# Patient Record
Sex: Female | Born: 1935
Health system: Southern US, Community
[De-identification: ages and names within clinical notes are randomized; demographics above are authoritative.]

## PROBLEM LIST (undated history)

## (undated) DIAGNOSIS — N12 Tubulo-interstitial nephritis, not specified as acute or chronic: Secondary | ICD-10-CM

## (undated) DIAGNOSIS — D472 Monoclonal gammopathy: Secondary | ICD-10-CM

## (undated) DIAGNOSIS — N189 Chronic kidney disease, unspecified: Secondary | ICD-10-CM

## (undated) DIAGNOSIS — R0600 Dyspnea, unspecified: Secondary | ICD-10-CM

## (undated) DIAGNOSIS — B1920 Unspecified viral hepatitis C without hepatic coma: Secondary | ICD-10-CM

## (undated) DIAGNOSIS — J449 Chronic obstructive pulmonary disease, unspecified: Secondary | ICD-10-CM

## (undated) DIAGNOSIS — D649 Anemia, unspecified: Secondary | ICD-10-CM

## (undated) DIAGNOSIS — E040 Nontoxic diffuse goiter: Secondary | ICD-10-CM

## (undated) DIAGNOSIS — Z923 Personal history of irradiation: Secondary | ICD-10-CM

## (undated) DIAGNOSIS — I6529 Occlusion and stenosis of unspecified carotid artery: Secondary | ICD-10-CM

## (undated) DIAGNOSIS — I10 Essential (primary) hypertension: Secondary | ICD-10-CM

## (undated) HISTORY — PX: ABDOMINAL HYSTERECTOMY: SHX81

## (undated) HISTORY — DX: Chronic kidney disease, unspecified: N18.9

---

## 2000-10-09 ENCOUNTER — Ambulatory Visit (HOSPITAL_COMMUNITY): Admission: RE | Admit: 2000-10-09 | Discharge: 2000-10-09 | Payer: Self-pay | Admitting: Internal Medicine

## 2000-10-10 ENCOUNTER — Encounter: Payer: Self-pay | Admitting: Internal Medicine

## 2000-11-01 ENCOUNTER — Ambulatory Visit (HOSPITAL_COMMUNITY): Admission: RE | Admit: 2000-11-01 | Discharge: 2000-11-01 | Payer: Self-pay | Admitting: Internal Medicine

## 2001-02-04 ENCOUNTER — Ambulatory Visit (HOSPITAL_COMMUNITY): Admission: RE | Admit: 2001-02-04 | Discharge: 2001-02-04 | Payer: Self-pay | Admitting: Internal Medicine

## 2001-02-04 ENCOUNTER — Encounter: Payer: Self-pay | Admitting: Internal Medicine

## 2001-05-03 ENCOUNTER — Ambulatory Visit (HOSPITAL_COMMUNITY): Admission: RE | Admit: 2001-05-03 | Discharge: 2001-05-03 | Payer: Self-pay | Admitting: Internal Medicine

## 2001-05-03 ENCOUNTER — Encounter: Payer: Self-pay | Admitting: Internal Medicine

## 2001-06-24 ENCOUNTER — Encounter: Payer: Self-pay | Admitting: Internal Medicine

## 2001-06-24 ENCOUNTER — Ambulatory Visit (HOSPITAL_COMMUNITY): Admission: RE | Admit: 2001-06-24 | Discharge: 2001-06-24 | Payer: Self-pay | Admitting: Internal Medicine

## 2002-05-07 ENCOUNTER — Ambulatory Visit (HOSPITAL_COMMUNITY): Admission: RE | Admit: 2002-05-07 | Discharge: 2002-05-07 | Payer: Self-pay | Admitting: Internal Medicine

## 2002-05-07 ENCOUNTER — Encounter: Payer: Self-pay | Admitting: Internal Medicine

## 2003-05-29 ENCOUNTER — Ambulatory Visit (HOSPITAL_COMMUNITY): Admission: RE | Admit: 2003-05-29 | Discharge: 2003-05-29 | Payer: Self-pay | Admitting: Internal Medicine

## 2003-08-31 ENCOUNTER — Ambulatory Visit (HOSPITAL_COMMUNITY): Admission: RE | Admit: 2003-08-31 | Discharge: 2003-08-31 | Payer: Self-pay | Admitting: Internal Medicine

## 2003-09-01 ENCOUNTER — Ambulatory Visit (HOSPITAL_COMMUNITY): Admission: RE | Admit: 2003-09-01 | Discharge: 2003-09-01 | Payer: Self-pay | Admitting: Internal Medicine

## 2004-05-23 ENCOUNTER — Ambulatory Visit (HOSPITAL_COMMUNITY): Admission: RE | Admit: 2004-05-23 | Discharge: 2004-05-23 | Payer: Self-pay | Admitting: Internal Medicine

## 2005-05-25 ENCOUNTER — Ambulatory Visit (HOSPITAL_COMMUNITY): Admission: RE | Admit: 2005-05-25 | Discharge: 2005-05-25 | Payer: Self-pay | Admitting: Internal Medicine

## 2006-05-28 ENCOUNTER — Ambulatory Visit (HOSPITAL_COMMUNITY): Admission: RE | Admit: 2006-05-28 | Discharge: 2006-05-28 | Payer: Self-pay | Admitting: Internal Medicine

## 2007-05-30 ENCOUNTER — Ambulatory Visit (HOSPITAL_COMMUNITY): Admission: RE | Admit: 2007-05-30 | Discharge: 2007-05-30 | Payer: Self-pay | Admitting: Internal Medicine

## 2007-11-18 ENCOUNTER — Ambulatory Visit (HOSPITAL_COMMUNITY): Admission: RE | Admit: 2007-11-18 | Discharge: 2007-11-18 | Payer: Self-pay | Admitting: Internal Medicine

## 2008-06-01 ENCOUNTER — Ambulatory Visit (HOSPITAL_COMMUNITY): Admission: RE | Admit: 2008-06-01 | Discharge: 2008-06-01 | Payer: Self-pay | Admitting: Internal Medicine

## 2008-12-22 ENCOUNTER — Ambulatory Visit (HOSPITAL_COMMUNITY): Admission: RE | Admit: 2008-12-22 | Discharge: 2008-12-22 | Payer: Self-pay | Admitting: Internal Medicine

## 2008-12-31 ENCOUNTER — Ambulatory Visit (HOSPITAL_COMMUNITY): Admission: RE | Admit: 2008-12-31 | Discharge: 2008-12-31 | Payer: Self-pay | Admitting: Cardiology

## 2009-06-02 ENCOUNTER — Ambulatory Visit (HOSPITAL_COMMUNITY): Admission: RE | Admit: 2009-06-02 | Discharge: 2009-06-02 | Payer: Self-pay | Admitting: Internal Medicine

## 2010-06-03 ENCOUNTER — Ambulatory Visit (HOSPITAL_COMMUNITY)
Admission: RE | Admit: 2010-06-03 | Discharge: 2010-06-03 | Payer: Self-pay | Source: Home / Self Care | Attending: Internal Medicine | Admitting: Internal Medicine

## 2010-07-03 ENCOUNTER — Encounter: Payer: Self-pay | Admitting: Internal Medicine

## 2010-10-28 NOTE — Procedures (Signed)
NAME:  Lisa [name], Lisa [name]                      ACCOUNT NO.:  [id number]   MEDICAL RECORD NO.:  [id number]                   PATIENT TYPE:  OUT   LOCATION:  RAD                                  FACILITY:  [facility]   PHYSICIAN:  Edward L. Luan Pulling, M.D.             DATE OF BIRTH:  15-Feb-1935   DATE OF PROCEDURE:  DATE OF DISCHARGE:  08/31/2003                              PULMONARY FUNCTION TEST   FINDINGS:  1. Spirometry shows a severe ventilatory defect with evidence of air-flow     obstruction.  2. Lung volumes show a moderate degree of restrictive change.  3. DLCO is severely reduced.  4. Arterial blood gasses are normal.  5. There is no significant bronchodilator effect.      ___________________________________________                                            Jasper Loser. Luan Pulling, M.D.   [facility]/MEDQ  D:  09/03/2003  T:  09/03/2003  Job:  [id number]   cc:   Tesfaye D. Legrand Rams, M.D.  [address]  [place]  [place] [address]  Fax: [phone]

## 2011-02-12 ENCOUNTER — Emergency Department (HOSPITAL_COMMUNITY)
Admission: EM | Admit: 2011-02-12 | Discharge: 2011-02-12 | Disposition: A | Discharge status: Home / Self Care | Payer: Medicare Other | Attending: Emergency Medicine | Admitting: Emergency Medicine

## 2011-02-12 ENCOUNTER — Emergency Department (HOSPITAL_COMMUNITY): Payer: Medicare Other

## 2011-02-12 DIAGNOSIS — J441 Chronic obstructive pulmonary disease with (acute) exacerbation: Secondary | ICD-10-CM | POA: Insufficient documentation

## 2011-02-12 DIAGNOSIS — I10 Essential (primary) hypertension: Secondary | ICD-10-CM | POA: Insufficient documentation

## 2011-02-12 DIAGNOSIS — F17200 Nicotine dependence, unspecified, uncomplicated: Secondary | ICD-10-CM | POA: Insufficient documentation

## 2011-02-12 HISTORY — DX: Chronic obstructive pulmonary disease, unspecified: J44.9

## 2011-02-12 HISTORY — DX: Essential (primary) hypertension: I10

## 2011-02-12 MED ORDER — PREDNISONE 10 MG PO TABS: 20.0000 mg | ORAL_TABLET | Freq: Every day | ORAL | Status: AC

## 2011-02-12 MED ORDER — IPRATROPIUM BROMIDE 0.02 % IN SOLN
0.5000 mg | Freq: Once | RESPIRATORY_TRACT | Status: AC
  Administered 2011-02-12: 0.5 mg via RESPIRATORY_TRACT
  Filled 2011-02-12: qty 2.5

## 2011-02-12 MED ORDER — PREDNISONE 20 MG PO TABS
60.0000 mg | ORAL_TABLET | Freq: Once | ORAL | Status: AC
  Administered 2011-02-12: 60 mg via ORAL
  Filled 2011-02-12: qty 3
  Filled 2011-02-12: qty 1

## 2011-02-12 MED ORDER — ALBUTEROL SULFATE (5 MG/ML) 0.5% IN NEBU
2.5000 mg | INHALATION_SOLUTION | Freq: Once | RESPIRATORY_TRACT | Status: AC
  Administered 2011-02-12: 2.5 mg via RESPIRATORY_TRACT
  Filled 2011-02-12: qty 0.5

## 2011-02-12 MED ORDER — ALBUTEROL SULFATE HFA 108 (90 BASE) MCG/ACT IN AERS
2.0000 | INHALATION_SPRAY | Freq: Once | RESPIRATORY_TRACT | Status: AC
  Administered 2011-02-12: 2 via RESPIRATORY_TRACT
  Filled 2011-02-12: qty 6.7

## 2011-02-12 NOTE — ED Notes (Signed)
Notified nurse of bp

## 2011-02-12 NOTE — ED Notes (Signed)
Pt states she has copd. Has been sob x 3 days. Worse today

## 2011-02-12 NOTE — ED Notes (Signed)
C/o SOA with even the slightest of effort such as ambulating slowly.  Began several days ago with "cold like" symptoms.  Respirations are nonlabored--expiratory wheezes posteriorly both lung fields---No swelling of lower extremities

## 2011-02-12 NOTE — ED Provider Notes (Signed)
Scribed for Mervin Kung, MD, the patient was seen in room [facility]07/[facility]07. This chart was scribed by [employer]. The patient's care started at 10:47  CSN: [id number] Arrival date & time: 02/12/2011 10:40 AM  Chief Complaint  Patient presents with  . Shortness of Breath   HPI Lisa [name] is a 76 y.o. female with a history of HTN and COPD, who presents to the Emergency Department complaining of worsening shortness of breath, onset 02/09/2011. Reports non productive cough, questionable fevers and resolved headache. Denies nausea, vomiting, chest pain, vomiting, rash or swelling in legs. There are no other associated symptoms and no other alleviating or aggravating factors.  PCP is Dr Legrand Rams  HPI ELEMENTS:  Onset: 02/09/2011 Duration: 3 days  Timing: constant Quality: worsening  Modifying factors: aggravated by activity Context:  as above  Associated symptoms:    Past Medical History  Diagnosis Date  . COPD (chronic obstructive pulmonary disease)   . Hypertension    MEDICATIONS:  Previous Medications   No medications on file     ALLERGIES:  Allergies as of 02/12/2011  . (No Known Allergies)      History reviewed. No pertinent past surgical history.  No family history on file.  History  Substance Use Topics  . Smoking status: Current Everyday Smoker  . Smokeless tobacco: Not on file  . Alcohol Use: No      Review of Systems  Physical Exam  BP 231/89  Temp(Src) 98.6 F (37 C) (Oral)  Resp 32  Ht 5\' 8"  (1.727 m)  Wt 135 lb (61.236 kg)  BMI 20.53 kg/m2  SpO2 99%  Physical Exam  Constitutional: She is oriented to person, place, and time. She appears well-developed and well-nourished. No distress.  HENT:  Head: Normocephalic and atraumatic.  Mouth/Throat: Oropharynx is clear and moist. No oropharyngeal exudate.  Eyes: Conjunctivae and EOM are normal. Pupils are equal, round, and reactive to light.  Neck: Normal range of motion.  Cardiovascular:  Normal rate, regular rhythm and normal heart sounds.   No murmur heard. Pulmonary/Chest: Effort normal. No respiratory distress. She has wheezes (moderate bilateral expitory). She has no rales.  Abdominal: Soft. Bowel sounds are normal. She exhibits no distension. There is no tenderness. There is no rebound and no guarding.  Musculoskeletal: Normal range of motion. She exhibits no edema and no tenderness.  Neurological: She is alert and oriented to person, place, and time. No cranial nerve deficit. Coordination normal.  Skin: Skin is warm and dry. No rash noted. No erythema.  Psychiatric: She has a normal mood and affect. Her behavior is normal.    ED Course  Procedures  OTHER DATA REVIEWED: Nursing notes, vital signs, and past medical records reviewed.    DIAGNOSTIC STUDIES: Oxygen Saturation is 99% on room air, normal by my interpretation.    LABS / RADIOLOGY:  Dg Chest 2 View  02/12/2011  *RADIOLOGY REPORT*  Clinical Data: Shortness of breath.  CHEST - 2 VIEW  Comparison: Chest x-ray 11/18/2007.  Findings: The cardiac silhouette, mediastinal and hilar contours are within normal limits and stable.  The lungs demonstrate mild emphysematous changes but no acute pulmonary findings.  No pleural effusion.  The bony thorax is intact.  IMPRESSION: Stable mild emphysematous changes but no acute pulmonary findings.  Original Report Authenticated By: P. Kalman Jewels, M.D.    ED COURSE / COORDINATION OF CARE: 12:40 - EDMD examined patient and ordered the following Orders Placed This Encounter  Procedures  . DG  Chest 2 View  14:59 - EDMD rechecked patient. Patient is significantly improved after breathing treatment. Wheezing is significantly reduced. Lab results discussed and discharge instructions given.  MDM:   WHEEZING IMPROVED SIG WITH NEBULIZER TX IN ED ALSO PREDNISONE STARTED. PATIENT FEELS READY TO GO HOME. CXR NEG FOR PNEUMONIA.   IMPRESSION: Diagnoses that have been ruled out:    Diagnoses that are still under consideration:  Final diagnoses:    PLAN:  Home The patient is to return the emergency department if there is any worsening of symptoms. I have reviewed the discharge instructions with the PATIENT   CONDITION ON DISCHARGE: IMPROVED.   MEDICATIONS GIVEN IN THE E.D. Medications - No data to display  DISCHARGE MEDICATIONS: New Prescriptions   No medications on file    SCRIBE ATTESTATION:   I personally performed the services described in this documentation, which was scribed in my presence. The recorded information has been reviewed and considered. Mervin Kung, MD       Mervin Kung, MD 02/12/11 [phone]

## 2011-02-12 NOTE — ED Notes (Addendum)
Dr. Rogene Houston notified of B/P 210/103--Pt. Asymptomatic--states she hasn't taken her b/p RX today.

## 2011-02-12 NOTE — ED Notes (Signed)
Assisted up to bathroom---became very SOA--back to carrier via wheelchair--Pulse ox 90%--placed on 02 at 2 liters---02 level came back up to high 90's.

## 2011-05-10 ENCOUNTER — Other Ambulatory Visit (HOSPITAL_COMMUNITY): Payer: Self-pay | Admitting: Internal Medicine

## 2011-05-10 DIAGNOSIS — Z139 Encounter for screening, unspecified: Secondary | ICD-10-CM

## 2011-06-08 ENCOUNTER — Ambulatory Visit (HOSPITAL_COMMUNITY)
Admission: RE | Admit: 2011-06-08 | Discharge: 2011-06-08 | Disposition: A | Discharge status: Home / Self Care | Payer: Medicare Other | Source: Ambulatory Visit | Attending: Internal Medicine | Admitting: Internal Medicine

## 2011-06-08 DIAGNOSIS — Z139 Encounter for screening, unspecified: Secondary | ICD-10-CM

## 2011-06-08 DIAGNOSIS — Z1231 Encounter for screening mammogram for malignant neoplasm of breast: Secondary | ICD-10-CM | POA: Insufficient documentation

## 2011-08-11 ENCOUNTER — Emergency Department (HOSPITAL_COMMUNITY)
Admission: EM | Admit: 2011-08-11 | Discharge: 2011-08-11 | Disposition: A | Discharge status: Home / Self Care | Payer: Medicare Other | Attending: Emergency Medicine | Admitting: Emergency Medicine

## 2011-08-11 ENCOUNTER — Other Ambulatory Visit: Payer: Self-pay

## 2011-08-11 ENCOUNTER — Emergency Department (HOSPITAL_COMMUNITY): Payer: Medicare Other

## 2011-08-11 ENCOUNTER — Encounter (HOSPITAL_COMMUNITY): Payer: Self-pay | Admitting: *Deleted

## 2011-08-11 DIAGNOSIS — F17200 Nicotine dependence, unspecified, uncomplicated: Secondary | ICD-10-CM | POA: Insufficient documentation

## 2011-08-11 DIAGNOSIS — R05 Cough: Secondary | ICD-10-CM | POA: Insufficient documentation

## 2011-08-11 DIAGNOSIS — I10 Essential (primary) hypertension: Secondary | ICD-10-CM | POA: Insufficient documentation

## 2011-08-11 DIAGNOSIS — R0602 Shortness of breath: Secondary | ICD-10-CM | POA: Insufficient documentation

## 2011-08-11 DIAGNOSIS — R059 Cough, unspecified: Secondary | ICD-10-CM | POA: Insufficient documentation

## 2011-08-11 DIAGNOSIS — J441 Chronic obstructive pulmonary disease with (acute) exacerbation: Secondary | ICD-10-CM | POA: Insufficient documentation

## 2011-08-11 DIAGNOSIS — Z79899 Other long term (current) drug therapy: Secondary | ICD-10-CM | POA: Insufficient documentation

## 2011-08-11 LAB — DIFFERENTIAL
Eosinophils Relative: 0 % (ref 0–5)
Lymphocytes Relative: 22 % (ref 12–46)
Monocytes Absolute: 0.4 10*3/uL (ref 0.1–1.0)
Monocytes Relative: 6 % (ref 3–12)
Neutro Abs: 4.5 10*3/uL (ref 1.7–7.7)

## 2011-08-11 LAB — CBC
HCT: 39.5 % (ref 36.0–46.0)
Hemoglobin: 13.3 g/dL (ref 12.0–15.0)
MCHC: 33.7 g/dL (ref 30.0–36.0)
MCV: 88 fL (ref 78.0–100.0)
RDW: 13.4 % (ref 11.5–15.5)
WBC: 6.3 10*3/uL (ref 4.0–10.5)

## 2011-08-11 LAB — TROPONIN I: Troponin I: <0.3 ng/mL (ref <0.30)

## 2011-08-11 LAB — BASIC METABOLIC PANEL
BUN: 23 mg/dL (ref 6–23)
CO2: 28 mEq/L (ref 19–32)
Chloride: 104 mEq/L (ref 96–112)
Creatinine, Ser: 0.83 mg/dL (ref 0.50–1.10)
GFR calc Af Amer: 79 mL/min — ABNORMAL LOW (ref >90)
Potassium: 3.3 mEq/L — ABNORMAL LOW (ref 3.5–5.1)

## 2011-08-11 MED ORDER — LOSARTAN POTASSIUM-HCTZ 100-25 MG PO TABS: 1.0000 | ORAL_TABLET | Freq: Every day | ORAL | Status: DC

## 2011-08-11 MED ORDER — IPRATROPIUM BROMIDE 0.02 % IN SOLN
1.0000 mg | Freq: Once | RESPIRATORY_TRACT | Status: AC
  Administered 2011-08-11: 1 mg via RESPIRATORY_TRACT
  Filled 2011-08-11 (×2): qty 2.5

## 2011-08-11 MED ORDER — ALBUTEROL SULFATE (5 MG/ML) 0.5% IN NEBU
10.0000 mg | INHALATION_SOLUTION | Freq: Once | RESPIRATORY_TRACT | Status: AC
  Administered 2011-08-11: 10 mg via RESPIRATORY_TRACT
  Filled 2011-08-11: qty 2

## 2011-08-11 MED ORDER — LOSARTAN POTASSIUM 50 MG PO TABS
100.0000 mg | ORAL_TABLET | Freq: Once | ORAL | Status: AC
  Administered 2011-08-11: 100 mg via ORAL
  Filled 2011-08-11: qty 2

## 2011-08-11 MED ORDER — HYDROCHLOROTHIAZIDE 25 MG PO TABS
25.0000 mg | ORAL_TABLET | Freq: Once | ORAL | Status: AC
  Administered 2011-08-11: 25 mg via ORAL
  Filled 2011-08-11: qty 1

## 2011-08-11 MED ORDER — ALBUTEROL SULFATE HFA 108 (90 BASE) MCG/ACT IN AERS: 1.0000 | INHALATION_SPRAY | Freq: Four times a day (QID) | RESPIRATORY_TRACT | Status: DC | PRN

## 2011-08-11 MED ORDER — ALBUTEROL SULFATE (5 MG/ML) 0.5% IN NEBU
5.0000 mg | INHALATION_SOLUTION | Freq: Once | RESPIRATORY_TRACT | Status: AC
  Administered 2011-08-11: 5 mg via RESPIRATORY_TRACT
  Filled 2011-08-11: qty 1

## 2011-08-11 MED ORDER — ALBUTEROL SULFATE (5 MG/ML) 0.5% IN NEBU: 2.5000 mg | INHALATION_SOLUTION | Freq: Once | RESPIRATORY_TRACT | Status: DC

## 2011-08-11 MED ORDER — PREDNISONE 50 MG PO TABS: 50.0000 mg | ORAL_TABLET | Freq: Every day | ORAL | Status: AC

## 2011-08-11 MED ORDER — SODIUM CHLORIDE 0.9 % IV SOLN
INTRAVENOUS | Status: DC
  Administered 2011-08-11: 500 mL via INTRAVENOUS

## 2011-08-11 MED ORDER — METHYLPREDNISOLONE SODIUM SUCC 125 MG IJ SOLR: 125.0000 mg | Freq: Once | INTRAMUSCULAR | Status: DC

## 2011-08-11 NOTE — ED Notes (Signed)
Patient with no complaints at this time. Respirations even and unlabored. Skin warm/dry. Discharge instructions reviewed with patient at this time. Patient given opportunity to voice concerns/ask questions. IV removed per policy and band-aid applied to site. Patient discharged at this time and left Emergency Department via wheelchair.  

## 2011-08-11 NOTE — ED Notes (Signed)
Patient seen by pcp on Tuesday for shortness of breath. Was given zpack. States that shortness of breath has gotten worse.

## 2011-08-11 NOTE — ED Provider Notes (Signed)
Patient turned over to me from Dr. Thurnell Garbe. Patient was treated with albuterol.  Patient is feeling better now after treatments.  Physical Exam  BP 209/85  Pulse 70  Resp 20  Ht 5\' 8"  (1.727 m)  Wt 135 lb (61.236 kg)  BMI 20.53 kg/m2  SpO2 100%  Physical Exam On my exam her lungs are essentially clear to auscultation with a very faint wheeze at end expiration. She is able to speak in full sentences without any difficulty ED Course  Procedures  MDM patient is feeling better now after treatments. She'll be given a dose of his blood pressure medications. Patient also be discharged home on a course of steroids.     Kathalene Frames, MD 08/11/11 [phone]

## 2011-08-11 NOTE — ED Provider Notes (Signed)
History     CSN: [id number]  Arrival date & time 08/11/11  0456   First MD Initiated Contact with Patient 08/11/11 [phone]      Chief Complaint  Patient presents with  . Shortness of Breath    The history is provided by the patient, the EMS personnel and a relative. History Limited By: urgent need for intervention.  Pt was seen at 0525.  Per pt, family and EMS, pt c/o gradual onset and worsening of persistent SOB, wheezing and cough x3 days.  Describes her symptoms as "my COPD."  Pt was eval by her PMD 2 days ago for same, given z-pak for her cough.  Pt states she has continued to use her home MDI and nebs without relief.  EMS gave nebs and IV solumedrol en route with partial relief of symptoms.  Pt endorses she has not taken any of her meds today.  Denies CP/palpitations, no fevers, no back pain, no N/V/D, no abd pain.    PMD:  Dr. Legrand Rams Past Medical History  Diagnosis Date  . COPD (chronic obstructive pulmonary disease)   . Hypertension     Past Surgical History  Procedure Date  . Abdominal hysterectomy     History  Substance Use Topics  . Smoking status: Current Everyday Smoker    Types: Cigarettes  . Smokeless tobacco: Not on file  . Alcohol Use: No    Review of Systems  Unable to perform ROS: Other    Allergies  Review of patient's allergies indicates no known allergies.  Home Medications   Current Outpatient Rx  Name Route Sig Dispense Refill  . IPRATROPIUM-ALBUTEROL 18-103 MCG/ACT IN AERO Inhalation Inhale 2 puffs into the lungs 4 (four) times daily.    Marland Kitchen DILTIAZEM HCL ER COATED BEADS 120 MG PO CP24 Oral Take 120 mg by mouth daily.      . IPRATROPIUM-ALBUTEROL 0.5-2.5 (3) MG/3ML IN SOLN Nebulization Take 3 mLs by nebulization 4 (four) times daily.    Marland Kitchen LABETALOL HCL 300 MG PO TABS Oral Take 300 mg by mouth 2 (two) times daily.      Marland Kitchen LOSARTAN POTASSIUM-HCTZ 100-25 MG PO TABS Oral Take 1 tablet by mouth daily.      Marland Kitchen SIMVASTATIN 20 MG PO TABS Oral Take 20 mg by  mouth daily.    . IPRATROPIUM BROMIDE 0.02 % IN SOLN Nebulization Take 500 mcg by nebulization every 4 (four) hours as needed. FOR SHORTNESS OF BREATH        BP 244/117  Pulse 77  Resp 32  Ht 5\' 8"  (1.727 m)  Wt 135 lb (61.236 kg)  BMI 20.53 kg/m2  SpO2 100%  Physical Exam 0530: Physical examination:  Nursing notes reviewed; Vital signs and O2 SAT reviewed;  Constitutional: Well developed, Well nourished, Uncomfortable appearing; Head:  Normocephalic, atraumatic; Eyes: EOMI, PERRL, No scleral icterus; ENMT: Mouth and pharynx normal, Mucous membranes dry; Neck: Supple, Full range of motion, No lymphadenopathy; Cardiovascular: Regular rate and rhythm, No murmur, rub, or gallop; Respiratory: Breath sounds diminished & equal bilaterally with scattered insp/exp wheezes bilat, +audible wheezing, speaking in long phrases, sitting upright, Normal respiratory effort/excursion; Chest: Nontender, Movement normal; Abdomen: Soft, Nontender, Nondistended, Normal bowel sounds; Extremities: Pulses normal, No tenderness, No edema, No calf edema or asymmetry.; Neuro: AA&Ox3, Major CN grossly intact. No facial droop, speech clear.  No gross focal motor deficits in extremities.; Skin: Color normal, Warm, Dry, no rash.    ED Course  Procedures    MDM  MDM Reviewed: nursing note, vitals and previous chart Interpretation: ECG, x-ray and labs    Date: 08/11/2011  Rate: 76  Rhythm: normal sinus rhythm  QRS Axis: normal  Intervals: normal  ST/T Wave abnormalities: nonspecific ST/T changes inf-lat leads, artifact  Conduction Disutrbances:none  Narrative Interpretation:  [facility]  Old EKG Reviewed: none available    Dg Chest Portable 1 View 08/11/2011  *RADIOLOGY REPORT*  Clinical Data: Shortness of breath.  PORTABLE CHEST - 1 VIEW  Comparison: Chest radiograph performed 02/12/2011  Findings: The lungs are well-aerated and clear.  There is no evidence of focal opacification, pleural effusion or pneumothorax.  Pulmonary vascularity is at the upper limits of normal.  The cardiomediastinal silhouette is within normal limits.  No acute osseous abnormalities are seen.  IMPRESSION: No acute cardiopulmonary process seen.  Original Report Authenticated By: Santa Lighter, M.D.   Results for orders placed during the hospital encounter of 08/11/11  TROPONIN I      Component Value Range   Troponin I <0.30  <0.30 (ng/mL)  PRO B NATRIURETIC PEPTIDE      Component Value Range   Pro B Natriuretic peptide (BNP) 750.0 (*) 0 - 125 (pg/mL)  BASIC METABOLIC PANEL      Component Value Range   Sodium 141  135 - 145 (mEq/L)   Potassium 3.3 (*) 3.5 - 5.1 (mEq/L)   Chloride 104  96 - 112 (mEq/L)   CO2 28  19 - 32 (mEq/L)   Glucose, Bld 109 (*) 70 - 99 (mg/dL)   BUN 23  6 - 23 (mg/dL)   Creatinine, Ser 0.83  0.50 - 1.10 (mg/dL)   Calcium 9.4  8.4 - 10.5 (mg/dL)   GFR calc non Af Amer 68 (*) >90 (mL/min)   GFR calc Af Amer 79 (*) >90 (mL/min)  CBC      Component Value Range   WBC 6.3  4.0 - 10.5 (K/uL)   RBC 4.49  3.87 - 5.11 (MIL/uL)   Hemoglobin 13.3  12.0 - 15.0 (g/dL)   HCT 39.5  36.0 - 46.0 (%)   MCV 88.0  78.0 - 100.0 (fL)   MCH 29.6  26.0 - 34.0 (pg)   MCHC 33.7  30.0 - 36.0 (g/dL)   RDW 13.4  11.5 - 15.5 (%)   Platelets 140 (*) 150 - 400 (K/uL)  DIFFERENTIAL      Component Value Range   Neutrophils Relative 71  43 - 77 (%)   Neutro Abs 4.5  1.7 - 7.7 (K/uL)   Lymphocytes Relative 22  12 - 46 (%)   Lymphs Abs 1.4  0.7 - 4.0 (K/uL)   Monocytes Relative 6  3 - 12 (%)   Monocytes Absolute 0.4  0.1 - 1.0 (K/uL)   Eosinophils Relative 0  0 - 5 (%)   Eosinophils Absolute 0.0  0.0 - 0.7 (K/uL)   Basophils Relative 0  0 - 1 (%)   Basophils Absolute 0.0  0.0 - 0.1 (K/uL)  LACTIC ACID, PLASMA      Component Value Range   Lactic Acid, Venous 0.8  0.5 - 2.2 (mmol/L)  PROCALCITONIN      Component Value Range   Procalcitonin <0.10      7:30 AM:  Pt's continuous neb in place.  States she is starting to  "feel better."  Lungs continue with insp and exp wheezes bilat, no further audible wheezing.  Continues to sit upright, speaking in sentences.  Requesting her morning BP meds.  Will  need re-eval after neb regarding d/c vs admit.  Dx testing d/w pt and family.  Questions answered.  Verb understanding.  Sign out to Dr. Tomi Bamberger.        Alfonzo Feller, DO 08/11/11 1529

## 2011-08-11 NOTE — ED Notes (Signed)
Pt provided a bedside commode. Assisted pt to commode.

## 2011-08-11 NOTE — Discharge Instructions (Signed)
Arterial Hypertension Arterial hypertension (high blood pressure) is a condition of elevated pressure in your blood vessels. Hypertension over a long period of time is a risk factor for strokes, heart attacks, and heart failure. It is also the leading cause of kidney (renal) failure.  CAUSES   In Adults -- Over 90% of all hypertension has no known cause. This is called essential or primary hypertension. In the other 10% of people with hypertension, the increase in blood pressure is caused by another disorder. This is called secondary hypertension. Important causes of secondary hypertension are:   Heavy alcohol use.   Obstructive sleep apnea.   Hyperaldosterosim (Conn's syndrome).   Steroid use.   Chronic kidney failure.   Hyperparathyroidism.   Medications.   Renal artery stenosis.   Pheochromocytoma.   Cushing's disease.   Coarctation of the aorta.   Scleroderma renal crisis.   Licorice (in excessive amounts).   Drugs (cocaine, methamphetamine).  Your caregiver can explain any items above that apply to you.  In Children -- Secondary hypertension is more common and should always be considered.   Pregnancy -- Few women of childbearing age have high blood pressure. However, up to 10% of them develop hypertension of pregnancy. Generally, this will not harm the woman. It may be a sign of 3 complications of pregnancy: preeclampsia, HELLP syndrome, and eclampsia. Follow up and control with medication is necessary.  SYMPTOMS   This condition normally does not produce any noticeable symptoms. It is usually found during a routine exam.   Malignant hypertension is a late problem of high blood pressure. It may have the following symptoms:   Headaches.   Blurred vision.   End-organ damage (this means your kidneys, heart, lungs, and other organs are being damaged).   Stressful situations can increase the blood pressure. If a person with normal blood pressure has their blood  pressure go up while being seen by their caregiver, this is often termed "white coat hypertension." Its importance is not known. It may be related with eventually developing hypertension or complications of hypertension.   Hypertension is often confused with mental tension, stress, and anxiety.  DIAGNOSIS  The diagnosis is made by 3 separate blood pressure measurements. They are taken at least 1 week apart from each other. If there is organ damage from hypertension, the diagnosis may be made without repeat measurements. Hypertension is usually identified by having blood pressure readings:  Above 140/90 mmHg measured in both arms, at 3 separate times, over a couple weeks.   Over 130/80 mmHg should be considered a risk factor and may require treatment in patients with diabetes.  Blood pressure readings over 120/80 mmHg are called "pre-hypertension" even in non-diabetic patients. To get a true blood pressure measurement, use the following guidelines. Be aware of the factors that can alter blood pressure readings.  Take measurements at least 1 hour after caffeine.   Take measurements 30 minutes after smoking and without any stress. This is another reason to quit smoking - it raises your blood pressure.   Use a proper cuff size. Ask your caregiver if you are not sure about your cuff size.   Most home blood pressure cuffs are automatic. They will measure systolic and diastolic pressures. The systolic pressure is the pressure reading at the start of sounds. Diastolic pressure is the pressure at which the sounds disappear. If you are elderly, measure pressures in multiple postures. Try sitting, lying or standing.   Sit at rest for a minimum of   5 minutes before taking measurements.   You should not be on any medications like decongestants. These are found in many cold medications.   Record your blood pressure readings and review them with your caregiver.  If you have hypertension:  Your caregiver  may do tests to be sure you do not have secondary hypertension (see "causes" above).   Your caregiver may also look for signs of metabolic syndrome. This is also called Syndrome X or Insulin Resistance Syndrome. You may have this syndrome if you have type 2 diabetes, abdominal obesity, and abnormal blood lipids in addition to hypertension.   Your caregiver will take your medical and family history and perform a physical exam.   Diagnostic tests may include blood tests (for glucose, cholesterol, potassium, and kidney function), a urinalysis, or an EKG. Other tests may also be necessary depending on your condition.  PREVENTION  There are important lifestyle issues that you can adopt to reduce your chance of developing hypertension:  Maintain a normal weight.   Limit the amount of salt (sodium) in your diet.   Exercise often.   Limit alcohol intake.   Get enough potassium in your diet. Discuss specific advice with your caregiver.   Follow a DASH diet (dietary approaches to stop hypertension). This diet is rich in fruits, vegetables, and low-fat dairy products, and avoids certain fats.  PROGNOSIS  Essential hypertension cannot be cured. Lifestyle changes and medical treatment can lower blood pressure and reduce complications. The prognosis of secondary hypertension depends on the underlying cause. Many people whose hypertension is controlled with medicine or lifestyle changes can live a normal, healthy life.  RISKS AND COMPLICATIONS  While high blood pressure alone is not an illness, it often requires treatment due to its short- and long-term effects on many organs. Hypertension increases your risk for:  CVAs or strokes (cerebrovascular accident).   Heart failure due to chronically high blood pressure (hypertensive cardiomyopathy).   Heart attack (myocardial infarction).   Damage to the retina (hypertensive retinopathy).   Kidney failure (hypertensive nephropathy).  Your caregiver can  explain list items above that apply to you. Treatment of hypertension can significantly reduce the risk of complications. TREATMENT   For overweight patients, weight loss and regular exercise are recommended. Physical fitness lowers blood pressure.   Mild hypertension is usually treated with diet and exercise. A diet rich in fruits and vegetables, fat-free dairy products, and foods low in fat and salt (sodium) can help lower blood pressure. Decreasing salt intake decreases blood pressure in a 1/3 of people.   Stop smoking if you are a smoker.  The steps above are highly effective in reducing blood pressure. While these actions are easy to suggest, they are difficult to achieve. Most patients with moderate or severe hypertension end up requiring medications to bring their blood pressure down to a normal level. There are several classes of medications for treatment. Blood pressure pills (antihypertensives) will lower blood pressure by their different actions. Lowering the blood pressure by 10 mmHg may decrease the risk of complications by as much as 25%. The goal of treatment is effective blood pressure control. This will reduce your risk for complications. Your caregiver will help you determine the best treatment for you according to your lifestyle. What is excellent treatment for one person, may not be for you. HOME CARE INSTRUCTIONS   Do not smoke.   Follow the lifestyle changes outlined in the "Prevention" section.   If you are on medications, follow the directions   carefully. Blood pressure medications must be taken as prescribed. Skipping doses reduces their benefit. It also puts you at risk for problems.   Follow up with your caregiver, as directed.   If you are asked to monitor your blood pressure at home, follow the guidelines in the "Diagnosis" section above.  SEEK MEDICAL CARE IF:   You think you are having medication side effects.   You have recurrent headaches or lightheadedness.     You have swelling in your ankles.   You have trouble with your vision.  SEEK IMMEDIATE MEDICAL CARE IF:   You have sudden onset of chest pain or pressure, difficulty breathing, or other symptoms of a heart attack.   You have a severe headache.   You have symptoms of a stroke (such as sudden weakness, difficulty speaking, difficulty walking).  MAKE SURE YOU:   Understand these instructions.   Will watch your condition.   Will get help right away if you are not doing well or get worse.  Document Released: 05/29/2005 Document Revised: 02/08/2011 Document Reviewed: 12/27/2006 [facility] Patient Information 2012 [place].Chronic Obstructive Pulmonary Disease Chronic obstructive pulmonary disease (COPD) is a condition in which airflow from the lungs is restricted. The lungs can never return to normal, but there are measures you can take which will improve them and make you feel better. CAUSES   Smoking.   Exposure to secondhand smoke.   Breathing in irritants (pollution, cigarette smoke, strong smells, aerosol sprays, paint fumes).   History of lung infections.  TREATMENT  Treatment focuses on making you comfortable (supportive care). Your caregiver may prescribe medications (inhaled or pills) to help improve your breathing. HOME CARE INSTRUCTIONS   If you smoke, stop smoking.   Avoid exposure to smoke, chemicals, and fumes that aggravate your breathing.   Take antibiotic medicines as directed by your caregiver.   Avoid medicines that dry up your system and slow down the elimination of secretions (antihistamines and cough syrups). This decreases respiratory capacity and may lead to infections.   Drink enough water and fluids to keep your urine clear or pale yellow. This loosens secretions.   Use humidifiers at home and at your bedside if they do not make breathing difficult.   Receive all protective vaccines your caregiver suggests, especially pneumococcal and  influenza.   Use home oxygen as suggested.   Stay active. Exercise and physical activity will help maintain your ability to do things you want to do.   Eat a healthy diet.  SEEK MEDICAL CARE IF:   You develop pus-like mucus (sputum).   Breathing is more labored or exercise becomes difficult to do.   You are running out of the medicine you take for your breathing.  SEEK IMMEDIATE MEDICAL CARE IF:   You have a rapid heart rate.   You have agitation, confusion, tremors, or are in a stupor (family members may need to observe this).   It becomes difficult to breathe.   You develop chest pain.   You have a fever.  MAKE SURE YOU:   Understand these instructions.   Will watch your condition.   Will get help right away if you are not doing well or get worse.  Document Released: 03/08/2005 Document Revised: 02/08/2011 Document Reviewed: 07/29/2010 [facility] Patient Information 2012 [place].

## 2011-08-11 NOTE — ED Notes (Signed)
Ambulation Trial:  Patient ambulated around hallway with ED tech with continuous pulse oximetry. O2 saturations remained 97 % on RA during ambulation. Patient tolerated ambulation well. Returned to room and placed back on monitor.

## 2011-08-11 NOTE — ED Notes (Signed)
Pt reports she feels a little better now, breathing improved

## 2011-08-11 NOTE — ED Notes (Signed)
Hour long neb completed. Patient placed on 2 liters nasal canula. O2 sats 100%. Denies any other needs at this time.

## 2011-08-11 NOTE — ED Notes (Signed)
Pharmacy contacted to send BP medication to ED. Awaiting med at this time.

## 2012-03-20 ENCOUNTER — Telehealth: Payer: Self-pay

## 2012-03-20 NOTE — Telephone Encounter (Signed)
LMOM to call.

## 2012-04-04 NOTE — Telephone Encounter (Signed)
Called. Many rings and no answer.  

## 2012-04-22 NOTE — Telephone Encounter (Signed)
Mailing another letter to call and letter to PCP.

## 2012-08-09 ENCOUNTER — Other Ambulatory Visit (HOSPITAL_COMMUNITY): Payer: Self-pay | Admitting: Internal Medicine

## 2012-08-09 ENCOUNTER — Ambulatory Visit (HOSPITAL_COMMUNITY)
Admission: RE | Admit: 2012-08-09 | Discharge: 2012-08-09 | Disposition: A | Discharge status: Home / Self Care | Payer: Medicare Other | Source: Ambulatory Visit | Attending: Internal Medicine | Admitting: Internal Medicine

## 2012-08-09 DIAGNOSIS — J4489 Other specified chronic obstructive pulmonary disease: Secondary | ICD-10-CM | POA: Insufficient documentation

## 2012-08-09 DIAGNOSIS — R42 Dizziness and giddiness: Secondary | ICD-10-CM | POA: Insufficient documentation

## 2012-08-12 ENCOUNTER — Other Ambulatory Visit: Payer: Self-pay

## 2012-08-12 ENCOUNTER — Ambulatory Visit (HOSPITAL_COMMUNITY)
Admission: RE | Admit: 2012-08-12 | Discharge: 2012-08-12 | Disposition: A | Discharge status: Home / Self Care | Payer: Medicare Other | Source: Ambulatory Visit | Attending: Internal Medicine | Admitting: Internal Medicine

## 2012-08-12 DIAGNOSIS — R0989 Other specified symptoms and signs involving the circulatory and respiratory systems: Secondary | ICD-10-CM | POA: Insufficient documentation

## 2012-08-12 DIAGNOSIS — R0609 Other forms of dyspnea: Secondary | ICD-10-CM | POA: Insufficient documentation

## 2012-08-12 DIAGNOSIS — R42 Dizziness and giddiness: Secondary | ICD-10-CM | POA: Insufficient documentation

## 2012-09-16 ENCOUNTER — Other Ambulatory Visit: Payer: Self-pay

## 2012-10-08 ENCOUNTER — Encounter (HOSPITAL_COMMUNITY): Payer: Self-pay

## 2012-10-08 ENCOUNTER — Emergency Department (HOSPITAL_COMMUNITY)
Admission: EM | Admit: 2012-10-08 | Discharge: 2012-10-08 | Disposition: A | Discharge status: Home / Self Care | Payer: Medicare Other | Attending: Emergency Medicine | Admitting: Emergency Medicine

## 2012-10-08 ENCOUNTER — Emergency Department (HOSPITAL_COMMUNITY): Payer: Medicare Other

## 2012-10-08 DIAGNOSIS — J441 Chronic obstructive pulmonary disease with (acute) exacerbation: Secondary | ICD-10-CM | POA: Insufficient documentation

## 2012-10-08 DIAGNOSIS — J449 Chronic obstructive pulmonary disease, unspecified: Secondary | ICD-10-CM

## 2012-10-08 DIAGNOSIS — J3489 Other specified disorders of nose and nasal sinuses: Secondary | ICD-10-CM | POA: Insufficient documentation

## 2012-10-08 DIAGNOSIS — R0789 Other chest pain: Secondary | ICD-10-CM | POA: Insufficient documentation

## 2012-10-08 DIAGNOSIS — Z79899 Other long term (current) drug therapy: Secondary | ICD-10-CM | POA: Insufficient documentation

## 2012-10-08 DIAGNOSIS — J40 Bronchitis, not specified as acute or chronic: Secondary | ICD-10-CM

## 2012-10-08 DIAGNOSIS — F17200 Nicotine dependence, unspecified, uncomplicated: Secondary | ICD-10-CM | POA: Insufficient documentation

## 2012-10-08 DIAGNOSIS — I10 Essential (primary) hypertension: Secondary | ICD-10-CM | POA: Insufficient documentation

## 2012-10-08 LAB — CBC WITH DIFFERENTIAL/PLATELET
Eosinophils Absolute: 0 10*3/uL (ref 0.0–0.7)
Eosinophils Relative: 0 % (ref 0–5)
HCT: 39.6 % (ref 36.0–46.0)
Hemoglobin: 13.8 g/dL (ref 12.0–15.0)
Lymphocytes Relative: 29 % (ref 12–46)
Lymphs Abs: 1.8 10*3/uL (ref 0.7–4.0)
MCH: 30.3 pg (ref 26.0–34.0)
MCV: 86.8 fL (ref 78.0–100.0)
Monocytes Absolute: 0.4 10*3/uL (ref 0.1–1.0)
Monocytes Relative: 6 % (ref 3–12)
RBC: 4.56 MIL/uL (ref 3.87–5.11)
WBC: 6.3 10*3/uL (ref 4.0–10.5)

## 2012-10-08 LAB — BASIC METABOLIC PANEL
BUN: 25 mg/dL — ABNORMAL HIGH (ref 6–23)
CO2: 25 mEq/L (ref 19–32)
Calcium: 9.3 mg/dL (ref 8.4–10.5)
Creatinine, Ser: 1.1 mg/dL (ref 0.50–1.10)
Glucose, Bld: 98 mg/dL (ref 70–99)
Sodium: 138 mEq/L (ref 135–145)

## 2012-10-08 MED ORDER — METHYLPREDNISOLONE SODIUM SUCC 125 MG IJ SOLR
125.0000 mg | Freq: Once | INTRAMUSCULAR | Status: AC
  Administered 2012-10-08: 125 mg via INTRAVENOUS
  Filled 2012-10-08: qty 2

## 2012-10-08 MED ORDER — PREDNISONE 10 MG PO TABS: 20.0000 mg | ORAL_TABLET | Freq: Every day | ORAL | Status: DC

## 2012-10-08 MED ORDER — IPRATROPIUM BROMIDE 0.02 % IN SOLN
0.5000 mg | Freq: Once | RESPIRATORY_TRACT | Status: AC
  Administered 2012-10-08: 0.5 mg via RESPIRATORY_TRACT
  Filled 2012-10-08: qty 2.5

## 2012-10-08 MED ORDER — LEVOFLOXACIN 750 MG PO TABS: 750.0000 mg | ORAL_TABLET | Freq: Every day | ORAL | Status: DC

## 2012-10-08 MED ORDER — ALBUTEROL SULFATE (5 MG/ML) 0.5% IN NEBU
5.0000 mg | INHALATION_SOLUTION | Freq: Once | RESPIRATORY_TRACT | Status: AC
  Administered 2012-10-08: 5 mg via RESPIRATORY_TRACT
  Filled 2012-10-08: qty 1

## 2012-10-08 NOTE — ED Provider Notes (Signed)
History  This chart was scribed for Maudry Diego, MD by Roe Coombs, ED Scribe. The patient was seen in room APA04/APA04. Patient's care was started at 1124.   CSN: [id number]  Arrival date & time 10/08/12  1105   First MD Initiated Contact with Patient 10/08/12 1124      Chief Complaint  Patient presents with  . Shortness of Breath    Patient is a 78 y.o. female presenting with shortness of breath.  Shortness of Breath Severity:  Moderate Onset quality:  Gradual Duration:  3 days Timing:  Constant Progression:  Unchanged Chronicity:  Chronic Associated symptoms: no abdominal pain, no chest pain, no cough, no headaches and no rash      HPI Comments: Lisa [name] is a 79 y.o. female with history of COPD and hypertension who presents to the Emergency Department complaining of moderate constant shortness of breath and chest tightness for the past 3 days. There is associated congestion and rhinorrhea. Patient has an inhaler at home but it hasn't provided relief. Patient denies fever or cough. She has never been admitted to the hospital for COPD exacerbation. She is a current every day smoker. She does not use alcohol.  Past Medical History  Diagnosis Date  . COPD (chronic obstructive pulmonary disease)   . Hypertension     Past Surgical History  Procedure Laterality Date  . Abdominal hysterectomy      No family history on file.  History  Substance Use Topics  . Smoking status: Current Every Day Smoker    Types: Cigarettes  . Smokeless tobacco: Not on file  . Alcohol Use: No    OB History   Grav Para Term Preterm Abortions TAB SAB Ect Mult Living                  Review of Systems  Constitutional: Negative for appetite change and fatigue.  HENT: Positive for congestion and rhinorrhea. Negative for sinus pressure and ear discharge.   Eyes: Negative for discharge.  Respiratory: Positive for chest tightness and shortness of breath. Negative for cough.    Cardiovascular: Negative for chest pain.  Gastrointestinal: Negative for abdominal pain and diarrhea.  Genitourinary: Negative for frequency and hematuria.  Musculoskeletal: Negative for back pain.  Skin: Negative for rash.  Neurological: Negative for seizures and headaches.  Psychiatric/Behavioral: Negative for hallucinations.    Allergies  Review of patient's allergies indicates no known allergies.  Home Medications   Current Outpatient Rx  Name  Route  Sig  Dispense  Refill  . EXPIRED: albuterol (PROVENTIL HFA;VENTOLIN HFA) 108 (90 BASE) MCG/ACT inhaler   Inhalation   Inhale 1-2 puffs into the lungs every 6 (six) hours as needed for wheezing.   1 Inhaler   0   . albuterol-ipratropium (COMBIVENT) 18-103 MCG/ACT inhaler   Inhalation   Inhale 2 puffs into the lungs 4 (four) times daily.         Marland Kitchen diltiazem (CARDIZEM CD) 120 MG 24 hr capsule   Oral   Take 120 mg by mouth daily.           Marland Kitchen ipratropium (ATROVENT) 0.02 % nebulizer solution   Nebulization   Take 500 mcg by nebulization every 4 (four) hours as needed. FOR SHORTNESS OF BREATH           . ipratropium-albuterol (DUONEB) 0.5-2.5 (3) MG/3ML SOLN   Nebulization   Take 3 mLs by nebulization 4 (four) times daily.         Marland Kitchen  labetalol (NORMODYNE) 300 MG tablet   Oral   Take 300 mg by mouth 2 (two) times daily.           Marland Kitchen losartan-hydrochlorothiazide (HYZAAR) 100-25 MG per tablet   Oral   Take 1 tablet by mouth daily.           . simvastatin (ZOCOR) 20 MG tablet   Oral   Take 20 mg by mouth daily.           Triage Vitals: BP 207/100  Pulse 81  Temp(Src) 98.4 F (36.9 C) (Oral)  Resp 22  SpO2 93%  Physical Exam  Constitutional: She is oriented to person, place, and time. She appears well-developed.  HENT:  Head: Normocephalic.  Eyes: Conjunctivae and EOM are normal. No scleral icterus.  Neck: Neck supple. No thyromegaly present.  Cardiovascular: Normal rate and regular rhythm.  Exam  reveals no gallop and no friction rub.   No murmur heard. Pulmonary/Chest: No stridor. She has wheezes (moderate, bilateral). She has no rales. She exhibits no tenderness.  Abdominal: She exhibits no distension. There is no tenderness. There is no rebound.  Musculoskeletal: Normal range of motion. She exhibits no edema.  Lymphadenopathy:    She has no cervical adenopathy.  Neurological: She is oriented to person, place, and time. Coordination normal.  Skin: No rash noted. No erythema.  Psychiatric: She has a normal mood and affect. Her behavior is normal.    ED Course  Procedures (including critical care time) DIAGNOSTIC STUDIES: Oxygen Saturation is 93% on room air, adequate by my interpretation.    COORDINATION OF CARE: 11:30 AM- Patient informed of current plan for treatment and evaluation and agrees with plan at this time.     Labs Reviewed  CBC WITH DIFFERENTIAL  BASIC METABOLIC PANEL    No results found.   No diagnosis found.   Date: 10/08/2012  Rate:80  Rhythm: normal sinus rhythm  QRS Axis: normal  Intervals: normal  ST/T Wave abnormalities: nonspecific ST changes  Conduction Disutrbances:none  Narrative Interpretation:RAE   Old EKG Reviewed: none available    MDM        The chart was scribed for me under my direct supervision.  I personally performed the history, physical, and medical decision making and all procedures in the evaluation of this patient.Maudry Diego, MD 10/08/12 [phone]

## 2012-10-08 NOTE — ED Notes (Signed)
Pt c/o nonproductive cough and SOB x 3 days.   Unknown if has had fever.  Denies any pain.

## 2012-11-21 ENCOUNTER — Ambulatory Visit (HOSPITAL_COMMUNITY)
Admission: RE | Admit: 2012-11-21 | Discharge: 2012-11-21 | Disposition: A | Discharge status: Home / Self Care | Payer: Medicare Other | Source: Ambulatory Visit | Attending: Internal Medicine | Admitting: Internal Medicine

## 2012-11-21 ENCOUNTER — Other Ambulatory Visit (HOSPITAL_COMMUNITY): Payer: Self-pay | Admitting: Internal Medicine

## 2012-11-21 DIAGNOSIS — M25569 Pain in unspecified knee: Secondary | ICD-10-CM | POA: Insufficient documentation

## 2012-11-21 DIAGNOSIS — M25562 Pain in left knee: Secondary | ICD-10-CM

## 2012-12-19 ENCOUNTER — Other Ambulatory Visit (HOSPITAL_COMMUNITY): Payer: Self-pay | Admitting: Orthopaedic Surgery

## 2012-12-19 DIAGNOSIS — M2530 Other instability, unspecified joint: Secondary | ICD-10-CM

## 2012-12-24 ENCOUNTER — Ambulatory Visit (HOSPITAL_COMMUNITY)
Admission: RE | Admit: 2012-12-24 | Discharge: 2012-12-24 | Disposition: A | Discharge status: Home / Self Care | Payer: Medicare Other | Source: Ambulatory Visit | Attending: Orthopaedic Surgery | Admitting: Orthopaedic Surgery

## 2012-12-24 ENCOUNTER — Encounter (HOSPITAL_COMMUNITY): Payer: Self-pay

## 2012-12-24 DIAGNOSIS — M2240 Chondromalacia patellae, unspecified knee: Secondary | ICD-10-CM | POA: Insufficient documentation

## 2012-12-24 DIAGNOSIS — M2530 Other instability, unspecified joint: Secondary | ICD-10-CM

## 2012-12-24 DIAGNOSIS — M25569 Pain in unspecified knee: Secondary | ICD-10-CM | POA: Insufficient documentation

## 2013-07-09 ENCOUNTER — Other Ambulatory Visit: Payer: Self-pay | Admitting: Neurology

## 2013-07-09 DIAGNOSIS — R270 Ataxia, unspecified: Secondary | ICD-10-CM

## 2013-07-21 ENCOUNTER — Ambulatory Visit (HOSPITAL_COMMUNITY)
Admission: RE | Admit: 2013-07-21 | Discharge: 2013-07-21 | Disposition: A | Discharge status: Home / Self Care | Payer: Medicare Other | Source: Ambulatory Visit | Attending: Neurology | Admitting: Neurology

## 2013-07-21 DIAGNOSIS — R42 Dizziness and giddiness: Secondary | ICD-10-CM | POA: Insufficient documentation

## 2013-07-21 DIAGNOSIS — R270 Ataxia, unspecified: Secondary | ICD-10-CM

## 2013-07-21 DIAGNOSIS — R279 Unspecified lack of coordination: Secondary | ICD-10-CM | POA: Insufficient documentation

## 2014-04-02 ENCOUNTER — Encounter (HOSPITAL_COMMUNITY): Payer: Self-pay | Admitting: Emergency Medicine

## 2014-04-02 ENCOUNTER — Emergency Department (HOSPITAL_COMMUNITY): Payer: Medicare Other

## 2014-04-02 ENCOUNTER — Emergency Department (HOSPITAL_COMMUNITY)
Admission: EM | Admit: 2014-04-02 | Discharge: 2014-04-02 | Disposition: A | Discharge status: Home / Self Care | Payer: Medicare Other | Attending: Emergency Medicine | Admitting: Emergency Medicine

## 2014-04-02 DIAGNOSIS — X58XXXA Exposure to other specified factors, initial encounter: Secondary | ICD-10-CM | POA: Insufficient documentation

## 2014-04-02 DIAGNOSIS — J449 Chronic obstructive pulmonary disease, unspecified: Secondary | ICD-10-CM | POA: Diagnosis not present

## 2014-04-02 DIAGNOSIS — S3992XA Unspecified injury of lower back, initial encounter: Secondary | ICD-10-CM | POA: Diagnosis present

## 2014-04-02 DIAGNOSIS — Z720 Tobacco use: Secondary | ICD-10-CM | POA: Insufficient documentation

## 2014-04-02 DIAGNOSIS — Y9389 Activity, other specified: Secondary | ICD-10-CM | POA: Insufficient documentation

## 2014-04-02 DIAGNOSIS — I10 Essential (primary) hypertension: Secondary | ICD-10-CM | POA: Insufficient documentation

## 2014-04-02 DIAGNOSIS — Y9289 Other specified places as the place of occurrence of the external cause: Secondary | ICD-10-CM | POA: Insufficient documentation

## 2014-04-02 DIAGNOSIS — M549 Dorsalgia, unspecified: Secondary | ICD-10-CM

## 2014-04-02 DIAGNOSIS — Z79899 Other long term (current) drug therapy: Secondary | ICD-10-CM | POA: Insufficient documentation

## 2014-04-02 MED ORDER — LOSARTAN POTASSIUM 50 MG PO TABS
100.0000 mg | ORAL_TABLET | Freq: Once | ORAL | Status: AC
  Administered 2014-04-02: 100 mg via ORAL
  Filled 2014-04-02: qty 2

## 2014-04-02 MED ORDER — DIAZEPAM 5 MG PO TABS
5.0000 mg | ORAL_TABLET | Freq: Once | ORAL | Status: AC
  Administered 2014-04-02: 5 mg via ORAL
  Filled 2014-04-02: qty 1

## 2014-04-02 MED ORDER — HYDROMORPHONE HCL 1 MG/ML IJ SOLN
1.0000 mg | Freq: Once | INTRAMUSCULAR | Status: AC
  Administered 2014-04-02: 1 mg via INTRAMUSCULAR
  Filled 2014-04-02: qty 1

## 2014-04-02 MED ORDER — LABETALOL HCL 100 MG PO TABS
300.0000 mg | ORAL_TABLET | Freq: Once | ORAL | Status: AC
  Administered 2014-04-02: 300 mg via ORAL
  Filled 2014-04-02: qty 3

## 2014-04-02 MED ORDER — TRAMADOL HCL 50 MG PO TABS: 50.0000 mg | ORAL_TABLET | Freq: Four times a day (QID) | ORAL | Status: DC | PRN

## 2014-04-02 MED ORDER — IBUPROFEN 400 MG PO TABS: 400.0000 mg | ORAL_TABLET | Freq: Three times a day (TID) | ORAL | Status: DC

## 2014-04-02 MED ORDER — HYDROCHLOROTHIAZIDE 25 MG PO TABS
25.0000 mg | ORAL_TABLET | Freq: Once | ORAL | Status: AC
  Administered 2014-04-02: 25 mg via ORAL
  Filled 2014-04-02: qty 1

## 2014-04-02 MED ORDER — DIAZEPAM 2 MG PO TABS: 5.0000 mg | ORAL_TABLET | Freq: Three times a day (TID) | ORAL | Status: AC

## 2014-04-02 MED ORDER — LOSARTAN POTASSIUM-HCTZ 100-25 MG PO TABS
1.0000 | ORAL_TABLET | Freq: Once | ORAL | Status: DC
Start: 2014-04-02 — End: 2014-04-02

## 2014-04-02 MED ORDER — DILTIAZEM HCL ER COATED BEADS 120 MG PO CP24
120.0000 mg | ORAL_CAPSULE | Freq: Once | ORAL | Status: AC
  Administered 2014-04-02: 120 mg via ORAL
  Filled 2014-04-02: qty 1

## 2014-04-02 MED ORDER — KETOROLAC TROMETHAMINE 30 MG/ML IJ SOLN
30.0000 mg | Freq: Once | INTRAMUSCULAR | Status: AC
  Administered 2014-04-02: 30 mg via INTRAMUSCULAR
  Filled 2014-04-02: qty 1

## 2014-04-02 NOTE — ED Notes (Signed)
Was trying to help husband up last night after he fell.  C/o lower back pain since.

## 2014-04-02 NOTE — Discharge Instructions (Signed)
As discussed, with your acute back pain it is very important that you monitor your condition carefully, and follow up with your primary care physician to discuss appropriate management, as well as consideration of physical therapy.  Please return here for concerning changes in your condition.   Back Pain, Adult Low back pain is very common. About 1 in 5 people have back pain.The cause of low back pain is rarely dangerous. The pain often gets better over time.About half of people with a sudden onset of back pain feel better in just 2 weeks. About 8 in 10 people feel better by 6 weeks.  CAUSES Some common causes of back pain include:  Strain of the muscles or ligaments supporting the spine.  Wear and tear (degeneration) of the spinal discs.  Arthritis.  Direct injury to the back. DIAGNOSIS Most of the time, the direct cause of low back pain is not known.However, back pain can be treated effectively even when the exact cause of the pain is unknown.Answering your caregiver's questions about your overall health and symptoms is one of the most accurate ways to make sure the cause of your pain is not dangerous. If your caregiver needs more information, he or she may order lab work or imaging tests (X-rays or MRIs).However, even if imaging tests show changes in your back, this usually does not require surgery. HOME CARE INSTRUCTIONS For many people, back pain returns.Since low back pain is rarely dangerous, it is often a condition that people can learn to [facility] their own.   Remain active. It is stressful on the back to sit or stand in one place. Do not sit, drive, or stand in one place for more than 30 minutes at a time. Take short walks on level surfaces as soon as pain allows.Try to increase the length of time you walk each day.  Do not stay in bed.Resting more than 1 or 2 days can delay your recovery.  Do not avoid exercise or work.Your body is made to move.It is not dangerous to  be active, even though your back may hurt.Your back will likely heal faster if you return to being active before your pain is gone.  Pay attention to your body when you bend and lift. Many people have less discomfortwhen lifting if they bend their knees, keep the load close to their bodies,and avoid twisting. Often, the most comfortable positions are those that put less stress on your recovering back.  Find a comfortable position to sleep. Use a firm mattress and lie on your side with your knees slightly bent. If you lie on your back, put a pillow under your knees.  Only take over-the-counter or prescription medicines as directed by your caregiver. Over-the-counter medicines to reduce pain and inflammation are often the most helpful.Your caregiver may prescribe muscle relaxant drugs.These medicines help dull your pain so you can more quickly return to your normal activities and healthy exercise.  Put ice on the injured area.  Put ice in a plastic bag.  Place a towel between your skin and the bag.  Leave the ice on for 15-20 minutes, 03-04 times a day for the first 2 to 3 days. After that, ice and heat may be alternated to reduce pain and spasms.  Ask your caregiver about trying back exercises and gentle massage. This may be of some benefit.  Avoid feeling anxious or stressed.Stress increases muscle tension and can worsen back pain.It is important to recognize when you are anxious or stressed and learn  ways to manage it.Exercise is a great option. SEEK MEDICAL CARE IF:  You have pain that is not relieved with rest or medicine.  You have pain that does not improve in 1 week.  You have new symptoms.  You are generally not feeling well. SEEK IMMEDIATE MEDICAL CARE IF:   You have pain that radiates from your back into your legs.  You develop new bowel or bladder control problems.  You have unusual weakness or numbness in your arms or legs.  You develop nausea or  vomiting.  You develop abdominal pain.  You feel faint. Document Released: 05/29/2005 Document Revised: 11/28/2011 Document Reviewed: 09/30/2013 [facility] Patient Information 2015 [place], [place]. This information is not intended to replace advice given to you by your health care provider. Make sure you discuss any questions you have with your health care provider.

## 2014-04-02 NOTE — ED Provider Notes (Signed)
CSN: [id number]     Arrival date & time 04/02/14  1151 History  This chart was scribed for Lisa Muskrat, MD by [facility], ED Scribe. The patient was seen in [facility] and the patient's care was started at 1:22 PM.  Chief Complaint  Patient presents with  . Back Pain   Patient is a 79 y.o. female presenting with back pain. The history is provided by the patient. No language interpreter was used.  Back Pain  HPI Comments: Lisa [name] is a 79 y.o. female who presents to the Emergency Department complaining of lower back pain. Last night she was trying to lift up her husband who had fallen and hurt her back. Pain raidates to the right left. Pt denies falling, abdominal pain, fever. syncope, blurry vision. Pt took a tylenol this morning which provided some mild relief. Pt has no history of back pain   Past Medical History  Diagnosis Date  . COPD (chronic obstructive pulmonary disease)   . Hypertension    Past Surgical History  Procedure Laterality Date  . Abdominal hysterectomy     History reviewed. No pertinent family history. History  Substance Use Topics  . Smoking status: Current Every Day Smoker    Types: Cigarettes  . Smokeless tobacco: Not on file  . Alcohol Use: No   OB History   Grav Para Term Preterm Abortions TAB SAB Ect Mult Living                 Review of Systems  Constitutional:       Per HPI, otherwise negative  HENT:       Per HPI, otherwise negative  Respiratory:       Per HPI, otherwise negative  Cardiovascular:       Per HPI, otherwise negative  Gastrointestinal: Negative for vomiting.  Endocrine:       Negative aside from HPI  Genitourinary:       Neg aside from HPI   Musculoskeletal: Positive for back pain.       Per HPI, otherwise negative  Skin: Negative.   Neurological: Negative for syncope.      Allergies  Review of patient's allergies indicates no known allergies.  Home Medications   Prior to Admission  medications   Medication Sig Start Date End Date Taking? Authorizing Provider  acetaminophen (TYLENOL) 500 MG tablet Take 500 mg by mouth every 6 (six) hours as needed for moderate pain.   Yes Historical Provider, MD  albuterol-ipratropium (COMBIVENT) 18-103 MCG/ACT inhaler Inhale 2 puffs into the lungs 4 (four) times daily.   Yes Historical Provider, MD  B Complex Vitamins (VITAMIN-B COMPLEX PO) Take 1 tablet by mouth daily.   Yes Historical Provider, MD  CALCIUM PO Take 1 tablet by mouth daily.   Yes Historical Provider, MD  diltiazem (CARDIZEM CD) 120 MG 24 hr capsule Take 120 mg by mouth daily.     Yes Historical Provider, MD  furosemide (LASIX) 40 MG tablet Take 40 mg by mouth daily as needed for fluid.    Yes Historical Provider, MD  ipratropium-albuterol (DUONEB) 0.5-2.5 (3) MG/3ML SOLN Take 3 mLs by nebulization 4 (four) times daily.   Yes Historical Provider, MD  labetalol (NORMODYNE) 300 MG tablet Take 300 mg by mouth 2 (two) times daily.     Yes Historical Provider, MD  losartan-hydrochlorothiazide (HYZAAR) 100-25 MG per tablet Take 1 tablet by mouth daily.     Yes Historical Provider, MD  Multiple Vitamins-Minerals (CENTRUM  ADULTS PO) Take 1 tablet by mouth daily.   Yes Historical Provider, MD   BP 203/74  Pulse 80  Temp(Src) 99.5 F (37.5 C) (Oral)  SpO2 99% Physical Exam  Nursing note and vitals reviewed. Constitutional: She is oriented to person, place, and time. She appears well-developed and well-nourished. No distress.  HENT:  Head: Normocephalic and atraumatic.  Eyes: Conjunctivae and EOM are normal.  Cardiovascular: Normal rate and regular rhythm.   Pulmonary/Chest: Effort normal and breath sounds normal. No stridor. No respiratory distress.  Abdominal: She exhibits no distension. There is no tenderness.  Musculoskeletal: She exhibits no edema.  Pain with active flexion of both hips. Pain illicted with ROM exercises. Minimal pain with bilaterally hip flexion.   Neurological: She is alert and oriented to person, place, and time. No cranial nerve deficit.  Neurological exam unremarkable   Skin: Skin is warm and dry.  Psychiatric: She has a normal mood and affect.    ED Course  Procedures DIAGNOSTIC STUDIES: Oxygen Saturation is99% on room air, normal by my interpretation.    COORDINATION OF CARE: 1:25 PM Discussed treatment plan with pt at bedside and pt agreed to plan.  1530: Patient notes that her pain has subsided.  Patient has not taken blood pressure medication yet today, and has not yet received it from pharmacy.  4:51 PM BP now 175/ 70 Pain has come back  Patient describes increasing pain.  X-ray will be performed.  7:08 PM Blood pressure still well controlled, pain persists.  However, the patient is now sitting with knees flexed and hips flexed, in no distress.  When she attempts to move, she describes spasm sensation in the back.  8:27 PM Patient remains in pain, though she can stand, ambulate minimally, continues to have no loss of sensation or strength in any of the extremities. I discussed our recommended for admission with patient. Patient states that her husband recently had a stroke, and she is the only caregiver for him. I discussed return precautions and the need to monitor her condition paragraph take all medication as directed, and to ensure that she gets plenty of rest, treats her back pain appropriately.  MDM    This elderly female presents with low back pain that began after straining yesterday.  Patient has no neurologic red flags, is awake and alert, with appropriate sensation in the distal extremities, and her pain resolves in the emergency department.  More concerning, the patient is notably hypertensive.  This improves after provision of the patient's home medications and absent any ongoing complaints, she was discharged in stable condition.  I personally performed the services described in this documentation,  which was scribed in my presence. The recorded information has been reviewed and is accurate.     Lisa Muskrat, MD 04/02/14 2028

## 2014-04-06 ENCOUNTER — Inpatient Hospital Stay (HOSPITAL_COMMUNITY)
Admission: EM | Admit: 2014-04-06 | Discharge: 2014-04-15 | DRG: 871 | Disposition: A | Discharge status: Skilled Nursing Facility | Payer: Medicare Other | Attending: Internal Medicine | Admitting: Internal Medicine

## 2014-04-06 ENCOUNTER — Encounter (HOSPITAL_COMMUNITY): Payer: Self-pay | Admitting: Emergency Medicine

## 2014-04-06 ENCOUNTER — Emergency Department (HOSPITAL_COMMUNITY): Payer: Medicare Other

## 2014-04-06 DIAGNOSIS — Z681 Body mass index (BMI) 19 or less, adult: Secondary | ICD-10-CM

## 2014-04-06 DIAGNOSIS — R509 Fever, unspecified: Secondary | ICD-10-CM

## 2014-04-06 DIAGNOSIS — N10 Acute tubulo-interstitial nephritis: Secondary | ICD-10-CM

## 2014-04-06 DIAGNOSIS — G9340 Encephalopathy, unspecified: Secondary | ICD-10-CM

## 2014-04-06 DIAGNOSIS — A491 Streptococcal infection, unspecified site: Secondary | ICD-10-CM

## 2014-04-06 DIAGNOSIS — N179 Acute kidney failure, unspecified: Secondary | ICD-10-CM | POA: Insufficient documentation

## 2014-04-06 DIAGNOSIS — D72829 Elevated white blood cell count, unspecified: Secondary | ICD-10-CM | POA: Diagnosis not present

## 2014-04-06 DIAGNOSIS — E860 Dehydration: Secondary | ICD-10-CM | POA: Diagnosis present

## 2014-04-06 DIAGNOSIS — N390 Urinary tract infection, site not specified: Secondary | ICD-10-CM

## 2014-04-06 DIAGNOSIS — G92 Toxic encephalopathy: Secondary | ICD-10-CM | POA: Diagnosis present

## 2014-04-06 DIAGNOSIS — R6520 Severe sepsis without septic shock: Secondary | ICD-10-CM | POA: Diagnosis present

## 2014-04-06 DIAGNOSIS — R29898 Other symptoms and signs involving the musculoskeletal system: Secondary | ICD-10-CM

## 2014-04-06 DIAGNOSIS — G8929 Other chronic pain: Secondary | ICD-10-CM | POA: Diagnosis present

## 2014-04-06 DIAGNOSIS — I340 Nonrheumatic mitral (valve) insufficiency: Secondary | ICD-10-CM | POA: Diagnosis present

## 2014-04-06 DIAGNOSIS — F17210 Nicotine dependence, cigarettes, uncomplicated: Secondary | ICD-10-CM | POA: Diagnosis present

## 2014-04-06 DIAGNOSIS — N12 Tubulo-interstitial nephritis, not specified as acute or chronic: Secondary | ICD-10-CM

## 2014-04-06 DIAGNOSIS — I5043 Acute on chronic combined systolic (congestive) and diastolic (congestive) heart failure: Secondary | ICD-10-CM | POA: Diagnosis present

## 2014-04-06 DIAGNOSIS — A408 Other streptococcal sepsis: Principal | ICD-10-CM | POA: Diagnosis present

## 2014-04-06 DIAGNOSIS — Z720 Tobacco use: Secondary | ICD-10-CM

## 2014-04-06 DIAGNOSIS — R109 Unspecified abdominal pain: Secondary | ICD-10-CM

## 2014-04-06 DIAGNOSIS — M545 Low back pain: Secondary | ICD-10-CM | POA: Diagnosis present

## 2014-04-06 DIAGNOSIS — E440 Moderate protein-calorie malnutrition: Secondary | ICD-10-CM

## 2014-04-06 DIAGNOSIS — Z8639 Personal history of other endocrine, nutritional and metabolic disease: Secondary | ICD-10-CM

## 2014-04-06 DIAGNOSIS — R7881 Bacteremia: Secondary | ICD-10-CM

## 2014-04-06 DIAGNOSIS — J449 Chronic obstructive pulmonary disease, unspecified: Secondary | ICD-10-CM

## 2014-04-06 DIAGNOSIS — I10 Essential (primary) hypertension: Secondary | ICD-10-CM

## 2014-04-06 DIAGNOSIS — F0390 Unspecified dementia without behavioral disturbance: Secondary | ICD-10-CM | POA: Diagnosis present

## 2014-04-06 DIAGNOSIS — K089 Disorder of teeth and supporting structures, unspecified: Secondary | ICD-10-CM | POA: Diagnosis present

## 2014-04-06 HISTORY — DX: Tubulo-interstitial nephritis, not specified as acute or chronic: N12

## 2014-04-06 LAB — URINALYSIS, ROUTINE W REFLEX MICROSCOPIC
Bilirubin Urine: NEGATIVE
Glucose, UA: NEGATIVE mg/dL
Ketones, ur: NEGATIVE mg/dL
LEUKOCYTES UA: NEGATIVE
NITRITE: NEGATIVE
PROTEIN: 100 mg/dL — AB
Specific Gravity, Urine: 1.017 (ref 1.005–1.030)
UROBILINOGEN UA: 1 mg/dL (ref 0.0–1.0)
pH: 5 (ref 5.0–8.0)

## 2014-04-06 LAB — DIFFERENTIAL
BASOS ABS: 0 10*3/uL (ref 0.0–0.1)
Basophils Relative: 0 % (ref 0–1)
Eosinophils Absolute: 0 10*3/uL (ref 0.0–0.7)
Eosinophils Relative: 0 % (ref 0–5)
LYMPHS ABS: 0.9 10*3/uL (ref 0.7–4.0)
LYMPHS PCT: 6 % — AB (ref 12–46)
MONO ABS: 1.7 10*3/uL — AB (ref 0.1–1.0)
Monocytes Relative: 12 % (ref 3–12)
Neutro Abs: 11.6 10*3/uL — ABNORMAL HIGH (ref 1.7–7.7)
Neutrophils Relative %: 82 % — ABNORMAL HIGH (ref 43–77)

## 2014-04-06 LAB — CBC
HCT: 39.5 % (ref 36.0–46.0)
HEMOGLOBIN: 13.3 g/dL (ref 12.0–15.0)
MCH: 30.1 pg (ref 26.0–34.0)
MCHC: 33.7 g/dL (ref 30.0–36.0)
MCV: 89.4 fL (ref 78.0–100.0)
Platelets: 183 10*3/uL (ref 150–400)
RBC: 4.42 MIL/uL (ref 3.87–5.11)
RDW: 13.3 % (ref 11.5–15.5)
WBC: 14.2 10*3/uL — ABNORMAL HIGH (ref 4.0–10.5)

## 2014-04-06 LAB — COMPREHENSIVE METABOLIC PANEL
ALK PHOS: 78 U/L (ref 39–117)
ALT: 20 U/L (ref 0–35)
ANION GAP: 17 — AB (ref 5–15)
AST: 29 U/L (ref 0–37)
Albumin: 2.9 g/dL — ABNORMAL LOW (ref 3.5–5.2)
BUN: 54 mg/dL — ABNORMAL HIGH (ref 6–23)
CO2: 23 mEq/L (ref 19–32)
Calcium: 10.1 mg/dL (ref 8.4–10.5)
Chloride: 97 mEq/L (ref 96–112)
Creatinine, Ser: 1.54 mg/dL — ABNORMAL HIGH (ref 0.50–1.10)
GFR calc non Af Amer: 31 mL/min — ABNORMAL LOW (ref >90)
GFR, EST AFRICAN AMERICAN: 36 mL/min — AB (ref >90)
GLUCOSE: 107 mg/dL — AB (ref 70–99)
POTASSIUM: 4.3 meq/L (ref 3.7–5.3)
Sodium: 137 mEq/L (ref 137–147)
TOTAL PROTEIN: 8.5 g/dL — AB (ref 6.0–8.3)
Total Bilirubin: 0.6 mg/dL (ref 0.3–1.2)

## 2014-04-06 LAB — URINE MICROSCOPIC-ADD ON

## 2014-04-06 LAB — I-STAT CG4 LACTIC ACID, ED: LACTIC ACID, VENOUS: 1.57 mmol/L (ref 0.5–2.2)

## 2014-04-06 LAB — PROTIME-INR
INR: 1.05 (ref 0.00–1.49)
PROTHROMBIN TIME: 13.8 s (ref 11.6–15.2)

## 2014-04-06 MED ORDER — SODIUM CHLORIDE 0.9 % IV BOLUS (SEPSIS): 1000.0000 mL | INTRAVENOUS | Status: DC

## 2014-04-06 MED ORDER — ACETAMINOPHEN 325 MG PO TABS
650.0000 mg | ORAL_TABLET | Freq: Once | ORAL | Status: AC
  Administered 2014-04-06: 650 mg via ORAL
  Filled 2014-04-06: qty 2

## 2014-04-06 MED ORDER — PIPERACILLIN-TAZOBACTAM 3.375 G IVPB 30 MIN
3.3750 g | Freq: Once | INTRAVENOUS | Status: AC
  Administered 2014-04-06: 3.375 g via INTRAVENOUS
  Filled 2014-04-06: qty 50

## 2014-04-06 MED ORDER — SODIUM CHLORIDE 0.9 % IV BOLUS (SEPSIS)
1000.0000 mL | Freq: Once | INTRAVENOUS | Status: AC
  Administered 2014-04-06: 1000 mL via INTRAVENOUS

## 2014-04-06 MED ORDER — IPRATROPIUM-ALBUTEROL 0.5-2.5 (3) MG/3ML IN SOLN
3.0000 mL | Freq: Once | RESPIRATORY_TRACT | Status: AC
  Administered 2014-04-06: 3 mL via RESPIRATORY_TRACT
  Filled 2014-04-06: qty 3

## 2014-04-06 MED ORDER — LABETALOL HCL 300 MG PO TABS: 300.0000 mg | ORAL_TABLET | Freq: Two times a day (BID) | ORAL | Status: DC

## 2014-04-06 MED ORDER — DILTIAZEM HCL ER COATED BEADS 120 MG PO CP24
120.0000 mg | ORAL_CAPSULE | Freq: Every day | ORAL | Status: DC
  Administered 2014-04-07 – 2014-04-15 (×8): 120 mg via ORAL
  Filled 2014-04-06 (×10): qty 1

## 2014-04-06 MED ORDER — SODIUM CHLORIDE 0.9 % IV BOLUS (SEPSIS): 1000.0000 mL | Freq: Once | INTRAVENOUS | Status: DC

## 2014-04-06 NOTE — ED Notes (Signed)
I Stat Lactic Acid results shown to Dr. Vanessa Ralphs

## 2014-04-06 NOTE — ED Provider Notes (Signed)
79 year old female is brought in with fever, chills, dysuria, right flank pain. On exam, she was running low-grade fever with mild tachycardia. Lungs are clear. There is right CVA tenderness. Abdomen is soft with mild tenderness in the suprapubic area. Patient did meet criteria for level to sepsis although I feel that her low level tachycardia was probably just related to fever. Urinalysis confirms UTI. Lactic acid level is normal. She is started on antibiotics and will need to be admitted. Heart rate has come down with fever control and IV fluids.  CRITICAL CARE Performed by: Delora Fuel Total critical care time: 35 minutes Critical care time was exclusive of separately billable procedures and treating other patients. Critical care was necessary to treat or prevent imminent or life-threatening deterioration. Critical care was time spent personally by me on the following activities: development of treatment plan with patient and/or surrogate as well as nursing, discussions with consultants, evaluation of patient's response to treatment, examination of patient, obtaining history from patient or surrogate, ordering and performing treatments and interventions, ordering and review of laboratory studies, ordering and review of radiographic studies, pulse oximetry and re-evaluation of patient's condition.  I saw and evaluated the patient, reviewed the resident's note and I agree with the findings and plan.   Delora Fuel, MD 23/34/35 [id number]

## 2014-04-06 NOTE — ED Notes (Addendum)
Pt reports for 1 week having lower back pain after fall; has been seen at [facility] for same and had xrays but pain is not getting any better; pt does report pain when voiding

## 2014-04-06 NOTE — ED Provider Notes (Signed)
CSN: [id number]     Arrival date & time 04/06/14  1425 History   First MD Initiated Contact with Patient 04/06/14 1725     Chief Complaint  Patient presents with  . Back Pain  . Dysuria    Patient is a 80 y.o. female presenting with fever. The history is provided by the patient and a relative.  Fever Temp source:  Subjective Severity:  Severe Onset quality:  Gradual Duration: unable to specify. Timing:  Constant Progression:  Worsening Chronicity:  New Relieved by:  None tried Worsened by:  Nothing tried Ineffective treatments:  None tried Associated symptoms: dysuria   Associated symptoms: no chest pain, no cough, no nausea, no rash and no vomiting   Dysuria:    Severity:  Moderate   Onset quality:  Gradual   Duration: unable to specify.   Timing:  Constant   Progression:  Unchanged   Chronicity:  New Risk factors: no recent surgery     Past Medical History  Diagnosis Date  . COPD (chronic obstructive pulmonary disease)   . Hypertension    Past Surgical History  Procedure Laterality Date  . Abdominal hysterectomy     History reviewed. No pertinent family history. History  Substance Use Topics  . Smoking status: Current Every Day Smoker -- 0.30 packs/day    Types: Cigarettes  . Smokeless tobacco: Not on file  . Alcohol Use: No   OB History   Grav Para Term Preterm Abortions TAB SAB Ect Mult Living                 Review of Systems  Constitutional: Positive for fever.  Respiratory: Negative for cough and shortness of breath.   Cardiovascular: Negative for chest pain.  Gastrointestinal: Positive for abdominal pain. Negative for nausea and vomiting.  Genitourinary: Positive for dysuria and frequency.  Musculoskeletal: Positive for back pain. Negative for neck stiffness.  Skin: Negative for rash.  Neurological: Positive for weakness (generalized).  All other systems reviewed and are negative.   Allergies  Review of patient's allergies indicates no known  allergies.  Home Medications   Prior to Admission medications   Medication Sig Start Date End Date Taking? Authorizing Provider  albuterol-ipratropium (COMBIVENT) 18-103 MCG/ACT inhaler Inhale 2 puffs into the lungs 4 (four) times daily.   Yes Historical Provider, MD  B Complex Vitamins (VITAMIN-B COMPLEX PO) Take 1 tablet by mouth daily.   Yes Historical Provider, MD  CALCIUM PO Take 1 tablet by mouth daily.   Yes Historical Provider, MD  diazepam (VALIUM) 2 MG tablet Take 2.5 tablets (5 mg total) by mouth 3 (three) times daily. 04/02/14 04/06/14 Yes Carmin Muskrat, MD  diltiazem (CARDIZEM CD) 120 MG 24 hr capsule Take 120 mg by mouth daily.     Yes Historical Provider, MD  furosemide (LASIX) 40 MG tablet Take 40 mg by mouth daily as needed for fluid.    Yes Historical Provider, MD  ibuprofen (ADVIL,MOTRIN) 400 MG tablet Take 400 mg by mouth every 6 (six) hours as needed for moderate pain. 04/02/14 04/06/14 Yes Carmin Muskrat, MD  ipratropium-albuterol (DUONEB) 0.5-2.5 (3) MG/3ML SOLN Take 3 mLs by nebulization every 6 (six) hours as needed. For shortness of breath   Yes Historical Provider, MD  labetalol (NORMODYNE) 300 MG tablet Take 300 mg by mouth 2 (two) times daily.     Yes Historical Provider, MD  losartan-hydrochlorothiazide (HYZAAR) 100-25 MG per tablet Take 1 tablet by mouth daily.     Yes Historical  Provider, MD  Multiple Vitamins-Minerals (CENTRUM ADULTS PO) Take 1 tablet by mouth daily.   Yes Historical Provider, MD  traMADol (ULTRAM) 50 MG tablet Take 50 mg by mouth every 6 (six) hours as needed for moderate pain. 04/02/14  Yes Carmin Muskrat, MD   BP 199/76  Pulse 102  Temp(Src) 100.9 F (38.3 C) (Oral)  Resp 17  Ht 5\' 8"  (1.727 m)  Wt 126 lb (57.153 kg)  BMI 19.16 kg/m2  SpO2 95%  Physical Exam  Constitutional: She is oriented to person, place, and time. She appears well-developed and well-nourished. She is cooperative. No distress.  HENT:  Head: Normocephalic and  atraumatic.  Right Ear: External ear normal.  Left Ear: External ear normal.  Neck: Normal range of motion and phonation normal.  Cardiovascular: Regular rhythm.  Tachycardia present.   Pulmonary/Chest: Effort normal and breath sounds normal. No respiratory distress. She has no wheezes. She has no rales.  Abdominal: Soft. She exhibits no distension. There is tenderness (suprapubic, bilateral lower quadrants). There is no rebound, no guarding and no CVA tenderness.  Musculoskeletal:       Cervical back: She exhibits no bony tenderness.       Thoracic back: She exhibits no bony tenderness.       Lumbar back: She exhibits no bony tenderness.  Neurological: She is alert and oriented to person, place, and time.  Skin: Skin is warm and dry. No rash noted. She is not diaphoretic.    ED Course  Procedures  Labs Review  Results for orders placed during the hospital encounter of 04/06/14  CBC      Result Value Ref Range   WBC 14.2 (*) 4.0 - 10.5 K/uL   RBC 4.42  3.87 - 5.11 MIL/uL   Hemoglobin 13.3  12.0 - 15.0 g/dL   HCT 39.5  36.0 - 46.0 %   MCV 89.4  78.0 - 100.0 fL   MCH 30.1  26.0 - 34.0 pg   MCHC 33.7  30.0 - 36.0 g/dL   RDW 13.3  11.5 - 15.5 %   Platelets 183  150 - 400 K/uL  COMPREHENSIVE METABOLIC PANEL      Result Value Ref Range   Sodium 137  137 - 147 mEq/L   Potassium 4.3  3.7 - 5.3 mEq/L   Chloride 97  96 - 112 mEq/L   CO2 23  19 - 32 mEq/L   Glucose, Bld 107 (*) 70 - 99 mg/dL   BUN 54 (*) 6 - 23 mg/dL   Creatinine, Ser 1.54 (*) 0.50 - 1.10 mg/dL   Calcium 10.1  8.4 - 10.5 mg/dL   Total Protein 8.5 (*) 6.0 - 8.3 g/dL   Albumin 2.9 (*) 3.5 - 5.2 g/dL   AST 29  0 - 37 U/L   ALT 20  0 - 35 U/L   Alkaline Phosphatase 78  39 - 117 U/L   Total Bilirubin 0.6  0.3 - 1.2 mg/dL   GFR calc non Af Amer 31 (*) >90 mL/min   GFR calc Af Amer 36 (*) >90 mL/min   Anion gap 17 (*) 5 - 15  PROTIME-INR      Result Value Ref Range   Prothrombin Time 13.8  11.6 - 15.2 seconds    INR 1.05  0.00 - 1.49  URINALYSIS, ROUTINE W REFLEX MICROSCOPIC      Result Value Ref Range   Color, Urine YELLOW  YELLOW   APPearance CLOUDY (*) CLEAR   Specific  Gravity, Urine 1.017  1.005 - 1.030   pH 5.0  5.0 - 8.0   Glucose, UA NEGATIVE  NEGATIVE mg/dL   Hgb urine dipstick MODERATE (*) NEGATIVE   Bilirubin Urine NEGATIVE  NEGATIVE   Ketones, ur NEGATIVE  NEGATIVE mg/dL   Protein, ur 100 (*) NEGATIVE mg/dL   Urobilinogen, UA 1.0  0.0 - 1.0 mg/dL   Nitrite NEGATIVE  NEGATIVE   Leukocytes, UA NEGATIVE  NEGATIVE  DIFFERENTIAL      Result Value Ref Range   Neutrophils Relative % 82 (*) 43 - 77 %   Neutro Abs 11.6 (*) 1.7 - 7.7 K/uL   Lymphocytes Relative 6 (*) 12 - 46 %   Lymphs Abs 0.9  0.7 - 4.0 K/uL   Monocytes Relative 12  3 - 12 %   Monocytes Absolute 1.7 (*) 0.1 - 1.0 K/uL   Eosinophils Relative 0  0 - 5 %   Eosinophils Absolute 0.0  0.0 - 0.7 K/uL   Basophils Relative 0  0 - 1 %   Basophils Absolute 0.0  0.0 - 0.1 K/uL  URINE MICROSCOPIC-ADD ON      Result Value Ref Range   Squamous Epithelial / LPF FEW (*) RARE   WBC, UA 0-2  <3 WBC/hpf   RBC / HPF 0-2  <3 RBC/hpf   Bacteria, UA MANY (*) RARE   Casts HYALINE CASTS (*) NEGATIVE  I-STAT CG4 LACTIC ACID, ED      Result Value Ref Range   Lactic Acid, Venous 1.57  0.5 - 2.2 mmol/L     Imaging Review Dg Chest Portable 1 View  04/06/2014   CLINICAL DATA:  Fever, cough.  EXAM: PORTABLE CHEST - 1 VIEW  COMPARISON:  October 08, 2012.  FINDINGS: The heart size and mediastinal contours are within normal limits. Both lungs are clear. No pneumothorax or pleural effusion is noted. The visualized skeletal structures are unremarkable.  IMPRESSION: No acute cardiopulmonary abnormality seen.   Electronically Signed   By: Sabino Dick M.D.   On: 04/06/2014 19:16     MDM   Final diagnoses:  Fever    79 y.o. female with a past medical history of COPD and hypertension presents to the ED due to lower back pain, urinary frequency,  dysuria. At triage was found to be febrile with a temperature of 100.9 orally. Also noted to be tachycardic. Maintain normal oxygen saturation on room air. Remainder of exam as above. A level 2 code sepsis was called due to her fever and tachycardia.   Labs were obtained. CBC notable for a leukocytosis at 14.2 with a left shift. CMP notable for an elevated BUN/creatinine. Her creatinine is up from her baseline at 1.54. Estimated GFR is 36. Lactic acid within normal limits. Urinalysis showed many bacteria. Urine culture pending. Blood cultures pending.  Chest x-ray showed no acute cardiopulmonary abnormality.  She was given Tylenol with resolution of her fever and tachycardia. She was given 1 L normal saline. On reevaluation the patient is wheezing and states that she normally uses a DuoNeb daily. She states that she has not used DuoNeb yet today. DuoNeb ordered for her.  Medicine consult for admission given her fever, UTI. She was admitted to medicine.  This case was managed in conjunction with my attending, Dr. Roxanne Mins.    Berenice Primas, MD 04/07/14 [phone]

## 2014-04-06 NOTE — ED Notes (Signed)
Amy, NT assisted me with in and out cath on pt; urine sample collected and being sent to lab for testing; pt resting at this time

## 2014-04-06 NOTE — ED Notes (Signed)
Bolus slowed down due to pt reporting shortness of breath.  Expiratory wheezing auscultated.

## 2014-04-06 NOTE — H&P (Signed)
PCP:   FANTA,TESFAYE, MD   Chief Complaint:  Rt flank pain  HPI: 79 yo female with several days of rt flank pain worsening now with fever.  Also with suprapubic pain, does not feel well.  No n/v/d.  Some dyruria.  Not on abx frequently.  Has uti/pyelo.  Was initially tachycardic in the ED and febrile, this has improved with ivf and pain control.  Review of Systems:  Positive and negative as per HPI otherwise all other systems are negative  Past Medical History: Past Medical History  Diagnosis Date  . COPD (chronic obstructive pulmonary disease)   . Hypertension    Past Surgical History  Procedure Laterality Date  . Abdominal hysterectomy      Medications: Prior to Admission medications   Medication Sig Start Date End Date Taking? Authorizing Provider  albuterol-ipratropium (COMBIVENT) 18-103 MCG/ACT inhaler Inhale 2 puffs into the lungs 4 (four) times daily.   Yes Historical Provider, MD  B Complex Vitamins (VITAMIN-B COMPLEX PO) Take 1 tablet by mouth daily.   Yes Historical Provider, MD  CALCIUM PO Take 1 tablet by mouth daily.   Yes Historical Provider, MD  diazepam (VALIUM) 2 MG tablet Take 2.5 tablets (5 mg total) by mouth 3 (three) times daily. 04/02/14 04/06/14 Yes Carmin Muskrat, MD  diltiazem (CARDIZEM CD) 120 MG 24 hr capsule Take 120 mg by mouth daily.     Yes Historical Provider, MD  furosemide (LASIX) 40 MG tablet Take 40 mg by mouth daily as needed for fluid.    Yes Historical Provider, MD  ibuprofen (ADVIL,MOTRIN) 400 MG tablet Take 400 mg by mouth every 6 (six) hours as needed for moderate pain. 04/02/14 04/06/14 Yes Carmin Muskrat, MD  ipratropium-albuterol (DUONEB) 0.5-2.5 (3) MG/3ML SOLN Take 3 mLs by nebulization every 6 (six) hours as needed. For shortness of breath   Yes Historical Provider, MD  labetalol (NORMODYNE) 300 MG tablet Take 300 mg by mouth 2 (two) times daily.     Yes Historical Provider, MD  losartan-hydrochlorothiazide (HYZAAR) 100-25 MG per  tablet Take 1 tablet by mouth daily.     Yes Historical Provider, MD  Multiple Vitamins-Minerals (CENTRUM ADULTS PO) Take 1 tablet by mouth daily.   Yes Historical Provider, MD  traMADol (ULTRAM) 50 MG tablet Take 50 mg by mouth every 6 (six) hours as needed for moderate pain. 04/02/14  Yes Carmin Muskrat, MD    Allergies:  No Known Allergies  Social History:  reports that she has been smoking Cigarettes.  She has been smoking about 0.30 packs per day. She does not have any smokeless tobacco history on file. She reports that she does not drink alcohol or use illicit drugs.  Family History: History reviewed. No pertinent family history.  Physical Exam: Filed Vitals:   04/06/14 2157 04/06/14 2200 04/06/14 2230 04/06/14 2300  BP: 173/64 180/62 186/65 199/63  Pulse: 68 73 68 71  Temp: 99.4 F (37.4 C)     TempSrc: Oral     Resp: 22 24 23 24   Height:      Weight:      SpO2: 99% 100% 99% 99%   General appearance: alert, cooperative and no distress Head: Normocephalic, without obvious abnormality, atraumatic Eyes: negative Nose: Nares normal. Septum midline. Mucosa normal. No drainage or sinus tenderness. Neck: no JVD and supple, symmetrical, trachea midline Lungs: clear to auscultation bilaterally Heart: regular rate and rhythm, S1, S2 normal, no murmur, click, rub or gallop Abdomen: soft, non-tender; bowel sounds normal; no  masses,  no organomegaly rt cva ttp Extremities: extremities normal, atraumatic, no cyanosis or edema Pulses: 2+ and symmetric Skin: Skin color, texture, turgor normal. No rashes or lesions Neurologic: Grossly normal    Labs on Admission:   Recent Labs  04/06/14 1530  NA 137  K 4.3  CL 97  CO2 23  GLUCOSE 107*  BUN 54*  CREATININE 1.54*  CALCIUM 10.1    Recent Labs  04/06/14 1530  AST 29  ALT 20  ALKPHOS 78  BILITOT 0.6  PROT 8.5*  ALBUMIN 2.9*    Recent Labs  04/06/14 1530  WBC 14.2*  NEUTROABS 11.6*  HGB 13.3  HCT 39.5  MCV  89.4  PLT 183   Radiological Exams on Admission:  Dg Chest Portable 1 View  04/06/2014   CLINICAL DATA:  Fever, cough.  EXAM: PORTABLE CHEST - 1 VIEW  COMPARISON:  October 08, 2012.  FINDINGS: The heart size and mediastinal contours are within normal limits. Both lungs are clear. No pneumothorax or pleural effusion is noted. The visualized skeletal structures are unremarkable.  IMPRESSION: No acute cardiopulmonary abnormality seen.   Electronically Signed   By: Sabino Dick M.D.   On: 04/06/2014 19:16    Assessment/Plan  79 yo female with right pyelonephritis  Principal Problem:   Pyelonephritis, acute-  Iv rocephin.  Urine and blood cx have been sent.  Gentle ivf overnight.  Renal u/s in am.  Active Problems:  Stable unless o/w noted   Hypertension   COPD (chronic obstructive pulmonary disease)   Fever   UTI (lower urinary tract infection)   AKI (acute kidney injury)-  Bump up to 1.5 from 0.8 over a year ago, follow closely.  Likely due to infection/mild dehydration  obs on medical.  Full code.  Ian Castagna A 04/06/2014, 11:10 PM

## 2014-04-06 NOTE — ED Notes (Signed)
Pt undressed, in gown, on monitor, continuous pulse oximetry and blood pressure cuff; family at bedside 

## 2014-04-07 ENCOUNTER — Encounter (HOSPITAL_COMMUNITY): Payer: Self-pay | Admitting: General Practice

## 2014-04-07 ENCOUNTER — Observation Stay (HOSPITAL_COMMUNITY): Payer: Medicare Other

## 2014-04-07 DIAGNOSIS — I10 Essential (primary) hypertension: Secondary | ICD-10-CM | POA: Diagnosis not present

## 2014-04-07 DIAGNOSIS — D72829 Elevated white blood cell count, unspecified: Secondary | ICD-10-CM

## 2014-04-07 DIAGNOSIS — G8929 Other chronic pain: Secondary | ICD-10-CM | POA: Diagnosis not present

## 2014-04-07 DIAGNOSIS — A408 Other streptococcal sepsis: Secondary | ICD-10-CM | POA: Diagnosis present

## 2014-04-07 DIAGNOSIS — M545 Low back pain: Secondary | ICD-10-CM | POA: Diagnosis not present

## 2014-04-07 DIAGNOSIS — N390 Urinary tract infection, site not specified: Secondary | ICD-10-CM

## 2014-04-07 DIAGNOSIS — F17210 Nicotine dependence, cigarettes, uncomplicated: Secondary | ICD-10-CM | POA: Diagnosis not present

## 2014-04-07 DIAGNOSIS — Z681 Body mass index (BMI) 19 or less, adult: Secondary | ICD-10-CM | POA: Diagnosis not present

## 2014-04-07 DIAGNOSIS — E860 Dehydration: Secondary | ICD-10-CM | POA: Diagnosis present

## 2014-04-07 DIAGNOSIS — F0390 Unspecified dementia without behavioral disturbance: Secondary | ICD-10-CM | POA: Diagnosis present

## 2014-04-07 DIAGNOSIS — R6520 Severe sepsis without septic shock: Secondary | ICD-10-CM | POA: Diagnosis not present

## 2014-04-07 DIAGNOSIS — N179 Acute kidney failure, unspecified: Secondary | ICD-10-CM

## 2014-04-07 DIAGNOSIS — G92 Toxic encephalopathy: Secondary | ICD-10-CM | POA: Diagnosis not present

## 2014-04-07 DIAGNOSIS — N10 Acute tubulo-interstitial nephritis: Secondary | ICD-10-CM

## 2014-04-07 DIAGNOSIS — Z720 Tobacco use: Secondary | ICD-10-CM | POA: Diagnosis not present

## 2014-04-07 DIAGNOSIS — K089 Disorder of teeth and supporting structures, unspecified: Secondary | ICD-10-CM | POA: Diagnosis not present

## 2014-04-07 DIAGNOSIS — I340 Nonrheumatic mitral (valve) insufficiency: Secondary | ICD-10-CM | POA: Diagnosis not present

## 2014-04-07 DIAGNOSIS — J449 Chronic obstructive pulmonary disease, unspecified: Secondary | ICD-10-CM | POA: Diagnosis not present

## 2014-04-07 DIAGNOSIS — E440 Moderate protein-calorie malnutrition: Secondary | ICD-10-CM | POA: Diagnosis not present

## 2014-04-07 DIAGNOSIS — I5043 Acute on chronic combined systolic (congestive) and diastolic (congestive) heart failure: Secondary | ICD-10-CM | POA: Diagnosis not present

## 2014-04-07 LAB — BASIC METABOLIC PANEL
Anion gap: 15 (ref 5–15)
BUN: 53 mg/dL — AB (ref 6–23)
CO2: 24 mEq/L (ref 19–32)
CREATININE: 1.48 mg/dL — AB (ref 0.50–1.10)
Calcium: 8.9 mg/dL (ref 8.4–10.5)
Chloride: 102 mEq/L (ref 96–112)
GFR calc non Af Amer: 33 mL/min — ABNORMAL LOW (ref >90)
GFR, EST AFRICAN AMERICAN: 38 mL/min — AB (ref >90)
Glucose, Bld: 96 mg/dL (ref 70–99)
POTASSIUM: 3.4 meq/L — AB (ref 3.7–5.3)
Sodium: 141 mEq/L (ref 137–147)

## 2014-04-07 LAB — URINE CULTURE
CULTURE: NO GROWTH
Colony Count: NO GROWTH

## 2014-04-07 LAB — SODIUM, URINE, RANDOM: Sodium, Ur: 46 mEq/L

## 2014-04-07 LAB — MAGNESIUM: Magnesium: 2.2 mg/dL (ref 1.5–2.5)

## 2014-04-07 LAB — CREATININE, URINE, RANDOM: Creatinine, Urine: 96.23 mg/dL

## 2014-04-07 MED ORDER — IPRATROPIUM-ALBUTEROL 18-103 MCG/ACT IN AERO
2.0000 | INHALATION_SPRAY | Freq: Four times a day (QID) | RESPIRATORY_TRACT | Status: DC
Start: 2014-04-07 — End: 2014-04-07

## 2014-04-07 MED ORDER — SODIUM CHLORIDE 0.9 % IV SOLN
INTRAVENOUS | Status: AC
  Administered 2014-04-07: 75 mL/h via INTRAVENOUS
  Administered 2014-04-07: 1000 mL via INTRAVENOUS

## 2014-04-07 MED ORDER — IPRATROPIUM-ALBUTEROL 0.5-2.5 (3) MG/3ML IN SOLN
3.0000 mL | Freq: Four times a day (QID) | RESPIRATORY_TRACT | Status: DC
  Administered 2014-04-07 (×2): 3 mL via RESPIRATORY_TRACT
  Filled 2014-04-07 (×2): qty 3

## 2014-04-07 MED ORDER — INFLUENZA VAC SPLIT QUAD 0.5 ML IM SUSY
0.5000 mL | PREFILLED_SYRINGE | INTRAMUSCULAR | Status: AC
  Administered 2014-04-08: 0.5 mL via INTRAMUSCULAR
  Filled 2014-04-07: qty 0.5

## 2014-04-07 MED ORDER — DIAZEPAM 5 MG PO TABS
5.0000 mg | ORAL_TABLET | Freq: Three times a day (TID) | ORAL | Status: DC
  Administered 2014-04-07 – 2014-04-15 (×25): 5 mg via ORAL
  Filled 2014-04-07 (×25): qty 1

## 2014-04-07 MED ORDER — HYDRALAZINE HCL 20 MG/ML IJ SOLN
10.0000 mg | INTRAMUSCULAR | Status: DC | PRN
Start: 2014-04-07 — End: 2014-04-15
  Administered 2014-04-07 – 2014-04-14 (×6): 10 mg via INTRAVENOUS
  Filled 2014-04-07 (×6): qty 1

## 2014-04-07 MED ORDER — PNEUMOCOCCAL VAC POLYVALENT 25 MCG/0.5ML IJ INJ
0.5000 mL | INJECTION | INTRAMUSCULAR | Status: AC
  Administered 2014-04-08: 0.5 mL via INTRAMUSCULAR
  Filled 2014-04-07: qty 0.5

## 2014-04-07 MED ORDER — HYDRALAZINE HCL 20 MG/ML IJ SOLN
10.0000 mg | Freq: Once | INTRAMUSCULAR | Status: AC
  Administered 2014-04-07: 10 mg via INTRAVENOUS
  Filled 2014-04-07: qty 1

## 2014-04-07 MED ORDER — LABETALOL HCL 300 MG PO TABS
300.0000 mg | ORAL_TABLET | Freq: Two times a day (BID) | ORAL | Status: DC
  Administered 2014-04-07 – 2014-04-12 (×11): 300 mg via ORAL
  Filled 2014-04-07 (×14): qty 1

## 2014-04-07 MED ORDER — ACETAMINOPHEN 325 MG PO TABS
650.0000 mg | ORAL_TABLET | Freq: Once | ORAL | Status: AC
  Administered 2014-04-07: 650 mg via ORAL
  Filled 2014-04-07: qty 2

## 2014-04-07 MED ORDER — IBUPROFEN 400 MG PO TABS
400.0000 mg | ORAL_TABLET | Freq: Three times a day (TID) | ORAL | Status: DC
  Filled 2014-04-07: qty 1

## 2014-04-07 MED ORDER — DEXTROSE 5 % IV SOLN
1.0000 g | INTRAVENOUS | Status: DC
  Administered 2014-04-07: 1 g via INTRAVENOUS
  Filled 2014-04-07: qty 10

## 2014-04-07 MED ORDER — SODIUM CHLORIDE 0.9 % IV BOLUS (SEPSIS)
1000.0000 mL | Freq: Once | INTRAVENOUS | Status: AC
  Administered 2014-04-07: 1000 mL via INTRAVENOUS

## 2014-04-07 MED ORDER — DIAZEPAM 5 MG PO TABS
5.0000 mg | ORAL_TABLET | Freq: Three times a day (TID) | ORAL | Status: DC
  Filled 2014-04-07: qty 1

## 2014-04-07 MED ORDER — ONDANSETRON HCL 4 MG PO TABS: 4.0000 mg | ORAL_TABLET | Freq: Four times a day (QID) | ORAL | Status: DC | PRN

## 2014-04-07 MED ORDER — OXYCODONE-ACETAMINOPHEN 5-325 MG PO TABS
1.0000 | ORAL_TABLET | ORAL | Status: DC | PRN
  Administered 2014-04-07 – 2014-04-08 (×3): 2 via ORAL
  Administered 2014-04-09: 1 via ORAL
  Administered 2014-04-10: 2 via ORAL
  Administered 2014-04-11: 1 via ORAL
  Administered 2014-04-11 – 2014-04-13 (×5): 2 via ORAL
  Administered 2014-04-13: 1 via ORAL
  Administered 2014-04-13: 2 via ORAL
  Administered 2014-04-13 – 2014-04-14 (×4): 1 via ORAL
  Filled 2014-04-07: qty 1
  Filled 2014-04-07 (×3): qty 2
  Filled 2014-04-07: qty 1
  Filled 2014-04-07: qty 2
  Filled 2014-04-07: qty 1
  Filled 2014-04-07 (×5): qty 2
  Filled 2014-04-07 (×3): qty 1
  Filled 2014-04-07: qty 2
  Filled 2014-04-07: qty 1

## 2014-04-07 MED ORDER — IBUPROFEN 400 MG PO TABS
400.0000 mg | ORAL_TABLET | Freq: Three times a day (TID) | ORAL | Status: DC
  Administered 2014-04-07: 400 mg via ORAL

## 2014-04-07 MED ORDER — ENOXAPARIN SODIUM 30 MG/0.3ML ~~LOC~~ SOLN
30.0000 mg | SUBCUTANEOUS | Status: DC
  Administered 2014-04-07: 30 mg via SUBCUTANEOUS
  Filled 2014-04-07 (×3): qty 0.3

## 2014-04-07 MED ORDER — IPRATROPIUM-ALBUTEROL 18-103 MCG/ACT IN AERO: 2.0000 | INHALATION_SPRAY | Freq: Four times a day (QID) | RESPIRATORY_TRACT | Status: DC

## 2014-04-07 MED ORDER — ONDANSETRON HCL 4 MG/2ML IJ SOLN: 4.0000 mg | Freq: Four times a day (QID) | INTRAMUSCULAR | Status: DC | PRN

## 2014-04-07 MED ORDER — DEXTROSE 5 % IV SOLN
1.0000 g | INTRAVENOUS | Status: DC
  Administered 2014-04-07 – 2014-04-08 (×2): 1 g via INTRAVENOUS
  Filled 2014-04-07 (×3): qty 10

## 2014-04-07 MED ORDER — POTASSIUM CHLORIDE CRYS ER 20 MEQ PO TBCR
40.0000 meq | EXTENDED_RELEASE_TABLET | Freq: Once | ORAL | Status: AC
  Administered 2014-04-07: 40 meq via ORAL
  Filled 2014-04-07: qty 2

## 2014-04-07 MED ORDER — VANCOMYCIN HCL 500 MG IV SOLR
500.0000 mg | INTRAVENOUS | Status: DC
  Administered 2014-04-07: 500 mg via INTRAVENOUS
  Filled 2014-04-07 (×2): qty 500

## 2014-04-07 MED ORDER — SODIUM CHLORIDE 0.9 % IV SOLN
INTRAVENOUS | Status: DC
  Administered 2014-04-07 – 2014-04-09 (×5): via INTRAVENOUS
  Filled 2014-04-07 (×8): qty 1000

## 2014-04-07 NOTE — ED Notes (Signed)
Back from [place], attempted to re-position for comfort, pt's R leg uncomfortable, c/o back pain, worse with movement, 5/10 lying still. Denies other sx.

## 2014-04-07 NOTE — ED Notes (Signed)
Dr. Shanon Brow notified of BP of 202/76 manual.  Verbal order given for 10mg  of Hydralazine.  Pt currently sleeping; nad.

## 2014-04-07 NOTE — Progress Notes (Signed)
NURSING PROGRESS NOTE  Lisa [name] [id number] Admission Data: 04/07/2014 3:20 PM Attending Provider: Eugenie Filler, MD WUJ:WJXBJ,[id number], MD Code Status: full  Allergies:  Review of patient's allergies indicates no known allergies. Past Medical History:   has a past medical history of COPD (chronic obstructive pulmonary disease); Hypertension; and Pyelonephritis (04/06/2014). Past Surgical History:   has past surgical history that includes Abdominal hysterectomy. Social History:   reports that she has been smoking Cigarettes.  She has been smoking about 0.30 packs per day. She has never used smokeless tobacco. She reports that she does not drink alcohol or use illicit drugs.  Lisa [name] is a 79 y.o. female patient admitted from ED:   Last Documented Vital Signs: Blood pressure 126/44, pulse 70, temperature 98.6 F (37 C), temperature source Oral, resp. rate 22, height 5\' 7"  (1.702 m), weight 54.795 kg (120 lb 12.8 oz), SpO2 96.00%.   IV Fluids:  IV in place, occlusive dsg intact without redness, IV cath forearm right, condition patent and no redness. normal saline.   Skin: intact   Patient/Family orientated to room. Information packet given to patient/family. Admission inpatient armband information verified with patient/family to include name and date of birth and placed on patient arm. Side rails up x 2, fall assessment and education completed with patient/family. Patient/family able to verbalize understanding of risk associated with falls and verbalized understanding to call for assistance before getting out of bed. Call light within reach. Patient/family able to voice and demonstrate understanding of unit orientation instructions.    Will continue to evaluate and treat per MD orders.   Lisa Hy, MSN, RN, [employer]

## 2014-04-07 NOTE — ED Notes (Signed)
RR 30, states, " feel like I need my blower (neb tx)", 98% RA, expiritory wheeze noted.

## 2014-04-07 NOTE — Progress Notes (Addendum)
[facility] PROGRESS NOTE  Lisa [name] ALP:[id number] DOB: 01-Aug-1934 DOA: 04/06/2014 PCP: [name], MD  Assessment/Plan: #1 ??acute pyelonephritis//UTI Patient present with bilateral CVA tenderness, suprapubic pain, dysuria. Renal ultrasound with trace perinephric fluid and negative for hydronephrosis or focal renal lesion. Urinalysis consistent with a UTI. Urine cultures pending. Continue IV fluids, pain management, IV Rocephin. Follow.  #2 dehydration IV fluids.  #3 acute renal failure Likely secondary to prerenal azotemia in the setting of ARB, diuretics, NSAIDs. Check a urine sodium. Check a urine creatinine. Renal ultrasound with trace perinephric fluid and negative for hydronephrosis. Will hold ARB, diuretics and NSAIDs. Continue IV fluids. Follow.  #4 COPD Stable. Resume home regimen of Combivent. Place on nebs as needed.  #5 hypertension Continue home regimen labetalol. Also cardizem.  #6 leukocytosis Likely secondary to problem #1. Urine cultures pending. Blood cultures pending. Place on IV Rocephin.  #7 prophylaxis PPI for GI prophylaxis. Lovenox for DVT prophylaxis.  Code Status: Full Family Communication: Updated patient no family present. Disposition Plan: Remain in patient.   Consultants:  None  Procedures:  Renal US 04/07/14  Chest x-ray 04/06/2014  Antibiotics:  IV Zosyn 04/06/14>>>04/07/14  IV Rocephin 04/07/14  HPI/Subjective: Patient with no complaints. Patient states back pain improving.  Objective: Filed Vitals:   04/07/14 0815  BP: 134/53  Pulse: 66  Temp:   Resp: 22    Intake/Output Summary (Last 24 hours) at 04/07/14 0924 Last data filed at 04/07/14 0323  Gross per 24 hour  Intake   1050 ml  Output      0 ml  Net   1050 ml   Filed Weights   04/06/14 1458  Weight: 57.153 kg (126 lb)    Exam:   General:  NAD  Cardiovascular: RRR  Respiratory: CTAB  Abdomen: Soft, nontender, nondistended, positive  bowel sounds. Slight bilateral CVA tenderness to palpation.  Musculoskeletal: No clubbing cyanosis or edema   Data Reviewed: Basic Metabolic Panel:  Recent Labs Lab 04/06/14 1530  NA 137  K 4.3  CL 97  CO2 23  GLUCOSE 107*  BUN 54*  CREATININE 1.54*  CALCIUM 10.1   Liver Function Tests:  Recent Labs Lab 04/06/14 1530  AST 29  ALT 20  ALKPHOS 78  BILITOT 0.6  PROT 8.5*  ALBUMIN 2.9*   No results found for this basename: LIPASE, AMYLASE,  in the last 168 hours No results found for this basename: AMMONIA,  in the last 168 hours CBC:  Recent Labs Lab 04/06/14 1530  WBC 14.2*  NEUTROABS 11.6*  HGB 13.3  HCT 39.5  MCV 89.4  PLT 183   Cardiac Enzymes: No results found for this basename: CKTOTAL, CKMB, CKMBINDEX, TROPONINI,  in the last 168 hours BNP (last 3 results) No results found for this basename: PROBNP,  in the last 8760 hours CBG: No results found for this basename: GLUCAP,  in the last 168 hours  No results found for this or any previous visit (from the past 240 hour(s)).   Studies: US Renal  04/07/2014   CLINICAL DATA:  Flank pain, right side.  EXAM: RENAL/URINARY TRACT ULTRASOUND COMPLETE  COMPARISON:  12/31/2008 CT  FINDINGS: Right Kidney:  Length: 11.2 cm. Echogenicity within normal limits. No mass or hydronephrosis visualized. Trace perinephric fluid.  Left Kidney:  Length: 11.2 cm. Echogenicity within normal limits. No mass or hydronephrosis visualized. Trace perinephric fluid.  Bladder:  Appears normal for degree of bladder distention.  Other:  Gallstones incidentally noted.  Trace perisplenic  fluid.  IMPRESSION: No hydronephrosis or focal renal lesion.  Trace perinephric fluid can be incidental, seen with medical renal disease, or seen with ascending infection. Correlate with creatinine and urinalysis.  Cholelithiasis. Correlate with right upper quadrant ultrasound if concerned for acute biliary pathology.  Trace ascites.   Electronically Signed   By:  Carlos Levering M.D.   On: 04/07/2014 03:11   Dg Chest Portable 1 View  04/06/2014   CLINICAL DATA:  Fever, cough.  EXAM: PORTABLE CHEST - 1 VIEW  COMPARISON:  October 08, 2012.  FINDINGS: The heart size and mediastinal contours are within normal limits. Both lungs are clear. No pneumothorax or pleural effusion is noted. The visualized skeletal structures are unremarkable.  IMPRESSION: No acute cardiopulmonary abnormality seen.   Electronically Signed   By: Sabino Dick M.D.   On: 04/06/2014 19:16    Scheduled Meds: . diazepam  5 mg Oral TID  . diltiazem  120 mg Oral Daily  . enoxaparin (LOVENOX) injection  30 mg Subcutaneous Q24H  . ibuprofen  400 mg Oral TID  . ipratropium-albuterol  3 mL Inhalation QID  . labetalol  300 mg Oral BID   Continuous Infusions: . sodium chloride 75 mL/hr (04/07/14 0320)  . cefTRIAXone (ROCEPHIN)  IV Stopped (04/07/14 0323)    Principal Problem:   Pyelonephritis, acute Active Problems:   Hypertension   COPD (chronic obstructive pulmonary disease)   Fever   UTI (lower urinary tract infection)   ARF (acute renal failure)   Leukocytosis   Dehydration    Time spent: 35 mins    [facility] MD [facility] Pager [phone]. If 7PM-7AM, please contact night-coverage at [email or web address], password [facility] 04/07/2014, 9:24 AM  LOS: 1 day

## 2014-04-07 NOTE — ED Notes (Signed)
Family has arrived at bedside; pt given a washcloth to wash her face with

## 2014-04-07 NOTE — Progress Notes (Signed)
UR completed 

## 2014-04-07 NOTE — Progress Notes (Signed)
ANTIBIOTIC CONSULT NOTE - INITIAL  Pharmacy Consult for Vancomycin Indication: sepsis -- GPC in BC x 2  No Known Allergies  Patient Measurements: Height: 5\' 7"  (170.2 cm) Weight: 120 lb 12.8 oz (54.795 kg) IBW/kg (Calculated) : 61.6  Vital Signs: Temp: 98.6 F (37 C) (10/27 1232) Temp Source: Oral (10/27 1232) BP: 126/44 mmHg (10/27 1232) Pulse Rate: 70 (10/27 1232) Intake/Output from previous day: 10/26 0701 - 10/27 0700 In: 1050 [I.V.:1000; IV Piggyback:50] Out: -    Labs:  Recent Labs  04/06/14 1530 04/06/14 1820 04/07/14 0850  WBC 14.2*  --   --   HGB 13.3  --   --   PLT 183  --   --   LABCREA  --  96.23  --   CREATININE 1.54*  --  1.48*   Estimated Creatinine Clearance: 27.5 ml/min (by C-G formula based on Cr of 1.48). No results found for this basename: VANCOTROUGH, Corlis Leak, VANCORANDOM, [place], GENTPEAK, [place], [place], [place], TOBRARND, AMIKACINPEAK, AMIKACINTROU, AMIKACIN,  in the last 72 hours   Microbiology: Recent Results (from the past 720 hour(s))  CULTURE, BLOOD (ROUTINE X 2)     Status: None   Collection Time    04/06/14  6:02 PM      Result Value Ref Range Status   Specimen Description BLOOD RIGHT ANTECUBITAL   Final   Special Requests BOTTLES DRAWN AEROBIC AND ANAEROBIC 5CC   Final   Culture  Setup Time     Final   Value: 04/07/2014 01:06     Performed at [facility]   Culture     Final   Value: [place] IN CHAINS     Note: Gram Stain Report Called to,Read Back By and Verified With: Joslyn Hy RN [phone]     Performed at [facility]   Report Status PENDING   Incomplete  URINE CULTURE     Status: None   Collection Time    04/06/14  6:20 PM      Result Value Ref Range Status   Specimen Description URINE, CATHETERIZED   Final   Special Requests NONE   Final   Culture  Setup Time     Final   Value: 04/06/2014 18:55     Performed at [place]     Final   Value: NO  GROWTH     Performed at [facility]   Culture     Final   Value: NO GROWTH     Performed at [facility]   Report Status 04/07/2014 FINAL   Final    Medical History: Past Medical History  Diagnosis Date  . COPD (chronic obstructive pulmonary disease)   . Hypertension   . Pyelonephritis 04/06/2014    UTI    Assessment: 79 year old female admitted with ? Acute pyelonephritis/UTI and acute kidney renal failure found to have GPC in her blood cultures.  Pharmacy asked to begin vancomycin pending identification.  Also receiving Rocephin.  Goal of Therapy:  Vancomycin trough level 15-20 mcg/ml  Plan:  Vancomycin 500 mg iv Q 24 hours Rocephin 1 gram iv Q 24 hours Follow up fever trend, cultures, Scr  Thank you. Anette Guarneri, PharmD [phone]  04/07/2014,7:45 PM

## 2014-04-07 NOTE — ED Notes (Signed)
Pt sleeping/resting, NAD, calm, VSS/improved.

## 2014-04-07 NOTE — ED Notes (Signed)
Pt resting at this time.

## 2014-04-08 DIAGNOSIS — A408 Other streptococcal sepsis: Secondary | ICD-10-CM | POA: Diagnosis not present

## 2014-04-08 DIAGNOSIS — A491 Streptococcal infection, unspecified site: Secondary | ICD-10-CM | POA: Diagnosis present

## 2014-04-08 LAB — CBC WITH DIFFERENTIAL/PLATELET
Basophils Absolute: 0 10*3/uL (ref 0.0–0.1)
Basophils Relative: 0 % (ref 0–1)
EOS ABS: 0 10*3/uL (ref 0.0–0.7)
Eosinophils Relative: 0 % (ref 0–5)
HCT: 33.5 % — ABNORMAL LOW (ref 36.0–46.0)
HEMOGLOBIN: 11.1 g/dL — AB (ref 12.0–15.0)
Lymphocytes Relative: 13 % (ref 12–46)
Lymphs Abs: 1.4 10*3/uL (ref 0.7–4.0)
MCH: 29.8 pg (ref 26.0–34.0)
MCHC: 33.1 g/dL (ref 30.0–36.0)
MCV: 90.1 fL (ref 78.0–100.0)
Monocytes Absolute: 1.1 10*3/uL — ABNORMAL HIGH (ref 0.1–1.0)
Monocytes Relative: 10 % (ref 3–12)
NEUTROS ABS: 8.5 10*3/uL — AB (ref 1.7–7.7)
Neutrophils Relative %: 77 % (ref 43–77)
Platelets: 183 10*3/uL (ref 150–400)
RBC: 3.72 MIL/uL — ABNORMAL LOW (ref 3.87–5.11)
RDW: 13.6 % (ref 11.5–15.5)
WBC: 11 10*3/uL — ABNORMAL HIGH (ref 4.0–10.5)

## 2014-04-08 LAB — BASIC METABOLIC PANEL
Anion gap: 12 (ref 5–15)
BUN: 38 mg/dL — AB (ref 6–23)
CHLORIDE: 109 meq/L (ref 96–112)
CO2: 20 meq/L (ref 19–32)
Calcium: 8.6 mg/dL (ref 8.4–10.5)
Creatinine, Ser: 1.1 mg/dL (ref 0.50–1.10)
GFR calc Af Amer: 55 mL/min — ABNORMAL LOW (ref >90)
GFR calc non Af Amer: 47 mL/min — ABNORMAL LOW (ref >90)
Glucose, Bld: 104 mg/dL — ABNORMAL HIGH (ref 70–99)
Potassium: 4.5 mEq/L (ref 3.7–5.3)
Sodium: 141 mEq/L (ref 137–147)

## 2014-04-08 MED ORDER — IPRATROPIUM-ALBUTEROL 0.5-2.5 (3) MG/3ML IN SOLN
3.0000 mL | Freq: Four times a day (QID) | RESPIRATORY_TRACT | Status: DC
  Administered 2014-04-08 – 2014-04-11 (×12): 3 mL via RESPIRATORY_TRACT
  Filled 2014-04-08 (×14): qty 3

## 2014-04-08 MED ORDER — ALBUTEROL SULFATE (2.5 MG/3ML) 0.083% IN NEBU
2.5000 mg | INHALATION_SOLUTION | RESPIRATORY_TRACT | Status: DC | PRN
  Administered 2014-04-08: 2.5 mg via RESPIRATORY_TRACT

## 2014-04-08 MED ORDER — VANCOMYCIN HCL IN DEXTROSE 750-5 MG/150ML-% IV SOLN
750.0000 mg | INTRAVENOUS | Status: DC
  Administered 2014-04-08: 750 mg via INTRAVENOUS
  Filled 2014-04-08 (×3): qty 150

## 2014-04-08 MED ORDER — ENOXAPARIN SODIUM 40 MG/0.4ML ~~LOC~~ SOLN
40.0000 mg | SUBCUTANEOUS | Status: DC
  Administered 2014-04-08 – 2014-04-14 (×7): 40 mg via SUBCUTANEOUS
  Filled 2014-04-08 (×8): qty 0.4

## 2014-04-08 NOTE — Progress Notes (Signed)
PROGRESS NOTE  Lisa [name] XFG:[id number] DOB: Jan 26, 1935 DOA: 04/06/2014 PCP: [name], MD  HPI/Recap of past 53 hours: 79 year old female admitted 10/26 with fever and back pain and perinephric stranding suspicious for pyelonephritis seen on CT. Patient started on IV antibiotics. Urine cultures with no growth, however blood culture 2 noting gram-positive cocci in chains. Patient white count improving although still having low-grade temperature.  Patient doing okay. Still feels weak. Back pain persisting some.  Assessment/Plan: Principal Problem:   Pyelonephritis, acute: Still suspect it is primary cause. Continued about accident awaiting further blood culture delineation. Renal ultrasound notes no evidence of hydronephrosis Active Problems:   Hypertension: Stable. With IV fluids, this is since improved   COPD (chronic obstructive pulmonary disease)   Fever: Still some persisting temps. Continue to monitor sources of infection.   UTI (lower urinary tract infection): No growth from urine cultures. Awaiting blood culture.   ARF (acute renal failure) colon secondary to UTI and dehydration, improved with IV fluids   Leukocytosis: Improved with antibiotics.    Dehydration: Improved with IV fluids.    Bacteremia: As above   Code Status: Full code  Family Communication: Left message with husband  Disposition Plan: Awaiting PT evaluation, further blood culture delineation. Possibly home next 1-2 days   Consultants:  None  Procedures:  None  Antibiotics:  IV Rocephin 10/27-present  IV Zosyn 10/26-10/27   Objective: BP 147/48  Pulse 66  Temp(Src) 98.7 F (37.1 C) (Oral)  Resp 16  Ht 5\' 7"  (1.702 m)  Wt 56.4 kg (124 lb 5.4 oz)  BMI 19.47 kg/m2  SpO2 100%  Intake/Output Summary (Last 24 hours) at 04/08/14 1627 Last data filed at 04/08/14 0925  Gross per 24 hour  Intake 2513.75 ml  Output      0 ml  Net 2513.75 ml   Filed Weights   04/06/14 1458  04/07/14 1232 04/08/14 0534  Weight: 57.153 kg (126 lb) 54.795 kg (120 lb 12.8 oz) 56.4 kg (124 lb 5.4 oz)    Exam:   General:  Alert and oriented 2, no acute distress  Cardiovascular: Regular rate and rhythm, S1-S2  Respiratory: Clear to auscultation bilaterally  Abdomen: Soft, nontender, nondistended, positive bowel sounds, mild left flank pain  Musculoskeletal: No clubbing or cyanosis or edema   Data Reviewed: Basic Metabolic Panel:  Recent Labs Lab 04/06/14 1530 04/07/14 0850 04/08/14 0612  NA 137 141 141  K 4.3 3.4* 4.5  CL 97 102 109  CO2 23 24 20   GLUCOSE 107* 96 104*  BUN 54* 53* 38*  CREATININE 1.54* 1.48* 1.10  CALCIUM 10.1 8.9 8.6  MG  --  2.2  --    Liver Function Tests:  Recent Labs Lab 04/06/14 1530  AST 29  ALT 20  ALKPHOS 78  BILITOT 0.6  PROT 8.5*  ALBUMIN 2.9*   No results found for this basename: LIPASE, AMYLASE,  in the last 168 hours No results found for this basename: AMMONIA,  in the last 168 hours CBC:  Recent Labs Lab 04/06/14 1530 04/08/14 0612  WBC 14.2* 11.0*  NEUTROABS 11.6* 8.5*  HGB 13.3 11.1*  HCT 39.5 33.5*  MCV 89.4 90.1  PLT 183 183   Cardiac Enzymes:   No results found for this basename: CKTOTAL, CKMB, CKMBINDEX, TROPONINI,  in the last 168 hours BNP (last 3 results) No results found for this basename: PROBNP,  in the last 8760 hours CBG: No results found for this basename: GLUCAP,  in  the last 168 hours  Recent Results (from the past 240 hour(s))  CULTURE, BLOOD (ROUTINE X 2)     Status: None   Collection Time    04/06/14  5:55 PM      Result Value Ref Range Status   Specimen Description BLOOD RIGHT FOREARM   Final   Special Requests BOTTLES DRAWN AEROBIC ONLY 2CC   Final   Culture  Setup Time     Final   Value: 04/07/2014 01:06     Performed at [facility]   Culture     Final   Value: GRAM POSITIVE COCCI IN CHAINS     Note: Gram Stain Report Called to,Read Back By and Verified With:  Amaryllis Dyke RN 900P     Performed at [facility]   Report Status PENDING   Incomplete  CULTURE, BLOOD (ROUTINE X 2)     Status: None   Collection Time    04/06/14  6:02 PM      Result Value Ref Range Status   Specimen Description BLOOD RIGHT ANTECUBITAL   Final   Special Requests BOTTLES DRAWN AEROBIC AND ANAEROBIC 5CC   Final   Culture  Setup Time     Final   Value: 04/07/2014 01:06     Performed at [facility]   Culture     Final   Value: GRAM POSITIVE COCCI IN CHAINS     Note: Gram Stain Report Called to,Read Back By and Verified With: Joslyn Hy RN [phone]     Performed at [facility]   Report Status PENDING   Incomplete  URINE CULTURE     Status: None   Collection Time    04/06/14  6:20 PM      Result Value Ref Range Status   Specimen Description URINE, CATHETERIZED   Final   Special Requests NONE   Final   Culture  Setup Time     Final   Value: 04/06/2014 18:55     Performed at [place]     Final   Value: NO GROWTH     Performed at [facility]   Culture     Final   Value: NO GROWTH     Performed at [facility]   Report Status 04/07/2014 FINAL   Final     Studies: US Renal  04/07/2014   CLINICAL DATA:  Flank pain, right side.  EXAM: RENAL/URINARY TRACT ULTRASOUND COMPLETE  COMPARISON:  12/31/2008 CT  FINDINGS: Right Kidney:  Length: 11.2 cm. Echogenicity within normal limits. No mass or hydronephrosis visualized. Trace perinephric fluid.  Left Kidney:  Length: 11.2 cm. Echogenicity within normal limits. No mass or hydronephrosis visualized. Trace perinephric fluid.  Bladder:  Appears normal for degree of bladder distention.  Other:  Gallstones incidentally noted.  Trace perisplenic fluid.  IMPRESSION: No hydronephrosis or focal renal lesion.  Trace perinephric fluid can be incidental, seen with medical renal disease, or seen with ascending infection. Correlate with creatinine and urinalysis.   Cholelithiasis. Correlate with right upper quadrant ultrasound if concerned for acute biliary pathology.  Trace ascites.   Electronically Signed   By: Carlos Levering M.D.   On: 04/07/2014 03:11    Scheduled Meds: . cefTRIAXone (ROCEPHIN)  IV  1 g Intravenous Q24H  . diazepam  5 mg Oral TID  . diltiazem  120 mg Oral Daily  . enoxaparin (LOVENOX) injection  40 mg Subcutaneous Q24H  . ipratropium-albuterol  3 mL Inhalation QID  . labetalol  300 mg Oral BID  . vancomycin  750 mg Intravenous Q24H    Continuous Infusions: . sodium chloride 0.9 % 1,000 mL with potassium chloride 40 mEq infusion 125 mL/hr at 04/08/14 1135     Time spent: 25 minutes  [facility] Pager [phone]. If 7PM-7AM, please contact night-coverage at [email or web address], password [facility] 04/08/2014, 4:27 PM  LOS: 2 days

## 2014-04-08 NOTE — Progress Notes (Signed)
ANTIBIOTIC CONSULT NOTE - FOLLOW UP  Pharmacy Consult for vancomycin Indication: sepsis  No Known Allergies  Patient Measurements: Height: 5\' 7"  (170.2 cm) Weight: 124 lb 5.4 oz (56.4 kg) IBW/kg (Calculated) : 61.6  Vital Signs: Temp: 100.7 F (38.2 C) (10/28 0534) Temp Source: Oral (10/28 0534) BP: 164/58 mmHg (10/28 0946) Pulse Rate: 64 (10/28 0946) Intake/Output from previous day: 10/27 0701 - 10/28 0700 In: 2633.8 [P.O.:390; I.V.:2093.8; IV Piggyback:150] Out: -  Intake/Output from this shift: Total I/O In: 120 [P.O.:120] Out: -   Labs:  Recent Labs  04/06/14 1530 04/06/14 1820 04/07/14 0850 04/08/14 0612  WBC 14.2*  --   --  11.0*  HGB 13.3  --   --  11.1*  PLT 183  --   --  183  LABCREA  --  96.23  --   --   CREATININE 1.54*  --  1.48* 1.10   Estimated Creatinine Clearance: 38.1 ml/min (by C-G formula based on Cr of 1.1). No results found for this basename: VANCOTROUGH, Corlis Leak, VANCORANDOM, [place], GENTPEAK, [place], [place], [place], TOBRARND, AMIKACINPEAK, AMIKACINTROU, AMIKACIN,  in the last 72 hours   Microbiology: Recent Results (from the past 720 hour(s))  CULTURE, BLOOD (ROUTINE X 2)     Status: None   Collection Time    04/06/14  5:55 PM      Result Value Ref Range Status   Specimen Description BLOOD RIGHT FOREARM   Final   Special Requests BOTTLES DRAWN AEROBIC ONLY [place]   Final   Culture  Setup Time     Final   Value: 04/07/2014 01:06     Performed at [facility]   Culture     Final   Value: GRAM POSITIVE COCCI IN CHAINS     Note: Gram Stain Report Called to,Read Back By and Verified With: Amaryllis Dyke RN 900P     Performed at [facility]   Report Status PENDING   Incomplete  CULTURE, BLOOD (ROUTINE X 2)     Status: None   Collection Time    04/06/14  6:02 PM      Result Value Ref Range Status   Specimen Description BLOOD RIGHT ANTECUBITAL   Final   Special Requests BOTTLES DRAWN AEROBIC AND ANAEROBIC  5CC   Final   Culture  Setup Time     Final   Value: 04/07/2014 01:06     Performed at [facility]   Culture     Final   Value: GRAM POSITIVE COCCI IN CHAINS     Note: Gram Stain Report Called to,Read Back By and Verified With: Joslyn Hy RN [phone]     Performed at [facility]   Report Status PENDING   Incomplete  URINE CULTURE     Status: None   Collection Time    04/06/14  6:20 PM      Result Value Ref Range Status   Specimen Description URINE, CATHETERIZED   Final   Special Requests NONE   Final   Culture  Setup Time     Final   Value: 04/06/2014 18:55     Performed at [place]     Final   Value: NO GROWTH     Performed at [facility]   Culture     Final   Value: NO GROWTH     Performed at [facility]   Report Status 04/07/2014 FINAL   Final    Anti-infectives  Start     Dose/Rate Route Frequency Ordered Stop   04/08/14 2100  vancomycin (VANCOCIN) IVPB 750 mg/150 ml premix     750 mg 150 mL/hr over 60 Minutes Intravenous Every 24 hours 04/08/14 1330     04/07/14 2200  cefTRIAXone (ROCEPHIN) 1 g in dextrose 5 % 50 mL IVPB     1 g 100 mL/hr over 30 Minutes Intravenous Every 24 hours 04/07/14 1232     04/07/14 2100  vancomycin (VANCOCIN) 500 mg in sodium chloride 0.9 % 100 mL IVPB  Status:  Discontinued     500 mg 100 mL/hr over 60 Minutes Intravenous Every 24 hours 04/07/14 1950 04/08/14 1330   04/07/14 0147  cefTRIAXone (ROCEPHIN) 1 g in dextrose 5 % 50 mL IVPB  Status:  Discontinued     1 g 100 mL/hr over 30 Minutes Intravenous Every 24 hours 04/07/14 0147 04/07/14 1232   04/06/14 1900  piperacillin-tazobactam (ZOSYN) IVPB 3.375 g     3.375 g 100 mL/hr over 30 Minutes Intravenous  Once 04/06/14 1851 04/06/14 1938      Assessment: 79 y/o female on day 2 vancomycin for sepsis with 2/2 blood cultures with GPC chains. Renal function has improved with SCr trending down to 1.1. WBC are trending down, Tmax  is 100.7.  Goal of Therapy:  Vancomycin trough level 15-20 mcg/ml  Plan:  - Increase vancomycin to 750 mg IV q24h - Monitor renal function and culture data  [facility], Pharm.D., BCPS Clinical Pharmacist Pager: [phone] 04/08/2014 1:32 PM

## 2014-04-09 ENCOUNTER — Inpatient Hospital Stay (HOSPITAL_COMMUNITY): Payer: Medicare Other

## 2014-04-09 ENCOUNTER — Encounter (HOSPITAL_COMMUNITY): Payer: Self-pay | Admitting: Neurology

## 2014-04-09 DIAGNOSIS — G9340 Encephalopathy, unspecified: Secondary | ICD-10-CM | POA: Diagnosis not present

## 2014-04-09 DIAGNOSIS — R29898 Other symptoms and signs involving the musculoskeletal system: Secondary | ICD-10-CM

## 2014-04-09 LAB — BASIC METABOLIC PANEL
ANION GAP: 14 (ref 5–15)
BUN: 27 mg/dL — ABNORMAL HIGH (ref 6–23)
CO2: 18 mEq/L — ABNORMAL LOW (ref 19–32)
Calcium: 8.9 mg/dL (ref 8.4–10.5)
Chloride: 112 mEq/L (ref 96–112)
Creatinine, Ser: 0.98 mg/dL (ref 0.50–1.10)
GFR calc non Af Amer: 54 mL/min — ABNORMAL LOW (ref >90)
GFR, EST AFRICAN AMERICAN: 63 mL/min — AB (ref >90)
Glucose, Bld: 92 mg/dL (ref 70–99)
Potassium: 5.5 mEq/L — ABNORMAL HIGH (ref 3.7–5.3)
Sodium: 144 mEq/L (ref 137–147)

## 2014-04-09 LAB — AMMONIA: AMMONIA: 30 umol/L (ref 11–60)

## 2014-04-09 LAB — CULTURE, BLOOD (ROUTINE X 2)

## 2014-04-09 LAB — PRO B NATRIURETIC PEPTIDE: Pro B Natriuretic peptide (BNP): 5197 pg/mL — ABNORMAL HIGH (ref 0–450)

## 2014-04-09 LAB — CBC
HCT: 33.1 % — ABNORMAL LOW (ref 36.0–46.0)
Hemoglobin: 10.8 g/dL — ABNORMAL LOW (ref 12.0–15.0)
MCH: 29.3 pg (ref 26.0–34.0)
MCHC: 32.6 g/dL (ref 30.0–36.0)
MCV: 89.9 fL (ref 78.0–100.0)
Platelets: 196 10*3/uL (ref 150–400)
RBC: 3.68 MIL/uL — ABNORMAL LOW (ref 3.87–5.11)
RDW: 13.8 % (ref 11.5–15.5)
WBC: 11.2 10*3/uL — ABNORMAL HIGH (ref 4.0–10.5)

## 2014-04-09 LAB — TSH: TSH: 1.14 u[IU]/mL (ref 0.350–4.500)

## 2014-04-09 MED ORDER — WHITE PETROLATUM GEL
Status: AC
  Administered 2014-04-09: 0.2
  Filled 2014-04-09: qty 5

## 2014-04-09 MED ORDER — DEXTROSE 5 % IV SOLN
2.0000 g | INTRAVENOUS | Status: DC
  Administered 2014-04-09 – 2014-04-14 (×6): 2 g via INTRAVENOUS
  Filled 2014-04-09 (×7): qty 2

## 2014-04-09 MED ORDER — SODIUM CHLORIDE 0.9 % IV SOLN
INTRAVENOUS | Status: DC
Start: 2014-04-09 — End: 2014-04-10
  Administered 2014-04-09 (×2): via INTRAVENOUS

## 2014-04-09 NOTE — Progress Notes (Signed)
PROGRESS NOTE  Lisa [name] KZS:[id number] DOB: 04-Mar-1935 DOA: 04/06/2014 PCP: [name], MD  HPI/Recap of past 48 hours: 79 year old female admitted 10/26 with fever and back pain and perinephric stranding suspicious for pyelonephritis seen on CT. Patient started on IV Rocephin initially. Patient white count improving although still having low-grade temperature. Urine cultures with no growth, but blood cultures times multiple sets grew out gram-positive cocci in chains which by 10/29 positive for Streptococcus viridans. Discussed with infectious disease who increased dose of Rocephin to 2 g daily. Cardiology consult for TEE.  By mid day, physical therapy concern that patient seemed to have decreased strength and sensation on her left upper extremity. Nursing also reported some confusion. CT scan of the head done was unrevealing. Neurology consult at felt the patient's picture perhaps resembled more likely encephalopathy.  Patient herself complains of generalized pain  Assessment/plan Principal problem:   Pyelonephritis, acute: Still suspect it is primary cause. . Renal ultrasound notes no evidence of hydronephrosis, on Rocephin Active Problems:   Hypertension: Initially lower yesterday, so IV fluids increased. Today blood pressures have been as high as the 190s. Have decreased IV fluids and checking BNP   COPD (chronic obstructive pulmonary disease)   Fever: Likely from strep viridans. Rocephin dose increased   UTI (lower urinary tract infection): No growth from urine cultures. Awaiting blood culture.   ARF (acute renal failure): Resolved secondary to UTI and dehydration, improved with IV fluids   Leukocytosis: Improved with antibiotics.    Dehydration: Improved with IV fluids.    Bacteremia: As above   Code Status: Full code  Family Communication: Spoke with patient's son at the bedside  Disposition Plan: Awaiting further workup including TEE and antibiotic  choices   Consultants:  Infectious disease  Neurology  Cardiology  Procedures:  None  Antibiotics:  IV Rocephin 10/27-with dose increased 10/29-present  IV Zosyn 10/26-10/27  IV vancomycin 10/28-10/29   Objective: BP 164/46  Pulse 72  Temp(Src) 98 F (36.7 C) (Oral)  Resp 25  Ht 5\' 7"  (1.702 m)  Wt 59.5 kg (131 lb 2.8 oz)  BMI 20.54 kg/m2  SpO2 98%  Intake/Output Summary (Last 24 hours) at 04/09/14 1747 Last data filed at 04/09/14 1015  Gross per 24 hour  Intake   2395 ml  Output      0 ml  Net   2395 ml   Filed Weights   04/07/14 1232 04/08/14 0534 04/09/14 0500  Weight: 54.795 kg (120 lb 12.8 oz) 56.4 kg (124 lb 5.4 oz) 59.5 kg (131 lb 2.8 oz)    Exam:   General:  Alert and oriented 2, fatigued  Cardiovascular: Regular rate and rhythm, S1-S2  Respiratory: Clear to auscultation bilaterally  Abdomen: Soft, nontender, nondistended, positive bowel sounds, mild left flank pain  Musculoskeletal: No clubbing or cyanosis or edema   Neuro: Nonfocal  Data Reviewed: Basic Metabolic Panel:  Recent Labs Lab 04/06/14 1530 04/07/14 0850 04/08/14 0612 04/09/14 0530  NA 137 141 141 144  K 4.3 3.4* 4.5 5.5*  CL 97 102 109 112  CO2 23 24 20  18*  GLUCOSE 107* 96 104* 92  BUN 54* 53* 38* 27*  CREATININE 1.54* 1.48* 1.10 0.98  CALCIUM 10.1 8.9 8.6 8.9  MG  --  2.2  --   --    Liver Function Tests:  Recent Labs Lab 04/06/14 1530  AST 29  ALT 20  ALKPHOS 78  BILITOT 0.6  PROT 8.5*  ALBUMIN 2.9*  No results found for this basename: LIPASE, AMYLASE,  in the last 168 hours  Recent Labs Lab 04/09/14 1505  AMMONIA 30   CBC:  Recent Labs Lab 04/06/14 1530 04/08/14 0612 04/09/14 0530  WBC 14.2* 11.0* 11.2*  NEUTROABS 11.6* 8.5*  --   HGB 13.3 11.1* 10.8*  HCT 39.5 33.5* 33.1*  MCV 89.4 90.1 89.9  PLT 183 183 196   Cardiac Enzymes:   No results found for this basename: CKTOTAL, CKMB, CKMBINDEX, TROPONINI,  in the last 168  hours BNP (last 3 results) No results found for this basename: PROBNP,  in the last 8760 hours CBG: No results found for this basename: GLUCAP,  in the last 168 hours  Recent Results (from the past 240 hour(s))  CULTURE, BLOOD (ROUTINE X 2)     Status: None   Collection Time    04/06/14  5:55 PM      Result Value Ref Range Status   Specimen Description BLOOD RIGHT FOREARM   Final   Special Requests BOTTLES DRAWN AEROBIC ONLY 2CC   Final   Culture  Setup Time     Final   Value: 04/07/2014 01:06     Performed at [facility]   Culture     Final   Value: VIRIDANS STREPTOCOCCUS     Note: Gram Stain Report Called to,Read Back By and Verified With: Amaryllis Dyke RN 900P     Performed at [facility]   Report Status 04/09/2014 FINAL   Final   Organism ID, Bacteria VIRIDANS STREPTOCOCCUS   Final  CULTURE, BLOOD (ROUTINE X 2)     Status: None   Collection Time    04/06/14  6:02 PM      Result Value Ref Range Status   Specimen Description BLOOD RIGHT ANTECUBITAL   Final   Special Requests BOTTLES DRAWN AEROBIC AND ANAEROBIC 5CC   Final   Culture  Setup Time     Final   Value: 04/07/2014 01:06     Performed at [facility]   Culture     Final   Value: VIRIDANS STREPTOCOCCUS     Note: SUSCEPTIBILITIES PERFORMED ON PREVIOUS CULTURE WITHIN THE LAST 5 DAYS.     Note: Gram Stain Report Called to,Read Back By and Verified With: Joslyn Hy RN [phone]     Performed at [facility]   Report Status 04/09/2014 FINAL   Final  URINE CULTURE     Status: None   Collection Time    04/06/14  6:20 PM      Result Value Ref Range Status   Specimen Description URINE, CATHETERIZED   Final   Special Requests NONE   Final   Culture  Setup Time     Final   Value: 04/06/2014 18:55     Performed at [facility]     Final   Value: NO GROWTH     Performed at [facility]   Culture     Final   Value: NO GROWTH     Performed at [facility]   Report Status 04/07/2014 FINAL   Final     Studies: No results found.  Scheduled Meds: . cefTRIAXone (ROCEPHIN)  IV  2 g Intravenous Q24H  . diazepam  5 mg Oral TID  . diltiazem  120 mg Oral Daily  . enoxaparin (LOVENOX) injection  40 mg Subcutaneous Q24H  . ipratropium-albuterol  3 mL Inhalation QID  .  labetalol  300 mg Oral BID    Continuous Infusions: . sodium chloride 125 mL/hr at 04/09/14 0908     Time spent: 35 minutes  [facility] Pager [phone]. If 7PM-7AM, please contact night-coverage at [email or web address], password [facility] 04/09/2014, 5:47 PM  LOS: 3 days

## 2014-04-09 NOTE — Evaluation (Signed)
Physical Therapy Evaluation Patient Details Name: Lisa [name] MRN: [id number] DOB: September 18, 1934 Today's Date: 04/09/2014   History of Present Illness  79 yo female with several days of rt flank pain worsening now with fever.  Also with suprapubic pain, does not feel well.Has UTI, pyelonephritis  Clinical Impression  Pt with varied ability to maintain attention. Pt with stating she is in the hospital, unaware of which one. Not oriented to month, day or year. Brother present confirming pt normally independent and assisting spouse. Pt currently with intense back pain with all movement unable to tolerate or assist with scooting, standing or pivot back to bed. Pt with change in status from AM per nursing staff and RN and MD made aware. Brother stating pt has been back and forth with mental and physical status. Brother also reporting pt tried to pick up spouse after he fell and has had back pain since. Pt with weakness bil LE, report of numbness and AMS currenlty. Pt will benefit from acute therapy to maximize mobility, function, gait and strength to return pt to PLOF and decrease burden of care.     Follow Up Recommendations Supervision/Assistance - 24 hour    Equipment Recommendations       Recommendations for Other Services Rehab consult;OT consult;Speech consult     Precautions / Restrictions Precautions Precautions: Fall Precaution Comments: questionable need for back precautions given pt current pain. Family reports she also hurt her back trying to pick spouse up after he fell last week      Mobility  Bed Mobility Overal bed mobility: Needs Assistance;+2 for physical assistance Bed Mobility: Sit to Sidelying;Rolling Rolling: Max assist       Sit to sidelying: Total assist;+2 for physical assistance General bed mobility comments: pivoted pt to bed from chair with total assist to return to bed. With rolling onto back pt with left hand under hip with no awareness and total assist  to reposition LUE  Transfers Overall transfer level: Needs assistance Equipment used: None Transfers: Sit to/from [employer] Sit to Stand: Total assist;+2 physical assistance   Squat pivot transfers: Total assist;+2 physical assistance     General transfer comment: Pt in chair on arrival with transfer by nurse techs at 0830am grossly. Pt at time of eval unable to lean forward, shift weight or stand. With max assist able to reciprocally scoot with pad and cues. Attempted to stand from chair with use of pad and unable with one person assist. With 2 person assist use of pad to control and elevate sacrum with bil knees blocked assisted pt into partial stand with pivot to chair. Pt not bearing weight on either leg and very limited assist with transfer. Nursing staff reports that with getting up she was 2 person assist to stand and pivot to [facility] and chair. Significant change from am. RN notified.   Ambulation/Gait                Stairs            Wheelchair Mobility    Modified Rankin (Stroke Patients Only)       Balance Overall balance assessment: Needs assistance   Sitting balance-Lisa [name] Scale: Poor       Standing balance-Lisa [name] Scale: Zero                               Pertinent Vitals/Pain Pain Assessment: 0-10 Pain Score: 9  Pain Location: pt  with intense back pain with all movement Pain Intervention(s): Repositioned;Patient requesting pain meds-RN notified    Home Living Family/patient expects to be discharged to:: Private residence Living Arrangements: Spouse/significant other Available Help at Discharge: Family;Available PRN/intermittently Type of Home: House       Home Layout: One level Home Equipment: [place] - 2 wheels;Cane - single point      Prior Function Level of Independence: Independent         Comments: brother providing hx stating pt varies between use of DME and nothing     Hand Dominance         Extremity/Trunk Assessment   Upper Extremity Assessment: Generalized weakness           Lower Extremity Assessment: RLE deficits/detail;LLE deficits/detail RLE Deficits / Details: pt with 2+/5 hip flexion, knee flexion and extension within pt available participation. With standing immediate knee buckling with internal rotation LLE Deficits / Details: grossly 2+/5 pt not following all commands. able to extend knee and hold partially against gravity, unable to resist any movement. Pt reports bil numbness      Communication   Communication: Other (comment) (difficult to assess due to cognition)  Cognition Arousal/Alertness: Awake/alert Behavior During Therapy: Flat affect Overall Cognitive Status: Impaired/Different from baseline Area of Impairment: Orientation;Attention;Memory;Following commands;Safety/judgement;Awareness;Problem solving Orientation Level: Disoriented to;Time;Place Current Attention Level: Focused Memory: Decreased short-term memory Following Commands: Follows one step commands inconsistently;Follows one step commands with increased time Safety/Judgement: Decreased awareness of safety;Decreased awareness of deficits   Problem Solving: Slow processing;Decreased initiation;Difficulty sequencing;Requires verbal cues;Requires tactile cues      General Comments      Exercises        Assessment/Plan    PT Assessment Patient needs continued PT services  PT Diagnosis Difficulty walking;Generalized weakness;Altered mental status   PT Problem List Decreased strength;Decreased cognition;Decreased range of motion;Decreased activity tolerance;Decreased balance;Decreased mobility;Pain;Decreased safety awareness  PT Treatment Interventions Gait training;Functional mobility training;Therapeutic activities;Therapeutic exercise;Patient/family education;Cognitive remediation;Neuromuscular re-education;Balance training;DME instruction   PT Goals (Current goals can be found in  the Care Plan section) Acute Rehab PT Goals Patient Stated Goal: pt unable but brother agreeable to return to walking and PLOF PT Goal Formulation: With family Time For Goal Achievement: 04/23/14 Potential to Achieve Goals: Fair    Frequency Min 3X/week   Barriers to discharge Decreased caregiver support      Co-evaluation               End of Session   Activity Tolerance: Patient limited by pain Patient left: in bed;with call bell/phone within reach;with family/visitor present Nurse Communication: Mobility status;Precautions         Time: 7124-5809 PT Time Calculation (min): 27 min   Charges:   PT Evaluation $Initial PT Evaluation Tier I: 1 Procedure PT Treatments $Therapeutic Activity: 8-22 mins   PT G Codes:          Melford Aase 04/09/2014, 12:58 PM Elwyn Reach, [place]

## 2014-04-09 NOTE — Progress Notes (Signed)
MD Maryland Pink notified that patient LLE and LUE has become weaker. Also reported patient's vital signs and pupil assessment. Orders received for a stat head CT.

## 2014-04-09 NOTE — Care Management Note (Signed)
    Page 1 of 1   04/15/2014     11:02:29 AM CARE MANAGEMENT NOTE 04/15/2014  Patient:  Lisa [name], Lisa [name]   Account Number:  [id number]  Date Initiated:  04/09/2014  Documentation initiated by:  Tomi Bamberger  Subjective/Objective Assessment:   dx acute pyelo  admit- lives with spouse.     Action/Plan:   pt eval-rec snf   Anticipated DC Date:  04/15/2014   Anticipated DC Plan:  SKILLED NURSING FACILITY  In-house referral  Clinical Social Worker      DC Planning Services  CM consult      Choice offered to / List presented to:             Status of service:  Completed, signed off Medicare Important Message given?  YES (If response is "NO", the following Medicare IM given date fields will be blank) Date Medicare IM given:  04/09/2014 Medicare IM given by:  Tomi Bamberger Date Additional Medicare IM given:  04/13/2014 Additional Medicare IM given by:  Tomi Bamberger  Discharge Disposition:  [facility] [place]  Per UR Regulation:  Reviewed for med. necessity/level of care/duration of stay  If discussed at [place] of Stay Meetings, dates discussed:    Comments:  04/15/14 [place], BSN [phone] patient for dc to [facility] snf today, CSW following.  04/09/14 [place], BSN [phone] patient lives with spouse, has pyelo, await pt eval.

## 2014-04-09 NOTE — Consult Note (Signed)
[facility] for Infectious Disease  Total days of antibiotics 3        Day 3 ceftriaxone               Reason for Consult: Viridans Strep bacteremia    Referring Physician: Annita Brod, MD.  Principal Problem:   Pyelonephritis, acute Active Problems:   Hypertension   COPD (chronic obstructive pulmonary disease)   Fever   UTI (lower urinary tract infection)   ARF (acute renal failure)   Leukocytosis   Dehydration   Bacteremia   HPI: Lisa [name] is a 79 y.o. female with PMH of HTN, COPD, admitted 04/06/14, presented with dysuria, Rt flank and suprapubic pain, and worsening fever, temp on 100.9 in the Ed.   While on admission, UA was suggestive of UTI, WBC- 14.2, pt was found to have on AKI with CR- 1.54 from  Baseline ~1. Renal US showed- trace perinephric fluid. Pt was started on IV rocephin. Pt has been spiked fevers 24 hrs after starting antibiotics to 100.7. Blood cultures drawn X2- Strep Viridans. Urine cultures without growth. Brother who lives with pt was initally at bedside. Pt says he has not seen a dentist recently, but has right sided tooth pain without swelling, say she needs to see a dentist.  Past Medical History  Diagnosis Date  . COPD (chronic obstructive pulmonary disease)   . Hypertension   . Pyelonephritis 04/06/2014    UTI    Allergies: No Known Allergies  Current antibiotics: IV ceftriazone- 2g daily.  MEDICATIONS: . cefTRIAXone (ROCEPHIN)  IV  2 g Intravenous Q24H  . diazepam  5 mg Oral TID  . diltiazem  120 mg Oral Daily  . enoxaparin (LOVENOX) injection  40 mg Subcutaneous Q24H  . ipratropium-albuterol  3 mL Inhalation QID  . labetalol  300 mg Oral BID    History  Substance Use Topics  . Smoking status: Current Every Day Smoker -- 0.30 packs/day    Types: Cigarettes  . Smokeless tobacco: Never Used  . Alcohol Use: No    History reviewed. No pertinent family history.  Review of Systems  RESPIRATORY- No Cough or  SOB. Back- complaints of back pain CARDIAC- No Palpitations, DOE, PND or chest pain. GI- No nausea, vomiting, diarrhoea, constipation, abd pain. NEUROLOGIC- brother present in room, pt has hx of occasional weakness of lowere extremities, cause not identified  OBJECTIVE: Temp:  [97.6 F (36.4 C)-98.7 F (37.1 C)] 98.2 F (36.8 C) (10/29 0543) Pulse Rate:  [64-71] 71 (10/29 0543) Resp:  [16] 16 (10/29 0543) BP: (147-196)/(48-68) 196/68 mmHg (10/29 0904) SpO2:  [96 %-100 %] 97 % (10/29 0856) Weight:  [131 lb 2.8 oz (59.5 kg)] 131 lb 2.8 oz (59.5 kg) (10/29 0500)  GENERAL- Appears confused, but answers some questions approparitely, awake. HEENT- Atraumatic, normocephalic, PERRL- bilat equally constricted, EOMI, oral mucosa appears mildly dry. CARDIAC- No murmurs appreciated. RESP- Moving equal volumes of air, and clear to auscultation bilaterally, no wheezes or crackles. ABDOMEN- Soft, full, nontender, no guarding or rebound, no palpable masses or organomegaly, bowel sounds present. BACK- Normal curvature of the spine, No tenderness along the vertebrae, has bilat CVA tenderness. NEURO-oriented to person, and place, could not tell month or year, No obvious Cr N abnormality, moves all extremities voluntarily, generalized weakness. EXTREMITIES- pulse 2+, symmetric, no pedal edema, no splinter hemorrhages, on hands, toe nails dystrophic, diffuse hyperpig lesions on palms and soles, unsure of chronicity. SKIN- Warm, dry, desquamation- lower extremities,  No rash or lesion. PSYCH- Normal mood and affect, appropriate thought content and speech.  LABS: Results for orders placed during the hospital encounter of 04/06/14 (from the past 48 hour(s))  BASIC METABOLIC PANEL     Status: Abnormal   Collection Time    04/08/14  6:12 AM      Result Value Ref Range   Sodium 141  137 - 147 mEq/L   Potassium 4.5  3.7 - 5.3 mEq/L   Chloride 109  96 - 112 mEq/L   CO2 20  19 - 32 mEq/L   Glucose, Bld 104  (*) 70 - 99 mg/dL   BUN 38 (*) 6 - 23 mg/dL   Creatinine, Ser 1.10  0.50 - 1.10 mg/dL   Calcium 8.6  8.4 - 10.5 mg/dL   GFR calc non Af Amer 47 (*) >90 mL/min   GFR calc Af Amer 55 (*) >90 mL/min   Comment: (NOTE)     The eGFR has been calculated using the CKD EPI equation.     This calculation has not been validated in all clinical situations.     eGFR's persistently <90 mL/min signify possible Chronic Kidney     Disease.   Anion gap 12  5 - 15  CBC WITH DIFFERENTIAL     Status: Abnormal   Collection Time    04/08/14  6:12 AM      Result Value Ref Range   WBC 11.0 (*) 4.0 - 10.5 K/uL   RBC 3.72 (*) 3.87 - 5.11 MIL/uL   Hemoglobin 11.1 (*) 12.0 - 15.0 g/dL   HCT 33.5 (*) 36.0 - 46.0 %   MCV 90.1  78.0 - 100.0 fL   MCH 29.8  26.0 - 34.0 pg   MCHC 33.1  30.0 - 36.0 g/dL   RDW 13.6  11.5 - 15.5 %   Platelets 183  150 - 400 K/uL   Neutrophils Relative % 77  43 - 77 %   Lymphocytes Relative 13  12 - 46 %   Monocytes Relative 10  3 - 12 %   Eosinophils Relative 0  0 - 5 %   Basophils Relative 0  0 - 1 %   Neutro Abs 8.5 (*) 1.7 - 7.7 K/uL   Lymphs Abs 1.4  0.7 - 4.0 K/uL   Monocytes Absolute 1.1 (*) 0.1 - 1.0 K/uL   Eosinophils Absolute 0.0  0.0 - 0.7 K/uL   Basophils Absolute 0.0  0.0 - 0.1 K/uL   Smear Review MORPHOLOGY UNREMARKABLE    BASIC METABOLIC PANEL     Status: Abnormal   Collection Time    04/09/14  5:30 AM      Result Value Ref Range   Sodium 144  137 - 147 mEq/L   Potassium 5.5 (*) 3.7 - 5.3 mEq/L   Comment: NO VISIBLE HEMOLYSIS   Chloride 112  96 - 112 mEq/L   CO2 18 (*) 19 - 32 mEq/L   Glucose, Bld 92  70 - 99 mg/dL   BUN 27 (*) 6 - 23 mg/dL   Creatinine, Ser 0.98  0.50 - 1.10 mg/dL   Calcium 8.9  8.4 - 10.5 mg/dL   GFR calc non Af Amer 54 (*) >90 mL/min   GFR calc Af Amer 63 (*) >90 mL/min   Comment: (NOTE)     The eGFR has been calculated using the CKD EPI equation.     This calculation has not been validated in all clinical situations.  eGFR's  persistently <90 mL/min signify possible Chronic Kidney     Disease.   Anion gap 14  5 - 15  CBC     Status: Abnormal   Collection Time    04/09/14  5:30 AM      Result Value Ref Range   WBC 11.2 (*) 4.0 - 10.5 K/uL   RBC 3.68 (*) 3.87 - 5.11 MIL/uL   Hemoglobin 10.8 (*) 12.0 - 15.0 g/dL   HCT 33.1 (*) 36.0 - 46.0 %   MCV 89.9  78.0 - 100.0 fL   MCH 29.3  26.0 - 34.0 pg   MCHC 32.6  30.0 - 36.0 g/dL   RDW 13.8  11.5 - 15.5 %   Platelets 196  150 - 400 K/uL    MICRO:  10/26- Urine Cultures- No growth 10/26- Blood cultures X2- Strep Viridans-   IMAGING: No results found.  HISTORICAL MICRO/IMAGING- Renal US- 04/07/2014  No hydronephrosis or focal renal lesion.  Trace perinephric fluid can be incidental, seen with medical renal  disease, or seen with ascending infection. Correlate with creatinine  and urinalysis.  Cholelithiasis. Correlate with right upper quadrant ultrasound if  concerned for acute biliary pathology.  Trace ascites.  Assessment/Plan:  79yo F with flank pain, dysuria, and fever found to have pyelonephritis and viridans group streptococcal bacteremia. Somewhat atypical pathogen for pyelonephritis. She denies any dental procedures or gastro intestinal infections.   Strep Viridans Bacteremia- Pt has been on Ceftriazone for 3 days, fever spiked 24hrs after starting ceftriaxone. Concern for seeding into tissues, endocarditis. At this time pt has not met criteria for endocarditis.  - Repeat blood cultures 10/29 pending. If positive, would consider TEE to look for endocarditis. - Continue 2gm IV ceftriazone for now, Strep Viridans sensitive. - Transthoracic Echo- ordered. - Pt has chronic back pain, would have low threshold for imaging of her spine to look for abscess, especially if still spiking fevers and persistent bacteremia.  Pyelonephritis- On IV ceftriazone. - If persistent fevers, consider repeat renal Ultrasound/Ct to rule out Abcess.  ? Delirium- per  primary.  AKI- Per primary.  Bing Neighbors, MD. PGY-2, IMTS 1:04 PM 04/09/2014.  ------------------------------------- I have personally examined and reviewed lab results. I have discussed assessment and plan as described by Dr. Denton Brick.  Elzie Rings [facility] [phone]

## 2014-04-09 NOTE — Consult Note (Signed)
NEURO HOSPITALIST CONSULT NOTE    Reason for Consult: AMS  HPI:                                                                                                                                          Lisa [name] is an 79 y.o. female initially presenting to hospital due to right flank pain and worsening fever. UA was positive for UTI and patient was placed on rocephin. Urine cultures were negative however blood culture 2 noting gram-positive for Viridans strepococcus. This AM patient was noted to have worsening of mental status and questionable non reactive pupil per nursing staff.  On consultation she is slow to respond but able to answer questions and knows she is in the hospital, year is 2015 and follow commands.  Her main complaint is that she is in "pain all over".   Magnesium normal AST/ALT normal WBC improved but still 11.2   Past Medical History  Diagnosis Date  . COPD (chronic obstructive pulmonary disease)   . Hypertension   . Pyelonephritis 04/06/2014    UTI    Past Surgical History  Procedure Laterality Date  . Abdominal hysterectomy      History reviewed. No pertinent family history.   Social History:  reports that she has been smoking Cigarettes.  She has been smoking about 0.30 packs per day. She has never used smokeless tobacco. She reports that she does not drink alcohol or use illicit drugs.  No Known Allergies  MEDICATIONS:                                                                                                                     Prior to Admission:  Prescriptions prior to admission  Medication Sig Dispense Refill  . albuterol-ipratropium (COMBIVENT) 18-103 MCG/ACT inhaler Inhale 2 puffs into the lungs 4 (four) times daily.      . B Complex Vitamins (VITAMIN-B COMPLEX PO) Take 1 tablet by mouth daily.      Marland Kitchen CALCIUM PO Take 1 tablet by mouth daily.      Marland Kitchen diltiazem (CARDIZEM CD) 120 MG 24 hr capsule Take 120 mg by mouth  daily.        . furosemide (LASIX) 40 MG tablet Take 40 mg by mouth daily  as needed for fluid.       Marland Kitchen ipratropium-albuterol (DUONEB) 0.5-2.5 (3) MG/3ML SOLN Take 3 mLs by nebulization every 6 (six) hours as needed. For shortness of breath      . labetalol (NORMODYNE) 300 MG tablet Take 300 mg by mouth 2 (two) times daily.        Marland Kitchen losartan-hydrochlorothiazide (HYZAAR) 100-25 MG per tablet Take 1 tablet by mouth daily.        . Multiple Vitamins-Minerals (CENTRUM ADULTS PO) Take 1 tablet by mouth daily.      . traMADol (ULTRAM) 50 MG tablet Take 50 mg by mouth every 6 (six) hours as needed for moderate pain.       Scheduled: . cefTRIAXone (ROCEPHIN)  IV  2 g Intravenous Q24H  . diazepam  5 mg Oral TID  . diltiazem  120 mg Oral Daily  . enoxaparin (LOVENOX) injection  40 mg Subcutaneous Q24H  . ipratropium-albuterol  3 mL Inhalation QID  . labetalol  300 mg Oral BID     ROS:                                                                                                                                       History obtained from the patient  General ROS: negative for - chills, fatigue, fever, night sweats, weight gain or weight loss Psychological ROS: negative for - behavioral disorder, hallucinations, memory difficulties, mood swings or suicidal ideation Ophthalmic ROS: negative for - blurry vision, double vision, eye pain or loss of vision ENT ROS: negative for - epistaxis, nasal discharge, oral lesions, sore throat, tinnitus or vertigo Allergy and Immunology ROS: negative for - hives or itchy/watery eyes Hematological and Lymphatic ROS: negative for - bleeding problems, bruising or swollen lymph nodes Endocrine ROS: negative for - galactorrhea, hair pattern changes, polydipsia/polyuria or temperature intolerance Respiratory ROS: negative for - cough, hemoptysis, shortness of breath or wheezing Cardiovascular ROS: negative for - chest pain, dyspnea on exertion, edema or irregular  heartbeat Gastrointestinal ROS: negative for - abdominal pain, diarrhea, hematemesis, nausea/vomiting or stool incontinence Genito-Urinary ROS: negative for - dysuria, hematuria, incontinence or urinary frequency/urgency Musculoskeletal ROS: negative for - joint swelling or muscular weakness Neurological ROS: as noted in HPI Dermatological ROS: negative for rash and skin lesion changes   Blood pressure 153/54, pulse 72, temperature 98 F (36.7 C), temperature source Oral, resp. rate 30, height 5\' 7"  (1.702 m), weight 59.5 kg (131 lb 2.8 oz), SpO2 98.00%.   Neurologic Examination:  General: NAD Mental Status: Alert, oriented, thought content appropriate.  Speech fluent without evidence of aphasia.  Able to follow simple commands without difficulty. Cranial Nerves: II: Discs flat bilaterally; Visual fields grossly normal, pupils equal, round, reactive to light and accommodation III,IV, VI: ptosis not present, extra-ocular motions intact bilaterally V,VII: smile symmetric, facial light touch sensation normal bilaterally VIII: hearing normal bilaterally IX,X: gag reflex present XI: bilateral shoulder shrug XII: midline tongue extension without atrophy or fasciculations  Motor: 4/5 throughout bilaterally in UE and LE.  Moving extremities to both command and spontaneously. She does seem slightly slower when moving her left arm bu has equal strength.  Sensory: Pinprick and light touch intact throughout, bilaterally Deep Tendon Reflexes:  Right: Upper Extremity   Left: Upper extremity   biceps (C-5 to C-6) 2/4   biceps (C-5 to C-6) 2/4 tricep (C7) 2/4    triceps (C7) 2/4 Brachioradialis (C6) 2/4  Brachioradialis (C6) 2/4  Lower Extremity Lower Extremity  quadriceps (L-2 to L-4) 1/4   quadriceps (L-2 to L-4) 1/4 Achilles (S1) 0/4   Achilles (S1) 0/4  Plantars: Mute  bilaterally Cerebellar: normal finger-to-nose Gait: not tested due to fall risk CV: pulses palpable throughout    Lab Results: Basic Metabolic Panel:  Recent Labs Lab 04/06/14 1530 04/07/14 0850 04/08/14 0612 04/09/14 0530  NA 137 141 141 144  K 4.3 3.4* 4.5 5.5*  CL 97 102 109 112  CO2 23 24 20  18*  GLUCOSE 107* 96 104* 92  BUN 54* 53* 38* 27*  CREATININE 1.54* 1.48* 1.10 0.98  CALCIUM 10.1 8.9 8.6 8.9  MG  --  2.2  --   --     Liver Function Tests:  Recent Labs Lab 04/06/14 1530  AST 29  ALT 20  ALKPHOS 78  BILITOT 0.6  PROT 8.5*  ALBUMIN 2.9*   No results found for this basename: LIPASE, AMYLASE,  in the last 168 hours No results found for this basename: AMMONIA,  in the last 168 hours  CBC:  Recent Labs Lab 04/06/14 1530 04/08/14 0612 04/09/14 0530  WBC 14.2* 11.0* 11.2*  NEUTROABS 11.6* 8.5*  --   HGB 13.3 11.1* 10.8*  HCT 39.5 33.5* 33.1*  MCV 89.4 90.1 89.9  PLT 183 183 196    Cardiac Enzymes: No results found for this basename: CKTOTAL, CKMB, CKMBINDEX, TROPONINI,  in the last 168 hours  Lipid Panel: No results found for this basename: CHOL, TRIG, HDL, CHOLHDL, VLDL, LDLCALC,  in the last 168 hours  CBG: No results found for this basename: GLUCAP,  in the last 168 hours  Microbiology: Results for orders placed during the hospital encounter of 04/06/14  CULTURE, BLOOD (ROUTINE X 2)     Status: None   Collection Time    04/06/14  5:55 PM      Result Value Ref Range Status   Specimen Description BLOOD RIGHT FOREARM   Final   Special Requests BOTTLES DRAWN AEROBIC ONLY 2CC   Final   Culture  Setup Time     Final   Value: 04/07/2014 01:06     Performed at [facility]   Culture     Final   Value: VIRIDANS STREPTOCOCCUS     Note: Gram Stain Report Called to,Read Back By and Verified With: Amaryllis Dyke RN 900P     Performed at [facility]   Report Status 04/09/2014 FINAL   Final   Organism ID, Bacteria VIRIDANS  STREPTOCOCCUS   Final  CULTURE, BLOOD (ROUTINE X 2)     Status: None   Collection Time    04/06/14  6:02 PM      Result Value Ref Range Status   Specimen Description BLOOD RIGHT ANTECUBITAL   Final   Special Requests BOTTLES DRAWN AEROBIC AND ANAEROBIC 5CC   Final   Culture  Setup Time     Final   Value: 04/07/2014 01:06     Performed at [facility]   Culture     Final   Value: VIRIDANS STREPTOCOCCUS     Note: SUSCEPTIBILITIES PERFORMED ON PREVIOUS CULTURE WITHIN THE LAST 5 DAYS.     Note: Gram Stain Report Called to,Read Back By and Verified With: Joslyn Hy RN [phone]     Performed at [facility]   Report Status 04/09/2014 FINAL   Final  URINE CULTURE     Status: None   Collection Time    04/06/14  6:20 PM      Result Value Ref Range Status   Specimen Description URINE, CATHETERIZED   Final   Special Requests NONE   Final   Culture  Setup Time     Final   Value: 04/06/2014 18:55     Performed at [facility]     Final   Value: NO GROWTH     Performed at [facility]   Culture     Final   Value: NO GROWTH     Performed at [facility]   Report Status 04/07/2014 FINAL   Final    Coagulation Studies:  Recent Labs  04/06/14 1530  LABPROT 13.8  INR 1.05    Imaging: No results found.     Assessment and plan per attending neurologist  Etta Quill PA-C [facility] [phone]  04/09/2014, 1:18 PM   Assessment/Plan: 79 YO female with known bacteremia now presenting with decreased mental status. Exam shows no focal weakness and patient is alert and able to follow commands. CT head is pending.  At this time exam is more consistent with toxic encephalopathy.  That said, infectious disease is concerned for possible seeding of infection and possible endocarditis.   Recommend: 1) CT head. 2) Ammonia, TSH 3) Continue treating underlying infections and agree with TEE to evaluate for endocarditis if  repeat BC are positive.  4) If no improvement with continued ABX treatment would consider MRI brain to evaluate for infectious shower of emboli.     Jim Like, DO [facility] [phone]  If 7pm- 7am, please page neurology on call as listed in [place].

## 2014-04-10 ENCOUNTER — Encounter (HOSPITAL_COMMUNITY)
Admission: EM | Disposition: A | Discharge status: Skilled Nursing Facility | Payer: Self-pay | Source: Home / Self Care | Attending: Internal Medicine

## 2014-04-10 ENCOUNTER — Encounter (HOSPITAL_COMMUNITY): Payer: Self-pay

## 2014-04-10 ENCOUNTER — Encounter (HOSPITAL_COMMUNITY): Payer: Self-pay | Admitting: Gastroenterology

## 2014-04-10 DIAGNOSIS — E440 Moderate protein-calorie malnutrition: Secondary | ICD-10-CM | POA: Diagnosis present

## 2014-04-10 DIAGNOSIS — Z8639 Personal history of other endocrine, nutritional and metabolic disease: Secondary | ICD-10-CM

## 2014-04-10 DIAGNOSIS — G9340 Encephalopathy, unspecified: Secondary | ICD-10-CM

## 2014-04-10 DIAGNOSIS — E860 Dehydration: Secondary | ICD-10-CM

## 2014-04-10 DIAGNOSIS — B955 Unspecified streptococcus as the cause of diseases classified elsewhere: Secondary | ICD-10-CM

## 2014-04-10 DIAGNOSIS — I341 Nonrheumatic mitral (valve) prolapse: Secondary | ICD-10-CM

## 2014-04-10 DIAGNOSIS — N12 Tubulo-interstitial nephritis, not specified as acute or chronic: Secondary | ICD-10-CM

## 2014-04-10 HISTORY — PX: TEE WITHOUT CARDIOVERSION: SHX5443

## 2014-04-10 SURGERY — ECHOCARDIOGRAM, TRANSESOPHAGEAL
Anesthesia: Moderate Sedation

## 2014-04-10 MED ORDER — BUTAMBEN-TETRACAINE-BENZOCAINE 2-2-14 % EX AERO
INHALATION_SPRAY | CUTANEOUS | Status: DC | PRN
  Administered 2014-04-10: 1 via TOPICAL

## 2014-04-10 MED ORDER — FENTANYL CITRATE 0.05 MG/ML IJ SOLN
INTRAMUSCULAR | Status: AC
  Filled 2014-04-10: qty 2

## 2014-04-10 MED ORDER — SODIUM CHLORIDE 0.9 % IV SOLN
INTRAVENOUS | Status: DC
  Administered 2014-04-10 (×2): via INTRAVENOUS

## 2014-04-10 MED ORDER — MIDAZOLAM HCL 5 MG/ML IJ SOLN
INTRAMUSCULAR | Status: AC
  Filled 2014-04-10: qty 2

## 2014-04-10 MED ORDER — HYDRALAZINE HCL 20 MG/ML IJ SOLN
10.0000 mg | Freq: Once | INTRAMUSCULAR | Status: AC
  Administered 2014-04-10: 10 mg via INTRAVENOUS
  Filled 2014-04-10: qty 1

## 2014-04-10 MED ORDER — MIDAZOLAM HCL 10 MG/2ML IJ SOLN
INTRAMUSCULAR | Status: DC | PRN
  Administered 2014-04-10 (×2): 1 mg via INTRAVENOUS
  Administered 2014-04-10: 2 mg via INTRAVENOUS

## 2014-04-10 MED ORDER — FENTANYL CITRATE 0.05 MG/ML IJ SOLN
INTRAMUSCULAR | Status: DC | PRN
  Administered 2014-04-10 (×2): 25 ug via INTRAVENOUS

## 2014-04-10 MED ORDER — FUROSEMIDE 10 MG/ML IJ SOLN
20.0000 mg | Freq: Two times a day (BID) | INTRAMUSCULAR | Status: DC
  Administered 2014-04-10 – 2014-04-13 (×7): 20 mg via INTRAVENOUS
  Filled 2014-04-10 (×9): qty 2

## 2014-04-10 NOTE — H&P (View-Only) (Signed)
PROGRESS NOTE  Ceceilia Cephus ZHG:[id number] DOB: May 30, 1935 DOA: 04/06/2014 PCP: [name], MD  HPI/Recap of past 62 hours: 79 year old female admitted 10/26 with fever and back pain and perinephric stranding suspicious for pyelonephritis seen on CT. Patient started on IV Rocephin initially. Patient white count improving although still having low-grade temperature. Urine cultures with no growth, but blood cultures times multiple sets grew out gram-positive cocci in chains which by 10/29 positive for Streptococcus viridans. Discussed with infectious disease who increased dose of Rocephin to 2 g daily. Cardiology consult for TEE.  By mid day, physical therapy concern that patient seemed to have decreased strength and sensation on her left upper extremity. Nursing also reported some confusion. CT scan of the head done was unrevealing. Neurology consult at felt the patient's picture perhaps resembled more likely encephalopathy.  Patient herself complains of generalized pain  Assessment/plan Principal problem:   Pyelonephritis, acute: Still suspect it is primary cause. . Renal ultrasound notes no evidence of hydronephrosis, on Rocephin Active Problems:   Hypertension: Initially lower yesterday, so IV fluids increased. Today blood pressures have been as high as the 190s. Have decreased IV fluids and checking BNP   COPD (chronic obstructive pulmonary disease)   Fever: Likely from strep viridans. Rocephin dose increased   UTI (lower urinary tract infection): No growth from urine cultures. Awaiting blood culture.   ARF (acute renal failure): Resolved secondary to UTI and dehydration, improved with IV fluids   Leukocytosis: Improved with antibiotics.    Dehydration: Improved with IV fluids.    Bacteremia: As above   Code Status: Full code  Family Communication: Spoke with patient's son at the bedside  Disposition Plan: Awaiting further workup including TEE and antibiotic  choices   Consultants:  Infectious disease  Neurology  Cardiology  Procedures:  None  Antibiotics:  IV Rocephin 10/27-with dose increased 10/29-present  IV Zosyn 10/26-10/27  IV vancomycin 10/28-10/29   Objective: BP 164/46  Pulse 72  Temp(Src) 98 F (36.7 C) (Oral)  Resp 25  Ht 5\' 7"  (1.702 m)  Wt 59.5 kg (131 lb 2.8 oz)  BMI 20.54 kg/m2  SpO2 98%  Intake/Output Summary (Last 24 hours) at 04/09/14 1747 Last data filed at 04/09/14 1015  Gross per 24 hour  Intake   2395 ml  Output      0 ml  Net   2395 ml   Filed Weights   04/07/14 1232 04/08/14 0534 04/09/14 0500  Weight: 54.795 kg (120 lb 12.8 oz) 56.4 kg (124 lb 5.4 oz) 59.5 kg (131 lb 2.8 oz)    Exam:   General:  Alert and oriented 2, fatigued  Cardiovascular: Regular rate and rhythm, S1-S2  Respiratory: Clear to auscultation bilaterally  Abdomen: Soft, nontender, nondistended, positive bowel sounds, mild left flank pain  Musculoskeletal: No clubbing or cyanosis or edema   Neuro: Nonfocal  Data Reviewed: Basic Metabolic Panel:  Recent Labs Lab 04/06/14 1530 04/07/14 0850 04/08/14 0612 04/09/14 0530  NA 137 141 141 144  K 4.3 3.4* 4.5 5.5*  CL 97 102 109 112  CO2 23 24 20  18*  GLUCOSE 107* 96 104* 92  BUN 54* 53* 38* 27*  CREATININE 1.54* 1.48* 1.10 0.98  CALCIUM 10.1 8.9 8.6 8.9  MG  --  2.2  --   --    Liver Function Tests:  Recent Labs Lab 04/06/14 1530  AST 29  ALT 20  ALKPHOS 78  BILITOT 0.6  PROT 8.5*  ALBUMIN 2.9*  No results found for this basename: LIPASE, AMYLASE,  in the last 168 hours  Recent Labs Lab 04/09/14 1505  AMMONIA 30   CBC:  Recent Labs Lab 04/06/14 1530 04/08/14 0612 04/09/14 0530  WBC 14.2* 11.0* 11.2*  NEUTROABS 11.6* 8.5*  --   HGB 13.3 11.1* 10.8*  HCT 39.5 33.5* 33.1*  MCV 89.4 90.1 89.9  PLT 183 183 196   Cardiac Enzymes:   No results found for this basename: CKTOTAL, CKMB, CKMBINDEX, TROPONINI,  in the last 168  hours BNP (last 3 results) No results found for this basename: PROBNP,  in the last 8760 hours CBG: No results found for this basename: GLUCAP,  in the last 168 hours  Recent Results (from the past 240 hour(s))  CULTURE, BLOOD (ROUTINE X 2)     Status: None   Collection Time    04/06/14  5:55 PM      Result Value Ref Range Status   Specimen Description BLOOD RIGHT FOREARM   Final   Special Requests BOTTLES DRAWN AEROBIC ONLY 2CC   Final   Culture  Setup Time     Final   Value: 04/07/2014 01:06     Performed at [facility]   Culture     Final   Value: VIRIDANS STREPTOCOCCUS     Note: Gram Stain Report Called to,Read Back By and Verified With: Amaryllis Dyke RN 900P     Performed at [facility]   Report Status 04/09/2014 FINAL   Final   Organism ID, Bacteria VIRIDANS STREPTOCOCCUS   Final  CULTURE, BLOOD (ROUTINE X 2)     Status: None   Collection Time    04/06/14  6:02 PM      Result Value Ref Range Status   Specimen Description BLOOD RIGHT ANTECUBITAL   Final   Special Requests BOTTLES DRAWN AEROBIC AND ANAEROBIC 5CC   Final   Culture  Setup Time     Final   Value: 04/07/2014 01:06     Performed at [facility]   Culture     Final   Value: VIRIDANS STREPTOCOCCUS     Note: SUSCEPTIBILITIES PERFORMED ON PREVIOUS CULTURE WITHIN THE LAST 5 DAYS.     Note: Gram Stain Report Called to,Read Back By and Verified With: Joslyn Hy RN [phone]     Performed at [facility]   Report Status 04/09/2014 FINAL   Final  URINE CULTURE     Status: None   Collection Time    04/06/14  6:20 PM      Result Value Ref Range Status   Specimen Description URINE, CATHETERIZED   Final   Special Requests NONE   Final   Culture  Setup Time     Final   Value: 04/06/2014 18:55     Performed at [place]     Final   Value: NO GROWTH     Performed at [facility]   Culture     Final   Value: NO GROWTH     Performed at [facility]   Report Status 04/07/2014 FINAL   Final     Studies: No results found.  Scheduled Meds: . cefTRIAXone (ROCEPHIN)  IV  2 g Intravenous Q24H  . diazepam  5 mg Oral TID  . diltiazem  120 mg Oral Daily  . enoxaparin (LOVENOX) injection  40 mg Subcutaneous Q24H  . ipratropium-albuterol  3 mL Inhalation QID  .  labetalol  300 mg Oral BID    Continuous Infusions: . sodium chloride 125 mL/hr at 04/09/14 0908     Time spent: 35 minutes  [facility] Pager [phone]. If 7PM-7AM, please contact night-coverage at [email or web address], password [facility] 04/09/2014, 5:47 PM  LOS: 3 days

## 2014-04-10 NOTE — Progress Notes (Signed)
[facility] for Infectious Disease    Date of Admission:  04/06/2014   Total days of antibiotics 5        Day 4 ceftriazone                   ID: Lisa [name] is a 79 y.o. female with  PMH of HTN, COPD, chronic back pain, being managed for strep viridans bacteremia and pyelonephritis.   Principal Problem:   Streptococcus viridans infection Active Problems:   Hypertension   COPD (chronic obstructive pulmonary disease)   Fever   UTI (lower urinary tract infection)   ARF (acute renal failure)   Pyelonephritis, acute   Leukocytosis   Dehydration   Acute encephalopathy  Subjective: Doing well this morning. Feels better, says she is having back pain, not changed form her chronic back pain. No fevers overnight. Had CT scan done yesterday, which was negative for hemorragic infarct.  Had TEE done- this morning. No complainst of weakness of her extremities.   Medications:  . cefTRIAXone (ROCEPHIN)  IV  2 g Intravenous Q24H  . diazepam  5 mg Oral TID  . diltiazem  120 mg Oral Daily  . enoxaparin (LOVENOX) injection  40 mg Subcutaneous Q24H  . furosemide  20 mg Intravenous BID  . ipratropium-albuterol  3 mL Inhalation QID  . labetalol  300 mg Oral BID   Objective: Vital signs in last 24 hours: Temp:  [97.9 F (36.6 C)-99.5 F (37.5 C)] 98 F (36.7 C) (10/30 0738) Pulse Rate:  [63-91] 65 (10/30 0945) Resp:  [20-30] 27 (10/30 0852) BP: (148-223)/(46-79) 194/54 mmHg (10/30 0945) SpO2:  [98 %-100 %] 98 % (10/30 0852)   General appearance: alert, cooperative, appears stated age and no distress Head: Normocephalic, without obvious abnormality, atraumatic Neck: thyroid: enlarged and non tender, pt reports that she has had a large thyroid, without dysphagia or dyspnea. Resp: clear to auscultation bilaterally Cardio: regular rate and rhythm, S1, S2 normal, no murmur, click, rub or gallop and . GI: soft, non-tender; bowel sounds normal; no masses,  no  organomegaly Extremities: extremities normal, atraumatic, no cyanosis or edema Neuro- normal strenght, no focal weakness or obvious cranial nerve abnormality.  Lab Results  Recent Labs  04/08/14 0612 04/09/14 0530  WBC 11.0* 11.2*  HGB 11.1* 10.8*  HCT 33.5* 33.1*  NA 141 144  K 4.5 5.5*  CL 109 112  CO2 20 18*  BUN 38* 27*  CREATININE 1.10 0.98   Microbiology: 10/26- Urine Cultures- No growth Final 10/26- Blood cultures X2- Strep Viridans Final 10/29- Blood cultures X2- NGTD >>  Studies/Results: Ct Head Wo Contrast  04/09/2014   CLINICAL DATA:  79 year old hypertensive female with dizziness and arm weakness. Initial encounter.  EXAM: CT HEAD WITHOUT CONTRAST  TECHNIQUE: Contiguous axial images were obtained from the base of the skull through the vertex without intravenous contrast.  COMPARISON:  07/21/2013 MR.  No comparison CT  FINDINGS: Exam is motion degraded.  No intracranial hemorrhage.  Small vessel disease type changes without CT evidence of large acute infarct. If infarct is of high clinical concern (particularly posterior fossa infarct) MR may be considered.  No hydrocephalus  No intracranial mass detected on this unenhanced exam.  Vascular calcifications.  Mild mucosal thickening right maxillary sinus.  Mild exophthalmos.  IMPRESSION: Exam is motion degraded.  No intracranial hemorrhage.  Small vessel disease type changes without CT evidence of large acute infarct. If infarct is of high clinical concern (  particularly posterior fossa infarct) MR may be considered.   Electronically Signed   By: Chauncey Cruel M.D.   On: 04/09/2014 14:27   Assessment/Plan:  79yo F with flank pain, dysuria, and fever found to have pyelonephritis and viridans group streptococcal bacteremia. Somewhat atypical pathogen for pyelonephritis. She denies any dental procedures or gastro intestinal infections.   Strep Viridans Bacteremia- On ceftriazone, no fevers in >48hrs. TEE negative for  endocarditis.  - Repeat blood cultures 10/29 pending- NGTD.  - treat for a total of 2 wks with  2gm IV ceftriazone daily, last dose- 11th of Nov.  Pyelonephritis- On IV ceftriazone 2gm which would treat her infectiosu process  - If persistent fevers, consider repeat renal Ultrasound/Ct to rule out Abcess.   HTN- per primary.  Acute encephalopathy Vs delirium. Likely close to her baseline dementia but maybe exacerbated with poor sleep  [place], [place] Pager- [phone] IMTS, PGY-2 04/10/2014, 10:44 AM  ----------------------------------------- I have personally examined and reviewed results. I agree with the assessment and plan as discussed with Dr. Denton Brick.  Elzie Rings [facility] [phone]

## 2014-04-10 NOTE — Progress Notes (Signed)
INITIAL NUTRITION ASSESSMENT  DOCUMENTATION CODES Per approved criteria  -Non-severe (moderate) malnutrition in the context of chronic illness   Pt meets criteria for moderate MALNUTRITION in the context of chronic illness as evidenced by <75% of estimated energy intake x 1 month, mild fat and muscle depletion.  INTERVENTION: -Once diet is advanced: Ensure Complete po BID, each supplement provides 350 kcal and 13 grams of protein  NUTRITION DIAGNOSIS: Inadequate oral intake related to decreased appetite as evidenced by 25% meal completion, mild fat and muscle depletion.   Goal: Pt will meet >90% of estimated nutritional needs  Monitor:  Diet advancement, PO/supplement intake, labs, weight changes, I/O's  Reason for Assessment: Consult to assess needs  79 y.o. female  Admitting Dx: Streptococcus viridans infection  79 yo female with several days of rt flank pain worsening now with fever. Also with suprapubic pain, does not feel well. No n/v/d. Some dyruria. Not on abx frequently. Has uti/pyelo. Was initially tachycardic in the ED and febrile, this has improved with ivf and pain control.  ASSESSMENT: Hx obtained by pt at bedside. She endorses both poor appetite and weight loss over the past 3 months. However, she is unable to give me an accurate weight hx. She reveals that she has lost 20#, but is unable to give time frame for weight loss. She suspects that she has lost some weight due to poor intake over the past 3 months.  Noted pt has no teeth, however, she denies any issues chewing or swallowing. She reports she eats a regular diet at home. She declines offer of diet downgrade.  Pt is currently NPO until fully awake from TEE. Noted previously on a regular diet, with 25% meal completion.  S/p TEE today. No endocarditis or vegetation found.  Labs reviewed. K: 5.5, Cl: 18, BUN: 27. Mg WDL.   Nutrition Focused Physical Exam:  Subcutaneous Fat:  Orbital Region: mild  depletion Upper Arm Region: WDL Thoracic and Lumbar Region: mild depletion  Muscle:  Temple Region: WDL Clavicle Bone Region: mild depletion Clavicle and Acromion Bone Region: mild depletion Scapular Bone Region: mild depleiton Dorsal Hand: mild depletion Patellar Region: mild depletion Anterior Thigh Region: mild depletion Posterior Calf Region: mild depletion  Edema: none present   Height: Ht Readings from Last 1 Encounters:  04/07/14 5\' 7"  (1.702 m)    Weight: Wt Readings from Last 1 Encounters:  04/09/14 131 lb 2.8 oz (59.5 kg)    Ideal Body Weight: 135#  % Ideal Body Weight: 97%  Wt Readings from Last 10 Encounters:  04/09/14 131 lb 2.8 oz (59.5 kg)  04/09/14 131 lb 2.8 oz (59.5 kg)  08/11/11 135 lb (61.236 kg)  02/12/11 135 lb (61.236 kg)    Usual Body Weight: 135#  % Usual Body Weight: 97%  BMI:  Body mass index is 20.54 kg/(m^2). Normal weight range  Estimated Nutritional Needs: Kcal: 1700-1900 Protein: 71-81 grams daily Fluid: 1.7-1.9 L  Skin: Intact  Diet Order:  NPO  EDUCATION NEEDS: -No education needs identified at this time   Intake/Output Summary (Last 24 hours) at 04/10/14 0918 Last data filed at 04/10/14 0800  Gross per 24 hour  Intake    420 ml  Output      0 ml  Net    420 ml    Last BM: 04/08/14  Labs:   Recent Labs Lab 04/06/14 1530 04/07/14 0850 04/08/14 0612 04/09/14 0530  NA 137 141 141 144  K 4.3 3.4* 4.5 5.5*  CL  97 102 109 112  CO2 23 24 20  18*  BUN 54* 53* 38* 27*  CREATININE 1.54* 1.48* 1.10 0.98  CALCIUM 10.1 8.9 8.6 8.9  MG  --  2.2  --   --   GLUCOSE 107* 96 104* 92    CBG (last 3)  No results found for this basename: GLUCAP,  in the last 72 hours  Scheduled Meds: . cefTRIAXone (ROCEPHIN)  IV  2 g Intravenous Q24H  . diazepam  5 mg Oral TID  . diltiazem  120 mg Oral Daily  . enoxaparin (LOVENOX) injection  40 mg Subcutaneous Q24H  . ipratropium-albuterol  3 mL Inhalation QID  . labetalol   300 mg Oral BID    Continuous Infusions: . sodium chloride 50 mL/hr at 04/09/14 1842  . sodium chloride      Past Medical History  Diagnosis Date  . COPD (chronic obstructive pulmonary disease)   . Hypertension   . Pyelonephritis 04/06/2014    UTI    Past Surgical History  Procedure Laterality Date  . Abdominal hysterectomy      Ola Raap A. Jimmye Norman, RD, LDN Pager: [phone] After hours Pager: [phone]

## 2014-04-10 NOTE — Progress Notes (Signed)
Echocardiogram Echocardiogram Transesophageal has been performed.  Lisa [name] 04/10/2014, 10:10 AM

## 2014-04-10 NOTE — Progress Notes (Addendum)
PROGRESS NOTE  Lisa [name] [id number] DOB: 1935-01-02 DOA: 04/06/2014 PCP: [name], MD  HPI/Recap of past 65 hours: 79 year old female admitted 10/26 with fever and back pain and perinephric stranding suspicious for pyelonephritis seen on CT. Patient started on IV Rocephin initially. Patient white count improving although still having low-grade temperature. Urine cultures with no growth, but blood cultures times multiple sets grew out gram-positive cocci in chains which by 10/29 positive for Streptococcus viridans. Discussed with infectious disease who increased dose of Rocephin to 2 g daily. Cardiology consulted for TEE.  Physical therapy on 10/29 concerned about right extremity weakness,. Nursing also reported some confusion. CT scan of the head done was unrevealing. Neurology consult at felt the patient's picture perhaps resembled more likely encephalopathy.  Patient status post TEE this morning. Patient doing better today. More alert and appropriate. Still has chronic back pain. No fevers overnight  Assessment/plan Principal problem:   Strep viridans bacteremia/Pyelonephritis, acute: Still suspect it is primary cause. . Renal ultrasound notes no evidence of hydronephrosis, on Rocephin Active Problems:   Hypertension: Initially lower yesterday, so IV fluids increased. Today blood pressures have been as high as the 190s. Likely secondary to volume overload so have stopped fluids and started Lasix after BNP elevated.    COPD (chronic obstructive pulmonary disease): Stable during this hospitalization   Fever: Likely from strep viridans. No further fever since Rocephin dose increased.  Acute renal failure): Resolved secondary to UTI and dehydration, improved with IV fluids    Dehydration: Resolved with IV fluids.  Acute on chronic diastolic heart failure: Systolic function preserved by TEE. Diastolic function not commented on. BNP up at close to 6000. Have stopped IV fluids  and started IV Lasix  Moderate protein calorie malnutrition:Pt meets criteria for moderate MALNUTRITION in the context of chronic illness as evidenced by <75% of estimated energy intake x 1 month, mild fat and muscle depletion. Seen by nutrition. Started on Ensure twice a day.  Code Status: Full code  Family Communication: Spoke with patient's son at the bedside  Disposition Plan: Awaiting further workup including TEE and antibiotic choices   Consultants:  Infectious disease  Neurology  Cardiology  Procedures:  TEE done 10/30: No evidence of vegetation, preserved ejection fraction.  Antibiotics:  IV Rocephin 10/27-with dose increased 10/29-present  IV Zosyn 10/26-10/27  IV vancomycin 10/28-10/29   Objective: BP 195/57  Pulse 69  Temp(Src) 99 F (37.2 C) (Axillary)  Resp 22  Ht 5\' 7"  (1.702 m)  Wt 59.5 kg (131 lb 2.8 oz)  BMI 20.54 kg/m2  SpO2 96%  Intake/Output Summary (Last 24 hours) at 04/10/14 1535 Last data filed at 04/10/14 1330  Gross per 24 hour  Intake    740 ml  Output      0 ml  Net    740 ml   Filed Weights   04/07/14 1232 04/08/14 0534 04/09/14 0500  Weight: 54.795 kg (120 lb 12.8 oz) 56.4 kg (124 lb 5.4 oz) 59.5 kg (131 lb 2.8 oz)    Exam:   General:  More alert and oriented  HEENT: Noted goiter. Patient has previous history of, nontender  Cardiovascular: Regular rate and rhythm, S1-S2  Respiratory: Clear to auscultation bilaterally  Abdomen: Soft, nontender, nondistended, positive bowel sounds, mild left flank pain  Musculoskeletal: No clubbing or cyanosis or edema   Neuro: Nonfocal  Data Reviewed: Basic Metabolic Panel:  Recent Labs Lab 04/06/14 1530 04/07/14 0850 04/08/14 0612 04/09/14 0530  NA 137 141 141  144  K 4.3 3.4* 4.5 5.5*  CL 97 102 109 112  CO2 23 24 20  18*  GLUCOSE 107* 96 104* 92  BUN 54* 53* 38* 27*  CREATININE 1.54* 1.48* 1.10 0.98  CALCIUM 10.1 8.9 8.6 8.9  MG  --  2.2  --   --    Liver Function  Tests:  Recent Labs Lab 04/06/14 1530  AST 29  ALT 20  ALKPHOS 78  BILITOT 0.6  PROT 8.5*  ALBUMIN 2.9*   No results found for this basename: LIPASE, AMYLASE,  in the last 168 hours  Recent Labs Lab 04/09/14 1505  AMMONIA 30   CBC:  Recent Labs Lab 04/06/14 1530 04/08/14 0612 04/09/14 0530  WBC 14.2* 11.0* 11.2*  NEUTROABS 11.6* 8.5*  --   HGB 13.3 11.1* 10.8*  HCT 39.5 33.5* 33.1*  MCV 89.4 90.1 89.9  PLT 183 183 196   Cardiac Enzymes:   No results found for this basename: CKTOTAL, CKMB, CKMBINDEX, TROPONINI,  in the last 168 hours BNP (last 3 results)  Recent Labs  04/09/14 1947  PROBNP 5197.0*   CBG: No results found for this basename: GLUCAP,  in the last 168 hours  Recent Results (from the past 240 hour(s))  CULTURE, BLOOD (ROUTINE X 2)     Status: None   Collection Time    04/06/14  5:55 PM      Result Value Ref Range Status   Specimen Description BLOOD RIGHT FOREARM   Final   Special Requests BOTTLES DRAWN AEROBIC ONLY 2CC   Final   Culture  Setup Time     Final   Value: 04/07/2014 01:06     Performed at [facility]   Culture     Final   Value: VIRIDANS STREPTOCOCCUS     Note: Gram Stain Report Called to,Read Back By and Verified With: Amaryllis Dyke RN 900P     Performed at [facility]   Report Status 04/09/2014 FINAL   Final   Organism ID, Bacteria VIRIDANS STREPTOCOCCUS   Final  CULTURE, BLOOD (ROUTINE X 2)     Status: None   Collection Time    04/06/14  6:02 PM      Result Value Ref Range Status   Specimen Description BLOOD RIGHT ANTECUBITAL   Final   Special Requests BOTTLES DRAWN AEROBIC AND ANAEROBIC 5CC   Final   Culture  Setup Time     Final   Value: 04/07/2014 01:06     Performed at [facility]   Culture     Final   Value: VIRIDANS STREPTOCOCCUS     Note: SUSCEPTIBILITIES PERFORMED ON PREVIOUS CULTURE WITHIN THE LAST 5 DAYS.     Note: Gram Stain Report Called to,Read Back By and Verified With: Joslyn Hy RN [phone]     Performed at [facility]   Report Status 04/09/2014 FINAL   Final  URINE CULTURE     Status: None   Collection Time    04/06/14  6:20 PM      Result Value Ref Range Status   Specimen Description URINE, CATHETERIZED   Final   Special Requests NONE   Final   Culture  Setup Time     Final   Value: 04/06/2014 18:55     Performed at [facility]     Final   Value: NO GROWTH     Performed at [facility]  Final   Value: NO GROWTH     Performed at [facility]   Report Status 04/07/2014 FINAL   Final  CULTURE, BLOOD (ROUTINE X 2)     Status: None   Collection Time    04/09/14 12:30 PM      Result Value Ref Range Status   Specimen Description BLOOD LEFT ARM   Final   Special Requests BOTTLES DRAWN AEROBIC ONLY [facility]   Final   Culture  Setup Time     Final   Value: 04/09/2014 17:55     Performed at [facility]   Culture     Final   Value:        BLOOD CULTURE RECEIVED NO GROWTH TO DATE CULTURE WILL BE HELD FOR 5 DAYS BEFORE ISSUING A FINAL NEGATIVE REPORT     Performed at [facility]   Report Status PENDING   Incomplete  CULTURE, BLOOD (ROUTINE X 2)     Status: None   Collection Time    04/09/14 12:50 PM      Result Value Ref Range Status   Specimen Description BLOOD LEFT HAND   Final   Special Requests BOTTLES DRAWN AEROBIC ONLY [place]   Final   Culture  Setup Time     Final   Value: 04/09/2014 17:55     Performed at [facility]   Culture     Final   Value:        BLOOD CULTURE RECEIVED NO GROWTH TO DATE CULTURE WILL BE HELD FOR 5 DAYS BEFORE ISSUING A FINAL NEGATIVE REPORT     Performed at [facility]   Report Status PENDING   Incomplete     Studies: Ct Head Wo Contrast  04/09/2014   CLINICAL DATA:  79 year old hypertensive female with dizziness and arm weakness. Initial encounter.  EXAM: CT HEAD WITHOUT CONTRAST  TECHNIQUE: Contiguous axial images were obtained  from the base of the skull through the vertex without intravenous contrast.  COMPARISON:  07/21/2013 MR.  No comparison CT  FINDINGS: Exam is motion degraded.  No intracranial hemorrhage.  Small vessel disease type changes without CT evidence of large acute infarct. If infarct is of high clinical concern (particularly posterior fossa infarct) MR may be considered.  No hydrocephalus  No intracranial mass detected on this unenhanced exam.  Vascular calcifications.  Mild mucosal thickening right maxillary sinus.  Mild exophthalmos.  IMPRESSION: Exam is motion degraded.  No intracranial hemorrhage.  Small vessel disease type changes without CT evidence of large acute infarct. If infarct is of high clinical concern (particularly posterior fossa infarct) MR may be considered.   Electronically Signed   By: Chauncey Cruel M.D.   On: 04/09/2014 14:27    Scheduled Meds: . cefTRIAXone (ROCEPHIN)  IV  2 g Intravenous Q24H  . diazepam  5 mg Oral TID  . diltiazem  120 mg Oral Daily  . enoxaparin (LOVENOX) injection  40 mg Subcutaneous Q24H  . furosemide  20 mg Intravenous BID  . ipratropium-albuterol  3 mL Inhalation QID  . labetalol  300 mg Oral BID    Continuous Infusions: . sodium chloride 20 mL/hr at 04/10/14 1104     Time spent: 25 minutes  [facility] Pager [phone]. If 7PM-7AM, please contact night-coverage at [email or web address], password [facility] 04/10/2014, 3:35 PM  LOS: 4 days

## 2014-04-10 NOTE — Interval H&P Note (Signed)
History and Physical Interval Note:  04/10/2014 8:18 AM  Lisa [name]  has presented today for surgery, with the diagnosis of R/O VEGETATION  The various methods of treatment have been discussed with the patient and family. After consideration of risks, benefits and other options for treatment, the patient has consented to  Procedure(s): TRANSESOPHAGEAL ECHOCARDIOGRAM (TEE) (N/A) as a surgical intervention .  The patient's history has been reviewed, patient examined, no change in status, stable for surgery.  I have reviewed the patient's chart and labs.  Questions were answered to the patient's satisfaction.     SKAINS, MARK

## 2014-04-10 NOTE — CV Procedure (Signed)
    TEE  Ind: S. Viridans  Findings: no endocarditis, no vegetation  Normal EF Mild LA enlargement Normal EF 65-70% Trace Aortic regurgitation Mild mitral regurgitation  [name], MD

## 2014-04-11 ENCOUNTER — Inpatient Hospital Stay (HOSPITAL_COMMUNITY): Payer: Medicare Other

## 2014-04-11 LAB — BASIC METABOLIC PANEL
ANION GAP: 16 — AB (ref 5–15)
BUN: 22 mg/dL (ref 6–23)
CALCIUM: 8.9 mg/dL (ref 8.4–10.5)
CO2: 21 mEq/L (ref 19–32)
Chloride: 104 mEq/L (ref 96–112)
Creatinine, Ser: 0.92 mg/dL (ref 0.50–1.10)
GFR calc Af Amer: 68 mL/min — ABNORMAL LOW (ref >90)
GFR, EST NON AFRICAN AMERICAN: 59 mL/min — AB (ref >90)
GLUCOSE: 91 mg/dL (ref 70–99)
Potassium: 4 mEq/L (ref 3.7–5.3)
SODIUM: 141 meq/L (ref 137–147)

## 2014-04-11 LAB — CBC
HCT: 34 % — ABNORMAL LOW (ref 36.0–46.0)
Hemoglobin: 11.3 g/dL — ABNORMAL LOW (ref 12.0–15.0)
MCH: 29 pg (ref 26.0–34.0)
MCHC: 33.2 g/dL (ref 30.0–36.0)
MCV: 87.2 fL (ref 78.0–100.0)
PLATELETS: 271 10*3/uL (ref 150–400)
RBC: 3.9 MIL/uL (ref 3.87–5.11)
RDW: 13 % (ref 11.5–15.5)
WBC: 13.4 10*3/uL — ABNORMAL HIGH (ref 4.0–10.5)

## 2014-04-11 MED ORDER — CETYLPYRIDINIUM CHLORIDE 0.05 % MT LIQD
7.0000 mL | Freq: Two times a day (BID) | OROMUCOSAL | Status: DC
  Administered 2014-04-12 (×2): 7 mL via OROMUCOSAL

## 2014-04-11 MED ORDER — CHLORHEXIDINE GLUCONATE 0.12 % MT SOLN
15.0000 mL | Freq: Two times a day (BID) | OROMUCOSAL | Status: DC
  Administered 2014-04-11 – 2014-04-12 (×3): 15 mL via OROMUCOSAL
  Filled 2014-04-11 (×6): qty 15

## 2014-04-11 NOTE — Progress Notes (Signed)
PROGRESS NOTE  Alzora Ha ZWC:[id number] DOB: 09/05/1934 DOA: 04/06/2014 PCP: [name], MD  HPI/Recap of past 14 hours: 79 year old female admitted 10/26 with fever and back pain and perinephric stranding suspicious for pyelonephritis seen on CT. Patient started on IV Rocephin initially. Patient white count improving although still having low-grade temperature. Urine cultures with no growth, but blood cultures times multiple sets grew out gram-positive cocci in chains which by 10/29 positive for Streptococcus viridans. Discussed with infectious disease who increased dose of Rocephin to 2 g daily. Cardiology consulted for TEE.  Physical therapy on 10/29 concerned about right extremity weakness,. Nursing also reported some confusion. CT scan of the head done was unrevealing. Neurology consult at felt the patient's picture perhaps resembled more likely encephalopathy. TEE unrevealing.   Patient today much more awake and alert. Feeling better. Still having chronic back pain. No fevers 48 hours.  Assessment/plan Principal problem:   Strep viridans bacteremia/Pyelonephritis, acute: Still suspect it is primary cause. . Renal ultrasound notes no evidence of hydronephrosis, on Rocephin. In discussion with her patient, back pain is ongoing, however this has only been chronic 3 weeks ever since she tried to lift her husband. No workup done at that time, so we'll check a thoracic MRI to rule out both osteo-as well as occult fracture Active Problems:   Hypertension: Initially lower yesterday, so IV fluids increased. Blood pressure significantly improved in the last 24 hours as Lasix has been started. Suspect patient has underlying diastolic heart failure, see below, and she has diuresed 2 L since yesterday    COPD (chronic obstructive pulmonary disease): Stable during this hospitalization   Fever: Likely from strep viridans. No further fever since Rocephin dose increased.  Acute renal  failure): Resolved secondary to UTI and dehydration, improved with IV fluids    Dehydration: Resolved with IV fluids.  Acute on chronic diastolic heart failure: Systolic function preserved by TEE. Diastolic function not commented on. BNP up at close to 6000. Have stopped IV fluids and started IV Lasix  Moderate protein calorie malnutrition:Pt meets criteria for moderate MALNUTRITION in the context of chronic illness as evidenced by <75% of estimated energy intake x 1 month, mild fat and muscle depletion. Seen by nutrition. Started on Ensure twice a day.  Code Status: Full code  Family Communication: Spoke with patient's son at the bedside  Disposition Plan: Awaiting further workup including TEE and antibiotic choices   Consultants:  Infectious disease  Neurology  Cardiology  Procedures:  TEE done 10/30: No evidence of vegetation, preserved ejection fraction.  Antibiotics:  IV Rocephin 10/27-with dose increased 10/29-present  IV Zosyn 10/26-10/27  IV vancomycin 10/28-10/29   Objective: BP 155/49  Pulse 68  Temp(Src) 98.3 F (36.8 C) (Oral)  Resp 18  Ht 5\' 7"  (1.702 m)  Wt 56 kg (123 lb 7.3 oz)  BMI 19.33 kg/m2  SpO2 99%  Intake/Output Summary (Last 24 hours) at 04/11/14 1350 Last data filed at 04/11/14 0600  Gross per 24 hour  Intake   1170 ml  Output   2000 ml  Net   -830 ml   Filed Weights   04/08/14 0534 04/09/14 0500 04/11/14 0403  Weight: 56.4 kg (124 lb 5.4 oz) 59.5 kg (131 lb 2.8 oz) 56 kg (123 lb 7.3 oz)    Exam:   General:  Alert and oriented, fatigued  HEENT: Noted goiter. Patient has previous history of, nontender  Cardiovascular: Regular rate and rhythm, S1-S2  Respiratory: Clear to auscultation bilaterally  Abdomen: Soft, nontender, nondistended, positive bowel sounds, mild left flank pain-unchanged  Musculoskeletal: No clubbing or cyanosis or edema   Neuro: Nonfocal  Data Reviewed: Basic Metabolic Panel:  Recent Labs Lab  04/06/14 1530 04/07/14 0850 04/08/14 0612 04/09/14 0530 04/11/14 0625  NA 137 141 141 144 141  K 4.3 3.4* 4.5 5.5* 4.0  CL 97 102 109 112 104  CO2 23 24 20  18* 21  GLUCOSE 107* 96 104* 92 91  BUN 54* 53* 38* 27* 22  CREATININE 1.54* 1.48* 1.10 0.98 0.92  CALCIUM 10.1 8.9 8.6 8.9 8.9  MG  --  2.2  --   --   --    Liver Function Tests:  Recent Labs Lab 04/06/14 1530  AST 29  ALT 20  ALKPHOS 78  BILITOT 0.6  PROT 8.5*  ALBUMIN 2.9*   No results found for this basename: LIPASE, AMYLASE,  in the last 168 hours  Recent Labs Lab 04/09/14 1505  AMMONIA 30   CBC:  Recent Labs Lab 04/06/14 1530 04/08/14 0612 04/09/14 0530 04/11/14 0625  WBC 14.2* 11.0* 11.2* 13.4*  NEUTROABS 11.6* 8.5*  --   --   HGB 13.3 11.1* 10.8* 11.3*  HCT 39.5 33.5* 33.1* 34.0*  MCV 89.4 90.1 89.9 87.2  PLT 183 183 196 271   Cardiac Enzymes:   No results found for this basename: CKTOTAL, CKMB, CKMBINDEX, TROPONINI,  in the last 168 hours BNP (last 3 results)  Recent Labs  04/09/14 1947  PROBNP 5197.0*   CBG: No results found for this basename: GLUCAP,  in the last 168 hours  Recent Results (from the past 240 hour(s))  CULTURE, BLOOD (ROUTINE X 2)     Status: None   Collection Time    04/06/14  5:55 PM      Result Value Ref Range Status   Specimen Description BLOOD RIGHT FOREARM   Final   Special Requests BOTTLES DRAWN AEROBIC ONLY 2CC   Final   Culture  Setup Time     Final   Value: 04/07/2014 01:06     Performed at [facility]   Culture     Final   Value: VIRIDANS STREPTOCOCCUS     Note: Gram Stain Report Called to,Read Back By and Verified With: Amaryllis Dyke RN 900P     Performed at [facility]   Report Status 04/09/2014 FINAL   Final   Organism ID, Bacteria VIRIDANS STREPTOCOCCUS   Final  CULTURE, BLOOD (ROUTINE X 2)     Status: None   Collection Time    04/06/14  6:02 PM      Result Value Ref Range Status   Specimen Description BLOOD RIGHT  ANTECUBITAL   Final   Special Requests BOTTLES DRAWN AEROBIC AND ANAEROBIC 5CC   Final   Culture  Setup Time     Final   Value: 04/07/2014 01:06     Performed at [facility]   Culture     Final   Value: VIRIDANS STREPTOCOCCUS     Note: SUSCEPTIBILITIES PERFORMED ON PREVIOUS CULTURE WITHIN THE LAST 5 DAYS.     Note: Gram Stain Report Called to,Read Back By and Verified With: Joslyn Hy RN [phone]     Performed at [facility]   Report Status 04/09/2014 FINAL   Final  URINE CULTURE     Status: None   Collection Time    04/06/14  6:20 PM      Result Value Ref Range Status  Specimen Description URINE, CATHETERIZED   Final   Special Requests NONE   Final   Culture  Setup Time     Final   Value: 04/06/2014 18:55     Performed at [facility]     Final   Value: NO GROWTH     Performed at [facility]   Culture     Final   Value: NO GROWTH     Performed at [facility]   Report Status 04/07/2014 FINAL   Final  CULTURE, BLOOD (ROUTINE X 2)     Status: None   Collection Time    04/09/14 12:30 PM      Result Value Ref Range Status   Specimen Description BLOOD LEFT ARM   Final   Special Requests BOTTLES DRAWN AEROBIC ONLY [facility]   Final   Culture  Setup Time     Final   Value: 04/09/2014 17:55     Performed at [facility]   Culture     Final   Value:        BLOOD CULTURE RECEIVED NO GROWTH TO DATE CULTURE WILL BE HELD FOR 5 DAYS BEFORE ISSUING A FINAL NEGATIVE REPORT     Performed at [facility]   Report Status PENDING   Incomplete  CULTURE, BLOOD (ROUTINE X 2)     Status: None   Collection Time    04/09/14 12:50 PM      Result Value Ref Range Status   Specimen Description BLOOD LEFT HAND   Final   Special Requests BOTTLES DRAWN AEROBIC ONLY [place]   Final   Culture  Setup Time     Final   Value: 04/09/2014 17:55     Performed at [facility]   Culture     Final   Value:        BLOOD CULTURE RECEIVED  NO GROWTH TO DATE CULTURE WILL BE HELD FOR 5 DAYS BEFORE ISSUING A FINAL NEGATIVE REPORT     Performed at [facility]   Report Status PENDING   Incomplete     Studies: No results found.  Scheduled Meds: . cefTRIAXone (ROCEPHIN)  IV  2 g Intravenous Q24H  . diazepam  5 mg Oral TID  . diltiazem  120 mg Oral Daily  . enoxaparin (LOVENOX) injection  40 mg Subcutaneous Q24H  . furosemide  20 mg Intravenous BID  . labetalol  300 mg Oral BID    Continuous Infusions: . sodium chloride 20 mL/hr at 04/11/14 0600     Time spent: 25 minutes  [facility] Pager [phone]. If 7PM-7AM, please contact night-coverage at [email or web address], password [facility] 04/11/2014, 1:50 PM  LOS: 5 days

## 2014-04-11 NOTE — Progress Notes (Signed)
MRI T spine changed to w/o cm as pt couldn't tolerate any further imaging due to pain from being supine-

## 2014-04-12 LAB — CBC
HCT: 30.1 % — ABNORMAL LOW (ref 36.0–46.0)
HEMOGLOBIN: 10 g/dL — AB (ref 12.0–15.0)
MCH: 29.1 pg (ref 26.0–34.0)
MCHC: 33.2 g/dL (ref 30.0–36.0)
MCV: 87.5 fL (ref 78.0–100.0)
Platelets: 259 10*3/uL (ref 150–400)
RBC: 3.44 MIL/uL — ABNORMAL LOW (ref 3.87–5.11)
RDW: 13.2 % (ref 11.5–15.5)
WBC: 11 10*3/uL — AB (ref 4.0–10.5)

## 2014-04-12 LAB — BASIC METABOLIC PANEL
Anion gap: 13 (ref 5–15)
BUN: 30 mg/dL — AB (ref 6–23)
CO2: 23 mEq/L (ref 19–32)
Calcium: 8.5 mg/dL (ref 8.4–10.5)
Chloride: 102 mEq/L (ref 96–112)
Creatinine, Ser: 1.22 mg/dL — ABNORMAL HIGH (ref 0.50–1.10)
GFR calc Af Amer: 48 mL/min — ABNORMAL LOW (ref >90)
GFR calc non Af Amer: 42 mL/min — ABNORMAL LOW (ref >90)
GLUCOSE: 90 mg/dL (ref 70–99)
Potassium: 3.8 mEq/L (ref 3.7–5.3)
Sodium: 138 mEq/L (ref 137–147)

## 2014-04-12 LAB — PRO B NATRIURETIC PEPTIDE: Pro B Natriuretic peptide (BNP): 1678 pg/mL — ABNORMAL HIGH (ref 0–450)

## 2014-04-12 MED ORDER — LABETALOL HCL 200 MG PO TABS
200.0000 mg | ORAL_TABLET | Freq: Two times a day (BID) | ORAL | Status: DC
  Administered 2014-04-12 – 2014-04-15 (×5): 200 mg via ORAL
  Filled 2014-04-12 (×7): qty 1

## 2014-04-12 NOTE — Progress Notes (Signed)
PROGRESS NOTE  Lisa [name] LNL:[id number] DOB: August 01, 1934 DOA: 04/06/2014 PCP: [name], MD  HPI/Recap of past 66 hours: 79 year old female admitted 10/26 with fever and back pain and perinephric stranding suspicious for pyelonephritis seen on CT. Patient started on IV Rocephin initially. Patient white count improving although still having low-grade temperature. Urine cultures with no growth, but blood cultures times multiple sets grew out gram-positive cocci in chains which by 10/29 positive for Streptococcus viridans. Discussed with infectious disease who increased dose of Rocephin to 2 g daily. Cardiology consulted for TEE.  Physical therapy on 10/29 concerned about right extremity weakness,. Nursing also reported some confusion. CT scan of the head done was unrevealing. Neurology consult at felt the patient's picture perhaps resembled more likely encephalopathy. TEE unrevealing.   Patient continues to improve over the last few days.  Diuresing well. Remaining alert  Assessment/plan Principal problem:   Strep viridans bacteremia/Pyelonephritis, acute: Still suspect it is primary cause. . Renal ultrasound notes no evidence of hydronephrosis, on Rocephin. In discussion with her patient, back pain is ongoing, however this has only been chronic 3 weeks ever since she tried to lift her husband. Thoracic MRI limited due to patient's pain, however no evidence of infection or fracture. Active Problems:   Hypertension: Initially lower yesterday, so IV fluids increased. Blood pressure significantly improved in the last 24 hours as Lasix has been started. Suspect patient has underlying diastolic heart failure. Output not being measured. Still, patient's pressure has come down accordingly and Will start to titrate down hydralazine.BNP still elevated, but improved. Continue Lasix.    COPD (chronic obstructive pulmonary disease): Stable during this hospitalization   Fever: Likely from strep  viridans. No further fever since Rocephin dose increased.Will discuss with ID for length of time needed  Acute renal failure): Resolved secondary to UTI and dehydration, improved with IV fluids    Dehydration: Resolved with IV fluids.  Acute on chronic diastolic heart failure: Systolic function preserved by TEE. Diastolic function not commented on. BNP up at close to 6000. Have stopped IV fluids and started IV Lasix  Moderate protein calorie malnutrition:Pt meets criteria for moderate MALNUTRITION in the context of chronic illness as evidenced by <75% of estimated energy intake x 1 month, mild fat and muscle depletion. Seen by nutrition. Started on Ensure twice a day.  Code Status: Full code  Family Communication: spoke with patient's son by phone yesterday  Disposition Plan: likely discharge in the next 1-2 days once antibiotic regimen set   Consultants:  Infectious disease  Neurology  Cardiology  Procedures:  TEE done 10/30: No evidence of vegetation, preserved ejection fraction.  Antibiotics:  IV Rocephin 10/27-with dose increased 10/29-present  IV Zosyn 10/26-10/27  IV vancomycin 10/28-10/29   Objective: BP 132/54 mmHg  Pulse 64  Temp(Src) 98.1 F (36.7 C) (Oral)  Resp 19  Ht 5\' 7"  (1.702 m)  Wt 56.2 kg (123 lb 14.4 oz)  BMI 19.40 kg/m2  SpO2 95%  Intake/Output Summary (Last 24 hours) at 04/12/14 1335 Last data filed at 04/12/14 1100  Gross per 24 hour  Intake    830 ml  Output      0 ml  Net    830 ml   Filed Weights   04/09/14 0500 04/11/14 0403 04/12/14 0442  Weight: 59.5 kg (131 lb 2.8 oz) 56 kg (123 lb 7.3 oz) 56.2 kg (123 lb 14.4 oz)    Exam:   General:  Resting comfortably  HEENT: goiter noted  Cardiovascular:  Regular rate and rhythm, D3-O6, 2/6 systolic ejection murmur  Respiratory: Clear to auscultation bilaterally  Abdomen: Soft, nontender, nondistended, positive bowel sounds,  Musculoskeletal: No clubbing or cyanosis or edema     Neuro: Nonfocal  Data Reviewed: Basic Metabolic Panel:  Recent Labs Lab 04/07/14 0850 04/08/14 0612 04/09/14 0530 04/11/14 0625 04/12/14 0544  NA 141 141 144 141 138  K 3.4* 4.5 5.5* 4.0 3.8  CL 102 109 112 104 102  CO2 24 20 18* 21 23  GLUCOSE 96 104* 92 91 90  BUN 53* 38* 27* 22 30*  CREATININE 1.48* 1.10 0.98 0.92 1.22*  CALCIUM 8.9 8.6 8.9 8.9 8.5  MG 2.2  --   --   --   --    Liver Function Tests:  Recent Labs Lab 04/06/14 1530  AST 29  ALT 20  ALKPHOS 78  BILITOT 0.6  PROT 8.5*  ALBUMIN 2.9*   No results for input(s): LIPASE, AMYLASE in the last 168 hours.  Recent Labs Lab 04/09/14 1505  AMMONIA 30   CBC:  Recent Labs Lab 04/06/14 1530 04/08/14 0612 04/09/14 0530 04/11/14 0625 04/12/14 0544  WBC 14.2* 11.0* 11.2* 13.4* 11.0*  NEUTROABS 11.6* 8.5*  --   --   --   HGB 13.3 11.1* 10.8* 11.3* 10.0*  HCT 39.5 33.5* 33.1* 34.0* 30.1*  MCV 89.4 90.1 89.9 87.2 87.5  PLT 183 183 196 271 259   Cardiac Enzymes:   No results for input(s): CKTOTAL, CKMB, CKMBINDEX, TROPONINI in the last 168 hours. BNP (last 3 results)  Recent Labs  04/09/14 1947 04/12/14 0830  PROBNP 5197.0* 1678.0*   CBG: No results for input(s): GLUCAP in the last 168 hours.  Recent Results (from the past 240 hour(s))  Blood culture (routine x 2)     Status: None   Collection Time: 04/06/14  5:55 PM  Result Value Ref Range Status   Specimen Description BLOOD RIGHT FOREARM  Final   Special Requests BOTTLES DRAWN AEROBIC ONLY 2CC  Final   Culture  Setup Time   Final    04/07/2014 01:06 Performed at [facility]   Culture   Final    VIRIDANS STREPTOCOCCUS Note: Gram Stain Report Called to,Read Back By and Verified With: Amaryllis Dyke RN 900P Performed at [facility]   Report Status 04/09/2014 FINAL  Final   Organism ID, Bacteria VIRIDANS STREPTOCOCCUS  Final      Susceptibility   Viridans streptococcus - MIC (ETEST)*    CEFTRIAXONE 1.0 SENSITIVE  Sensitive     LEVOFLOXACIN 1.0 SENSITIVE Sensitive     PENICILLIN 1.0 INTERMEDIATE Intermediate     * VIRIDANS STREPTOCOCCUS  Blood culture (routine x 2)     Status: None   Collection Time: 04/06/14  6:02 PM  Result Value Ref Range Status   Specimen Description BLOOD RIGHT ANTECUBITAL  Final   Special Requests BOTTLES DRAWN AEROBIC AND ANAEROBIC 5CC  Final   Culture  Setup Time   Final    04/07/2014 01:06 Performed at [facility]   Culture   Final    VIRIDANS STREPTOCOCCUS Note: SUSCEPTIBILITIES PERFORMED ON PREVIOUS CULTURE WITHIN THE LAST 5 DAYS. Note: Gram Stain Report Called to,Read Back By and Verified With: Joslyn Hy RN [phone] Performed at [facility]   Report Status 04/09/2014 FINAL  Final  Urine culture     Status: None   Collection Time: 04/06/14  6:20 PM  Result Value Ref Range Status   Specimen Description  URINE, CATHETERIZED  Final   Special Requests NONE  Final   Culture  Setup Time   Final    04/06/2014 18:55 Performed at [place] Performed at [facility]  Final   Culture NO GROWTH Performed at [facility]  Final   Report Status 04/07/2014 FINAL  Final  Culture, blood (routine x 2)     Status: None (Preliminary result)   Collection Time: 04/09/14 12:30 PM  Result Value Ref Range Status   Specimen Description BLOOD LEFT ARM  Final   Special Requests BOTTLES DRAWN AEROBIC ONLY [facility]  Final   Culture  Setup Time   Final    04/09/2014 17:55 Performed at [facility]   Culture   Final           BLOOD CULTURE RECEIVED NO GROWTH TO DATE CULTURE WILL BE HELD FOR 5 DAYS BEFORE ISSUING A FINAL NEGATIVE REPORT Performed at [facility]   Report Status PENDING  Incomplete  Culture, blood (routine x 2)     Status: None (Preliminary result)   Collection Time: 04/09/14 12:50 PM  Result Value Ref Range Status   Specimen Description BLOOD LEFT HAND  Final   Special Requests BOTTLES  DRAWN AEROBIC ONLY [place]  Final   Culture  Setup Time   Final    04/09/2014 17:55 Performed at [facility]   Culture   Final           BLOOD CULTURE RECEIVED NO GROWTH TO DATE CULTURE WILL BE HELD FOR 5 DAYS BEFORE ISSUING A FINAL NEGATIVE REPORT Performed at [facility]   Report Status PENDING  Incomplete     Studies: Mr Thoracic Spine Wo Contrast  04/11/2014   CLINICAL DATA:  Streptococcal infection.  Back pain 3 weeks.  EXAM: MRI THORACIC SPINE WITHOUT CONTRAST  TECHNIQUE: Multiplanar, multisequence MR imaging of the thoracic spine was performed. No intravenous contrast was administered.  COMPARISON:  None.  FINDINGS: The patient was in pain and could not hold still. Intravenous contrast not administered.  Negative for fracture or mass. No bone marrow edema. No evidence of discitis. No cord compression. Spinal cord signal is normal. Spinal canal adequate in size.  Mild disc degeneration at T11-T12. Mild disc degeneration at T5-6. Otherwise no significant degenerative change.  IMPRESSION: Exam quality is limited by motion.  No acute abnormality.  No evidence of fracture, mass, or infection.   Electronically Signed   By: Franchot Gallo M.D.   On: 04/11/2014 17:25    Scheduled Meds: . antiseptic oral rinse  7 mL Mouth Rinse q12n4p  . cefTRIAXone (ROCEPHIN)  IV  2 g Intravenous Q24H  . chlorhexidine  15 mL Mouth Rinse BID  . diazepam  5 mg Oral TID  . diltiazem  120 mg Oral Daily  . enoxaparin (LOVENOX) injection  40 mg Subcutaneous Q24H  . furosemide  20 mg Intravenous BID  . labetalol  200 mg Oral BID    Continuous Infusions:     Time spent: 15 minutes  [place] [facility] Pager [phone]. If 7PM-7AM, please contact night-coverage at [email or web address], password [facility] 04/12/2014, 1:35 PM  LOS: 6 days

## 2014-04-12 NOTE — Plan of Care (Signed)
Problem: Phase III Progression Outcomes Goal: Afebrile, Vital Signs remain stable Outcome: Progressing

## 2014-04-13 ENCOUNTER — Encounter (HOSPITAL_COMMUNITY): Payer: Self-pay | Admitting: Cardiology

## 2014-04-13 DIAGNOSIS — B954 Other streptococcus as the cause of diseases classified elsewhere: Secondary | ICD-10-CM

## 2014-04-13 LAB — BASIC METABOLIC PANEL
Anion gap: 12 (ref 5–15)
BUN: 29 mg/dL — ABNORMAL HIGH (ref 6–23)
CHLORIDE: 100 meq/L (ref 96–112)
CO2: 27 mEq/L (ref 19–32)
CREATININE: 1.17 mg/dL — AB (ref 0.50–1.10)
Calcium: 9.3 mg/dL (ref 8.4–10.5)
GFR calc non Af Amer: 44 mL/min — ABNORMAL LOW (ref >90)
GFR, EST AFRICAN AMERICAN: 51 mL/min — AB (ref >90)
GLUCOSE: 97 mg/dL (ref 70–99)
Potassium: 4.1 mEq/L (ref 3.7–5.3)
Sodium: 139 mEq/L (ref 137–147)

## 2014-04-13 MED ORDER — FUROSEMIDE 40 MG PO TABS
40.0000 mg | ORAL_TABLET | Freq: Every day | ORAL | Status: DC
  Administered 2014-04-15: 40 mg via ORAL
  Filled 2014-04-13 (×2): qty 1

## 2014-04-13 MED ORDER — ENSURE COMPLETE PO LIQD: 237.0000 mL | Freq: Two times a day (BID) | ORAL | Status: DC

## 2014-04-13 NOTE — Clinical Social Work Psychosocial (Signed)
Clinical Social Work Department BRIEF PSYCHOSOCIAL ASSESSMENT 04/13/2014  Patient:  Lisa [name], Lisa [name]     Account Number:  [id number]     Admit date:  04/06/2014  Clinical Social Worker:  Lovey Newcomer  Date/Time:  04/13/2014 04:45 PM  Referred by:  Physician  Date Referred:  04/13/2014 Referred for  SNF Placement   Other Referral:   NA   Interview type:  Other - See comment Other interview type:   Patient unable to contribute to assessment. Patient's brother interviewed by phone.    PSYCHOSOCIAL DATA Living Status:  HUSBAND Admitted from facility:   Level of care:   Primary support name:  Leane Para and Juanda Crumble Primary support relationship to patient:  SPOUSE Degree of support available:   Support is good.    CURRENT CONCERNS Current Concerns  Post-Acute Placement   Other Concerns:   NA    SOCIAL WORK ASSESSMENT / PLAN CSW attempted to assess patient at bedside. Patient unable to contribute to assessment. Per RN patient's brother wants to be contacted regarding DC plan. Per RNCM, patient is now requiring SNF placement, original plan was for return home with HHPT. CSW spoke with Juanda Crumble (brother) by phone to complete assessment. Juanda Crumble states that the patient lives with her husband. Per Juanda Crumble the husband was recently hospitalized for a stroke and his mentation has been very poor since. Juanda Crumble states that Leane Para does not have the capacity to make a decision regarding the patient's DC plan. CSW explained that RNCM had approached CSW and stated that the family would like SNF placement for the patient. Juanda Crumble states that this is not the case and that the family will not consider SNF placement for the patient. Juanda Crumble states that the family plans to bring the patient home with HHPT. CSW explained that the treatment team's recommendation is for SNF placement and that if the patient returns home the family needs to ensure the patient has 24hr supervision. Juanda Crumble states  that he would like to speak with the [facility] regarding the services the patient will be eligible for in the home (CSW has left voicemail for [facility]). CSW explained that the patient will not have services available in the home 24/7 and that PT will only come to the home a maximum of 3 times per week for less than an hour. Juanda Crumble still adamantly refuses placing his sister into a SNF. CSW senses Juanda Crumble feels responsible for taking care of his sister. Charles plans to assist with caring for the patient at home along with the patient's other sibling Lelan Pons.   Assessment/plan status:  No Further Intervention Required Other assessment/ plan:   NONE, family is planning to take patient home and does NOT request SNF placement for patient.   Information/referral to community resources:   NONE    PATIENT'S/FAMILY'S RESPONSE TO PLAN OF CARE: Patient's family has no intention of placing patient in a SNF. Family states that they want maximum home health services for patient and will bring her home at discharge. No other CSW needs noted. CSW signing off at this time.       Liz Beach MSW, [place], [place], [phone]

## 2014-04-13 NOTE — Progress Notes (Signed)
No new order received for current BP, will cont to monitor.

## 2014-04-13 NOTE — Evaluation (Signed)
Clinical/Bedside Swallow Evaluation Patient Details  Name: Lisa [name] MRN: [id number] Date of Birth: 1935-04-13  Today's Date: 04/13/2014 Time: 6948-5462 SLP Time Calculation (min): 24 min  Past Medical History:  Past Medical History  Diagnosis Date  . COPD (chronic obstructive pulmonary disease)   . Hypertension   . Pyelonephritis 04/06/2014    UTI   Past Surgical History:  Past Surgical History  Procedure Laterality Date  . Abdominal hysterectomy    . Tee without cardioversion N/A 04/10/2014    Procedure: TRANSESOPHAGEAL ECHOCARDIOGRAM (TEE);  Surgeon: [name], MD;  Location: [facility] ENDOSCOPY;  Service: Cardiovascular;  Laterality: N/A;   HPI:  79 year old female admitted 10/26 with fever and back pain and perinephric stranding suspicious for pyelonephritis seen on CT. CXR without acute disease. PMH significant for COPD.   Assessment / Plan / Recommendation Clinical Impression  Pt has decreased sustained attention, awareness, and ability to follow one-step commands which impacts her ability to safely consume PO at this time. Pt requires Max multimodal cueing from SLP for oral acceptance, labial closure, and posterior transit of all solid and liquid POs presented, with limited awareness with anterior loss and oral residuals. Suspect premature spillage and/or delayed swallow initiation that results in likely aspiration of thin liquids given immediate coughing. Pt did appear to better tolerate Dys 1 textures and nectar thick liquids with Max A from SLP, although trials were limited given patient participation at this time. Recommend to downgrade diet to Dys 1 with nectar thick liquids, with full supervision to faciltiate intake. SLP to continue to follow.    Aspiration Risk  Moderate    Diet Recommendation Dysphagia 1 (Puree);Nectar-thick liquid   Liquid Administration via: Cup;No straw Medication Administration: Crushed with puree Supervision: Staff to assist with self  feeding;Full supervision/cueing for compensatory strategies Compensations: Slow rate;Small sips/bites;Check for pocketing;Check for anterior loss (check for oral holding) Postural Changes and/or Swallow Maneuvers: Seated upright 90 degrees    Other  Recommendations Oral Care Recommendations: Oral care BID Other Recommendations: Order thickener from pharmacy;Prohibited food (jello, ice cream, thin soups);Remove water pitcher   Follow Up Recommendations  Skilled Nursing facility    Frequency and Duration min 2x/week  2 weeks   Pertinent Vitals/Pain n/a    SLP Swallow Goals     Swallow Study Prior Functional Status       General Date of Onset: 05/07/14 HPI: 79 year old female admitted 10/26 with fever and back pain and perinephric stranding suspicious for pyelonephritis seen on CT. CXR without acute disease. PMH significant for COPD. Type of Study: Bedside swallow evaluation Previous Swallow Assessment: none in chart Diet Prior to this Study: Regular;Thin liquids Temperature Spikes Noted: No Respiratory Status: Room air History of Recent Intubation: No Behavior/Cognition: Alert;Requires cueing Self-Feeding Abilities: Total assist Patient Positioning: Upright in bed Baseline Vocal Quality: Low vocal intensity Volitional Cough: Weak Volitional Swallow: Unable to elicit    Oral/Motor/Sensory Function Overall Oral Motor/Sensory Function: Other (comment) (difficulty following commands to complete)   Ice Chips Ice chips: Impaired Presentation: Spoon Oral Phase Impairments: Poor awareness of bolus Oral Phase Functional Implications: Oral holding   Thin Liquid Thin Liquid: Impaired Presentation: Cup;Straw Oral Phase Impairments: Poor awareness of bolus;Reduced labial seal Oral Phase Functional Implications: Right anterior spillage;Oral holding Pharyngeal  Phase Impairments: Suspected delayed Swallow;Cough - Immediate;Cough - Delayed    Nectar Thick Nectar Thick Liquid:  Impaired Presentation: Cup Oral Phase Impairments: Reduced labial seal;Poor awareness of bolus Oral phase functional implications: Oral holding Pharyngeal Phase Impairments: Suspected  delayed Swallow   Honey Thick Honey Thick Liquid: Not tested   Puree Puree: Impaired Presentation: Spoon Oral Phase Impairments: Poor awareness of bolus;Reduced lingual movement/coordination;Impaired anterior to posterior transit Oral Phase Functional Implications: Oral residue;Oral holding;Prolonged oral transit Pharyngeal Phase Impairments: Suspected delayed Swallow   Solid   GO    Solid: Not tested        Germain Osgood, M.A. CCC-SLP [phone]  Germain Osgood 04/13/2014,4:22 PM

## 2014-04-13 NOTE — Progress Notes (Addendum)
[facility] PROGRESS NOTE  Lisa [name] OHY:[id number] DOB: 1935-04-25 DOA: 04/06/2014 PCP: [name], MD  Assessment/Plan:  Principal Problem:   Streptococcus viridans infection - infectious disease on board and assisting with management - patient to receive ceftriaxone for 2 weeks total with 2 g IV administered daily. Last dose to be administered on November 11. - husband recently had stroke and patient will have limited help at home. As such husband and I think it is probably a better idea to transition patient to skilled nursing facility on discharge. -addendum: plan will be to place picc line for prolonged IV antibiotic administration  Active Problems:  Acute on chronic diastolic heart failure - patient on IV Lasix and according to weight has lost 4 pounds of fluid. Currently she is breathing comfortably on room air. No increased work of breathing. - We'll transition to oral Lasix regimen next a.m. - Condition currently improving.    Hypertension -Cardizem - Hydralazine    COPD (chronic obstructive pulmonary disease) - stable, will continue albuterol    UTI (lower urinary tract infection) - Continue rocephin    ARF (acute renal failure) - Steady near 1.0  - Will continue home lasix regimen next am.    Leukocytosis - Most likely 2ary to strep viridans bacteremia     Moderate protein-calorie malnutrition Pt meets criteria for moderate MALNUTRITION in the context of chronic illness as evidenced by <75% of estimated energy intake x 1 month, mild fat and muscle depletion. - RD on board - Will obtain speech therapy evaluation - continue ensure supplementation.  Code Status: full Family Communication: None Disposition Plan: SNF once ready for d/c   Consultants:  ID  Neurology  Procedures:  Pt has had thoracic MRI with no evidence of infection or fx reported.  Antibiotics:  As listed above  HPI/Subjective: Pt has no new complaints. No acute  issues reported overnight.  Objective: Filed Vitals:   04/13/14 1020  BP: 152/46  Pulse: 59  Temp: 99 F (37.2 C)  Resp: 20    Intake/Output Summary (Last 24 hours) at 04/13/14 1234 Last data filed at 04/13/14 1048  Gross per 24 hour  Intake    435 ml  Output   1350 ml  Net   -915 ml   Filed Weights   04/11/14 0403 04/12/14 0442 04/13/14 0500  Weight: 56 kg (123 lb 7.3 oz) 56.2 kg (123 lb 14.4 oz) 54.3 kg (119 lb 11.4 oz)    Exam:   General:  Pt in nad, alert and awake  Cardiovascular: rrr, no mrg  Respiratory: cta bl, no wheezes  Abdomen: soft, NT, ND  Musculoskeletal: no cyanosis or clubbing   Data Reviewed: Basic Metabolic Panel:  Recent Labs Lab 04/07/14 0850 04/08/14 0612 04/09/14 0530 04/11/14 0625 04/12/14 0544 04/13/14 0456  NA 141 141 144 141 138 139  K 3.4* 4.5 5.5* 4.0 3.8 4.1  CL 102 109 112 104 102 100  CO2 24 20 18* 21 23 27   GLUCOSE 96 104* 92 91 90 97  BUN 53* 38* 27* 22 30* 29*  CREATININE 1.48* 1.10 0.98 0.92 1.22* 1.17*  CALCIUM 8.9 8.6 8.9 8.9 8.5 9.3  MG 2.2  --   --   --   --   --    Liver Function Tests:  Recent Labs Lab 04/06/14 1530  AST 29  ALT 20  ALKPHOS 78  BILITOT 0.6  PROT 8.5*  ALBUMIN 2.9*   No results for input(s): LIPASE, AMYLASE in  the last 168 hours.  Recent Labs Lab 04/09/14 1505  AMMONIA 30   CBC:  Recent Labs Lab 04/06/14 1530 04/08/14 0612 04/09/14 0530 04/11/14 0625 04/12/14 0544  WBC 14.2* 11.0* 11.2* 13.4* 11.0*  NEUTROABS 11.6* 8.5*  --   --   --   HGB 13.3 11.1* 10.8* 11.3* 10.0*  HCT 39.5 33.5* 33.1* 34.0* 30.1*  MCV 89.4 90.1 89.9 87.2 87.5  PLT 183 183 196 271 259   Cardiac Enzymes: No results for input(s): CKTOTAL, CKMB, CKMBINDEX, TROPONINI in the last 168 hours. BNP (last 3 results)  Recent Labs  04/09/14 1947 04/12/14 0830  PROBNP 5197.0* 1678.0*   CBG: No results for input(s): GLUCAP in the last 168 hours.  Recent Results (from the past 240 hour(s))  Blood  culture (routine x 2)     Status: None   Collection Time: 04/06/14  5:55 PM  Result Value Ref Range Status   Specimen Description BLOOD RIGHT FOREARM  Final   Special Requests BOTTLES DRAWN AEROBIC ONLY 2CC  Final   Culture  Setup Time   Final    04/07/2014 01:06 Performed at [facility]   Culture   Final    VIRIDANS STREPTOCOCCUS Note: Gram Stain Report Called to,Read Back By and Verified With: Amaryllis Dyke RN 900P Performed at [facility]   Report Status 04/09/2014 FINAL  Final   Organism ID, Bacteria VIRIDANS STREPTOCOCCUS  Final      Susceptibility   Viridans streptococcus - MIC (ETEST)*    CEFTRIAXONE 1.0 SENSITIVE Sensitive     LEVOFLOXACIN 1.0 SENSITIVE Sensitive     PENICILLIN 1.0 INTERMEDIATE Intermediate     * VIRIDANS STREPTOCOCCUS  Blood culture (routine x 2)     Status: None   Collection Time: 04/06/14  6:02 PM  Result Value Ref Range Status   Specimen Description BLOOD RIGHT ANTECUBITAL  Final   Special Requests BOTTLES DRAWN AEROBIC AND ANAEROBIC 5CC  Final   Culture  Setup Time   Final    04/07/2014 01:06 Performed at [facility]   Culture   Final    VIRIDANS STREPTOCOCCUS Note: SUSCEPTIBILITIES PERFORMED ON PREVIOUS CULTURE WITHIN THE LAST 5 DAYS. Note: Gram Stain Report Called to,Read Back By and Verified With: Joslyn Hy RN [phone] Performed at [facility]   Report Status 04/09/2014 FINAL  Final  Urine culture     Status: None   Collection Time: 04/06/14  6:20 PM  Result Value Ref Range Status   Specimen Description URINE, CATHETERIZED  Final   Special Requests NONE  Final   Culture  Setup Time   Final    04/06/2014 18:55 Performed at [place] Performed at [facility]  Final   Culture NO GROWTH Performed at [facility]  Final   Report Status 04/07/2014 FINAL  Final  Culture, blood (routine x 2)     Status: None (Preliminary result)   Collection Time:  04/09/14 12:30 PM  Result Value Ref Range Status   Specimen Description BLOOD LEFT ARM  Final   Special Requests BOTTLES DRAWN AEROBIC ONLY [facility]  Final   Culture  Setup Time   Final    04/09/2014 17:55 Performed at [facility]   Culture   Final           BLOOD CULTURE RECEIVED NO GROWTH TO DATE CULTURE WILL BE HELD FOR 5 DAYS BEFORE ISSUING A FINAL NEGATIVE REPORT Performed at  [facility]   Report Status PENDING  Incomplete  Culture, blood (routine x 2)     Status: None (Preliminary result)   Collection Time: 04/09/14 12:50 PM  Result Value Ref Range Status   Specimen Description BLOOD LEFT HAND  Final   Special Requests BOTTLES DRAWN AEROBIC ONLY [place]  Final   Culture  Setup Time   Final    04/09/2014 17:55 Performed at [facility]   Culture   Final           BLOOD CULTURE RECEIVED NO GROWTH TO DATE CULTURE WILL BE HELD FOR 5 DAYS BEFORE ISSUING A FINAL NEGATIVE REPORT Performed at [facility]   Report Status PENDING  Incomplete     Studies: Mr Thoracic Spine Wo Contrast  04/11/2014   CLINICAL DATA:  Streptococcal infection.  Back pain 3 weeks.  EXAM: MRI THORACIC SPINE WITHOUT CONTRAST  TECHNIQUE: Multiplanar, multisequence MR imaging of the thoracic spine was performed. No intravenous contrast was administered.  COMPARISON:  None.  FINDINGS: The patient was in pain and could not hold still. Intravenous contrast not administered.  Negative for fracture or mass. No bone marrow edema. No evidence of discitis. No cord compression. Spinal cord signal is normal. Spinal canal adequate in size.  Mild disc degeneration at T11-T12. Mild disc degeneration at T5-6. Otherwise no significant degenerative change.  IMPRESSION: Exam quality is limited by motion.  No acute abnormality.  No evidence of fracture, mass, or infection.   Electronically Signed   By: Franchot Gallo M.D.   On: 04/11/2014 17:25    Scheduled Meds: . cefTRIAXone (ROCEPHIN)  IV  2 g Intravenous  Q24H  . diazepam  5 mg Oral TID  . diltiazem  120 mg Oral Daily  . enoxaparin (LOVENOX) injection  40 mg Subcutaneous Q24H  . feeding supplement (ENSURE COMPLETE)  237 mL Oral BID BM  . [START ON 04/14/2014] furosemide  40 mg Oral Daily  . labetalol  200 mg Oral BID   Continuous Infusions:    Time spent: > 35 minutes   Velvet Bathe  [facility] Pager [phone]. If 7PM-7AM, please contact night-coverage at [email or web address], password [facility] 04/13/2014, 12:34 PM  LOS: 7 days

## 2014-04-13 NOTE — Progress Notes (Signed)
Physical Therapy Treatment Patient Details Name: Lisa [name] MRN: [id number] DOB: September 15, 1934 Today's Date: 04/13/2014    History of Present Illness   79 year old female admitted 10/26 with fever and back pain and perinephric stranding suspicious for pyelonephritis seen on CT. Patient started on IV Rocephin initially. Patient white count improving although still having low-grade temperature. Urine cultures with no growth, but blood cultures times multiple sets grew out gram-positive cocci in chains which by 10/29 positive for Streptococcus viridans. Discussed with infectious disease who increased dose of Rocephin to 2 g daily. Cardiology consulted for TEE.    PT Comments    Pt currently with functional limitations due to the weakness, pain and altered mental status. Pt will benefit from skilled PT to increase her independence and safety with mobility to allow discharge to SNF with 24 hour assistance. Pt had pain with all mobility and poorly tolerated sitting EOB. PT communicated to nurse concern about Pt's ability to safely and effectively feed herself.     Follow Up Recommendations  SNF;Supervision/Assistance - 24 hour     Equipment Recommendations       Recommendations for Other Services Speech consult;OT consult     Precautions / Restrictions Precautions Precautions: Fall Restrictions Weight Bearing Restrictions: No    Mobility  Bed Mobility Overal bed mobility: Needs Assistance;+2 for physical assistance Bed Mobility: Rolling;Supine to Sit;Sit to Supine Rolling: Max assist   Supine to sit: Max assist;+2 for physical assistance Sit to supine: Max assist;+2 for physical assistance   General bed mobility comments: Pt required max assist performing supine to sit and sit to supine. PT had to utilize pad under patient to go from supine to sit.   Transfers Overall transfer level: Needs assistance Equipment used: None Transfers: Sit to/from Stand Sit to Stand: Total  assist;+2 physical assistance         General transfer comment: Pt seated EOB. Able to maintain sitting on EOB with moderate PT assistance. Pt required blocking at knees and two person total assist to rise from bed. Pt's knees buckled upon attempting to stand and PTs were unable to lift patient. Pt complained of severe pain with movement thus Pt placed back supine in bed and repositioned.   Ambulation/Gait                 Stairs            Wheelchair Mobility    Modified Rankin (Stroke Patients Only)       Balance Overall balance assessment: Needs assistance Sitting-balance support: Feet supported;No upper extremity supported Sitting balance-Leahy Scale: Poor                              Cognition Arousal/Alertness: Lethargic Behavior During Therapy: Flat affect Overall Cognitive Status: No family/caregiver present to determine baseline cognitive functioning                      Exercises      General Comments General comments (skin integrity, edema, etc.): Pt appeared in great discomfort with all movement. Pt had limited understandable communication throughout session.       Pertinent Vitals/Pain Pain Assessment:  (Pt continued to say "ouch"; unable to ID location/severity) Pain Intervention(s): Repositioned    Home Living                      Prior Function  PT Goals (current goals can now be found in the care plan section) Progress towards PT goals: Not progressing toward goals - comment (Pt function less than previously documented session.)    Frequency  Min 3X/week    PT Plan Current plan remains appropriate    Co-evaluation             End of Session Equipment Utilized During Treatment: Gait belt Activity Tolerance: Patient limited by lethargy;Patient limited by pain Patient left: in bed;with call bell/phone within reach;with bed alarm set     Time: 8022-3361 PT Time Calculation (min): 23  min  Charges:  $Therapeutic Activity: 8-22 mins                    G CodesJearld Shines, SPT  04/13/2014, 2:51 PM

## 2014-04-13 NOTE — Progress Notes (Signed)
Pt had am BP of 179/59.pt c/o back pain and  Medicated. Rechecked bp post medication is still 175/49. Lamar Blinks, NP paged . Will cont to monitor.

## 2014-04-13 NOTE — Plan of Care (Signed)
Problem: Phase II Progression Outcomes Goal: Vital signs remain stable, temperature < 100 Outcome: Completed/Met Date Met:  04/13/14

## 2014-04-13 NOTE — Progress Notes (Signed)
SLP Cancellation Note  Patient Details Name: Lisa [name] MRN: [id number] DOB: [birth year]   Cancelled treatment:       Reason Eval/Treat Not Completed: Other (comment): staff currently assisting patient with bathing, and requested that SLP return at a later time. SLP will return as schedule allows to complete clinical swallow evaluation.    Lisa [name], M.A. CCC-SLP [phone]  Lisa [name] 04/13/2014, 3:46 PM

## 2014-04-14 MED ORDER — STARCH (THICKENING) PO POWD
ORAL | Status: DC | PRN
  Filled 2014-04-14: qty 227

## 2014-04-14 MED ORDER — SODIUM CHLORIDE 0.9 % IJ SOLN: 10.0000 mL | INTRAMUSCULAR | Status: DC | PRN

## 2014-04-14 MED ORDER — KCL IN DEXTROSE-NACL 20-5-0.45 MEQ/L-%-% IV SOLN
INTRAVENOUS | Status: DC
  Administered 2014-04-14: 18:00:00 via INTRAVENOUS
  Filled 2014-04-14 (×3): qty 1000

## 2014-04-14 NOTE — Plan of Care (Signed)
Problem: Phase III Progression Outcomes Goal: Pain controlled on oral analgesia Outcome: Progressing     

## 2014-04-14 NOTE — Progress Notes (Signed)
Speech Language Pathology Treatment: Dysphagia  Patient Details Name: Lisa [name] MRN: [id number] DOB: [birth year] Today's Date: 04/14/2014 Time: 1130-1200 SLP Time Calculation (min): 30 min  Assessment / Plan / Recommendation Clinical Impression  Pt restless, moaning, crying when HOB elevated.  Mouth is coated white; ? Thrush?  RN notified.  Pt took sips of nectar liquid, requiring verbal and tactile cues to "close your lips" and "swallow".  Oral holding noted with puree, requiring max cues to swallow.  Nursing reports pt held each bolus and required oral suctioning at breakfast, with very little po being swallowed.  Pt is unable to focus/concentrate at this time on po intake and appears to be in pain.  Pt is at risk for aspiration as well as malnutrition with current cognitive status.  Consider change to NPO.  Pt is unable to participate in MBS at this time.   HPI HPI: 79 year old female admitted 10/26 with fever and back pain and perinephric stranding suspicious for pyelonephritis seen on CT. CXR without acute disease. PMH significant for COPD.   Pertinent Vitals Pain Assessment: 0-10 Pain Score: 9  Faces Pain Scale: Hurts whole lot Pain Location:  Back Pain Intervention(s):  (RN notified)  SLP Plan  Continue with current plan of care;MBS (MBS when able to cooperate)    Recommendations Diet recommendations: Dysphagia 1 (puree);Nectar-thick liquid Liquids provided via: Teaspoon;Cup Medication Administration: Crushed with puree Supervision: Staff to assist with self feeding;Full supervision/cueing for compensatory strategies Compensations: Slow rate;Small sips/bites;Check for pocketing;Check for anterior loss Postural Changes and/or Swallow Maneuvers: Seated upright 90 degrees              Oral Care Recommendations: Oral care Q4 per protocol Follow up Recommendations: Skilled Nursing facility Plan: Continue with current plan of care;MBS (MBS when able to cooperate)    GO      Quinn Axe T 04/14/2014, 1:04 PM

## 2014-04-14 NOTE — Clinical Social Work Note (Signed)
Per MD patient's blood pressure not well controlled, patient will not DC today. Husband, brother, and facility updated.   Liz Beach MSW, [place], [place], [phone]

## 2014-04-14 NOTE — Clinical Social Work Note (Signed)
Patient has a bed at [facility] if she can DC today. FL2 on chart. Husband and brother agreeable to [facility].   Liz Beach MSW, [place], [place], [phone]

## 2014-04-14 NOTE — Progress Notes (Signed)
PT HAS A DIFFICULT TIME SWALLOWING PUREE/FLDS. CRUSHED MEDS TODAY AND PUT IN SMALL BITES OF  APPLESAUCE. PT POCKET  FOODS PUT IN MOUTH NEEDS MUCH ENCOURGEMENT TO SWALLOW.

## 2014-04-14 NOTE — Progress Notes (Signed)
[facility] PROGRESS NOTE  Lisa [name] DEY:[id number] DOB: 29-Dec-1934 DOA: 04/06/2014 PCP: [name], MD  Brief Narrative: 79 year old female admitted 10/26 with fever and back pain and perinephric stranding suspicious for pyelonephritis seen on CT. Patient started on IV Rocephin initially. Patient white count improving although still having low-grade temperature. Urine cultures with no growth, but blood cultures times multiple sets grew out gram-positive cocci in chains which by 10/29 positive for Streptococcus viridans. Discussed with infectious disease who increased dose of Rocephin to 2 g daily. Cardiology consulted for TEE.  Physical therapy on 10/29 concerned about right extremity weakness,. Nursing also reported some confusion. CT scan of the head done was unrevealing. Neurology consult at felt the patient's picture perhaps resembled more likely encephalopathy. TEE unrevealing.   Pt has had poor oral intake and is currently awaiting placement into SNF  Assessment/Plan:  Principal Problem:   Streptococcus viridans infection - infectious disease on board and assisting with management - patient to receive ceftriaxone for 2 weeks total with 2 g IV administered daily. Last dose to be administered on November 11. - husband recently had stroke and patient will have limited help at home. As such husband and I think it is probably a better idea to transition patient to skilled nursing facility on discharge. - Picc line placed given recommendations from ID specialist  Active Problems:  Acute on chronic diastolic heart failure - patient on IV Lasix and according to weight has lost 4 pounds of fluid. Currently she is breathing comfortably on room air. No increased work of breathing. - Continue oral Lasix regimen  - Condition currently improving.    Hypertension -Cardizem - Hydralazine - Not well controlled. We'll continue to monitor blood pressures persistently elevated we'll  consider placing on amlodipine.    COPD (chronic obstructive pulmonary disease) - stable, will continue albuterol    UTI (lower urinary tract infection) - currently on Rocephin - UC reporting no growth    ARF (acute renal failure) - Steady near 1.0  - Will continue home lasix regimen next am.    Leukocytosis - Most likely 2ary to strep viridans bacteremia     Moderate protein-calorie malnutrition Pt meets criteria for moderate MALNUTRITION in the context of chronic illness as evidenced by <75% of estimated energy intake x 1 month, mild fat and muscle depletion. - RD on board - Will obtain speech therapy evaluation - continue ensure supplementation.  Code Status: full Family Communication: None Disposition Plan: SNF once medically ready for d/c. Probably within the next 1-2 days   Consultants:  ID  Neurology  Procedures:  Pt has had thoracic MRI with no evidence of infection or fx reported.  Antibiotics:  As listed above  HPI/Subjective: No acute issues reported overnight.  Objective: Filed Vitals:   04/14/14 1509  BP: 158/59  Pulse:   Temp: 97.6 F (36.4 C)  Resp:     Intake/Output Summary (Last 24 hours) at 04/14/14 1841 Last data filed at 04/14/14 1526  Gross per 24 hour  Intake    100 ml  Output    650 ml  Net   -550 ml   Filed Weights   04/12/14 0442 04/13/14 0500 04/14/14 0707  Weight: 56.2 kg (123 lb 14.4 oz) 54.3 kg (119 lb 11.4 oz) 52.6 kg (115 lb 15.4 oz)    Exam:   General:  Pt in nad, alert and awake  Cardiovascular: rrr, no mrg  Respiratory: cta bl, no wheezes  Abdomen: soft, NT, ND  Musculoskeletal: no cyanosis or clubbing   Data Reviewed: Basic Metabolic Panel:  Recent Labs Lab 04/08/14 0612 04/09/14 0530 04/11/14 0625 04/12/14 0544 04/13/14 0456  NA 141 144 141 138 139  K 4.5 5.5* 4.0 3.8 4.1  CL 109 112 104 102 100  CO2 20 18* 21 23 27   GLUCOSE 104* 92 91 90 97  BUN 38* 27* 22 30* 29*  CREATININE 1.10 0.98  0.92 1.22* 1.17*  CALCIUM 8.6 8.9 8.9 8.5 9.3   Liver Function Tests: No results for input(s): AST, ALT, ALKPHOS, BILITOT, PROT, ALBUMIN in the last 168 hours. No results for input(s): LIPASE, AMYLASE in the last 168 hours.  Recent Labs Lab 04/09/14 1505  AMMONIA 30   CBC:  Recent Labs Lab 04/08/14 0612 04/09/14 0530 04/11/14 0625 04/12/14 0544  WBC 11.0* 11.2* 13.4* 11.0*  NEUTROABS 8.5*  --   --   --   HGB 11.1* 10.8* 11.3* 10.0*  HCT 33.5* 33.1* 34.0* 30.1*  MCV 90.1 89.9 87.2 87.5  PLT 183 196 271 259   Cardiac Enzymes: No results for input(s): CKTOTAL, CKMB, CKMBINDEX, TROPONINI in the last 168 hours. BNP (last 3 results)  Recent Labs  04/09/14 1947 04/12/14 0830  PROBNP 5197.0* 1678.0*   CBG: No results for input(s): GLUCAP in the last 168 hours.  Recent Results (from the past 240 hour(s))  Blood culture (routine x 2)     Status: None   Collection Time: 04/06/14  5:55 PM  Result Value Ref Range Status   Specimen Description BLOOD RIGHT FOREARM  Final   Special Requests BOTTLES DRAWN AEROBIC ONLY 2CC  Final   Culture  Setup Time   Final    04/07/2014 01:06 Performed at [facility]   Culture   Final    VIRIDANS STREPTOCOCCUS Note: Gram Stain Report Called to,Read Back By and Verified With: Amaryllis Dyke RN 900P Performed at [facility]   Report Status 04/09/2014 FINAL  Final   Organism ID, Bacteria VIRIDANS STREPTOCOCCUS  Final      Susceptibility   Viridans streptococcus - MIC (ETEST)*    CEFTRIAXONE 1.0 SENSITIVE Sensitive     LEVOFLOXACIN 1.0 SENSITIVE Sensitive     PENICILLIN 1.0 INTERMEDIATE Intermediate     * VIRIDANS STREPTOCOCCUS  Blood culture (routine x 2)     Status: None   Collection Time: 04/06/14  6:02 PM  Result Value Ref Range Status   Specimen Description BLOOD RIGHT ANTECUBITAL  Final   Special Requests BOTTLES DRAWN AEROBIC AND ANAEROBIC 5CC  Final   Culture  Setup Time   Final    04/07/2014 01:06 Performed  at [facility]   Culture   Final    VIRIDANS STREPTOCOCCUS Note: SUSCEPTIBILITIES PERFORMED ON PREVIOUS CULTURE WITHIN THE LAST 5 DAYS. Note: Gram Stain Report Called to,Read Back By and Verified With: Joslyn Hy RN [phone] Performed at [facility]   Report Status 04/09/2014 FINAL  Final  Urine culture     Status: None   Collection Time: 04/06/14  6:20 PM  Result Value Ref Range Status   Specimen Description URINE, CATHETERIZED  Final   Special Requests NONE  Final   Culture  Setup Time   Final    04/06/2014 18:55 Performed at [facility] Performed at [facility]  Final   Culture NO GROWTH Performed at [facility]  Final   Report Status 04/07/2014 FINAL  Final  Culture, blood (  routine x 2)     Status: None (Preliminary result)   Collection Time: 04/09/14 12:30 PM  Result Value Ref Range Status   Specimen Description BLOOD LEFT ARM  Final   Special Requests BOTTLES DRAWN AEROBIC ONLY [facility]  Final   Culture  Setup Time   Final    04/09/2014 17:55 Performed at [facility]   Culture   Final           BLOOD CULTURE RECEIVED NO GROWTH TO DATE CULTURE WILL BE HELD FOR 5 DAYS BEFORE ISSUING A FINAL NEGATIVE REPORT Performed at [facility]   Report Status PENDING  Incomplete  Culture, blood (routine x 2)     Status: None (Preliminary result)   Collection Time: 04/09/14 12:50 PM  Result Value Ref Range Status   Specimen Description BLOOD LEFT HAND  Final   Special Requests BOTTLES DRAWN AEROBIC ONLY [place]  Final   Culture  Setup Time   Final    04/09/2014 17:55 Performed at [facility]   Culture   Final           BLOOD CULTURE RECEIVED NO GROWTH TO DATE CULTURE WILL BE HELD FOR 5 DAYS BEFORE ISSUING A FINAL NEGATIVE REPORT Performed at [facility]   Report Status PENDING  Incomplete     Studies: No results found.  Scheduled Meds: . cefTRIAXone (ROCEPHIN)  IV  2 g Intravenous  Q24H  . diazepam  5 mg Oral TID  . diltiazem  120 mg Oral Daily  . enoxaparin (LOVENOX) injection  40 mg Subcutaneous Q24H  . feeding supplement (ENSURE COMPLETE)  237 mL Oral BID BM  . furosemide  40 mg Oral Daily  . labetalol  200 mg Oral BID   Continuous Infusions: . dextrose 5 % and 0.45 % NaCl with KCl 20 mEq/L 75 mL/hr at 04/14/14 1819     Time spent: > 35 minutes   Velvet Bathe  [facility] Pager [phone]. If 7PM-7AM, please contact night-coverage at [email or web address], password [facility] 04/14/2014, 6:41 PM  LOS: 8 days

## 2014-04-14 NOTE — Progress Notes (Signed)
Peripherally Inserted Central Catheter/Midline Placement  The IV Nurse has discussed with the patient and/or persons authorized to consent for the patient, the purpose of this procedure and the potential benefits and risks involved with this procedure.  The benefits include less needle sticks, lab draws from the catheter and patient may be discharged home with the catheter.  Risks include, but not limited to, infection, bleeding, blood clot (thrombus formation), and puncture of an artery; nerve damage and irregular heat beat.  Alternatives to this procedure were also discussed.  PICC/Midline Placement Documentation     Phone consent from husband   Murvin Natal 04/14/2014, 10:43 AM

## 2014-04-15 DIAGNOSIS — Z8639 Personal history of other endocrine, nutritional and metabolic disease: Secondary | ICD-10-CM

## 2014-04-15 DIAGNOSIS — N179 Acute kidney failure, unspecified: Secondary | ICD-10-CM | POA: Insufficient documentation

## 2014-04-15 DIAGNOSIS — J449 Chronic obstructive pulmonary disease, unspecified: Secondary | ICD-10-CM | POA: Insufficient documentation

## 2014-04-15 DIAGNOSIS — A491 Streptococcal infection, unspecified site: Secondary | ICD-10-CM | POA: Insufficient documentation

## 2014-04-15 DIAGNOSIS — R7881 Bacteremia: Secondary | ICD-10-CM

## 2014-04-15 DIAGNOSIS — E440 Moderate protein-calorie malnutrition: Secondary | ICD-10-CM

## 2014-04-15 LAB — CULTURE, BLOOD (ROUTINE X 2)
CULTURE: NO GROWTH
Culture: NO GROWTH

## 2014-04-15 LAB — BASIC METABOLIC PANEL
Anion gap: 11 (ref 5–15)
BUN: 21 mg/dL (ref 6–23)
CALCIUM: 9.2 mg/dL (ref 8.4–10.5)
CO2: 29 mEq/L (ref 19–32)
Chloride: 99 mEq/L (ref 96–112)
Creatinine, Ser: 0.84 mg/dL (ref 0.50–1.10)
GFR calc Af Amer: 76 mL/min — ABNORMAL LOW (ref >90)
GFR, EST NON AFRICAN AMERICAN: 65 mL/min — AB (ref >90)
Glucose, Bld: 125 mg/dL — ABNORMAL HIGH (ref 70–99)
Potassium: 3.9 mEq/L (ref 3.7–5.3)
Sodium: 139 mEq/L (ref 137–147)

## 2014-04-15 MED ORDER — HEPARIN SOD (PORK) LOCK FLUSH 100 UNIT/ML IV SOLN
250.0000 [IU] | INTRAVENOUS | Status: AC | PRN
  Administered 2014-04-15: 250 [IU]

## 2014-04-15 MED ORDER — STARCH (THICKENING) PO POWD: ORAL | Status: DC

## 2014-04-15 MED ORDER — ENSURE COMPLETE PO LIQD: 237.0000 mL | Freq: Two times a day (BID) | ORAL | Status: DC

## 2014-04-15 MED ORDER — DIAZEPAM 5 MG PO TABS: 5.0000 mg | ORAL_TABLET | Freq: Three times a day (TID) | ORAL | Status: DC

## 2014-04-15 MED ORDER — TRAMADOL HCL 50 MG PO TABS: 50.0000 mg | ORAL_TABLET | Freq: Four times a day (QID) | ORAL | Status: DC | PRN

## 2014-04-15 MED ORDER — DEXTROSE 5 % IV SOLN: 2.0000 g | INTRAVENOUS | Status: AC

## 2014-04-15 NOTE — Discharge Summary (Addendum)
Physician Discharge Summary  Lisa [name] WUJ:[id number] DOB: Sep 18, 1934 DOA: 04/06/2014  PCP: [name], MD  Admit date: 04/06/2014 Discharge date: 04/15/2014  Time spent: 45 minutes  Recommendations for Outpatient Follow-up:  Patient will be discharged to [facility] facility. She should continue physical therapy as well as occupational therapy as recommended by the facility. Patient will also need to continue her medications as prescribed. Patient will need to be ceftriaxone per 04/22/2014. She will need a follow-up primary care physician within 1-2 weeks of discharge. Patient to continue a dysphagia 1 diet with nectar thick liquids.  Discharge Diagnoses:  Principal Problem:   Streptococcus viridans infection Active Problems:   Hypertension   COPD (chronic obstructive pulmonary disease)   Fever   UTI (lower urinary tract infection)   ARF (acute renal failure)   Pyelonephritis, acute   Leukocytosis   Dehydration   Acute encephalopathy   H/O goiter   Moderate protein-calorie malnutrition   Acute renal failure syndrome   Bacteremia   COLD (chronic obstructive lung disease)   Viridans streptococci infection   Discharge Condition: stable  Diet recommendation: dysphagia 1, nectar thick  Filed Weights   04/13/14 0500 04/14/14 0707 04/15/14 0517  Weight: 54.3 kg (119 lb 11.4 oz) 52.6 kg (115 lb 15.4 oz) 53 kg (116 lb 13.5 oz)    History of present illness:  On 04/06/2014 79 yo female with several days of rt flank pain worsening now with fever. Also with suprapubic pain, does not feel well. No n/v/d. Some dyruria. Not on abx frequently. Has uti/pyelo. Was initially tachycardic in the ED and febrile, this has improved with ivf and pain control.  Hospital Course:  Sepsis secondary to streptococcus viridans infection -Infectious disease consultation appreciated, recommended ceftriaxone 2 g IV daily through November 11 -PICC line was placed -patient's social  situation includes husband who recently had a stroke therefore patient will go to nursing home  Acute on chronic diastolic heart failure -Patient initially placed on IV Lasix and according to weight has lost 4 pounds of fluid.  -breathing appears to be at baseline on room air -Continue oral Lasix -continue to monitor daily weights, intake and output  Hypertension -continue Cardizem and hydralazine -will need to follow-up with her primary care physician for titration of blood pressure medications  COPD (chronic obstructive pulmonary disease) -stable, continue albuterol  UTI (lower urinary tract infection) -continue Rocephin -urine culture shows no growth  ARF (acute renal failure) -resolved, creatinine currently 0.84   Leukocytosis -likely secondary to strep viridans bacteremia  Moderate protein-calorie malnutrition -meets criteria for moderate malnutrition of the context of chronic illness as evidenced by less than 0.75% of estimated energy intake 1 month, mild fat and muscle depletion -Dietary onboard -Speech therapy consult and recommended dysphagia 1 nectar thick liquid -continue nutrition supplements  Procedures: Thoracic MRI-no evidence of infection or fracture reported PICC line placement  Consultations: Infectious disease Neurology  Discharge Exam: Filed Vitals:   04/15/14 0517  BP: 163/59  Pulse: 75  Temp: 98.2 F (36.8 C)  Resp: 27     General: Well developed, well nourished, NAD, appears stated age  HEENT: NCAT, mucous membranes moist.  Cardiovascular: S1 S2 auscultated,  Regular rate and rhythm.  Respiratory: Clear to auscultation bilaterally with equal chest rise  Abdomen: Soft, nontender, nondistended, + bowel sounds  Extremities: warm dry without cyanosis clubbing or edema  Neuro: Awake and alert, no focal deficits  Discharge Instructions      Discharge Instructions  Discharge instructions    Complete by:  As directed   Patient  will be discharged to [facility] facility. She should continue physical therapy as well as occupational therapy as recommended by the facility. Patient will also need to continue her medications as prescribed. Patient will need to be ceftriaxone per 04/22/2014. She will need a follow-up primary care physician within 1-2 weeks of discharge. Patient to continue a dysphagia 1 diet with nectar thick liquids.            Medication List    STOP taking these medications        ibuprofen 400 MG tablet  Commonly known as:  ADVIL,MOTRIN     losartan-hydrochlorothiazide 100-25 MG per tablet  Commonly known as:  HYZAAR      TAKE these medications        CALCIUM PO  Take 1 tablet by mouth daily.     cefTRIAXone 2 g in dextrose 5 % 50 mL  Inject 2 g into the vein daily.     CENTRUM ADULTS PO  Take 1 tablet by mouth daily.     diazepam 5 MG tablet  Commonly known as:  VALIUM  Take 1 tablet (5 mg total) by mouth 3 (three) times daily.     diltiazem 120 MG 24 hr capsule  Commonly known as:  CARDIZEM CD  Take 120 mg by mouth daily.     feeding supplement (ENSURE COMPLETE) Liqd  Take 237 mLs by mouth 2 (two) times daily between meals.     food thickener Powd  Commonly known as:  THICK IT  Use as needed with meals     furosemide 40 MG tablet  Commonly known as:  LASIX  Take 40 mg by mouth daily as needed for fluid.     ipratropium-albuterol 0.5-2.5 (3) MG/3ML Soln  Commonly known as:  DUONEB  Take 3 mLs by nebulization every 6 (six) hours as needed. For shortness of breath     COMBIVENT 18-103 MCG/ACT inhaler  Generic drug:  albuterol-ipratropium  Inhale 2 puffs into the lungs 4 (four) times daily.     labetalol 300 MG tablet  Commonly known as:  NORMODYNE  Take 300 mg by mouth 2 (two) times daily.     traMADol 50 MG tablet  Commonly known as:  ULTRAM  Take 1 tablet (50 mg total) by mouth every 6 (six) hours as needed for moderate pain.     VITAMIN-B COMPLEX PO  Take 1  tablet by mouth daily.       No Known Allergies Follow-up Information    Follow up with [facility], MD. Call in 1 week.   Specialty:  Internal Medicine   Why:  Hospital followup   Contact information:   [place] [address] [phone]        The results of significant diagnostics from this hospitalization (including imaging, microbiology, ancillary and laboratory) are listed below for reference.    Significant Diagnostic Studies: Dg Lumbar Spine Complete  04/02/2014   CLINICAL DATA:  Injury to the lower back yesterday with low back pain radiating to the right lower extremity.  EXAM: LUMBAR SPINE - COMPLETE 4+ VIEW  COMPARISON:  None.  FINDINGS: There is no evidence of lumbar spine fracture. Alignment is normal. There are degenerative joint changes with narrowed joint space and osteophyte formation more prominently in the lower lumbar spine.  IMPRESSION: No acute fracture or dislocation. Degenerative joint changes of the lumbar spine.  Electronically Signed   By: Abelardo Diesel M.D.   On: 04/02/2014 18:19   Ct Head Wo Contrast  04/09/2014   CLINICAL DATA:  79 year old hypertensive female with dizziness and arm weakness. Initial encounter.  EXAM: CT HEAD WITHOUT CONTRAST  TECHNIQUE: Contiguous axial images were obtained from the base of the skull through the vertex without intravenous contrast.  COMPARISON:  07/21/2013 MR.  No comparison CT  FINDINGS: Exam is motion degraded.  No intracranial hemorrhage.  Small vessel disease type changes without CT evidence of large acute infarct. If infarct is of high clinical concern (particularly posterior fossa infarct) MR may be considered.  No hydrocephalus  No intracranial mass detected on this unenhanced exam.  Vascular calcifications.  Mild mucosal thickening right maxillary sinus.  Mild exophthalmos.  IMPRESSION: Exam is motion degraded.  No intracranial hemorrhage.  Small vessel disease type changes without CT  evidence of large acute infarct. If infarct is of high clinical concern (particularly posterior fossa infarct) MR may be considered.   Electronically Signed   By: Chauncey Cruel M.D.   On: 04/09/2014 14:27   Mr Thoracic Spine Wo Contrast  04/11/2014   CLINICAL DATA:  Streptococcal infection.  Back pain 3 weeks.  EXAM: MRI THORACIC SPINE WITHOUT CONTRAST  TECHNIQUE: Multiplanar, multisequence MR imaging of the thoracic spine was performed. No intravenous contrast was administered.  COMPARISON:  None.  FINDINGS: The patient was in pain and could not hold still. Intravenous contrast not administered.  Negative for fracture or mass. No bone marrow edema. No evidence of discitis. No cord compression. Spinal cord signal is normal. Spinal canal adequate in size.  Mild disc degeneration at T11-T12. Mild disc degeneration at T5-6. Otherwise no significant degenerative change.  IMPRESSION: Exam quality is limited by motion.  No acute abnormality.  No evidence of fracture, mass, or infection.   Electronically Signed   By: Franchot Gallo M.D.   On: 04/11/2014 17:25   US Renal  04/07/2014   CLINICAL DATA:  Flank pain, right side.  EXAM: RENAL/URINARY TRACT ULTRASOUND COMPLETE  COMPARISON:  12/31/2008 CT  FINDINGS: Right Kidney:  Length: 11.2 cm. Echogenicity within normal limits. No mass or hydronephrosis visualized. Trace perinephric fluid.  Left Kidney:  Length: 11.2 cm. Echogenicity within normal limits. No mass or hydronephrosis visualized. Trace perinephric fluid.  Bladder:  Appears normal for degree of bladder distention.  Other:  Gallstones incidentally noted.  Trace perisplenic fluid.  IMPRESSION: No hydronephrosis or focal renal lesion.  Trace perinephric fluid can be incidental, seen with medical renal disease, or seen with ascending infection. Correlate with creatinine and urinalysis.  Cholelithiasis. Correlate with right upper quadrant ultrasound if concerned for acute biliary pathology.  Trace ascites.    Electronically Signed   By: Carlos Levering M.D.   On: 04/07/2014 03:11   Dg Chest Portable 1 View  04/06/2014   CLINICAL DATA:  Fever, cough.  EXAM: PORTABLE CHEST - 1 VIEW  COMPARISON:  October 08, 2012.  FINDINGS: The heart size and mediastinal contours are within normal limits. Both lungs are clear. No pneumothorax or pleural effusion is noted. The visualized skeletal structures are unremarkable.  IMPRESSION: No acute cardiopulmonary abnormality seen.   Electronically Signed   By: Sabino Dick M.D.   On: 04/06/2014 19:16    Microbiology: Recent Results (from the past 240 hour(s))  Blood culture (routine x 2)     Status: None   Collection Time: 04/06/14  5:55 PM  Result Value Ref Range Status  Specimen Description BLOOD RIGHT FOREARM  Final   Special Requests BOTTLES DRAWN AEROBIC ONLY 2CC  Final   Culture  Setup Time   Final    04/07/2014 01:06 Performed at [facility]   Culture   Final    VIRIDANS STREPTOCOCCUS Note: Gram Stain Report Called to,Read Back By and Verified With: Amaryllis Dyke RN 900P Performed at [facility]   Report Status 04/09/2014 FINAL  Final   Organism ID, Bacteria VIRIDANS STREPTOCOCCUS  Final      Susceptibility   Viridans streptococcus - MIC (ETEST)*    CEFTRIAXONE 1.0 SENSITIVE Sensitive     LEVOFLOXACIN 1.0 SENSITIVE Sensitive     PENICILLIN 1.0 INTERMEDIATE Intermediate     * VIRIDANS STREPTOCOCCUS  Blood culture (routine x 2)     Status: None   Collection Time: 04/06/14  6:02 PM  Result Value Ref Range Status   Specimen Description BLOOD RIGHT ANTECUBITAL  Final   Special Requests BOTTLES DRAWN AEROBIC AND ANAEROBIC 5CC  Final   Culture  Setup Time   Final    04/07/2014 01:06 Performed at [facility]   Culture   Final    VIRIDANS STREPTOCOCCUS Note: SUSCEPTIBILITIES PERFORMED ON PREVIOUS CULTURE WITHIN THE LAST 5 DAYS. Note: Gram Stain Report Called to,Read Back By and Verified With: Joslyn Hy RN  [phone] Performed at [facility]   Report Status 04/09/2014 FINAL  Final  Urine culture     Status: None   Collection Time: 04/06/14  6:20 PM  Result Value Ref Range Status   Specimen Description URINE, CATHETERIZED  Final   Special Requests NONE  Final   Culture  Setup Time   Final    04/06/2014 18:55 Performed at [place] Performed at [facility]  Final   Culture NO GROWTH Performed at [facility]  Final   Report Status 04/07/2014 FINAL  Final  Culture, blood (routine x 2)     Status: None   Collection Time: 04/09/14 12:30 PM  Result Value Ref Range Status   Specimen Description BLOOD LEFT ARM  Final   Special Requests BOTTLES DRAWN AEROBIC ONLY [facility]  Final   Culture  Setup Time   Final    04/09/2014 17:55 Performed at [facility]    Culture   Final    NO GROWTH 5 DAYS Performed at [facility]    Report Status 04/15/2014 FINAL  Final  Culture, blood (routine x 2)     Status: None   Collection Time: 04/09/14 12:50 PM  Result Value Ref Range Status   Specimen Description BLOOD LEFT HAND  Final   Special Requests BOTTLES DRAWN AEROBIC ONLY 8CC  Final   Culture  Setup Time   Final    04/09/2014 17:55 Performed at Center   Final    NO GROWTH 5 DAYS Performed at [facility]    Report Status 04/15/2014 FINAL  Final     Labs: Basic Metabolic Panel:  Recent Labs Lab 04/09/14 0530 04/11/14 0625 04/12/14 0544 04/13/14 0456 04/15/14 0030  NA 144 141 138 139 139  K 5.5* 4.0 3.8 4.1 3.9  CL 112 104 102 100 99  CO2 18* 21 23 27 29   GLUCOSE 92 91 90 97 125*  BUN 27* 22 30* 29* 21  CREATININE 0.98 0.92 1.22* 1.17* 0.84  CALCIUM 8.9 8.9 8.5 9.3 9.2   Liver  Function Tests: No results for input(s): AST, ALT, ALKPHOS, BILITOT, PROT, ALBUMIN in the last 168 hours. No results for input(s): LIPASE, AMYLASE in the last 168 hours.  Recent Labs Lab  04/09/14 1505  AMMONIA 30   CBC:  Recent Labs Lab 04/09/14 0530 04/11/14 0625 04/12/14 0544  WBC 11.2* 13.4* 11.0*  HGB 10.8* 11.3* 10.0*  HCT 33.1* 34.0* 30.1*  MCV 89.9 87.2 87.5  PLT 196 271 259   Cardiac Enzymes: No results for input(s): CKTOTAL, CKMB, CKMBINDEX, TROPONINI in the last 168 hours. BNP: BNP (last 3 results)  Recent Labs  04/09/14 1947 04/12/14 0830  PROBNP 5197.0* 1678.0*   CBG: No results for input(s): GLUCAP in the last 168 hours.     SignedCristal Ford  [facility] 04/15/2014, 9:56 AM

## 2014-04-15 NOTE — Progress Notes (Signed)
Called [facility] x3 and spoke with Cloyde Reams trying to get a nurse to give report to with no avail. I left my name and number  and ask to be called back when staff wanting report

## 2014-04-15 NOTE — Progress Notes (Signed)
PT DC BY AMBULANCE AT THIS TIME TO [facility] IN [place]. NO ONE FROM [facility] HAS CALLED FOR REPORT AT THIS TIME.

## 2014-04-15 NOTE — Clinical Social Work Placement (Signed)
Clinical Social Work Department CLINICAL SOCIAL WORK PLACEMENT NOTE 04/15/2014  Patient:  Lisa [name], Lisa [name]  Account Number:  [id number] Admit date:  04/06/2014  Clinical Social Worker:  Kemper Durie, [place]  Date/time:  04/15/2014 02:55 PM  Clinical Social Work is seeking post-discharge placement for this patient at the following level of care:   SKILLED NURSING   (*CSW will update this form in Epic as items are completed)   04/14/2014  Patient/family provided with [facility] Department of Clinical Social Work's list of facilities offering this level of care within the geographic area requested by the patient (or if unable, by the patient's family).  04/14/2014  Patient/family informed of their freedom to choose among providers that offer the needed level of care, that participate in Medicare, Medicaid or managed care program needed by the patient, have an available bed and are willing to accept the patient.  04/14/2014  Patient/family informed of [facility]' ownership interest in [facility], as well as of the fact that they are under no obligation to receive care at this facility.  PASARR submitted to EDS on 04/14/2014 PASARR number received on 04/14/2014  FL2 transmitted to all facilities in geographic area requested by pt/family on  04/14/2014 FL2 transmitted to all facilities within larger geographic area on   Patient informed that his/her managed care company has contracts with or will negotiate with  certain facilities, including the following:     Patient/family informed of bed offers received:  04/14/2014 Patient chooses bed at [facility] Physician recommends and patient chooses bed at    Patient to be transferred to [facility] on  04/15/2014 Patient to be transferred to facility by Ambulance Patient and family notified of transfer on 04/15/2014 Name of family member notified:  Juanda Crumble and Leane Para  The following physician request  were entered in Epic:   Additional Comments:   Per MD patient ready for DC to [facility]. RN, patient, patient's family, and facility notified of DC. RN given number for report. DC packet on chart. [facility] transport requested for patient. CSW signing off.    Liz Beach MSW, [place], [place], [phone]

## 2014-04-15 NOTE — Progress Notes (Signed)
Speech Language Pathology Treatment: Dysphagia  Patient Details Name: Lisa [name] MRN: [id number] DOB: Sep 02, 1934 Today's Date: 04/15/2014 Time: 8882-8003 SLP Time Calculation (min): 10 min  Assessment / Plan / Recommendation Clinical Impression  Patient was seen with Dys 1 textures and nectar thick liquids by spoon. Patient continues to have difficulties with initiation, requiring Max cues for oral holding and to trigger swallow. Today patient was responding to cues with a delayed response, and no coughing was observed. Her intake remains little during PO trials, making adequate PO nutrition a concern. SLP provided Max A to alternate solids and liquids to facilitate clearance of oral residuals. Recommend to continue cautiously with Dys 1 textures and nectar thick liquids by spoon/cup.    HPI HPI: 79 year old female admitted 10/26 with fever and back pain and perinephric stranding suspicious for pyelonephritis seen on CT. CXR without acute disease. PMH significant for COPD.   Pertinent Vitals    SLP Plan  Continue with current plan of care;MBS (MBS when able to participate)    Recommendations Diet recommendations: Dysphagia 1 (puree);Nectar-thick liquid Liquids provided via: Teaspoon;Cup Medication Administration: Crushed with puree Supervision: Staff to assist with self feeding;Full supervision/cueing for compensatory strategies Compensations: Slow rate;Small sips/bites;Check for pocketing;Check for anterior loss;Follow solids with liquid Postural Changes and/or Swallow Maneuvers: Seated upright 90 degrees              Oral Care Recommendations: Oral care Q4 per protocol;Oral care before and after PO Follow up Recommendations: Skilled Nursing facility Plan: Continue with current plan of care;MBS (MBS when able to participate)    GO      Germain Osgood, M.A. CCC-SLP [phone]  Germain Osgood 04/15/2014, 11:07 AM

## 2014-04-15 NOTE — Progress Notes (Signed)
Physical Therapy Treatment Patient Details Name: Lisa [name] MRN: [id number] DOB: November 28, 1934 Today's Date: 04/15/2014    History of Present Illness 79 yo female with several days of rt flank pain worsening now with fever.  Also with suprapubic pain, does not feel well.Has UTI, pyelonephritis    PT Comments    Pt making slow progress, limited by back pain and kidney injury.  Follow Up Recommendations  SNF;Supervision/Assistance - 24 hour     Equipment Recommendations  Other (comment) (TBA)    Recommendations for Other Services       Precautions / Restrictions Precautions Precautions: Fall    Mobility  Bed Mobility Overal bed mobility: Needs Assistance Bed Mobility: Rolling;Sidelying to Sit;Sit to Sidelying Rolling: Max assist Sidelying to sit: Max assist     Sit to sidelying: Max assist General bed mobility comments: cued step by step through bed mobility and significant assist  Transfers                    Ambulation/Gait                 Stairs            Wheelchair Mobility    Modified Rankin (Stroke Patients Only)       Balance Overall balance assessment: Needs assistance Sitting-balance support: Bilateral upper extremity supported;Single extremity supported Sitting balance-Leahy Scale: Poor Sitting balance - Comments: sat EOB for ~8 minutes working on truncal activation and sitting balance while getting pt awake enough to take her pills                            Cognition Arousal/Alertness: Lethargic Behavior During Therapy: Flat affect Overall Cognitive Status: No family/caregiver present to determine baseline cognitive functioning     Current Attention Level: Sustained Memory: Decreased short-term memory Following Commands: Follows one step commands inconsistently;Follows one step commands with increased time Safety/Judgement: Decreased awareness of deficits   Problem Solving: Slow processing;Decreased  initiation;Difficulty sequencing;Requires verbal cues;Requires tactile cues      Exercises      General Comments        Pertinent Vitals/Pain Pain Assessment: Faces Faces Pain Scale: Hurts whole lot Pain Location: back Pain Intervention(s): Limited activity within patient's tolerance    Home Living                      Prior Function            PT Goals (current goals can now be found in the care plan section) Acute Rehab PT Goals PT Goal Formulation: Patient unable to participate in goal setting Time For Goal Achievement: 04/23/14 Potential to Achieve Goals: Fair Progress towards PT goals: Not progressing toward goals - comment    Frequency  Min 3X/week    PT Plan Current plan remains appropriate    Co-evaluation             End of Session   Activity Tolerance: Patient limited by lethargy;Patient tolerated treatment well Patient left: in bed;with call bell/phone within reach     Time: 9024-0973 PT Time Calculation (min): 16 min  Charges:  $Therapeutic Activity: 8-22 mins                    G Codes:      Kalyani Maeda, Tessie Fass 04/15/2014, 5:39 PM 04/15/2014  Donnella Sham, PT [phone]  (pager)

## 2014-04-15 NOTE — Clinical Social Work Note (Signed)
Ambulance transport requested for 3:30pm (service request ID: [id number]). Patient's husband and brother informed.   Liz Beach MSW, [place], [place], [phone]

## 2014-04-15 NOTE — Discharge Instructions (Signed)
Bacteremia Bacteremia occurs when bacteria get in your blood. Normal blood does not usually have bacteria. Bacteremia is one way infections can spread from one part of the body to another. CAUSES   Causes may include anything that allows bacteria to get into the body. Examples are:  Catheters.  Intravenous (IV) access tubes.  Cuts or scrapes of the skin.  Temporary bacteremia may occur during dental procedures, while brushing your teeth, or during a bowel movement. This rarely causes any symptoms or medical problems.  Bacteria may also get in the bloodstream as a complication of a bacterial infection elsewhere. This includes infected wounds and bacterial infections of the:  Lungs (pneumonia).  Kidneys (pyelonephritis).  Intestines (enteritis, colitis).  Organs in the abdomen (appendicitis, cholecystitis, diverticulitis). SYMPTOMS  The body is usually able to clear small numbers of bacteria out of the blood quickly. Brief bacteremia usually does not cause problems.   Problems can occur if the bacteria start to grow in number or spread to other parts of the body. If the bacteria start growing, you may develop:  Chills.  Fever.  Nausea.  Vomiting.  Sweating.  Lightheadedness and low blood pressure.  Pain.  If bacteria start to grow in the linings around the brain, it is called meningitis. This can cause severe headaches, many other problems, and even death.  If bacteria start to grow in a joint, it causes arthritis with painful joints. If bacteria start to grow in a bone, it is called osteomyelitis.  Bacteria from the blood can also cause sores (abscesses) in many organs, such as the muscle, liver, spleen, lungs, brain, and kidneys. DIAGNOSIS   This condition is diagnosed by cultures of the blood.  Cultures may also be taken from other parts of the body that are thought to be causing the bacteremia. A small piece of tissue, fluid, or other product of the body is  sampled. The sample is then put on a growth plate to see if any bacteria grows.  Other lab tests may be done and the results may be abnormal. TREATMENT  Treatment requires a stay in the hospital. You will be given antibiotic medicine through an IV access tube. PREVENTION  People with an increased risk of developing bacteremia or complications may be given antibiotics before certain procedures. Examples are:  A person with a heart murmur or artificial heart valve, before having his or her teeth cleaned.  Before having a surgical or other invasive procedure.  Before having a bowel procedure. Document Released: 03/12/2006 Document Revised: 08/21/2011 Document Reviewed: 12/22/2010 [facility] Patient Information 2015 [place], [place]. This information is not intended to replace advice given to you by your health care provider. Make sure you discuss any questions you have with your health care provider.

## 2014-04-17 ENCOUNTER — Encounter (HOSPITAL_COMMUNITY): Payer: Self-pay

## 2014-04-17 ENCOUNTER — Emergency Department (HOSPITAL_COMMUNITY)
Admission: EM | Admit: 2014-04-17 | Discharge: 2014-04-18 | Disposition: A | Discharge status: Home / Self Care | Payer: Medicare Other | Attending: Emergency Medicine | Admitting: Emergency Medicine

## 2014-04-17 DIAGNOSIS — I10 Essential (primary) hypertension: Secondary | ICD-10-CM | POA: Insufficient documentation

## 2014-04-17 DIAGNOSIS — Z720 Tobacco use: Secondary | ICD-10-CM | POA: Insufficient documentation

## 2014-04-17 DIAGNOSIS — R0989 Other specified symptoms and signs involving the circulatory and respiratory systems: Secondary | ICD-10-CM | POA: Insufficient documentation

## 2014-04-17 DIAGNOSIS — R0600 Dyspnea, unspecified: Secondary | ICD-10-CM | POA: Insufficient documentation

## 2014-04-17 DIAGNOSIS — Z87448 Personal history of other diseases of urinary system: Secondary | ICD-10-CM | POA: Diagnosis not present

## 2014-04-17 DIAGNOSIS — Z79899 Other long term (current) drug therapy: Secondary | ICD-10-CM | POA: Insufficient documentation

## 2014-04-17 DIAGNOSIS — J441 Chronic obstructive pulmonary disease with (acute) exacerbation: Secondary | ICD-10-CM | POA: Insufficient documentation

## 2014-04-17 DIAGNOSIS — R0602 Shortness of breath: Secondary | ICD-10-CM | POA: Diagnosis present

## 2014-04-17 LAB — BLOOD GAS, ARTERIAL
ACID-BASE EXCESS: 4.7 mmol/L — AB (ref 0.0–2.0)
Bicarbonate: 29.1 mEq/L — ABNORMAL HIGH (ref 20.0–24.0)
Drawn by: 105551
Expiratory PAP: 5
FIO2: 0.4 %
Inspiratory PAP: 15
Mode: POSITIVE
O2 Saturation: 95 %
Patient temperature: 37
RATE: 15 resp/min
TCO2: 26.2 mmol/L (ref 0–100)
pCO2 arterial: 46.5 mmHg — ABNORMAL HIGH (ref 35.0–45.0)
pH, Arterial: 7.413 (ref 7.350–7.450)
pO2, Arterial: 81.6 mmHg (ref 80.0–100.0)

## 2014-04-17 LAB — CBC WITH DIFFERENTIAL/PLATELET
Basophils Absolute: 0 10*3/uL (ref 0.0–0.1)
Basophils Relative: 0 % (ref 0–1)
EOS PCT: 1 % (ref 0–5)
Eosinophils Absolute: 0.1 10*3/uL (ref 0.0–0.7)
HCT: 32.8 % — ABNORMAL LOW (ref 36.0–46.0)
Hemoglobin: 10.9 g/dL — ABNORMAL LOW (ref 12.0–15.0)
LYMPHS ABS: 1.6 10*3/uL (ref 0.7–4.0)
LYMPHS PCT: 22 % (ref 12–46)
MCH: 29.9 pg (ref 26.0–34.0)
MCHC: 33.2 g/dL (ref 30.0–36.0)
MCV: 89.9 fL (ref 78.0–100.0)
Monocytes Absolute: 0.4 10*3/uL (ref 0.1–1.0)
Monocytes Relative: 6 % (ref 3–12)
Neutro Abs: 5.4 10*3/uL (ref 1.7–7.7)
Neutrophils Relative %: 71 % (ref 43–77)
PLATELETS: 402 10*3/uL — AB (ref 150–400)
RBC: 3.65 MIL/uL — AB (ref 3.87–5.11)
RDW: 12.6 % (ref 11.5–15.5)
WBC: 7.5 10*3/uL (ref 4.0–10.5)

## 2014-04-17 NOTE — ED Notes (Signed)
Patient via emas from [facility] c/o SOB that started 30 PTA. Per staff patient may have aspirated. Patient has difficulty swallowing and is on thick liquids. Staff states O2 sats were in the 70's, they suctioned her and they increased to 80's. EMS placed patient on BiPap and O2 saturations increased into the 90's. Patient has PICC placement to right upper arm. Is responsive to verbal and painful stimuli.

## 2014-04-17 NOTE — ED Provider Notes (Addendum)
CSN: [id number]     Arrival date & time 04/17/14  2309 History  This chart was scribed for Wynetta Fines, MD by Tula Nakayama, ED Scribe. This patient was seen in room APA02/APA02 and the patient's care was started at 11:29 PM.    Chief Complaint  Patient presents with  . Shortness of Breath   HPI Comments: Level 5 Caveat: Altered Mental Status; on BiPAP  The history is provided by the EMS personnel. No language interpreter was used.     Level 5 Caveat: Altered Mental Status; on BiPAP  HPI Comments: Lisa [name] is a 79 y.o. female who was admitted on October 26 and discharged 2 days ago to [facility] skilled nursing facility presents to the Emergency Department complaining of shortness of breath that started 30 min PTA. She is on a thick liquid diet. She was given her medications in apple sauce 1 hour PTA. She acutely developed dyspnea which the staff suspected was due to aspiration. Her oxygen saturation fell into the 70's. She was suctioned and placed on oxygen with saturation improved into the 80's. EMS placed her on BiPAP with saturation improved to the 90's. She has an altered mental status which is consistent with her baseline.   Past Medical History  Diagnosis Date  . COPD (chronic obstructive pulmonary disease)   . Hypertension   . Pyelonephritis 04/06/2014    UTI   Past Surgical History  Procedure Laterality Date  . Abdominal hysterectomy    . Tee without cardioversion N/A 04/10/2014    Procedure: TRANSESOPHAGEAL ECHOCARDIOGRAM (TEE);  Surgeon: [name], MD;  Location: [facility] ENDOSCOPY;  Service: Cardiovascular;  Laterality: N/A;   Family History  Problem Relation Age of Onset  . Hypertension Mother   . Hypertension Father    History  Substance Use Topics  . Smoking status: Current Every Day Smoker -- 0.30 packs/day    Types: Cigarettes  . Smokeless tobacco: Never Used  . Alcohol Use: No   OB History    No data available     Review of Systems  10 Systems  reviewed and all are negative for acute change except as noted in the HPI.   Allergies  Review of patient's allergies indicates no known allergies.  Home Medications   Prior to Admission medications   Medication Sig Start Date End Date Taking? Authorizing Provider  albuterol-ipratropium (COMBIVENT) 18-103 MCG/ACT inhaler Inhale 2 puffs into the lungs 4 (four) times daily.    Historical Provider, MD  B Complex Vitamins (VITAMIN-B COMPLEX PO) Take 1 tablet by mouth daily.    Historical Provider, MD  CALCIUM PO Take 1 tablet by mouth daily.    Historical Provider, MD  cefTRIAXone 2 g in dextrose 5 % 50 mL Inject 2 g into the vein daily. 04/15/14 04/22/14  [name], DO  diazepam (VALIUM) 5 MG tablet Take 1 tablet (5 mg total) by mouth 3 (three) times daily. 04/15/14   [name], DO  diltiazem (CARDIZEM CD) 120 MG 24 hr capsule Take 120 mg by mouth daily.      Historical Provider, MD  feeding supplement, ENSURE COMPLETE, (ENSURE COMPLETE) LIQD Take 237 mLs by mouth 2 (two) times daily between meals. 04/15/14   [name], DO  food thickener (THICK IT) POWD Use as needed with meals 04/15/14   [name], DO  furosemide (LASIX) 40 MG tablet Take 40 mg by mouth daily as needed for fluid.     Historical Provider, MD  ipratropium-albuterol (DUONEB)  0.5-2.5 (3) MG/3ML SOLN Take 3 mLs by nebulization every 6 (six) hours as needed. For shortness of breath    Historical Provider, MD  labetalol (NORMODYNE) 300 MG tablet Take 300 mg by mouth 2 (two) times daily.      Historical Provider, MD  Multiple Vitamins-Minerals (CENTRUM ADULTS PO) Take 1 tablet by mouth daily.    Historical Provider, MD  traMADol (ULTRAM) 50 MG tablet Take 1 tablet (50 mg total) by mouth every 6 (six) hours as needed for moderate pain. 04/15/14   [name], DO   BP 133/72 mmHg  Pulse 54  Temp(Src) 98.3 F (36.8 C) (Axillary)  Resp 23  Ht 5\' 7"  (1.702 m)  Wt 120 lb (54.432 kg)  BMI 18.79 kg/m2  SpO2 100%    Physical Exam  Nursing note and vitals reviewed.   General: Well-developed, cachectic female in no acute distress; appearance consistent with age of record HENT: normocephalic; atraumatic Eyes: pupils equal, round and reactive to light; arcus senilus bilaterally; could not assess extraocular muscles  Neck: supple; goiter Heart: regular rate and rhythm Lungs: on BiPAP; distant sounds; tachypnea Abdomen: soft; nondistended; nontender; no masses or hepatosplenomegaly; bowel sounds present GU: Indwelling foley draining dark urine  Extremities: arthritic changes; no edema Neurologic: somnolent; moans to painful stimuli; limited ability to follow commands Skin: Warm and dry    ED Course  Procedures (including critical care time) DIAGNOSTIC STUDIES: Oxygen Saturation is 99% on RA, normal by my interpretation.     MDM   Nursing notes and vitals signs, including pulse oximetry, reviewed.  Summary of this visit's results, reviewed by myself:  Labs:  Results for orders placed or performed during the hospital encounter of 04/17/14 (from the past 24 hour(s))  CBC with Differential     Status: Abnormal   Collection Time: 04/17/14 11:20 PM  Result Value Ref Range   WBC 7.5 4.0 - 10.5 K/uL   RBC 3.65 (L) 3.87 - 5.11 MIL/uL   Hemoglobin 10.9 (L) 12.0 - 15.0 g/dL   HCT 32.8 (L) 36.0 - 46.0 %   MCV 89.9 78.0 - 100.0 fL   MCH 29.9 26.0 - 34.0 pg   MCHC 33.2 30.0 - 36.0 g/dL   RDW 12.6 11.5 - 15.5 %   Platelets 402 (H) 150 - 400 K/uL   Neutrophils Relative % 71 43 - 77 %   Neutro Abs 5.4 1.7 - 7.7 K/uL   Lymphocytes Relative 22 12 - 46 %   Lymphs Abs 1.6 0.7 - 4.0 K/uL   Monocytes Relative 6 3 - 12 %   Monocytes Absolute 0.4 0.1 - 1.0 K/uL   Eosinophils Relative 1 0 - 5 %   Eosinophils Absolute 0.1 0.0 - 0.7 K/uL   Basophils Relative 0 0 - 1 %   Basophils Absolute 0.0 0.0 - 0.1 K/uL  Basic metabolic panel     Status: Abnormal   Collection Time: 04/17/14 11:20 PM  Result Value  Ref Range   Sodium 139 137 - 147 mEq/L   Potassium 3.9 3.7 - 5.3 mEq/L   Chloride 98 96 - 112 mEq/L   CO2 29 19 - 32 mEq/L   Glucose, Bld 127 (H) 70 - 99 mg/dL   BUN 30 (H) 6 - 23 mg/dL   Creatinine, Ser 0.92 0.50 - 1.10 mg/dL   Calcium 9.6 8.4 - 10.5 mg/dL   GFR calc non Af Amer 59 (L) >90 mL/min   GFR calc Af Amer 68 (L) >90 mL/min  Anion gap 12 5 - 15  Blood gas, arterial     Status: Abnormal   Collection Time: 04/17/14 11:26 PM  Result Value Ref Range   FIO2 0.40 %   Delivery systems OXYGEN MASK    Mode BILEVEL POSITIVE AIRWAY PRESSURE    Rate 15 resp/min   Inspiratory PAP 15    Expiratory PAP 5.0    pH, Arterial 7.413 7.350 - 7.450   pCO2 arterial 46.5 (H) 35.0 - 45.0 mmHg   pO2, Arterial 81.6 80.0 - 100.0 mmHg   Bicarbonate 29.1 (H) 20.0 - 24.0 mEq/L   TCO2 26.2 0 - 100 mmol/L   Acid-Base Excess 4.7 (H) 0.0 - 2.0 mmol/L   O2 Saturation 95.0 %   Patient temperature 37.0    Collection site RADIAL    Drawn by [id number]    Sample type ARTERIAL    Allens test (pass/fail) PASS PASS    Imaging Studies: Dg Chest Port 1 View  04/18/2014   CLINICAL DATA:  Respiratory distress.  EXAM: PORTABLE CHEST - 1 VIEW  COMPARISON:  04/06/2014  FINDINGS: Interval placement of a right PICC catheter. Tip localizes about 2.5 vertebral bodies below the carina suggesting location in the cavoatrial junction. No pneumothorax. Normal heart size and pulmonary vascularity. No focal airspace disease or consolidation in the lungs. No blunting of costophrenic angles. No pneumothorax. Mediastinal contours appear intact.  IMPRESSION: PICC line tip appears to be localized at the cavoatrial junction. No evidence of active pulmonary disease.   Electronically Signed   By: Lucienne Capers M.D.   On: 04/18/2014 00:38   3:07 AM Patient stable off BiPAP. Oxygen saturation 100% on nasal cannula. She is in no respiratory distress. Her mental status is baseline per her family.        Wynetta Fines, MD 04/18/14  0300  Wynetta Fines, MD 04/18/14 [id number]  Wynetta Fines, MD 04/18/14 [id number]

## 2014-04-18 ENCOUNTER — Emergency Department (HOSPITAL_COMMUNITY): Payer: Medicare Other

## 2014-04-18 LAB — BASIC METABOLIC PANEL
Anion gap: 12 (ref 5–15)
BUN: 30 mg/dL — ABNORMAL HIGH (ref 6–23)
CHLORIDE: 98 meq/L (ref 96–112)
CO2: 29 meq/L (ref 19–32)
Calcium: 9.6 mg/dL (ref 8.4–10.5)
Creatinine, Ser: 0.92 mg/dL (ref 0.50–1.10)
GFR calc Af Amer: 68 mL/min — ABNORMAL LOW (ref >90)
GFR calc non Af Amer: 59 mL/min — ABNORMAL LOW (ref >90)
GLUCOSE: 127 mg/dL — AB (ref 70–99)
Potassium: 3.9 mEq/L (ref 3.7–5.3)
SODIUM: 139 meq/L (ref 137–147)

## 2014-04-18 NOTE — Progress Notes (Signed)
Patient taken off BIPAP and placed on 4 lpm nasal cannula per MD.

## 2014-04-18 NOTE — ED Notes (Signed)
Pt rotated off back onto right side. Pillows placed behind pt back for support.

## 2014-04-18 NOTE — ED Notes (Addendum)
Discharged instruction given to [facility], follow up reviewed. No concerns voiced.

## 2014-05-06 ENCOUNTER — Emergency Department (HOSPITAL_COMMUNITY): Payer: Medicare Other

## 2014-05-06 ENCOUNTER — Inpatient Hospital Stay (HOSPITAL_COMMUNITY)
Admission: EM | Admit: 2014-05-06 | Discharge: 2014-05-12 | DRG: 871 | Disposition: A | Discharge status: Skilled Nursing Facility | Payer: Medicare Other | Attending: Internal Medicine | Admitting: Internal Medicine

## 2014-05-06 ENCOUNTER — Encounter (HOSPITAL_COMMUNITY): Payer: Self-pay | Admitting: Emergency Medicine

## 2014-05-06 DIAGNOSIS — Z681 Body mass index (BMI) 19 or less, adult: Secondary | ICD-10-CM

## 2014-05-06 DIAGNOSIS — J449 Chronic obstructive pulmonary disease, unspecified: Secondary | ICD-10-CM | POA: Diagnosis present

## 2014-05-06 DIAGNOSIS — R410 Disorientation, unspecified: Secondary | ICD-10-CM

## 2014-05-06 DIAGNOSIS — M4646 Discitis, unspecified, lumbar region: Secondary | ICD-10-CM

## 2014-05-06 DIAGNOSIS — G9340 Encephalopathy, unspecified: Secondary | ICD-10-CM

## 2014-05-06 DIAGNOSIS — Z8249 Family history of ischemic heart disease and other diseases of the circulatory system: Secondary | ICD-10-CM

## 2014-05-06 DIAGNOSIS — N390 Urinary tract infection, site not specified: Secondary | ICD-10-CM | POA: Diagnosis present

## 2014-05-06 DIAGNOSIS — A419 Sepsis, unspecified organism: Secondary | ICD-10-CM | POA: Diagnosis not present

## 2014-05-06 DIAGNOSIS — E43 Unspecified severe protein-calorie malnutrition: Secondary | ICD-10-CM | POA: Diagnosis present

## 2014-05-06 DIAGNOSIS — F17210 Nicotine dependence, cigarettes, uncomplicated: Secondary | ICD-10-CM | POA: Diagnosis present

## 2014-05-06 DIAGNOSIS — I10 Essential (primary) hypertension: Secondary | ICD-10-CM | POA: Diagnosis present

## 2014-05-06 DIAGNOSIS — M549 Dorsalgia, unspecified: Secondary | ICD-10-CM

## 2014-05-06 DIAGNOSIS — A047 Enterocolitis due to Clostridium difficile: Secondary | ICD-10-CM | POA: Diagnosis present

## 2014-05-06 DIAGNOSIS — R531 Weakness: Secondary | ICD-10-CM | POA: Diagnosis not present

## 2014-05-06 NOTE — ED Notes (Signed)
Pt just released from [facility] yesterday and last night started complaining of lower back pain.

## 2014-05-06 NOTE — ED Provider Notes (Signed)
CSN: [id number]     Arrival date & time 05/06/14  2225 History   This chart was scribed for Lisa Clonts, MD by Rayfield Citizen, ED Scribe. This patient was seen in room APA07/APA07 and the patient's care was started at 11:31 PM.    Level 5; AMS  Chief Complaint  Patient presents with  . Back Pain   The history is provided by the patient and a relative. The history is limited by the condition of the patient. No language interpreter was used.     HPI Comments: Lisa [name] is a 79 y.o. female who presents to the Emergency Department complaining of 1 month of back pain and generalized weakness; according to family, she is "unable to walk". Family reports that patient was having back pain after a recent strain 1 month PTA; patient was seen at [facility] and was told that she had a "blood infection" (admitted for 2 weeks). She was then placed at [facility] (21 days) for rehab; patient was released yesterday. Family denies cough or new, concerning falls (patient regularly "falls out of bed").  Patient lives alone; she is checked on regularly by family.   Past Medical History  Diagnosis Date  . COPD (chronic obstructive pulmonary disease)   . Hypertension   . Pyelonephritis 04/06/2014    UTI   Past Surgical History  Procedure Laterality Date  . Abdominal hysterectomy    . Tee without cardioversion N/A 04/10/2014    Procedure: TRANSESOPHAGEAL ECHOCARDIOGRAM (TEE);  Surgeon: [name], MD;  Location: [facility] ENDOSCOPY;  Service: Cardiovascular;  Laterality: N/A;   Family History  Problem Relation Age of Onset  . Hypertension Mother   . Hypertension Father    History  Substance Use Topics  . Smoking status: Current Every Day Smoker -- 0.30 packs/day    Types: Cigarettes  . Smokeless tobacco: Never Used  . Alcohol Use: No   OB History    No data available     Review of Systems  Musculoskeletal: Positive for back pain.  Neurological: Positive for weakness.  Psychiatric/Behavioral:  Positive for confusion.  All other systems reviewed and are negative.   Allergies  Review of patient's allergies indicates no known allergies.  Home Medications   Prior to Admission medications   Medication Sig Start Date End Date Taking? Authorizing Provider  albuterol-ipratropium (COMBIVENT) 18-103 MCG/ACT inhaler Inhale 2 puffs into the lungs 4 (four) times daily.    Historical Provider, MD  B Complex Vitamins (VITAMIN-B COMPLEX PO) Take 1 tablet by mouth daily.    Historical Provider, MD  CALCIUM PO Take 1 tablet by mouth daily.    Historical Provider, MD  diazepam (VALIUM) 5 MG tablet Take 1 tablet (5 mg total) by mouth 3 (three) times daily. 04/15/14   [name], DO  diltiazem (CARDIZEM CD) 120 MG 24 hr capsule Take 120 mg by mouth daily.      Historical Provider, MD  feeding supplement, ENSURE COMPLETE, (ENSURE COMPLETE) LIQD Take 237 mLs by mouth 2 (two) times daily between meals. 04/15/14   [name], DO  food thickener (THICK IT) POWD Use as needed with meals 04/15/14   [name], DO  furosemide (LASIX) 40 MG tablet Take 40 mg by mouth daily as needed for fluid.     Historical Provider, MD  ipratropium-albuterol (DUONEB) 0.5-2.5 (3) MG/3ML SOLN Take 3 mLs by nebulization every 6 (six) hours as needed. For shortness of breath    Historical Provider, MD  labetalol (NORMODYNE) 300  MG tablet Take 300 mg by mouth 2 (two) times daily.      Historical Provider, MD  Multiple Vitamins-Minerals (CENTRUM ADULTS PO) Take 1 tablet by mouth daily.    Historical Provider, MD  traMADol (ULTRAM) 50 MG tablet Take 1 tablet (50 mg total) by mouth every 6 (six) hours as needed for moderate pain. 04/15/14   [name], DO   BP 182/71 mmHg  Pulse 91  Temp(Src) 101.8 F (38.8 C) (Rectal)  Resp 20  Ht 5\' 7"  (1.702 m)  Wt 106 lb 4.2 oz (48.2 kg)  BMI 16.64 kg/m2  SpO2 98% Physical Exam  Constitutional: She is oriented to person, place, and time. She appears well-developed and  well-nourished.  Follows most commands; mild general weakness  HENT:  Head: Normocephalic and atraumatic.  Mild dry mucous membranes  Eyes:  Difficult to exam  Neck: No tracheal deviation present.  Cardiovascular: Normal rate.   Pulmonary/Chest: Effort normal. No respiratory distress. She has rales (Mild rales in both bases).  Abdominal: Soft. There is no tenderness.  Musculoskeletal: She exhibits no edema.  Arms and legs equal strength bilateral, equal, sensation to pain intact.  Pt has mild pain paraspinal upper lumbar region.  Neurological: She is alert and oriented to person, place, and time.  No obvious pronator drift; good strength in the arms  Equal leg strength, difficult exam due to encephalopathy   Skin: Skin is warm and dry. No rash noted.  Psychiatric: She has a normal mood and affect. Her behavior is normal.  Nursing note and vitals reviewed.   ED Course  Procedures   DIAGNOSTIC STUDIES: Oxygen Saturation is 97% on RA, adequate by my interpretation.    COORDINATION OF CARE: 11:41 PM Discussed treatment plan with pt at bedside and pt agreed to plan.   Labs Review Labs Reviewed  COMPREHENSIVE METABOLIC PANEL - Abnormal; Notable for the following:    Glucose, Bld 100 (*)    Albumin 2.9 (*)    GFR calc non Af Amer 52 (*)    GFR calc Af Amer 61 (*)    All other components within normal limits  CBC WITH DIFFERENTIAL - Abnormal; Notable for the following:    WBC 11.4 (*)    RBC 3.55 (*)    Hemoglobin 10.5 (*)    HCT 31.8 (*)    Neutrophils Relative % 81 (*)    Neutro Abs 9.2 (*)    All other components within normal limits  URINALYSIS, ROUTINE W REFLEX MICROSCOPIC - Abnormal; Notable for the following:    Specific Gravity, Urine >1.030 (*)    Protein, ur TRACE (*)    Leukocytes, UA TRACE (*)    All other components within normal limits  URINE MICROSCOPIC-ADD ON - Abnormal; Notable for the following:    Squamous Epithelial / LPF FEW (*)    Bacteria, UA  MANY (*)    All other components within normal limits  CBC WITH DIFFERENTIAL - Abnormal; Notable for the following:    WBC 11.8 (*)    RBC 3.60 (*)    Hemoglobin 10.6 (*)    HCT 32.4 (*)    Neutrophils Relative % 81 (*)    Neutro Abs 9.6 (*)    All other components within normal limits  SEDIMENTATION RATE - Abnormal; Notable for the following:    Sed Rate 70 (*)    All other components within normal limits  CULTURE, BLOOD (ROUTINE X 2)  CULTURE, BLOOD (ROUTINE X 2)  URINE  CULTURE  MRSA PCR SCREENING  LACTIC ACID, PLASMA  CBC  CREATININE, SERUM  COMPREHENSIVE METABOLIC PANEL  C-REACTIVE PROTEIN  AMMONIA    Imaging Review Dg Chest 2 View  05/07/2014   CLINICAL DATA:  Back pain  EXAM: CHEST  2 VIEW  COMPARISON:  04/18/2014  FINDINGS: No cardiomegaly. Negative aortic and hilar contours. There is no edema, consolidation, effusion, or pneumothorax. Typical nipple shadow at the left base. No parenchymal nodule seen on recent chest imaging.  IMPRESSION: No active cardiopulmonary disease.   Electronically Signed   By: Jorje Guild M.D.   On: 05/07/2014 00:44   Ct Head Wo Contrast  05/07/2014   CLINICAL DATA:  Initial evaluation for acute confusion.  EXAM: CT HEAD WITHOUT CONTRAST  TECHNIQUE: Contiguous axial images were obtained from the base of the skull through the vertex without intravenous contrast.  COMPARISON:  Prior study from 04/09/2014.  FINDINGS: Scattered hypodensity within the periventricular and deep white matter both cerebral hemispheres is most consistent with mild chronic small vessel ischemic changes. Prominent vascular calcifications present within the carotid siphons bilaterally.  There is no acute intracranial hemorrhage or infarct. No mass lesion or midline shift. Gray-white matter differentiation is well maintained. Ventricles are normal in size without evidence of hydrocephalus. CSF containing spaces are within normal limits. No extra-axial fluid collection.  The  calvarium is intact.  Orbital soft tissues are within normal limits.  Scattered mucoperiosteal thickening present within the sphenoid sinuses and ethmoidal air cells bilaterally. No mastoid effusion.  Scalp soft tissues are unremarkable.  IMPRESSION: 1. No acute intracranial process. 2. Mild chronic small vessel ischemic disease. 3. Mild bilateral sphenoid and ethmoidal sinus disease.   Electronically Signed   By: Jeannine Boga M.D.   On: 05/07/2014 03:16   Ct Lumbar Spine Wo Contrast  05/07/2014   CLINICAL DATA:  One month of back pain and generalized weakness. History of recent falls. Initial encounter  EXAM: CT LUMBAR SPINE WITHOUT CONTRAST  TECHNIQUE: Multidetector CT imaging of the lumbar spine was performed without intravenous contrast administration. Multiplanar CT image reconstructions were also generated.  COMPARISON:  Abdominal CT 12/31/2008  FINDINGS: There is focal disc narrowing at L5-S1 with endplate erosions and sclerosis. These changes have developed since 2010. The surrounding soft tissues are infiltrated and there is ventral epidural thickening. Findings are highly concerning for discitis/osteomyelitis with epidural infection.  No acute fracture.  Osteopenia.  Diffuse degenerative disc bulging which mildly narrows the bilateral foramina. Disc bulge at L3-4 deforms the ventral thecal sac.  IMPRESSION: Findings consistent with L5-S1 discitis/osteomyelitis, likely with ventral epidural infection. Recommend lumbar spine MRI with contrast.   Electronically Signed   By: Jorje Guild M.D.   On: 05/07/2014 03:08     EKG Interpretation None      MDM   Final diagnoses:  Back pain  Confusion  Sepsis, due to unspecified organism  Discitis of lumbar region  Encephalopathy   I personally performed the services described in this documentation, which was scribed in my presence. The recorded information has been reviewed and is accurate.  Patient with worsening confusion  hallucinations and general weakness with intermittent back pain and recent discharge from nursing home. Discussed broad differential with family prior to them leaving. Concern for urine infection/bacteremia versus less likely discitis or osteomyelitis. Patient developed fever in the ER. Brought antibiotics and cultures ordered. Discussed case with [facility] hospitalist who agreed with admission. CT scan results reviewed concerning for discitis/osteomyelitis.  The patients results and  plan were reviewed and discussed.   Any x-rays performed were personally reviewed by myself.   Differential diagnosis were considered with the presenting HPI.  Medications  vancomycin (VANCOCIN) IVPB 1000 mg/200 mL premix (1,000 mg Intravenous Given 05/07/14 0531)  heparin injection 5,000 Units (5,000 Units Subcutaneous Given 05/07/14 0512)  0.9 %  sodium chloride infusion ( Intravenous New Bag/Given 05/07/14 0510)  diltiazem (DILACOR XR) 24 hr capsule 120 mg (not administered)  feeding supplement (ENSURE COMPLETE) (ENSURE COMPLETE) liquid 237 mL (not administered)  food thickener (THICK IT) powder (not administered)  ipratropium-albuterol (DUONEB) 0.5-2.5 (3) MG/3ML nebulizer solution 3 mL (not administered)  labetalol (NORMODYNE) tablet 300 mg (300 mg Oral Given 05/07/14 0512)  multivitamin with minerals tablet 1 tablet (not administered)  piperacillin-tazobactam (ZOSYN) IVPB 3.375 g (not administered)  vancomycin (VANCOCIN) IVPB 750 mg/150 ml premix (not administered)  piperacillin-tazobactam (ZOSYN) IVPB 3.375 g (3.375 g Intravenous New Bag/Given 05/07/14 0223)  acetaminophen (TYLENOL) suppository 650 mg (650 mg Rectal Given 05/07/14 0222)  sodium chloride 0.9 % bolus 500 mL (500 mLs Intravenous New Bag/Given 05/07/14 0223)    Filed Vitals:   05/06/14 2236 05/07/14 0144 05/07/14 0310 05/07/14 0437  BP: 185/73  182/71   Pulse: 94  91   Temp: 99.7 F (37.6 C) 102.1 F (38.9 C) 101.8 F (38.8 C)   TempSrc:  Oral Rectal Rectal   Resp: 20     Height: 5\' 7"  (1.702 m)   5\' 7"  (1.702 m)  Weight: 107 lb (48.535 kg)   106 lb 4.2 oz (48.2 kg)  SpO2: 97%  98%     Final diagnoses:  Back pain  Confusion  Sepsis, due to unspecified organism  Discitis of lumbar region  Encephalopathy    Admission/ observation were discussed with the admitting physician, patient and/or family and they are comfortable with the plan.    Lisa Clonts, MD 05/07/14 [id number]

## 2014-05-07 ENCOUNTER — Emergency Department (HOSPITAL_COMMUNITY): Payer: Medicare Other

## 2014-05-07 ENCOUNTER — Encounter (HOSPITAL_COMMUNITY): Payer: Self-pay

## 2014-05-07 DIAGNOSIS — I10 Essential (primary) hypertension: Secondary | ICD-10-CM | POA: Diagnosis present

## 2014-05-07 DIAGNOSIS — J449 Chronic obstructive pulmonary disease, unspecified: Secondary | ICD-10-CM | POA: Diagnosis present

## 2014-05-07 DIAGNOSIS — G9340 Encephalopathy, unspecified: Secondary | ICD-10-CM | POA: Diagnosis present

## 2014-05-07 DIAGNOSIS — A047 Enterocolitis due to Clostridium difficile: Secondary | ICD-10-CM | POA: Diagnosis present

## 2014-05-07 DIAGNOSIS — A419 Sepsis, unspecified organism: Secondary | ICD-10-CM | POA: Diagnosis present

## 2014-05-07 DIAGNOSIS — Z681 Body mass index (BMI) 19 or less, adult: Secondary | ICD-10-CM | POA: Diagnosis not present

## 2014-05-07 DIAGNOSIS — F17210 Nicotine dependence, cigarettes, uncomplicated: Secondary | ICD-10-CM | POA: Diagnosis present

## 2014-05-07 DIAGNOSIS — E43 Unspecified severe protein-calorie malnutrition: Secondary | ICD-10-CM | POA: Diagnosis present

## 2014-05-07 DIAGNOSIS — M549 Dorsalgia, unspecified: Secondary | ICD-10-CM

## 2014-05-07 DIAGNOSIS — Z8249 Family history of ischemic heart disease and other diseases of the circulatory system: Secondary | ICD-10-CM | POA: Diagnosis not present

## 2014-05-07 DIAGNOSIS — N390 Urinary tract infection, site not specified: Secondary | ICD-10-CM | POA: Diagnosis present

## 2014-05-07 DIAGNOSIS — R531 Weakness: Secondary | ICD-10-CM | POA: Diagnosis present

## 2014-05-07 LAB — CBC WITH DIFFERENTIAL/PLATELET
BASOS ABS: 0 10*3/uL (ref 0.0–0.1)
Basophils Absolute: 0 10*3/uL (ref 0.0–0.1)
Basophils Relative: 0 % (ref 0–1)
Basophils Relative: 0 % (ref 0–1)
EOS ABS: 0 10*3/uL (ref 0.0–0.7)
EOS ABS: 0 10*3/uL (ref 0.0–0.7)
Eosinophils Relative: 0 % (ref 0–5)
Eosinophils Relative: 0 % (ref 0–5)
HCT: 31.8 % — ABNORMAL LOW (ref 36.0–46.0)
HCT: 32.4 % — ABNORMAL LOW (ref 36.0–46.0)
HEMOGLOBIN: 10.5 g/dL — AB (ref 12.0–15.0)
Hemoglobin: 10.6 g/dL — ABNORMAL LOW (ref 12.0–15.0)
LYMPHS ABS: 1.5 10*3/uL (ref 0.7–4.0)
LYMPHS PCT: 14 % (ref 12–46)
Lymphocytes Relative: 13 % (ref 12–46)
Lymphs Abs: 1.6 10*3/uL (ref 0.7–4.0)
MCH: 29.4 pg (ref 26.0–34.0)
MCH: 29.6 pg (ref 26.0–34.0)
MCHC: 32.7 g/dL (ref 30.0–36.0)
MCHC: 33 g/dL (ref 30.0–36.0)
MCV: 89.6 fL (ref 78.0–100.0)
MCV: 90 fL (ref 78.0–100.0)
MONOS PCT: 6 % (ref 3–12)
Monocytes Absolute: 0.6 10*3/uL (ref 0.1–1.0)
Monocytes Absolute: 0.7 10*3/uL (ref 0.1–1.0)
Monocytes Relative: 5 % (ref 3–12)
Neutro Abs: 9.2 10*3/uL — ABNORMAL HIGH (ref 1.7–7.7)
Neutro Abs: 9.6 10*3/uL — ABNORMAL HIGH (ref 1.7–7.7)
Neutrophils Relative %: 81 % — ABNORMAL HIGH (ref 43–77)
Neutrophils Relative %: 81 % — ABNORMAL HIGH (ref 43–77)
PLATELETS: 230 10*3/uL (ref 150–400)
Platelets: 239 10*3/uL (ref 150–400)
RBC: 3.55 MIL/uL — AB (ref 3.87–5.11)
RBC: 3.6 MIL/uL — AB (ref 3.87–5.11)
RDW: 13.3 % (ref 11.5–15.5)
RDW: 13.3 % (ref 11.5–15.5)
WBC: 11.4 10*3/uL — ABNORMAL HIGH (ref 4.0–10.5)
WBC: 11.8 10*3/uL — ABNORMAL HIGH (ref 4.0–10.5)

## 2014-05-07 LAB — COMPREHENSIVE METABOLIC PANEL
ALT: 17 U/L (ref 0–35)
ALT: 20 U/L (ref 0–35)
AST: 27 U/L (ref 0–37)
AST: 33 U/L (ref 0–37)
Albumin: 2.6 g/dL — ABNORMAL LOW (ref 3.5–5.2)
Albumin: 2.9 g/dL — ABNORMAL LOW (ref 3.5–5.2)
Alkaline Phosphatase: 102 U/L (ref 39–117)
Alkaline Phosphatase: 88 U/L (ref 39–117)
Anion gap: 12 (ref 5–15)
Anion gap: 15 (ref 5–15)
BILIRUBIN TOTAL: 0.4 mg/dL (ref 0.3–1.2)
BUN: 20 mg/dL (ref 6–23)
BUN: 21 mg/dL (ref 6–23)
CALCIUM: 9 mg/dL (ref 8.4–10.5)
CO2: 25 mEq/L (ref 19–32)
CO2: 27 mEq/L (ref 19–32)
CREATININE: 0.99 mg/dL (ref 0.50–1.10)
Calcium: 9.4 mg/dL (ref 8.4–10.5)
Chloride: 102 mEq/L (ref 96–112)
Chloride: 103 mEq/L (ref 96–112)
Creatinine, Ser: 1.01 mg/dL (ref 0.50–1.10)
GFR calc Af Amer: 61 mL/min — ABNORMAL LOW (ref >90)
GFR calc Af Amer: 62 mL/min — ABNORMAL LOW (ref >90)
GFR calc non Af Amer: 52 mL/min — ABNORMAL LOW (ref >90)
GFR, EST NON AFRICAN AMERICAN: 54 mL/min — AB (ref >90)
GLUCOSE: 100 mg/dL — AB (ref 70–99)
Glucose, Bld: 108 mg/dL — ABNORMAL HIGH (ref 70–99)
POTASSIUM: 4.1 meq/L (ref 3.7–5.3)
Potassium: 3.7 mEq/L (ref 3.7–5.3)
Sodium: 141 mEq/L (ref 137–147)
Sodium: 143 mEq/L (ref 137–147)
TOTAL PROTEIN: 8.3 g/dL (ref 6.0–8.3)
Total Bilirubin: 0.4 mg/dL (ref 0.3–1.2)
Total Protein: 7.5 g/dL (ref 6.0–8.3)

## 2014-05-07 LAB — URINE MICROSCOPIC-ADD ON

## 2014-05-07 LAB — CREATININE, SERUM

## 2014-05-07 LAB — URINALYSIS, ROUTINE W REFLEX MICROSCOPIC
BILIRUBIN URINE: NEGATIVE
GLUCOSE, UA: NEGATIVE mg/dL
HGB URINE DIPSTICK: NEGATIVE
Ketones, ur: NEGATIVE mg/dL
Nitrite: NEGATIVE
UROBILINOGEN UA: 0.2 mg/dL (ref 0.0–1.0)
pH: 6 (ref 5.0–8.0)

## 2014-05-07 LAB — AMMONIA: Ammonia: 20 umol/L (ref 11–60)

## 2014-05-07 LAB — SEDIMENTATION RATE: SED RATE: 70 mm/h — AB (ref 0–22)

## 2014-05-07 LAB — C-REACTIVE PROTEIN: CRP: 3.2 mg/dL — AB (ref <0.60)

## 2014-05-07 LAB — MRSA PCR SCREENING: MRSA by PCR: POSITIVE — AB

## 2014-05-07 LAB — LACTIC ACID, PLASMA: Lactic Acid, Venous: 1 mmol/L (ref 0.5–2.2)

## 2014-05-07 MED ORDER — STARCH (THICKENING) PO POWD
ORAL | Status: DC | PRN
  Filled 2014-05-07 (×2): qty 227

## 2014-05-07 MED ORDER — ADULT MULTIVITAMIN W/MINERALS CH
1.0000 | ORAL_TABLET | Freq: Every day | ORAL | Status: DC
  Administered 2014-05-07 – 2014-05-12 (×6): 1 via ORAL
  Filled 2014-05-07 (×6): qty 1

## 2014-05-07 MED ORDER — SODIUM CHLORIDE 0.9 % IV BOLUS (SEPSIS)
500.0000 mL | Freq: Once | INTRAVENOUS | Status: AC
  Administered 2014-05-07: 500 mL via INTRAVENOUS

## 2014-05-07 MED ORDER — VANCOMYCIN HCL IN DEXTROSE 1-5 GM/200ML-% IV SOLN
INTRAVENOUS | Status: AC
  Filled 2014-05-07: qty 200

## 2014-05-07 MED ORDER — SODIUM CHLORIDE 0.9 % IV SOLN
INTRAVENOUS | Status: DC
  Administered 2014-05-07 (×2): via INTRAVENOUS

## 2014-05-07 MED ORDER — LABETALOL HCL 200 MG PO TABS
ORAL_TABLET | ORAL | Status: AC
  Filled 2014-05-07: qty 2

## 2014-05-07 MED ORDER — RESOURCE THICKENUP CLEAR PO POWD
ORAL | Status: DC | PRN
  Filled 2014-05-07: qty 125

## 2014-05-07 MED ORDER — HEPARIN SODIUM (PORCINE) 5000 UNIT/ML IJ SOLN
5000.0000 [IU] | Freq: Three times a day (TID) | INTRAMUSCULAR | Status: DC
  Administered 2014-05-07 – 2014-05-12 (×14): 5000 [IU] via SUBCUTANEOUS
  Filled 2014-05-07 (×15): qty 1

## 2014-05-07 MED ORDER — VANCOMYCIN HCL IN DEXTROSE 750-5 MG/150ML-% IV SOLN
INTRAVENOUS | Status: AC
  Filled 2014-05-07: qty 150

## 2014-05-07 MED ORDER — ACETAMINOPHEN 650 MG RE SUPP
650.0000 mg | Freq: Once | RECTAL | Status: AC
  Administered 2014-05-07: 650 mg via RECTAL
  Filled 2014-05-07: qty 1

## 2014-05-07 MED ORDER — ENSURE COMPLETE PO LIQD
237.0000 mL | Freq: Two times a day (BID) | ORAL | Status: DC
  Administered 2014-05-08 – 2014-05-12 (×8): 237 mL via ORAL
  Filled 2014-05-07: qty 237

## 2014-05-07 MED ORDER — DILTIAZEM HCL ER COATED BEADS 120 MG PO CP24
120.0000 mg | ORAL_CAPSULE | Freq: Every day | ORAL | Status: DC
  Administered 2014-05-07 – 2014-05-12 (×6): 120 mg via ORAL
  Filled 2014-05-07 (×9): qty 1

## 2014-05-07 MED ORDER — PIPERACILLIN-TAZOBACTAM 3.375 G IVPB 30 MIN
3.3750 g | Freq: Once | INTRAVENOUS | Status: AC
  Administered 2014-05-07: 3.375 g via INTRAVENOUS
  Filled 2014-05-07: qty 50

## 2014-05-07 MED ORDER — IPRATROPIUM-ALBUTEROL 0.5-2.5 (3) MG/3ML IN SOLN
3.0000 mL | Freq: Three times a day (TID) | RESPIRATORY_TRACT | Status: DC
  Administered 2014-05-07 – 2014-05-11 (×10): 3 mL via RESPIRATORY_TRACT
  Filled 2014-05-07 (×13): qty 3

## 2014-05-07 MED ORDER — VANCOMYCIN HCL IN DEXTROSE 1-5 GM/200ML-% IV SOLN
1000.0000 mg | Freq: Once | INTRAVENOUS | Status: AC
  Administered 2014-05-07: 1000 mg via INTRAVENOUS

## 2014-05-07 MED ORDER — PIPERACILLIN-TAZOBACTAM 3.375 G IVPB
3.3750 g | Freq: Three times a day (TID) | INTRAVENOUS | Status: DC
  Administered 2014-05-07 – 2014-05-11 (×13): 3.375 g via INTRAVENOUS
  Filled 2014-05-07 (×17): qty 50

## 2014-05-07 MED ORDER — LABETALOL HCL 200 MG PO TABS
300.0000 mg | ORAL_TABLET | Freq: Two times a day (BID) | ORAL | Status: DC
  Administered 2014-05-07 – 2014-05-12 (×12): 300 mg via ORAL
  Filled 2014-05-07 (×3): qty 2
  Filled 2014-05-07 (×2): qty 1
  Filled 2014-05-07 (×4): qty 2
  Filled 2014-05-07: qty 1
  Filled 2014-05-07 (×4): qty 2
  Filled 2014-05-07: qty 1

## 2014-05-07 MED ORDER — VANCOMYCIN HCL IN DEXTROSE 750-5 MG/150ML-% IV SOLN
750.0000 mg | INTRAVENOUS | Status: DC
  Administered 2014-05-08 – 2014-05-10 (×3): 750 mg via INTRAVENOUS
  Filled 2014-05-07 (×5): qty 150

## 2014-05-07 MED ORDER — IPRATROPIUM-ALBUTEROL 0.5-2.5 (3) MG/3ML IN SOLN: 3.0000 mL | Freq: Four times a day (QID) | RESPIRATORY_TRACT | Status: DC | PRN

## 2014-05-07 MED ORDER — PIPERACILLIN-TAZOBACTAM 3.375 G IVPB
INTRAVENOUS | Status: AC
  Filled 2014-05-07: qty 50

## 2014-05-07 NOTE — Progress Notes (Signed)
Subjective: Patient was admitted yesterday due to fever and change in mental status. She is being treated as case of sepsis. Patient was recently treated in [place] due to [place].  Objective: Vital signs in last 24 hours: Temp:  [98.8 F (37.1 C)-102.1 F (38.9 C)] 98.8 F (37.1 C) (11/26 0650) Pulse Rate:  [63-94] 67 (11/26 0827) Resp:  [20] 20 (11/26 0650) BP: (118-185)/(55-73) 159/62 mmHg (11/26 0827) SpO2:  [97 %-100 %] 100 % (11/26 0650) Weight:  [48.2 kg (106 lb 4.2 oz)-48.535 kg (107 lb)] 48.2 kg (106 lb 4.2 oz) (11/26 0437) Weight change:  Last BM Date: 05/07/14  Intake/Output from previous day: 11/25 0701 - 11/26 0700 In: 250 [I.V.:50; IV Piggyback:200] Out: -   PHYSICAL EXAM General appearance: alert, fatigued and no distress Resp: diminished breath sounds bilaterally and rhonchi bilaterally Cardio: S1, S2 normal GI: soft, non-tender; bowel sounds normal; no masses,  no organomegaly Extremities: extremities normal, atraumatic, no cyanosis or edema  Lab Results:  Results for orders placed or performed during the hospital encounter of 05/06/14 (from the past 48 hour(s))  Lactic acid, plasma     Status: None   Collection Time: 05/06/14 11:43 PM  Result Value Ref Range   Lactic Acid, Venous 1.0 0.5 - 2.2 mmol/L  Comprehensive metabolic panel     Status: Abnormal   Collection Time: 05/06/14 11:43 PM  Result Value Ref Range   Sodium 141 137 - 147 mEq/L   Potassium 4.1 3.7 - 5.3 mEq/L   Chloride 102 96 - 112 mEq/L   CO2 27 19 - 32 mEq/L   Glucose, Bld 100 (H) 70 - 99 mg/dL   BUN 21 6 - 23 mg/dL   Creatinine, Ser 1.01 0.50 - 1.10 mg/dL   Calcium 9.4 8.4 - 10.5 mg/dL   Total Protein 8.3 6.0 - 8.3 g/dL   Albumin 2.9 (L) 3.5 - 5.2 g/dL   AST 33 0 - 37 U/L   ALT 20 0 - 35 U/L   Alkaline Phosphatase 102 39 - 117 U/L   Total Bilirubin 0.4 0.3 - 1.2 mg/dL   GFR calc non Af Amer 52 (L) >90 mL/min   GFR calc Af Amer 61 (L) >90 mL/min    Comment: (NOTE) The  eGFR has been calculated using the CKD EPI equation. This calculation has not been validated in all clinical situations. eGFR's persistently <90 mL/min signify possible Chronic Kidney Disease.    Anion gap 12 5 - 15  CBC with Differential     Status: Abnormal   Collection Time: 05/06/14 11:43 PM  Result Value Ref Range   WBC 11.4 (H) 4.0 - 10.5 K/uL   RBC 3.55 (L) 3.87 - 5.11 MIL/uL   Hemoglobin 10.5 (L) 12.0 - 15.0 g/dL   HCT 31.8 (L) 36.0 - 46.0 %   MCV 89.6 78.0 - 100.0 fL   MCH 29.6 26.0 - 34.0 pg   MCHC 33.0 30.0 - 36.0 g/dL   RDW 13.3 11.5 - 15.5 %   Platelets 239 150 - 400 K/uL   Neutrophils Relative % 81 (H) 43 - 77 %   Neutro Abs 9.2 (H) 1.7 - 7.7 K/uL   Lymphocytes Relative 13 12 - 46 %   Lymphs Abs 1.5 0.7 - 4.0 K/uL   Monocytes Relative 6 3 - 12 %   Monocytes Absolute 0.7 0.1 - 1.0 K/uL   Eosinophils Relative 0 0 - 5 %   Eosinophils Absolute 0.0 0.0 - 0.7 K/uL  Basophils Relative 0 0 - 1 %   Basophils Absolute 0.0 0.0 - 0.1 K/uL  Blood culture (routine x 2)     Status: None (Preliminary result)   Collection Time: 05/06/14 11:51 PM  Result Value Ref Range   Specimen Description BLOOD RIGHT ANTECUBITAL    Special Requests BOTTLES DRAWN AEROBIC AND ANAEROBIC 6CC EACH    Culture NO GROWTH <24 HRS    Report Status PENDING   Blood culture (routine x 2)     Status: None (Preliminary result)   Collection Time: 05/06/14 11:56 PM  Result Value Ref Range   Specimen Description BLOOD RIGHT ARM    Special Requests BOTTLES DRAWN AEROBIC AND ANAEROBIC 10CC EACH    Culture NO GROWTH <24 HRS    Report Status PENDING   Urinalysis, Routine w reflex microscopic     Status: Abnormal   Collection Time: 05/07/14 12:38 AM  Result Value Ref Range   Color, Urine YELLOW YELLOW   APPearance CLEAR CLEAR   Specific Gravity, Urine >1.030 (H) 1.005 - 1.030   pH 6.0 5.0 - 8.0   Glucose, UA NEGATIVE NEGATIVE mg/dL   Hgb urine dipstick NEGATIVE NEGATIVE   Bilirubin Urine NEGATIVE  NEGATIVE   Ketones, ur NEGATIVE NEGATIVE mg/dL   Protein, ur TRACE (A) NEGATIVE mg/dL   Urobilinogen, UA 0.2 0.0 - 1.0 mg/dL   Nitrite NEGATIVE NEGATIVE   Leukocytes, UA TRACE (A) NEGATIVE  Urine microscopic-add on     Status: Abnormal   Collection Time: 05/07/14 12:38 AM  Result Value Ref Range   Squamous Epithelial / LPF FEW (A) RARE   WBC, UA 3-6 <3 WBC/hpf   RBC / HPF 0-2 <3 RBC/hpf   Bacteria, UA MANY (A) RARE   Urine-Other MUCOUS PRESENT   Comprehensive metabolic panel     Status: Abnormal   Collection Time: 05/07/14  3:49 AM  Result Value Ref Range   Sodium 143 137 - 147 mEq/L   Potassium 3.7 3.7 - 5.3 mEq/L   Chloride 103 96 - 112 mEq/L   CO2 25 19 - 32 mEq/L   Glucose, Bld 108 (H) 70 - 99 mg/dL   BUN 20 6 - 23 mg/dL   Creatinine, Ser 0.99 0.50 - 1.10 mg/dL   Calcium 9.0 8.4 - 10.5 mg/dL   Total Protein 7.5 6.0 - 8.3 g/dL   Albumin 2.6 (L) 3.5 - 5.2 g/dL   AST 27 0 - 37 U/L   ALT 17 0 - 35 U/L   Alkaline Phosphatase 88 39 - 117 U/L   Total Bilirubin 0.4 0.3 - 1.2 mg/dL   GFR calc non Af Amer 54 (L) >90 mL/min   GFR calc Af Amer 62 (L) >90 mL/min    Comment: (NOTE) The eGFR has been calculated using the CKD EPI equation. This calculation has not been validated in all clinical situations. eGFR's persistently <90 mL/min signify possible Chronic Kidney Disease.    Anion gap 15 5 - 15  CBC WITH DIFFERENTIAL     Status: Abnormal   Collection Time: 05/07/14  3:49 AM  Result Value Ref Range   WBC 11.8 (H) 4.0 - 10.5 K/uL   RBC 3.60 (L) 3.87 - 5.11 MIL/uL   Hemoglobin 10.6 (L) 12.0 - 15.0 g/dL   HCT 32.4 (L) 36.0 - 46.0 %   MCV 90.0 78.0 - 100.0 fL   MCH 29.4 26.0 - 34.0 pg   MCHC 32.7 30.0 - 36.0 g/dL   RDW 13.3 11.5 - 15.5 %  Platelets 230 150 - 400 K/uL   Neutrophils Relative % 81 (H) 43 - 77 %   Neutro Abs 9.6 (H) 1.7 - 7.7 K/uL   Lymphocytes Relative 14 12 - 46 %   Lymphs Abs 1.6 0.7 - 4.0 K/uL   Monocytes Relative 5 3 - 12 %   Monocytes Absolute 0.6 0.1  - 1.0 K/uL   Eosinophils Relative 0 0 - 5 %   Eosinophils Absolute 0.0 0.0 - 0.7 K/uL   Basophils Relative 0 0 - 1 %   Basophils Absolute 0.0 0.0 - 0.1 K/uL  Sedimentation rate     Status: Abnormal   Collection Time: 05/07/14  3:54 AM  Result Value Ref Range   Sed Rate 70 (H) 0 - 22 mm/hr  Ammonia     Status: None   Collection Time: 05/07/14  3:54 AM  Result Value Ref Range   Ammonia 20 11 - 60 umol/L  MRSA PCR Screening     Status: Abnormal   Collection Time: 05/07/14  4:50 AM  Result Value Ref Range   MRSA by PCR POSITIVE (A) NEGATIVE    Comment: CRITICAL RESULT CALLED TO, READ BACK BY AND VERIFIED WITH: FERGUSON,M AT 10:20AM ON 05/07/14 BY FESTERMAN,C        The GeneXpert MRSA Assay (FDA approved for NASAL specimens only), is one component of a comprehensive MRSA colonization surveillance program. It is not intended to diagnose MRSA infection nor to guide or monitor treatment for MRSA infections.   Creatinine, serum     Status: None   Collection Time: 05/07/14  5:00 AM  Result Value Ref Range   GFR calc non Af Amer NOT CALCULATED >90 mL/min   GFR calc Af Amer NOT CALCULATED >90 mL/min    Comment: (NOTE) The eGFR has been calculated using the CKD EPI equation. This calculation has not been validated in all clinical situations. eGFR's persistently <90 mL/min signify possible Chronic Kidney Disease.     ABGS No results for input(s): PHART, PO2ART, TCO2, HCO3 in the last 72 hours.  Invalid input(s): PCO2 CULTURES Recent Results (from the past 240 hour(s))  Blood culture (routine x 2)     Status: None (Preliminary result)   Collection Time: 05/06/14 11:51 PM  Result Value Ref Range Status   Specimen Description BLOOD RIGHT ANTECUBITAL  Final   Special Requests BOTTLES DRAWN AEROBIC AND ANAEROBIC [place]  Final   Culture NO GROWTH <24 HRS  Final   Report Status PENDING  Incomplete  Blood culture (routine x 2)     Status: None (Preliminary result)   Collection  Time: 05/06/14 11:56 PM  Result Value Ref Range Status   Specimen Description BLOOD RIGHT ARM  Final   Special Requests BOTTLES DRAWN AEROBIC AND ANAEROBIC 10CC EACH  Final   Culture NO GROWTH <24 HRS  Final   Report Status PENDING  Incomplete  MRSA PCR Screening     Status: Abnormal   Collection Time: 05/07/14  4:50 AM  Result Value Ref Range Status   MRSA by PCR POSITIVE (A) NEGATIVE Final    Comment: CRITICAL RESULT CALLED TO, READ BACK BY AND VERIFIED WITH: FERGUSON,M AT 10:20AM ON 05/07/14 BY FESTERMAN,C        The GeneXpert MRSA Assay (FDA approved for NASAL specimens only), is one component of a comprehensive MRSA colonization surveillance program. It is not intended to diagnose MRSA infection nor to guide or monitor treatment for MRSA infections.    Studies/Results: Dg Chest  2 View  05/07/2014   CLINICAL DATA:  Back pain  EXAM: CHEST  2 VIEW  COMPARISON:  04/18/2014  FINDINGS: No cardiomegaly. Negative aortic and hilar contours. There is no edema, consolidation, effusion, or pneumothorax. Typical nipple shadow at the left base. No parenchymal nodule seen on recent chest imaging.  IMPRESSION: No active cardiopulmonary disease.   Electronically Signed   By: Jorje Guild M.D.   On: 05/07/2014 00:44   Ct Head Wo Contrast  05/07/2014   CLINICAL DATA:  Initial evaluation for acute confusion.  EXAM: CT HEAD WITHOUT CONTRAST  TECHNIQUE: Contiguous axial images were obtained from the base of the skull through the vertex without intravenous contrast.  COMPARISON:  Prior study from 04/09/2014.  FINDINGS: Scattered hypodensity within the periventricular and deep white matter both cerebral hemispheres is most consistent with mild chronic small vessel ischemic changes. Prominent vascular calcifications present within the carotid siphons bilaterally.  There is no acute intracranial hemorrhage or infarct. No mass lesion or midline shift. Gray-white matter differentiation is well maintained.  Ventricles are normal in size without evidence of hydrocephalus. CSF containing spaces are within normal limits. No extra-axial fluid collection.  The calvarium is intact.  Orbital soft tissues are within normal limits.  Scattered mucoperiosteal thickening present within the sphenoid sinuses and ethmoidal air cells bilaterally. No mastoid effusion.  Scalp soft tissues are unremarkable.  IMPRESSION: 1. No acute intracranial process. 2. Mild chronic small vessel ischemic disease. 3. Mild bilateral sphenoid and ethmoidal sinus disease.   Electronically Signed   By: Jeannine Boga M.D.   On: 05/07/2014 03:16   Ct Lumbar Spine Wo Contrast  05/07/2014   CLINICAL DATA:  One month of back pain and generalized weakness. History of recent falls. Initial encounter  EXAM: CT LUMBAR SPINE WITHOUT CONTRAST  TECHNIQUE: Multidetector CT imaging of the lumbar spine was performed without intravenous contrast administration. Multiplanar CT image reconstructions were also generated.  COMPARISON:  Abdominal CT 12/31/2008  FINDINGS: There is focal disc narrowing at L5-S1 with endplate erosions and sclerosis. These changes have developed since 2010. The surrounding soft tissues are infiltrated and there is ventral epidural thickening. Findings are highly concerning for discitis/osteomyelitis with epidural infection.  No acute fracture.  Osteopenia.  Diffuse degenerative disc bulging which mildly narrows the bilateral foramina. Disc bulge at L3-4 deforms the ventral thecal sac.  IMPRESSION: Findings consistent with L5-S1 discitis/osteomyelitis, likely with ventral epidural infection. Recommend lumbar spine MRI with contrast.   Electronically Signed   By: Jorje Guild M.D.   On: 05/07/2014 03:08    Medications: I have reviewed the patient's current medications.  Assesment: Active Problems:   Sepsis   Encephalopathy   Back pain COPD   Plan: Medications reviewed Will continue combinations of antibiotics Will  follow culture result Continue supportive care.    LOS: 1 day   Lanis Storlie 05/07/2014, 11:14 AM

## 2014-05-07 NOTE — H&P (Signed)
Hospitalist Admission History and Physical  Patient name: Lisa [name] Medical record number: [id number] Date of birth: [birth year] Age: 79 y.o. Gender: female  Primary Care Provider: [name], MD  Chief Complaint: sepsis, ? Back pain, UTI encephalopathy   History of Present Illness:This is a 79 y.o. year old female with significant past medical history of strep viridans bacteremia, HTN, COPD presenting with sepsis, ? Back pain, UTI encephalopathy . Level V caveat in setting of AMS. Unclear of baseline. No family at bedside. Pt noted to have been admitted 10/26-11/4 for strep viridans bacteremia. Pt was discharged to [facility]. Was formally discharged yesterday. Per report, pt lives at home. Has family that checks on her. Per report, there was a complaint of back pain by pt.  Presents to ER T 102.1, HR 90s, resp 20, BP 185/73, satting 97 % on RA. WBC 11.4, hgb 10.5, Cr 1.01, Glu 100. CXR WNL. Lactate 1. UA indicative of infection. Head CT WNL. CT L spine pending per EDP.   Assessment and Plan: Lisa [name] is a 78 y.o. year old female presenting with sepsis, ? Back pain, UTI encephalopathy  Active Problems:   Sepsis   Encephalopathy   Back pain   1- Sepsis  -meets criteria by temp, HR -noted recent strep viridans bacteremia -+ UTI  -vanc and zosyn -blood and urine cultures -minimal reproducible back pain on exam  -CT L spine pending  -check ESR -PT/OT-may benefit from long term SNF given living situation-touch base with family   2-HTN -BP stable -titrate meds -dry-hold diuretic  -gently hydrate   3- Encephalopathy -unclear if this is baseline, though i suspect so  -head CT WNL  -follow in setting of above -ammonia level   4-COPD  -no resp distress -cont home regimen   FEN/GI: NPO  Prophylaxis: sub q heparin  Disposition: pending further evaluation  Code Status:Full Code    Patient Active Problem List   Diagnosis Date Noted  . Sepsis 05/07/2014   . Acute renal failure syndrome   . Bacteremia   . COLD (chronic obstructive lung disease)   . Viridans streptococci infection   . H/O goiter 04/10/2014  . Moderate protein-calorie malnutrition 04/10/2014  . Acute encephalopathy 04/09/2014  . Streptococcus viridans infection 04/08/2014  . Leukocytosis 04/07/2014  . Dehydration 04/07/2014  . Fever 04/06/2014  . UTI (lower urinary tract infection) 04/06/2014  . ARF (acute renal failure) 04/06/2014  . Pyelonephritis, acute 04/06/2014  . Hypertension   . COPD (chronic obstructive pulmonary disease)    Past Medical History: Past Medical History  Diagnosis Date  . COPD (chronic obstructive pulmonary disease)   . Hypertension   . Pyelonephritis 04/06/2014    UTI    Past Surgical History: Past Surgical History  Procedure Laterality Date  . Abdominal hysterectomy    . Tee without cardioversion N/A 04/10/2014    Procedure: TRANSESOPHAGEAL ECHOCARDIOGRAM (TEE);  Surgeon: [name], MD;  Location: [facility];  Service: Cardiovascular;  Laterality: N/A;    Social History: History   Social History  . Marital Status: Married    Spouse Name: N/A    Number of Children: N/A  . Years of Education: N/A   Social History Main Topics  . Smoking status: Current Every Day Smoker -- 0.30 packs/day    Types: Cigarettes  . Smokeless tobacco: Never Used  . Alcohol Use: No  . Drug Use: No  . Sexual Activity: None   Other Topics Concern  . None  Social History Narrative    Family History: Family History  Problem Relation Age of Onset  . Hypertension Mother   . Hypertension Father     Allergies: No Known Allergies  Current Facility-Administered Medications  Medication Dose Route Frequency Provider Last Rate Last Dose  . 0.9 %  sodium chloride infusion   Intravenous Continuous Shanda Howells, MD      . CENTRUM ADULTS TABS   Oral Daily Shanda Howells, MD      . diltiazem (CARDIZEM CD) 24 hr capsule 120 mg  120 mg Oral Daily  Shanda Howells, MD      . feeding supplement (ENSURE COMPLETE) (ENSURE COMPLETE) liquid 237 mL  237 mL Oral BID BM Shanda Howells, MD      . food thickener (THICK IT) powder   Oral PRN Shanda Howells, MD      . heparin injection 5,000 Units  5,000 Units Subcutaneous 3 times per day Shanda Howells, MD      . ipratropium-albuterol (DUONEB) 0.5-2.5 (3) MG/3ML nebulizer solution 3 mL  3 mL Nebulization Q6H PRN Shanda Howells, MD      . labetalol (NORMODYNE) tablet 300 mg  300 mg Oral BID Shanda Howells, MD      . piperacillin-tazobactam (ZOSYN) IVPB 3.375 g  3.375 g Intravenous Once [name], MD 100 mL/hr at 05/07/14 0223 3.375 g at 05/07/14 0223  . vancomycin (VANCOCIN) IVPB 1000 mg/200 mL premix  1,000 mg Intravenous Once [name], MD       Current Outpatient Prescriptions  Medication Sig Dispense Refill  . albuterol-ipratropium (COMBIVENT) 18-103 MCG/ACT inhaler Inhale 2 puffs into the lungs 4 (four) times daily.    . B Complex Vitamins (VITAMIN-B COMPLEX PO) Take 1 tablet by mouth daily.    Marland Kitchen CALCIUM PO Take 1 tablet by mouth daily.    . diazepam (VALIUM) 5 MG tablet Take 1 tablet (5 mg total) by mouth 3 (three) times daily. 30 tablet 0  . diltiazem (CARDIZEM CD) 120 MG 24 hr capsule Take 120 mg by mouth daily.      . feeding supplement, ENSURE COMPLETE, (ENSURE COMPLETE) LIQD Take 237 mLs by mouth 2 (two) times daily between meals.    . food thickener (THICK IT) POWD Use as needed with meals  0  . furosemide (LASIX) 40 MG tablet Take 40 mg by mouth daily as needed for fluid.     Marland Kitchen ipratropium-albuterol (DUONEB) 0.5-2.5 (3) MG/3ML SOLN Take 3 mLs by nebulization every 6 (six) hours as needed. For shortness of breath    . labetalol (NORMODYNE) 300 MG tablet Take 300 mg by mouth 2 (two) times daily.      . Multiple Vitamins-Minerals (CENTRUM ADULTS PO) Take 1 tablet by mouth daily.    . traMADol (ULTRAM) 50 MG tablet Take 1 tablet (50 mg total) by mouth every 6 (six) hours as needed for  moderate pain. 30 tablet 0   Review Of Systems: 12 point ROS negative except as noted above in HPI.  Physical Exam: Filed Vitals:   05/07/14 0310  BP: 182/71  Pulse: 91  Temp: 101.8 F (38.8 C)  Resp:     General: confused, dry on exam  HEENT: PERRLA, extra ocular movement intact and dry oral mucosa Heart: S1, S2 normal, no murmur, rub or gallop, regular rate and rhythm Lungs: clear to auscultation, no wheezes or rales and unlabored breathing Abdomen: abdomen is soft without significant tenderness, masses, organomegaly or guarding Extremities: extremities normal, atraumatic, no  cyanosis or edema Skin:no rashes Neurology: encephalopathic   Labs and Imaging: Lab Results  Component Value Date/Time   NA 141 05/06/2014 11:43 PM   K 4.1 05/06/2014 11:43 PM   CL 102 05/06/2014 11:43 PM   CO2 27 05/06/2014 11:43 PM   BUN 21 05/06/2014 11:43 PM   CREATININE 1.01 05/06/2014 11:43 PM   GLUCOSE 100* 05/06/2014 11:43 PM   Lab Results  Component Value Date   WBC 11.4* 05/06/2014   HGB 10.5* 05/06/2014   HCT 31.8* 05/06/2014   MCV 89.6 05/06/2014   PLT 239 05/06/2014   Urinalysis    Component Value Date/Time   COLORURINE YELLOW 05/07/2014 0038   APPEARANCEUR CLEAR 05/07/2014 0038   LABSPEC >1.030* 05/07/2014 0038   PHURINE 6.0 05/07/2014 0038   GLUCOSEU NEGATIVE 05/07/2014 0038   HGBUR NEGATIVE 05/07/2014 0038   BILIRUBINUR NEGATIVE 05/07/2014 0038   KETONESUR NEGATIVE 05/07/2014 0038   PROTEINUR TRACE* 05/07/2014 0038   UROBILINOGEN 0.2 05/07/2014 0038   NITRITE NEGATIVE 05/07/2014 0038   LEUKOCYTESUR TRACE* 05/07/2014 0038       Dg Chest 2 View  05/07/2014   CLINICAL DATA:  Back pain  EXAM: CHEST  2 VIEW  COMPARISON:  04/18/2014  FINDINGS: No cardiomegaly. Negative aortic and hilar contours. There is no edema, consolidation, effusion, or pneumothorax. Typical nipple shadow at the left base. No parenchymal nodule seen on recent chest imaging.  IMPRESSION: No active  cardiopulmonary disease.   Electronically Signed   By: Jorje Guild M.D.   On: 05/07/2014 00:44           Shanda Howells MD  Pager: [phone]

## 2014-05-07 NOTE — Progress Notes (Signed)
ANTIBIOTIC CONSULT NOTE - INITIAL  Pharmacy Consult for vancomycin & Zosyn Indication: R/O sepsis  No Known Allergies  Patient Measurements: Height: 5\' 7"  (170.2 cm) Weight: 107 lb (48.535 kg) IBW/kg (Calculated) : 61.6    Actual weigh less than IBW  Vital Signs: Temp: 102.1 F (38.9 C) (11/26 0144) Temp Source: Rectal (11/26 0144) BP: 185/73 mmHg (11/25 2236) Pulse Rate: 94 (11/25 2236) Intake/Output from previous day:   Intake/Output from this shift:    Labs:  Recent Labs  05/06/14 2343  WBC 11.4*  HGB 10.5*  PLT 239  CREATININE 1.01   Est CrCl 20-83ml/min - patient < IBW , with low serum albumin ( therefore probable larger volume of distribution per body mass for vancomycin)    Microbiology: Recent Results (from the past 720 hour(s))  Culture, blood (routine x 2)     Status: None   Collection Time: 04/09/14 12:30 PM  Result Value Ref Range Status   Specimen Description BLOOD LEFT ARM  Final   Special Requests BOTTLES DRAWN AEROBIC ONLY [facility]  Final   Culture  Setup Time   Final    04/09/2014 17:55 Performed at [facility]    Culture   Final    NO GROWTH 5 DAYS Performed at [facility]    Report Status 04/15/2014 FINAL  Final  Culture, blood (routine x 2)     Status: None   Collection Time: 04/09/14 12:50 PM  Result Value Ref Range Status   Specimen Description BLOOD LEFT HAND  Final   Special Requests BOTTLES DRAWN AEROBIC ONLY [place]  Final   Culture  Setup Time   Final    04/09/2014 17:55 Performed at [facility]    Culture   Final    NO GROWTH 5 DAYS Performed at [facility]    Report Status 04/15/2014 FINAL  Final  Blood culture (routine x 2)     Status: None (Preliminary result)   Collection Time: 05/06/14 11:51 PM  Result Value Ref Range Status   Specimen Description BLOOD RIGHT ANTECUBITAL  Final   Special Requests BOTTLES DRAWN AEROBIC AND ANAEROBIC [facility] EACH  Final   Culture PENDING  Incomplete   Report  Status PENDING  Incomplete  Blood culture (routine x 2)     Status: None (Preliminary result)   Collection Time: 05/06/14 11:56 PM  Result Value Ref Range Status   Specimen Description BLOOD RIGHT ARM  Final   Special Requests BOTTLES DRAWN AEROBIC AND ANAEROBIC 10CC EACH  Final   Culture PENDING  Incomplete   Report Status PENDING  Incomplete    Medical History: Past Medical History  Diagnosis Date  . COPD (chronic obstructive pulmonary disease)   . Hypertension   . Pyelonephritis 04/06/2014    UTI    Medications:  Scheduled:  . CENTRUM ADULTS   Oral Daily  . diltiazem  120 mg Oral Daily  . feeding supplement (ENSURE COMPLETE)  237 mL Oral BID BM  . heparin  5,000 Units Subcutaneous 3 times per day  . labetalol  300 mg Oral BID  . vancomycin  750 mg Intravenous Q24H   Infusions:  . sodium chloride    . piperacillin-tazobactam 3.375 g (05/07/14 0223)  . piperacillin-tazobactam (ZOSYN)  IV    . vancomycin     PRN: food thickener, ipratropium-albuterol   Assessment:   32yr female, body mass very small, to be covered with vancomycin & Zosyn for R/O sepsis.  Patient has received 1  dose Zosyn 3.375gm and will receive 1 dose vancomycin 1gm IV loading dose  Goal of Therapy:  Desire vancomycin serum trough level to be 15-48mcg/ml.  With CrCl est >60ml/min, will use standard Zosyn regimen  Plan:  1.  Zosyn 3.375gm IV q8h (4hr infusions) 2.  Vancomycin 750mg  IV q24h starting late tonight 3.  Monitor for indices of infection and renal function 4.  Measure actual steady state serum vancomycin trough level as clinically indicated  Juniper Snyders E 05/07/2014,2:49 AM

## 2014-05-08 LAB — CBC WITH DIFFERENTIAL/PLATELET
BASOS ABS: 0 10*3/uL (ref 0.0–0.1)
BASOS PCT: 0 % (ref 0–1)
EOS PCT: 1 % (ref 0–5)
Eosinophils Absolute: 0.1 10*3/uL (ref 0.0–0.7)
HCT: 28.8 % — ABNORMAL LOW (ref 36.0–46.0)
Hemoglobin: 9.4 g/dL — ABNORMAL LOW (ref 12.0–15.0)
Lymphocytes Relative: 25 % (ref 12–46)
Lymphs Abs: 2.1 10*3/uL (ref 0.7–4.0)
MCH: 29.1 pg (ref 26.0–34.0)
MCHC: 32.6 g/dL (ref 30.0–36.0)
MCV: 89.2 fL (ref 78.0–100.0)
MONO ABS: 0.5 10*3/uL (ref 0.1–1.0)
Monocytes Relative: 6 % (ref 3–12)
Neutro Abs: 5.7 10*3/uL (ref 1.7–7.7)
Neutrophils Relative %: 68 % (ref 43–77)
Platelets: 210 10*3/uL (ref 150–400)
RBC: 3.23 MIL/uL — ABNORMAL LOW (ref 3.87–5.11)
RDW: 13.3 % (ref 11.5–15.5)
WBC: 8.5 10*3/uL (ref 4.0–10.5)

## 2014-05-08 LAB — COMPREHENSIVE METABOLIC PANEL
ALK PHOS: 84 U/L (ref 39–117)
ALT: 15 U/L (ref 0–35)
AST: 28 U/L (ref 0–37)
Albumin: 2.5 g/dL — ABNORMAL LOW (ref 3.5–5.2)
Anion gap: 13 (ref 5–15)
BILIRUBIN TOTAL: 0.5 mg/dL (ref 0.3–1.2)
BUN: 16 mg/dL (ref 6–23)
CALCIUM: 9 mg/dL (ref 8.4–10.5)
CO2: 25 meq/L (ref 19–32)
CREATININE: 1.01 mg/dL (ref 0.50–1.10)
Chloride: 106 mEq/L (ref 96–112)
GFR calc Af Amer: 61 mL/min — ABNORMAL LOW (ref >90)
GFR calc non Af Amer: 52 mL/min — ABNORMAL LOW (ref >90)
GLUCOSE: 73 mg/dL (ref 70–99)
Potassium: 3.3 mEq/L — ABNORMAL LOW (ref 3.7–5.3)
SODIUM: 144 meq/L (ref 137–147)
TOTAL PROTEIN: 7 g/dL (ref 6.0–8.3)

## 2014-05-08 LAB — URINE CULTURE
COLONY COUNT: NO GROWTH
Culture: NO GROWTH

## 2014-05-08 LAB — CLOSTRIDIUM DIFFICILE BY PCR: Toxigenic C. Difficile by PCR: POSITIVE — AB

## 2014-05-08 MED ORDER — LORAZEPAM 0.5 MG PO TABS
0.5000 mg | ORAL_TABLET | Freq: Four times a day (QID) | ORAL | Status: DC | PRN
  Administered 2014-05-08 – 2014-05-09 (×2): 0.5 mg via ORAL
  Filled 2014-05-08 (×4): qty 1

## 2014-05-08 MED ORDER — MUPIROCIN 2 % EX OINT
1.0000 "application " | TOPICAL_OINTMENT | Freq: Two times a day (BID) | CUTANEOUS | Status: AC
  Administered 2014-05-08 – 2014-05-12 (×10): 1 via NASAL
  Filled 2014-05-08 (×3): qty 22

## 2014-05-08 MED ORDER — METRONIDAZOLE 500 MG PO TABS
500.0000 mg | ORAL_TABLET | Freq: Three times a day (TID) | ORAL | Status: DC
  Administered 2014-05-08 – 2014-05-12 (×12): 500 mg via ORAL
  Filled 2014-05-08 (×12): qty 1

## 2014-05-08 MED ORDER — CHLORHEXIDINE GLUCONATE CLOTH 2 % EX PADS
6.0000 | MEDICATED_PAD | Freq: Every day | CUTANEOUS | Status: AC
  Administered 2014-05-08 – 2014-05-12 (×5): 6 via TOPICAL

## 2014-05-08 NOTE — Clinical Social Work Psychosocial (Signed)
Clinical Social Work Department BRIEF PSYCHOSOCIAL ASSESSMENT 05/08/2014  Patient:  Lisa [name], Lisa [name]     Account Number:  [id number]     Admit date:  05/06/2014  Clinical Social Worker:  Daiva Huge  Date/Time:  05/08/2014 03:52 PM  Referred by:  Physician  Date Referred:  05/08/2014 Referred for  SNF Placement   Other Referral:   Interview type:  Patient Other interview type:   ALSO SPOKE WITH HUSBAND BY PHONE    PSYCHOSOCIAL DATA Living Status:  FAMILY Admitted from facility:   Level of care:   Primary support name:  HUSBAND Primary support relationship to patient:  FAMILY Degree of support available:   MINIMAL    CURRENT CONCERNS Current Concerns  Post-Acute Placement   Other Concerns:    SOCIAL WORK ASSESSMENT / PLAN Spoke with pateint who tells me she lives with her husband of 46 years locally. She agrees to considering SNF placement and apparently was just released from [facility] earlier this week- she is agreeable to considering SNF again- CSW will proceed with this plan.   Assessment/plan status:  Other - See comment Other assessment/ plan:   FL2 and PASARR for SNF   Information/referral to community resources:    PATIENT'S/FAMILY'S RESPONSE TO PLAN OF CARE: Patient agreeable to SNF search, stating, "I think it would be a good idea'. Her husband, was hesitant to this plan and wants to talk to other family about plans- With patient's permission, CSW will pursue SNF options in case SNF is needed and desired.       Eduard Clos, MSW, [place]

## 2014-05-08 NOTE — Progress Notes (Signed)
Patient had several loose stools, sent stool to lab for r/o C-dif. Lab notified me that patient is in fact positive for C-dif. Placed patient on enteric precautions will notify the MD and follow any orders given.

## 2014-05-08 NOTE — Progress Notes (Signed)
Subjective: Patient is more alert and awake. Her stool is positive for c.diffici toxins. No fever or chills.  Objective: Vital signs in last 24 hours: Temp:  [97.8 F (36.6 C)-99.1 F (37.3 C)] 97.8 F (36.6 C) (11/27 1975) Pulse Rate:  [70-72] 72 (11/27 0642) Resp:  [20] 20 (11/27 0642) BP: (124-177)/(56-76) 177/76 mmHg (11/27 0642) SpO2:  [94 %-100 %] 94 % (11/27 0726) Weight:  [46.9 kg (103 lb 6.3 oz)] 46.9 kg (103 lb 6.3 oz) (11/27 0500) Weight change: -1.635 kg (-3 lb 9.7 oz) Last BM Date: 05/07/14  Intake/Output from previous day: 11/26 0701 - 11/27 0700 In: 0  Out: 7 [Urine:3; Stool:4]  PHYSICAL EXAM General appearance: alert, fatigued and no distress Resp: diminished breath sounds bilaterally and rhonchi bilaterally Cardio: S1, S2 normal GI: soft, non-tender; bowel sounds normal; no masses,  no organomegaly Extremities: extremities normal, atraumatic, no cyanosis or edema  Lab Results:  Results for orders placed or performed during the hospital encounter of 05/06/14 (from the past 48 hour(s))  Lactic acid, plasma     Status: None   Collection Time: 05/06/14 11:43 PM  Result Value Ref Range   Lactic Acid, Venous 1.0 0.5 - 2.2 mmol/L  Comprehensive metabolic panel     Status: Abnormal   Collection Time: 05/06/14 11:43 PM  Result Value Ref Range   Sodium 141 137 - 147 mEq/L   Potassium 4.1 3.7 - 5.3 mEq/L   Chloride 102 96 - 112 mEq/L   CO2 27 19 - 32 mEq/L   Glucose, Bld 100 (H) 70 - 99 mg/dL   BUN 21 6 - 23 mg/dL   Creatinine, Ser 1.01 0.50 - 1.10 mg/dL   Calcium 9.4 8.4 - 10.5 mg/dL   Total Protein 8.3 6.0 - 8.3 g/dL   Albumin 2.9 (L) 3.5 - 5.2 g/dL   AST 33 0 - 37 U/L   ALT 20 0 - 35 U/L   Alkaline Phosphatase 102 39 - 117 U/L   Total Bilirubin 0.4 0.3 - 1.2 mg/dL   GFR calc non Af Amer 52 (L) >90 mL/min   GFR calc Af Amer 61 (L) >90 mL/min    Comment: (NOTE) The eGFR has been calculated using the CKD EPI equation. This calculation has not been  validated in all clinical situations. eGFR's persistently <90 mL/min signify possible Chronic Kidney Disease.    Anion gap 12 5 - 15  CBC with Differential     Status: Abnormal   Collection Time: 05/06/14 11:43 PM  Result Value Ref Range   WBC 11.4 (H) 4.0 - 10.5 K/uL   RBC 3.55 (L) 3.87 - 5.11 MIL/uL   Hemoglobin 10.5 (L) 12.0 - 15.0 g/dL   HCT 31.8 (L) 36.0 - 46.0 %   MCV 89.6 78.0 - 100.0 fL   MCH 29.6 26.0 - 34.0 pg   MCHC 33.0 30.0 - 36.0 g/dL   RDW 13.3 11.5 - 15.5 %   Platelets 239 150 - 400 K/uL   Neutrophils Relative % 81 (H) 43 - 77 %   Neutro Abs 9.2 (H) 1.7 - 7.7 K/uL   Lymphocytes Relative 13 12 - 46 %   Lymphs Abs 1.5 0.7 - 4.0 K/uL   Monocytes Relative 6 3 - 12 %   Monocytes Absolute 0.7 0.1 - 1.0 K/uL   Eosinophils Relative 0 0 - 5 %   Eosinophils Absolute 0.0 0.0 - 0.7 K/uL   Basophils Relative 0 0 - 1 %   Basophils Absolute  0.0 0.0 - 0.1 K/uL  Blood culture (routine x 2)     Status: None (Preliminary result)   Collection Time: 05/06/14 11:51 PM  Result Value Ref Range   Specimen Description BLOOD RIGHT ANTECUBITAL    Special Requests BOTTLES DRAWN AEROBIC AND ANAEROBIC 6CC EACH    Culture NO GROWTH <24 HRS    Report Status PENDING   Blood culture (routine x 2)     Status: None (Preliminary result)   Collection Time: 05/06/14 11:56 PM  Result Value Ref Range   Specimen Description BLOOD RIGHT ARM    Special Requests BOTTLES DRAWN AEROBIC AND ANAEROBIC 10CC EACH    Culture NO GROWTH <24 HRS    Report Status PENDING   Urinalysis, Routine w reflex microscopic     Status: Abnormal   Collection Time: 05/07/14 12:38 AM  Result Value Ref Range   Color, Urine YELLOW YELLOW   APPearance CLEAR CLEAR   Specific Gravity, Urine >1.030 (H) 1.005 - 1.030   pH 6.0 5.0 - 8.0   Glucose, UA NEGATIVE NEGATIVE mg/dL   Hgb urine dipstick NEGATIVE NEGATIVE   Bilirubin Urine NEGATIVE NEGATIVE   Ketones, ur NEGATIVE NEGATIVE mg/dL   Protein, ur TRACE (A) NEGATIVE mg/dL    Urobilinogen, UA 0.2 0.0 - 1.0 mg/dL   Nitrite NEGATIVE NEGATIVE   Leukocytes, UA TRACE (A) NEGATIVE  Urine microscopic-add on     Status: Abnormal   Collection Time: 05/07/14 12:38 AM  Result Value Ref Range   Squamous Epithelial / LPF FEW (A) RARE   WBC, UA 3-6 <3 WBC/hpf   RBC / HPF 0-2 <3 RBC/hpf   Bacteria, UA MANY (A) RARE   Urine-Other MUCOUS PRESENT   Comprehensive metabolic panel     Status: Abnormal   Collection Time: 05/07/14  3:49 AM  Result Value Ref Range   Sodium 143 137 - 147 mEq/L   Potassium 3.7 3.7 - 5.3 mEq/L   Chloride 103 96 - 112 mEq/L   CO2 25 19 - 32 mEq/L   Glucose, Bld 108 (H) 70 - 99 mg/dL   BUN 20 6 - 23 mg/dL   Creatinine, Ser 0.99 0.50 - 1.10 mg/dL   Calcium 9.0 8.4 - 10.5 mg/dL   Total Protein 7.5 6.0 - 8.3 g/dL   Albumin 2.6 (L) 3.5 - 5.2 g/dL   AST 27 0 - 37 U/L   ALT 17 0 - 35 U/L   Alkaline Phosphatase 88 39 - 117 U/L   Total Bilirubin 0.4 0.3 - 1.2 mg/dL   GFR calc non Af Amer 54 (L) >90 mL/min   GFR calc Af Amer 62 (L) >90 mL/min    Comment: (NOTE) The eGFR has been calculated using the CKD EPI equation. This calculation has not been validated in all clinical situations. eGFR's persistently <90 mL/min signify possible Chronic Kidney Disease.    Anion gap 15 5 - 15  CBC WITH DIFFERENTIAL     Status: Abnormal   Collection Time: 05/07/14  3:49 AM  Result Value Ref Range   WBC 11.8 (H) 4.0 - 10.5 K/uL   RBC 3.60 (L) 3.87 - 5.11 MIL/uL   Hemoglobin 10.6 (L) 12.0 - 15.0 g/dL   HCT 32.4 (L) 36.0 - 46.0 %   MCV 90.0 78.0 - 100.0 fL   MCH 29.4 26.0 - 34.0 pg   MCHC 32.7 30.0 - 36.0 g/dL   RDW 13.3 11.5 - 15.5 %   Platelets 230 150 - 400 K/uL  Neutrophils Relative % 81 (H) 43 - 77 %   Neutro Abs 9.6 (H) 1.7 - 7.7 K/uL   Lymphocytes Relative 14 12 - 46 %   Lymphs Abs 1.6 0.7 - 4.0 K/uL   Monocytes Relative 5 3 - 12 %   Monocytes Absolute 0.6 0.1 - 1.0 K/uL   Eosinophils Relative 0 0 - 5 %   Eosinophils Absolute 0.0 0.0 - 0.7 K/uL    Basophils Relative 0 0 - 1 %   Basophils Absolute 0.0 0.0 - 0.1 K/uL  Sedimentation rate     Status: Abnormal   Collection Time: 05/07/14  3:54 AM  Result Value Ref Range   Sed Rate 70 (H) 0 - 22 mm/hr  C-reactive protein     Status: Abnormal   Collection Time: 05/07/14  3:54 AM  Result Value Ref Range   CRP 3.2 (H) <0.60 mg/dL    Comment: Performed at [facility]  Ammonia     Status: None   Collection Time: 05/07/14  3:54 AM  Result Value Ref Range   Ammonia 20 11 - 60 umol/L  MRSA PCR Screening     Status: Abnormal   Collection Time: 05/07/14  4:50 AM  Result Value Ref Range   MRSA by PCR POSITIVE (A) NEGATIVE    Comment: CRITICAL RESULT CALLED TO, READ BACK BY AND VERIFIED WITH: FERGUSON,M AT 10:20AM ON 05/07/14 BY FESTERMAN,C        The GeneXpert MRSA Assay (FDA approved for NASAL specimens only), is one component of a comprehensive MRSA colonization surveillance program. It is not intended to diagnose MRSA infection nor to guide or monitor treatment for MRSA infections.   Creatinine, serum     Status: None   Collection Time: 05/07/14  5:00 AM  Result Value Ref Range   GFR calc non Af Amer NOT CALCULATED >90 mL/min   GFR calc Af Amer NOT CALCULATED >90 mL/min    Comment: (NOTE) The eGFR has been calculated using the CKD EPI equation. This calculation has not been validated in all clinical situations. eGFR's persistently <90 mL/min signify possible Chronic Kidney Disease.   Clostridium Difficile by PCR     Status: Abnormal   Collection Time: 05/07/14 11:39 PM  Result Value Ref Range   C difficile by pcr POSITIVE (A) NEGATIVE    Comment: CRITICAL RESULT CALLED TO, READ BACK BY AND VERIFIED WITH: Rockne Coons AT 0123 ON [id number] BY FORSYTH K   Comprehensive metabolic panel     Status: Abnormal   Collection Time: 05/08/14  6:10 AM  Result Value Ref Range   Sodium 144 137 - 147 mEq/L   Potassium 3.3 (L) 3.7 - 5.3 mEq/L   Chloride 106 96 - 112 mEq/L   CO2 25  19 - 32 mEq/L   Glucose, Bld 73 70 - 99 mg/dL   BUN 16 6 - 23 mg/dL   Creatinine, Ser 1.01 0.50 - 1.10 mg/dL   Calcium 9.0 8.4 - 10.5 mg/dL   Total Protein 7.0 6.0 - 8.3 g/dL   Albumin 2.5 (L) 3.5 - 5.2 g/dL   AST 28 0 - 37 U/L   ALT 15 0 - 35 U/L   Alkaline Phosphatase 84 39 - 117 U/L   Total Bilirubin 0.5 0.3 - 1.2 mg/dL   GFR calc non Af Amer 52 (L) >90 mL/min   GFR calc Af Amer 61 (L) >90 mL/min    Comment: (NOTE) The eGFR has been calculated using the CKD EPI  equation. This calculation has not been validated in all clinical situations. eGFR's persistently <90 mL/min signify possible Chronic Kidney Disease.    Anion gap 13 5 - 15  CBC WITH DIFFERENTIAL     Status: Abnormal   Collection Time: 05/08/14  6:10 AM  Result Value Ref Range   WBC 8.5 4.0 - 10.5 K/uL   RBC 3.23 (L) 3.87 - 5.11 MIL/uL   Hemoglobin 9.4 (L) 12.0 - 15.0 g/dL   HCT 28.8 (L) 36.0 - 46.0 %   MCV 89.2 78.0 - 100.0 fL   MCH 29.1 26.0 - 34.0 pg   MCHC 32.6 30.0 - 36.0 g/dL   RDW 13.3 11.5 - 15.5 %   Platelets 210 150 - 400 K/uL   Neutrophils Relative % 68 43 - 77 %   Neutro Abs 5.7 1.7 - 7.7 K/uL   Lymphocytes Relative 25 12 - 46 %   Lymphs Abs 2.1 0.7 - 4.0 K/uL   Monocytes Relative 6 3 - 12 %   Monocytes Absolute 0.5 0.1 - 1.0 K/uL   Eosinophils Relative 1 0 - 5 %   Eosinophils Absolute 0.1 0.0 - 0.7 K/uL   Basophils Relative 0 0 - 1 %   Basophils Absolute 0.0 0.0 - 0.1 K/uL    ABGS No results for input(s): PHART, PO2ART, TCO2, HCO3 in the last 72 hours.  Invalid input(s): PCO2 CULTURES Recent Results (from the past 240 hour(s))  Blood culture (routine x 2)     Status: None (Preliminary result)   Collection Time: 05/06/14 11:51 PM  Result Value Ref Range Status   Specimen Description BLOOD RIGHT ANTECUBITAL  Final   Special Requests BOTTLES DRAWN AEROBIC AND ANAEROBIC [place]  Final   Culture NO GROWTH <24 HRS  Final   Report Status PENDING  Incomplete  Blood culture (routine x 2)      Status: None (Preliminary result)   Collection Time: 05/06/14 11:56 PM  Result Value Ref Range Status   Specimen Description BLOOD RIGHT ARM  Final   Special Requests BOTTLES DRAWN AEROBIC AND ANAEROBIC 10CC EACH  Final   Culture NO GROWTH <24 HRS  Final   Report Status PENDING  Incomplete  MRSA PCR Screening     Status: Abnormal   Collection Time: 05/07/14  4:50 AM  Result Value Ref Range Status   MRSA by PCR POSITIVE (A) NEGATIVE Final    Comment: CRITICAL RESULT CALLED TO, READ BACK BY AND VERIFIED WITH: FERGUSON,M AT 10:20AM ON 05/07/14 BY FESTERMAN,C        The GeneXpert MRSA Assay (FDA approved for NASAL specimens only), is one component of a comprehensive MRSA colonization surveillance program. It is not intended to diagnose MRSA infection nor to guide or monitor treatment for MRSA infections.   Clostridium Difficile by PCR     Status: Abnormal   Collection Time: 05/07/14 11:39 PM  Result Value Ref Range Status   C difficile by pcr POSITIVE (A) NEGATIVE Final    Comment: CRITICAL RESULT CALLED TO, READ BACK BY AND VERIFIED WITH: Rockne Coons AT 0123 ON [id number] BY FORSYTH K    Studies/Results: Dg Chest 2 View  05/07/2014   CLINICAL DATA:  Back pain  EXAM: CHEST  2 VIEW  COMPARISON:  04/18/2014  FINDINGS: No cardiomegaly. Negative aortic and hilar contours. There is no edema, consolidation, effusion, or pneumothorax. Typical nipple shadow at the left base. No parenchymal nodule seen on recent chest imaging.  IMPRESSION: No active cardiopulmonary disease.  Electronically Signed   By: Jorje Guild M.D.   On: 05/07/2014 00:44   Ct Head Wo Contrast  05/07/2014   CLINICAL DATA:  Initial evaluation for acute confusion.  EXAM: CT HEAD WITHOUT CONTRAST  TECHNIQUE: Contiguous axial images were obtained from the base of the skull through the vertex without intravenous contrast.  COMPARISON:  Prior study from 04/09/2014.  FINDINGS: Scattered hypodensity within the periventricular and  deep white matter both cerebral hemispheres is most consistent with mild chronic small vessel ischemic changes. Prominent vascular calcifications present within the carotid siphons bilaterally.  There is no acute intracranial hemorrhage or infarct. No mass lesion or midline shift. Gray-white matter differentiation is well maintained. Ventricles are normal in size without evidence of hydrocephalus. CSF containing spaces are within normal limits. No extra-axial fluid collection.  The calvarium is intact.  Orbital soft tissues are within normal limits.  Scattered mucoperiosteal thickening present within the sphenoid sinuses and ethmoidal air cells bilaterally. No mastoid effusion.  Scalp soft tissues are unremarkable.  IMPRESSION: 1. No acute intracranial process. 2. Mild chronic small vessel ischemic disease. 3. Mild bilateral sphenoid and ethmoidal sinus disease.   Electronically Signed   By: Jeannine Boga M.D.   On: 05/07/2014 03:16   Ct Lumbar Spine Wo Contrast  05/07/2014   CLINICAL DATA:  One month of back pain and generalized weakness. History of recent falls. Initial encounter  EXAM: CT LUMBAR SPINE WITHOUT CONTRAST  TECHNIQUE: Multidetector CT imaging of the lumbar spine was performed without intravenous contrast administration. Multiplanar CT image reconstructions were also generated.  COMPARISON:  Abdominal CT 12/31/2008  FINDINGS: There is focal disc narrowing at L5-S1 with endplate erosions and sclerosis. These changes have developed since 2010. The surrounding soft tissues are infiltrated and there is ventral epidural thickening. Findings are highly concerning for discitis/osteomyelitis with epidural infection.  No acute fracture.  Osteopenia.  Diffuse degenerative disc bulging which mildly narrows the bilateral foramina. Disc bulge at L3-4 deforms the ventral thecal sac.  IMPRESSION: Findings consistent with L5-S1 discitis/osteomyelitis, likely with ventral epidural infection. Recommend lumbar  spine MRI with contrast.   Electronically Signed   By: Jorje Guild M.D.   On: 05/07/2014 03:08    Medications: I have reviewed the patient's current medications.  Assesment: Active Problems:   Sepsis   Encephalopathy   Back pain COPD C.diffici colitis  Plan: Medications reviewed Will continue combinations of antibiotics Will add flagyl 500 mg po TID Will advance diet as tolerated. Continue supportive care.    LOS: 2 days   Lisa [name] 05/08/2014, 12:10 PM

## 2014-05-08 NOTE — Evaluation (Signed)
Physical Therapy Evaluation Patient Details Name: Lisa [name] MRN: [id number] DOB: 1934-10-18 Today's Date: 05/08/2014   History of Present Illness  Lisa [name] is a 79 y.o. female who presents to the Emergency Department complaining of 1 month of back pain and generalized weakness; according to family, she is "unable to walk". Family reports that patient was having back pain after a recent strain 1 month PTA; patient was seen at [facility] and was told that she had a "blood infection" (admitted for 2 weeks). She was then placed at [facility] (21 days) for rehab; patient was released yesterday. Family denies cough or new, concerning falls (patient regularly "falls out of bed").  Clinical Impression   Pt was seen for evaluation.  She was alert, pleasant and cooperative but disoriented to place, time and situation.  She reported NO back pain and did not show any evidence of pain with mobility.  Her LE strength is now WNL but she has poor endurance and tires easily.  This combined with confusion makes her a very high fall risk.  Her home setting in unclear to me.  She states that her husband had a stroke 3 weeks ago and cannot care for her.  She has no one in the home to assist by her report.  I am recommending return to SNF at d/c.    Follow Up Recommendations SNF    Equipment Recommendations  None recommended by PT    Recommendations for Other Services   none    Precautions / Restrictions Precautions Precautions: Fall Restrictions Weight Bearing Restrictions: No      Mobility  Bed Mobility Overal bed mobility: Needs Assistance Bed Mobility: Supine to Sit Rolling: Modified independent (Device/Increase time)   Supine to sit: Supervision        Transfers Overall transfer level: Needs assistance Equipment used: Rolling walker (2 wheeled) Transfers: Sit to/from Stand Sit to Stand: Min assist            Ambulation/Gait Ambulation/Gait assistance: Min assist Ambulation  Distance (Feet): 75 Feet Assistive device: Rolling walker (2 wheeled) Gait Pattern/deviations: Narrow base of support Gait velocity: toward end of gait, knees tend to buckle due to poor endurance, needs guidance to avoid obstacles Gait velocity interpretation: Below normal speed for age/gender    Stairs            Wheelchair Mobility    Modified Rankin (Stroke Patients Only)       Balance Overall balance assessment: Needs assistance Sitting-balance support: No upper extremity supported;Feet supported Sitting balance-Leahy Scale: Good     Standing balance support: Bilateral upper extremity supported Standing balance-Leahy Scale: Fair                               Pertinent Vitals/Pain Pain Assessment: No/denies pain    Home Living Family/patient expects to be discharged to:: Skilled nursing facility                      Prior Function           Comments: recently discharged from [facility] and functional status is unknown     Hand Dominance        Extremity/Trunk Assessment   Upper Extremity Assessment: Overall WFL for tasks assessed           Lower Extremity Assessment: Overall WFL for tasks assessed RLE Deficits / Details: endurance is only fair LLE Deficits /  Details: endurance is only fair  Cervical / Trunk Assessment: Normal  Communication   Communication: No difficulties  Cognition Arousal/Alertness: Awake/alert Behavior During Therapy: WFL for tasks assessed/performed Overall Cognitive Status: No family/caregiver present to determine baseline cognitive functioning   Orientation Level: Disoriented to;Place;Time;Situation Current Attention Level: Sustained Memory: Decreased short-term memory Following Commands: Follows one step commands inconsistently Safety/Judgement: Decreased awareness of safety   Problem Solving: Slow processing;Requires verbal cues      General Comments      Exercises         Assessment/Plan    PT Assessment Patient needs continued PT services  PT Diagnosis Difficulty walking;Abnormality of gait;Generalized weakness   PT Problem List Decreased strength;Decreased activity tolerance;Decreased balance;Decreased mobility  PT Treatment Interventions Gait training;Functional mobility training;Therapeutic exercise;Balance training   PT Goals (Current goals can be found in the Care Plan section) Acute Rehab PT Goals Patient Stated Goal: none stated PT Goal Formulation: Patient unable to participate in goal setting Time For Goal Achievement: 05/22/14 Potential to Achieve Goals: Good    Frequency Min 3X/week   Barriers to discharge Decreased caregiver support      Co-evaluation               End of Session Equipment Utilized During Treatment: Gait belt Activity Tolerance: Patient limited by fatigue Patient left: in chair;with call bell/phone within reach;with chair alarm set Nurse Communication: Mobility status         Time: 6283-6629 PT Time Calculation (min) (ACUTE ONLY): 44 min   Charges:   PT Evaluation $Initial PT Evaluation Tier I: 1 Procedure     PT G CodesDemetrios [name] L 05/08/2014, 9:24 AM

## 2014-05-08 NOTE — Clinical Social Work Placement (Signed)
Clinical Social Work Department CLINICAL SOCIAL WORK PLACEMENT NOTE 05/08/2014  Patient:  Lisa [name], Lisa [name]  Account Number:  [id number] Admit date:  05/06/2014  Clinical Social Worker:  Daiva Huge  Date/time:  05/08/2014 04:08 PM  Clinical Social Work is seeking post-discharge placement for this patient at the following level of care:   SKILLED NURSING   (*CSW will update this form in Epic as items are completed)   05/08/2014  Patient/family provided with [place] Department of Clinical Social Work's list of facilities offering this level of care within the geographic area requested by the patient (or if unable, by the patient's family).  05/08/2014  Patient/family informed of their freedom to choose among providers that offer the needed level of care, that participate in Medicare, Medicaid or managed care program needed by the patient, have an available bed and are willing to accept the patient.  05/08/2014  Patient/family informed of [facility]' ownership interest in [facility], as well as of the fact that they are under no obligation to receive care at this facility.  PASARR submitted to EDS on 05/08/2014 PASARR number received on 05/08/2014  FL2 transmitted to all facilities in geographic area requested by pt/family on  05/08/2014 FL2 transmitted to all facilities within larger geographic area on   Patient informed that his/her managed care company has contracts with or will negotiate with  certain facilities, including the following:     Patient/family informed of bed offers received:   Patient chooses bed at  Physician recommends and patient chooses bed at    Patient to be transferred to  on   Patient to be transferred to facility by  Patient and family notified of transfer on  Name of family member notified:    The following physician request were entered in Epic:   Additional Comments: Eduard Clos, MSW, [place]

## 2014-05-08 NOTE — Progress Notes (Addendum)
INITIAL NUTRITION ASSESSMENT  DOCUMENTATION CODES Per approved criteria  -Severe malnutrition in the context of acute illness- -Underweight  Pt meets criteria for severe MALNUTRITION in the context of acute illness as evidenced by 15% wt loss in 1 months and moderate muscle wasting to clavicles, patella and anterior thigh regions .  INTERVENTIONS: Ensure Complete po BID, each supplement provides 350 kcal and 13 grams of protein   MVI  NUTRITION DIAGNOSIS: Inadequate oral intake related to UTI, C. Diff. as evidenced by 14% wt loss in 1 month and moderate muscle wasting.  Goal: Pt to meet >/= 90% of their estimated nutrition needs    Monitor: Diet advancement, po intake, labs and wt trends    Reason for Assessment: Malnutrition Screen   79 y.o. female  Admitting Dx: sepsis, UTI, encephalopathy    ASSESSMENT:  Pt has recent hx of bacteremia, moderate malnutrition, dehydration and falls. She was recently released from [facility].  PT is recommending return to SNF at discharge.   Pt has safety mitts on during visit. She is pleasant but unable to provide details of usual food and beverage intake. Severe weight loss noted this month related to recent infection?? Worsening malnutrition with additional significant, unplanned weight loss, muscle wasting and decreased fat mass.   IVF-NS @ 100 ml/hr Labs: potassium 3.3 low  Nutrition Focused Physical Exam:  Subcutaneous Fat:  Orbital Region: mild depletion Upper Arm Region: moderate depletion Thoracic and Lumbar Region: moderate depletion  Muscle:  Temple Region: mild wasting Clavicle Bone Region: severe wasting Clavicle and Acromion Bone Region: severe wasting Scapular Bone Region: moderate wasting Dorsal Hand: unable to assess (pt has on mitts) Patellar Region: severe wasting Anterior Thigh Region: moderate-severe wasting Posterior Calf Region: moderate wasting  Edema: none   Height: Ht Readings from Last 1 Encounters:   05/07/14 5\' 7"  (1.702 m)    Weight: Wt Readings from Last 1 Encounters:  05/08/14 103 lb 6.3 oz (46.9 kg)  11/25-admit wt 107#   Ideal Body Weight: 135#  % Ideal Body Weight: 76%  Wt Readings from Last 10 Encounters:  05/08/14 103 lb 6.3 oz (46.9 kg)  04/17/14 120 lb (54.432 kg)  04/15/14 116 lb 13.5 oz (53 kg)  08/11/11 135 lb (61.236 kg)  02/12/11 135 lb (61.236 kg)    Usual Body Weight: 115-120#  % Usual Body Weight: 86%  BMI:  Body mass index is 16.19 kg/(m^2). underweight   Estimated Nutritional Needs: Kcal: 1410-1600 Protein: 60-70 gr Fluid: 1.4-1.6 liters daily  Skin: intact  Diet Order: Regular  EDUCATION NEEDS: -No education needs identified at this time   Intake/Output Summary (Last 24 hours) at 05/08/14 1103 Last data filed at 05/08/14 1030  Gross per 24 hour  Intake      0 ml  Output      7 ml  Net     -7 ml    Last BM: 11/26   Labs:   Recent Labs Lab 05/06/14 2343 05/07/14 0349 05/08/14 0610  NA 141 143 144  K 4.1 3.7 3.3*  CL 102 103 106  CO2 27 25 25   BUN 21 20 16   CREATININE 1.01 0.99 1.01  CALCIUM 9.4 9.0 9.0  GLUCOSE 100* 108* 73    CBG (last 3)  No results for input(s): GLUCAP in the last 72 hours.  Scheduled Meds: . Chlorhexidine Gluconate Cloth  6 each Topical Q0600  . diltiazem  120 mg Oral Daily  . feeding supplement (ENSURE COMPLETE)  237 mL Oral  BID BM  . heparin  5,000 Units Subcutaneous 3 times per day  . ipratropium-albuterol  3 mL Nebulization TID  . labetalol  300 mg Oral BID  . multivitamin with minerals  1 tablet Oral Daily  . mupirocin ointment  1 application Nasal BID  . piperacillin-tazobactam (ZOSYN)  IV  3.375 g Intravenous Q8H  . vancomycin  750 mg Intravenous Q24H    Continuous Infusions: . sodium chloride 100 mL/hr at 05/07/14 1753    Past Medical History  Diagnosis Date  . COPD (chronic obstructive pulmonary disease)   . Hypertension   . Pyelonephritis 04/06/2014    UTI    Past  Surgical History  Procedure Laterality Date  . Abdominal hysterectomy    . Tee without cardioversion N/A 04/10/2014    Procedure: TRANSESOPHAGEAL ECHOCARDIOGRAM (TEE);  Surgeon: [name], MD;  Location: [facility];  Service: Cardiovascular;  Laterality: N/A;    Colman Cater MS,RD,CSG,LDN Office: [phone] Pager: [phone]

## 2014-05-08 NOTE — Progress Notes (Signed)
Notified Dr. Legrand Rams and asked the patients diet could be advanced since although she is still confused she appears alert enough to eat.  She swallows her medications without difficulty AEB no cough/choking and lungs are clear after administration of medications.  Also I notified him that the patients MRSA nasal swab was back with positive results.  Voiced to him that the patient was on vanc/zosyn.  He stated that the vanc was enough for now.  New orders given for diet and followed.

## 2014-05-08 NOTE — Clinical Social Work Psychosocial (Deleted)
Clinical Social Work Department BRIEF PSYCHOSOCIAL ASSESSMENT 05/08/2014  Patient:  Lisa [name], Lisa [name]     Account Number:  [id number]     Admit date:  05/06/2014  Clinical Social Worker:  Daiva Huge  Date/Time:  05/08/2014 03:52 PM  Referred by:  Physician  Date Referred:  05/08/2014 Referred for  SNF Placement   Other Referral:   Interview type:  Patient Other interview type:   ALSO SPOKE WITH HUSBAND BY PHONE    PSYCHOSOCIAL DATA Living Status:  FAMILY Admitted from facility:   Level of care:   Primary support name:  HUSBAND Primary support relationship to patient:  FAMILY Degree of support available:   MINIMAL    CURRENT CONCERNS Current Concerns  Post-Acute Placement   Other Concerns:    SOCIAL WORK ASSESSMENT / PLAN Spoke with pateint who tells me she lives with her husband of 19 years locally. She agrees to considering SNF placement and apparently was just released from [facility] earlier this week- she is agreeable to considering SNF again- CSW will proceed with this plan.   Assessment/plan status:  Other - See comment Other assessment/ plan:   FL2 and PASARR for SNF   Information/referral to community resources:    PATIENT'S/FAMILY'S RESPONSE TO PLAN OF CARE: Patient agreeable to SNF search, stating, "I think it would be a good idea'. Her husband, was hesitant to this plan and wants to talk to other family about plans- With patient's permission, CSW will pursue SNF options in case SNF is needed and desired.       Eduard Clos, MSW, [place]

## 2014-05-09 DIAGNOSIS — E43 Unspecified severe protein-calorie malnutrition: Secondary | ICD-10-CM | POA: Insufficient documentation

## 2014-05-09 LAB — CBC WITH DIFFERENTIAL/PLATELET
BASOS ABS: 0 10*3/uL (ref 0.0–0.1)
BASOS PCT: 0 % (ref 0–1)
Eosinophils Absolute: 0.2 10*3/uL (ref 0.0–0.7)
Eosinophils Relative: 2 % (ref 0–5)
HCT: 29 % — ABNORMAL LOW (ref 36.0–46.0)
Hemoglobin: 9.4 g/dL — ABNORMAL LOW (ref 12.0–15.0)
Lymphocytes Relative: 28 % (ref 12–46)
Lymphs Abs: 2.2 10*3/uL (ref 0.7–4.0)
MCH: 28.8 pg (ref 26.0–34.0)
MCHC: 32.4 g/dL (ref 30.0–36.0)
MCV: 89 fL (ref 78.0–100.0)
MONOS PCT: 6 % (ref 3–12)
Monocytes Absolute: 0.5 10*3/uL (ref 0.1–1.0)
NEUTROS ABS: 4.9 10*3/uL (ref 1.7–7.7)
NEUTROS PCT: 64 % (ref 43–77)
PLATELETS: 229 10*3/uL (ref 150–400)
RBC: 3.26 MIL/uL — ABNORMAL LOW (ref 3.87–5.11)
RDW: 13.4 % (ref 11.5–15.5)
WBC: 7.6 10*3/uL (ref 4.0–10.5)

## 2014-05-09 LAB — COMPREHENSIVE METABOLIC PANEL
ALK PHOS: 87 U/L (ref 39–117)
ALT: 15 U/L (ref 0–35)
ANION GAP: 15 (ref 5–15)
AST: 25 U/L (ref 0–37)
Albumin: 2.7 g/dL — ABNORMAL LOW (ref 3.5–5.2)
BUN: 20 mg/dL (ref 6–23)
CO2: 25 mEq/L (ref 19–32)
Calcium: 9 mg/dL (ref 8.4–10.5)
Chloride: 103 mEq/L (ref 96–112)
Creatinine, Ser: 1.14 mg/dL — ABNORMAL HIGH (ref 0.50–1.10)
GFR, EST AFRICAN AMERICAN: 52 mL/min — AB (ref >90)
GFR, EST NON AFRICAN AMERICAN: 45 mL/min — AB (ref >90)
GLUCOSE: 105 mg/dL — AB (ref 70–99)
POTASSIUM: 3.2 meq/L — AB (ref 3.7–5.3)
Sodium: 143 mEq/L (ref 137–147)
TOTAL PROTEIN: 7.5 g/dL (ref 6.0–8.3)
Total Bilirubin: 0.3 mg/dL (ref 0.3–1.2)

## 2014-05-09 NOTE — Progress Notes (Signed)
Subjective: Patient is alertand awake but remained confused and disoriented. No fever or chills.  Objective: Vital signs in last 24 hours: Temp:  [97.9 F (36.6 C)-98.6 F (37 C)] 98.4 F (36.9 C) (11/28 0702) Pulse Rate:  [61-74] 72 (11/28 1102) Resp:  [19-20] 20 (11/28 0702) BP: (104-172)/(36-65) 167/63 mmHg (11/28 0702) SpO2:  [96 %-100 %] 100 % (11/28 0702) Weight change:  Last BM Date: 05/08/14  Intake/Output from previous day: 11/27 0701 - 11/28 0700 In: 440 [P.O.:440] Out: 138 [Urine:120; Stool:18]  PHYSICAL EXAM General appearance: alert, fatigued and no distress Resp: diminished breath sounds bilaterally and rhonchi bilaterally Cardio: S1, S2 normal GI: soft, non-tender; bowel sounds normal; no masses,  no organomegaly Extremities: extremities normal, atraumatic, no cyanosis or edema  Lab Results:  Results for orders placed or performed during the hospital encounter of 05/06/14 (from the past 48 hour(s))  Clostridium Difficile by PCR     Status: Abnormal   Collection Time: 05/07/14 11:39 PM  Result Value Ref Range   C difficile by pcr POSITIVE (A) NEGATIVE    Comment: CRITICAL RESULT CALLED TO, READ BACK BY AND VERIFIED WITH: Rockne Coons AT 0123 ON [id number] BY FORSYTH K   Comprehensive metabolic panel     Status: Abnormal   Collection Time: 05/08/14  6:10 AM  Result Value Ref Range   Sodium 144 137 - 147 mEq/L   Potassium 3.3 (L) 3.7 - 5.3 mEq/L   Chloride 106 96 - 112 mEq/L   CO2 25 19 - 32 mEq/L   Glucose, Bld 73 70 - 99 mg/dL   BUN 16 6 - 23 mg/dL   Creatinine, Ser 1.01 0.50 - 1.10 mg/dL   Calcium 9.0 8.4 - 10.5 mg/dL   Total Protein 7.0 6.0 - 8.3 g/dL   Albumin 2.5 (L) 3.5 - 5.2 g/dL   AST 28 0 - 37 U/L   ALT 15 0 - 35 U/L   Alkaline Phosphatase 84 39 - 117 U/L   Total Bilirubin 0.5 0.3 - 1.2 mg/dL   GFR calc non Af Amer 52 (L) >90 mL/min   GFR calc Af Amer 61 (L) >90 mL/min    Comment: (NOTE) The eGFR has been calculated using the CKD EPI  equation. This calculation has not been validated in all clinical situations. eGFR's persistently <90 mL/min signify possible Chronic Kidney Disease.    Anion gap 13 5 - 15  CBC WITH DIFFERENTIAL     Status: Abnormal   Collection Time: 05/08/14  6:10 AM  Result Value Ref Range   WBC 8.5 4.0 - 10.5 K/uL   RBC 3.23 (L) 3.87 - 5.11 MIL/uL   Hemoglobin 9.4 (L) 12.0 - 15.0 g/dL   HCT 28.8 (L) 36.0 - 46.0 %   MCV 89.2 78.0 - 100.0 fL   MCH 29.1 26.0 - 34.0 pg   MCHC 32.6 30.0 - 36.0 g/dL   RDW 13.3 11.5 - 15.5 %   Platelets 210 150 - 400 K/uL   Neutrophils Relative % 68 43 - 77 %   Neutro Abs 5.7 1.7 - 7.7 K/uL   Lymphocytes Relative 25 12 - 46 %   Lymphs Abs 2.1 0.7 - 4.0 K/uL   Monocytes Relative 6 3 - 12 %   Monocytes Absolute 0.5 0.1 - 1.0 K/uL   Eosinophils Relative 1 0 - 5 %   Eosinophils Absolute 0.1 0.0 - 0.7 K/uL   Basophils Relative 0 0 - 1 %   Basophils Absolute 0.0 0.0 -  0.1 K/uL  Comprehensive metabolic panel     Status: Abnormal   Collection Time: 05/09/14  6:26 AM  Result Value Ref Range   Sodium 143 137 - 147 mEq/L   Potassium 3.2 (L) 3.7 - 5.3 mEq/L   Chloride 103 96 - 112 mEq/L   CO2 25 19 - 32 mEq/L   Glucose, Bld 105 (H) 70 - 99 mg/dL   BUN 20 6 - 23 mg/dL   Creatinine, Ser 1.14 (H) 0.50 - 1.10 mg/dL   Calcium 9.0 8.4 - 10.5 mg/dL   Total Protein 7.5 6.0 - 8.3 g/dL   Albumin 2.7 (L) 3.5 - 5.2 g/dL   AST 25 0 - 37 U/L   ALT 15 0 - 35 U/L   Alkaline Phosphatase 87 39 - 117 U/L   Total Bilirubin 0.3 0.3 - 1.2 mg/dL   GFR calc non Af Amer 45 (L) >90 mL/min   GFR calc Af Amer 52 (L) >90 mL/min    Comment: (NOTE) The eGFR has been calculated using the CKD EPI equation. This calculation has not been validated in all clinical situations. eGFR's persistently <90 mL/min signify possible Chronic Kidney Disease.    Anion gap 15 5 - 15  CBC WITH DIFFERENTIAL     Status: Abnormal   Collection Time: 05/09/14  6:26 AM  Result Value Ref Range   WBC 7.6 4.0 - 10.5  K/uL    Comment: WHITE COUNT CONFIRMED ON SMEAR   RBC 3.26 (L) 3.87 - 5.11 MIL/uL   Hemoglobin 9.4 (L) 12.0 - 15.0 g/dL   HCT 29.0 (L) 36.0 - 46.0 %   MCV 89.0 78.0 - 100.0 fL   MCH 28.8 26.0 - 34.0 pg   MCHC 32.4 30.0 - 36.0 g/dL   RDW 13.4 11.5 - 15.5 %   Platelets 229 150 - 400 K/uL    Comment: SPECIMEN CHECKED FOR CLOTS PLATELET COUNT CONFIRMED BY SMEAR    Neutrophils Relative % 64 43 - 77 %   Neutro Abs 4.9 1.7 - 7.7 K/uL   Lymphocytes Relative 28 12 - 46 %   Lymphs Abs 2.2 0.7 - 4.0 K/uL   Monocytes Relative 6 3 - 12 %   Monocytes Absolute 0.5 0.1 - 1.0 K/uL   Eosinophils Relative 2 0 - 5 %   Eosinophils Absolute 0.2 0.0 - 0.7 K/uL   Basophils Relative 0 0 - 1 %   Basophils Absolute 0 0.0 - 0.1 K/uL   WBC Morphology ATYPICAL LYMPHOCYTES     ABGS No results for input(s): PHART, PO2ART, TCO2, HCO3 in the last 72 hours.  Invalid input(s): PCO2 CULTURES Recent Results (from the past 240 hour(s))  Blood culture (routine x 2)     Status: None (Preliminary result)   Collection Time: 05/06/14 11:51 PM  Result Value Ref Range Status   Specimen Description BLOOD RIGHT ANTECUBITAL  Final   Special Requests BOTTLES DRAWN AEROBIC AND ANAEROBIC [place]  Final   Culture NO GROWTH 2 DAYS  Final   Report Status PENDING  Incomplete  Blood culture (routine x 2)     Status: None (Preliminary result)   Collection Time: 05/06/14 11:56 PM  Result Value Ref Range Status   Specimen Description BLOOD RIGHT ARM  Final   Special Requests BOTTLES DRAWN AEROBIC AND ANAEROBIC 10CC EACH  Final   Culture NO GROWTH 2 DAYS  Final   Report Status PENDING  Incomplete  Urine culture     Status: None  Collection Time: 05/07/14 12:38 AM  Result Value Ref Range Status   Specimen Description URINE, CATHETERIZED  Final   Special Requests NONE  Final   Culture  Setup Time   Final    05/07/2014 21:41 Performed at [facility] Performed at [facility]    Final   Culture NO GROWTH Performed at [facility]   Final   Report Status 05/08/2014 FINAL  Final  MRSA PCR Screening     Status: Abnormal   Collection Time: 05/07/14  4:50 AM  Result Value Ref Range Status   MRSA by PCR POSITIVE (A) NEGATIVE Final    Comment: CRITICAL RESULT CALLED TO, READ BACK BY AND VERIFIED WITH: FERGUSON,M AT 10:20AM ON 05/07/14 BY FESTERMAN,C        The GeneXpert MRSA Assay (FDA approved for NASAL specimens only), is one component of a comprehensive MRSA colonization surveillance program. It is not intended to diagnose MRSA infection nor to guide or monitor treatment for MRSA infections.   Clostridium Difficile by PCR     Status: Abnormal   Collection Time: 05/07/14 11:39 PM  Result Value Ref Range Status   C difficile by pcr POSITIVE (A) NEGATIVE Final    Comment: CRITICAL RESULT CALLED TO, READ BACK BY AND VERIFIED WITH: Rockne Coons AT 0123 ON [id number] BY FORSYTH K    Studies/Results: No results found.  Medications: I have reviewed the patient's current medications.  Assesment: Active Problems:   Sepsis   Encephalopathy   Back pain   Protein-calorie malnutrition, severe COPD C.diffici colitis  Plan: Medications reviewed Will continue combinations of antibiotics Continue current treatment. Continue supportive care.    LOS: 3 days   Lisa [name] 05/09/2014, 12:00 PM

## 2014-05-09 NOTE — Progress Notes (Signed)
0758 RT was asked to allow patient to sleep, as she had not slept the night before, and to administer nebulizer treatment when she awakens.

## 2014-05-10 LAB — CBC WITH DIFFERENTIAL/PLATELET
Basophils Absolute: 0 10*3/uL (ref 0.0–0.1)
Basophils Relative: 0 % (ref 0–1)
EOS PCT: 3 % (ref 0–5)
Eosinophils Absolute: 0.2 10*3/uL (ref 0.0–0.7)
HCT: 27.5 % — ABNORMAL LOW (ref 36.0–46.0)
Hemoglobin: 9.3 g/dL — ABNORMAL LOW (ref 12.0–15.0)
Lymphocytes Relative: 33 % (ref 12–46)
Lymphs Abs: 2.2 10*3/uL (ref 0.7–4.0)
MCH: 30 pg (ref 26.0–34.0)
MCHC: 33.8 g/dL (ref 30.0–36.0)
MCV: 88.7 fL (ref 78.0–100.0)
Monocytes Absolute: 0.5 10*3/uL (ref 0.1–1.0)
Monocytes Relative: 7 % (ref 3–12)
Neutro Abs: 3.8 10*3/uL (ref 1.7–7.7)
Neutrophils Relative %: 57 % (ref 43–77)
Platelets: 219 10*3/uL (ref 150–400)
RBC: 3.1 MIL/uL — ABNORMAL LOW (ref 3.87–5.11)
RDW: 13.6 % (ref 11.5–15.5)
WBC: 6.6 10*3/uL (ref 4.0–10.5)

## 2014-05-10 LAB — COMPREHENSIVE METABOLIC PANEL
ALT: 15 U/L (ref 0–35)
ANION GAP: 11 (ref 5–15)
AST: 30 U/L (ref 0–37)
Albumin: 2.2 g/dL — ABNORMAL LOW (ref 3.5–5.2)
Alkaline Phosphatase: 74 U/L (ref 39–117)
BILIRUBIN TOTAL: 0.2 mg/dL — AB (ref 0.3–1.2)
BUN: 14 mg/dL (ref 6–23)
CO2: 26 mEq/L (ref 19–32)
Calcium: 8.7 mg/dL (ref 8.4–10.5)
Chloride: 109 mEq/L (ref 96–112)
Creatinine, Ser: 0.96 mg/dL (ref 0.50–1.10)
GFR calc non Af Amer: 56 mL/min — ABNORMAL LOW (ref >90)
GFR, EST AFRICAN AMERICAN: 64 mL/min — AB (ref >90)
Glucose, Bld: 96 mg/dL (ref 70–99)
POTASSIUM: 3.2 meq/L — AB (ref 3.7–5.3)
SODIUM: 146 meq/L (ref 137–147)
Total Protein: 6.6 g/dL (ref 6.0–8.3)

## 2014-05-10 MED ORDER — POTASSIUM CHLORIDE CRYS ER 20 MEQ PO TBCR
40.0000 meq | EXTENDED_RELEASE_TABLET | Freq: Once | ORAL | Status: AC
  Administered 2014-05-10: 40 meq via ORAL
  Filled 2014-05-10: qty 2

## 2014-05-10 MED ORDER — LORAZEPAM 2 MG/ML IJ SOLN
0.5000 mg | Freq: Four times a day (QID) | INTRAMUSCULAR | Status: DC | PRN
  Filled 2014-05-10 (×2): qty 1

## 2014-05-10 MED ORDER — PIPERACILLIN-TAZOBACTAM 3.375 G IVPB
INTRAVENOUS | Status: AC
Start: 2014-05-10 — End: 2014-05-10
  Filled 2014-05-10: qty 100

## 2014-05-10 MED ORDER — LORAZEPAM 0.5 MG PO TABS
0.5000 mg | ORAL_TABLET | Freq: Four times a day (QID) | ORAL | Status: DC | PRN
  Administered 2014-05-10 – 2014-05-12 (×5): 0.5 mg via ORAL
  Filled 2014-05-10 (×5): qty 1

## 2014-05-10 NOTE — Progress Notes (Signed)
Patient hitting and spitting at nurses and continues to attempt to get out of bed.  Patient refuses to take PO Ativan.  MD notified and received telephone order for IM Ativan 0.5 mg if patient can not tolerate PO Ativan.  Will continue to monitor patient.

## 2014-05-10 NOTE — Progress Notes (Signed)
Subjective: Patient is more confused and disoriented. Her po intake is also very poor. No fever or chills.  Objective: Vital signs in last 24 hours: Temp:  [97.9 F (36.6 C)-98.5 F (36.9 C)] 97.9 F (36.6 C) (11/29 0643) Pulse Rate:  [57-74] 57 (11/29 0643) Resp:  [17-18] 17 (11/29 0643) BP: (160-197)/(66-97) 197/66 mmHg (11/29 0643) SpO2:  [93 %-98 %] 98 % (11/29 0643) Weight:  [49.8 kg (109 lb 12.6 oz)] 49.8 kg (109 lb 12.6 oz) (11/29 0500) Weight change:  Last BM Date: 05/09/14  Intake/Output from previous day: 11/28 0701 - 11/29 0700 In: 6803.3 [P.O.:120; I.V.:5983.3; IV Piggyback:700] Out: 411 [Urine:405; Stool:6]  PHYSICAL EXAM General appearance: alert, fatigued and no distress Resp: diminished breath sounds bilaterally and rhonchi bilaterally Cardio: S1, S2 normal GI: soft, non-tender; bowel sounds normal; no masses,  no organomegaly Extremities: extremities normal, atraumatic, no cyanosis or edema  Lab Results:  Results for orders placed or performed during the hospital encounter of 05/06/14 (from the past 48 hour(s))  Comprehensive metabolic panel     Status: Abnormal   Collection Time: 05/09/14  6:26 AM  Result Value Ref Range   Sodium 143 137 - 147 mEq/L   Potassium 3.2 (L) 3.7 - 5.3 mEq/L   Chloride 103 96 - 112 mEq/L   CO2 25 19 - 32 mEq/L   Glucose, Bld 105 (H) 70 - 99 mg/dL   BUN 20 6 - 23 mg/dL   Creatinine, Ser 1.14 (H) 0.50 - 1.10 mg/dL   Calcium 9.0 8.4 - 10.5 mg/dL   Total Protein 7.5 6.0 - 8.3 g/dL   Albumin 2.7 (L) 3.5 - 5.2 g/dL   AST 25 0 - 37 U/L   ALT 15 0 - 35 U/L   Alkaline Phosphatase 87 39 - 117 U/L   Total Bilirubin 0.3 0.3 - 1.2 mg/dL   GFR calc non Af Amer 45 (L) >90 mL/min   GFR calc Af Amer 52 (L) >90 mL/min    Comment: (NOTE) The eGFR has been calculated using the CKD EPI equation. This calculation has not been validated in all clinical situations. eGFR's persistently <90 mL/min signify possible Chronic Kidney Disease.    Anion gap 15 5 - 15  CBC WITH DIFFERENTIAL     Status: Abnormal   Collection Time: 05/09/14  6:26 AM  Result Value Ref Range   WBC 7.6 4.0 - 10.5 K/uL    Comment: WHITE COUNT CONFIRMED ON SMEAR   RBC 3.26 (L) 3.87 - 5.11 MIL/uL   Hemoglobin 9.4 (L) 12.0 - 15.0 g/dL   HCT 29.0 (L) 36.0 - 46.0 %   MCV 89.0 78.0 - 100.0 fL   MCH 28.8 26.0 - 34.0 pg   MCHC 32.4 30.0 - 36.0 g/dL   RDW 13.4 11.5 - 15.5 %   Platelets 229 150 - 400 K/uL    Comment: SPECIMEN CHECKED FOR CLOTS PLATELET COUNT CONFIRMED BY SMEAR    Neutrophils Relative % 64 43 - 77 %   Neutro Abs 4.9 1.7 - 7.7 K/uL   Lymphocytes Relative 28 12 - 46 %   Lymphs Abs 2.2 0.7 - 4.0 K/uL   Monocytes Relative 6 3 - 12 %   Monocytes Absolute 0.5 0.1 - 1.0 K/uL   Eosinophils Relative 2 0 - 5 %   Eosinophils Absolute 0.2 0.0 - 0.7 K/uL   Basophils Relative 0 0 - 1 %   Basophils Absolute 0 0.0 - 0.1 K/uL   WBC Morphology ATYPICAL LYMPHOCYTES  Comprehensive metabolic panel     Status: Abnormal   Collection Time: 05/10/14  6:25 AM  Result Value Ref Range   Sodium 146 137 - 147 mEq/L   Potassium 3.2 (L) 3.7 - 5.3 mEq/L   Chloride 109 96 - 112 mEq/L   CO2 26 19 - 32 mEq/L   Glucose, Bld 96 70 - 99 mg/dL   BUN 14 6 - 23 mg/dL   Creatinine, Ser 0.96 0.50 - 1.10 mg/dL   Calcium 8.7 8.4 - 10.5 mg/dL   Total Protein 6.6 6.0 - 8.3 g/dL   Albumin 2.2 (L) 3.5 - 5.2 g/dL   AST 30 0 - 37 U/L   ALT 15 0 - 35 U/L   Alkaline Phosphatase 74 39 - 117 U/L   Total Bilirubin 0.2 (L) 0.3 - 1.2 mg/dL   GFR calc non Af Amer 56 (L) >90 mL/min   GFR calc Af Amer 64 (L) >90 mL/min    Comment: (NOTE) The eGFR has been calculated using the CKD EPI equation. This calculation has not been validated in all clinical situations. eGFR's persistently <90 mL/min signify possible Chronic Kidney Disease.    Anion gap 11 5 - 15  CBC WITH DIFFERENTIAL     Status: Abnormal   Collection Time: 05/10/14  6:25 AM  Result Value Ref Range   WBC 6.6 4.0 - 10.5  K/uL    Comment: WHITE COUNT CONFIRMED ON SMEAR   RBC 3.10 (L) 3.87 - 5.11 MIL/uL   Hemoglobin 9.3 (L) 12.0 - 15.0 g/dL   HCT 27.5 (L) 36.0 - 46.0 %   MCV 88.7 78.0 - 100.0 fL   MCH 30.0 26.0 - 34.0 pg   MCHC 33.8 30.0 - 36.0 g/dL   RDW 13.6 11.5 - 15.5 %   Platelets 219 150 - 400 K/uL    Comment: SPECIMEN CHECKED FOR CLOTS LARGE PLATELETS PRESENT PLATELET COUNT CONFIRMED BY SMEAR    Neutrophils Relative % 57 43 - 77 %   Neutro Abs 3.8 1.7 - 7.7 K/uL   Lymphocytes Relative 33 12 - 46 %   Lymphs Abs 2.2 0.7 - 4.0 K/uL   Monocytes Relative 7 3 - 12 %   Monocytes Absolute 0.5 0.1 - 1.0 K/uL   Eosinophils Relative 3 0 - 5 %   Eosinophils Absolute 0.2 0.0 - 0.7 K/uL   Basophils Relative 0 0 - 1 %   Basophils Absolute 0 0.0 - 0.1 K/uL    ABGS No results for input(s): PHART, PO2ART, TCO2, HCO3 in the last 72 hours.  Invalid input(s): PCO2 CULTURES Recent Results (from the past 240 hour(s))  Blood culture (routine x 2)     Status: None (Preliminary result)   Collection Time: 05/06/14 11:51 PM  Result Value Ref Range Status   Specimen Description BLOOD RIGHT ANTECUBITAL  Final   Special Requests BOTTLES DRAWN AEROBIC AND ANAEROBIC [place]  Final   Culture NO GROWTH 3 DAYS  Final   Report Status PENDING  Incomplete  Blood culture (routine x 2)     Status: None (Preliminary result)   Collection Time: 05/06/14 11:56 PM  Result Value Ref Range Status   Specimen Description BLOOD RIGHT ARM  Final   Special Requests BOTTLES DRAWN AEROBIC AND ANAEROBIC 10CC EACH  Final   Culture NO GROWTH 3 DAYS  Final   Report Status PENDING  Incomplete  Urine culture     Status: None   Collection Time: 05/07/14 12:38 AM  Result Value  Ref Range Status   Specimen Description URINE, CATHETERIZED  Final   Special Requests NONE  Final   Culture  Setup Time   Final    05/07/2014 21:41 Performed at [facility] Performed at [facility]   Final   Culture  NO GROWTH Performed at [facility]   Final   Report Status 05/08/2014 FINAL  Final  MRSA PCR Screening     Status: Abnormal   Collection Time: 05/07/14  4:50 AM  Result Value Ref Range Status   MRSA by PCR POSITIVE (A) NEGATIVE Final    Comment: CRITICAL RESULT CALLED TO, READ BACK BY AND VERIFIED WITH: FERGUSON,M AT 10:20AM ON 05/07/14 BY FESTERMAN,C        The GeneXpert MRSA Assay (FDA approved for NASAL specimens only), is one component of a comprehensive MRSA colonization surveillance program. It is not intended to diagnose MRSA infection nor to guide or monitor treatment for MRSA infections.   Clostridium Difficile by PCR     Status: Abnormal   Collection Time: 05/07/14 11:39 PM  Result Value Ref Range Status   C difficile by pcr POSITIVE (A) NEGATIVE Final    Comment: CRITICAL RESULT CALLED TO, READ BACK BY AND VERIFIED WITH: Rockne Coons AT 0123 ON [id number] BY FORSYTH K    Studies/Results: No results found.  Medications: I have reviewed the patient's current medications.  Assesment: Active Problems:   Sepsis   Encephalopathy   Back pain   Protein-calorie malnutrition, severe COPD C.diffici colitis  Plan: Medications reviewed Will continue combinations of antibiotics Will supplement K+ BMP in AM. Continue supportive care.    LOS: 4 days   Lisa [name] 05/10/2014, 12:56 PM

## 2014-05-10 NOTE — Plan of Care (Signed)
Problem: Phase I Progression Outcomes Goal: Voiding-avoid urinary catheter unless indicated Outcome: Completed/Met Date Met:  05/10/14

## 2014-05-10 NOTE — Plan of Care (Signed)
Problem: Phase II Progression Outcomes Goal: Obtain order to discontinue catheter if appropriate Outcome: Not Applicable Date Met:  05/10/14     

## 2014-05-11 LAB — COMPREHENSIVE METABOLIC PANEL WITH GFR
ALT: 19 U/L (ref 0–35)
AST: 44 U/L — ABNORMAL HIGH (ref 0–37)
Albumin: 2.4 g/dL — ABNORMAL LOW (ref 3.5–5.2)
Alkaline Phosphatase: 66 U/L (ref 39–117)
Anion gap: 12 (ref 5–15)
BUN: 11 mg/dL (ref 6–23)
CO2: 25 meq/L (ref 19–32)
Calcium: 8.8 mg/dL (ref 8.4–10.5)
Chloride: 105 meq/L (ref 96–112)
Creatinine, Ser: 0.92 mg/dL (ref 0.50–1.10)
GFR calc Af Amer: 68 mL/min — ABNORMAL LOW
GFR calc non Af Amer: 59 mL/min — ABNORMAL LOW
Glucose, Bld: 87 mg/dL (ref 70–99)
Potassium: 3.2 meq/L — ABNORMAL LOW (ref 3.7–5.3)
Sodium: 142 meq/L (ref 137–147)
Total Bilirubin: 0.3 mg/dL (ref 0.3–1.2)
Total Protein: 6.9 g/dL (ref 6.0–8.3)

## 2014-05-11 LAB — CBC WITH DIFFERENTIAL/PLATELET
Basophils Absolute: 0 K/uL (ref 0.0–0.1)
Basophils Relative: 0 % (ref 0–1)
Eosinophils Absolute: 0.2 K/uL (ref 0.0–0.7)
Eosinophils Relative: 3 % (ref 0–5)
HCT: 28.7 % — ABNORMAL LOW (ref 36.0–46.0)
Hemoglobin: 9.5 g/dL — ABNORMAL LOW (ref 12.0–15.0)
Lymphocytes Relative: 33 % (ref 12–46)
Lymphs Abs: 2.3 K/uL (ref 0.7–4.0)
MCH: 29.2 pg (ref 26.0–34.0)
MCHC: 33.1 g/dL (ref 30.0–36.0)
MCV: 88.3 fL (ref 78.0–100.0)
Monocytes Absolute: 0.7 K/uL (ref 0.1–1.0)
Monocytes Relative: 10 % (ref 3–12)
Neutro Abs: 3.9 K/uL (ref 1.7–7.7)
Neutrophils Relative %: 54 % (ref 43–77)
Platelets: 234 K/uL (ref 150–400)
RBC: 3.25 MIL/uL — ABNORMAL LOW (ref 3.87–5.11)
RDW: 13.4 % (ref 11.5–15.5)
WBC: 7.2 K/uL (ref 4.0–10.5)

## 2014-05-11 LAB — VANCOMYCIN, TROUGH: Vancomycin Tr: 9 ug/mL — ABNORMAL LOW (ref 10.0–20.0)

## 2014-05-11 MED ORDER — VANCOMYCIN HCL IN DEXTROSE 1-5 GM/200ML-% IV SOLN
1000.0000 mg | INTRAVENOUS | Status: DC
  Administered 2014-05-12: 1000 mg via INTRAVENOUS
  Filled 2014-05-11 (×2): qty 200

## 2014-05-11 MED ORDER — PIPERACILLIN-TAZOBACTAM 3.375 G IVPB
3.3750 g | Freq: Three times a day (TID) | INTRAVENOUS | Status: DC
  Administered 2014-05-11: 3.375 g via INTRAVENOUS
  Filled 2014-05-11 (×6): qty 50

## 2014-05-11 NOTE — Progress Notes (Signed)
Subjective: Patient remained confused and disoriented. She is requiring sitters to prevent her from falling and pulling her IV line. Her fever has subsided. Social worker has initiated bed search.  Objective: Vital signs in last 24 hours: Temp:  [97.8 F (36.6 C)-99.9 F (37.7 C)] 99.9 F (37.7 C) (11/29 2133) Pulse Rate:  [65-72] 72 (11/29 2133) Resp:  [18] 18 (11/29 2133) BP: (166-197)/(52-95) 197/95 mmHg (11/29 2133) SpO2:  [97 %-100 %] 97 % (11/29 2133) Weight:  [50.1 kg (110 lb 7.2 oz)] 50.1 kg (110 lb 7.2 oz) (11/30 0500) Weight change: 0.3 kg (10.6 oz) Last BM Date: 05/09/14  Intake/Output from previous day: 11/29 0701 - 11/30 0700 In: 1188.3 [I.V.:1088.3; IV Piggyback:100] Out: 0093 [Urine:1800; Stool:5]  PHYSICAL EXAM General appearance: alert, fatigued and no distress Resp: diminished breath sounds bilaterally and rhonchi bilaterally Cardio: S1, S2 normal GI: soft, non-tender; bowel sounds normal; no masses,  no organomegaly Extremities: extremities normal, atraumatic, no cyanosis or edema  Lab Results:  Results for orders placed or performed during the hospital encounter of 05/06/14 (from the past 48 hour(s))  Comprehensive metabolic panel     Status: Abnormal   Collection Time: 05/10/14  6:25 AM  Result Value Ref Range   Sodium 146 137 - 147 mEq/L   Potassium 3.2 (L) 3.7 - 5.3 mEq/L   Chloride 109 96 - 112 mEq/L   CO2 26 19 - 32 mEq/L   Glucose, Bld 96 70 - 99 mg/dL   BUN 14 6 - 23 mg/dL   Creatinine, Ser 0.96 0.50 - 1.10 mg/dL   Calcium 8.7 8.4 - 10.5 mg/dL   Total Protein 6.6 6.0 - 8.3 g/dL   Albumin 2.2 (L) 3.5 - 5.2 g/dL   AST 30 0 - 37 U/L   ALT 15 0 - 35 U/L   Alkaline Phosphatase 74 39 - 117 U/L   Total Bilirubin 0.2 (L) 0.3 - 1.2 mg/dL   GFR calc non Af Amer 56 (L) >90 mL/min   GFR calc Af Amer 64 (L) >90 mL/min    Comment: (NOTE) The eGFR has been calculated using the CKD EPI equation. This calculation has not been validated in all clinical  situations. eGFR's persistently <90 mL/min signify possible Chronic Kidney Disease.    Anion gap 11 5 - 15  CBC WITH DIFFERENTIAL     Status: Abnormal   Collection Time: 05/10/14  6:25 AM  Result Value Ref Range   WBC 6.6 4.0 - 10.5 K/uL    Comment: WHITE COUNT CONFIRMED ON SMEAR   RBC 3.10 (L) 3.87 - 5.11 MIL/uL   Hemoglobin 9.3 (L) 12.0 - 15.0 g/dL   HCT 27.5 (L) 36.0 - 46.0 %   MCV 88.7 78.0 - 100.0 fL   MCH 30.0 26.0 - 34.0 pg   MCHC 33.8 30.0 - 36.0 g/dL   RDW 13.6 11.5 - 15.5 %   Platelets 219 150 - 400 K/uL    Comment: SPECIMEN CHECKED FOR CLOTS LARGE PLATELETS PRESENT PLATELET COUNT CONFIRMED BY SMEAR    Neutrophils Relative % 57 43 - 77 %   Neutro Abs 3.8 1.7 - 7.7 K/uL   Lymphocytes Relative 33 12 - 46 %   Lymphs Abs 2.2 0.7 - 4.0 K/uL   Monocytes Relative 7 3 - 12 %   Monocytes Absolute 0.5 0.1 - 1.0 K/uL   Eosinophils Relative 3 0 - 5 %   Eosinophils Absolute 0.2 0.0 - 0.7 K/uL   Basophils Relative 0 0 -  1 %   Basophils Absolute 0 0.0 - 0.1 K/uL  Comprehensive metabolic panel     Status: Abnormal   Collection Time: 05/11/14  6:46 AM  Result Value Ref Range   Sodium 142 137 - 147 mEq/L   Potassium 3.2 (L) 3.7 - 5.3 mEq/L   Chloride 105 96 - 112 mEq/L   CO2 25 19 - 32 mEq/L   Glucose, Bld 87 70 - 99 mg/dL   BUN 11 6 - 23 mg/dL   Creatinine, Ser 0.92 0.50 - 1.10 mg/dL   Calcium 8.8 8.4 - 10.5 mg/dL   Total Protein 6.9 6.0 - 8.3 g/dL   Albumin 2.4 (L) 3.5 - 5.2 g/dL   AST 44 (H) 0 - 37 U/L   ALT 19 0 - 35 U/L   Alkaline Phosphatase 66 39 - 117 U/L   Total Bilirubin 0.3 0.3 - 1.2 mg/dL   GFR calc non Af Amer 59 (L) >90 mL/min   GFR calc Af Amer 68 (L) >90 mL/min    Comment: (NOTE) The eGFR has been calculated using the CKD EPI equation. This calculation has not been validated in all clinical situations. eGFR's persistently <90 mL/min signify possible Chronic Kidney Disease.    Anion gap 12 5 - 15    ABGS No results for input(s): PHART, PO2ART, TCO2,  HCO3 in the last 72 hours.  Invalid input(s): PCO2 CULTURES Recent Results (from the past 240 hour(s))  Blood culture (routine x 2)     Status: None (Preliminary result)   Collection Time: 05/06/14 11:51 PM  Result Value Ref Range Status   Specimen Description BLOOD RIGHT ANTECUBITAL  Final   Special Requests BOTTLES DRAWN AEROBIC AND ANAEROBIC [place]  Final   Culture NO GROWTH 3 DAYS  Final   Report Status PENDING  Incomplete  Blood culture (routine x 2)     Status: None (Preliminary result)   Collection Time: 05/06/14 11:56 PM  Result Value Ref Range Status   Specimen Description BLOOD RIGHT ARM  Final   Special Requests BOTTLES DRAWN AEROBIC AND ANAEROBIC 10CC EACH  Final   Culture NO GROWTH 3 DAYS  Final   Report Status PENDING  Incomplete  Urine culture     Status: None   Collection Time: 05/07/14 12:38 AM  Result Value Ref Range Status   Specimen Description URINE, CATHETERIZED  Final   Special Requests NONE  Final   Culture  Setup Time   Final    05/07/2014 21:41 Performed at [place] Performed at [facility]   Final   Culture NO GROWTH Performed at [facility]   Final   Report Status 05/08/2014 FINAL  Final  MRSA PCR Screening     Status: Abnormal   Collection Time: 05/07/14  4:50 AM  Result Value Ref Range Status   MRSA by PCR POSITIVE (A) NEGATIVE Final    Comment: CRITICAL RESULT CALLED TO, READ BACK BY AND VERIFIED WITH: FERGUSON,M AT 10:20AM ON 05/07/14 BY FESTERMAN,C        The GeneXpert MRSA Assay (FDA approved for NASAL specimens only), is one component of a comprehensive MRSA colonization surveillance program. It is not intended to diagnose MRSA infection nor to guide or monitor treatment for MRSA infections.   Clostridium Difficile by PCR     Status: Abnormal   Collection Time: 05/07/14 11:39 PM  Result Value Ref Range Status   C difficile by pcr POSITIVE (  A) NEGATIVE Final    Comment:  CRITICAL RESULT CALLED TO, READ BACK BY AND VERIFIED WITH: Rockne Coons AT 0123 ON [id number] BY FORSYTH K    Studies/Results: No results found.  Medications: I have reviewed the patient's current medications.  Assesment: Active Problems:   Sepsis   Encephalopathy   Back pain   Protein-calorie malnutrition, severe COPD C.diffici colitis  Plan: Medications reviewed Will continue combinations of antibiotics Continue current treatment. Will plan to transfer to nursing home tomorrow if bed is available.    LOS: 5 days   Lisa [name] 05/11/2014, 7:50 AM

## 2014-05-11 NOTE — Clinical Social Work Placement (Signed)
Clinical Social Work Department CLINICAL SOCIAL WORK PLACEMENT NOTE 05/11/2014  Patient:  Lisa [name], Lisa [name]  Account Number:  [id number] Admit date:  05/06/2014  Clinical Social Worker:  Eduard Clos, LCSW  Date/time:  05/08/2014 04:08 PM  Clinical Social Work is seeking post-discharge placement for this patient at the following level of care:   SKILLED NURSING   (*CSW will update this form in Epic as items are completed)   05/08/2014  Patient/family provided with [place] Department of Clinical Social Work's list of facilities offering this level of care within the geographic area requested by the patient (or if unable, by the patient's family).  05/08/2014  Patient/family informed of their freedom to choose among providers that offer the needed level of care, that participate in Medicare, Medicaid or managed care program needed by the patient, have an available bed and are willing to accept the patient.  05/08/2014  Patient/family informed of [facility]' ownership interest in [facility], as well as of the fact that they are under no obligation to receive care at this facility.  PASARR submitted to EDS on 05/08/2014 PASARR number received on 05/08/2014  FL2 transmitted to all facilities in geographic area requested by pt/family on  05/08/2014 FL2 transmitted to all facilities within larger geographic area on   Patient informed that his/her managed care company has contracts with or will negotiate with  certain facilities, including the following:     Patient/family informed of bed offers received:  05/11/2014 Patient chooses bed at [place] Physician recommends and patient chooses bed at  [place]  Patient to be transferred to  on   Patient to be transferred to facility by  Patient and family notified of transfer on  Name of family member notified:    The following physician request were entered in Epic:   Additional  Comments:  Lisa [name], [place]

## 2014-05-11 NOTE — Clinical Social Work Note (Signed)
CSW presented bed offers to pt and pt's husband and they choose [facility]. Facility notified. Awaiting stability for d/c.  Benay Pike, [place]

## 2014-05-11 NOTE — Evaluation (Signed)
Occupational Therapy Evaluation Patient Details Name: Lisa [name] MRN: [id number] DOB: 29-Apr-1935 Today's Date: 05/11/2014    History of Present Illness Lisa [name] is a 79 y.o. female who presents to the Emergency Department complaining of 1 month of back pain and generalized weakness; according to family, she is "unable to walk". Family reports that patient was having back pain after a recent strain 1 month PTA; patient was seen at [facility] and was told that she had a "blood infection" (admitted for 2 weeks). She was then placed at [facility] (21 days) for rehab; patient was released yesterday. Family denies cough or new, concerning falls (patient regularly "falls out of bed").   Clinical Impression   Pt is presenting to acute OT with above situation.  She had decreased activity tolerance with ADL skills at this time.  Strength is WFL to engage in needed activities, but still generally weak.  Pt will have increased difficulty engagin in ADL skills at home currently, especially as bedroom and bathroom are upstairs in her home and her husband is recovering from recent CVA. Recommend SNF OT at this time for increased strengthening overall and to improve safe  engagement in ADL skills.      Follow Up Recommendations  SNF    Equipment Recommendations  Other (comment) (Defer to SNF)    Recommendations for Other Services       Precautions / Restrictions Precautions Precautions: Fall Restrictions Weight Bearing Restrictions: No      Mobility Bed Mobility                  Transfers                      Balance                                            ADL                                         General ADL Comments: Decreased endurance in ADL skills. Supervision with seated activities     Vision                     Perception     Praxis      Pertinent Vitals/Pain Pain Assessment: No/denies pain      Hand Dominance Right   Extremity/Trunk Assessment Upper Extremity Assessment Upper Extremity Assessment: Overall WFL for tasks assessed;Generalized weakness   Lower Extremity Assessment Lower Extremity Assessment: Defer to PT evaluation       Communication Communication Communication: No difficulties   Cognition Arousal/Alertness: Awake/alert Behavior During Therapy: WFL for tasks assessed/performed Overall Cognitive Status: No family/caregiver present to determine baseline cognitive functioning                     General Comments       Exercises       Shoulder Instructions      Home Living Family/patient expects to be discharged to:: Skilled nursing facility Living Arrangements: Spouse/significant other Available Help at Discharge: Family;Available PRN/intermittently Type of Home: House Home Access: Stairs to enter [employer] of Steps: 2 Entrance Stairs-Rails: Can reach both Home Layout: Two level Alternate Level Stairs-Number of Steps: 13,  with bed/bath upstairs   Bathroom Shower/Tub: Teacher, early years/pre: Standard     Home Equipment: Environmental consultant - 4 wheels;Cane - single point          Prior Functioning/Environment          Comments: recently discharged from [facility] and functional status is unknown    OT Diagnosis: Generalized weakness   OT Problem List: Decreased strength;Decreased safety awareness   OT Treatment/Interventions: Self-care/ADL training;Therapeutic exercise;Therapeutic activities;Cognitive remediation/compensation;Patient/family education    OT Goals(Current goals can be found in the care plan section) Acute Rehab OT Goals Patient Stated Goal: none stated OT Goal Formulation: With patient Time For Goal Achievement: 05/25/14 Potential to Achieve Goals: Good ADL Goals Pt Will Perform Grooming: standing;with supervision Pt Will Transfer to Toilet: with min guard assist;regular height toilet Pt Will  Perform Toileting - Clothing Manipulation and hygiene: with min guard assist Pt/caregiver will Perform Home Exercise Program: Increased strength;Both right and left upper extremity  OT Frequency: Min 2X/week   Barriers to D/C: Decreased caregiver support (husband recovering from CVA)          Co-evaluation              End of Session    Activity Tolerance: Patient tolerated treatment well Patient left: in bed;with nursing/sitter in room;with call bell/phone within reach;with bed alarm set   Time: 7939-0300 OT Time Calculation (min): 27 min Charges:  OT General Charges $OT Visit: 1 Procedure OT Evaluation $Initial OT Evaluation Tier I: 1 Procedure G-Codes:     Bea Graff Lasha Echeverria, MS, OTR/L [place] 05/11/2014, 9:08 AM

## 2014-05-11 NOTE — Progress Notes (Signed)
ANTIBIOTIC CONSULT NOTE - FOLLOW UP  Pharmacy Consult for Vancomycin & Zosyn Indication: rule out sepsis  No Known Allergies  Patient Measurements: Height: 5\' 7"  (170.2 cm) Weight: 110 lb 7.2 oz (50.1 kg) IBW/kg (Calculated) : 61.6  Vital Signs:   Intake/Output from previous day: 11/29 0701 - 11/30 0700 In: 1188.3 [I.V.:1088.3; IV Piggyback:100] Out: 1194 [Urine:1800; Stool:5] Intake/Output from this shift: Total I/O In: 800 [P.O.:800] Out: 301 [Urine:300; Stool:1]  Labs:  Recent Labs  05/09/14 0626 05/10/14 0625 05/11/14 0646  WBC 7.6 6.6 7.2  HGB 9.4* 9.3* 9.5*  PLT 229 219 234  CREATININE 1.14* 0.96 0.92   Estimated Creatinine Clearance: 40.5 mL/min (by C-G formula based on Cr of 0.92).  Recent Labs  05/11/14 0646  [place] 9.0*     Microbiology: Recent Results (from the past 720 hour(s))  Blood culture (routine x 2)     Status: None (Preliminary result)   Collection Time: 05/06/14 11:51 PM  Result Value Ref Range Status   Specimen Description BLOOD RIGHT ANTECUBITAL  Final   Special Requests BOTTLES DRAWN AEROBIC AND ANAEROBIC [place]  Final   Culture NO GROWTH 4 DAYS  Final   Report Status PENDING  Incomplete  Blood culture (routine x 2)     Status: None (Preliminary result)   Collection Time: 05/06/14 11:56 PM  Result Value Ref Range Status   Specimen Description BLOOD RIGHT ARM  Final   Special Requests BOTTLES DRAWN AEROBIC AND ANAEROBIC 10CC EACH  Final   Culture NO GROWTH 4 DAYS  Final   Report Status PENDING  Incomplete  Urine culture     Status: None   Collection Time: 05/07/14 12:38 AM  Result Value Ref Range Status   Specimen Description URINE, CATHETERIZED  Final   Special Requests NONE  Final   Culture  Setup Time   Final    05/07/2014 21:41 Performed at [place] Performed at [facility]   Final   Culture NO GROWTH Performed at [facility]   Final   Report Status  05/08/2014 FINAL  Final  MRSA PCR Screening     Status: Abnormal   Collection Time: 05/07/14  4:50 AM  Result Value Ref Range Status   MRSA by PCR POSITIVE (A) NEGATIVE Final    Comment: CRITICAL RESULT CALLED TO, READ BACK BY AND VERIFIED WITH: FERGUSON,M AT 10:20AM ON 05/07/14 BY FESTERMAN,C        The GeneXpert MRSA Assay (FDA approved for NASAL specimens only), is one component of a comprehensive MRSA colonization surveillance program. It is not intended to diagnose MRSA infection nor to guide or monitor treatment for MRSA infections.   Clostridium Difficile by PCR     Status: Abnormal   Collection Time: 05/07/14 11:39 PM  Result Value Ref Range Status   C difficile by pcr POSITIVE (A) NEGATIVE Final    Comment: CRITICAL RESULT CALLED TO, READ BACK BY AND VERIFIED WITH: Rockne Coons AT 0123 ON [id number] BY FORSYTH K     Anti-infectives    Start     Dose/Rate Route Frequency Ordered Stop   05/11/14 2200  piperacillin-tazobactam (ZOSYN) IVPB 3.375 g     3.375 g12.5 mL/hr over 240 Minutes Intravenous Every 8 hours 05/11/14 1409     05/08/14 1400  metroNIDAZOLE (FLAGYL) tablet 500 mg     500 mg Oral 3 times per day 05/08/14 1210     05/08/14 0600  vancomycin (  VANCOCIN) IVPB 750 mg/150 ml premix     750 mg150 mL/hr over 60 Minutes Intravenous Every 24 hours 05/07/14 0248     05/07/14 0800  piperacillin-tazobactam (ZOSYN) IVPB 3.375 g  Status:  Discontinued     3.375 g12.5 mL/hr over 240 Minutes Intravenous Every 8 hours 05/07/14 0248 05/11/14 1409   05/07/14 0230  vancomycin (VANCOCIN) IVPB 1000 mg/200 mL premix     1,000 mg200 mL/hr over 60 Minutes Intravenous  Once 05/07/14 0203 05/07/14 0631   05/07/14 0200  piperacillin-tazobactam (ZOSYN) IVPB 3.375 g     3.375 g100 mL/hr over 30 Minutes Intravenous  Once 05/07/14 0151 05/07/14 0253      Assessment: 53 yoF admitted with back pain, fever, tachycardia & started on broad-spectrum antibiotics for possible urosepsis.   WBC was  mildly elevated which has normalized, pt afebrile, lactic acid normal.  Cx data NGTD.   CDiff + on Flagyl. Renal function stable.  Vancomycin trough slightly below goal range.   Vancomycin 11/26>> Zosyn 11/26>> Flagyl 11/27>>  Goal of Therapy:  Vancomycin trough level 15-20 mcg/ml  Plan:  Increase Vancomycin to 1gm IV q24h Continue Zosyn 3.375gm IV Q8h to be infused over 4hrs Weekly Vancomycin trough while on Vancomycin Monitor renal function and cx data  Duration of therapy per MD-consider d/c broad-spectrum antibiotics at this time & continue Flagyl for 14 more days.  Biagio Borg 05/11/2014,2:11 PM

## 2014-05-11 NOTE — Progress Notes (Signed)
Physical Therapy Treatment Patient Details Name: Lisa [name] MRN: [id number] DOB: Mar 31, 1935 Today's Date: 05/11/2014    History of Present Illness Lisa [name] is a 79 y.o. female who presents to the Emergency Department complaining of 1 month of back pain and generalized weakness; according to family, she is "unable to walk". Family reports that patient was having back pain after a recent strain 1 month PTA; patient was seen at [facility] and was told that she had a "blood infection" (admitted for 2 weeks). She was then placed at [facility] (21 days) for rehab; patient was released yesterday. Family denies cough or new, concerning falls (patient regularly "falls out of bed").    PT Comments    Pt was very cooperative with PT and was more oriented.  She did have inconsistency in the ability to follow directions and frequently needed visual or tactile support.  She was able to participate in LE strengthening in supine and standing.  Balance is still compromised which especially is evidenced during gait with occasional loss of balance.  She continues to need SNF at d/c in my opinion.  Follow Up Recommendations  SNF     Equipment Recommendations  None recommended by PT    Recommendations for Other Services  none     Precautions / Restrictions Precautions Precautions: Fall Restrictions Weight Bearing Restrictions: No    Mobility  Bed Mobility Overal bed mobility: Modified Independent   Rolling: Independent Sidelying to sit: Independent Supine to sit: Independent        Transfers Overall transfer level: Needs assistance Equipment used: Rolling walker (2 wheeled) Transfers: Sit to/from Stand Sit to Stand: Min assist         General transfer comment: pt needs cues to transfer weight anteriorly as she goes from sit to stand...she is able to do this without hand held assist but it is very labored  Ambulation/Gait Ambulation/Gait assistance: Min assist Ambulation  Distance (Feet): 60 Feet Assistive device: Rolling walker (2 wheeled) Gait Pattern/deviations: Trunk flexed Gait velocity: toward end of gait, knees tend to buckle due to poor endurance, needs guidance to avoid obstacles..frequent cues to widen base of support Gait velocity interpretation: Below normal speed for age/gender General Gait Details: gait is slower and more labored today but no c/o pain during gait   Stairs            Wheelchair Mobility    Modified Rankin (Stroke Patients Only)       Balance Overall balance assessment: Needs assistance Sitting-balance support: No upper extremity supported Sitting balance-Leahy Scale: Good     Standing balance support: Bilateral upper extremity supported Standing balance-Leahy Scale: Fair                      Cognition Arousal/Alertness: Awake/alert Behavior During Therapy: WFL for tasks assessed/performed Overall Cognitive Status: Within Functional Limits for tasks assessed         Following Commands: Follows one step commands inconsistently     Problem Solving: Slow processing;Difficulty sequencing;Requires tactile cues      Exercises General Exercises - Lower Extremity Straight Leg Raises: AROM;Both;10 reps;Supine Toe Raises: AROM;Both;10 reps;Standing Mini-Sqauts: AROM;Both;5 reps;Standing Other Exercises Other Exercises: resisted trunk rolling x 10 repetitions    General Comments        Pertinent Vitals/Pain Pain Assessment: No/denies pain (no pain at rest but 9/10 pain in upright position) Pain Location: central lumbar spine Pain Intervention(s): Repositioned    Home Living  Prior Function            PT Goals (current goals can now be found in the care plan section) Progress towards PT goals: Progressing toward goals    Frequency       PT Plan Current plan remains appropriate    Co-evaluation             End of Session Equipment Utilized During  Treatment: Gait belt Activity Tolerance: Patient limited by fatigue Patient left: in chair;with call bell/phone within reach;with chair alarm set;with nursing/sitter in room     Time: 7530-0511 PT Time Calculation (min) (ACUTE ONLY): 33 min  Charges:  $Gait Training: 8-22 mins $Therapeutic Exercise: 8-22 mins                    G Codes:      Sable Feil 2014/05/27, 1:31 PM

## 2014-05-12 LAB — CULTURE, BLOOD (ROUTINE X 2)
CULTURE: NO GROWTH
Culture: NO GROWTH

## 2014-05-12 LAB — CBC WITH DIFFERENTIAL/PLATELET
BASOS ABS: 0 10*3/uL (ref 0.0–0.1)
BASOS PCT: 0 % (ref 0–1)
EOS PCT: 2 % (ref 0–5)
Eosinophils Absolute: 0.1 10*3/uL (ref 0.0–0.7)
HEMATOCRIT: 27.6 % — AB (ref 36.0–46.0)
Hemoglobin: 9.3 g/dL — ABNORMAL LOW (ref 12.0–15.0)
Lymphocytes Relative: 24 % (ref 12–46)
Lymphs Abs: 2.1 10*3/uL (ref 0.7–4.0)
MCH: 29.7 pg (ref 26.0–34.0)
MCHC: 33.7 g/dL (ref 30.0–36.0)
MCV: 88.2 fL (ref 78.0–100.0)
MONO ABS: 0.6 10*3/uL (ref 0.1–1.0)
Monocytes Relative: 7 % (ref 3–12)
Neutro Abs: 6 10*3/uL (ref 1.7–7.7)
Neutrophils Relative %: 67 % (ref 43–77)
Platelets: 232 10*3/uL (ref 150–400)
RBC: 3.13 MIL/uL — ABNORMAL LOW (ref 3.87–5.11)
RDW: 13.6 % (ref 11.5–15.5)
WBC: 8.9 10*3/uL (ref 4.0–10.5)

## 2014-05-12 MED ORDER — TRAMADOL HCL 50 MG PO TABS: 50.0000 mg | ORAL_TABLET | Freq: Four times a day (QID) | ORAL | Status: DC | PRN

## 2014-05-12 MED ORDER — LORAZEPAM 2 MG/ML IJ SOLN
0.5000 mg | Freq: Once | INTRAMUSCULAR | Status: AC
  Administered 2014-05-12: 0.5 mg via INTRAMUSCULAR

## 2014-05-12 MED ORDER — LORAZEPAM 0.5 MG PO TABS: 0.5000 mg | ORAL_TABLET | Freq: Three times a day (TID) | ORAL | Status: DC | PRN

## 2014-05-12 MED ORDER — POTASSIUM CHLORIDE CRYS ER 20 MEQ PO TBCR: 40.0000 meq | EXTENDED_RELEASE_TABLET | Freq: Once | ORAL | Status: DC

## 2014-05-12 MED ORDER — METRONIDAZOLE 500 MG PO TABS: 500.0000 mg | ORAL_TABLET | Freq: Three times a day (TID) | ORAL | Status: DC

## 2014-05-12 MED ORDER — POTASSIUM CHLORIDE CRYS ER 20 MEQ PO TBCR
40.0000 meq | EXTENDED_RELEASE_TABLET | Freq: Once | ORAL | Status: AC
  Administered 2014-05-12: 40 meq via ORAL
  Filled 2014-05-12: qty 2

## 2014-05-12 NOTE — Plan of Care (Signed)
Problem: Phase I Progression Outcomes Goal: Pain controlled with appropriate interventions Outcome: Progressing Goal: Hemodynamically stable Outcome: Progressing

## 2014-05-12 NOTE — Progress Notes (Signed)
Pt combative & striking staff while assisting pt with pt care & bathing. Pt uncooperative & continues to walk without assistance, unsteady gait noted. Pt is a HIGH fall risk. MD notified & new order given to give pt Ativan 0.5mg  IM x 1 time only for agitation.

## 2014-05-12 NOTE — Clinical Social Work Placement (Signed)
Clinical Social Work Department CLINICAL SOCIAL WORK PLACEMENT NOTE 05/12/2014  Patient:  KRISSA, UTKE  Account Number:  [id number] Admit date:  05/06/2014  Clinical Social Worker:  Daiva Huge  Date/time:  05/08/2014 04:08 PM  Clinical Social Work is seeking post-discharge placement for this patient at the following level of care:   SKILLED NURSING   (*CSW will update this form in Epic as items are completed)   05/08/2014  Patient/family provided with [facility] Department of Clinical Social Work's list of facilities offering this level of care within the geographic area requested by the patient (or if unable, by the patient's family).  05/08/2014  Patient/family informed of their freedom to choose among providers that offer the needed level of care, that participate in Medicare, Medicaid or managed care program needed by the patient, have an available bed and are willing to accept the patient.  05/08/2014  Patient/family informed of [facility]' ownership interest in [facility], as well as of the fact that they are under no obligation to receive care at this facility.  PASARR submitted to EDS on 05/08/2014 PASARR number received on 05/08/2014  FL2 transmitted to all facilities in geographic area requested by pt/family on  05/08/2014 FL2 transmitted to all facilities within larger geographic area on   Patient informed that his/her managed care company has contracts with or will negotiate with  certain facilities, including the following:     Patient/family informed of bed offers received:  05/11/2014 Patient chooses bed at [facility] Physician recommends and patient chooses bed at  [facility]  Patient to be transferred to [place] on  05/12/2014 Patient to be transferred to facility by [facility] Patient and family notified of transfer on 05/12/2014 Name of family member notified:  Leane Para- spouse  The following  physician request were entered in Epic:   Additional Comments:  Benay Pike, [place]

## 2014-05-12 NOTE — Progress Notes (Signed)
81 Spoke with Dr.Fanta regarding pt continuing to pull out IV sites, MD confirmed she doesn't need IV access at this time.

## 2014-05-12 NOTE — Progress Notes (Signed)
46 Pt d/c from facility to [place] via [facility] transportation. No IV site to remove. Pt left dressed via w/c with outside transportation service.

## 2014-05-12 NOTE — Care Management Note (Signed)
    Page 1 of 1   05/12/2014     12:41:25 PM CARE MANAGEMENT NOTE 05/12/2014  Patient:  Lisa [name], Lisa [name]   Account Number:  [id number]  Date Initiated:  05/08/2014  Documentation initiated by:  Jolene Provost  Subjective/Objective Assessment:   Pt is from home with husband but recently discharged from SNF. Pt has cane, walker, wheelchair at home. Pt has no HH services or medication needs at home.     Action/Plan:   Pt plans to discharge back to SNF per Pt's recommendations. CSW aware of discharge plan and will make arrangements. No CM needs identified at this time.   Anticipated DC Date:  05/10/2014   Anticipated DC Plan:  SKILLED NURSING FACILITY  In-house referral  Clinical Social Worker      DC Planning Services  CM consult      Choice offered to / List presented to:             Status of service:  Completed, signed off Medicare Important Message given?  YES (If response is "NO", the following Medicare IM given date fields will be blank) Date Medicare IM given:  05/08/2014 Medicare IM given by:  Jolene Provost Date Additional Medicare IM given:  05/12/2014 Additional Medicare IM given by:  Theophilus Kinds  Discharge Disposition:  [place]  Per UR Regulation:    If discussed at Long Length of Stay Meetings, dates discussed:   05/12/2014    Comments:  05/12/14 [place], RN BSN CM Pt discharged to [place]. CSW to arrange discharge to facility.  05/08/2014 [place], RN, MSN, [employer]

## 2014-05-12 NOTE — Clinical Social Work Note (Signed)
Pt's brother Juanda Crumble came to see pt and asked to speak to CSW. CM and CSW met with him and he requested that pt go to [facility]. CSW explained that yesterday pt was very adamant that she was not going back to [facility] and was willing to go to [facility]. Her husband was also here and agreed to this as well since [facility] (their first choice) did not have a bed. Pt recognized her brother and told us who he was. When asked in his presence about considering [facility] instead of [facility], pt stated that she was not interested in going back to [facility]. Charles stepped out of room and said that both pt and pt's husband have cognitive impairments. Pt's husband had a stroke a few months ago. CSW and CM explained that this does not always mean that they cannot make decisions. Pt's husband was very clear yesterday during face-to-face discussion and he agreed not to return to [facility]. Juanda Crumble said he does not have HCPOA. He walked away from CM and CSW and ended conversation.  Benay Pike, [place]

## 2014-05-12 NOTE — Progress Notes (Signed)
UR chart review completed.  

## 2014-05-12 NOTE — Clinical Social Work Note (Addendum)
CSW notified [facility] of agitation/aggressive behavior this morning. They continue to be willing to accept pt. D/C today. Pt, pt's husband, and facility aware and agreeable. Another family member was also present in room during discussion. Facility [facility] to provide transport. D/C summary faxed.   Lisa [name], [place]

## 2014-05-12 NOTE — Discharge Summary (Addendum)
Physician Discharge Summary  Patient ID: Jaleeya Mcnelly MRN: [id number] DOB/AGE: March 04, 1935 79 y.o. Primary Care Physician:Letta Cargile, MD Admit date: 05/06/2014 Discharge date: 05/12/2014    Discharge Diagnoses:   Active Problems:   Sepsis   Encephalopathy   Back pain   Protein-calorie malnutrition, severe c.diffi colitis COPD    Medication List    STOP taking these medications        ALPRAZolam 0.5 MG tablet  Commonly known as:  XANAX     diazepam 5 MG tablet  Commonly known as:  VALIUM      TAKE these medications        acetaminophen 500 MG tablet  Commonly known as:  TYLENOL  Take 500 mg by mouth every 6 (six) hours as needed for headache.     CENTRUM ADULTS PO  Take 1 tablet by mouth daily.     diltiazem 120 MG 24 hr tablet  Commonly known as:  CARDIZEM LA  Take 120 mg by mouth daily.     feeding supplement (ENSURE COMPLETE) Liqd  Take 237 mLs by mouth 2 (two) times daily between meals.     food thickener Powd  Commonly known as:  THICK IT  Use as needed with meals     furosemide 40 MG tablet  Commonly known as:  LASIX  Take 40 mg by mouth daily as needed for fluid.     ibuprofen 400 MG tablet  Commonly known as:  ADVIL,MOTRIN  Take 400 mg by mouth 3 (three) times daily.     COMBIVENT RESPIMAT 20-100 MCG/ACT Aers respimat  Generic drug:  Ipratropium-Albuterol  Inhale 1 puff into the lungs every 6 (six) hours.     ipratropium-albuterol 0.5-2.5 (3) MG/3ML Soln  Commonly known as:  DUONEB  Take 3 mLs by nebulization every 6 (six) hours as needed. For shortness of breath     labetalol 300 MG tablet  Commonly known as:  NORMODYNE  Take 300 mg by mouth 2 (two) times daily.     LORazepam 0.5 MG tablet  Commonly known as:  ATIVAN  Take 1 tablet (0.5 mg total) by mouth every 8 (eight) hours as needed for anxiety.     losartan-hydrochlorothiazide 100-25 MG per tablet  Commonly known as:  HYZAAR  Take 1 tablet by mouth daily.      metroNIDAZOLE 500 MG tablet  Commonly known as:  FLAGYL  Take 1 tablet (500 mg total) by mouth every 8 (eight) hours.     mirtazapine 15 MG tablet  Commonly known as:  REMERON  Take 15 mg by mouth at bedtime.     oyster calcium 500 MG Tabs tablet  Take 500 mg of elemental calcium by mouth daily.     potassium chloride SA 20 MEQ tablet  Commonly known as:  K-DUR,KLOR-CON  Take 2 tablets (40 mEq total) by mouth once.     traMADol 50 MG tablet  Commonly known as:  ULTRAM  Take 1 tablet (50 mg total) by mouth every 6 (six) hours as needed for moderate pain.     VITAMIN-B COMPLEX PO  Take 1 tablet by mouth daily.        Discharged Condition: improved    Consults: None  Significant Diagnostic Studies: Dg Chest 2 View  05/07/2014   CLINICAL DATA:  Back pain  EXAM: CHEST  2 VIEW  COMPARISON:  04/18/2014  FINDINGS: No cardiomegaly. Negative aortic and hilar contours. There is no edema, consolidation, effusion, or pneumothorax. Typical nipple  shadow at the left base. No parenchymal nodule seen on recent chest imaging.  IMPRESSION: No active cardiopulmonary disease.   Electronically Signed   By: Jorje Guild M.D.   On: 05/07/2014 00:44   Ct Head Wo Contrast  05/07/2014   CLINICAL DATA:  Initial evaluation for acute confusion.  EXAM: CT HEAD WITHOUT CONTRAST  TECHNIQUE: Contiguous axial images were obtained from the base of the skull through the vertex without intravenous contrast.  COMPARISON:  Prior study from 04/09/2014.  FINDINGS: Scattered hypodensity within the periventricular and deep white matter both cerebral hemispheres is most consistent with mild chronic small vessel ischemic changes. Prominent vascular calcifications present within the carotid siphons bilaterally.  There is no acute intracranial hemorrhage or infarct. No mass lesion or midline shift. Gray-white matter differentiation is well maintained. Ventricles are normal in size without evidence of hydrocephalus. CSF  containing spaces are within normal limits. No extra-axial fluid collection.  The calvarium is intact.  Orbital soft tissues are within normal limits.  Scattered mucoperiosteal thickening present within the sphenoid sinuses and ethmoidal air cells bilaterally. No mastoid effusion.  Scalp soft tissues are unremarkable.  IMPRESSION: 1. No acute intracranial process. 2. Mild chronic small vessel ischemic disease. 3. Mild bilateral sphenoid and ethmoidal sinus disease.   Electronically Signed   By: Jeannine Boga M.D.   On: 05/07/2014 03:16   Ct Lumbar Spine Wo Contrast  05/07/2014   CLINICAL DATA:  One month of back pain and generalized weakness. History of recent falls. Initial encounter  EXAM: CT LUMBAR SPINE WITHOUT CONTRAST  TECHNIQUE: Multidetector CT imaging of the lumbar spine was performed without intravenous contrast administration. Multiplanar CT image reconstructions were also generated.  COMPARISON:  Abdominal CT 12/31/2008  FINDINGS: There is focal disc narrowing at L5-S1 with endplate erosions and sclerosis. These changes have developed since 2010. The surrounding soft tissues are infiltrated and there is ventral epidural thickening. Findings are highly concerning for discitis/osteomyelitis with epidural infection.  No acute fracture.  Osteopenia.  Diffuse degenerative disc bulging which mildly narrows the bilateral foramina. Disc bulge at L3-4 deforms the ventral thecal sac.  IMPRESSION: Findings consistent with L5-S1 discitis/osteomyelitis, likely with ventral epidural infection. Recommend lumbar spine MRI with contrast.   Electronically Signed   By: Jorje Guild M.D.   On: 05/07/2014 03:08   Dg Chest Port 1 View  04/18/2014   CLINICAL DATA:  Respiratory distress.  EXAM: PORTABLE CHEST - 1 VIEW  COMPARISON:  04/06/2014  FINDINGS: Interval placement of a right PICC catheter. Tip localizes about 2.5 vertebral bodies below the carina suggesting location in the cavoatrial junction. No  pneumothorax. Normal heart size and pulmonary vascularity. No focal airspace disease or consolidation in the lungs. No blunting of costophrenic angles. No pneumothorax. Mediastinal contours appear intact.  IMPRESSION: PICC line tip appears to be localized at the cavoatrial junction. No evidence of active pulmonary disease.   Electronically Signed   By: Lucienne Capers M.D.   On: 04/18/2014 00:38    Lab Results: Basic Metabolic Panel:  Recent Labs  05/10/14 0625 05/11/14 0646  NA 146 142  K 3.2* 3.2*  CL 109 105  CO2 26 25  GLUCOSE 96 87  BUN 14 11  CREATININE 0.96 0.92  CALCIUM 8.7 8.8   Liver Function Tests:  Recent Labs  05/10/14 0625 05/11/14 0646  AST 30 44*  ALT 15 19  ALKPHOS 74 66  BILITOT 0.2* 0.3  PROT 6.6 6.9  ALBUMIN 2.2* 2.4*  CBC:  Recent Labs  05/11/14 0646 05/12/14 0610  WBC 7.2 8.9  NEUTROABS 3.9 6.0  HGB 9.5* 9.3*  HCT 28.7* 27.6*  MCV 88.3 88.2  PLT 234 232    Recent Results (from the past 240 hour(s))  Blood culture (routine x 2)     Status: None (Preliminary result)   Collection Time: 05/06/14 11:51 PM  Result Value Ref Range Status   Specimen Description BLOOD RIGHT ANTECUBITAL  Final   Special Requests BOTTLES DRAWN AEROBIC AND ANAEROBIC [place]  Final   Culture NO GROWTH 4 DAYS  Final   Report Status PENDING  Incomplete  Blood culture (routine x 2)     Status: None (Preliminary result)   Collection Time: 05/06/14 11:56 PM  Result Value Ref Range Status   Specimen Description BLOOD RIGHT ARM  Final   Special Requests BOTTLES DRAWN AEROBIC AND ANAEROBIC 10CC EACH  Final   Culture NO GROWTH 4 DAYS  Final   Report Status PENDING  Incomplete  Urine culture     Status: None   Collection Time: 05/07/14 12:38 AM  Result Value Ref Range Status   Specimen Description URINE, CATHETERIZED  Final   Special Requests NONE  Final   Culture  Setup Time   Final    05/07/2014 21:41 Performed at [place] Performed at [facility]   Final   Culture NO GROWTH Performed at [facility]   Final   Report Status 05/08/2014 FINAL  Final  MRSA PCR Screening     Status: Abnormal   Collection Time: 05/07/14  4:50 AM  Result Value Ref Range Status   MRSA by PCR POSITIVE (A) NEGATIVE Final    Comment: CRITICAL RESULT CALLED TO, READ BACK BY AND VERIFIED WITH: FERGUSON,M AT 10:20AM ON 05/07/14 BY FESTERMAN,C        The GeneXpert MRSA Assay (FDA approved for NASAL specimens only), is one component of a comprehensive MRSA colonization surveillance program. It is not intended to diagnose MRSA infection nor to guide or monitor treatment for MRSA infections.   Clostridium Difficile by PCR     Status: Abnormal   Collection Time: 05/07/14 11:39 PM  Result Value Ref Range Status   C difficile by pcr POSITIVE (A) NEGATIVE Final    Comment: CRITICAL RESULT CALLED TO, READ BACK BY AND VERIFIED WITH: Rockne Coons AT [place] [id number] BY [facility] Course:  This is a 79 years old female with history of multiple medical illnesses was admitted due to fever and change in Mental status. Patient was started on combination IV antibiotics. She had C.Diffi toxin in her stool. The rest of her cultures were negative. Her fever subsided, but patient remained confused and disoriented. Arrangement is made to transfer patient to nursing home for skilled nursing care and physical therapy.  Discharge Exam: Blood pressure 158/62, pulse 65, temperature 98.5 F (36.9 C), temperature source Oral, resp. rate 20, height 5\' 7"  (1.702 m), weight 50.7 kg (111 lb 12.4 oz), SpO2 100 %.   Disposition:  Nursing home.      Signed: Marvelle Span   05/12/2014, 8:09 AM

## 2014-05-28 ENCOUNTER — Other Ambulatory Visit (HOSPITAL_COMMUNITY): Payer: Self-pay | Admitting: Internal Medicine

## 2014-05-28 DIAGNOSIS — M5489 Other dorsalgia: Secondary | ICD-10-CM

## 2014-06-02 ENCOUNTER — Encounter (HOSPITAL_COMMUNITY): Payer: Self-pay | Admitting: Emergency Medicine

## 2014-06-02 ENCOUNTER — Emergency Department (HOSPITAL_COMMUNITY)
Admission: EM | Admit: 2014-06-02 | Discharge: 2014-06-03 | Disposition: A | Discharge status: Home / Self Care | Payer: Medicare Other | Attending: Emergency Medicine | Admitting: Emergency Medicine

## 2014-06-02 ENCOUNTER — Ambulatory Visit (HOSPITAL_COMMUNITY)
Admission: RE | Admit: 2014-06-02 | Discharge: 2014-06-02 | Disposition: A | Discharge status: Home / Self Care | Payer: Medicare Other | Source: Ambulatory Visit | Attending: Internal Medicine | Admitting: Internal Medicine

## 2014-06-02 DIAGNOSIS — J449 Chronic obstructive pulmonary disease, unspecified: Secondary | ICD-10-CM | POA: Insufficient documentation

## 2014-06-02 DIAGNOSIS — M5489 Other dorsalgia: Secondary | ICD-10-CM

## 2014-06-02 DIAGNOSIS — Z87891 Personal history of nicotine dependence: Secondary | ICD-10-CM | POA: Insufficient documentation

## 2014-06-02 DIAGNOSIS — R262 Difficulty in walking, not elsewhere classified: Secondary | ICD-10-CM | POA: Insufficient documentation

## 2014-06-02 DIAGNOSIS — Z79899 Other long term (current) drug therapy: Secondary | ICD-10-CM | POA: Insufficient documentation

## 2014-06-02 DIAGNOSIS — I10 Essential (primary) hypertension: Secondary | ICD-10-CM | POA: Insufficient documentation

## 2014-06-02 DIAGNOSIS — M4646 Discitis, unspecified, lumbar region: Secondary | ICD-10-CM | POA: Diagnosis not present

## 2014-06-02 DIAGNOSIS — Z792 Long term (current) use of antibiotics: Secondary | ICD-10-CM | POA: Diagnosis not present

## 2014-06-02 DIAGNOSIS — K8020 Calculus of gallbladder without cholecystitis without obstruction: Secondary | ICD-10-CM | POA: Insufficient documentation

## 2014-06-02 DIAGNOSIS — M4627 Osteomyelitis of vertebra, lumbosacral region: Secondary | ICD-10-CM | POA: Diagnosis not present

## 2014-06-02 DIAGNOSIS — Z87440 Personal history of urinary (tract) infections: Secondary | ICD-10-CM | POA: Insufficient documentation

## 2014-06-02 DIAGNOSIS — M545 Low back pain: Secondary | ICD-10-CM | POA: Diagnosis present

## 2014-06-02 DIAGNOSIS — M869 Osteomyelitis, unspecified: Secondary | ICD-10-CM

## 2014-06-02 DIAGNOSIS — M4647 Discitis, unspecified, lumbosacral region: Secondary | ICD-10-CM | POA: Insufficient documentation

## 2014-06-02 LAB — URINALYSIS, ROUTINE W REFLEX MICROSCOPIC
BILIRUBIN URINE: NEGATIVE
Glucose, UA: NEGATIVE mg/dL
Hgb urine dipstick: NEGATIVE
Ketones, ur: NEGATIVE mg/dL
NITRITE: NEGATIVE
PROTEIN: NEGATIVE mg/dL
Specific Gravity, Urine: 1.02 (ref 1.005–1.030)
Urobilinogen, UA: 0.2 mg/dL (ref 0.0–1.0)
pH: 5 (ref 5.0–8.0)

## 2014-06-02 LAB — CBC WITH DIFFERENTIAL/PLATELET
BASOS ABS: 0 10*3/uL (ref 0.0–0.1)
BASOS PCT: 0 % (ref 0–1)
Eosinophils Absolute: 0.1 10*3/uL (ref 0.0–0.7)
Eosinophils Relative: 2 % (ref 0–5)
HEMATOCRIT: 31.2 % — AB (ref 36.0–46.0)
Hemoglobin: 10 g/dL — ABNORMAL LOW (ref 12.0–15.0)
Lymphocytes Relative: 39 % (ref 12–46)
Lymphs Abs: 1.7 10*3/uL (ref 0.7–4.0)
MCH: 29.2 pg (ref 26.0–34.0)
MCHC: 32.1 g/dL (ref 30.0–36.0)
MCV: 91.2 fL (ref 78.0–100.0)
MONO ABS: 0.3 10*3/uL (ref 0.1–1.0)
Monocytes Relative: 7 % (ref 3–12)
NEUTROS ABS: 2.3 10*3/uL (ref 1.7–7.7)
NEUTROS PCT: 52 % (ref 43–77)
Platelets: 178 10*3/uL (ref 150–400)
RBC: 3.42 MIL/uL — ABNORMAL LOW (ref 3.87–5.11)
RDW: 14.9 % (ref 11.5–15.5)
WBC: 4.3 10*3/uL (ref 4.0–10.5)

## 2014-06-02 LAB — COMPREHENSIVE METABOLIC PANEL
ALT: 30 U/L (ref 0–35)
AST: 41 U/L — AB (ref 0–37)
Albumin: 3.3 g/dL — ABNORMAL LOW (ref 3.5–5.2)
Alkaline Phosphatase: 75 U/L (ref 39–117)
Anion gap: 6 (ref 5–15)
BUN: 38 mg/dL — ABNORMAL HIGH (ref 6–23)
CALCIUM: 9.2 mg/dL (ref 8.4–10.5)
CO2: 25 mmol/L (ref 19–32)
Chloride: 109 mEq/L (ref 96–112)
Creatinine, Ser: 1.53 mg/dL — ABNORMAL HIGH (ref 0.50–1.10)
GFR calc Af Amer: 37 mL/min — ABNORMAL LOW (ref >90)
GFR calc non Af Amer: 32 mL/min — ABNORMAL LOW (ref >90)
Glucose, Bld: 96 mg/dL (ref 70–99)
Potassium: 4.2 mmol/L (ref 3.5–5.1)
SODIUM: 140 mmol/L (ref 135–145)
TOTAL PROTEIN: 7.6 g/dL (ref 6.0–8.3)
Total Bilirubin: 0.3 mg/dL (ref 0.3–1.2)

## 2014-06-02 LAB — URINE MICROSCOPIC-ADD ON

## 2014-06-02 LAB — I-STAT CG4 LACTIC ACID, ED: LACTIC ACID, VENOUS: 0.7 mmol/L (ref 0.5–2.2)

## 2014-06-02 LAB — SEDIMENTATION RATE: SED RATE: 63 mm/h — AB (ref 0–22)

## 2014-06-02 MED ORDER — PIPERACILLIN-TAZOBACTAM 3.375 G IVPB: 3.3750 g | Freq: Three times a day (TID) | INTRAVENOUS | Status: AC

## 2014-06-02 MED ORDER — VANCOMYCIN HCL 1000 MG IV SOLR: 1000.0000 mg | INTRAVENOUS | Status: AC

## 2014-06-02 MED ORDER — VANCOMYCIN HCL IN DEXTROSE 1-5 GM/200ML-% IV SOLN: 1000.0000 mg | INTRAVENOUS | Status: DC

## 2014-06-02 MED ORDER — VANCOMYCIN HCL IN DEXTROSE 1-5 GM/200ML-% IV SOLN
1000.0000 mg | Freq: Once | INTRAVENOUS | Status: AC
  Administered 2014-06-02: 1000 mg via INTRAVENOUS
  Filled 2014-06-02: qty 200

## 2014-06-02 MED ORDER — PIPERACILLIN-TAZOBACTAM 3.375 G IVPB
3.3750 g | Freq: Three times a day (TID) | INTRAVENOUS | Status: DC
  Administered 2014-06-02: 3.375 g via INTRAVENOUS
  Filled 2014-06-02: qty 50

## 2014-06-02 MED ORDER — SODIUM CHLORIDE 0.9 % IV SOLN
INTRAVENOUS | Status: DC
  Administered 2014-06-02: 17:00:00 via INTRAVENOUS

## 2014-06-02 NOTE — Progress Notes (Signed)
ANTIBIOTIC CONSULT NOTE - INITIAL  Pharmacy Consult for Vancomycin and Zosyn Indication: discitis  No Known Allergies  Patient Measurements: Height: 5' 7.5" (171.5 cm) Weight: 126 lb (57.153 kg) IBW/kg (Calculated) : 62.75  Vital Signs: Temp: 98.1 F (36.7 C) (12/22 1641) Temp Source: Oral (12/22 1641) BP: 239/93 mmHg (12/22 1720) Pulse Rate: 72 (12/22 1720) Intake/Output from previous day:   Intake/Output from this shift:    Labs:  Recent Labs  06/02/14 1642 06/02/14 1648  WBC  --  4.3  HGB  --  10.0*  PLT  --  178  CREATININE 1.53*  --    Estimated Creatinine Clearance: 27.8 mL/min (by C-G formula based on Cr of 1.53). No results for input(s): VANCOTROUGH, VANCOPEAK, VANCORANDOM, GENTTROUGH, GENTPEAK, GENTRANDOM, TOBRATROUGH, TOBRAPEAK, TOBRARND, AMIKACINPEAK, AMIKACINTROU, AMIKACIN in the last 72 hours.   Microbiology: Recent Results (from the past 720 hour(s))  Blood culture (routine x 2)     Status: None   Collection Time: 05/06/14 11:51 PM  Result Value Ref Range Status   Specimen Description BLOOD RIGHT ANTECUBITAL  Final   Special Requests BOTTLES DRAWN AEROBIC AND ANAEROBIC [place]  Final   Culture NO GROWTH 5 DAYS  Final   Report Status 05/12/2014 FINAL  Final  Blood culture (routine x 2)     Status: None   Collection Time: 05/06/14 11:56 PM  Result Value Ref Range Status   Specimen Description BLOOD RIGHT ARM  Final   Special Requests BOTTLES DRAWN AEROBIC AND ANAEROBIC 10CC EACH  Final   Culture NO GROWTH 5 DAYS  Final   Report Status 05/12/2014 FINAL  Final  Urine culture     Status: None   Collection Time: 05/07/14 12:38 AM  Result Value Ref Range Status   Specimen Description URINE, CATHETERIZED  Final   Special Requests NONE  Final   Culture  Setup Time   Final    05/07/2014 21:41 Performed at [place] Performed at [facility]   Final   Culture NO GROWTH Performed at [employer]   Final   Report Status 05/08/2014 FINAL  Final  MRSA PCR Screening     Status: Abnormal   Collection Time: 05/07/14  4:50 AM  Result Value Ref Range Status   MRSA by PCR POSITIVE (A) NEGATIVE Final    Comment: CRITICAL RESULT CALLED TO, READ BACK BY AND VERIFIED WITH: FERGUSON,M AT 10:20AM ON 05/07/14 BY FESTERMAN,C        The GeneXpert MRSA Assay (FDA approved for NASAL specimens only), is one component of a comprehensive MRSA colonization surveillance program. It is not intended to diagnose MRSA infection nor to guide or monitor treatment for MRSA infections.   Clostridium Difficile by PCR     Status: Abnormal   Collection Time: 05/07/14 11:39 PM  Result Value Ref Range Status   C difficile by pcr POSITIVE (A) NEGATIVE Final    Comment: CRITICAL RESULT CALLED TO, READ BACK BY AND VERIFIED WITH: Rockne Coons AT 0123 ON [id number] BY FORSYTH K    Medical History: Past Medical History  Diagnosis Date  . COPD (chronic obstructive pulmonary disease)   . Hypertension   . Pyelonephritis 04/06/2014    UTI   Anti-infectives    Start     Dose/Rate Route Frequency Ordered Stop   06/03/14 1800  vancomycin (VANCOCIN) IVPB 1000 mg/200 mL premix     1,000 mg200 mL/hr over 60 Minutes Intravenous Every  24 hours 06/02/14 2105     06/02/14 2200  piperacillin-tazobactam (ZOSYN) IVPB 3.375 g     3.375 g12.5 mL/hr over 240 Minutes Intravenous Every 8 hours 06/02/14 2059     06/02/14 2115  vancomycin (VANCOCIN) IVPB 1000 mg/200 mL premix     1,000 mg200 mL/hr over 60 Minutes Intravenous  Once 06/02/14 2100       Assessment: 79yo female admitted with osteomyelitis of lumbar spine.  SCr elevated on admission.  Estimated Creatinine Clearance: 27.8 mL/min (by C-G formula based on Cr of 1.53). Pt was recently on Vancomycin and required 1gm q24h  Goal of Therapy:  Vancomycin trough level 15-20 mcg/ml  Plan:  Vancomycin 1gm IV q24hrs Check trough at steady state Zosyn 3.375gm IV q8h,  each dose over 4 hrs Monitor labs, renal fxn, progress, and cultures  Hart Robinsons A 06/02/2014,9:07 PM

## 2014-06-02 NOTE — ED Notes (Signed)
PT sent from [facility] (SNF) d/t MRI results today showed osteomyelitis of lumbar spine and sent to ED for further evaluation by Dr. Legrand Rams. PT alert and oriented and ambulatory and has had lower back pain x3 months. No open areas noted to lumbar spine and pt denies any fever.

## 2014-06-02 NOTE — ED Notes (Signed)
Report called to [facility] supervisor at [place]. Informed her that the patient would be receiving IV antibiotics there when we discharge her. Asked to leave IV site in place.

## 2014-06-02 NOTE — ED Provider Notes (Signed)
CSN: [id number]     Arrival date & time 06/02/14  1632 History   This chart was scribed for Lisa Blade, MD by [name], ED Scribe. This patient was seen in room APA16A/APA16A and the patient's care was started at 6:14 PM.    Chief Complaint  Patient presents with  . Osteomyelitis    The history is provided by the patient. No language interpreter was used.     HPI Comments: Lisa [name] is a 79 y.o. female with a Hx of HTN who presents to the Emergency Department complaining of constant lower back pain and difficulty walking that has been present for 3-4 months. Pt states that she had an MRI this morning, with her results indicating Lumbar Osteomyelitis. Pt states that 3-4 months ago her "legs gave out completely", and now only walks 3x per day under the supervision of someone from her nursing home. Pt states she is able to ambulate whenever she pleases, but notes that she has fallen a few times while trying to do so. Pt also states that she uses a walker to ambulate sometimes. Pt denies fever, cough, chest pain, abdominal pain, and constipation.      Past Medical History  Diagnosis Date  . COPD (chronic obstructive pulmonary disease)   . Hypertension   . Pyelonephritis 04/06/2014    UTI   Past Surgical History  Procedure Laterality Date  . Abdominal hysterectomy    . Tee without cardioversion N/A 04/10/2014    Procedure: TRANSESOPHAGEAL ECHOCARDIOGRAM (TEE);  Surgeon: [name], MD;  Location: [facility] ENDOSCOPY;  Service: Cardiovascular;  Laterality: N/A;   Family History  Problem Relation Age of Onset  . Hypertension Mother   . Hypertension Father    History  Substance Use Topics  . Smoking status: Former Smoker    Types: Cigarettes    Quit date: 05/12/2014  . Smokeless tobacco: Never Used  . Alcohol Use: No   OB History    No data available     Review of Systems  Constitutional: Negative for fever.  Respiratory: Negative for cough.   Cardiovascular:  Negative for chest pain.  Gastrointestinal: Negative for abdominal pain and constipation.  Musculoskeletal: Positive for myalgias, back pain and arthralgias.  All other systems reviewed and are negative.     Allergies  Review of patient's allergies indicates no known allergies.  Home Medications   Prior to Admission medications   Medication Sig Start Date End Date Taking? Authorizing Provider  acetaminophen (TYLENOL) 500 MG tablet Take 500 mg by mouth every 6 (six) hours as needed for headache.   Yes Historical Provider, MD  B Complex Vitamins (VITAMIN-B COMPLEX PO) Take 1 tablet by mouth daily.   Yes Historical Provider, MD  cloNIDine (CATAPRES) 0.2 MG tablet Take 0.2 mg by mouth 2 (two) times daily.   Yes Historical Provider, MD  feeding supplement, ENSURE COMPLETE, (ENSURE COMPLETE) LIQD Take 237 mLs by mouth 2 (two) times daily between meals. 04/15/14  Yes [name], DO  Ipratropium-Albuterol (COMBIVENT RESPIMAT) 20-100 MCG/ACT AERS respimat Inhale 1 puff into the lungs every 6 (six) hours.   Yes Historical Provider, MD  ipratropium-albuterol (DUONEB) 0.5-2.5 (3) MG/3ML SOLN Take 3 mLs by nebulization every 6 (six) hours as needed. For shortness of breath   Yes Historical Provider, MD  labetalol (NORMODYNE) 300 MG tablet Take 300 mg by mouth 2 (two) times daily.     Yes Historical Provider, MD  LORazepam (ATIVAN) 0.5 MG tablet Take 1 tablet (  0.5 mg total) by mouth every 8 (eight) hours as needed for anxiety. 05/12/14  Yes [name], MD  losartan-hydrochlorothiazide (HYZAAR) 100-25 MG per tablet Take 1 tablet by mouth daily. 03/16/14  Yes Historical Provider, MD  mirtazapine (REMERON) 15 MG tablet Take 15 mg by mouth at bedtime.   Yes Historical Provider, MD  Multiple Vitamins-Minerals (CENTRUM ADULTS PO) Take 1 tablet by mouth daily.   Yes Historical Provider, MD  [name] (OYSTER CALCIUM) 500 MG TABS tablet Take 500 mg of elemental calcium by mouth daily.   Yes Historical  Provider, MD  potassium chloride SA (K-DUR,KLOR-CON) 20 MEQ tablet Take 2 tablets (40 mEq total) by mouth once. Patient taking differently: Take 40 mEq by mouth daily.  05/12/14  Yes [name], MD  traMADol (ULTRAM) 50 MG tablet Take 1 tablet (50 mg total) by mouth every 6 (six) hours as needed for moderate pain. 05/12/14  Yes [name], MD  piperacillin-tazobactam (ZOSYN) 3.375 GM/50ML IVPB Inject 50 mLs (3.375 g total) into the vein every 8 (eight) hours. 06/02/14 07/03/14  Lisa Blade, MD  vancomycin 1,000 mg in sodium chloride 0.9 % 250 mL Inject 1,000 mg into the vein daily. 06/02/14 07/03/14  Lisa Blade, MD   BP 222/77 mmHg  Pulse 74  Temp(Src) 98.1 F (36.7 C) (Oral)  Resp 18  Ht 5' 7.5" (1.715 m)  Wt 126 lb (57.153 kg)  BMI 19.43 kg/m2  SpO2 100% Physical Exam  Constitutional: She is oriented to person, place, and time. She appears well-developed and well-nourished.  HENT:  Head: Normocephalic and atraumatic.  Eyes: Conjunctivae and EOM are normal. Pupils are equal, round, and reactive to light.  Neck: Normal range of motion and phonation normal. Neck supple.  Cardiovascular: Normal rate and regular rhythm.   Pulmonary/Chest: Effort normal and breath sounds normal. She exhibits no tenderness.  Abdominal: Soft. She exhibits no distension. There is no tenderness. There is no guarding.  Musculoskeletal: Normal range of motion.  Complains of pain in lumbar area when she sits up  Neurological: She is alert and oriented to person, place, and time. She exhibits normal muscle tone.  Skin: Skin is warm and dry.  Psychiatric: She has a normal mood and affect. Her behavior is normal. Judgment and thought content normal.  Nursing note and vitals reviewed.   ED Course  Procedures (including critical care time)  DIAGNOSTIC STUDIES: Oxygen Saturation is 100% on RA, normal by my interpretation.    COORDINATION OF CARE: 6:18 PM Discussed treatment plan with pt at bedside  and pt agreed to plan.  Medications  vancomycin (VANCOCIN) IVPB 1000 mg/200 mL premix (0 mg Intravenous Stopped 06/02/14 2229)  Zosyn also given in ED  No data found.     Labs Review Labs Reviewed  CBC WITH DIFFERENTIAL - Abnormal; Notable for the following:    RBC 3.42 (*)    Hemoglobin 10.0 (*)    HCT 31.2 (*)    All other components within normal limits  URINALYSIS, ROUTINE W REFLEX MICROSCOPIC - Abnormal; Notable for the following:    Leukocytes, UA SMALL (*)    All other components within normal limits  SEDIMENTATION RATE - Abnormal; Notable for the following:    Sed Rate 63 (*)    All other components within normal limits  COMPREHENSIVE METABOLIC PANEL - Abnormal; Notable for the following:    BUN 38 (*)    Creatinine, Ser 1.53 (*)    Albumin 3.3 (*)  AST 41 (*)    GFR calc non Af Amer 32 (*)    GFR calc Af Amer 37 (*)    All other components within normal limits  URINE MICROSCOPIC-ADD ON - Abnormal; Notable for the following:    Squamous Epithelial / LPF FEW (*)    All other components within normal limits  URINE CULTURE  CULTURE, BLOOD (ROUTINE X 2)  CULTURE, BLOOD (ROUTINE X 2)  I-STAT CG4 LACTIC ACID, ED    Imaging Review No results found.   EKG Interpretation None      MDM   Final diagnoses:  Osteomyelitis  Discitis of lumbar region    Disciitis which will require prolonged IV ABX for treatment. No acute myelopathy clinically or on Imaging.   Nursing Notes Reviewed/ Care Coordinated Applicable Imaging Reviewed Interpretation of Laboratory Data incorporated into ED treatment  Nursing Notes Reviewed/ Care Coordinated Applicable Imaging Reviewed Interpretation of Laboratory Data incorporated into ED treatment  The patient appears reasonably screened and/or stabilized for discharge and I doubt any other medical condition or other [facility] requiring further screening, evaluation, or treatment in the ED at this time prior to discharge.  Plan: Home  Medications- Zosyn and Vancomycin for 1 month; Home Treatments- rest; return here if the recommended treatment, does not improve the symptoms; Recommended follow up- Neurosurgery f/u in 2 weeks  I personally performed the services described in this documentation, which was scribed in my presence. The recorded information has been reviewed and is accurate.       Lisa Blade, MD 06/04/14 1140

## 2014-06-02 NOTE — ED Notes (Signed)
PT stated she did not receive any of her medications today d/t was at her MRI appointment.

## 2014-06-02 NOTE — Discharge Instructions (Signed)
There is an infection in your lumbar vertebrae, and lumbar discs.  This needs to be treated with antibiotics for 1 month.  You will need to follow-up with the neurosurgeon, Dr. Ellene Route, in his office in 2 weeks.  Call tomorrow for an appointment.  Return here if needed, for problems.

## 2014-06-03 ENCOUNTER — Ambulatory Visit (HOSPITAL_COMMUNITY)
Admission: RE | Admit: 2014-06-03 | Discharge: 2014-06-03 | Disposition: A | Discharge status: Home / Self Care | Payer: Medicare Other | Source: Ambulatory Visit | Attending: Internal Medicine | Admitting: Internal Medicine

## 2014-06-03 DIAGNOSIS — M4646 Discitis, unspecified, lumbar region: Secondary | ICD-10-CM | POA: Diagnosis present

## 2014-06-03 DIAGNOSIS — M869 Osteomyelitis, unspecified: Secondary | ICD-10-CM | POA: Insufficient documentation

## 2014-06-03 DIAGNOSIS — Z452 Encounter for adjustment and management of vascular access device: Secondary | ICD-10-CM | POA: Insufficient documentation

## 2014-06-03 MED ORDER — SODIUM CHLORIDE 0.9 % IJ SOLN: 10.0000 mL | INTRAMUSCULAR | Status: DC | PRN

## 2014-06-03 MED ORDER — SODIUM CHLORIDE 0.9 % IJ SOLN: 10.0000 mL | Freq: Two times a day (BID) | INTRAMUSCULAR | Status: DC

## 2014-06-03 NOTE — ED Notes (Signed)
Patient awaiting transport back to [facility]

## 2014-06-03 NOTE — Progress Notes (Signed)
Reported to [facility] LPN

## 2014-06-03 NOTE — Discharge Instructions (Signed)
PICC Home Guide A peripherally inserted central catheter (PICC) is a long, thin, flexible tube that is inserted into a vein in the upper arm. It is a form of intravenous (IV) access. It is considered to be a "central" line because the tip of the PICC ends in a large vein in your chest. This large vein is called the superior vena cava (SVC). The PICC tip ends in the SVC because there is a lot of blood flow in the SVC. This allows medicines and IV fluids to be quickly distributed throughout the body. The PICC is inserted using a sterile technique by a specially trained nurse or physician. After the PICC is inserted, a chest X-ray exam is done to be sure it is in the correct place.  A PICC may be placed for different reasons, such as:  To give medicines and liquid nutrition that can only be given through a central line. Examples are:  Certain antibiotic treatments.  Chemotherapy.  Total parenteral nutrition (TPN).  To take frequent blood samples.  To give IV fluids and blood products.  If there is difficulty placing a peripheral intravenous (PIV) catheter. If taken care of properly, a PICC can remain in place for several months. A PICC can also allow a person to go home from the hospital early. Medicine and PICC care can be managed at home by a family member or home health care team. [place] A PICC? Problems with a PICC can occasionally occur. These may include the following:  A blood clot (thrombus) forming in or at the tip of the PICC. This can cause the PICC to become clogged. A clot-dissolving medicine called tissue plasminogen activator (tPA) can be given through the PICC to help break up the clot.  Inflammation of the vein (phlebitis) in which the PICC is placed. Signs of inflammation may include redness, pain at the insertion site, red streaks, or being able to feel a "cord" in the vein where the PICC is located.  Infection in the PICC or at the insertion  site. Signs of infection may include fever, chills, redness, swelling, or pus drainage from the PICC insertion site.  PICC movement (malposition). The PICC tip may move from its original position due to excessive physical activity, forceful coughing, sneezing, or vomiting.  A break or cut in the PICC. It is important to not use scissors near the PICC.  Nerve or tendon irritation or injury during PICC insertion. WHAT SHOULD I KEEP IN MIND ABOUT ACTIVITIES WHEN I HAVE A PICC?  You may bend your arm and move it freely. If your PICC is near or at the bend of your elbow, avoid activity with repeated motion at the elbow.  Rest at home for the remainder of the day following PICC line insertion.  Avoid lifting heavy objects as instructed by your health care provider.  Avoid using a crutch with the arm on the same side as your PICC. You may need to use a walker. WHAT SHOULD I KNOW ABOUT MY PICC DRESSING?  Keep your PICC bandage (dressing) clean and dry to prevent infection.  Ask your health care provider when you may shower. Ask your health care provider to teach you how to wrap the PICC when you do take a shower.  Change the PICC dressing as instructed by your health care provider.  Change your PICC dressing if it becomes loose or wet. WHAT SHOULD I KNOW ABOUT PICC CARE?  Check the PICC insertion site  daily for leakage, redness, swelling, or pain.  Do not take a bath, swim, or use hot tubs when you have a PICC. Cover PICC line with clear plastic wrap and tape to keep it dry while showering.  Flush the PICC as directed by your health care provider. Let your health care provider know right away if the PICC is difficult to flush or does not flush. Do not use force to flush the PICC.  Do not use a syringe that is less than 10 mL to flush the PICC.  Never pull or tug on the PICC.  Avoid blood pressure checks on the arm with the PICC.  Keep your PICC identification card with you at all  times.  Do not take the PICC out yourself. Only a trained clinical professional should remove the PICC. SEEK IMMEDIATE MEDICAL CARE IF:  Your PICC is accidentally pulled all the way out. If this happens, cover the insertion site with a bandage or gauze dressing. Do not throw the PICC away. Your health care provider will need to inspect it.  Your PICC was tugged or pulled and has partially come out. Do not  push the PICC back in.  There is any type of drainage, redness, or swelling where the PICC enters the skin.  You cannot flush the PICC, it is difficult to flush, or the PICC leaks around the insertion site when it is flushed.  You hear a "flushing" sound when the PICC is flushed.  You have pain, discomfort, or numbness in your arm, shoulder, or jaw on the same side as the PICC.  You feel your heart "racing" or skipping beats.  You notice a hole or tear in the PICC.  You develop chills or a fever. MAKE SURE YOU:   Understand these instructions.  Will watch your condition.  Will get help right away if you are not doing well or get worse. Document Released: 12/03/2002 Document Revised: 10/13/2013 Document Reviewed: 02/03/2013 [facility] Patient Information 2015 [place], [place]. This information is not intended to replace advice given to you by your health care provider. Make sure you discuss any questions you have with your health care provider.

## 2014-06-03 NOTE — Discharge Instructions (Signed)
PICC Home Guide A peripherally inserted central catheter (PICC) is a long, thin, flexible tube that is inserted into a vein in the upper arm. It is a form of intravenous (IV) access. It is considered to be a "central" line because the tip of the PICC ends in a large vein in your chest. This large vein is called the superior vena cava (SVC). The PICC tip ends in the SVC because there is a lot of blood flow in the SVC. This allows medicines and IV fluids to be quickly distributed throughout the body. The PICC is inserted using a sterile technique by a specially trained nurse or physician. After the PICC is inserted, a chest X-ray exam is done to be sure it is in the correct place.  A PICC may be placed for different reasons, such as:  To give medicines and liquid nutrition that can only be given through a central line. Examples are:  Certain antibiotic treatments.  Chemotherapy.  Total parenteral nutrition (TPN).  To take frequent blood samples.  To give IV fluids and blood products.  If there is difficulty placing a peripheral intravenous (PIV) catheter. If taken care of properly, a PICC can remain in place for several months. A PICC can also allow a person to go home from the hospital early. Medicine and PICC care can be managed at home by a family member or home health care team. [place] A PICC? Problems with a PICC can occasionally occur. These may include the following:  A blood clot (thrombus) forming in or at the tip of the PICC. This can cause the PICC to become clogged. A clot-dissolving medicine called tissue plasminogen activator (tPA) can be given through the PICC to help break up the clot.  Inflammation of the vein (phlebitis) in which the PICC is placed. Signs of inflammation may include redness, pain at the insertion site, red streaks, or being able to feel a "cord" in the vein where the PICC is located.  Infection in the PICC or at the insertion  site. Signs of infection may include fever, chills, redness, swelling, or pus drainage from the PICC insertion site.  PICC movement (malposition). The PICC tip may move from its original position due to excessive physical activity, forceful coughing, sneezing, or vomiting.  A break or cut in the PICC. It is important to not use scissors near the PICC.  Nerve or tendon irritation or injury during PICC insertion. WHAT SHOULD I KEEP IN MIND ABOUT ACTIVITIES WHEN I HAVE A PICC?  You may bend your arm and move it freely. If your PICC is near or at the bend of your elbow, avoid activity with repeated motion at the elbow.  Rest at home for the remainder of the day following PICC line insertion.  Avoid lifting heavy objects as instructed by your health care provider.  Avoid using a crutch with the arm on the same side as your PICC. You may need to use a walker. WHAT SHOULD I KNOW ABOUT MY PICC DRESSING?  Keep your PICC bandage (dressing) clean and dry to prevent infection.  Ask your health care provider when you may shower. Ask your health care provider to teach you how to wrap the PICC when you do take a shower.  Change the PICC dressing as instructed by your health care provider.  Change your PICC dressing if it becomes loose or wet. WHAT SHOULD I KNOW ABOUT PICC CARE?  Check the PICC insertion site  daily for leakage, redness, swelling, or pain.  Do not take a bath, swim, or use hot tubs when you have a PICC. Cover PICC line with clear plastic wrap and tape to keep it dry while showering.  Flush the PICC as directed by your health care provider. Let your health care provider know right away if the PICC is difficult to flush or does not flush. Do not use force to flush the PICC.  Do not use a syringe that is less than 10 mL to flush the PICC.  Never pull or tug on the PICC.  Avoid blood pressure checks on the arm with the PICC.  Keep your PICC identification card with you at all  times.  Do not take the PICC out yourself. Only a trained clinical professional should remove the PICC. SEEK IMMEDIATE MEDICAL CARE IF:  Your PICC is accidentally pulled all the way out. If this happens, cover the insertion site with a bandage or gauze dressing. Do not throw the PICC away. Your health care provider will need to inspect it.  Your PICC was tugged or pulled and has partially come out. Do not  push the PICC back in.  There is any type of drainage, redness, or swelling where the PICC enters the skin.  You cannot flush the PICC, it is difficult to flush, or the PICC leaks around the insertion site when it is flushed.  You hear a "flushing" sound when the PICC is flushed.  You have pain, discomfort, or numbness in your arm, shoulder, or jaw on the same side as the PICC.  You feel your heart "racing" or skipping beats.  You notice a hole or tear in the PICC.  You develop chills or a fever. MAKE SURE YOU:   Understand these instructions.  Will watch your condition.  Will get help right away if you are not doing well or get worse. Document Released: 12/03/2002 Document Revised: 10/13/2013 Document Reviewed: 02/03/2013 [facility] Patient Information 2015 [place], [place]. This information is not intended to replace advice given to you by your health care provider. Make sure you discuss any questions you have with your health care provider.

## 2014-06-03 NOTE — Progress Notes (Signed)
Peripherally Inserted Central Catheter/Midline Placement  The IV Nurse has discussed with the patient and/or persons authorized to consent for the patient, the purpose of this procedure and the potential benefits and risks involved with this procedure.  The benefits include less needle sticks, lab draws from the catheter and patient may be discharged home with the catheter.  Risks include, but not limited to, infection, bleeding, blood clot (thrombus formation), and puncture of an artery; nerve damage and irregular heat beat.  Alternatives to this procedure were also discussed.  PICC/Midline Placement Documentation  PICC / Midline Single Lumen 81/77/11 PICC Right Basilic 42 cm 1 cm (Active)  Indication for Insertion or Continuance of Line Prolonged intravenous therapies;Limited venous access - need for IV therapy >5 days (PICC only) 06/03/2014 12:45 PM  Exposed Catheter (cm) 1 cm 06/03/2014 12:45 PM  Site Assessment Clean;Dry;Intact 06/03/2014 12:45 PM  Line Status Flushed;Saline locked;Blood return noted 06/03/2014 12:45 PM  Dressing Type Transparent 06/03/2014 12:45 PM  Dressing Status Clean;Dry;Intact 06/03/2014 12:45 PM  Dressing Change Due 06/10/14 06/03/2014 12:45 PM    picc line ready to use ECG technology used.   Roselind Messier 06/03/2014, 1:05 PM

## 2014-06-06 LAB — URINE CULTURE: Colony Count: >=100000

## 2014-06-07 ENCOUNTER — Telehealth (HOSPITAL_COMMUNITY): Payer: Self-pay

## 2014-06-07 LAB — CULTURE, BLOOD (ROUTINE X 2)
CULTURE: NO GROWTH
CULTURE: NO GROWTH

## 2014-06-07 NOTE — ED Notes (Signed)
Post ED Visit - Positive Culture Follow-up  Culture report reviewed by antimicrobial stewardship pharmacist: []  Wes [place], Pharm.D., BCPS []  Heide Guile, Pharm.D., BCPS [x]  Alycia Rossetti, Pharm.D., BCPS []  [place], Pharm.D., BCPS, AAHIVP []  Legrand Como, Pharm.D., BCPS, AAHIVP []  Isac Sarna, Pharm.D., BCPS  Positive urine culture Treated with Piperacillin IVPB, organism sensitive to the same and no further patient follow-up is required at this time.  Ileene Musa 06/07/2014, 5:18 PM

## 2014-06-11 ENCOUNTER — Other Ambulatory Visit (HOSPITAL_COMMUNITY): Payer: Self-pay | Admitting: Internal Medicine

## 2014-06-12 DIAGNOSIS — A047 Enterocolitis due to Clostridium difficile: Secondary | ICD-10-CM | POA: Diagnosis not present

## 2014-06-12 DIAGNOSIS — E049 Nontoxic goiter, unspecified: Secondary | ICD-10-CM | POA: Diagnosis not present

## 2014-06-12 DIAGNOSIS — N10 Acute tubulo-interstitial nephritis: Secondary | ICD-10-CM | POA: Diagnosis not present

## 2014-06-12 DIAGNOSIS — I10 Essential (primary) hypertension: Secondary | ICD-10-CM | POA: Diagnosis not present

## 2014-06-12 DIAGNOSIS — G9340 Encephalopathy, unspecified: Secondary | ICD-10-CM | POA: Diagnosis not present

## 2014-06-12 DIAGNOSIS — B954 Other streptococcus as the cause of diseases classified elsewhere: Secondary | ICD-10-CM | POA: Diagnosis not present

## 2014-06-12 DIAGNOSIS — R1310 Dysphagia, unspecified: Secondary | ICD-10-CM | POA: Diagnosis not present

## 2014-06-12 DIAGNOSIS — R109 Unspecified abdominal pain: Secondary | ICD-10-CM | POA: Diagnosis not present

## 2014-06-12 DIAGNOSIS — R41841 Cognitive communication deficit: Secondary | ICD-10-CM | POA: Diagnosis not present

## 2014-06-12 DIAGNOSIS — Z09 Encounter for follow-up examination after completed treatment for conditions other than malignant neoplasm: Secondary | ICD-10-CM | POA: Diagnosis not present

## 2014-06-12 DIAGNOSIS — A419 Sepsis, unspecified organism: Secondary | ICD-10-CM | POA: Diagnosis not present

## 2014-06-12 DIAGNOSIS — E568 Deficiency of other vitamins: Secondary | ICD-10-CM | POA: Diagnosis not present

## 2014-06-12 DIAGNOSIS — M6281 Muscle weakness (generalized): Secondary | ICD-10-CM | POA: Diagnosis not present

## 2014-06-12 DIAGNOSIS — M4620 Osteomyelitis of vertebra, site unspecified: Secondary | ICD-10-CM | POA: Diagnosis not present

## 2014-06-12 DIAGNOSIS — R1312 Dysphagia, oropharyngeal phase: Secondary | ICD-10-CM | POA: Diagnosis not present

## 2014-06-12 DIAGNOSIS — E860 Dehydration: Secondary | ICD-10-CM | POA: Diagnosis not present

## 2014-06-12 DIAGNOSIS — M4627 Osteomyelitis of vertebra, lumbosacral region: Secondary | ICD-10-CM | POA: Diagnosis not present

## 2014-06-12 DIAGNOSIS — M4647 Discitis, unspecified, lumbosacral region: Secondary | ICD-10-CM | POA: Diagnosis not present

## 2014-06-12 DIAGNOSIS — I5033 Acute on chronic diastolic (congestive) heart failure: Secondary | ICD-10-CM | POA: Diagnosis not present

## 2014-06-12 DIAGNOSIS — R29898 Other symptoms and signs involving the musculoskeletal system: Secondary | ICD-10-CM | POA: Diagnosis not present

## 2014-06-12 DIAGNOSIS — M549 Dorsalgia, unspecified: Secondary | ICD-10-CM | POA: Diagnosis not present

## 2014-06-12 DIAGNOSIS — R888 Abnormal findings in other body fluids and substances: Secondary | ICD-10-CM | POA: Diagnosis not present

## 2014-06-12 DIAGNOSIS — N390 Urinary tract infection, site not specified: Secondary | ICD-10-CM | POA: Diagnosis not present

## 2014-06-12 DIAGNOSIS — D72829 Elevated white blood cell count, unspecified: Secondary | ICD-10-CM | POA: Diagnosis not present

## 2014-06-12 DIAGNOSIS — E43 Unspecified severe protein-calorie malnutrition: Secondary | ICD-10-CM | POA: Diagnosis not present

## 2014-06-12 DIAGNOSIS — R262 Difficulty in walking, not elsewhere classified: Secondary | ICD-10-CM | POA: Diagnosis not present

## 2014-06-12 DIAGNOSIS — R2681 Unsteadiness on feet: Secondary | ICD-10-CM | POA: Diagnosis not present

## 2014-06-12 DIAGNOSIS — R509 Fever, unspecified: Secondary | ICD-10-CM | POA: Diagnosis not present

## 2014-06-12 DIAGNOSIS — N179 Acute kidney failure, unspecified: Secondary | ICD-10-CM | POA: Diagnosis not present

## 2014-06-12 DIAGNOSIS — E441 Mild protein-calorie malnutrition: Secondary | ICD-10-CM | POA: Diagnosis not present

## 2014-06-12 DIAGNOSIS — A412 Sepsis due to unspecified staphylococcus: Secondary | ICD-10-CM | POA: Diagnosis not present

## 2014-06-12 DIAGNOSIS — J449 Chronic obstructive pulmonary disease, unspecified: Secondary | ICD-10-CM | POA: Diagnosis not present

## 2014-07-08 DIAGNOSIS — G9340 Encephalopathy, unspecified: Secondary | ICD-10-CM | POA: Diagnosis not present

## 2014-07-08 DIAGNOSIS — A412 Sepsis due to unspecified staphylococcus: Secondary | ICD-10-CM | POA: Diagnosis not present

## 2014-07-08 DIAGNOSIS — A047 Enterocolitis due to Clostridium difficile: Secondary | ICD-10-CM | POA: Diagnosis not present

## 2014-07-10 ENCOUNTER — Other Ambulatory Visit (HOSPITAL_COMMUNITY): Payer: Self-pay | Admitting: Internal Medicine

## 2014-07-10 DIAGNOSIS — M869 Osteomyelitis, unspecified: Secondary | ICD-10-CM

## 2014-07-17 ENCOUNTER — Ambulatory Visit (HOSPITAL_COMMUNITY)
Admission: RE | Admit: 2014-07-17 | Discharge: 2014-07-17 | Disposition: A | Discharge status: Home / Self Care | Payer: Medicare Other | Source: Ambulatory Visit | Attending: Internal Medicine | Admitting: Internal Medicine

## 2014-07-17 DIAGNOSIS — Z09 Encounter for follow-up examination after completed treatment for conditions other than malignant neoplasm: Secondary | ICD-10-CM | POA: Diagnosis not present

## 2014-07-17 DIAGNOSIS — M869 Osteomyelitis, unspecified: Secondary | ICD-10-CM

## 2014-07-17 DIAGNOSIS — M4627 Osteomyelitis of vertebra, lumbosacral region: Secondary | ICD-10-CM | POA: Insufficient documentation

## 2014-07-17 DIAGNOSIS — M4647 Discitis, unspecified, lumbosacral region: Secondary | ICD-10-CM | POA: Diagnosis not present

## 2014-07-23 DIAGNOSIS — B954 Other streptococcus as the cause of diseases classified elsewhere: Secondary | ICD-10-CM | POA: Diagnosis not present

## 2014-07-23 DIAGNOSIS — N390 Urinary tract infection, site not specified: Secondary | ICD-10-CM | POA: Diagnosis not present

## 2014-07-23 DIAGNOSIS — R269 Unspecified abnormalities of gait and mobility: Secondary | ICD-10-CM | POA: Diagnosis not present

## 2014-07-28 DIAGNOSIS — R269 Unspecified abnormalities of gait and mobility: Secondary | ICD-10-CM | POA: Diagnosis not present

## 2014-07-28 DIAGNOSIS — B954 Other streptococcus as the cause of diseases classified elsewhere: Secondary | ICD-10-CM | POA: Diagnosis not present

## 2014-07-28 DIAGNOSIS — N390 Urinary tract infection, site not specified: Secondary | ICD-10-CM | POA: Diagnosis not present

## 2014-07-30 DIAGNOSIS — B954 Other streptococcus as the cause of diseases classified elsewhere: Secondary | ICD-10-CM | POA: Diagnosis not present

## 2014-07-30 DIAGNOSIS — R269 Unspecified abnormalities of gait and mobility: Secondary | ICD-10-CM | POA: Diagnosis not present

## 2014-07-30 DIAGNOSIS — N390 Urinary tract infection, site not specified: Secondary | ICD-10-CM | POA: Diagnosis not present

## 2014-07-31 DIAGNOSIS — B954 Other streptococcus as the cause of diseases classified elsewhere: Secondary | ICD-10-CM | POA: Diagnosis not present

## 2014-07-31 DIAGNOSIS — N390 Urinary tract infection, site not specified: Secondary | ICD-10-CM | POA: Diagnosis not present

## 2014-07-31 DIAGNOSIS — R269 Unspecified abnormalities of gait and mobility: Secondary | ICD-10-CM | POA: Diagnosis not present

## 2014-08-03 DIAGNOSIS — N390 Urinary tract infection, site not specified: Secondary | ICD-10-CM | POA: Diagnosis not present

## 2014-08-03 DIAGNOSIS — R269 Unspecified abnormalities of gait and mobility: Secondary | ICD-10-CM | POA: Diagnosis not present

## 2014-08-03 DIAGNOSIS — B954 Other streptococcus as the cause of diseases classified elsewhere: Secondary | ICD-10-CM | POA: Diagnosis not present

## 2014-08-06 DIAGNOSIS — F17200 Nicotine dependence, unspecified, uncomplicated: Secondary | ICD-10-CM | POA: Diagnosis not present

## 2014-08-06 DIAGNOSIS — J449 Chronic obstructive pulmonary disease, unspecified: Secondary | ICD-10-CM | POA: Diagnosis not present

## 2014-08-06 DIAGNOSIS — M4626 Osteomyelitis of vertebra, lumbar region: Secondary | ICD-10-CM | POA: Diagnosis not present

## 2014-08-06 DIAGNOSIS — N390 Urinary tract infection, site not specified: Secondary | ICD-10-CM | POA: Diagnosis not present

## 2014-08-06 DIAGNOSIS — E039 Hypothyroidism, unspecified: Secondary | ICD-10-CM | POA: Diagnosis not present

## 2014-08-06 DIAGNOSIS — B954 Other streptococcus as the cause of diseases classified elsewhere: Secondary | ICD-10-CM | POA: Diagnosis not present

## 2014-08-06 DIAGNOSIS — R269 Unspecified abnormalities of gait and mobility: Secondary | ICD-10-CM | POA: Diagnosis not present

## 2014-08-06 DIAGNOSIS — I10 Essential (primary) hypertension: Secondary | ICD-10-CM | POA: Diagnosis not present

## 2014-08-12 DIAGNOSIS — B954 Other streptococcus as the cause of diseases classified elsewhere: Secondary | ICD-10-CM | POA: Diagnosis not present

## 2014-08-12 DIAGNOSIS — R269 Unspecified abnormalities of gait and mobility: Secondary | ICD-10-CM | POA: Diagnosis not present

## 2014-08-12 DIAGNOSIS — N390 Urinary tract infection, site not specified: Secondary | ICD-10-CM | POA: Diagnosis not present

## 2014-08-13 DIAGNOSIS — N390 Urinary tract infection, site not specified: Secondary | ICD-10-CM | POA: Diagnosis not present

## 2014-08-13 DIAGNOSIS — R269 Unspecified abnormalities of gait and mobility: Secondary | ICD-10-CM | POA: Diagnosis not present

## 2014-08-13 DIAGNOSIS — B954 Other streptococcus as the cause of diseases classified elsewhere: Secondary | ICD-10-CM | POA: Diagnosis not present

## 2014-08-14 DIAGNOSIS — N390 Urinary tract infection, site not specified: Secondary | ICD-10-CM | POA: Diagnosis not present

## 2014-08-14 DIAGNOSIS — B954 Other streptococcus as the cause of diseases classified elsewhere: Secondary | ICD-10-CM | POA: Diagnosis not present

## 2014-08-14 DIAGNOSIS — R269 Unspecified abnormalities of gait and mobility: Secondary | ICD-10-CM | POA: Diagnosis not present

## 2014-08-17 DIAGNOSIS — B954 Other streptococcus as the cause of diseases classified elsewhere: Secondary | ICD-10-CM | POA: Diagnosis not present

## 2014-08-17 DIAGNOSIS — R269 Unspecified abnormalities of gait and mobility: Secondary | ICD-10-CM | POA: Diagnosis not present

## 2014-08-17 DIAGNOSIS — N390 Urinary tract infection, site not specified: Secondary | ICD-10-CM | POA: Diagnosis not present

## 2014-08-18 DIAGNOSIS — R269 Unspecified abnormalities of gait and mobility: Secondary | ICD-10-CM | POA: Diagnosis not present

## 2014-08-18 DIAGNOSIS — N390 Urinary tract infection, site not specified: Secondary | ICD-10-CM | POA: Diagnosis not present

## 2014-08-18 DIAGNOSIS — B954 Other streptococcus as the cause of diseases classified elsewhere: Secondary | ICD-10-CM | POA: Diagnosis not present

## 2014-08-20 DIAGNOSIS — I10 Essential (primary) hypertension: Secondary | ICD-10-CM | POA: Diagnosis not present

## 2014-08-20 DIAGNOSIS — J449 Chronic obstructive pulmonary disease, unspecified: Secondary | ICD-10-CM | POA: Diagnosis not present

## 2014-08-20 DIAGNOSIS — M4626 Osteomyelitis of vertebra, lumbar region: Secondary | ICD-10-CM | POA: Diagnosis not present

## 2014-08-20 DIAGNOSIS — F17200 Nicotine dependence, unspecified, uncomplicated: Secondary | ICD-10-CM | POA: Diagnosis not present

## 2014-08-24 DIAGNOSIS — N390 Urinary tract infection, site not specified: Secondary | ICD-10-CM | POA: Diagnosis not present

## 2014-08-24 DIAGNOSIS — R269 Unspecified abnormalities of gait and mobility: Secondary | ICD-10-CM | POA: Diagnosis not present

## 2014-08-24 DIAGNOSIS — B954 Other streptococcus as the cause of diseases classified elsewhere: Secondary | ICD-10-CM | POA: Diagnosis not present

## 2014-08-27 DIAGNOSIS — R269 Unspecified abnormalities of gait and mobility: Secondary | ICD-10-CM | POA: Diagnosis not present

## 2014-08-27 DIAGNOSIS — B954 Other streptococcus as the cause of diseases classified elsewhere: Secondary | ICD-10-CM | POA: Diagnosis not present

## 2014-08-27 DIAGNOSIS — N390 Urinary tract infection, site not specified: Secondary | ICD-10-CM | POA: Diagnosis not present

## 2014-09-11 DIAGNOSIS — J449 Chronic obstructive pulmonary disease, unspecified: Secondary | ICD-10-CM | POA: Diagnosis not present

## 2014-09-18 DIAGNOSIS — M4626 Osteomyelitis of vertebra, lumbar region: Secondary | ICD-10-CM | POA: Diagnosis not present

## 2014-09-18 DIAGNOSIS — F17200 Nicotine dependence, unspecified, uncomplicated: Secondary | ICD-10-CM | POA: Diagnosis not present

## 2014-09-18 DIAGNOSIS — I10 Essential (primary) hypertension: Secondary | ICD-10-CM | POA: Diagnosis not present

## 2014-09-18 DIAGNOSIS — J449 Chronic obstructive pulmonary disease, unspecified: Secondary | ICD-10-CM | POA: Diagnosis not present

## 2014-11-05 DIAGNOSIS — J449 Chronic obstructive pulmonary disease, unspecified: Secondary | ICD-10-CM | POA: Diagnosis not present

## 2014-11-05 DIAGNOSIS — Z0001 Encounter for general adult medical examination with abnormal findings: Secondary | ICD-10-CM | POA: Diagnosis not present

## 2014-11-05 DIAGNOSIS — Z23 Encounter for immunization: Secondary | ICD-10-CM | POA: Diagnosis not present

## 2014-11-05 DIAGNOSIS — I10 Essential (primary) hypertension: Secondary | ICD-10-CM | POA: Diagnosis not present

## 2014-11-05 DIAGNOSIS — F17200 Nicotine dependence, unspecified, uncomplicated: Secondary | ICD-10-CM | POA: Diagnosis not present

## 2015-02-04 DIAGNOSIS — E049 Nontoxic goiter, unspecified: Secondary | ICD-10-CM | POA: Diagnosis not present

## 2015-02-04 DIAGNOSIS — Z23 Encounter for immunization: Secondary | ICD-10-CM | POA: Diagnosis not present

## 2015-02-04 DIAGNOSIS — I10 Essential (primary) hypertension: Secondary | ICD-10-CM | POA: Diagnosis not present

## 2015-02-04 DIAGNOSIS — F17200 Nicotine dependence, unspecified, uncomplicated: Secondary | ICD-10-CM | POA: Diagnosis not present

## 2015-02-04 DIAGNOSIS — J449 Chronic obstructive pulmonary disease, unspecified: Secondary | ICD-10-CM | POA: Diagnosis not present

## 2015-04-30 DIAGNOSIS — J449 Chronic obstructive pulmonary disease, unspecified: Secondary | ICD-10-CM | POA: Diagnosis not present

## 2015-05-11 DIAGNOSIS — F17200 Nicotine dependence, unspecified, uncomplicated: Secondary | ICD-10-CM | POA: Diagnosis not present

## 2015-05-11 DIAGNOSIS — J449 Chronic obstructive pulmonary disease, unspecified: Secondary | ICD-10-CM | POA: Diagnosis not present

## 2015-05-11 DIAGNOSIS — E049 Nontoxic goiter, unspecified: Secondary | ICD-10-CM | POA: Diagnosis not present

## 2015-05-11 DIAGNOSIS — I10 Essential (primary) hypertension: Secondary | ICD-10-CM | POA: Diagnosis not present

## 2016-01-06 DIAGNOSIS — I10 Essential (primary) hypertension: Secondary | ICD-10-CM | POA: Diagnosis not present

## 2016-01-06 DIAGNOSIS — F17200 Nicotine dependence, unspecified, uncomplicated: Secondary | ICD-10-CM | POA: Diagnosis not present

## 2016-01-06 DIAGNOSIS — J449 Chronic obstructive pulmonary disease, unspecified: Secondary | ICD-10-CM | POA: Diagnosis not present

## 2016-01-06 DIAGNOSIS — E042 Nontoxic multinodular goiter: Secondary | ICD-10-CM | POA: Diagnosis not present

## 2016-01-06 DIAGNOSIS — Z0000 Encounter for general adult medical examination without abnormal findings: Secondary | ICD-10-CM | POA: Diagnosis not present

## 2016-01-06 DIAGNOSIS — R7309 Other abnormal glucose: Secondary | ICD-10-CM | POA: Diagnosis not present

## 2016-01-06 DIAGNOSIS — E039 Hypothyroidism, unspecified: Secondary | ICD-10-CM | POA: Diagnosis not present

## 2016-01-07 ENCOUNTER — Other Ambulatory Visit (HOSPITAL_COMMUNITY): Payer: Self-pay | Admitting: Internal Medicine

## 2016-01-07 DIAGNOSIS — E049 Nontoxic goiter, unspecified: Secondary | ICD-10-CM

## 2016-01-13 ENCOUNTER — Ambulatory Visit (HOSPITAL_COMMUNITY)
Admission: RE | Admit: 2016-01-13 | Discharge: 2016-01-13 | Disposition: A | Discharge status: Home / Self Care | Payer: Medicare Other | Source: Ambulatory Visit | Attending: Internal Medicine | Admitting: Internal Medicine

## 2016-01-13 DIAGNOSIS — E042 Nontoxic multinodular goiter: Secondary | ICD-10-CM | POA: Diagnosis not present

## 2016-01-13 DIAGNOSIS — E049 Nontoxic goiter, unspecified: Secondary | ICD-10-CM

## 2016-02-23 ENCOUNTER — Ambulatory Visit: Payer: Medicare Other | Admitting: Endocrinology

## 2016-03-08 ENCOUNTER — Encounter: Payer: Self-pay | Admitting: Endocrinology

## 2016-03-08 ENCOUNTER — Ambulatory Visit (INDEPENDENT_AMBULATORY_CARE_PROVIDER_SITE_OTHER): Payer: Medicare Other | Admitting: Endocrinology

## 2016-03-08 VITALS — BP 168/84 | HR 70 | Temp 97.8°F | Resp 14 | Ht 67.0 in | Wt 109.0 lb

## 2016-03-08 DIAGNOSIS — E042 Nontoxic multinodular goiter: Secondary | ICD-10-CM | POA: Diagnosis not present

## 2016-03-08 DIAGNOSIS — I10 Essential (primary) hypertension: Secondary | ICD-10-CM | POA: Diagnosis not present

## 2016-03-08 NOTE — Progress Notes (Signed)
Patient ID: Lisa [name], female   DOB: 1935/06/09, 81 y.o.   MRN: [id number]            Reason for Appointment: Goiter, new consultation    History of Present Illness:   The patient's thyroid enlargement was first discovered when she was about 81 years old  She thinks her goiter has grown gradually She has been treated with Synthroid initially and this was stopped about in years ago, she does not think it was helping slowing the growth of the plan She also has had biopsies on her thyroid about 3 times and these were all benign.  She has had difficulty with swallowing  Does not feel like she has any choking sensation in her neck or pressure in any position or when lying down.  Her TSH done by PCP in 7/17 was normal at 1.4  Lab Results  Component Value Date   TSH 1.140 04/09/2014    She has had an ultrasound exam in 8/17 Ordered by PCP  This showed 3.7 cm mostly solid nodule in the right lobe, 4.7 cm complex nodule in the left lobe and 4.1 cm complex solid nodule with coarse calcifications in the isthmus.  No comparison was made to previous ultrasound exams     Medication List       Accurate as of 03/08/16  2:27 PM. Always use your most recent med list.          acetaminophen 500 MG tablet Commonly known as:  TYLENOL Take 500 mg by mouth every 6 (six) hours as needed for headache.   CENTRUM ADULTS PO Take 1 tablet by mouth daily.   cloNIDine 0.2 MG tablet Commonly known as:  CATAPRES Take 0.2 mg by mouth 2 (two) times daily.   diltiazem 120 MG 24 hr capsule Commonly known as:  TIAZAC   feeding supplement (ENSURE COMPLETE) Liqd Take 237 mLs by mouth 2 (two) times daily between meals.   furosemide 40 MG tablet Commonly known as:  LASIX Take 40 mg by mouth.   COMBIVENT RESPIMAT 20-100 MCG/ACT Aers respimat Generic drug:  Ipratropium-Albuterol Inhale 1 puff into the lungs every 6 (six) hours.   ipratropium-albuterol 0.5-2.5 (3) MG/3ML Soln Commonly known  as:  DUONEB Take 3 mLs by nebulization every 6 (six) hours as needed. For shortness of breath   labetalol 300 MG tablet Commonly known as:  NORMODYNE Take 300 mg by mouth 2 (two) times daily.   LORazepam 0.5 MG tablet Commonly known as:  ATIVAN Take 1 tablet (0.5 mg total) by mouth every 8 (eight) hours as needed for anxiety.   losartan-hydrochlorothiazide 100-25 MG tablet Commonly known as:  HYZAAR Take 1 tablet by mouth daily.   mirtazapine 15 MG tablet Commonly known as:  REMERON Take 15 mg by mouth at bedtime.   oyster calcium 500 MG Tabs tablet Take 500 mg of elemental calcium by mouth daily.   potassium chloride SA 20 MEQ tablet Commonly known as:  K-DUR,KLOR-CON Take 2 tablets (40 mEq total) by mouth once.   traMADol 50 MG tablet Commonly known as:  ULTRAM Take 1 tablet (50 mg total) by mouth every 6 (six) hours as needed for moderate pain.   VITAMIN-B COMPLEX PO Take 1 tablet by mouth daily.       Allergies: No Known Allergies  Past Medical History:  Diagnosis Date  . COPD (chronic obstructive pulmonary disease) ([place])   . Hypertension   . Pyelonephritis 04/06/2014   UTI  Past Surgical History:  Procedure Laterality Date  . ABDOMINAL HYSTERECTOMY    . TEE WITHOUT CARDIOVERSION N/A 04/10/2014   Procedure: TRANSESOPHAGEAL ECHOCARDIOGRAM (TEE);  Surgeon: [name], MD;  Location: [facility] ENDOSCOPY;  Service: Cardiovascular;  Laterality: N/A;    Family History  Problem Relation Age of Onset  . Hypertension Mother   . Hypertension Father     Social History:  reports that she quit smoking about 21 months ago. Her smoking use included Cigarettes. She has never used smokeless tobacco. She reports that she does not drink alcohol or use drugs.    Review of Systems  Constitutional: Negative for weight loss and reduced appetite.  HENT: Negative for hoarseness and trouble swallowing.   Respiratory: Positive for shortness of breath.   Gastrointestinal:  Negative for diarrhea.  Endocrine: Positive for fatigue. Negative for heat intolerance.       On walking  Musculoskeletal: Negative for joint pain.   HYPERTENSION: She has not taken her medication today  BP Readings from Last 3 Encounters:  03/08/16 (!) 168/84  06/02/14 (!) 222/77  05/12/14 (!) 158/62     Examination:   BP (!) 168/84 Comment: has not taken medication yet.  Pulse 70   Temp 97.8 F (36.6 C)   Resp 14   Ht '5\' 7"'$  (1.702 m)   Wt 109 lb (49.4 kg)   SpO2 97%   BMI 17.07 kg/m    General Appearance: pleasant, Averagely built and nourished         Eyes: No abnormal prominence or eyelid swelling.          Neck: The thyroid is enlarged .4-5 times bilaterally and smooth.  Left lateral part of the thyroid is slightly firmer and also the nodule in the isthmus is firmer. Trachea does not appear to be deviated Neck circumference is 38.8 cm over the thyroid There is no stridor. Pemberton sign is negative There is no lymphadenopathy.     Cardiovascular: Normal  heart sounds, no murmur Respiratory:  Lungs clear Abdomen shows no hepatosplenomegaly or other mass Neurological: REFLEXES: at biceps are normal.  Skin: no rash        Assessment/Plan:  Multinodular goiter, present for about 40 years This is significantly large but appears to be external and she does not subjectively had many signs or symptoms of local pressure with any symptoms of choking or difficulty swallowing Patient thinks that the goiter has grown only slowly She is not concerned about it and is not wanting surgery Thyroid functions are quite normal  Since she already has had needle biopsies 3 times which have been benign and there does not appear to be any unusual heart areas on exam as well as no suspicious lesions on ultrasound do not think she needs another biopsy. She does need to have an ultrasound done annually to monitor the progression of thyroid enlargement She will not benefit from thyroid  suppression either  She will come back for 6 months for a clinical evaluation again   [facility] 03/08/2016

## 2016-05-02 DIAGNOSIS — I10 Essential (primary) hypertension: Secondary | ICD-10-CM | POA: Diagnosis not present

## 2016-05-02 DIAGNOSIS — Z23 Encounter for immunization: Secondary | ICD-10-CM | POA: Diagnosis not present

## 2016-05-02 DIAGNOSIS — E049 Nontoxic goiter, unspecified: Secondary | ICD-10-CM | POA: Diagnosis not present

## 2016-05-02 DIAGNOSIS — J441 Chronic obstructive pulmonary disease with (acute) exacerbation: Secondary | ICD-10-CM | POA: Diagnosis not present

## 2016-08-31 ENCOUNTER — Other Ambulatory Visit (HOSPITAL_COMMUNITY): Payer: Self-pay | Admitting: Internal Medicine

## 2016-08-31 DIAGNOSIS — Z780 Asymptomatic menopausal state: Secondary | ICD-10-CM

## 2016-08-31 DIAGNOSIS — E039 Hypothyroidism, unspecified: Secondary | ICD-10-CM | POA: Diagnosis not present

## 2016-08-31 DIAGNOSIS — R627 Adult failure to thrive: Secondary | ICD-10-CM | POA: Diagnosis not present

## 2016-08-31 DIAGNOSIS — I10 Essential (primary) hypertension: Secondary | ICD-10-CM | POA: Diagnosis not present

## 2016-08-31 DIAGNOSIS — R2681 Unsteadiness on feet: Secondary | ICD-10-CM | POA: Diagnosis not present

## 2016-08-31 DIAGNOSIS — J449 Chronic obstructive pulmonary disease, unspecified: Secondary | ICD-10-CM | POA: Diagnosis not present

## 2016-08-31 DIAGNOSIS — Z0000 Encounter for general adult medical examination without abnormal findings: Secondary | ICD-10-CM | POA: Diagnosis not present

## 2016-09-01 DIAGNOSIS — J449 Chronic obstructive pulmonary disease, unspecified: Secondary | ICD-10-CM | POA: Diagnosis not present

## 2016-09-05 ENCOUNTER — Telehealth: Payer: Self-pay | Admitting: Endocrinology

## 2016-09-05 ENCOUNTER — Ambulatory Visit: Payer: Medicare Other | Admitting: Endocrinology

## 2016-09-05 NOTE — Telephone Encounter (Signed)
Patient no showed today's appt. Please advise on how to follow up. °A. No follow up necessary. °B. Follow up urgent. Contact patient immediately. °C. Follow up necessary. Contact patient and schedule visit in ___ days. °D. Follow up advised. Contact patient and schedule visit in ____weeks. ° °

## 2016-09-05 NOTE — Telephone Encounter (Signed)
.   Follow up advised. Contact patient and schedule visit in _2___weeks

## 2016-09-06 ENCOUNTER — Other Ambulatory Visit (HOSPITAL_COMMUNITY): Payer: Medicare Other

## 2016-09-14 ENCOUNTER — Encounter (HOSPITAL_COMMUNITY): Payer: Self-pay

## 2016-09-14 ENCOUNTER — Other Ambulatory Visit (HOSPITAL_COMMUNITY): Payer: Medicare Other

## 2016-10-29 IMAGING — CR DG CHEST 1V PORT
1 series · 1 of 1 positions shown · non-contrast
Comparison: 04/06/2014

CLINICAL DATA: Respiratory distress.

EXAM:
PORTABLE CHEST - 1 VIEW

[portable]
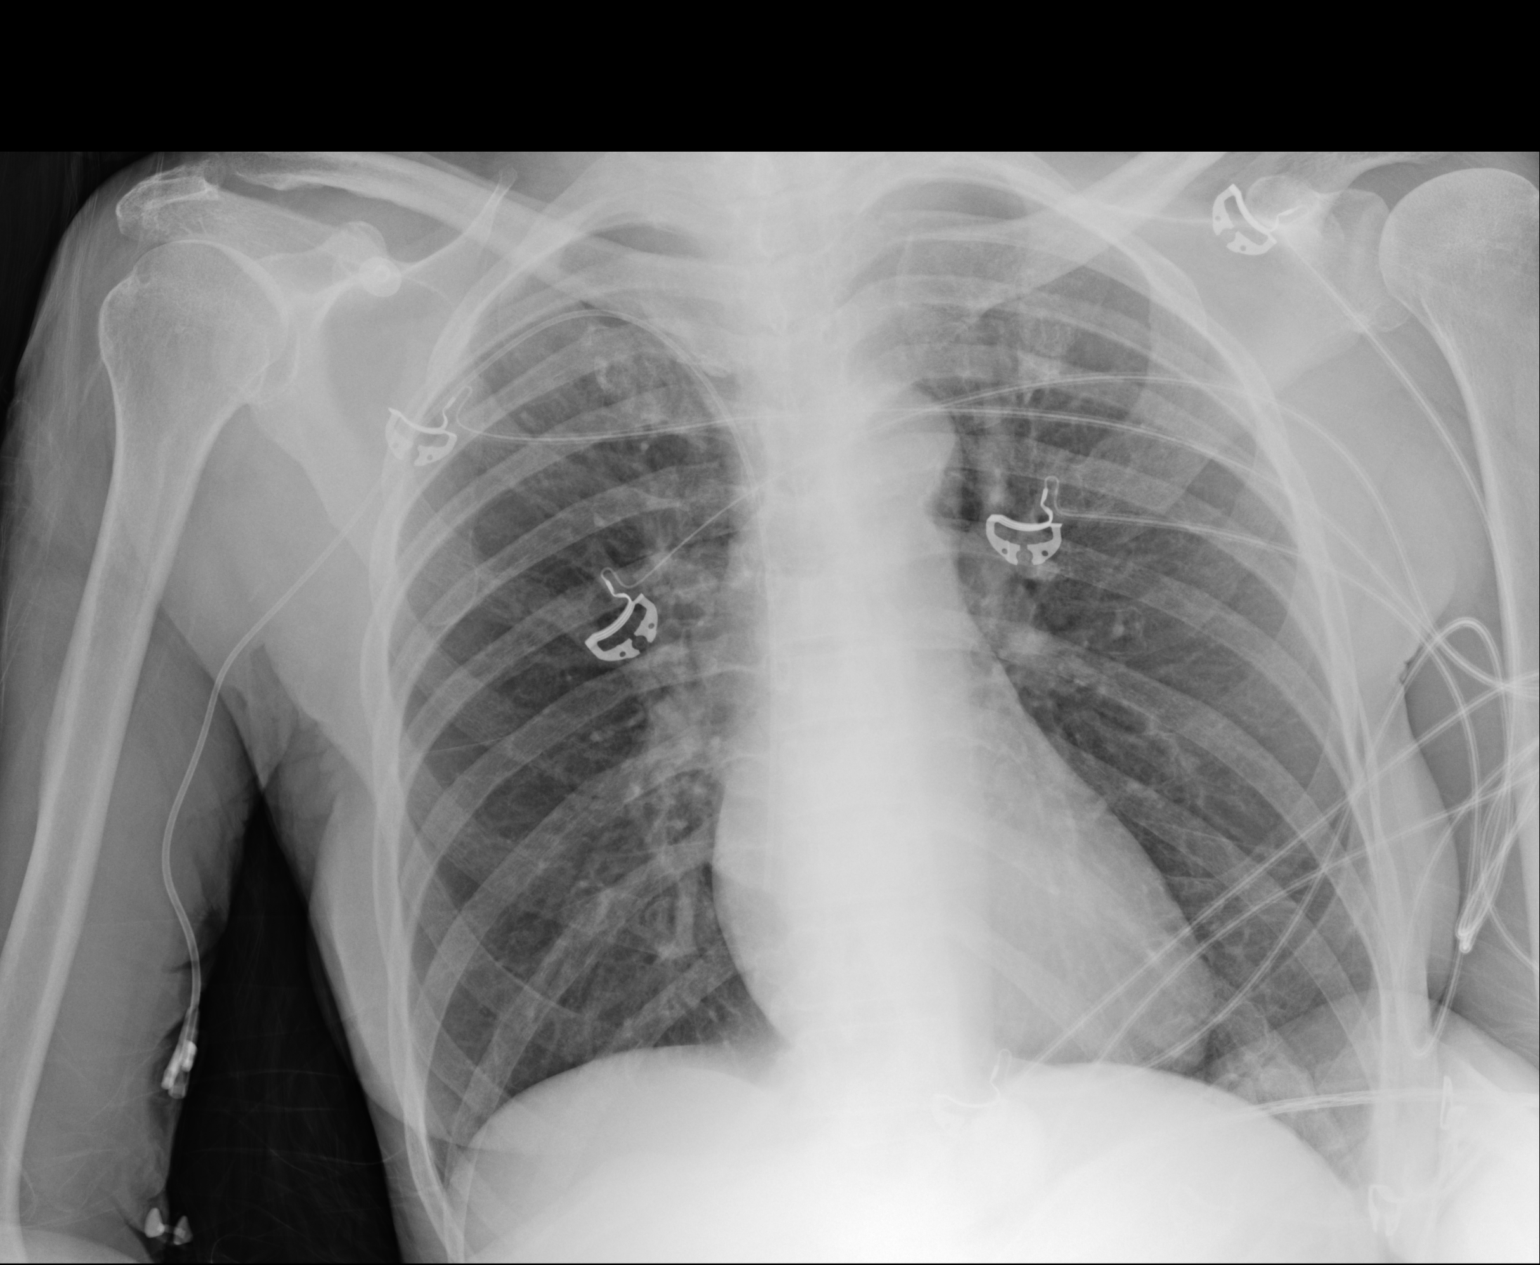

[1 of 1 positions shown; findings below may reference images not displayed]

FINDINGS: Interval placement of a right PICC catheter. Tip localizes about
vertebral bodies below the carina suggesting location in the
cavoatrial junction. No pneumothorax. Normal heart size and
pulmonary vascularity. No focal airspace disease or consolidation in
the lungs. No blunting of costophrenic angles. No pneumothorax.
Mediastinal contours appear intact.
IMPRESSION: PICC line tip appears to be localized at the cavoatrial junction. No
evidence of active pulmonary disease.

## 2016-11-30 DIAGNOSIS — I10 Essential (primary) hypertension: Secondary | ICD-10-CM | POA: Diagnosis not present

## 2016-11-30 DIAGNOSIS — E049 Nontoxic goiter, unspecified: Secondary | ICD-10-CM | POA: Diagnosis not present

## 2016-11-30 DIAGNOSIS — R2681 Unsteadiness on feet: Secondary | ICD-10-CM | POA: Diagnosis not present

## 2016-11-30 DIAGNOSIS — J449 Chronic obstructive pulmonary disease, unspecified: Secondary | ICD-10-CM | POA: Diagnosis not present

## 2016-12-04 ENCOUNTER — Ambulatory Visit (HOSPITAL_COMMUNITY)
Admission: RE | Admit: 2016-12-04 | Discharge: 2016-12-04 | Disposition: A | Discharge status: Home / Self Care | Payer: Medicare Other | Source: Ambulatory Visit | Attending: Internal Medicine | Admitting: Internal Medicine

## 2016-12-04 DIAGNOSIS — Z780 Asymptomatic menopausal state: Secondary | ICD-10-CM | POA: Diagnosis not present

## 2017-03-30 DIAGNOSIS — Z23 Encounter for immunization: Secondary | ICD-10-CM | POA: Diagnosis not present

## 2017-04-11 DIAGNOSIS — E049 Nontoxic goiter, unspecified: Secondary | ICD-10-CM | POA: Diagnosis not present

## 2017-04-11 DIAGNOSIS — J449 Chronic obstructive pulmonary disease, unspecified: Secondary | ICD-10-CM | POA: Diagnosis not present

## 2017-04-11 DIAGNOSIS — R2681 Unsteadiness on feet: Secondary | ICD-10-CM | POA: Diagnosis not present

## 2017-04-11 DIAGNOSIS — I10 Essential (primary) hypertension: Secondary | ICD-10-CM | POA: Diagnosis not present

## 2017-10-18 DIAGNOSIS — E039 Hypothyroidism, unspecified: Secondary | ICD-10-CM | POA: Diagnosis not present

## 2017-10-18 DIAGNOSIS — Z1331 Encounter for screening for depression: Secondary | ICD-10-CM | POA: Diagnosis not present

## 2017-10-18 DIAGNOSIS — Z1389 Encounter for screening for other disorder: Secondary | ICD-10-CM | POA: Diagnosis not present

## 2017-10-18 DIAGNOSIS — J449 Chronic obstructive pulmonary disease, unspecified: Secondary | ICD-10-CM | POA: Diagnosis not present

## 2017-10-18 DIAGNOSIS — I10 Essential (primary) hypertension: Secondary | ICD-10-CM | POA: Diagnosis not present

## 2017-10-18 DIAGNOSIS — R627 Adult failure to thrive: Secondary | ICD-10-CM | POA: Diagnosis not present

## 2017-10-18 DIAGNOSIS — Z0001 Encounter for general adult medical examination with abnormal findings: Secondary | ICD-10-CM | POA: Diagnosis not present

## 2017-10-18 DIAGNOSIS — R739 Hyperglycemia, unspecified: Secondary | ICD-10-CM | POA: Diagnosis not present

## 2017-10-18 DIAGNOSIS — R2681 Unsteadiness on feet: Secondary | ICD-10-CM | POA: Diagnosis not present

## 2017-10-18 DIAGNOSIS — Z0000 Encounter for general adult medical examination without abnormal findings: Secondary | ICD-10-CM | POA: Diagnosis not present

## 2017-10-29 DIAGNOSIS — J441 Chronic obstructive pulmonary disease with (acute) exacerbation: Secondary | ICD-10-CM | POA: Diagnosis not present

## 2018-01-16 DIAGNOSIS — R2681 Unsteadiness on feet: Secondary | ICD-10-CM | POA: Diagnosis not present

## 2018-01-16 DIAGNOSIS — J449 Chronic obstructive pulmonary disease, unspecified: Secondary | ICD-10-CM | POA: Diagnosis not present

## 2018-01-16 DIAGNOSIS — E46 Unspecified protein-calorie malnutrition: Secondary | ICD-10-CM | POA: Diagnosis not present

## 2018-04-19 DIAGNOSIS — J449 Chronic obstructive pulmonary disease, unspecified: Secondary | ICD-10-CM | POA: Diagnosis not present

## 2018-04-19 DIAGNOSIS — Z23 Encounter for immunization: Secondary | ICD-10-CM | POA: Diagnosis not present

## 2018-04-19 DIAGNOSIS — E049 Nontoxic goiter, unspecified: Secondary | ICD-10-CM | POA: Diagnosis not present

## 2018-04-19 DIAGNOSIS — I739 Peripheral vascular disease, unspecified: Secondary | ICD-10-CM | POA: Diagnosis not present

## 2018-04-19 DIAGNOSIS — I10 Essential (primary) hypertension: Secondary | ICD-10-CM | POA: Diagnosis not present

## 2018-07-19 DIAGNOSIS — J449 Chronic obstructive pulmonary disease, unspecified: Secondary | ICD-10-CM | POA: Diagnosis not present

## 2018-07-19 DIAGNOSIS — E46 Unspecified protein-calorie malnutrition: Secondary | ICD-10-CM | POA: Diagnosis not present

## 2018-07-19 DIAGNOSIS — R2681 Unsteadiness on feet: Secondary | ICD-10-CM | POA: Diagnosis not present

## 2018-09-13 ENCOUNTER — Other Ambulatory Visit: Payer: Self-pay

## 2018-09-13 ENCOUNTER — Emergency Department (HOSPITAL_COMMUNITY): Payer: Medicare Other

## 2018-09-13 ENCOUNTER — Inpatient Hospital Stay (HOSPITAL_COMMUNITY)
Admission: EM | Admit: 2018-09-13 | Discharge: 2018-09-17 | DRG: 418 | Disposition: A | Discharge status: Home / Self Care | Payer: Medicare Other | Attending: Internal Medicine | Admitting: Internal Medicine

## 2018-09-13 ENCOUNTER — Encounter (HOSPITAL_COMMUNITY): Payer: Self-pay | Admitting: Emergency Medicine

## 2018-09-13 DIAGNOSIS — Z90710 Acquired absence of both cervix and uterus: Secondary | ICD-10-CM | POA: Diagnosis not present

## 2018-09-13 DIAGNOSIS — I129 Hypertensive chronic kidney disease with stage 1 through stage 4 chronic kidney disease, or unspecified chronic kidney disease: Secondary | ICD-10-CM | POA: Diagnosis not present

## 2018-09-13 DIAGNOSIS — N184 Chronic kidney disease, stage 4 (severe): Secondary | ICD-10-CM | POA: Diagnosis present

## 2018-09-13 DIAGNOSIS — E739 Lactose intolerance, unspecified: Secondary | ICD-10-CM | POA: Diagnosis not present

## 2018-09-13 DIAGNOSIS — I10 Essential (primary) hypertension: Secondary | ICD-10-CM | POA: Diagnosis not present

## 2018-09-13 DIAGNOSIS — I6522 Occlusion and stenosis of left carotid artery: Secondary | ICD-10-CM | POA: Diagnosis present

## 2018-09-13 DIAGNOSIS — E876 Hypokalemia: Secondary | ICD-10-CM | POA: Diagnosis present

## 2018-09-13 DIAGNOSIS — E46 Unspecified protein-calorie malnutrition: Secondary | ICD-10-CM | POA: Diagnosis not present

## 2018-09-13 DIAGNOSIS — Z79899 Other long term (current) drug therapy: Secondary | ICD-10-CM | POA: Diagnosis not present

## 2018-09-13 DIAGNOSIS — K8020 Calculus of gallbladder without cholecystitis without obstruction: Secondary | ICD-10-CM

## 2018-09-13 DIAGNOSIS — R911 Solitary pulmonary nodule: Secondary | ICD-10-CM | POA: Diagnosis not present

## 2018-09-13 DIAGNOSIS — Z87891 Personal history of nicotine dependence: Secondary | ICD-10-CM | POA: Diagnosis not present

## 2018-09-13 DIAGNOSIS — Z7951 Long term (current) use of inhaled steroids: Secondary | ICD-10-CM | POA: Diagnosis not present

## 2018-09-13 DIAGNOSIS — R221 Localized swelling, mass and lump, neck: Secondary | ICD-10-CM | POA: Diagnosis not present

## 2018-09-13 DIAGNOSIS — R945 Abnormal results of liver function studies: Secondary | ICD-10-CM

## 2018-09-13 DIAGNOSIS — K810 Acute cholecystitis: Secondary | ICD-10-CM | POA: Diagnosis not present

## 2018-09-13 DIAGNOSIS — K8010 Calculus of gallbladder with chronic cholecystitis without obstruction: Secondary | ICD-10-CM | POA: Diagnosis not present

## 2018-09-13 DIAGNOSIS — R7989 Other specified abnormal findings of blood chemistry: Secondary | ICD-10-CM

## 2018-09-13 DIAGNOSIS — F419 Anxiety disorder, unspecified: Secondary | ICD-10-CM | POA: Diagnosis present

## 2018-09-13 DIAGNOSIS — K851 Biliary acute pancreatitis without necrosis or infection: Secondary | ICD-10-CM | POA: Diagnosis not present

## 2018-09-13 DIAGNOSIS — Z01818 Encounter for other preprocedural examination: Secondary | ICD-10-CM

## 2018-09-13 DIAGNOSIS — K838 Other specified diseases of biliary tract: Secondary | ICD-10-CM | POA: Diagnosis not present

## 2018-09-13 DIAGNOSIS — E049 Nontoxic goiter, unspecified: Secondary | ICD-10-CM | POA: Diagnosis present

## 2018-09-13 DIAGNOSIS — I6529 Occlusion and stenosis of unspecified carotid artery: Secondary | ICD-10-CM

## 2018-09-13 DIAGNOSIS — J449 Chronic obstructive pulmonary disease, unspecified: Secondary | ICD-10-CM | POA: Diagnosis not present

## 2018-09-13 DIAGNOSIS — K828 Other specified diseases of gallbladder: Secondary | ICD-10-CM | POA: Diagnosis not present

## 2018-09-13 DIAGNOSIS — Z681 Body mass index (BMI) 19 or less, adult: Secondary | ICD-10-CM

## 2018-09-13 DIAGNOSIS — K8000 Calculus of gallbladder with acute cholecystitis without obstruction: Secondary | ICD-10-CM | POA: Diagnosis present

## 2018-09-13 DIAGNOSIS — R1011 Right upper quadrant pain: Secondary | ICD-10-CM | POA: Diagnosis present

## 2018-09-13 DIAGNOSIS — I2510 Atherosclerotic heart disease of native coronary artery without angina pectoris: Secondary | ICD-10-CM | POA: Diagnosis not present

## 2018-09-13 LAB — COMPREHENSIVE METABOLIC PANEL
ALT: 75 U/L — ABNORMAL HIGH (ref 0–44)
AST: 80 U/L — ABNORMAL HIGH (ref 15–41)
Albumin: 3.3 g/dL — ABNORMAL LOW (ref 3.5–5.0)
Alkaline Phosphatase: 158 U/L — ABNORMAL HIGH (ref 38–126)
Anion gap: 10 (ref 5–15)
BUN: 29 mg/dL — ABNORMAL HIGH (ref 8–23)
CO2: 25 mmol/L (ref 22–32)
Calcium: 9.1 mg/dL (ref 8.9–10.3)
Chloride: 104 mmol/L (ref 98–111)
Creatinine, Ser: 1.55 mg/dL — ABNORMAL HIGH (ref 0.44–1.00)
GFR calc Af Amer: 36 mL/min — ABNORMAL LOW (ref >60)
GFR calc non Af Amer: 31 mL/min — ABNORMAL LOW (ref >60)
Glucose, Bld: 129 mg/dL — ABNORMAL HIGH (ref 70–99)
Potassium: 3.4 mmol/L — ABNORMAL LOW (ref 3.5–5.1)
Sodium: 139 mmol/L (ref 135–145)
Total Bilirubin: 2.1 mg/dL — ABNORMAL HIGH (ref 0.3–1.2)
Total Protein: 7.7 g/dL (ref 6.5–8.1)

## 2018-09-13 LAB — URINALYSIS, ROUTINE W REFLEX MICROSCOPIC
Bacteria, UA: NONE SEEN
Glucose, UA: NEGATIVE mg/dL
Hgb urine dipstick: NEGATIVE
Ketones, ur: NEGATIVE mg/dL
Leukocytes,Ua: NEGATIVE
Nitrite: NEGATIVE
Protein, ur: 100 mg/dL — AB
Specific Gravity, Urine: 1.025 (ref 1.005–1.030)
pH: 5 (ref 5.0–8.0)

## 2018-09-13 LAB — CBC
HCT: 36.7 % (ref 36.0–46.0)
Hemoglobin: 11.9 g/dL — ABNORMAL LOW (ref 12.0–15.0)
MCH: 29.8 pg (ref 26.0–34.0)
MCHC: 32.4 g/dL (ref 30.0–36.0)
MCV: 92 fL (ref 80.0–100.0)
Platelets: 144 10*3/uL — ABNORMAL LOW (ref 150–400)
RBC: 3.99 MIL/uL (ref 3.87–5.11)
RDW: 12.8 % (ref 11.5–15.5)
WBC: 3.7 10*3/uL — ABNORMAL LOW (ref 4.0–10.5)
nRBC: 0 % (ref 0.0–0.2)

## 2018-09-13 LAB — LIPASE, BLOOD: Lipase: 715 U/L — ABNORMAL HIGH (ref 11–51)

## 2018-09-13 MED ORDER — SODIUM CHLORIDE 0.9 % IV SOLN
1.0000 g | Freq: Once | INTRAVENOUS | Status: AC
  Administered 2018-09-13: 16:00:00 1 g via INTRAVENOUS
  Filled 2018-09-13: qty 10

## 2018-09-13 MED ORDER — POTASSIUM CHLORIDE CRYS ER 20 MEQ PO TBCR
40.0000 meq | EXTENDED_RELEASE_TABLET | Freq: Once | ORAL | Status: AC
  Administered 2018-09-13: 18:00:00 40 meq via ORAL
  Filled 2018-09-13: qty 2

## 2018-09-13 MED ORDER — HYDROMORPHONE HCL 1 MG/ML IJ SOLN
0.5000 mg | Freq: Once | INTRAMUSCULAR | Status: DC
  Filled 2018-09-13: qty 1

## 2018-09-13 MED ORDER — LOSARTAN POTASSIUM 50 MG PO TABS
100.0000 mg | ORAL_TABLET | Freq: Every day | ORAL | Status: DC
  Administered 2018-09-14 – 2018-09-16 (×3): 100 mg via ORAL
  Filled 2018-09-13 (×4): qty 2

## 2018-09-13 MED ORDER — FAMOTIDINE IN NACL 20-0.9 MG/50ML-% IV SOLN
20.0000 mg | Freq: Once | INTRAVENOUS | Status: AC
  Administered 2018-09-13: 15:00:00 20 mg via INTRAVENOUS
  Filled 2018-09-13: qty 50

## 2018-09-13 MED ORDER — SODIUM CHLORIDE 0.9 % IV SOLN: 2.0000 g | INTRAVENOUS | Status: DC

## 2018-09-13 MED ORDER — OXYCODONE HCL 5 MG PO TABS: 5.0000 mg | ORAL_TABLET | Freq: Four times a day (QID) | ORAL | Status: DC | PRN

## 2018-09-13 MED ORDER — SODIUM CHLORIDE 0.9 % IV SOLN
INTRAVENOUS | Status: DC
  Administered 2018-09-13 – 2018-09-17 (×5): via INTRAVENOUS

## 2018-09-13 MED ORDER — ALUM & MAG HYDROXIDE-SIMETH 200-200-20 MG/5ML PO SUSP
30.0000 mL | Freq: Once | ORAL | Status: AC
  Administered 2018-09-13: 30 mL via ORAL
  Filled 2018-09-13: qty 30

## 2018-09-13 MED ORDER — IPRATROPIUM-ALBUTEROL 0.5-2.5 (3) MG/3ML IN SOLN
3.0000 mL | Freq: Four times a day (QID) | RESPIRATORY_TRACT | Status: DC
  Administered 2018-09-13 – 2018-09-16 (×10): 3 mL via RESPIRATORY_TRACT
  Filled 2018-09-13 (×10): qty 3

## 2018-09-13 MED ORDER — DILTIAZEM HCL ER BEADS 120 MG PO CP24
120.0000 mg | ORAL_CAPSULE | Freq: Every day | ORAL | Status: DC
  Administered 2018-09-14 – 2018-09-16 (×3): 120 mg via ORAL
  Filled 2018-09-13 (×9): qty 1

## 2018-09-13 MED ORDER — SODIUM CHLORIDE 0.9 % IV SOLN: 1.0000 g | INTRAVENOUS | Status: DC

## 2018-09-13 MED ORDER — SODIUM CHLORIDE 0.9% FLUSH
3.0000 mL | Freq: Once | INTRAVENOUS | Status: AC
  Administered 2018-09-13: 3 mL via INTRAVENOUS

## 2018-09-13 MED ORDER — HYDRALAZINE HCL 20 MG/ML IJ SOLN
5.0000 mg | Freq: Four times a day (QID) | INTRAMUSCULAR | Status: DC | PRN
  Administered 2018-09-14: 5 mg via INTRAVENOUS
  Filled 2018-09-13: qty 1

## 2018-09-13 MED ORDER — LORAZEPAM 0.5 MG PO TABS: 0.5000 mg | ORAL_TABLET | Freq: Three times a day (TID) | ORAL | Status: DC | PRN

## 2018-09-13 MED ORDER — ALBUTEROL SULFATE (2.5 MG/3ML) 0.083% IN NEBU: 2.5000 mg | INHALATION_SOLUTION | RESPIRATORY_TRACT | Status: DC | PRN

## 2018-09-13 MED ORDER — IPRATROPIUM-ALBUTEROL 20-100 MCG/ACT IN AERS
1.0000 | INHALATION_SPRAY | Freq: Four times a day (QID) | RESPIRATORY_TRACT | Status: DC
  Filled 2018-09-13: qty 4

## 2018-09-13 MED ORDER — METRONIDAZOLE IN NACL 5-0.79 MG/ML-% IV SOLN
500.0000 mg | Freq: Three times a day (TID) | INTRAVENOUS | Status: DC
  Administered 2018-09-13 – 2018-09-14 (×3): 500 mg via INTRAVENOUS
  Filled 2018-09-13 (×3): qty 100

## 2018-09-13 MED ORDER — LIDOCAINE VISCOUS HCL 2 % MT SOLN
15.0000 mL | Freq: Once | OROMUCOSAL | Status: AC
  Administered 2018-09-13: 15:00:00 15 mL via ORAL
  Filled 2018-09-13: qty 15

## 2018-09-13 MED ORDER — HEPARIN SODIUM (PORCINE) 5000 UNIT/ML IJ SOLN
5000.0000 [IU] | Freq: Three times a day (TID) | INTRAMUSCULAR | Status: DC
  Administered 2018-09-13 – 2018-09-17 (×10): 5000 [IU] via SUBCUTANEOUS
  Filled 2018-09-13 (×10): qty 1

## 2018-09-13 MED ORDER — LABETALOL HCL 200 MG PO TABS
300.0000 mg | ORAL_TABLET | Freq: Two times a day (BID) | ORAL | Status: DC
  Administered 2018-09-13 – 2018-09-16 (×7): 300 mg via ORAL
  Filled 2018-09-13 (×8): qty 2

## 2018-09-13 MED ORDER — CLONIDINE HCL 0.2 MG PO TABS
0.2000 mg | ORAL_TABLET | Freq: Two times a day (BID) | ORAL | Status: DC
  Administered 2018-09-13 – 2018-09-16 (×7): 0.2 mg via ORAL
  Filled 2018-09-13 (×8): qty 1

## 2018-09-13 NOTE — ED Notes (Signed)
ED TO INPATIENT HANDOFF REPORT  ED Nurse Name and Phone #: Shirlean Mylar   [phone]   Name/Age/Gender Lisa [name] 84 y.o. female Room/Bed: APA03/APA03  Code Status   Code Status: Prior  Home/SNF/Other Home Patient oriented to: self Is this baseline? Yes   Triage Complete: Triage complete  Chief Complaint abd pain  Triage Note Patient states she is allergic to milk but had a milkshake on Monday. Has has abdominal pain since. Lasts about 3 hours after eating per patient.   Allergies No Known Allergies  Level of Care/Admitting Diagnosis ED Disposition    ED Disposition Condition [place] [facility] Area: [facility] [[id number]]  Level of Care: Med-Surg [16]  Diagnosis: Acute cholecystitis [575.0.ICD-9-CM]  Admitting Physician: Manfred Shirts  Attending Physician: Waldron Labs, DAWOOD S [[id number]]  Estimated length of stay: 3 - 4 days  Certification:: I certify this patient will need inpatient services for at least 2 midnights  PT Class (Do Not Modify): Inpatient [101]  PT Acc Code (Do Not Modify): Private [1]       B Medical/Surgery History Past Medical History:  Diagnosis Date  . COPD (chronic obstructive pulmonary disease) ([place])   . Hypertension   . Pyelonephritis 04/06/2014   UTI   Past Surgical History:  Procedure Laterality Date  . ABDOMINAL HYSTERECTOMY    . TEE WITHOUT CARDIOVERSION N/A 04/10/2014   Procedure: TRANSESOPHAGEAL ECHOCARDIOGRAM (TEE);  Surgeon: [name], MD;  Location: [facility] ENDOSCOPY;  Service: Cardiovascular;  Laterality: N/A;     A IV Location/Drains/Wounds Patient Lines/Drains/Airways Status   Active Line/Drains/Airways    Name:   Placement date:   Placement time:   Site:   Days:   Peripheral IV 09/13/18 Right Antecubital   09/13/18    1415    Antecubital   less than 1          Intake/Output Last 24 hours No intake or output data in the 24 hours ending 09/13/18 1720  Labs/Imaging Results for orders placed or  performed during the hospital encounter of 09/13/18 (from the past 48 hour(s))  Lipase, blood     Status: Abnormal   Collection Time: 09/13/18  2:13 PM  Result Value Ref Range   Lipase 715 (H) 11 - 51 U/L    Comment: RESULTS CONFIRMED BY MANUAL DILUTION Performed at [facility], [address], [place], [place] [address]   Comprehensive metabolic panel     Status: Abnormal   Collection Time: 09/13/18  2:13 PM  Result Value Ref Range   Sodium 139 135 - 145 mmol/L   Potassium 3.4 (L) 3.5 - 5.1 mmol/L   Chloride 104 98 - 111 mmol/L   CO2 25 22 - 32 mmol/L   Glucose, Bld 129 (H) 70 - 99 mg/dL   BUN 29 (H) 8 - 23 mg/dL   Creatinine, Ser 1.55 (H) 0.44 - 1.00 mg/dL   Calcium 9.1 8.9 - 10.3 mg/dL   Total Protein 7.7 6.5 - 8.1 g/dL   Albumin 3.3 (L) 3.5 - 5.0 g/dL   AST 80 (H) 15 - 41 U/L   ALT 75 (H) 0 - 44 U/L   Alkaline Phosphatase 158 (H) 38 - 126 U/L   Total Bilirubin 2.1 (H) 0.3 - 1.2 mg/dL   GFR calc non Af Amer 31 (L) >60 mL/min   GFR calc Af Amer 36 (L) >60 mL/min   Anion gap 10 5 - 15    Comment: Performed at [facility],  [address], [place], [place] [address]  CBC     Status: Abnormal   Collection Time: 09/13/18  2:13 PM  Result Value Ref Range   WBC 3.7 (L) 4.0 - 10.5 K/uL   RBC 3.99 3.87 - 5.11 MIL/uL   Hemoglobin 11.9 (L) 12.0 - 15.0 g/dL   HCT 36.7 36.0 - 46.0 %   MCV 92.0 80.0 - 100.0 fL   MCH 29.8 26.0 - 34.0 pg   MCHC 32.4 30.0 - 36.0 g/dL   RDW 12.8 11.5 - 15.5 %   Platelets 144 (L) 150 - 400 K/uL   nRBC 0.0 0.0 - 0.2 %    Comment: Performed at [facility], [address], [place], [place] [address]  Urinalysis, Routine w reflex microscopic     Status: Abnormal   Collection Time: 09/13/18  2:13 PM  Result Value Ref Range   Color, Urine AMBER (A) YELLOW    Comment: BIOCHEMICALS MAY BE AFFECTED BY COLOR   APPearance HAZY (A) CLEAR   Specific Gravity, Urine 1.025 1.005 - 1.030   pH 5.0 5.0 - 8.0   Glucose, UA NEGATIVE NEGATIVE mg/dL   Hgb urine dipstick  NEGATIVE NEGATIVE   Bilirubin Urine SMALL (A) NEGATIVE   Ketones, ur NEGATIVE NEGATIVE mg/dL   Protein, ur 100 (A) NEGATIVE mg/dL   Nitrite NEGATIVE NEGATIVE   Leukocytes,Ua NEGATIVE NEGATIVE   RBC / HPF 0-5 0 - 5 RBC/hpf   WBC, UA 0-5 0 - 5 WBC/hpf   Bacteria, UA NONE SEEN NONE SEEN   Squamous Epithelial / LPF 0-5 0 - 5   Mucus PRESENT    Hyaline Casts, UA PRESENT     Comment: Performed at [facility], [address] [place], [place] [address]   US Abdomen Limited  Result Date: 09/13/2018 CLINICAL DATA:  Epigastric pain and right upper quadrant pain 3 days. EXAM: ULTRASOUND ABDOMEN LIMITED RIGHT UPPER QUADRANT COMPARISON:  None. FINDINGS: Gallbladder: Evidence of moderate cholelithiasis as the largest stone measures 2 cm. Mild wall thickening as the gallbladder wall measures 5.4 mm. Negative sonographic Murphy sign. No adjacent free fluid. Common bile duct: Diameter: 10.7 mm.  No definite ductal stones. Liver: No focal lesion identified. Within normal limits in parenchymal echogenicity. Portal vein is patent on color Doppler imaging with normal direction of blood flow towards the liver. IMPRESSION: Moderate cholelithiasis with gallbladder wall thickening. Common bile duct dilatation without choledocholithiasis. Findings may be due to acute cholecystitis. Electronically Signed   By: Marin Olp M.D.   On: 09/13/2018 15:44    Pending Labs Unresulted Labs (From admission, onward)    Start     Ordered   Signed and Held  CBC  (heparin)  Once,   R    Comments:  Baseline for heparin therapy IF NOT ALREADY DRAWN.  Notify MD if PLT < 100 K.    Signed and Held   Signed and Held  Creatinine, serum  (heparin)  Once,   R    Comments:  Baseline for heparin therapy IF NOT ALREADY DRAWN.    Signed and Held   Signed and Held  Comprehensive metabolic panel  Tomorrow morning,   R     Signed and Held   Signed and Held  CBC  Tomorrow morning,   R     Signed and Held   Signed and Held  Protime-INR   Tomorrow morning,   R     Signed and Held  Vitals/Pain Today's Vitals   09/13/18 1430 09/13/18 1609 09/13/18 1630 09/13/18 1700  BP: (!) 175/66  (!) 183/66 (!) 199/64  Pulse: (!) 51  (!) 57 (!) 56  Resp:      Temp:      TempSrc:      SpO2: 100%  100% 99%  Weight:      Height:      PainSc:  0-No pain      Isolation Precautions No active isolations  Medications Medications  HYDROmorphone (DILAUDID) injection 0.5 mg (0 mg Intravenous Hold 09/13/18 1610)  0.9 %  sodium chloride infusion ( Intravenous Paused 09/13/18 1707)  cefTRIAXone (ROCEPHIN) 1 g in sodium chloride 0.9 % 100 mL IVPB (has no administration in time range)  metroNIDAZOLE (FLAGYL) IVPB 500 mg (has no administration in time range)  hydrALAZINE (APRESOLINE) injection 5 mg (has no administration in time range)  sodium chloride flush (NS) 0.9 % injection 3 mL (3 mLs Intravenous Given 09/13/18 1535)  alum & mag hydroxide-simeth (MAALOX/MYLANTA) 200-200-20 MG/5ML suspension 30 mL (30 mLs Oral Given 09/13/18 1456)    And  lidocaine (XYLOCAINE) 2 % viscous mouth solution 15 mL (15 mLs Oral Given 09/13/18 1456)  famotidine (PEPCID) IVPB 20 mg premix (0 mg Intravenous Stopped 09/13/18 1535)  cefTRIAXone (ROCEPHIN) 1 g in sodium chloride 0.9 % 100 mL IVPB (0 g Intravenous Stopped 09/13/18 1643)    Mobility walks with device Low fall risk   Focused Assessments     Recommendations: See Admitting Provider Note  Report given to:   Additional Notes:

## 2018-09-13 NOTE — Plan of Care (Signed)

## 2018-09-13 NOTE — ED Triage Notes (Signed)
Patient states she is allergic to milk but had a milkshake on Monday. Has has abdominal pain since. Lasts about 3 hours after eating per patient.

## 2018-09-13 NOTE — H&P (Signed)
TRH H&P   Patient Demographics:    Lisa [name], is a 84 y.o. female  MRN: [id number]   DOB - Jul 09, 1934  Admit Date - 09/13/2018  Outpatient Primary MD for the patient is [name], MD  Referring MD/NP/PA: Dr Wilson Singer  Patient coming from: Home  Chief Complaint  Patient presents with  . Abdominal Pain      HPI:    Lisa [name]  is a 84 y.o. female, with past medical history of hypertension, COPD, she presents to ED secondary to complaints of abdominal pain, nausea and vomiting, reports symptoms started on Monday, reports she did have a milkshake, symptoms after that stopped secondary to lactose intolerance, ports abdominal pain is sharp, intermittent, provoked by eating, no relieving factors, 5/10 intensity, compounded by nausea, vomiting, mostly after greasy food, has any chest pain, shortness of breath(other than her baseline from COPD), no fever, no chills, no sick contact, recent travel. -D her work-up significant for low potassium at 3.4, creatinine elevated at baseline 1.55, she had elevated lipase at 715, AST of 80, ALT of 75, and total bili of 2.1, ultrasound significant for acute cholecystitis, enlarged bile duct, with no evidence of choledocholithiasis, started on Rocephin, general surgery consulted by ED, and I was called to admit.    Review of systems:    In addition to the HPI above,  No Fever-chills, No Headache, No changes with Vision or hearing, No problems swallowing food or Liquids, No Chest pain, Cough or Shortness of Breath, He reports abdominal pain, postprandial, reports nausea and vomiting,Bowel movements are regular, No Blood in stool or Urine, No dysuria, No new skin rashes or bruises, No new joints pains-aches,  No new weakness, tingling, numbness in any extremity, No recent weight gain or loss, No polyuria, polydypsia or polyphagia, No  significant Mental Stressors.  A full 10 point Review of Systems was done, except as stated above, all other Review of Systems were negative.   With Past History of the following :    Past Medical History:  Diagnosis Date  . COPD (chronic obstructive pulmonary disease) ([place])   . Hypertension   . Pyelonephritis 04/06/2014   UTI      Past Surgical History:  Procedure Laterality Date  . ABDOMINAL HYSTERECTOMY    . TEE WITHOUT CARDIOVERSION N/A 04/10/2014   Procedure: TRANSESOPHAGEAL ECHOCARDIOGRAM (TEE);  Surgeon: [name], MD;  Location: [facility];  Service: Cardiovascular;  Laterality: N/A;      Social History:     Social History   Tobacco Use  . Smoking status: Former Smoker    Types: Cigarettes    Last attempt to quit: 05/12/2014    Years since quitting: 4.3  . Smokeless tobacco: Never Used  Substance Use Topics  . Alcohol use: No     Lives - at home  Mobility - with walker     Family  History :     Family History  Problem Relation Age of Onset  . Hypertension Mother   . Thyroid disease Mother   . Hypertension Father   . Thyroid disease Sister   . Thyroid disease Brother   . Thyroid disease Sister     Home Medications:   Prior to Admission medications   Medication Sig Start Date End Date Taking? Authorizing Provider  diltiazem (TIAZAC) 120 MG 24 hr capsule Take 120 mg by mouth daily.  02/05/16  Yes [provider]  Ipratropium-Albuterol (COMBIVENT RESPIMAT) 20-100 MCG/ACT AERS respimat Inhale 1 puff into the lungs every 6 (six) hours.   Yes [provider]  labetalol (NORMODYNE) 300 MG tablet Take 300 mg by mouth 2 (two) times daily.    Yes [provider]  losartan-hydrochlorothiazide (HYZAAR) 100-25 MG per tablet Take 1 tablet by mouth daily. 03/16/14  Yes [provider]  acetaminophen (TYLENOL) 500 MG tablet Take 500 mg by mouth every 6 (six) hours as needed for headache.    [provider]  B  Complex Vitamins (VITAMIN-B COMPLEX PO) Take 1 tablet by mouth daily.    [provider]  cloNIDine (CATAPRES) 0.2 MG tablet Take 0.2 mg by mouth 2 (two) times daily.    [provider]  feeding supplement, ENSURE COMPLETE, (ENSURE COMPLETE) LIQD Take 237 mLs by mouth 2 (two) times daily between meals. 04/15/14   Mikhail, Velta Addison, DO  furosemide (LASIX) 40 MG tablet Take 40 mg by mouth.    [provider]  hydrochlorothiazide (HYDRODIURIL) 25 MG tablet Take 25 mg by mouth daily. 04/29/18   [provider]  ipratropium-albuterol (DUONEB) 0.5-2.5 (3) MG/3ML SOLN Take 3 mLs by nebulization every 6 (six) hours as needed. For shortness of breath    [provider]  LORazepam (ATIVAN) 0.5 MG tablet Take 1 tablet (0.5 mg total) by mouth every 8 (eight) hours as needed for anxiety. 05/12/14   [name], MD  losartan (COZAAR) 100 MG tablet Take 100 mg by mouth daily. 04/29/18   [provider]  mirtazapine (REMERON) 15 MG tablet Take 15 mg by mouth at bedtime.    [provider]  Multiple Vitamins-Minerals (CENTRUM ADULTS PO) Take 1 tablet by mouth daily.    [provider]  [name] (OYSTER CALCIUM) 500 MG TABS tablet Take 500 mg of elemental calcium by mouth daily.    [provider]  potassium chloride SA (K-DUR,KLOR-CON) 20 MEQ tablet Take 2 tablets (40 mEq total) by mouth once. Patient taking differently: Take 40 mEq by mouth daily.  05/12/14   [name], MD     Allergies:    No Known Allergies   Physical Exam:   Vitals  Blood pressure (!) 199/64, pulse (!) 56, temperature 97.6 F (36.4 C), temperature source Oral, resp. rate 16, height 5\' 5"  (1.651 m), weight 49.9 kg, SpO2 99 %.   1. General Pleasant female, in bed, in no apparent distress  2. Normal affect and insight, Not Suicidal or Homicidal, Awake Alert, Oriented X 3.  3. No F.N deficits, ALL C.Nerves Intact, Strength 5/5 all 4 extremities,  Sensation intact all 4 extremities, Plantars down going.  4. Ears and Eyes appear Normal, Conjunctivae clear, PERRLA. Moist Oral Mucosa.  5. Supple Neck, No JVD, No cervical lymphadenopathy appriciated, No Carotid Bruits.  6. Symmetrical Chest wall movement, Good air movement bilaterally, CTAB.  7. RRR, No Gallops, Rubs or Murmurs, No Parasternal Heave.  8. Positive Bowel Sounds,  Abdomen Soft, has right upper quadrant tenderness to palpation. no organomegaly appriciated,No rebound -guarding or rigidity.  9.  No Cyanosis, Normal Skin Turgor, No Skin Rash or Bruise.  10. Good muscle tone,  joints appear normal , no effusions, Normal ROM.  11. No Palpable Lymph Nodes in Neck or Axillae   Data Review:    CBC Recent Labs  Lab 09/13/18 1413  WBC 3.7*  HGB 11.9*  HCT 36.7  PLT 144*  MCV 92.0  MCH 29.8  MCHC 32.4  RDW 12.8   ------------------------------------------------------------------------------------------------------------------  Chemistries  Recent Labs  Lab 09/13/18 1413  NA 139  K 3.4*  CL 104  CO2 25  GLUCOSE 129*  BUN 29*  CREATININE 1.55*  CALCIUM 9.1  AST 80*  ALT 75*  ALKPHOS 158*  BILITOT 2.1*   ------------------------------------------------------------------------------------------------------------------ estimated creatinine clearance is 22.4 mL/min (A) (by C-G formula based on SCr of 1.55 mg/dL (H)). ------------------------------------------------------------------------------------------------------------------ No results for input(s): TSH, T4TOTAL, T3FREE, THYROIDAB in the last 72 hours.  Invalid input(s): FREET3  Coagulation profile No results for input(s): INR, PROTIME in the last 168 hours. ------------------------------------------------------------------------------------------------------------------- No results for input(s): DDIMER in the last 72 hours.  -------------------------------------------------------------------------------------------------------------------  Cardiac Enzymes No results for input(s): CKMB, TROPONINI, MYOGLOBIN in the last 168 hours.  Invalid input(s): CK ------------------------------------------------------------------------------------------------------------------ No results found for: BNP   ---------------------------------------------------------------------------------------------------------------  Urinalysis    Component Value Date/Time   COLORURINE AMBER (A) 09/13/2018 1413   APPEARANCEUR HAZY (A) 09/13/2018 1413   LABSPEC 1.025 09/13/2018 1413   PHURINE 5.0 09/13/2018 1413   GLUCOSEU NEGATIVE 09/13/2018 1413   HGBUR NEGATIVE 09/13/2018 1413   BILIRUBINUR SMALL (A) 09/13/2018 1413   KETONESUR NEGATIVE 09/13/2018 1413   PROTEINUR 100 (A) 09/13/2018 1413   UROBILINOGEN 0.2 06/02/2014 1715   NITRITE NEGATIVE 09/13/2018 1413   LEUKOCYTESUR NEGATIVE 09/13/2018 1413    ----------------------------------------------------------------------------------------------------------------   Imaging Results:    US Abdomen Limited  Result Date: 09/13/2018 CLINICAL DATA:  Epigastric pain and right upper quadrant pain 3 days. EXAM: ULTRASOUND ABDOMEN LIMITED RIGHT UPPER QUADRANT COMPARISON:  None. FINDINGS: Gallbladder: Evidence of moderate cholelithiasis as the largest stone measures 2 cm. Mild wall thickening as the gallbladder wall measures 5.4 mm. Negative sonographic Murphy sign. No adjacent free fluid. Common bile duct: Diameter: 10.7 mm.  No definite ductal stones. Liver: No focal lesion identified. Within normal limits in parenchymal echogenicity. Portal vein is patent on color Doppler imaging with normal direction of blood flow towards the liver. IMPRESSION: Moderate cholelithiasis with gallbladder wall thickening. Common bile duct dilatation without choledocholithiasis. Findings may be due to acute  cholecystitis. Electronically Signed   By: Marin Olp M.D.   On: 09/13/2018 15:44    My personal review of EKG: EKG was not done on admission, I will obtain 1   Assessment & Plan:    Active Problems:   Hypertension   COLD (chronic obstructive lung disease) ([place])   Acute cholecystitis   Gallstone pancreatitis  Gallstone pancreatitis/acute cholecystitis -Patient present with abdominal pain, nausea and vomiting, postprandial, evaded LFTs, total bili, lipase, ultrasound significant for acute cholecystitis picture, with dilated common bile duct, but no evidence of choledocholithiasis. -On IV Rocephin and Flagyl for acute cholecystitis, renal surgery consulted per GI, recommendation to trend her left is, and likely will need laparoscopic cholecystectomy before discharge, for now there is no indication for ERCP, I will obtain MRCP sure there is no gallstones and common bile duct. -This is elevated, will keep on clear liquid diet, will  keep on IV fluids, this is most likely gallstone pancreatitis, repeat lipase in a.m..  Transaminitis  -due  to above, repeat in a.m.  COPD -Patient denies any wheezing, or shortness of breath denies any worsening dyspnea, continue with scheduled ipratropium/albuterol, will encourage to use incentive spirometry  Hypertension -Pressure significantly uncontrolled, will resume home meds and add PRN hydralazine  Hypokalemia -We will replete, recheck in a.m.  CKD stage IV -Avoid nephrotoxic medications   DVT Prophylaxis Heparin - SCDs   AM Labs Ordered, also please review Full Orders  Family Communication: Admission, patients condition and plan of care including tests being ordered have been discussed with the patient who indicate understanding and agree with the plan and Code Status.  Code Status Full  Likely DC to  Home  Condition GUARDED    Consults called: General surgery by ED  Admission status: Inpatient  Time spent in minutes : 60 minutes    Phillips Climes M.D on 09/13/2018 at 5:21 PM  Between 7am to 7pm - Pager - [phone]. After 7pm go to [email or web address] - password [facility]  [facility] - Office  [phone]

## 2018-09-13 NOTE — Progress Notes (Signed)
Patient has an upper airway wheeze or/of unknown sound. She shows this when she is short of breath , it goes away when she is not sob. Its similar to and exercise induced audible wheeze. No wheeze in lung fields other than what is transmitted at this time. She does not wear oxygen. She does have a history of smoking.

## 2018-09-13 NOTE — ED Notes (Signed)
Pt transported to MRI. Pt's IV paused to go to MRI.

## 2018-09-13 NOTE — ED Provider Notes (Signed)
[facility] EMERGENCY DEPARTMENT Provider Note   CSN: [id number] Arrival date & time: 09/13/18  1403    History   Chief Complaint Chief Complaint  Patient presents with  . Abdominal Pain    HPI Lisa [name] is a 84 y.o. female.     HPI  84 year old female with abdominal pain and nausea/vomiting.  Symptom onset Monday.  On Monday she had a milkshake despite knowing that she is lactose intolerant.  Since that time she has had pain in her upper abdomen/epigastrium which is worse after eating.  It will slowly subside over hours and then worsens again when she eats.  She has felt nauseated at times and vomited once last night.  No fevers or chills.  No change in her bowel movements.  No urinary complaints.  Surgical history significant for hysterectomy.  Denies significant NSAID usage.  Denies any alcohol use.  Past Medical History:  Diagnosis Date  . COPD (chronic obstructive pulmonary disease) ([place])   . Hypertension   . Pyelonephritis 04/06/2014   UTI    Patient Active Problem List   Diagnosis Date Noted  . Protein-calorie malnutrition, severe ([place]) 05/09/2014  . Sepsis ([place]) 05/07/2014  . Encephalopathy 05/07/2014  . Back pain 05/07/2014  . Acute renal failure syndrome ([place])   . Bacteremia   . COLD (chronic obstructive lung disease) ([place])   . Viridans streptococci infection   . H/O goiter 04/10/2014  . Moderate protein-calorie malnutrition ([place]) 04/10/2014  . Acute encephalopathy 04/09/2014  . Streptococcus viridans infection 04/08/2014  . Leukocytosis 04/07/2014  . Dehydration 04/07/2014  . Fever 04/06/2014  . UTI (lower urinary tract infection) 04/06/2014  . ARF (acute renal failure) ([place]) 04/06/2014  . Pyelonephritis, acute 04/06/2014  . Hypertension   . COPD (chronic obstructive pulmonary disease) ([place])     Past Surgical History:  Procedure Laterality Date  . ABDOMINAL HYSTERECTOMY    . TEE WITHOUT CARDIOVERSION N/A 04/10/2014   Procedure:  TRANSESOPHAGEAL ECHOCARDIOGRAM (TEE);  Surgeon: [name], MD;  Location: [facility] ENDOSCOPY;  Service: Cardiovascular;  Laterality: N/A;     OB History    Gravida      Para      Term      Preterm      AB      Living  0     SAB      TAB      Ectopic      Multiple      Live Births               Home Medications    Prior to Admission medications   Medication Sig Start Date End Date Taking? Authorizing Provider  acetaminophen (TYLENOL) 500 MG tablet Take 500 mg by mouth every 6 (six) hours as needed for headache.    [provider]  B Complex Vitamins (VITAMIN-B COMPLEX PO) Take 1 tablet by mouth daily.    [provider]  cloNIDine (CATAPRES) 0.2 MG tablet Take 0.2 mg by mouth 2 (two) times daily.    [provider]  diltiazem (TIAZAC) 120 MG 24 hr capsule  02/05/16   [provider]  feeding supplement, ENSURE COMPLETE, (ENSURE COMPLETE) LIQD Take 237 mLs by mouth 2 (two) times daily between meals. 04/15/14   Mikhail, Velta Addison, DO  furosemide (LASIX) 40 MG tablet Take 40 mg by mouth.    [provider]  Ipratropium-Albuterol (COMBIVENT RESPIMAT) 20-100 MCG/ACT AERS respimat Inhale 1 puff into the lungs every 6 (six)  hours.    [provider]  ipratropium-albuterol (DUONEB) 0.5-2.5 (3) MG/3ML SOLN Take 3 mLs by nebulization every 6 (six) hours as needed. For shortness of breath    [provider]  labetalol (NORMODYNE) 300 MG tablet Take 300 mg by mouth 2 (two) times daily.      [provider]  LORazepam (ATIVAN) 0.5 MG tablet Take 1 tablet (0.5 mg total) by mouth every 8 (eight) hours as needed for anxiety. 05/12/14   [name], MD  losartan-hydrochlorothiazide (HYZAAR) 100-25 MG per tablet Take 1 tablet by mouth daily. 03/16/14   [provider]  mirtazapine (REMERON) 15 MG tablet Take 15 mg by mouth at bedtime.    [provider]  Multiple Vitamins-Minerals (CENTRUM ADULTS PO) Take  1 tablet by mouth daily.    [provider]  [name] (OYSTER CALCIUM) 500 MG TABS tablet Take 500 mg of elemental calcium by mouth daily.    [provider]  potassium chloride SA (K-DUR,KLOR-CON) 20 MEQ tablet Take 2 tablets (40 mEq total) by mouth once. Patient taking differently: Take 40 mEq by mouth daily.  05/12/14   [name], MD  traMADol (ULTRAM) 50 MG tablet Take 1 tablet (50 mg total) by mouth every 6 (six) hours as needed for moderate pain. Patient not taking: Reported on 03/08/2016 05/12/14   [name], MD    Family History Family History  Problem Relation Age of Onset  . Hypertension Mother   . Thyroid disease Mother   . Hypertension Father   . Thyroid disease Sister   . Thyroid disease Brother   . Thyroid disease Sister     Social History Social History   Tobacco Use  . Smoking status: Former Smoker    Types: Cigarettes    Last attempt to quit: 05/12/2014    Years since quitting: 4.3  . Smokeless tobacco: Never Used  Substance Use Topics  . Alcohol use: No  . Drug use: No     Allergies   Patient has no known allergies.   Review of Systems Review of Systems  All systems reviewed and negative, other than as noted in HPI.  Physical Exam Updated Vital Signs BP (!) 175/66   Pulse (!) 51   Temp 97.6 F (36.4 C) (Oral)   Resp 16   Ht 5\' 5"  (1.651 m)   Wt 49.9 kg   SpO2 100%   BMI 18.30 kg/m   Physical Exam Vitals signs and nursing note reviewed.  Constitutional:      General: She is not in acute distress.    Appearance: She is well-developed.  HENT:     Head: Normocephalic and atraumatic.  Eyes:     General:        Right eye: No discharge.        Left eye: No discharge.     Conjunctiva/sclera: Conjunctivae normal.  Neck:     Musculoskeletal: Neck supple.  Cardiovascular:     Rate and Rhythm: Normal rate and regular rhythm.     Heart sounds: Normal heart sounds. No murmur. No friction rub. No gallop.    Pulmonary:     Effort: Pulmonary effort is normal. No respiratory distress.     Breath sounds: Normal breath sounds.  Abdominal:     General: There is no distension.     Palpations: Abdomen is soft.     Tenderness: There is abdominal tenderness in the right upper quadrant and epigastric area. There is no guarding or  rebound.  Musculoskeletal:        General: No tenderness.  Skin:    General: Skin is warm and dry.  Neurological:     Mental Status: She is alert.  Psychiatric:        Behavior: Behavior normal.        Thought Content: Thought content normal.      ED Treatments / Results  Labs (all labs ordered are listed, but only abnormal results are displayed) Labs Reviewed  LIPASE, BLOOD - Abnormal; Notable for the following components:      Result Value   Lipase 715 (*)    All other components within normal limits  COMPREHENSIVE METABOLIC PANEL - Abnormal; Notable for the following components:   Potassium 3.4 (*)    Glucose, Bld 129 (*)    BUN 29 (*)    Creatinine, Ser 1.55 (*)    Albumin 3.3 (*)    AST 80 (*)    ALT 75 (*)    Alkaline Phosphatase 158 (*)    Total Bilirubin 2.1 (*)    GFR calc non Af Amer 31 (*)    GFR calc Af Amer 36 (*)    All other components within normal limits  CBC - Abnormal; Notable for the following components:   WBC 3.7 (*)    Hemoglobin 11.9 (*)    Platelets 144 (*)    All other components within normal limits  URINALYSIS, ROUTINE W REFLEX MICROSCOPIC - Abnormal; Notable for the following components:   Color, Urine AMBER (*)    APPearance HAZY (*)    Bilirubin Urine SMALL (*)    Protein, ur 100 (*)    All other components within normal limits    EKG None  Radiology Mr Abdomen Mrcp Wo Contrast  Addendum Date: 09/14/2018   ADDENDUM REPORT: 09/14/2018 08:40 ADDENDUM: Subsequent review of this exam shows an additional finding of pancreas divisum. Otherwise, no change to report below. Electronically Signed   By: Earle Gell M.D.   On:  09/14/2018 08:40   Result Date: 09/14/2018 CLINICAL DATA:  Abnormal liver function tests. Cholelithiasis. Pancreatitis. EXAM: MRI ABDOMEN WITHOUT CONTRAST  (INCLUDING MRCP) TECHNIQUE: Multiplanar multisequence MR imaging of the abdomen was performed. Heavily T2-weighted images of the biliary and pancreatic ducts were obtained, and three-dimensional MRCP images were rendered by post processing. COMPARISON:  Ultrasound same day. FINDINGS: Lower chest: Negative Hepatobiliary: Liver parenchyma appears normal. The gallbladder contains multiple gallstones, the largest up to 1.8 cm in diameter. These are position near the gallbladder neck. Biliary tree does not appear abnormally dilated. No filling defect is seen in the common hepatic duct or common bile duct to suggest choledocholithiasis. The intrahepatic ducts are not dilated. Pancreatic duct is not dilated. Pancreas: The pancreatic duct is not dilated. No gross inflammatory change of the pancreas is identified. No evidence of pancreatic mass. Spleen:  Normal Adrenals/Urinary Tract: No adrenal lesion. Both kidneys appear normal. Stomach/Bowel: No gastrointestinal abnormality is identified. Vascular/Lymphatic:  Negative Other:  None Musculoskeletal: Curvature and degenerative change of the spine. IMPRESSION: Cholelithiasis. Largest stone measures 1.8 cm in diameter. Ductal system appears normal. No evidence of choledocholithiasis. No MRCP finding to allow diagnosis of cholecystitis. Pancreas appears normal. Electronically Signed: By: Nelson Chimes M.D. On: 09/13/2018 18:48   Mr 3d Recon At Scanner  Addendum Date: 09/14/2018   ADDENDUM REPORT: 09/14/2018 08:40 ADDENDUM: Subsequent review of this exam shows an additional finding of pancreas divisum. Otherwise, no change to report below. Electronically Signed  By: Earle Gell M.D.   On: 09/14/2018 08:40   Result Date: 09/14/2018 CLINICAL DATA:  Abnormal liver function tests. Cholelithiasis. Pancreatitis. EXAM: MRI  ABDOMEN WITHOUT CONTRAST  (INCLUDING MRCP) TECHNIQUE: Multiplanar multisequence MR imaging of the abdomen was performed. Heavily T2-weighted images of the biliary and pancreatic ducts were obtained, and three-dimensional MRCP images were rendered by post processing. COMPARISON:  Ultrasound same day. FINDINGS: Lower chest: Negative Hepatobiliary: Liver parenchyma appears normal. The gallbladder contains multiple gallstones, the largest up to 1.8 cm in diameter. These are position near the gallbladder neck. Biliary tree does not appear abnormally dilated. No filling defect is seen in the common hepatic duct or common bile duct to suggest choledocholithiasis. The intrahepatic ducts are not dilated. Pancreatic duct is not dilated. Pancreas: The pancreatic duct is not dilated. No gross inflammatory change of the pancreas is identified. No evidence of pancreatic mass. Spleen:  Normal Adrenals/Urinary Tract: No adrenal lesion. Both kidneys appear normal. Stomach/Bowel: No gastrointestinal abnormality is identified. Vascular/Lymphatic:  Negative Other:  None Musculoskeletal: Curvature and degenerative change of the spine. IMPRESSION: Cholelithiasis. Largest stone measures 1.8 cm in diameter. Ductal system appears normal. No evidence of choledocholithiasis. No MRCP finding to allow diagnosis of cholecystitis. Pancreas appears normal. Electronically Signed: By: Nelson Chimes M.D. On: 09/13/2018 18:48   US Abdomen Limited  Result Date: 09/13/2018 CLINICAL DATA:  Epigastric pain and right upper quadrant pain 3 days. EXAM: ULTRASOUND ABDOMEN LIMITED RIGHT UPPER QUADRANT COMPARISON:  None. FINDINGS: Gallbladder: Evidence of moderate cholelithiasis as the largest stone measures 2 cm. Mild wall thickening as the gallbladder wall measures 5.4 mm. Negative sonographic Murphy sign. No adjacent free fluid. Common bile duct: Diameter: 10.7 mm.  No definite ductal stones. Liver: No focal lesion identified. Within normal limits in  parenchymal echogenicity. Portal vein is patent on color Doppler imaging with normal direction of blood flow towards the liver. IMPRESSION: Moderate cholelithiasis with gallbladder wall thickening. Common bile duct dilatation without choledocholithiasis. Findings may be due to acute cholecystitis. Electronically Signed   By: Marin Olp M.D.   On: 09/13/2018 15:44   Dg Chest Port 1 View  Result Date: 09/14/2018 CLINICAL DATA:  Patient has an upper airway wheeze or/of unknown sound. She shows this when she is short of breath , it goes away when she is not sob. Its similar to and exercise induced audible wheeze. No wheeze in lung fields other than what is transmitted at this time. She does not wear oxygen. She does have a history of smoking.Hx COPD79 year old female with accelerated hypertension and COPD presented to the ED with complaints of ongoing abdominal pain nausea and vomiting and inability to eat for the past several days. EXAM: PORTABLE CHEST 1 VIEW COMPARISON:  05/07/2014 and older exams. FINDINGS: The cardiac silhouette is normal in size. No mediastinal or hilar masses. No evidence of adenopathy. Clear lungs.  No pleural effusion or pneumothorax. Skeletal structures are intact. IMPRESSION: No active disease. Electronically Signed   By: Lajean Manes M.D.   On: 09/14/2018 09:34    Procedures Procedures (including critical care time)  Medications Ordered in ED Medications  HYDROmorphone (DILAUDID) injection 0.5 mg (has no administration in time range)  cefTRIAXone (ROCEPHIN) 1 g in sodium chloride 0.9 % 100 mL IVPB (has no administration in time range)  sodium chloride flush (NS) 0.9 % injection 3 mL (3 mLs Intravenous Given 09/13/18 1535)  alum & mag hydroxide-simeth (MAALOX/MYLANTA) 200-200-20 MG/5ML suspension 30 mL (30 mLs Oral Given 09/13/18 1456)  And  lidocaine (XYLOCAINE) 2 % viscous mouth solution 15 mL (15 mLs Oral Given 09/13/18 1456)  famotidine (PEPCID) IVPB 20 mg premix (0 mg  Intravenous Stopped 09/13/18 1535)     Initial Impression / Assessment and Plan / ED Course  I have reviewed the triage vital signs and the nursing notes.  Pertinent labs & imaging results that were available during my care of the patient were reviewed by me and considered in my medical decision making (see chart for details).        83yF with abdominal pain and n/v. Lipase elevated. [place] with cholelithiasis and GBW thickening. CBD dilated w/o obvious ductal stone. Mild elevation in LFTs. Afebrile. No leukocytosis. Will discuss with surgery initially, possibly GI.   Final Clinical Impressions(s) / ED Diagnoses   Final diagnoses:  Calculus of gallbladder without cholecystitis without obstruction  Abnormal LFTs    ED Discharge Orders    None       Virgel Manifold, MD 09/14/18 1135

## 2018-09-14 ENCOUNTER — Inpatient Hospital Stay (HOSPITAL_COMMUNITY): Payer: Medicare Other

## 2018-09-14 DIAGNOSIS — K851 Biliary acute pancreatitis without necrosis or infection: Principal | ICD-10-CM

## 2018-09-14 DIAGNOSIS — K8020 Calculus of gallbladder without cholecystitis without obstruction: Secondary | ICD-10-CM

## 2018-09-14 LAB — LIPID PANEL
Cholesterol: 166 mg/dL (ref 0–200)
HDL: 59 mg/dL (ref >40)
LDL Cholesterol: 87 mg/dL (ref 0–99)
Total CHOL/HDL Ratio: 2.8 RATIO
Triglycerides: 102 mg/dL (ref <150)
VLDL: 20 mg/dL (ref 0–40)

## 2018-09-14 LAB — COMPREHENSIVE METABOLIC PANEL
ALT: 60 U/L — ABNORMAL HIGH (ref 0–44)
AST: 59 U/L — ABNORMAL HIGH (ref 15–41)
Albumin: 2.9 g/dL — ABNORMAL LOW (ref 3.5–5.0)
Alkaline Phosphatase: 151 U/L — ABNORMAL HIGH (ref 38–126)
Anion gap: 7 (ref 5–15)
BUN: 23 mg/dL (ref 8–23)
CO2: 23 mmol/L (ref 22–32)
Calcium: 8.7 mg/dL — ABNORMAL LOW (ref 8.9–10.3)
Chloride: 108 mmol/L (ref 98–111)
Creatinine, Ser: 1.26 mg/dL — ABNORMAL HIGH (ref 0.44–1.00)
GFR calc Af Amer: 46 mL/min — ABNORMAL LOW (ref >60)
GFR calc non Af Amer: 40 mL/min — ABNORMAL LOW (ref >60)
Glucose, Bld: 88 mg/dL (ref 70–99)
Potassium: 3.8 mmol/L (ref 3.5–5.1)
Sodium: 138 mmol/L (ref 135–145)
Total Bilirubin: 1.4 mg/dL — ABNORMAL HIGH (ref 0.3–1.2)
Total Protein: 7 g/dL (ref 6.5–8.1)

## 2018-09-14 LAB — LIPASE, BLOOD: Lipase: 85 U/L — ABNORMAL HIGH (ref 11–51)

## 2018-09-14 LAB — CBC
HCT: 33.4 % — ABNORMAL LOW (ref 36.0–46.0)
Hemoglobin: 11.1 g/dL — ABNORMAL LOW (ref 12.0–15.0)
MCH: 30.3 pg (ref 26.0–34.0)
MCHC: 33.2 g/dL (ref 30.0–36.0)
MCV: 91.3 fL (ref 80.0–100.0)
Platelets: 133 10*3/uL — ABNORMAL LOW (ref 150–400)
RBC: 3.66 MIL/uL — ABNORMAL LOW (ref 3.87–5.11)
RDW: 12.9 % (ref 11.5–15.5)
WBC: 3.1 10*3/uL — ABNORMAL LOW (ref 4.0–10.5)
nRBC: 0 % (ref 0.0–0.2)

## 2018-09-14 LAB — PROTIME-INR
INR: 0.9 (ref 0.8–1.2)
Prothrombin Time: 12.5 seconds (ref 11.4–15.2)

## 2018-09-14 MED ORDER — HYDRALAZINE HCL 20 MG/ML IJ SOLN
10.0000 mg | Freq: Four times a day (QID) | INTRAMUSCULAR | Status: DC | PRN
  Administered 2018-09-14: 10 mg via INTRAVENOUS
  Filled 2018-09-14: qty 1

## 2018-09-14 NOTE — Progress Notes (Signed)
PROGRESS NOTE    Lisa [name]  WSF:[id number]  DOB: [birth year]  DOA: 09/13/2018 PCP: [name], MD   Brief Admission Hx: 84 year old female with accelerated hypertension and COPD presented to the ED with complaints of ongoing abdominal pain nausea and vomiting and inability to eat for the past several days.  MDM/Assessment & Plan:   1. Gallstone pancreatitis-patient is feeling much better this morning.  She says she feels almost well enough to return home.  She has no fever or chills.  She does have right upper quadrant abdominal pain which is mostly unchanged to minimally improved.  She has no nausea or vomiting this morning.  Continue IV antibiotics and IV fluids and supportive therapy.  Surgery is planning to see her today. 2. Accelerated hypertension-she is on multiple blood pressure medications which have been resumed in addition to IV hydralazine which is ordered for additional blood pressure management as needed. 3. COPD - stable on current home regimen which has been continued. 4. Stage IV CKD- renal function has remained stable. 5. Hypokalemia-repleted 6. Transaminitis- LFTs trending down.   DVT prophylaxis: SCDs Code Status: Full Family Communication: Patient Disposition Plan: Inpatient  Consultants:  Surgery  Procedures:  N/A  Antimicrobials:  Ceftriaxone 09/13/2018  Metronidazole 09/13/2018  Subjective: The patient says she feels much better this morning.  Objective: Vitals:   09/14/18 0524 09/14/18 0558 09/14/18 0734 09/14/18 0843  BP: (!) 197/66 (!) 196/67  (!) 229/77  Pulse: 65 (!) 56  62  Resp: 20     Temp: 98.4 F (36.9 C)     TempSrc: Oral     SpO2: 98%  98%   Weight:      Height:        Intake/Output Summary (Last 24 hours) at 09/14/2018 0908 Last data filed at 09/14/2018 0174 Gross per 24 hour  Intake 1412.51 ml  Output 1200 ml  Net 212.51 ml   Filed Weights   09/13/18 1411 09/13/18 1903  Weight: 49.9 kg 48.8 kg     REVIEW  OF SYSTEMS  As per history otherwise all reviewed and reported negative  Exam:  General exam: Awake, alert, no apparent distress, appears younger than stated age. Respiratory system: Clear. No increased work of breathing. Cardiovascular system: S1 & S2 heard. No JVD, murmurs, gallops, clicks or pedal edema. Gastrointestinal system: Abdomen is nondistended, soft and positive tenderness right upper quadrant with Murphy sign. Normal bowel sounds heard. Central nervous system: Alert and oriented. No focal neurological deficits. Extremities: no cyanosis or clubbing.  Data Reviewed: Basic Metabolic Panel: Recent Labs  Lab 09/13/18 1413 09/14/18 0609  NA 139 138  K 3.4* 3.8  CL 104 108  CO2 25 23  GLUCOSE 129* 88  BUN 29* 23  CREATININE 1.55* 1.26*  CALCIUM 9.1 8.7*   Liver Function Tests: Recent Labs  Lab 09/13/18 1413 09/14/18 0609  AST 80* 59*  ALT 75* 60*  ALKPHOS 158* 151*  BILITOT 2.1* 1.4*  PROT 7.7 7.0  ALBUMIN 3.3* 2.9*   Recent Labs  Lab 09/13/18 1413 09/14/18 0609  LIPASE 715* 85*   No results for input(s): AMMONIA in the last 168 hours. CBC: Recent Labs  Lab 09/13/18 1413 09/14/18 0609  WBC 3.7* 3.1*  HGB 11.9* 11.1*  HCT 36.7 33.4*  MCV 92.0 91.3  PLT 144* 133*   Cardiac Enzymes: No results for input(s): CKTOTAL, CKMB, CKMBINDEX, TROPONINI in the last 168 hours. CBG (last 3)  No results for input(s): GLUCAP in the  last 72 hours. No results found for this or any previous visit (from the past 240 hour(s)).   Studies: Mr Abdomen Mrcp Wo Contrast  Addendum Date: 09/14/2018   ADDENDUM REPORT: 09/14/2018 08:40 ADDENDUM: Subsequent review of this exam shows an additional finding of pancreas divisum. Otherwise, no change to report below. Electronically Signed   By: Earle Gell M.D.   On: 09/14/2018 08:40   Result Date: 09/14/2018 CLINICAL DATA:  Abnormal liver function tests. Cholelithiasis. Pancreatitis. EXAM: MRI ABDOMEN WITHOUT CONTRAST  (INCLUDING  MRCP) TECHNIQUE: Multiplanar multisequence MR imaging of the abdomen was performed. Heavily T2-weighted images of the biliary and pancreatic ducts were obtained, and three-dimensional MRCP images were rendered by post processing. COMPARISON:  Ultrasound same day. FINDINGS: Lower chest: Negative Hepatobiliary: Liver parenchyma appears normal. The gallbladder contains multiple gallstones, the largest up to 1.8 cm in diameter. These are position near the gallbladder neck. Biliary tree does not appear abnormally dilated. No filling defect is seen in the common hepatic duct or common bile duct to suggest choledocholithiasis. The intrahepatic ducts are not dilated. Pancreatic duct is not dilated. Pancreas: The pancreatic duct is not dilated. No gross inflammatory change of the pancreas is identified. No evidence of pancreatic mass. Spleen:  Normal Adrenals/Urinary Tract: No adrenal lesion. Both kidneys appear normal. Stomach/Bowel: No gastrointestinal abnormality is identified. Vascular/Lymphatic:  Negative Other:  None Musculoskeletal: Curvature and degenerative change of the spine. IMPRESSION: Cholelithiasis. Largest stone measures 1.8 cm in diameter. Ductal system appears normal. No evidence of choledocholithiasis. No MRCP finding to allow diagnosis of cholecystitis. Pancreas appears normal. Electronically Signed: By: Nelson Chimes M.D. On: 09/13/2018 18:48   Mr 3d Recon At Scanner  Addendum Date: 09/14/2018   ADDENDUM REPORT: 09/14/2018 08:40 ADDENDUM: Subsequent review of this exam shows an additional finding of pancreas divisum. Otherwise, no change to report below. Electronically Signed   By: Earle Gell M.D.   On: 09/14/2018 08:40   Result Date: 09/14/2018 CLINICAL DATA:  Abnormal liver function tests. Cholelithiasis. Pancreatitis. EXAM: MRI ABDOMEN WITHOUT CONTRAST  (INCLUDING MRCP) TECHNIQUE: Multiplanar multisequence MR imaging of the abdomen was performed. Heavily T2-weighted images of the biliary and  pancreatic ducts were obtained, and three-dimensional MRCP images were rendered by post processing. COMPARISON:  Ultrasound same day. FINDINGS: Lower chest: Negative Hepatobiliary: Liver parenchyma appears normal. The gallbladder contains multiple gallstones, the largest up to 1.8 cm in diameter. These are position near the gallbladder neck. Biliary tree does not appear abnormally dilated. No filling defect is seen in the common hepatic duct or common bile duct to suggest choledocholithiasis. The intrahepatic ducts are not dilated. Pancreatic duct is not dilated. Pancreas: The pancreatic duct is not dilated. No gross inflammatory change of the pancreas is identified. No evidence of pancreatic mass. Spleen:  Normal Adrenals/Urinary Tract: No adrenal lesion. Both kidneys appear normal. Stomach/Bowel: No gastrointestinal abnormality is identified. Vascular/Lymphatic:  Negative Other:  None Musculoskeletal: Curvature and degenerative change of the spine. IMPRESSION: Cholelithiasis. Largest stone measures 1.8 cm in diameter. Ductal system appears normal. No evidence of choledocholithiasis. No MRCP finding to allow diagnosis of cholecystitis. Pancreas appears normal. Electronically Signed: By: Nelson Chimes M.D. On: 09/13/2018 18:48   US Abdomen Limited  Result Date: 09/13/2018 CLINICAL DATA:  Epigastric pain and right upper quadrant pain 3 days. EXAM: ULTRASOUND ABDOMEN LIMITED RIGHT UPPER QUADRANT COMPARISON:  None. FINDINGS: Gallbladder: Evidence of moderate cholelithiasis as the largest stone measures 2 cm. Mild wall thickening as the gallbladder wall measures 5.4 mm. Negative sonographic  Murphy sign. No adjacent free fluid. Common bile duct: Diameter: 10.7 mm.  No definite ductal stones. Liver: No focal lesion identified. Within normal limits in parenchymal echogenicity. Portal vein is patent on color Doppler imaging with normal direction of blood flow towards the liver. IMPRESSION: Moderate cholelithiasis with  gallbladder wall thickening. Common bile duct dilatation without choledocholithiasis. Findings may be due to acute cholecystitis. Electronically Signed   By: Marin Olp M.D.   On: 09/13/2018 15:44   Scheduled Meds:  cloNIDine  0.2 mg Oral BID   diltiazem  120 mg Oral Daily   heparin  5,000 Units Subcutaneous Q8H    HYDROmorphone (DILAUDID) injection  0.5 mg Intravenous Once   ipratropium-albuterol  3 mL Nebulization Q6H   labetalol  300 mg Oral BID   losartan  100 mg Oral Daily   Continuous Infusions:  sodium chloride 70 mL/hr at 09/14/18 0728   cefTRIAXone (ROCEPHIN)  IV     metronidazole 500 mg (09/14/18 0834)    Active Problems:   Hypertension   COLD (chronic obstructive lung disease) ([place])   Acute cholecystitis   Gallstone pancreatitis   Time spent:   Irwin Brakeman, MD [facility] 09/14/2018, 9:08 AM    LOS: 1 day  How to contact the [facility] Attending or Consulting provider [place] or covering provider during after hours [place], for this patient?  1. Check the care team in [facility] and look for a) attending/consulting [facility] provider listed and b) the [facility] team listed 2. Log into [email or web address] and use [place]'s universal password to access. If you do not have the password, please contact the hospital operator. 3. Locate the [facility] provider you are looking for under [facility] and page to a number that you can be directly reached. 4. If you still have difficulty reaching the provider, please page the [facility] (Director on Call) for the Hospitalists listed on amion for assistance.

## 2018-09-14 NOTE — Progress Notes (Signed)
BP 196/74 aat 1028.  Contacted Dr. Wynetta Emery and he said to give 10 mg apresoline now.  Patient in no pain and said she is hungry and wants to eat.  She ambulated to bathroom just now.

## 2018-09-14 NOTE — H&P (View-Only) (Signed)
[facility] Consult  Reason for Consult: Gallstones pancreatitis  Referring Physician:  Dr. Wilson Singer (ED)   Chief Complaint    Abdominal Pain      Lisa [name] is a 84 y.o. female.  HPI: Lisa [name] is a 84 yo who COPD, HTN who presented to the hospital with abdominal pain, nausea/vomiting, that started after she drank a milkshake on Monday. She is normal lactose intolerant and has not had a milkshake in 5 years but decided she wanted one. She says that the pain finally passed after she vomited but then it came back after eating some bacon.  She says that now her pain is resolved and she is only a little tender in her epigastric region.  MRCP done yesterday demonstrated no choledocholithiasis or acute cholecystitis. She is wanting to eat and see if this causes her pain.       Past Medical History:  Diagnosis Date  . COPD (chronic obstructive pulmonary disease) ([place])   . Hypertension   . Pyelonephritis 04/06/2014   UTI    Past Surgical History:  Procedure Laterality Date  . ABDOMINAL HYSTERECTOMY    . TEE WITHOUT CARDIOVERSION N/A 04/10/2014   Procedure: TRANSESOPHAGEAL ECHOCARDIOGRAM (TEE);  Surgeon: [name], MD;  Location: [facility] ENDOSCOPY;  Service: Cardiovascular;  Laterality: N/A;    Family History  Problem Relation Age of Onset  . Hypertension Mother   . Thyroid disease Mother   . Hypertension Father   . Thyroid disease Sister   . Thyroid disease Brother   . Thyroid disease Sister     Social History   Tobacco Use  . Smoking status: Former Smoker    Types: Cigarettes    Last attempt to quit: 05/12/2014    Years since quitting: 4.3  . Smokeless tobacco: Never Used  Substance Use Topics  . Alcohol use: No  . Drug use: No    Medications:  I have reviewed the patient's current medications. Prior to Admission:  Medications Prior to Admission  Medication Sig Dispense Refill Last Dose  . B Complex Vitamins (VITAMIN-B COMPLEX PO) Take 1  tablet by mouth daily.   09/12/2018 at Unknown time  . Cholecalciferol (VITAMIN D3) 125 MCG (5000 UT) CAPS Take 1 capsule by mouth daily.   09/12/2018 at Unknown time  . diltiazem (TIAZAC) 120 MG 24 hr capsule Take 120 mg by mouth daily.    09/13/2018 at Unknown time  . feeding supplement, ENSURE COMPLETE, (ENSURE COMPLETE) LIQD Take 237 mLs by mouth 2 (two) times daily between meals.   Past Month at Unknown time  . furosemide (LASIX) 40 MG tablet Take 40 mg by mouth daily as needed for fluid or edema.    > 30 days  . hydrochlorothiazide (HYDRODIURIL) 25 MG tablet Take 25 mg by mouth daily.   09/13/2018 at Unknown time  . Ipratropium-Albuterol (COMBIVENT RESPIMAT) 20-100 MCG/ACT AERS respimat Inhale 1 puff into the lungs every 6 (six) hours as needed for wheezing or shortness of breath.    09/13/2018 at Unknown time  . ipratropium-albuterol (DUONEB) 0.5-2.5 (3) MG/3ML SOLN Take 3 mLs by nebulization every 6 (six) hours as needed. For shortness of breath   Past Week at Unknown time  . labetalol (NORMODYNE) 300 MG tablet Take 300 mg by mouth 2 (two) times daily.    09/13/2018 at [place]  . losartan (COZAAR) 100 MG tablet Take 100 mg by mouth daily.   09/13/2018 at Unknown time  . Multiple Vitamins-Minerals (CENTRUM ADULTS  PO) Take 1 tablet by mouth daily.   09/12/2018 at Unknown time  . Oyster Shell (OYSTER CALCIUM) 500 MG TABS tablet Take 500 mg of elemental calcium by mouth daily.   09/12/2018 at Unknown time  . vitamin C (ASCORBIC ACID) 500 MG tablet Take 500 mg by mouth daily.   09/12/2018 at Unknown time   Scheduled: . cloNIDine  0.2 mg Oral BID  . diltiazem  120 mg Oral Daily  . heparin  5,000 Units Subcutaneous Q8H  .  HYDROmorphone (DILAUDID) injection  0.5 mg Intravenous Once  . ipratropium-albuterol  3 mL Nebulization Q6H  . labetalol  300 mg Oral BID  . losartan  100 mg Oral Daily   Continuous: . sodium chloride 70 mL/hr at 09/14/18 0746   BWG:[id number], hydrALAZINE, LORazepam, oxyCODONE  No Known  Allergies   ROS:  A comprehensive review of systems was negative except for: Respiratory: positive for COPD, no new SOB or coughing  Gastrointestinal: positive for abdominal pain, nausea and vomiting  Blood pressure (!) 196/74, pulse 62, temperature 98.4 F (36.9 C), temperature source Oral, resp. rate 20, height 5\' 7"  (1.702 m), weight 48.8 kg, SpO2 98 %. Physical Exam Vitals signs reviewed.  Constitutional:      Appearance: She is well-developed.  HENT:     Head: Normocephalic.  Eyes:     Extraocular Movements: Extraocular movements intact.  Cardiovascular:     Rate and Rhythm: Normal rate and regular rhythm.  Pulmonary:     Effort: Pulmonary effort is normal.  Abdominal:     General: There is no distension.     Palpations: Abdomen is soft.     Tenderness: There is abdominal tenderness in the right upper quadrant.     Hernia: No hernia is present.     Comments: Deep palpation RUQ pain  Musculoskeletal: Normal range of motion.  Skin:    General: Skin is warm and dry.  Neurological:     General: No focal deficit present.     Mental Status: She is alert and oriented to person, place, and time.  Psychiatric:        Mood and Affect: Mood normal.        Behavior: Behavior normal.     Results: Results for orders placed or performed during the hospital encounter of 09/13/18 (from the past 48 hour(s))  Lipase, blood     Status: Abnormal   Collection Time: 09/13/18  2:13 PM  Result Value Ref Range   Lipase 715 (H) 11 - 51 U/L    Comment: RESULTS CONFIRMED BY MANUAL DILUTION Performed at [facility], [address] [place], [place] [address]   Comprehensive metabolic panel     Status: Abnormal   Collection Time: 09/13/18  2:13 PM  Result Value Ref Range   Sodium 139 135 - 145 mmol/L   Potassium 3.4 (L) 3.5 - 5.1 mmol/L   Chloride 104 98 - 111 mmol/L   CO2 25 22 - 32 mmol/L   Glucose, Bld 129 (H) 70 - 99 mg/dL   BUN 29 (H) 8 - 23 mg/dL   Creatinine, Ser 1.55 (H) 0.44 -  1.00 mg/dL   Calcium 9.1 8.9 - 10.3 mg/dL   Total Protein 7.7 6.5 - 8.1 g/dL   Albumin 3.3 (L) 3.5 - 5.0 g/dL   AST 80 (H) 15 - 41 U/L   ALT 75 (H) 0 - 44 U/L   Alkaline Phosphatase 158 (H) 38 - 126 U/L   Total Bilirubin 2.1 (  H) 0.3 - 1.2 mg/dL   GFR calc non Af Amer 31 (L) >60 mL/min   GFR calc Af Amer 36 (L) >60 mL/min   Anion gap 10 5 - 15    Comment: Performed at [facility], [address], [place], [place] [address]  CBC     Status: Abnormal   Collection Time: 09/13/18  2:13 PM  Result Value Ref Range   WBC 3.7 (L) 4.0 - 10.5 K/uL   RBC 3.99 3.87 - 5.11 MIL/uL   Hemoglobin 11.9 (L) 12.0 - 15.0 g/dL   HCT 36.7 36.0 - 46.0 %   MCV 92.0 80.0 - 100.0 fL   MCH 29.8 26.0 - 34.0 pg   MCHC 32.4 30.0 - 36.0 g/dL   RDW 12.8 11.5 - 15.5 %   Platelets 144 (L) 150 - 400 K/uL   nRBC 0.0 0.0 - 0.2 %    Comment: Performed at [facility], [address] [place], [place] [address]  Urinalysis, Routine w reflex microscopic     Status: Abnormal   Collection Time: 09/13/18  2:13 PM  Result Value Ref Range   Color, Urine AMBER (A) YELLOW    Comment: BIOCHEMICALS MAY BE AFFECTED BY COLOR   APPearance HAZY (A) CLEAR   Specific Gravity, Urine 1.025 1.005 - 1.030   pH 5.0 5.0 - 8.0   Glucose, UA NEGATIVE NEGATIVE mg/dL   Hgb urine dipstick NEGATIVE NEGATIVE   Bilirubin Urine SMALL (A) NEGATIVE   Ketones, ur NEGATIVE NEGATIVE mg/dL   Protein, ur 100 (A) NEGATIVE mg/dL   Nitrite NEGATIVE NEGATIVE   Leukocytes,Ua NEGATIVE NEGATIVE   RBC / HPF 0-5 0 - 5 RBC/hpf   WBC, UA 0-5 0 - 5 WBC/hpf   Bacteria, UA NONE SEEN NONE SEEN   Squamous Epithelial / LPF 0-5 0 - 5   Mucus PRESENT    Hyaline Casts, UA PRESENT     Comment: Performed at [facility], [address] [place], [place] [address]  Comprehensive metabolic panel     Status: Abnormal   Collection Time: 09/14/18  6:09 AM  Result Value Ref Range   Sodium 138 135 - 145 mmol/L   Potassium 3.8 3.5 - 5.1 mmol/L   Chloride 108 98 - 111  mmol/L   CO2 23 22 - 32 mmol/L   Glucose, Bld 88 70 - 99 mg/dL   BUN 23 8 - 23 mg/dL   Creatinine, Ser 1.26 (H) 0.44 - 1.00 mg/dL   Calcium 8.7 (L) 8.9 - 10.3 mg/dL   Total Protein 7.0 6.5 - 8.1 g/dL   Albumin 2.9 (L) 3.5 - 5.0 g/dL   AST 59 (H) 15 - 41 U/L   ALT 60 (H) 0 - 44 U/L   Alkaline Phosphatase 151 (H) 38 - 126 U/L   Total Bilirubin 1.4 (H) 0.3 - 1.2 mg/dL   GFR calc non Af Amer 40 (L) >60 mL/min   GFR calc Af Amer 46 (L) >60 mL/min   Anion gap 7 5 - 15    Comment: Performed at [facility], [address], [place], [place] [address]  CBC     Status: Abnormal   Collection Time: 09/14/18  6:09 AM  Result Value Ref Range   WBC 3.1 (L) 4.0 - 10.5 K/uL   RBC 3.66 (L) 3.87 - 5.11 MIL/uL   Hemoglobin 11.1 (L) 12.0 - 15.0 g/dL   HCT 33.4 (L) 36.0 - 46.0 %   MCV 91.3 80.0 - 100.0 fL  MCH 30.3 26.0 - 34.0 pg   MCHC 33.2 30.0 - 36.0 g/dL   RDW 12.9 11.5 - 15.5 %   Platelets 133 (L) 150 - 400 K/uL   nRBC 0.0 0.0 - 0.2 %    Comment: Performed at [facility], [address] [place], [place] [address]  Protime-INR     Status: None   Collection Time: 09/14/18  6:09 AM  Result Value Ref Range   Prothrombin Time 12.5 11.4 - 15.2 seconds   INR 0.9 0.8 - 1.2    Comment: (NOTE) INR goal varies based on device and disease states. Performed at [facility], [address] [place], [place] [address]   Lipase, blood     Status: Abnormal   Collection Time: 09/14/18  6:09 AM  Result Value Ref Range   Lipase 85 (H) 11 - 51 U/L    Comment: Performed at [facility], [address], [place], [place] [address]  Lipid panel     Status: None   Collection Time: 09/14/18  6:09 AM  Result Value Ref Range   Cholesterol 166 0 - 200 mg/dL   Triglycerides 102 <150 mg/dL   HDL 59 >40 mg/dL   Total CHOL/HDL Ratio 2.8 RATIO   VLDL 20 0 - 40 mg/dL   LDL Cholesterol 87 0 - 99 mg/dL    Comment:        Total Cholesterol/HDL:CHD Risk Coronary Heart Disease Risk Table                     Men    Women  1/2 Average Risk   3.4   3.3  Average Risk       5.0   4.4  2 X Average Risk   9.6   7.1  3 X Average Risk  23.4   11.0        Use the calculated Patient Ratio above and the CHD Risk Table to determine the patient's CHD Risk.        ATP III CLASSIFICATION (LDL):  <100     mg/dL   Optimal  100-129  mg/dL   Near or Above                    Optimal  130-159  mg/dL   Borderline  160-189  mg/dL   High  >190     mg/dL   Very High Performed at [facility], [address], [place],  [address]    Personally reviewed MRCP and [place]- stones, some mild thickening of the gallbladder but no fluid, no choledocholithiasis   Mr Abdomen Mrcp Wo Contrast  Addendum Date: 09/14/2018   ADDENDUM REPORT: 09/14/2018 08:40 ADDENDUM: Subsequent review of this exam shows an additional finding of pancreas divisum. Otherwise, no change to report below. Electronically Signed   By: Earle Gell M.D.   On: 09/14/2018 08:40   Result Date: 09/14/2018 CLINICAL DATA:  Abnormal liver function tests. Cholelithiasis. Pancreatitis. EXAM: MRI ABDOMEN WITHOUT CONTRAST  (INCLUDING MRCP) TECHNIQUE: Multiplanar multisequence MR imaging of the abdomen was performed. Heavily T2-weighted images of the biliary and pancreatic ducts were obtained, and three-dimensional MRCP images were rendered by post processing. COMPARISON:  Ultrasound same day. FINDINGS: Lower chest: Negative Hepatobiliary: Liver parenchyma appears normal. The gallbladder contains multiple gallstones, the largest up to 1.8 cm in diameter. These are position near the gallbladder neck. Biliary tree does not appear abnormally dilated. No filling defect is seen in the common hepatic duct  or common bile duct to suggest choledocholithiasis. The intrahepatic ducts are not dilated. Pancreatic duct is not dilated. Pancreas: The pancreatic duct is not dilated. No gross inflammatory change of the pancreas is identified. No evidence of pancreatic mass. Spleen:  Normal  Adrenals/Urinary Tract: No adrenal lesion. Both kidneys appear normal. Stomach/Bowel: No gastrointestinal abnormality is identified. Vascular/Lymphatic:  Negative Other:  None Musculoskeletal: Curvature and degenerative change of the spine. IMPRESSION: Cholelithiasis. Largest stone measures 1.8 cm in diameter. Ductal system appears normal. No evidence of choledocholithiasis. No MRCP finding to allow diagnosis of cholecystitis. Pancreas appears normal. Electronically Signed: By: Nelson Chimes M.D. On: 09/13/2018 18:48   Mr 3d Recon At Scanner  Addendum Date: 09/14/2018   ADDENDUM REPORT: 09/14/2018 08:40 ADDENDUM: Subsequent review of this exam shows an additional finding of pancreas divisum. Otherwise, no change to report below. Electronically Signed   By: Earle Gell M.D.   On: 09/14/2018 08:40   Result Date: 09/14/2018 CLINICAL DATA:  Abnormal liver function tests. Cholelithiasis. Pancreatitis. EXAM: MRI ABDOMEN WITHOUT CONTRAST  (INCLUDING MRCP) TECHNIQUE: Multiplanar multisequence MR imaging of the abdomen was performed. Heavily T2-weighted images of the biliary and pancreatic ducts were obtained, and three-dimensional MRCP images were rendered by post processing. COMPARISON:  Ultrasound same day. FINDINGS: Lower chest: Negative Hepatobiliary: Liver parenchyma appears normal. The gallbladder contains multiple gallstones, the largest up to 1.8 cm in diameter. These are position near the gallbladder neck. Biliary tree does not appear abnormally dilated. No filling defect is seen in the common hepatic duct or common bile duct to suggest choledocholithiasis. The intrahepatic ducts are not dilated. Pancreatic duct is not dilated. Pancreas: The pancreatic duct is not dilated. No gross inflammatory change of the pancreas is identified. No evidence of pancreatic mass. Spleen:  Normal Adrenals/Urinary Tract: No adrenal lesion. Both kidneys appear normal. Stomach/Bowel: No gastrointestinal abnormality is identified.  Vascular/Lymphatic:  Negative Other:  None Musculoskeletal: Curvature and degenerative change of the spine. IMPRESSION: Cholelithiasis. Largest stone measures 1.8 cm in diameter. Ductal system appears normal. No evidence of choledocholithiasis. No MRCP finding to allow diagnosis of cholecystitis. Pancreas appears normal. Electronically Signed: By: Nelson Chimes M.D. On: 09/13/2018 18:48   US Abdomen Limited  Result Date: 09/13/2018 CLINICAL DATA:  Epigastric pain and right upper quadrant pain 3 days. EXAM: ULTRASOUND ABDOMEN LIMITED RIGHT UPPER QUADRANT COMPARISON:  None. FINDINGS: Gallbladder: Evidence of moderate cholelithiasis as the largest stone measures 2 cm. Mild wall thickening as the gallbladder wall measures 5.4 mm. Negative sonographic Murphy sign. No adjacent free fluid. Common bile duct: Diameter: 10.7 mm.  No definite ductal stones. Liver: No focal lesion identified. Within normal limits in parenchymal echogenicity. Portal vein is patent on color Doppler imaging with normal direction of blood flow towards the liver. IMPRESSION: Moderate cholelithiasis with gallbladder wall thickening. Common bile duct dilatation without choledocholithiasis. Findings may be due to acute cholecystitis. Electronically Signed   By: Marin Olp M.D.   On: 09/13/2018 15:44   Dg Chest Port 1 View  Result Date: 09/14/2018 CLINICAL DATA:  Patient has an upper airway wheeze or/of unknown sound. She shows this when she is short of breath , it goes away when she is not sob. Its similar to and exercise induced audible wheeze. No wheeze in lung fields other than what is transmitted at this time. She does not wear oxygen. She does have a history of smoking.Hx COPD17 year old female with accelerated hypertension and COPD presented to the ED with complaints of ongoing abdominal pain nausea  and vomiting and inability to eat for the past several days. EXAM: PORTABLE CHEST 1 VIEW COMPARISON:  05/07/2014 and older exams. FINDINGS:  The cardiac silhouette is normal in size. No mediastinal or hilar masses. No evidence of adenopathy. Clear lungs.  No pleural effusion or pneumothorax. Skeletal structures are intact. IMPRESSION: No active disease. Electronically Signed   By: Lajean Manes M.D.   On: 09/14/2018 09:34     Assessment & Plan:  Liviah Cake is a 84 y.o. female with gallstone pancreatitis that is resolving. She had an MRCP that verified no choledocholithiasis and demonstrates no acute cholecystitis. She is feeling better overall.  We discussed that gallstone pancreatitis is a reason to get the gallbladder out due to a risk of 30-40% risk of recurrence of pancreatitis in the upcoming 4-6 weeks.  We discussed that we could do this this hospitalization versus we could delay for a few weeks.  - Full liquid diet  - Trend labs, LFT trending down  - D/c the antibiotics as no signs of cholecystitis or cholangitis  - Will discuss lap chole tomorrow, would plan for Monday versus outpatient electively after COVID 19 pandemic has ended, we will discuss further    All questions were answered to the satisfaction of the patient.    Lisa [name] 09/14/2018, 12:38 PM

## 2018-09-14 NOTE — Consult Note (Signed)
[facility] Consult  Reason for Consult: Gallstones pancreatitis  Referring Physician:  Dr. Wilson Singer (ED)   Chief Complaint    Abdominal Pain      Lisa [name] is a 84 y.o. female.  HPI: [name] is a 84 yo who COPD, HTN who presented to the hospital with abdominal pain, nausea/vomiting, that started after she drank a milkshake on Monday. She is normal lactose intolerant and has not had a milkshake in 5 years but decided she wanted one. She says that the pain finally passed after she vomited but then it came back after eating some bacon.  She says that now her pain is resolved and she is only a little tender in her epigastric region.  MRCP done yesterday demonstrated no choledocholithiasis or acute cholecystitis. She is wanting to eat and see if this causes her pain.       Past Medical History:  Diagnosis Date  . COPD (chronic obstructive pulmonary disease) ([place])   . Hypertension   . Pyelonephritis 04/06/2014   UTI    Past Surgical History:  Procedure Laterality Date  . ABDOMINAL HYSTERECTOMY    . TEE WITHOUT CARDIOVERSION N/A 04/10/2014   Procedure: TRANSESOPHAGEAL ECHOCARDIOGRAM (TEE);  Surgeon: [name], MD;  Location: [facility] ENDOSCOPY;  Service: Cardiovascular;  Laterality: N/A;    Family History  Problem Relation Age of Onset  . Hypertension Mother   . Thyroid disease Mother   . Hypertension Father   . Thyroid disease Sister   . Thyroid disease Brother   . Thyroid disease Sister     Social History   Tobacco Use  . Smoking status: Former Smoker    Types: Cigarettes    Last attempt to quit: 05/12/2014    Years since quitting: 4.3  . Smokeless tobacco: Never Used  Substance Use Topics  . Alcohol use: No  . Drug use: No    Medications:  I have reviewed the patient's current medications. Prior to Admission:  Medications Prior to Admission  Medication Sig Dispense Refill Last Dose  . B Complex Vitamins (VITAMIN-B COMPLEX PO) Take 1  tablet by mouth daily.   09/12/2018 at Unknown time  . Cholecalciferol (VITAMIN D3) 125 MCG (5000 UT) CAPS Take 1 capsule by mouth daily.   09/12/2018 at Unknown time  . diltiazem (TIAZAC) 120 MG 24 hr capsule Take 120 mg by mouth daily.    09/13/2018 at Unknown time  . feeding supplement, ENSURE COMPLETE, (ENSURE COMPLETE) LIQD Take 237 mLs by mouth 2 (two) times daily between meals.   Past Month at Unknown time  . furosemide (LASIX) 40 MG tablet Take 40 mg by mouth daily as needed for fluid or edema.    > 30 days  . hydrochlorothiazide (HYDRODIURIL) 25 MG tablet Take 25 mg by mouth daily.   09/13/2018 at Unknown time  . Ipratropium-Albuterol (COMBIVENT RESPIMAT) 20-100 MCG/ACT AERS respimat Inhale 1 puff into the lungs every 6 (six) hours as needed for wheezing or shortness of breath.    09/13/2018 at Unknown time  . ipratropium-albuterol (DUONEB) 0.5-2.5 (3) MG/3ML SOLN Take 3 mLs by nebulization every 6 (six) hours as needed. For shortness of breath   Past Week at Unknown time  . labetalol (NORMODYNE) 300 MG tablet Take 300 mg by mouth 2 (two) times daily.    09/13/2018 at [place]  . losartan (COZAAR) 100 MG tablet Take 100 mg by mouth daily.   09/13/2018 at Unknown time  . Multiple Vitamins-Minerals (CENTRUM ADULTS  PO) Take 1 tablet by mouth daily.   09/12/2018 at Unknown time  . Oyster Shell (OYSTER CALCIUM) 500 MG TABS tablet Take 500 mg of elemental calcium by mouth daily.   09/12/2018 at Unknown time  . vitamin C (ASCORBIC ACID) 500 MG tablet Take 500 mg by mouth daily.   09/12/2018 at Unknown time   Scheduled: . cloNIDine  0.2 mg Oral BID  . diltiazem  120 mg Oral Daily  . heparin  5,000 Units Subcutaneous Q8H  .  HYDROmorphone (DILAUDID) injection  0.5 mg Intravenous Once  . ipratropium-albuterol  3 mL Nebulization Q6H  . labetalol  300 mg Oral BID  . losartan  100 mg Oral Daily   Continuous: . sodium chloride 70 mL/hr at 09/14/18 0746   XHB:[id number], hydrALAZINE, LORazepam, oxyCODONE  No Known  Allergies   ROS:  A comprehensive review of systems was negative except for: Respiratory: positive for COPD, no new SOB or coughing  Gastrointestinal: positive for abdominal pain, nausea and vomiting  Blood pressure (!) 196/74, pulse 62, temperature 98.4 F (36.9 C), temperature source Oral, resp. rate 20, height 5\' 7"  (1.702 m), weight 48.8 kg, SpO2 98 %. Physical Exam Vitals signs reviewed.  Constitutional:      Appearance: She is well-developed.  HENT:     Head: Normocephalic.  Eyes:     Extraocular Movements: Extraocular movements intact.  Cardiovascular:     Rate and Rhythm: Normal rate and regular rhythm.  Pulmonary:     Effort: Pulmonary effort is normal.  Abdominal:     General: There is no distension.     Palpations: Abdomen is soft.     Tenderness: There is abdominal tenderness in the right upper quadrant.     Hernia: No hernia is present.     Comments: Deep palpation RUQ pain  Musculoskeletal: Normal range of motion.  Skin:    General: Skin is warm and dry.  Neurological:     General: No focal deficit present.     Mental Status: She is alert and oriented to person, place, and time.  Psychiatric:        Mood and Affect: Mood normal.        Behavior: Behavior normal.     Results: Results for orders placed or performed during the hospital encounter of 09/13/18 (from the past 48 hour(s))  Lipase, blood     Status: Abnormal   Collection Time: 09/13/18  2:13 PM  Result Value Ref Range   Lipase 715 (H) 11 - 51 U/L    Comment: RESULTS CONFIRMED BY MANUAL DILUTION Performed at [facility], [address] [place], [place] [address]   Comprehensive metabolic panel     Status: Abnormal   Collection Time: 09/13/18  2:13 PM  Result Value Ref Range   Sodium 139 135 - 145 mmol/L   Potassium 3.4 (L) 3.5 - 5.1 mmol/L   Chloride 104 98 - 111 mmol/L   CO2 25 22 - 32 mmol/L   Glucose, Bld 129 (H) 70 - 99 mg/dL   BUN 29 (H) 8 - 23 mg/dL   Creatinine, Ser 1.55 (H) 0.44 -  1.00 mg/dL   Calcium 9.1 8.9 - 10.3 mg/dL   Total Protein 7.7 6.5 - 8.1 g/dL   Albumin 3.3 (L) 3.5 - 5.0 g/dL   AST 80 (H) 15 - 41 U/L   ALT 75 (H) 0 - 44 U/L   Alkaline Phosphatase 158 (H) 38 - 126 U/L   Total Bilirubin 2.1 (  H) 0.3 - 1.2 mg/dL   GFR calc non Af Amer 31 (L) >60 mL/min   GFR calc Af Amer 36 (L) >60 mL/min   Anion gap 10 5 - 15    Comment: Performed at [facility], [address], [place], [place] [address]  CBC     Status: Abnormal   Collection Time: 09/13/18  2:13 PM  Result Value Ref Range   WBC 3.7 (L) 4.0 - 10.5 K/uL   RBC 3.99 3.87 - 5.11 MIL/uL   Hemoglobin 11.9 (L) 12.0 - 15.0 g/dL   HCT 36.7 36.0 - 46.0 %   MCV 92.0 80.0 - 100.0 fL   MCH 29.8 26.0 - 34.0 pg   MCHC 32.4 30.0 - 36.0 g/dL   RDW 12.8 11.5 - 15.5 %   Platelets 144 (L) 150 - 400 K/uL   nRBC 0.0 0.0 - 0.2 %    Comment: Performed at [facility], [address] [place], [place] [address]  Urinalysis, Routine w reflex microscopic     Status: Abnormal   Collection Time: 09/13/18  2:13 PM  Result Value Ref Range   Color, Urine AMBER (A) YELLOW    Comment: BIOCHEMICALS MAY BE AFFECTED BY COLOR   APPearance HAZY (A) CLEAR   Specific Gravity, Urine 1.025 1.005 - 1.030   pH 5.0 5.0 - 8.0   Glucose, UA NEGATIVE NEGATIVE mg/dL   Hgb urine dipstick NEGATIVE NEGATIVE   Bilirubin Urine SMALL (A) NEGATIVE   Ketones, ur NEGATIVE NEGATIVE mg/dL   Protein, ur 100 (A) NEGATIVE mg/dL   Nitrite NEGATIVE NEGATIVE   Leukocytes,Ua NEGATIVE NEGATIVE   RBC / HPF 0-5 0 - 5 RBC/hpf   WBC, UA 0-5 0 - 5 WBC/hpf   Bacteria, UA NONE SEEN NONE SEEN   Squamous Epithelial / LPF 0-5 0 - 5   Mucus PRESENT    Hyaline Casts, UA PRESENT     Comment: Performed at [facility], [address] [place], [place] [address]  Comprehensive metabolic panel     Status: Abnormal   Collection Time: 09/14/18  6:09 AM  Result Value Ref Range   Sodium 138 135 - 145 mmol/L   Potassium 3.8 3.5 - 5.1 mmol/L   Chloride 108 98 - 111  mmol/L   CO2 23 22 - 32 mmol/L   Glucose, Bld 88 70 - 99 mg/dL   BUN 23 8 - 23 mg/dL   Creatinine, Ser 1.26 (H) 0.44 - 1.00 mg/dL   Calcium 8.7 (L) 8.9 - 10.3 mg/dL   Total Protein 7.0 6.5 - 8.1 g/dL   Albumin 2.9 (L) 3.5 - 5.0 g/dL   AST 59 (H) 15 - 41 U/L   ALT 60 (H) 0 - 44 U/L   Alkaline Phosphatase 151 (H) 38 - 126 U/L   Total Bilirubin 1.4 (H) 0.3 - 1.2 mg/dL   GFR calc non Af Amer 40 (L) >60 mL/min   GFR calc Af Amer 46 (L) >60 mL/min   Anion gap 7 5 - 15    Comment: Performed at [facility], [address], [place],  [address]  CBC     Status: Abnormal   Collection Time: 09/14/18  6:09 AM  Result Value Ref Range   WBC 3.1 (L) 4.0 - 10.5 K/uL   RBC 3.66 (L) 3.87 - 5.11 MIL/uL   Hemoglobin 11.1 (L) 12.0 - 15.0 g/dL   HCT 33.4 (L) 36.0 - 46.0 %   MCV 91.3 80.0 - 100.0 fL  MCH 30.3 26.0 - 34.0 pg   MCHC 33.2 30.0 - 36.0 g/dL   RDW 12.9 11.5 - 15.5 %   Platelets 133 (L) 150 - 400 K/uL   nRBC 0.0 0.0 - 0.2 %    Comment: Performed at [name], [address], [place], [place] [address]  Protime-INR     Status: None   Collection Time: 09/14/18  6:09 AM  Result Value Ref Range   Prothrombin Time 12.5 11.4 - 15.2 seconds   INR 0.9 0.8 - 1.2    Comment: (NOTE) INR goal varies based on device and disease states. Performed at [facility], [address] [place], [place] [address]   Lipase, blood     Status: Abnormal   Collection Time: 09/14/18  6:09 AM  Result Value Ref Range   Lipase 85 (H) 11 - 51 U/L    Comment: Performed at [facility], [address] [place], [place] [address]  Lipid panel     Status: None   Collection Time: 09/14/18  6:09 AM  Result Value Ref Range   Cholesterol 166 0 - 200 mg/dL   Triglycerides 102 <150 mg/dL   HDL 59 >40 mg/dL   Total CHOL/HDL Ratio 2.8 RATIO   VLDL 20 0 - 40 mg/dL   LDL Cholesterol 87 0 - 99 mg/dL    Comment:        Total Cholesterol/HDL:CHD Risk Coronary Heart Disease Risk Table                     Men    Women  1/2 Average Risk   3.4   3.3  Average Risk       5.0   4.4  2 X Average Risk   9.6   7.1  3 X Average Risk  23.4   11.0        Use the calculated Patient Ratio above and the CHD Risk Table to determine the patient's CHD Risk.        ATP III CLASSIFICATION (LDL):  <100     mg/dL   Optimal  100-129  mg/dL   Near or Above                    Optimal  130-159  mg/dL   Borderline  160-189  mg/dL   High  >190     mg/dL   Very High Performed at [facility], [address], [place], [place] [address]    Personally reviewed MRCP and [place]- stones, some mild thickening of the gallbladder but no fluid, no choledocholithiasis   Mr Abdomen Mrcp Wo Contrast  Addendum Date: 09/14/2018   ADDENDUM REPORT: 09/14/2018 08:40 ADDENDUM: Subsequent review of this exam shows an additional finding of pancreas divisum. Otherwise, no change to report below. Electronically Signed   By: Earle Gell M.D.   On: 09/14/2018 08:40   Result Date: 09/14/2018 CLINICAL DATA:  Abnormal liver function tests. Cholelithiasis. Pancreatitis. EXAM: MRI ABDOMEN WITHOUT CONTRAST  (INCLUDING MRCP) TECHNIQUE: Multiplanar multisequence MR imaging of the abdomen was performed. Heavily T2-weighted images of the biliary and pancreatic ducts were obtained, and three-dimensional MRCP images were rendered by post processing. COMPARISON:  Ultrasound same day. FINDINGS: Lower chest: Negative Hepatobiliary: Liver parenchyma appears normal. The gallbladder contains multiple gallstones, the largest up to 1.8 cm in diameter. These are position near the gallbladder neck. Biliary tree does not appear abnormally dilated. No filling defect is seen in the common hepatic duct  or common bile duct to suggest choledocholithiasis. The intrahepatic ducts are not dilated. Pancreatic duct is not dilated. Pancreas: The pancreatic duct is not dilated. No gross inflammatory change of the pancreas is identified. No evidence of pancreatic mass. Spleen:  Normal  Adrenals/Urinary Tract: No adrenal lesion. Both kidneys appear normal. Stomach/Bowel: No gastrointestinal abnormality is identified. Vascular/Lymphatic:  Negative Other:  None Musculoskeletal: Curvature and degenerative change of the spine. IMPRESSION: Cholelithiasis. Largest stone measures 1.8 cm in diameter. Ductal system appears normal. No evidence of choledocholithiasis. No MRCP finding to allow diagnosis of cholecystitis. Pancreas appears normal. Electronically Signed: By: Nelson Chimes M.D. On: 09/13/2018 18:48   Mr 3d Recon At Scanner  Addendum Date: 09/14/2018   ADDENDUM REPORT: 09/14/2018 08:40 ADDENDUM: Subsequent review of this exam shows an additional finding of pancreas divisum. Otherwise, no change to report below. Electronically Signed   By: Earle Gell M.D.   On: 09/14/2018 08:40   Result Date: 09/14/2018 CLINICAL DATA:  Abnormal liver function tests. Cholelithiasis. Pancreatitis. EXAM: MRI ABDOMEN WITHOUT CONTRAST  (INCLUDING MRCP) TECHNIQUE: Multiplanar multisequence MR imaging of the abdomen was performed. Heavily T2-weighted images of the biliary and pancreatic ducts were obtained, and three-dimensional MRCP images were rendered by post processing. COMPARISON:  Ultrasound same day. FINDINGS: Lower chest: Negative Hepatobiliary: Liver parenchyma appears normal. The gallbladder contains multiple gallstones, the largest up to 1.8 cm in diameter. These are position near the gallbladder neck. Biliary tree does not appear abnormally dilated. No filling defect is seen in the common hepatic duct or common bile duct to suggest choledocholithiasis. The intrahepatic ducts are not dilated. Pancreatic duct is not dilated. Pancreas: The pancreatic duct is not dilated. No gross inflammatory change of the pancreas is identified. No evidence of pancreatic mass. Spleen:  Normal Adrenals/Urinary Tract: No adrenal lesion. Both kidneys appear normal. Stomach/Bowel: No gastrointestinal abnormality is identified.  Vascular/Lymphatic:  Negative Other:  None Musculoskeletal: Curvature and degenerative change of the spine. IMPRESSION: Cholelithiasis. Largest stone measures 1.8 cm in diameter. Ductal system appears normal. No evidence of choledocholithiasis. No MRCP finding to allow diagnosis of cholecystitis. Pancreas appears normal. Electronically Signed: By: Nelson Chimes M.D. On: 09/13/2018 18:48   US Abdomen Limited  Result Date: 09/13/2018 CLINICAL DATA:  Epigastric pain and right upper quadrant pain 3 days. EXAM: ULTRASOUND ABDOMEN LIMITED RIGHT UPPER QUADRANT COMPARISON:  None. FINDINGS: Gallbladder: Evidence of moderate cholelithiasis as the largest stone measures 2 cm. Mild wall thickening as the gallbladder wall measures 5.4 mm. Negative sonographic Murphy sign. No adjacent free fluid. Common bile duct: Diameter: 10.7 mm.  No definite ductal stones. Liver: No focal lesion identified. Within normal limits in parenchymal echogenicity. Portal vein is patent on color Doppler imaging with normal direction of blood flow towards the liver. IMPRESSION: Moderate cholelithiasis with gallbladder wall thickening. Common bile duct dilatation without choledocholithiasis. Findings may be due to acute cholecystitis. Electronically Signed   By: Marin Olp M.D.   On: 09/13/2018 15:44   Dg Chest Port 1 View  Result Date: 09/14/2018 CLINICAL DATA:  Patient has an upper airway wheeze or/of unknown sound. She shows this when she is short of breath , it goes away when she is not sob. Its similar to and exercise induced audible wheeze. No wheeze in lung fields other than what is transmitted at this time. She does not wear oxygen. She does have a history of smoking.Hx COPD69 year old female with accelerated hypertension and COPD presented to the ED with complaints of ongoing abdominal pain nausea  and vomiting and inability to eat for the past several days. EXAM: PORTABLE CHEST 1 VIEW COMPARISON:  05/07/2014 and older exams. FINDINGS:  The cardiac silhouette is normal in size. No mediastinal or hilar masses. No evidence of adenopathy. Clear lungs.  No pleural effusion or pneumothorax. Skeletal structures are intact. IMPRESSION: No active disease. Electronically Signed   By: Lajean Manes M.D.   On: 09/14/2018 09:34     Assessment & Plan:  Lisa [name] is a 84 y.o. female with gallstone pancreatitis that is resolving. She had an MRCP that verified no choledocholithiasis and demonstrates no acute cholecystitis. She is feeling better overall.  We discussed that gallstone pancreatitis is a reason to get the gallbladder out due to a risk of 30-40% risk of recurrence of pancreatitis in the upcoming 4-6 weeks.  We discussed that we could do this this hospitalization versus we could delay for a few weeks.  - Full liquid diet  - Trend labs, LFT trending down  - D/c the antibiotics as no signs of cholecystitis or cholangitis  - Will discuss lap chole tomorrow, would plan for Monday versus outpatient electively after COVID 19 pandemic has ended, we will discuss further    All questions were answered to the satisfaction of the patient.    Virl Cagey 09/14/2018, 12:38 PM

## 2018-09-14 NOTE — Progress Notes (Signed)
BP at 0600  196/67 and had 5mg  apresoline.  BP just now 229/77  Pulse 62.  Gave scheduled labetalol 300 mg, clonidine  0.2 mg, diltiazem 120 mg and losartan  100 mg.  Sent message to Dr. Wynetta Emery with this information.  Patient alert and oriented, denies abd pain or nausea.

## 2018-09-15 DIAGNOSIS — R945 Abnormal results of liver function studies: Secondary | ICD-10-CM

## 2018-09-15 DIAGNOSIS — R7989 Other specified abnormal findings of blood chemistry: Secondary | ICD-10-CM

## 2018-09-15 LAB — CBC WITH DIFFERENTIAL/PLATELET
Abs Immature Granulocytes: 0.01 10*3/uL (ref 0.00–0.07)
Basophils Absolute: 0 10*3/uL (ref 0.0–0.1)
Basophils Relative: 1 %
Eosinophils Absolute: 0 10*3/uL (ref 0.0–0.5)
Eosinophils Relative: 1 %
HCT: 32.9 % — ABNORMAL LOW (ref 36.0–46.0)
Hemoglobin: 10.8 g/dL — ABNORMAL LOW (ref 12.0–15.0)
Immature Granulocytes: 0 %
Lymphocytes Relative: 31 %
Lymphs Abs: 1.2 10*3/uL (ref 0.7–4.0)
MCH: 30 pg (ref 26.0–34.0)
MCHC: 32.8 g/dL (ref 30.0–36.0)
MCV: 91.4 fL (ref 80.0–100.0)
Monocytes Absolute: 0.4 10*3/uL (ref 0.1–1.0)
Monocytes Relative: 11 %
Neutro Abs: 2.3 10*3/uL (ref 1.7–7.7)
Neutrophils Relative %: 56 %
Platelets: 131 10*3/uL — ABNORMAL LOW (ref 150–400)
RBC: 3.6 MIL/uL — ABNORMAL LOW (ref 3.87–5.11)
RDW: 13.1 % (ref 11.5–15.5)
WBC: 4 10*3/uL (ref 4.0–10.5)
nRBC: 0 % (ref 0.0–0.2)

## 2018-09-15 LAB — COMPREHENSIVE METABOLIC PANEL
ALT: 52 U/L — ABNORMAL HIGH (ref 0–44)
AST: 49 U/L — ABNORMAL HIGH (ref 15–41)
Albumin: 3 g/dL — ABNORMAL LOW (ref 3.5–5.0)
Alkaline Phosphatase: 126 U/L (ref 38–126)
Anion gap: 8 (ref 5–15)
BUN: 17 mg/dL (ref 8–23)
CO2: 21 mmol/L — ABNORMAL LOW (ref 22–32)
Calcium: 8.8 mg/dL — ABNORMAL LOW (ref 8.9–10.3)
Chloride: 108 mmol/L (ref 98–111)
Creatinine, Ser: 1.1 mg/dL — ABNORMAL HIGH (ref 0.44–1.00)
GFR calc Af Amer: 55 mL/min — ABNORMAL LOW (ref >60)
GFR calc non Af Amer: 47 mL/min — ABNORMAL LOW (ref >60)
Glucose, Bld: 91 mg/dL (ref 70–99)
Potassium: 3.7 mmol/L (ref 3.5–5.1)
Sodium: 137 mmol/L (ref 135–145)
Total Bilirubin: 1 mg/dL (ref 0.3–1.2)
Total Protein: 7 g/dL (ref 6.5–8.1)

## 2018-09-15 LAB — LIPASE, BLOOD: Lipase: 45 U/L (ref 11–51)

## 2018-09-15 LAB — MAGNESIUM: Magnesium: 1.8 mg/dL (ref 1.7–2.4)

## 2018-09-15 LAB — SURGICAL PCR SCREEN
MRSA, PCR: NEGATIVE
Staphylococcus aureus: NEGATIVE

## 2018-09-15 MED ORDER — CHLORHEXIDINE GLUCONATE CLOTH 2 % EX PADS
6.0000 | MEDICATED_PAD | Freq: Once | CUTANEOUS | Status: AC
  Administered 2018-09-16: 06:00:00 6 via TOPICAL

## 2018-09-15 MED ORDER — SODIUM CHLORIDE 0.9 % IV SOLN
2.0000 g | INTRAVENOUS | Status: DC
  Filled 2018-09-15 (×2): qty 2

## 2018-09-15 MED ORDER — CHLORHEXIDINE GLUCONATE CLOTH 2 % EX PADS
6.0000 | MEDICATED_PAD | Freq: Once | CUTANEOUS | Status: AC
  Administered 2018-09-15: 20:00:00 6 via TOPICAL

## 2018-09-15 MED ORDER — HYDRALAZINE HCL 20 MG/ML IJ SOLN
10.0000 mg | INTRAMUSCULAR | Status: DC | PRN
  Administered 2018-09-16 (×2): 10 mg via INTRAVENOUS
  Filled 2018-09-15 (×2): qty 1

## 2018-09-15 MED ORDER — HYDRALAZINE HCL 20 MG/ML IJ SOLN: 10.0000 mg | INTRAMUSCULAR | Status: DC | PRN

## 2018-09-15 NOTE — Progress Notes (Signed)
PROGRESS NOTE    Lisa [name]  JYN:[id number]  DOB: 08-Oct-1934  DOA: 09/13/2018 PCP: [name], MD   Brief Admission Hx: 84 year old female with accelerated hypertension and COPD presented with acute gallstone pancreatitis and inability to eat associated with nausea and vomiting for the past several days.  MDM/Assessment & Plan:   1. Gallstone pancreatitis-patient improving and tolerating low-fat diet.  Abdominal pain is much better.  Surgery has evaluated her and planning OR 09/16/18. 2. Accelerated hypertension-she has been resumed on all of her home blood pressure medications and given IV hydralazine for additional elevated blood pressure as needed for control. 3. COPD-she has been resumed on her current home regimen.  This appears to be stable and controlled. 4. Age 82 CKD- her renal function is slightly improved overnight. 5. Hypokalemia-repleted. 6. Transaminitis-secondary to gallstone pancreatitis-LFTs trending down.  DVT prophylaxis: SCDs Code Status: Full Family Communication: Patient Disposition Plan: Inpatient   Consultants:  Surgery Dr. Constance Haw  Procedures:  Lap chole planned for 09/16/2018  Antimicrobials:  Ceftriaxone 09/13/2018-09/14/2018  Metronidazole for 320s-09/14/2018  Subjective: The patient is tolerating her diet.  She decided she wants to have a cholecystectomy.  She denies nausea and vomiting.  Her abdominal pain is much improved.  Objective: Vitals:   09/14/18 1929 09/14/18 2218 09/15/18 0549 09/15/18 0900  BP:  (!) 172/71 (!) 159/73   Pulse:  (!) 58 64   Resp:  16 16   Temp:  98.4 F (36.9 C) 98.7 F (37.1 C)   TempSrc:  Oral Oral   SpO2: 95% 96% 96% 96%  Weight:      Height:        Intake/Output Summary (Last 24 hours) at 09/15/2018 0919 Last data filed at 09/14/2018 1830 Gross per 24 hour  Intake -  Output 500 ml  Net -500 ml   Filed Weights   09/13/18 1411 09/13/18 1903  Weight: 49.9 kg 48.8 kg     REVIEW OF SYSTEMS  As  per history otherwise all reviewed and reported negative  Exam:  General exam: Awake, alert, no apparent distress, cooperative and pleasant. Respiratory system: Bilateral breath sounds clear to auscultation. No increased work of breathing. Cardiovascular system: Normal S1 & S2 heard. No JVD, murmurs, gallops, clicks or pedal edema. Gastrointestinal system: Abdomen is nondistended, soft and mild right upper quadrant tenderness with deep palpation. Normal bowel sounds heard. Central nervous system: Alert and oriented. No focal neurological deficits. Extremities: no CCE.  Data Reviewed: Basic Metabolic Panel: Recent Labs  Lab 09/13/18 1413 09/14/18 0609 09/15/18 0627  NA 139 138 137  K 3.4* 3.8 3.7  CL 104 108 108  CO2 25 23 21*  GLUCOSE 129* 88 91  BUN 29* 23 17  CREATININE 1.55* 1.26* 1.10*  CALCIUM 9.1 8.7* 8.8*  MG  --   --  1.8   Liver Function Tests: Recent Labs  Lab 09/13/18 1413 09/14/18 0609 09/15/18 0627  AST 80* 59* 49*  ALT 75* 60* 52*  ALKPHOS 158* 151* 126  BILITOT 2.1* 1.4* 1.0  PROT 7.7 7.0 7.0  ALBUMIN 3.3* 2.9* 3.0*   Recent Labs  Lab 09/13/18 1413 09/14/18 0609 09/15/18 0627  LIPASE 715* 85* 45   No results for input(s): AMMONIA in the last 168 hours. CBC: Recent Labs  Lab 09/13/18 1413 09/14/18 0609 09/15/18 0627  WBC 3.7* 3.1* 4.0  NEUTROABS  --   --  2.3  HGB 11.9* 11.1* 10.8*  HCT 36.7 33.4* 32.9*  MCV 92.0 91.3  91.4  PLT 144* 133* 131*   Cardiac Enzymes: No results for input(s): CKTOTAL, CKMB, CKMBINDEX, TROPONINI in the last 168 hours. CBG (last 3)  No results for input(s): GLUCAP in the last 72 hours. No results found for this or any previous visit (from the past 240 hour(s)).   Studies: Mr Abdomen Mrcp Wo Contrast  Addendum Date: 09/14/2018   ADDENDUM REPORT: 09/14/2018 08:40 ADDENDUM: Subsequent review of this exam shows an additional finding of pancreas divisum. Otherwise, no change to report below. Electronically Signed    By: Earle Gell M.D.   On: 09/14/2018 08:40   Result Date: 09/14/2018 CLINICAL DATA:  Abnormal liver function tests. Cholelithiasis. Pancreatitis. EXAM: MRI ABDOMEN WITHOUT CONTRAST  (INCLUDING MRCP) TECHNIQUE: Multiplanar multisequence MR imaging of the abdomen was performed. Heavily T2-weighted images of the biliary and pancreatic ducts were obtained, and three-dimensional MRCP images were rendered by post processing. COMPARISON:  Ultrasound same day. FINDINGS: Lower chest: Negative Hepatobiliary: Liver parenchyma appears normal. The gallbladder contains multiple gallstones, the largest up to 1.8 cm in diameter. These are position near the gallbladder neck. Biliary tree does not appear abnormally dilated. No filling defect is seen in the common hepatic duct or common bile duct to suggest choledocholithiasis. The intrahepatic ducts are not dilated. Pancreatic duct is not dilated. Pancreas: The pancreatic duct is not dilated. No gross inflammatory change of the pancreas is identified. No evidence of pancreatic mass. Spleen:  Normal Adrenals/Urinary Tract: No adrenal lesion. Both kidneys appear normal. Stomach/Bowel: No gastrointestinal abnormality is identified. Vascular/Lymphatic:  Negative Other:  None Musculoskeletal: Curvature and degenerative change of the spine. IMPRESSION: Cholelithiasis. Largest stone measures 1.8 cm in diameter. Ductal system appears normal. No evidence of choledocholithiasis. No MRCP finding to allow diagnosis of cholecystitis. Pancreas appears normal. Electronically Signed: By: Nelson Chimes M.D. On: 09/13/2018 18:48   Mr 3d Recon At Scanner  Addendum Date: 09/14/2018   ADDENDUM REPORT: 09/14/2018 08:40 ADDENDUM: Subsequent review of this exam shows an additional finding of pancreas divisum. Otherwise, no change to report below. Electronically Signed   By: Earle Gell M.D.   On: 09/14/2018 08:40   Result Date: 09/14/2018 CLINICAL DATA:  Abnormal liver function tests. Cholelithiasis.  Pancreatitis. EXAM: MRI ABDOMEN WITHOUT CONTRAST  (INCLUDING MRCP) TECHNIQUE: Multiplanar multisequence MR imaging of the abdomen was performed. Heavily T2-weighted images of the biliary and pancreatic ducts were obtained, and three-dimensional MRCP images were rendered by post processing. COMPARISON:  Ultrasound same day. FINDINGS: Lower chest: Negative Hepatobiliary: Liver parenchyma appears normal. The gallbladder contains multiple gallstones, the largest up to 1.8 cm in diameter. These are position near the gallbladder neck. Biliary tree does not appear abnormally dilated. No filling defect is seen in the common hepatic duct or common bile duct to suggest choledocholithiasis. The intrahepatic ducts are not dilated. Pancreatic duct is not dilated. Pancreas: The pancreatic duct is not dilated. No gross inflammatory change of the pancreas is identified. No evidence of pancreatic mass. Spleen:  Normal Adrenals/Urinary Tract: No adrenal lesion. Both kidneys appear normal. Stomach/Bowel: No gastrointestinal abnormality is identified. Vascular/Lymphatic:  Negative Other:  None Musculoskeletal: Curvature and degenerative change of the spine. IMPRESSION: Cholelithiasis. Largest stone measures 1.8 cm in diameter. Ductal system appears normal. No evidence of choledocholithiasis. No MRCP finding to allow diagnosis of cholecystitis. Pancreas appears normal. Electronically Signed: By: Nelson Chimes M.D. On: 09/13/2018 18:48   US Abdomen Limited  Result Date: 09/13/2018 CLINICAL DATA:  Epigastric pain and right upper quadrant pain 3 days. EXAM:  ULTRASOUND ABDOMEN LIMITED RIGHT UPPER QUADRANT COMPARISON:  None. FINDINGS: Gallbladder: Evidence of moderate cholelithiasis as the largest stone measures 2 cm. Mild wall thickening as the gallbladder wall measures 5.4 mm. Negative sonographic Murphy sign. No adjacent free fluid. Common bile duct: Diameter: 10.7 mm.  No definite ductal stones. Liver: No focal lesion identified.  Within normal limits in parenchymal echogenicity. Portal vein is patent on color Doppler imaging with normal direction of blood flow towards the liver. IMPRESSION: Moderate cholelithiasis with gallbladder wall thickening. Common bile duct dilatation without choledocholithiasis. Findings may be due to acute cholecystitis. Electronically Signed   By: Marin Olp M.D.   On: 09/13/2018 15:44   Dg Chest Port 1 View  Result Date: 09/14/2018 CLINICAL DATA:  Patient has an upper airway wheeze or/of unknown sound. She shows this when she is short of breath , it goes away when she is not sob. Its similar to and exercise induced audible wheeze. No wheeze in lung fields other than what is transmitted at this time. She does not wear oxygen. She does have a history of smoking.Hx COPD23 year old female with accelerated hypertension and COPD presented to the ED with complaints of ongoing abdominal pain nausea and vomiting and inability to eat for the past several days. EXAM: PORTABLE CHEST 1 VIEW COMPARISON:  05/07/2014 and older exams. FINDINGS: The cardiac silhouette is normal in size. No mediastinal or hilar masses. No evidence of adenopathy. Clear lungs.  No pleural effusion or pneumothorax. Skeletal structures are intact. IMPRESSION: No active disease. Electronically Signed   By: Lajean Manes M.D.   On: 09/14/2018 09:34     Scheduled Meds: . cloNIDine  0.2 mg Oral BID  . diltiazem  120 mg Oral Daily  . heparin  5,000 Units Subcutaneous Q8H  .  HYDROmorphone (DILAUDID) injection  0.5 mg Intravenous Once  . ipratropium-albuterol  3 mL Nebulization Q6H  . labetalol  300 mg Oral BID  . losartan  100 mg Oral Daily   Continuous Infusions: . sodium chloride 70 mL/hr at 09/14/18 2305    Active Problems:   Hypertension   COLD (chronic obstructive lung disease) ([place])   Acute cholecystitis   Gallstone pancreatitis   Calculus of gallbladder without cholecystitis without obstruction  Time spent:   Irwin Brakeman, MD [facility] 09/15/2018, 9:19 AM    LOS: 2 days  How to contact the [facility] Attending or Consulting provider [place] or covering provider during after hours [place], for this patient?  1. Check the care team in [facility] and look for a) attending/consulting [facility] provider listed and b) the [facility] team listed 2. Log into [email or web address] and use [place] universal password to access. If you do not have the password, please contact the hospital operator. 3. Locate the [facility] provider you are looking for under [facility] and page to a number that you can be directly reached. 4. If you still have difficulty reaching the provider, please page the [facility] (Director on Call) for the Hospitalists listed on amion for assistance.

## 2018-09-15 NOTE — Progress Notes (Addendum)
[facility] Progress Note     Subjective: Tolerating fulls fine. Has not had a greasy food. Says pain is better.   Objective: Vital signs in last 24 hours: Temp:  [97.6 F (36.4 C)-98.7 F (37.1 C)] 98.7 F (37.1 C) (04/05 0549) Pulse Rate:  [53-64] 64 (04/05 0549) Resp:  [16] 16 (04/05 0549) BP: (122-229)/(44-77) 159/73 (04/05 0549) SpO2:  [95 %-100 %] 96 % (04/05 0549) Last BM Date: 09/12/18  Intake/Output from previous day: 04/04 0701 - 04/05 0700 In: -  Out: 800 [Urine:800] Intake/Output this shift: No intake/output data recorded.  General appearance: alert, cooperative and no distress Resp: normal work of breathing GI: soft, minimally tender to deep palpation, nondistended   Lab Results:  Recent Labs    09/14/18 0609 09/15/18 0627  WBC 3.1* 4.0  HGB 11.1* 10.8*  HCT 33.4* 32.9*  PLT 133* 131*   BMET Recent Labs    09/14/18 0609 09/15/18 0627  NA 138 137  K 3.8 3.7  CL 108 108  CO2 23 21*  GLUCOSE 88 91  BUN 23 17  CREATININE 1.26* 1.10*  CALCIUM 8.7* 8.8*   PT/INR Recent Labs    09/14/18 0609  LABPROT 12.5  INR 0.9    Studies/Results: Mr Abdomen Mrcp Wo Contrast  Addendum Date: 09/14/2018   ADDENDUM REPORT: 09/14/2018 08:40 ADDENDUM: Subsequent review of this exam shows an additional finding of pancreas divisum. Otherwise, no change to report below. Electronically Signed   By: Earle Gell M.D.   On: 09/14/2018 08:40   Result Date: 09/14/2018 CLINICAL DATA:  Abnormal liver function tests. Cholelithiasis. Pancreatitis. EXAM: MRI ABDOMEN WITHOUT CONTRAST  (INCLUDING MRCP) TECHNIQUE: Multiplanar multisequence MR imaging of the abdomen was performed. Heavily T2-weighted images of the biliary and pancreatic ducts were obtained, and three-dimensional MRCP images were rendered by post processing. COMPARISON:  Ultrasound same day. FINDINGS: Lower chest: Negative Hepatobiliary: Liver parenchyma appears normal. The gallbladder contains  multiple gallstones, the largest up to 1.8 cm in diameter. These are position near the gallbladder neck. Biliary tree does not appear abnormally dilated. No filling defect is seen in the common hepatic duct or common bile duct to suggest choledocholithiasis. The intrahepatic ducts are not dilated. Pancreatic duct is not dilated. Pancreas: The pancreatic duct is not dilated. No gross inflammatory change of the pancreas is identified. No evidence of pancreatic mass. Spleen:  Normal Adrenals/Urinary Tract: No adrenal lesion. Both kidneys appear normal. Stomach/Bowel: No gastrointestinal abnormality is identified. Vascular/Lymphatic:  Negative Other:  None Musculoskeletal: Curvature and degenerative change of the spine. IMPRESSION: Cholelithiasis. Largest stone measures 1.8 cm in diameter. Ductal system appears normal. No evidence of choledocholithiasis. No MRCP finding to allow diagnosis of cholecystitis. Pancreas appears normal. Electronically Signed: By: Nelson Chimes M.D. On: 09/13/2018 18:48   Mr 3d Recon At Scanner  Addendum Date: 09/14/2018   ADDENDUM REPORT: 09/14/2018 08:40 ADDENDUM: Subsequent review of this exam shows an additional finding of pancreas divisum. Otherwise, no change to report below. Electronically Signed   By: Earle Gell M.D.   On: 09/14/2018 08:40   Result Date: 09/14/2018 CLINICAL DATA:  Abnormal liver function tests. Cholelithiasis. Pancreatitis. EXAM: MRI ABDOMEN WITHOUT CONTRAST  (INCLUDING MRCP) TECHNIQUE: Multiplanar multisequence MR imaging of the abdomen was performed. Heavily T2-weighted images of the biliary and pancreatic ducts were obtained, and three-dimensional MRCP images were rendered by post processing. COMPARISON:  Ultrasound same day. FINDINGS: Lower chest: Negative Hepatobiliary: Liver parenchyma appears normal. The gallbladder contains multiple gallstones, the largest  up to 1.8 cm in diameter. These are position near the gallbladder neck. Biliary tree does not appear  abnormally dilated. No filling defect is seen in the common hepatic duct or common bile duct to suggest choledocholithiasis. The intrahepatic ducts are not dilated. Pancreatic duct is not dilated. Pancreas: The pancreatic duct is not dilated. No gross inflammatory change of the pancreas is identified. No evidence of pancreatic mass. Spleen:  Normal Adrenals/Urinary Tract: No adrenal lesion. Both kidneys appear normal. Stomach/Bowel: No gastrointestinal abnormality is identified. Vascular/Lymphatic:  Negative Other:  None Musculoskeletal: Curvature and degenerative change of the spine. IMPRESSION: Cholelithiasis. Largest stone measures 1.8 cm in diameter. Ductal system appears normal. No evidence of choledocholithiasis. No MRCP finding to allow diagnosis of cholecystitis. Pancreas appears normal. Electronically Signed: By: Nelson Chimes M.D. On: 09/13/2018 18:48   US Abdomen Limited  Result Date: 09/13/2018 CLINICAL DATA:  Epigastric pain and right upper quadrant pain 3 days. EXAM: ULTRASOUND ABDOMEN LIMITED RIGHT UPPER QUADRANT COMPARISON:  None. FINDINGS: Gallbladder: Evidence of moderate cholelithiasis as the largest stone measures 2 cm. Mild wall thickening as the gallbladder wall measures 5.4 mm. Negative sonographic Murphy sign. No adjacent free fluid. Common bile duct: Diameter: 10.7 mm.  No definite ductal stones. Liver: No focal lesion identified. Within normal limits in parenchymal echogenicity. Portal vein is patent on color Doppler imaging with normal direction of blood flow towards the liver. IMPRESSION: Moderate cholelithiasis with gallbladder wall thickening. Common bile duct dilatation without choledocholithiasis. Findings may be due to acute cholecystitis. Electronically Signed   By: Marin Olp M.D.   On: 09/13/2018 15:44   Dg Chest Port 1 View  Result Date: 09/14/2018 CLINICAL DATA:  Patient has an upper airway wheeze or/of unknown sound. She shows this when she is short of breath , it goes  away when she is not sob. Its similar to and exercise induced audible wheeze. No wheeze in lung fields other than what is transmitted at this time. She does not wear oxygen. She does have a history of smoking.Hx COPD64 year old female with accelerated hypertension and COPD presented to the ED with complaints of ongoing abdominal pain nausea and vomiting and inability to eat for the past several days. EXAM: PORTABLE CHEST 1 VIEW COMPARISON:  05/07/2014 and older exams. FINDINGS: The cardiac silhouette is normal in size. No mediastinal or hilar masses. No evidence of adenopathy. Clear lungs.  No pleural effusion or pneumothorax. Skeletal structures are intact. IMPRESSION: No active disease. Electronically Signed   By: Lajean Manes M.D.   On: 09/14/2018 09:34    Anti-infectives: Anti-infectives (From admission, onward)   Start     Dose/Rate Route Frequency Ordered Stop   09/14/18 1700  cefTRIAXone (ROCEPHIN) 1 g in sodium chloride 0.9 % 100 mL IVPB  Status:  Discontinued     1 g 200 mL/hr over 30 Minutes Intravenous Every 24 hours 09/13/18 1658 09/13/18 1723   09/14/18 1700  cefTRIAXone (ROCEPHIN) 2 g in sodium chloride 0.9 % 100 mL IVPB  Status:  Discontinued     2 g 200 mL/hr over 30 Minutes Intravenous Every 24 hours 09/13/18 1723 09/14/18 1237   09/13/18 1700  metroNIDAZOLE (FLAGYL) IVPB 500 mg  Status:  Discontinued     500 mg 100 mL/hr over 60 Minutes Intravenous Every 8 hours 09/13/18 1658 09/14/18 1237   09/13/18 1600  cefTRIAXone (ROCEPHIN) 1 g in sodium chloride 0.9 % 100 mL IVPB     1 g 200 mL/hr over 30 Minutes Intravenous  Once 09/13/18 1552 09/13/18 1643      Assessment/Plan: Ms. Rosana Hoes is a 84 yo with gallstone pancreatitis who is doing fair. She has a history of COPD and HTN at baseline. She is feeling better. We discussed the likely recurrence of gallstone pancreatitis as high as 30-40% in the upcoming 4-6 weeks. We discussed COVID 19 and the risk of the infection in a COPD  patient, the risk of having to return to the hospital, and goal of keeping her healthy in the upcoming weeks. She wants to proceed with the surgery this admission given the pain and the risk of having to come back to the hospital in the middle of the peak of COVID.   -PLAN: I counseled the patient about the indication, risks and benefits of laparoscopic cholecystectomy.  She understands there is a very small chance for bleeding, infection, injury to normal structures (including common bile duct), conversion to open surgery, persistent symptoms, evolution of postcholecystectomy diarrhea, need for secondary interventions, anesthesia reaction, cardiopulmonary issues and other risks not specifically detailed here. I described the expected recovery, the plan for follow-up and the restrictions during the recovery phase.  All questions were answered. -OR tomorrow for Lap cholecystectomy  EKG with sinus rhythm BP better controlled, Dr. Wynetta Emery working on it CXR without signs of active disease  Preop orders placed  Spoke with Gwendalyn Ege the patient's brother, she requested that I call him. Discussed the patient's condition and plan for surgery tomorrow. [phone]  We have discussed the recent COVID 19 infections and risk associated with being in the hospital. We have discussed that purely elective surgeries are being canceled due to this infection, but that urgent and emergent cases are being performed on a case to case basis. The patient currently needs a laparoscopic cholecystectomy and this falls under an urgent case due to gallstone pancreatitis and the likelihood that it will recur in the upcoming weeks, bring the patient back to the hospital and possibly develop a worse case of pancreatitis.       LOS: 2 days    Virl Cagey 09/15/2018

## 2018-09-16 ENCOUNTER — Inpatient Hospital Stay (HOSPITAL_COMMUNITY): Payer: Medicare Other

## 2018-09-16 ENCOUNTER — Inpatient Hospital Stay (HOSPITAL_COMMUNITY): Payer: Medicare Other | Admitting: Anesthesiology

## 2018-09-16 ENCOUNTER — Encounter (HOSPITAL_COMMUNITY)
Admission: EM | Disposition: A | Discharge status: Home / Self Care | Payer: Self-pay | Source: Home / Self Care | Attending: Family Medicine

## 2018-09-16 ENCOUNTER — Other Ambulatory Visit: Payer: Self-pay

## 2018-09-16 ENCOUNTER — Encounter (HOSPITAL_COMMUNITY): Payer: Self-pay

## 2018-09-16 LAB — COMPREHENSIVE METABOLIC PANEL
ALT: 47 U/L — ABNORMAL HIGH (ref 0–44)
AST: 49 U/L — ABNORMAL HIGH (ref 15–41)
Albumin: 3 g/dL — ABNORMAL LOW (ref 3.5–5.0)
Alkaline Phosphatase: 119 U/L (ref 38–126)
Anion gap: 8 (ref 5–15)
BUN: 15 mg/dL (ref 8–23)
CO2: 23 mmol/L (ref 22–32)
Calcium: 9.2 mg/dL (ref 8.9–10.3)
Chloride: 109 mmol/L (ref 98–111)
Creatinine, Ser: 1.08 mg/dL — ABNORMAL HIGH (ref 0.44–1.00)
GFR calc Af Amer: 56 mL/min — ABNORMAL LOW (ref >60)
GFR calc non Af Amer: 48 mL/min — ABNORMAL LOW (ref >60)
Glucose, Bld: 93 mg/dL (ref 70–99)
Potassium: 4 mmol/L (ref 3.5–5.1)
Sodium: 140 mmol/L (ref 135–145)
Total Bilirubin: 1 mg/dL (ref 0.3–1.2)
Total Protein: 7.2 g/dL (ref 6.5–8.1)

## 2018-09-16 LAB — CBC
HCT: 33.1 % — ABNORMAL LOW (ref 36.0–46.0)
Hemoglobin: 10.8 g/dL — ABNORMAL LOW (ref 12.0–15.0)
MCH: 30.1 pg (ref 26.0–34.0)
MCHC: 32.6 g/dL (ref 30.0–36.0)
MCV: 92.2 fL (ref 80.0–100.0)
Platelets: 106 10*3/uL — ABNORMAL LOW (ref 150–400)
RBC: 3.59 MIL/uL — ABNORMAL LOW (ref 3.87–5.11)
RDW: 13.2 % (ref 11.5–15.5)
WBC: 4 10*3/uL (ref 4.0–10.5)
nRBC: 0 % (ref 0.0–0.2)

## 2018-09-16 SURGERY — LAPAROSCOPIC CHOLECYSTECTOMY
Anesthesia: General

## 2018-09-16 MED ORDER — BUPIVACAINE HCL (PF) 0.5 % IJ SOLN
INTRAMUSCULAR | Status: AC
  Filled 2018-09-16: qty 30

## 2018-09-16 MED ORDER — IOHEXOL 300 MG/ML  SOLN
75.0000 mL | Freq: Once | INTRAMUSCULAR | Status: AC | PRN
  Administered 2018-09-16: 75 mL via INTRAVENOUS

## 2018-09-16 MED ORDER — ADULT MULTIVITAMIN W/MINERALS CH
1.0000 | ORAL_TABLET | Freq: Every day | ORAL | Status: DC
  Filled 2018-09-16: qty 1

## 2018-09-16 MED ORDER — BOOST / RESOURCE BREEZE PO LIQD CUSTOM
1.0000 | Freq: Three times a day (TID) | ORAL | Status: DC
  Administered 2018-09-17: 1 via ORAL

## 2018-09-16 MED ORDER — IPRATROPIUM-ALBUTEROL 0.5-2.5 (3) MG/3ML IN SOLN
3.0000 mL | Freq: Three times a day (TID) | RESPIRATORY_TRACT | Status: DC
  Administered 2018-09-17 (×2): 3 mL via RESPIRATORY_TRACT
  Filled 2018-09-16 (×2): qty 3

## 2018-09-16 NOTE — Progress Notes (Signed)
Initial Nutrition Assessment  RD working remotely.  DOCUMENTATION CODES:   Underweight  INTERVENTION:   -Boost Breeze po TID, each supplement provides 250 kcal and 9 grams of protein (to start 09/17/18) -MVI with minerals daily (to start 09/17/18) -RD will follow for diet advancement and adjust supplement regimen as appropriate  NUTRITION DIAGNOSIS:   Inadequate oral intake related to altered GI function as evidenced by NPO status.  GOAL:   Patient will meet greater than or equal to 90% of their needs  MONITOR:   PO intake, Supplement acceptance, Diet advancement, Labs, Weight trends, Skin, I & O's  REASON FOR ASSESSMENT:   Other (Comment)    ASSESSMENT:    Lisa [name]  is a 84 y.o. female, with past medical history of hypertension, COPD, she presents to ED secondary to complaints of abdominal pain, nausea and vomiting, reports symptoms started on Monday, reports she did have a milkshake, symptoms after that stopped secondary to lactose intolerance, ports abdominal pain is sharp, intermittent, provoked by eating, no relieving factors, 5/10 intensity, compounded by nausea, vomiting, mostly after greasy food, has any chest pain, shortness of breath(other than her baseline from COPD), no fever, no chills, no sick contact, recent travel.  Pt admitted with gallstone pancreatitis/ acute cholecystitis.   Reviewed I/O's: +1.7 L x 24 hours and +1.4 L since admission  UOP: 2.4 L x 24 hours  Pt NPO for scheduled lap chole today.   RD attempted to speak with pt by phone, however, unable to reach.   Per H&P, pt has been experiencing abdominal pain, nausea, and vomiting since last week. Pain is provoked by eating, especially after consuming greasy food. Given pt's underweight status, suspect chronic decreased oral intake, however, chart with very limited information in regarding to oral intake and recent wt trends.   Suspect pt with malnutrition, however, unable to identify without  further diet and weight history.   RD would greatly benefit from addition of nutritional supplements post-op, which RD will order when diet advances.   Labs reviewed.   NUTRITION - FOCUSED PHYSICAL EXAM:    Most Recent Value  Orbital Region  Unable to assess  Upper Arm Region  Unable to assess  Thoracic and Lumbar Region  Unable to assess  Buccal Region  Unable to assess  Temple Region  Unable to assess  Clavicle Bone Region  Unable to assess  Clavicle and Acromion Bone Region  Unable to assess  Scapular Bone Region  Unable to assess  Dorsal Hand  Unable to assess  Patellar Region  Unable to assess  Anterior Thigh Region  Unable to assess  Posterior Calf Region  Unable to assess  Edema (RD Assessment)  Unable to assess  Hair  Unable to assess  Eyes  Unable to assess  Mouth  Unable to assess  Skin  Unable to assess  Nails  Unable to assess       Diet Order:   Diet Order            Diet NPO time specified  Diet effective midnight              EDUCATION NEEDS:   No education needs have been identified at this time  Skin:  Skin Assessment: Reviewed RN Assessment  Last BM:  09/12/18  Height:   Ht Readings from Last 1 Encounters:  09/13/18 5\' 7"  (1.702 m)    Weight:   Wt Readings from Last 1 Encounters:  09/13/18 48.8 kg  Ideal Body Weight:  61.4 kg  BMI:  Body mass index is 16.84 kg/m.  Estimated Nutritional Needs:   Kcal:  1500-1700  Protein:  75-90 grams  Fluid:  > 1.5 L   [name] A. Jimmye Norman, RD, LDN, [place] Registered Dietitian II Certified Diabetes Care and Education Specialist Pager: [phone] After hours Pager: [phone]

## 2018-09-16 NOTE — OR Nursing (Signed)
Patient took to CT via her bed. Procedure for OR moved to tomorrow  Pending result of CT Scan.

## 2018-09-16 NOTE — Anesthesia Preprocedure Evaluation (Addendum)
Anesthesia Evaluation  Patient identified by MRN, date of birth, ID band Patient awake    Reviewed: Allergy & Precautions, NPO status , Patient's Chart, lab work & pertinent test results  Airway Mallampati: II  TM Distance: >3 FB Neck ROM: Full   Comment: Large neck mass appreciated Denies dysphagia Dental  (+) Teeth Intact   Pulmonary COPD,  COPD inhaler, former smoker,           Cardiovascular Exercise Tolerance: Good hypertension, Pt. on medications and Pt. on home beta blockers negative cardio ROS  I  Uses walker for balance    Neuro/Psych negative neurological ROS  negative psych ROS   GI/Hepatic negative GI ROS, Neg liver ROS,   Endo/Other  negative endocrine ROSPt with very prominent neck  ? Thyroid   Renal/GU Renal InsufficiencyRenal disease  negative genitourinary   Musculoskeletal negative musculoskeletal ROS (+)   Abdominal   Peds negative pediatric ROS (+)  Hematology negative hematology ROS (+)   Anesthesia Other Findings   Reproductive/Obstetrics negative OB ROS                            Anesthesia Physical Anesthesia Plan  ASA: III and emergent  Anesthesia Plan: General   Post-op Pain Management:    Induction: Intravenous  PONV Risk Score and Plan:   Airway Management Planned: Oral ETT  Additional Equipment:   Intra-op Plan:   Post-operative Plan: Extubation in OR  Informed Consent: I have reviewed the patients History and Physical, chart, labs and discussed the procedure including the risks, benefits and alternatives for the proposed anesthesia with the patient or authorized representative who has indicated his/her understanding and acceptance.     Dental advisory given  Plan Discussed with: CRNA  Anesthesia Plan Comments: (Plan full PPE use  Case deemed appropriate by dr. Constance Haw in light of current Covid concerns )        Anesthesia Quick  Evaluation

## 2018-09-16 NOTE — Interval H&P Note (Signed)
History and Physical Interval Note:  09/16/2018 9:22 AM  Lisa [name]  has presented today for surgery, with the diagnosis of gallstone pancreatitis.  The various methods of treatment have been discussed with the patient and family. After consideration of risks, benefits and other options for treatment, the patient has consented to  Procedure(s): LAPAROSCOPIC CHOLECYSTECTOMY (N/A) as a surgical intervention.  The patient's history has been reviewed, patient examined, no change in status, stable for surgery.  I have reviewed the patient's chart and labs.  Questions were answered to the patient's satisfaction.    We have discussed the recent COVID 19 infections and risk associated with being in the hospital. We have discussed that purely elective surgeries are being canceled due to this infection, but that urgent and emergent cases are being performed on a case to case basis. The patient currently needs a laparoscopic cholecystectomy and this falls under an urgent case due to the risk of recurrence of gallstone pancreatitis and the risk of COVID peak in the upcoming weeks coinciding with a recurrence.      Virl Cagey

## 2018-09-16 NOTE — Progress Notes (Signed)
[facility]  Trachea without any deviation or compression.  Incidental findings of lung nodule on Ct and left carotid stenosis.  Patient without any stroke symptoms and denies any weakness, amaurosis fugax, or other stroke symptom.    Will got ahead a order the carotid US given risk of stroke post op to ensure no issues. Will have her PCP work up the lung nodule as an outpatient. Patient aware.   Will need to continue to be followed for her thyroid and she reports seeing someone last year about it (not in our records).   Plan for lap chole tomorrow.  Curlene Labrum, MD [facility] [address] [place], [place] [address] [phone] (office)

## 2018-09-16 NOTE — Progress Notes (Addendum)
PROGRESS NOTE    Lisa [name]  XWR:[id number] DOB: February 05, 1935 DOA: 09/13/2018 PCP: [name], MD    Brief Narrative:  84 year old female who presented with abdominal pain.  She does have significant past medical history for hypertension and COPD.  She reported 4 days of abdominal pain, nausea and vomiting.  On her initial physical examination blood pressure 199/64, heart rate 56, temperature 97.6, respiratory rate 16, oxygen saturation 99%.  Her lungs were clear to auscultation, heart S1-S2 present rhythm, abdomen was soft, tender in the right upper quadrant, no lower extremity edema.  Sodium 139, potassium 3.4, chloride 104, bicarb 25, glucose 129, BUN 29, creatinine 1.55, lipase 715, AST 80, ALT 75, white count 3.7, hemoglobin 9.9, hematocrit 36.7, platelets 144. Abdominal ultrasonography with moderate cholelithiasis with gallbladder wall thickening.  Common bile duct dilatation without choledocholithiasis.  Findings consistent with acute cholecystitis.  MRCP with cholelithiasis, largest stone measuring 1.8 cm diameter, ductal system appeared normal.  No evidence of choledocholithiasis. Chest film with hyperinflation but no infiltrates. EKG sinus rhythm, 57 bpm, normal axis, normal intervals, no ST segment changes, T wave inversion II, III and aVF, plus V4 through V6. Positive LVH. (chronic changes).  Patient was admitted to the hospital with the working diagnosis of gallstone pancreatitis.    Assessment & Plan:   Active Problems:   Hypertension   COLD (chronic obstructive lung disease) (HCC)   Acute cholecystitis   Gallstone pancreatitis   Calculus of gallbladder without cholecystitis without obstruction   Abnormal LFTs (liver function tests)   1. Acute gallstone pancreatitis. Patient has been NPO past midnight, scheduled for cholecystectomy this am. Pain is well controlled, no nausea or vomiting. Will follow post op recommendations from surgery.   2. Uncontrolled HTN.  Continue  blood pressure control with clonidine, diltiazem, losartan and labetalol. Blood pressure continue to be elevated. This am at 196 to 191 mmHg systolic.   3. COPD. No signs of acute exacerbation, will continue oxymetry monitoring and bronchodilator therapy.   4. CKD stage 4. Renal function stable with serum creatinine at 1,08 with K at 4,0 and serum bicarbonate at 23. Will follow on renal panel in am.   5. Anxiety. Will continue as needed lorazepam.   6. Calorie protein malnutrition (underweight). Calculated BMI is 16,8, will continue with nutritional supplements.   DVT prophylaxis: scd   Code Status:  full Family Communication: no family at the bedside  Disposition Plan/ discharge barriers: pending surgical intervention.   Body mass index is 16.84 kg/m. Malnutrition Type:      Malnutrition Characteristics:      Nutrition Interventions:     RN Pressure Injury Documentation:     Consultants:   Surgery   Procedures:     Antimicrobials:       Subjective: Patient is feeling better this am, has been npo since midnight, no nausea or vomiting, no chest pain or dyspnea.   Objective: Vitals:   09/15/18 2344 09/15/18 2347 09/16/18 0558 09/16/18 0825  BP: (!) 125/51 (!) 152/61 (!) 196/64   Pulse:   (!) 53   Resp:   16   Temp:   98.8 F (37.1 C)   TempSrc:   Oral   SpO2:   97% 98%  Weight:      Height:        Intake/Output Summary (Last 24 hours) at 09/16/2018 0830 Last data filed at 09/16/2018 0542 Gross per 24 hour  Intake 4106.19 ml  Output 2400 ml  Net 1706.19  ml   Filed Weights   09/13/18 1411 09/13/18 1903  Weight: 49.9 kg 48.8 kg    Examination:   General: Not in pain or dyspnea.  Neurology: Awake and alert, non focal  E ENT: mild pallor, no icterus, oral mucosa moist Cardiovascular: No JVD. S1-S2 present, rhythmic, no gallops, rubs, or murmurs. No lower extremity edema. Pulmonary: positive breath sounds bilaterally, positive expiratory  wheezing, but no significant rhonchi or rales. Gastrointestinal. Abdomen with no organomegaly, non tender to palpation with no rebound or guarding Skin. No rashes Musculoskeletal: no joint deformities     Data Reviewed: I have personally reviewed following labs and imaging studies  CBC: Recent Labs  Lab 09/13/18 1413 09/14/18 0609 09/15/18 0627 09/16/18 0424  WBC 3.7* 3.1* 4.0 4.0  NEUTROABS  --   --  2.3  --   HGB 11.9* 11.1* 10.8* 10.8*  HCT 36.7 33.4* 32.9* 33.1*  MCV 92.0 91.3 91.4 92.2  PLT 144* 133* 131* 341*   Basic Metabolic Panel: Recent Labs  Lab 09/13/18 1413 09/14/18 0609 09/15/18 0627 09/16/18 0424  NA 139 138 137 140  K 3.4* 3.8 3.7 4.0  CL 104 108 108 109  CO2 25 23 21* 23  GLUCOSE 129* 88 91 93  BUN 29* 23 17 15   CREATININE 1.55* 1.26* 1.10* 1.08*  CALCIUM 9.1 8.7* 8.8* 9.2  MG  --   --  1.8  --    GFR: Estimated Creatinine Clearance: 31.5 mL/min (A) (by C-G formula based on SCr of 1.08 mg/dL (H)). Liver Function Tests: Recent Labs  Lab 09/13/18 1413 09/14/18 0609 09/15/18 0627 09/16/18 0424  AST 80* 59* 49* 49*  ALT 75* 60* 52* 47*  ALKPHOS 158* 151* 126 119  BILITOT 2.1* 1.4* 1.0 1.0  PROT 7.7 7.0 7.0 7.2  ALBUMIN 3.3* 2.9* 3.0* 3.0*   Recent Labs  Lab 09/13/18 1413 09/14/18 0609 09/15/18 0627  LIPASE 715* 85* 45   No results for input(s): AMMONIA in the last 168 hours. Coagulation Profile: Recent Labs  Lab 09/14/18 0609  INR 0.9   Cardiac Enzymes: No results for input(s): CKTOTAL, CKMB, CKMBINDEX, TROPONINI in the last 168 hours. BNP (last 3 results) No results for input(s): PROBNP in the last 8760 hours. HbA1C: No results for input(s): HGBA1C in the last 72 hours. CBG: No results for input(s): GLUCAP in the last 168 hours. Lipid Profile: Recent Labs    09/14/18 0609  CHOL 166  HDL 59  LDLCALC 87  TRIG 102  CHOLHDL 2.8   Thyroid Function Tests: No results for input(s): TSH, T4TOTAL, FREET4, T3FREE, THYROIDAB  in the last 72 hours. Anemia Panel: No results for input(s): VITAMINB12, FOLATE, FERRITIN, TIBC, IRON, RETICCTPCT in the last 72 hours.    Radiology Studies: I have reviewed all of the imaging during this hospital visit personally     Scheduled Meds: . cloNIDine  0.2 mg Oral BID  . diltiazem  120 mg Oral Daily  . heparin  5,000 Units Subcutaneous Q8H  . ipratropium-albuterol  3 mL Nebulization Q6H  . labetalol  300 mg Oral BID  . losartan  100 mg Oral Daily   Continuous Infusions: . sodium chloride 30 mL/hr at 09/15/18 1754  . cefoTEtan (CEFOTAN) IV       LOS: 3 days        Mauricio Gerome Apley, MD

## 2018-09-16 NOTE — Progress Notes (Signed)
[facility]  Patient with large thyromegaly. We noticed this once she was being evaluated for her airway.  Discussed with Dr. Hilaria Ota and we want to proceed with a CT neck to evaluate and assess if the trachea is deviated in anyway.  Plan for OR tomorrow pending the CT results.  NPO midnight. Patient aware and in agreement. We do not want to proceed with surgery without further assessing her airway.   Notified patient's brother, Juanda Crumble regarding the plan. Notified Dr. Cathlean Sauer.   Curlene Labrum, MD [facility] [address] [place], [place] [address] [phone] (office)

## 2018-09-17 ENCOUNTER — Inpatient Hospital Stay (HOSPITAL_COMMUNITY): Payer: Medicare Other | Admitting: Anesthesiology

## 2018-09-17 ENCOUNTER — Encounter (HOSPITAL_COMMUNITY)
Admission: EM | Disposition: A | Discharge status: Home / Self Care | Payer: Self-pay | Source: Home / Self Care | Attending: Family Medicine

## 2018-09-17 HISTORY — PX: CHOLECYSTECTOMY: SHX55

## 2018-09-17 LAB — BASIC METABOLIC PANEL
Anion gap: 7 (ref 5–15)
BUN: 14 mg/dL (ref 8–23)
CO2: 23 mmol/L (ref 22–32)
Calcium: 8.8 mg/dL — ABNORMAL LOW (ref 8.9–10.3)
Chloride: 109 mmol/L (ref 98–111)
Creatinine, Ser: 1.12 mg/dL — ABNORMAL HIGH (ref 0.44–1.00)
GFR calc Af Amer: 53 mL/min — ABNORMAL LOW (ref >60)
GFR calc non Af Amer: 46 mL/min — ABNORMAL LOW (ref >60)
Glucose, Bld: 103 mg/dL — ABNORMAL HIGH (ref 70–99)
Potassium: 3.4 mmol/L — ABNORMAL LOW (ref 3.5–5.1)
Sodium: 139 mmol/L (ref 135–145)

## 2018-09-17 LAB — CBC WITH DIFFERENTIAL/PLATELET
Abs Immature Granulocytes: 0.02 10*3/uL (ref 0.00–0.07)
Basophils Absolute: 0 10*3/uL (ref 0.0–0.1)
Basophils Relative: 0 %
Eosinophils Absolute: 0.1 10*3/uL (ref 0.0–0.5)
Eosinophils Relative: 1 %
HCT: 32.9 % — ABNORMAL LOW (ref 36.0–46.0)
Hemoglobin: 10.6 g/dL — ABNORMAL LOW (ref 12.0–15.0)
Immature Granulocytes: 1 %
Lymphocytes Relative: 34 %
Lymphs Abs: 1.4 10*3/uL (ref 0.7–4.0)
MCH: 29.7 pg (ref 26.0–34.0)
MCHC: 32.2 g/dL (ref 30.0–36.0)
MCV: 92.2 fL (ref 80.0–100.0)
Monocytes Absolute: 0.5 10*3/uL (ref 0.1–1.0)
Monocytes Relative: 12 %
Neutro Abs: 2.2 10*3/uL (ref 1.7–7.7)
Neutrophils Relative %: 52 %
Platelets: 135 10*3/uL — ABNORMAL LOW (ref 150–400)
RBC: 3.57 MIL/uL — ABNORMAL LOW (ref 3.87–5.11)
RDW: 13.1 % (ref 11.5–15.5)
WBC: 4.2 10*3/uL (ref 4.0–10.5)
nRBC: 0 % (ref 0.0–0.2)

## 2018-09-17 SURGERY — LAPAROSCOPIC CHOLECYSTECTOMY
Anesthesia: General

## 2018-09-17 MED ORDER — HYDROMORPHONE HCL 1 MG/ML IJ SOLN: 0.2500 mg | INTRAMUSCULAR | Status: DC | PRN

## 2018-09-17 MED ORDER — PROMETHAZINE HCL 25 MG/ML IJ SOLN: 6.2500 mg | INTRAMUSCULAR | Status: DC | PRN

## 2018-09-17 MED ORDER — ONDANSETRON HCL 4 MG/2ML IJ SOLN
INTRAMUSCULAR | Status: AC
  Filled 2018-09-17: qty 2

## 2018-09-17 MED ORDER — SUGAMMADEX SODIUM 500 MG/5ML IV SOLN
INTRAVENOUS | Status: DC | PRN
  Administered 2018-09-17: 100 mg via INTRAVENOUS

## 2018-09-17 MED ORDER — SODIUM CHLORIDE 0.9 % IR SOLN
Status: DC | PRN
  Administered 2018-09-17: 1000 mL

## 2018-09-17 MED ORDER — HYDRALAZINE HCL 20 MG/ML IJ SOLN
INTRAMUSCULAR | Status: DC | PRN
  Administered 2018-09-17: 4 mg via INTRAVENOUS

## 2018-09-17 MED ORDER — LACTATED RINGERS IV SOLN
INTRAVENOUS | Status: DC
  Administered 2018-09-17: 09:00:00 via INTRAVENOUS

## 2018-09-17 MED ORDER — PROPOFOL 10 MG/ML IV BOLUS
INTRAVENOUS | Status: DC | PRN
  Administered 2018-09-17: 100 mg via INTRAVENOUS

## 2018-09-17 MED ORDER — HYDROCODONE-ACETAMINOPHEN 7.5-325 MG PO TABS: 1.0000 | ORAL_TABLET | Freq: Once | ORAL | Status: DC | PRN

## 2018-09-17 MED ORDER — MORPHINE SULFATE (PF) 2 MG/ML IV SOLN
2.0000 mg | INTRAVENOUS | Status: DC | PRN
  Administered 2018-09-17: 13:00:00 2 mg via INTRAVENOUS
  Filled 2018-09-17: qty 1

## 2018-09-17 MED ORDER — MIDAZOLAM HCL 2 MG/2ML IJ SOLN: 0.5000 mg | Freq: Once | INTRAMUSCULAR | Status: DC | PRN

## 2018-09-17 MED ORDER — SODIUM CHLORIDE 0.9 % IV SOLN
INTRAVENOUS | Status: AC
  Filled 2018-09-17: qty 2

## 2018-09-17 MED ORDER — BUPIVACAINE HCL (PF) 0.5 % IJ SOLN
INTRAMUSCULAR | Status: AC
  Filled 2018-09-17: qty 30

## 2018-09-17 MED ORDER — BUPIVACAINE HCL (PF) 0.5 % IJ SOLN
INTRAMUSCULAR | Status: DC | PRN
  Administered 2018-09-17: 10 mL

## 2018-09-17 MED ORDER — SODIUM CHLORIDE 0.9 % IV SOLN
2.0000 g | Freq: Once | INTRAVENOUS | Status: AC
  Administered 2018-09-17: 2 g via INTRAVENOUS

## 2018-09-17 MED ORDER — SUFENTANIL CITRATE 50 MCG/ML IV SOLN
INTRAVENOUS | Status: DC | PRN
  Administered 2018-09-17 (×2): 5 ug via INTRAVENOUS

## 2018-09-17 MED ORDER — PROPOFOL 10 MG/ML IV BOLUS
INTRAVENOUS | Status: AC
  Filled 2018-09-17: qty 40

## 2018-09-17 MED ORDER — HEMOSTATIC AGENTS (NO CHARGE) OPTIME
TOPICAL | Status: DC | PRN
  Administered 2018-09-17: 1 via TOPICAL

## 2018-09-17 MED ORDER — ROCURONIUM BROMIDE 100 MG/10ML IV SOLN
INTRAVENOUS | Status: DC | PRN
  Administered 2018-09-17: 30 mg via INTRAVENOUS

## 2018-09-17 MED ORDER — OXYCODONE HCL 5 MG PO TABS: 5.0000 mg | ORAL_TABLET | Freq: Four times a day (QID) | ORAL | 0 refills | Status: DC | PRN

## 2018-09-17 MED ORDER — ONDANSETRON HCL 4 MG/2ML IJ SOLN
INTRAMUSCULAR | Status: DC | PRN
  Administered 2018-09-17: 4 mg via INTRAVENOUS

## 2018-09-17 MED ORDER — SUFENTANIL CITRATE 50 MCG/ML IV SOLN
INTRAVENOUS | Status: AC
  Filled 2018-09-17: qty 1

## 2018-09-17 MED ORDER — LACTATED RINGERS IV SOLN: INTRAVENOUS | Status: DC

## 2018-09-17 MED ORDER — SUCCINYLCHOLINE CHLORIDE 20 MG/ML IJ SOLN
INTRAMUSCULAR | Status: DC | PRN
  Administered 2018-09-17: 120 mg via INTRAVENOUS

## 2018-09-17 SURGICAL SUPPLY — 47 items
ADH SKN CLS APL DERMABOND .7 (GAUZE/BANDAGES/DRESSINGS) ×1
APPLIER CLIP ROT 10 11.4 M/L (STAPLE) ×3
APR CLP MED LRG 11.4X10 (STAPLE) ×1
BAG RETRIEVAL 10 (BASKET) ×1
BAG RETRIEVAL 10MM (BASKET) ×1
BLADE SURG 15 STRL LF DISP TIS (BLADE) ×1 IMPLANT
BLADE SURG 15 STRL SS (BLADE) ×3
CHLORAPREP W/TINT 26ML (MISCELLANEOUS) ×3 IMPLANT
CLIP APPLIE ROT 10 11.4 M/L (STAPLE) ×1 IMPLANT
CLOTH BEACON ORANGE TIMEOUT ST (SAFETY) ×3 IMPLANT
COVER LIGHT HANDLE STERIS (MISCELLANEOUS) ×6 IMPLANT
COVER WAND RF STERILE (DRAPES) ×2 IMPLANT
DECANTER SPIKE VIAL GLASS SM (MISCELLANEOUS) ×3 IMPLANT
DERMABOND ADVANCED (GAUZE/BANDAGES/DRESSINGS) ×2
DERMABOND ADVANCED .7 DNX12 (GAUZE/BANDAGES/DRESSINGS) ×1 IMPLANT
ELECT REM PT RETURN 9FT ADLT (ELECTROSURGICAL) ×3
ELECTRODE REM PT RTRN 9FT ADLT (ELECTROSURGICAL) ×1 IMPLANT
FILTER SMOKE EVAC LAPAROSHD (FILTER) ×3 IMPLANT
GLOVE BIO SURGEON STRL SZ 6.5 (GLOVE) ×2 IMPLANT
GLOVE BIO SURGEONS STRL SZ 6.5 (GLOVE) ×1
GLOVE BIOGEL PI IND STRL 6.5 (GLOVE) ×1 IMPLANT
GLOVE BIOGEL PI IND STRL 7.0 (GLOVE) ×3 IMPLANT
GLOVE BIOGEL PI INDICATOR 6.5 (GLOVE) ×2
GLOVE BIOGEL PI INDICATOR 7.0 (GLOVE) ×6
GOWN STRL REUS W/TWL LRG LVL3 (GOWN DISPOSABLE) ×9 IMPLANT
HEMOSTAT SNOW SURGICEL 2X4 (HEMOSTASIS) ×3 IMPLANT
INST SET LAPROSCOPIC AP (KITS) ×3 IMPLANT
KIT TURNOVER KIT A (KITS) ×3 IMPLANT
MANIFOLD NEPTUNE II (INSTRUMENTS) ×3 IMPLANT
NDL INSUFFLATION 14GA 120MM (NEEDLE) ×1 IMPLANT
NEEDLE INSUFFLATION 14GA 120MM (NEEDLE) ×3 IMPLANT
NS IRRIG 1000ML POUR BTL (IV SOLUTION) ×3 IMPLANT
PACK LAP CHOLE LZT030E (CUSTOM PROCEDURE TRAY) ×3 IMPLANT
PAD ARMBOARD 7.5X6 YLW CONV (MISCELLANEOUS) ×3 IMPLANT
SET BASIN LINEN APH (SET/KITS/TRAYS/PACK) ×3 IMPLANT
SLEEVE ENDOPATH XCEL 5M (ENDOMECHANICALS) ×3 IMPLANT
SUT MNCRL AB 4-0 PS2 18 (SUTURE) ×3 IMPLANT
SUT VICRYL 0 UR6 27IN ABS (SUTURE) ×3 IMPLANT
SYS BAG RETRIEVAL 10MM (BASKET) ×1
SYSTEM BAG RETRIEVAL 10MM (BASKET) ×1 IMPLANT
TROCAR ENDO BLADELESS 11MM (ENDOMECHANICALS) ×3 IMPLANT
TROCAR XCEL NON-BLD 5MMX100MML (ENDOMECHANICALS) ×3 IMPLANT
TROCAR XCEL UNIV SLVE 11M 100M (ENDOMECHANICALS) ×3 IMPLANT
TUBE CONNECTING 12'X1/4 (SUCTIONS) ×1
TUBE CONNECTING 12X1/4 (SUCTIONS) ×2 IMPLANT
TUBING INSUFFLATION (TUBING) ×3 IMPLANT
WARMER LAPAROSCOPE (MISCELLANEOUS) ×3 IMPLANT

## 2018-09-17 NOTE — H&P (View-Only) (Signed)
[facility]  CT reviewed yesterday. Dr. Hilaria Ota reviewed read, report of some mild subglottic narrowing and he is a little concerned. He wants it reviewed.   The trachea on the sagittals and coronals looks wide with maybe mild narrowing.   Called Radiologist, Dr. Pascal Lux and he agrees mild narrowing if anything. He says there should be no reason for any issues with intubation. He does not think a smaller ET tube will even be needed. He is going to put measurements on the CT reading.  His help is much appreciated.  Curlene Labrum, MD [facility] [address] [place], [place] [address] [phone] (office)

## 2018-09-17 NOTE — Anesthesia Preprocedure Evaluation (Addendum)
Anesthesia Evaluation  Patient identified by MRN, date of birth, ID band Patient awake    Reviewed: Allergy & Precautions, NPO status , Patient's Chart, lab work & pertinent test results, reviewed documented beta blocker date and time   Airway Mallampati: II  TM Distance: >3 FB Neck ROM: Full   Comment: Large neck secondary to thyroid goiter, no current dyspnea  Dental no notable dental hx. (+) Teeth Intact   Pulmonary COPD,  COPD inhaler, former smoker,    Pulmonary exam normal breath sounds clear to auscultation       Cardiovascular Exercise Tolerance: Good hypertension, Pt. on medications negative cardio ROS Normal cardiovascular examI Rhythm:Regular Rate:Normal     Neuro/Psych negative neurological ROS  negative psych ROS   GI/Hepatic negative GI ROS, Neg liver ROS,   Endo/Other  negative endocrine ROSLarge thyroid -scanned yesterday shows slight compression and deviation of airway  CT also noted subglottic narrowing, but patent    Renal/GU Renal InsufficiencyRenal disease  negative genitourinary   Musculoskeletal negative musculoskeletal ROS (+)   Abdominal   Peds negative pediatric ROS (+)  Hematology negative hematology ROS (+)   Anesthesia Other Findings   Reproductive/Obstetrics negative OB ROS                             Anesthesia Physical Anesthesia Plan  ASA: III  Anesthesia Plan: General   Post-op Pain Management:    Induction: Intravenous  PONV Risk Score and Plan:   Airway Management Planned: Oral ETT  Additional Equipment:   Intra-op Plan:   Post-operative Plan: Extubation in OR  Informed Consent: I have reviewed the patients History and Physical, chart, labs and discussed the procedure including the risks, benefits and alternatives for the proposed anesthesia with the patient or authorized representative who has indicated his/her understanding and  acceptance.     Dental advisory given  Plan Discussed with: CRNA  Anesthesia Plan Comments: (Plan RSI with full PPE use as recommended.)       Anesthesia Quick Evaluation

## 2018-09-17 NOTE — Interval H&P Note (Signed)
History and Physical Interval Note:  09/17/2018 9:58 AM  Lisa [name]  has presented today for surgery, with the diagnosis of gallstone pancreatitis.  The various methods of treatment have been discussed with the patient and family. After consideration of risks, benefits and other options for treatment, the patient has consented to  Procedure(s): LAPAROSCOPIC CHOLECYSTECTOMY (N/A) as a surgical intervention.  The patient's history has been reviewed, patient examined, no change in status, stable for surgery.  I have reviewed the patient's chart and labs.  Questions were answered to the patient's satisfaction.    No additional questions. Radiologist has reviewed CT and trachea is 12 mm at the most narrow.   Virl Cagey
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
## 2018-09-17 NOTE — Care Management Important Message (Signed)
Important Message  Patient Details  Name: Lisa [name] MRN: [id number] Date of Birth: 01/02/1935   Medicare Important Message Given:  Yes    Sherald Barge, RN 09/17/2018, 1:25 PM

## 2018-09-17 NOTE — Progress Notes (Signed)
[place] patient's brother. Lap chole completed and everything went well.    She may go home today if feeling up to it. If she is able to eat and drink and have pain control, she can go home today versus tomorrow. Her post operative appointment will be over the phone unless she is having some problems.   Updated Dr. Cathlean Sauer.   Curlene Labrum, MD [facility] [address] [place], [place] [address] [phone] (office)

## 2018-09-17 NOTE — Progress Notes (Signed)
Patient's IV catheter removed and intact. Pt's IV site clean dry and intact. Discharge instructions including medications and follow up appointments were reviewed and discussed with patient. All questions were answered and no further questions at this time. Pt in stable condition and in no acute distress at time of discharge, Pt escorted by nurse tech.

## 2018-09-17 NOTE — Transfer of Care (Signed)
Immediate Anesthesia Transfer of Care Note  Patient: Lisa [name]  Procedure(s) Performed: LAPAROSCOPIC CHOLECYSTECTOMY (N/A )  Patient Location: PACU  Anesthesia Type:General  Level of Consciousness: awake and patient cooperative  Airway & Oxygen Therapy: Patient Spontanous Breathing  Post-op Assessment: Report given to RN and Post -op Vital signs reviewed and stable  Post vital signs: Reviewed and stable  Last Vitals:  Vitals Value Taken Time  BP 119/73 09/17/2018 11:15 AM  Temp    Pulse 53 09/17/2018 11:16 AM  Resp 23 09/17/2018 11:16 AM  SpO2 100 % 09/17/2018 11:16 AM  Vitals shown include unvalidated device data.  Last Pain:  Vitals:   09/17/18 0838  TempSrc: Oral  PainSc: 0-No pain      Patients Stated Pain Goal: 7 (90/90/30 1499)  Complications: No apparent anesthesia complications

## 2018-09-17 NOTE — Progress Notes (Signed)
[facility]  CT reviewed yesterday. Dr. Hilaria Ota reviewed read, report of some mild subglottic narrowing and he is a little concerned. He wants it reviewed.   The trachea on the sagittals and coronals looks wide with maybe mild narrowing.   Called Radiologist, Dr. Pascal Lux and he agrees mild narrowing if anything. He says there should be no reason for any issues with intubation. He does not think a smaller ET tube will even be needed. He is going to put measurements on the CT reading.  His help is much appreciated.  Curlene Labrum, MD [facility] [address] [place], [place] [address] [phone] (office)

## 2018-09-17 NOTE — Progress Notes (Signed)
Blood pressure 191/70, given hydralazine 10 mg IV as ordered PRN, alert and oriented, no complaints.discomforts voiced, will continue to monitor.

## 2018-09-17 NOTE — Discharge Summary (Signed)
Physician Discharge Summary  Lisa [name] YQM:[id number] DOB: June 11, 1935 DOA: 09/13/2018  PCP: [name], MD  Admit date: 09/13/2018 Discharge date: 09/17/2018  Admitted From: Home  Disposition:  Home   Recommendations for Outpatient Follow-up and new medication changes:  1. Follow up with Dr. Legrand Rams in 7 days.  2. Follow up with Dr. Constance Haw as outpatient (phone follow up). 3. Patient will continue with oxycodone as needed for abdominal pain.  4. Please follow-up on incidental finding of 13 x 9 mm spiculated nodule at the posterior left lung apex. 5. Please follow-up on left ICA stenosis estimated greater than 70% per ultrasound Doppler criteria  Home Health: no   Equipment/Devices: no    Discharge Condition: stable  CODE STATUS: full  Diet recommendation: heart healthy   Brief/Interim Summary: 84 year old female who presented with abdominal pain.  She does have significant past medical history for hypertension and COPD.  She reported 4 days of abdominal pain, nausea and vomiting.  On her initial physical examination blood pressure 199/64, heart rate 56, temperature 97.6, respiratory rate 16, oxygen saturation 99%.  Her lungs were clear to auscultation, heart S1-S2 present rhythm, abdomen was soft but tender in the right upper quadrant, no lower extremity edema.  Sodium 139, potassium 3.4, chloride 104, bicarb 25, glucose 129, BUN 29, creatinine 1.55, lipase 715, AST 80, ALT 75, white count 3.7, hemoglobin 9.9, hematocrit 36.7, platelets 144. Abdominal ultrasonography with moderate cholelithiasis with gallbladder wall thickening.  Common bile duct dilatation without choledocholithiasis.  Findings consistent with acute cholecystitis.  MRCP with cholelithiasis, largest stone measuring 1.8 cm diameter, ductal system appeared normal.  No evidence of choledocholithiasis. Chest film with hyperinflation but no infiltrates. EKG sinus rhythm, 57 bpm, normal axis, normal intervals, no ST segment  changes, T wave inversion II, III and aVF, plus V4 through V6. Positive LVH. (chronic changes).  Patient was admitted to the hospital with the working diagnosis of gallstone pancreatitis.   1.  Acute gallstone pancreatitis.  Patient was admitted to the medical ward, she received supportive medical therapy with IV fluids and IV analgesics.  Her diet was advanced with good toleration.  She was evaluated by Dr. Constance Haw from general surgery and she underwent laparoscopic cholecystectomy with no major complications.  She will continue pain control with as needed oxycodone, follow-up as an outpatient.  2.  Uncontrolled hypertension.  Patient was resumed on her antihypertensive agents, including losartan, labetalol and diltiazem.  She did receive clonidine while hospitalized, this agent is not included in her current home medications.  3.  COPD.  It remained stable with no signs of exacerbation, she was continued on bronchodilator therapy.  4.  Chronic kidney disease stage IV.  Her kidney function remained stable, her discharge creatinine is 1.0, potassium 4.0.  5.  Anxiety.  Patient did receive as needed lorazepam while hospitalized.  6.  Calorie protein malnutrition (underweight)/ clinically undetermined severity.  Calculated BMI 16.8 patient received nutritional supplements.  7.  Goiter.  Patient underwent CT of her neck which showed diffusely enlarged and heterogeneous thyroid.  Incidental finding of 13 x 9 mm spiculated nodule at the posterior left lung apex.  8.  Left ICA stenosis.  Left ICA stenosis estimated greater than 70% per ultrasound Doppler criteria.  No significant stenosis on the right.  Discharge Diagnoses:  Principal Problem:   Gallstone pancreatitis Active Problems:   Hypertension   COLD (chronic obstructive lung disease) (HCC)   Calculus of gallbladder without cholecystitis without obstruction  Abnormal LFTs (liver function tests)    Discharge  Instructions   Allergies as of 09/17/2018   No Known Allergies     Medication List    STOP taking these medications   furosemide 40 MG tablet Commonly known as:  LASIX     TAKE these medications   CENTRUM ADULTS PO Take 1 tablet by mouth daily.   diltiazem 120 MG 24 hr capsule Commonly known as:  TIAZAC Take 120 mg by mouth daily.   feeding supplement (ENSURE COMPLETE) Liqd Take 237 mLs by mouth 2 (two) times daily between meals.   hydrochlorothiazide 25 MG tablet Commonly known as:  HYDRODIURIL Take 25 mg by mouth daily.   Combivent Respimat 20-100 MCG/ACT Aers respimat Generic drug:  Ipratropium-Albuterol Inhale 1 puff into the lungs every 6 (six) hours as needed for wheezing or shortness of breath.   ipratropium-albuterol 0.5-2.5 (3) MG/3ML Soln Commonly known as:  DUONEB Take 3 mLs by nebulization every 6 (six) hours as needed. For shortness of breath   labetalol 300 MG tablet Commonly known as:  NORMODYNE Take 300 mg by mouth 2 (two) times daily.   losartan 100 MG tablet Commonly known as:  COZAAR Take 100 mg by mouth daily.   oxyCODONE 5 MG immediate release tablet Commonly known as:  Oxy IR/ROXICODONE Take 1 tablet (5 mg total) by mouth every 6 (six) hours as needed for severe pain.   oyster calcium 500 MG Tabs tablet Take 500 mg of elemental calcium by mouth daily.   vitamin C 500 MG tablet Commonly known as:  ASCORBIC ACID Take 500 mg by mouth daily.   Vitamin D3 125 MCG (5000 UT) Caps Take 1 capsule by mouth daily.   VITAMIN-B COMPLEX PO Take 1 tablet by mouth daily.      Follow-up Information    Virl Cagey, MD.   Specialty:  General Surgery Why:  Dr. Constance Haw will call you to check on you after 10/01/18. You will not have an appointment in person due to the current COVID 19 pandemic.  If you has issues or problems, call the office as we will see you if there is a problem.  Contact information: [address] [facility]  [address] [phone]          No Known Allergies  Consultations:  Surgery    Procedures/Studies: Ct Soft Tissue Neck W Contrast  Addendum Date: 09/17/2018   ADDENDUM REPORT: 09/17/2018 07:35 ADDENDUM: Called to discuss narrowing of the trachea as it relates to planned intubation. Tracheal deviation and subglottic narrowing is mild. At its narrowest, the trachea measures 12 mm in diameter. Electronically Signed   By: Monte Fantasia M.D.   On: 09/17/2018 07:35   Result Date: 09/17/2018 CLINICAL DATA:  Initial evaluation for large knot on left side of neck. EXAM: CT NECK WITH CONTRAST TECHNIQUE: Multidetector CT imaging of the neck was performed using the standard protocol following the bolus administration of intravenous contrast. CONTRAST:  20mL OMNIPAQUE IOHEXOL 300 MG/ML  SOLN COMPARISON:  None. FINDINGS: Pharynx and larynx: Oral cavity within normal limits without discrete mass or loculated collection. No acute abnormality about the remaining dentition. Base of tongue within normal limits. Oropharynx and nasopharynx within normal limits. No retropharyngeal collection. Epiglottis normal. Mass effect on the left pharynx related to the large left thyroid. Remainder of the hypopharynx and supraglottic larynx within normal limits. Glottis within normal limits. Subglottic airway mildly narrowed but patent. Salivary glands: Salivary glands including the parotid and submandibular  glands are normal. Thyroid: Diffuse heterogeneous enlargement of the thyroid with multiple superimposed dystrophic calcifications, most suggestive of goiter. Overall, enlargement is slightly more pronounced on the left. Substernal extension noted on the right. Mild mass effect on the subjacent airway which is somewhat narrowed and deviated to the left but remains patent. Lymph nodes: No pathologically enlarged lymph nodes identified within the neck. Vascular: Moderate atherosclerotic change seen about the aortic arch and carotid  bifurcations. Associated approximate 70% stenosis on the left, potentially hemodynamically significant. Both carotid arteries are deviated laterally about the enlarged thyroid. Limited intracranial: Unremarkable. Visualized orbits: Unremarkable. Mastoids and visualized paranasal sinuses: Mild chronic mucosal thickening within the ethmoidal air cells and maxillary sinuses. Paranasal sinuses are otherwise clear. Visualize mastoids and middle ear cavities are clear. Skeleton: No acute osseous finding. No discrete lytic or blastic osseous lesions. Visualized osseous structures are diffusely sclerotic, can be seen the setting of underlying renal osteodystrophy. Grade 1 anterolisthesis of C4 on C5, likely chronic and facet mediated. Moderate multilevel cervical spondylolysis. Upper chest: Visualized upper chest demonstrates no acute finding. Visualized upper chest demonstrates no acute finding. 13 x 9 mm spiculated nodule present at the posterior left lung apex (series 5, image 39). Other: None. IMPRESSION: 1. Diffusely enlarged and heterogeneous thyroid, most suggestive of goiter. Finding in counts for the palpable abnormality of concern. 2. 13 x 9 mm spiculated nodule at the posterior left lung apex, indeterminate. Further evaluation with dedicated cross-sectional imaging of the chest recommended. 3. Moderate atherosclerosis with associated potentially hemodynamically significant stenosis about the proximal left ICA. Correlation with carotid Doppler ultrasound and/or CTA could be performed for further characterization as clinically warranted. 4. Moderate multilevel cervical spondylolysis and facet arthrosis. Electronically Signed: By: Jeannine Boga M.D. On: 09/16/2018 14:20   Mr Abdomen Mrcp Wo Contrast  Addendum Date: 09/14/2018   ADDENDUM REPORT: 09/14/2018 08:40 ADDENDUM: Subsequent review of this exam shows an additional finding of pancreas divisum. Otherwise, no change to report below. Electronically  Signed   By: Earle Gell M.D.   On: 09/14/2018 08:40   Result Date: 09/14/2018 CLINICAL DATA:  Abnormal liver function tests. Cholelithiasis. Pancreatitis. EXAM: MRI ABDOMEN WITHOUT CONTRAST  (INCLUDING MRCP) TECHNIQUE: Multiplanar multisequence MR imaging of the abdomen was performed. Heavily T2-weighted images of the biliary and pancreatic ducts were obtained, and three-dimensional MRCP images were rendered by post processing. COMPARISON:  Ultrasound same day. FINDINGS: Lower chest: Negative Hepatobiliary: Liver parenchyma appears normal. The gallbladder contains multiple gallstones, the largest up to 1.8 cm in diameter. These are position near the gallbladder neck. Biliary tree does not appear abnormally dilated. No filling defect is seen in the common hepatic duct or common bile duct to suggest choledocholithiasis. The intrahepatic ducts are not dilated. Pancreatic duct is not dilated. Pancreas: The pancreatic duct is not dilated. No gross inflammatory change of the pancreas is identified. No evidence of pancreatic mass. Spleen:  Normal Adrenals/Urinary Tract: No adrenal lesion. Both kidneys appear normal. Stomach/Bowel: No gastrointestinal abnormality is identified. Vascular/Lymphatic:  Negative Other:  None Musculoskeletal: Curvature and degenerative change of the spine. IMPRESSION: Cholelithiasis. Largest stone measures 1.8 cm in diameter. Ductal system appears normal. No evidence of choledocholithiasis. No MRCP finding to allow diagnosis of cholecystitis. Pancreas appears normal. Electronically Signed: By: Nelson Chimes M.D. On: 09/13/2018 18:48   US Carotid Bilateral  Result Date: 09/16/2018 CLINICAL DATA:  Hypertension, coronary disease EXAM: BILATERAL CAROTID DUPLEX ULTRASOUND TECHNIQUE: Pearline Cables scale imaging, color Doppler and duplex ultrasound were performed of bilateral carotid  and vertebral arteries in the neck. COMPARISON:  None. FINDINGS: Criteria: Quantification of carotid stenosis is based on  velocity parameters that correlate the residual internal carotid diameter with NASCET-based stenosis levels, using the diameter of the distal internal carotid lumen as the denominator for stenosis measurement. The following velocity measurements were obtained: RIGHT ICA: 105/20 cm/sec CCA: 845/36 cm/sec SYSTOLIC ICA/CCA RATIO:  0.7 ECA: 191 cm/sec LEFT ICA: 237/29 cm/sec CCA: 468/03 cm/sec SYSTOLIC ICA/CCA RATIO:  1.6 ECA: 218 cm/sec RIGHT CAROTID ARTERY: Moderate heterogeneous partially calcified right carotid bifurcation atherosclerosis. Despite this, there is no hemodynamically significant right ICA stenosis, velocity elevation, or turbulent flow. Degree of narrowing estimated at less than 50% by ultrasound criteria. RIGHT VERTEBRAL ARTERY:  Antegrade LEFT CAROTID ARTERY: Heterogeneous moderate to severe left carotid intimal thickening and left proximal ICA calcific plaque formation. Proximal 2 mid ICA velocity elevation measures up to 237/29 centimeters/second. Mild turbulent flow and aliasing noted in the left mid ICA. By ultrasound criteria, left ICA stenosis estimated greater than 70%. LEFT VERTEBRAL ARTERY:  Antegrade IMPRESSION: Left ICA stenosis estimated at greater than 70% by ultrasound criteria Right ICA narrowing less than 50% Patent antegrade vertebral flow bilaterally Electronically Signed   By: Jerilynn Mages.  Shick M.D.   On: 09/16/2018 16:03   Mr 3d Recon At Scanner  Addendum Date: 09/14/2018   ADDENDUM REPORT: 09/14/2018 08:40 ADDENDUM: Subsequent review of this exam shows an additional finding of pancreas divisum. Otherwise, no change to report below. Electronically Signed   By: Earle Gell M.D.   On: 09/14/2018 08:40   Result Date: 09/14/2018 CLINICAL DATA:  Abnormal liver function tests. Cholelithiasis. Pancreatitis. EXAM: MRI ABDOMEN WITHOUT CONTRAST  (INCLUDING MRCP) TECHNIQUE: Multiplanar multisequence MR imaging of the abdomen was performed. Heavily T2-weighted images of the biliary and pancreatic  ducts were obtained, and three-dimensional MRCP images were rendered by post processing. COMPARISON:  Ultrasound same day. FINDINGS: Lower chest: Negative Hepatobiliary: Liver parenchyma appears normal. The gallbladder contains multiple gallstones, the largest up to 1.8 cm in diameter. These are position near the gallbladder neck. Biliary tree does not appear abnormally dilated. No filling defect is seen in the common hepatic duct or common bile duct to suggest choledocholithiasis. The intrahepatic ducts are not dilated. Pancreatic duct is not dilated. Pancreas: The pancreatic duct is not dilated. No gross inflammatory change of the pancreas is identified. No evidence of pancreatic mass. Spleen:  Normal Adrenals/Urinary Tract: No adrenal lesion. Both kidneys appear normal. Stomach/Bowel: No gastrointestinal abnormality is identified. Vascular/Lymphatic:  Negative Other:  None Musculoskeletal: Curvature and degenerative change of the spine. IMPRESSION: Cholelithiasis. Largest stone measures 1.8 cm in diameter. Ductal system appears normal. No evidence of choledocholithiasis. No MRCP finding to allow diagnosis of cholecystitis. Pancreas appears normal. Electronically Signed: By: Nelson Chimes M.D. On: 09/13/2018 18:48   US Abdomen Limited  Result Date: 09/13/2018 CLINICAL DATA:  Epigastric pain and right upper quadrant pain 3 days. EXAM: ULTRASOUND ABDOMEN LIMITED RIGHT UPPER QUADRANT COMPARISON:  None. FINDINGS: Gallbladder: Evidence of moderate cholelithiasis as the largest stone measures 2 cm. Mild wall thickening as the gallbladder wall measures 5.4 mm. Negative sonographic Murphy sign. No adjacent free fluid. Common bile duct: Diameter: 10.7 mm.  No definite ductal stones. Liver: No focal lesion identified. Within normal limits in parenchymal echogenicity. Portal vein is patent on color Doppler imaging with normal direction of blood flow towards the liver. IMPRESSION: Moderate cholelithiasis with gallbladder  wall thickening. Common bile duct dilatation without choledocholithiasis. Findings may be due to  acute cholecystitis. Electronically Signed   By: Marin Olp M.D.   On: 09/13/2018 15:44   Dg Chest Port 1 View  Result Date: 09/14/2018 CLINICAL DATA:  Patient has an upper airway wheeze or/of unknown sound. She shows this when she is short of breath , it goes away when she is not sob. Its similar to and exercise induced audible wheeze. No wheeze in lung fields other than what is transmitted at this time. She does not wear oxygen. She does have a history of smoking.Hx COPD35 year old female with accelerated hypertension and COPD presented to the ED with complaints of ongoing abdominal pain nausea and vomiting and inability to eat for the past several days. EXAM: PORTABLE CHEST 1 VIEW COMPARISON:  05/07/2014 and older exams. FINDINGS: The cardiac silhouette is normal in size. No mediastinal or hilar masses. No evidence of adenopathy. Clear lungs.  No pleural effusion or pneumothorax. Skeletal structures are intact. IMPRESSION: No active disease. Electronically Signed   By: Lajean Manes M.D.   On: 09/14/2018 09:34     04/07 laparoscopic cholecystectomy  Subjective: Patient is feeling better, no significant post operative abdominal pain, tolerating well po with no nausea or vomiting. No chest pain or dyspnea.   Discharge Exam: Vitals:   09/17/18 1130 09/17/18 1145  BP: (!) 142/55 (!) 141/57  Pulse: (!) 55 (!) 55  Resp: (!) 21 20  Temp:    SpO2: 92% 94%   Vitals:   09/17/18 0838 09/17/18 1115 09/17/18 1130 09/17/18 1145  BP: (!) 220/82 119/73 (!) 142/55 (!) 141/57  Pulse: 60 (!) 53 (!) 55 (!) 55  Resp: 20 (!) 23 (!) 21 20  Temp: 98 F (36.7 C) 97.7 F (36.5 C)    TempSrc: Oral     SpO2: 99% 100% 92% 94%  Weight:      Height:        General: Not in pain or dyspnea.  Neurology: Awake and alert, non focal  E ENT: no pallor, no icterus, oral mucosa moist. Positive goiter Cardiovascular:  No JVD. S1-S2 present, rhythmic, no gallops, rubs, or murmurs. No lower extremity edema. Pulmonary: positive breath sounds bilaterally, adequate air movement, no wheezing, rhonchi or rales. Gastrointestinal. Abdomen with no organomegaly, mild tender with  no rebound or guarding Skin. No rashes Musculoskeletal: no joint deformities   The results of significant diagnostics from this hospitalization (including imaging, microbiology, ancillary and laboratory) are listed below for reference.     Microbiology: Recent Results (from the past 240 hour(s))  Surgical pcr screen     Status: None   Collection Time: 09/15/18  9:20 PM  Result Value Ref Range Status   MRSA, PCR NEGATIVE NEGATIVE Final   Staphylococcus aureus NEGATIVE NEGATIVE Final    Comment: (NOTE) The Xpert SA Assay (FDA approved for NASAL specimens in patients 34 years of age and older), is one component of a comprehensive surveillance program. It is not intended to diagnose infection nor to guide or monitor treatment. Performed at [facility], [address] [place],  [address]      Labs: BNP (last 3 results) No results for input(s): BNP in the last 8760 hours. Basic Metabolic Panel: Recent Labs  Lab 09/13/18 1413 09/14/18 0609 09/15/18 0627 09/16/18 0424 09/17/18 0446  NA 139 138 137 140 139  K 3.4* 3.8 3.7 4.0 3.4*  CL 104 108 108 109 109  CO2 25 23 21* 23 23  GLUCOSE 129* 88 91 93 103*  BUN 29* 23 17 15  14  CREATININE 1.55* 1.26* 1.10* 1.08* 1.12*  CALCIUM 9.1 8.7* 8.8* 9.2 8.8*  MG  --   --  1.8  --   --    Liver Function Tests: Recent Labs  Lab 09/13/18 1413 09/14/18 0609 09/15/18 0627 09/16/18 0424  AST 80* 59* 49* 49*  ALT 75* 60* 52* 47*  ALKPHOS 158* 151* 126 119  BILITOT 2.1* 1.4* 1.0 1.0  PROT 7.7 7.0 7.0 7.2  ALBUMIN 3.3* 2.9* 3.0* 3.0*   Recent Labs  Lab 09/13/18 1413 09/14/18 0609 09/15/18 0627  LIPASE 715* 85* 45   No results for input(s): AMMONIA in the last 168  hours. CBC: Recent Labs  Lab 09/13/18 1413 09/14/18 0609 09/15/18 0627 09/16/18 0424 09/17/18 0446  WBC 3.7* 3.1* 4.0 4.0 4.2  NEUTROABS  --   --  2.3  --  2.2  HGB 11.9* 11.1* 10.8* 10.8* 10.6*  HCT 36.7 33.4* 32.9* 33.1* 32.9*  MCV 92.0 91.3 91.4 92.2 92.2  PLT 144* 133* 131* 106* 135*   Cardiac Enzymes: No results for input(s): CKTOTAL, CKMB, CKMBINDEX, TROPONINI in the last 168 hours. BNP: Invalid input(s): POCBNP CBG: No results for input(s): GLUCAP in the last 168 hours. D-Dimer No results for input(s): DDIMER in the last 72 hours. Hgb A1c No results for input(s): HGBA1C in the last 72 hours. Lipid Profile No results for input(s): CHOL, HDL, LDLCALC, TRIG, CHOLHDL, LDLDIRECT in the last 72 hours. Thyroid function studies No results for input(s): TSH, T4TOTAL, T3FREE, THYROIDAB in the last 72 hours.  Invalid input(s): FREET3 Anemia work up No results for input(s): VITAMINB12, FOLATE, FERRITIN, TIBC, IRON, RETICCTPCT in the last 72 hours. Urinalysis    Component Value Date/Time   COLORURINE AMBER (A) 09/13/2018 1413   APPEARANCEUR HAZY (A) 09/13/2018 1413   LABSPEC 1.025 09/13/2018 1413   PHURINE 5.0 09/13/2018 1413   GLUCOSEU NEGATIVE 09/13/2018 1413   HGBUR NEGATIVE 09/13/2018 1413   BILIRUBINUR SMALL (A) 09/13/2018 1413   KETONESUR NEGATIVE 09/13/2018 1413   PROTEINUR 100 (A) 09/13/2018 1413   UROBILINOGEN 0.2 06/02/2014 1715   NITRITE NEGATIVE 09/13/2018 1413   LEUKOCYTESUR NEGATIVE 09/13/2018 1413   Sepsis Labs Invalid input(s): PROCALCITONIN,  WBC,  LACTICIDVEN Microbiology Recent Results (from the past 240 hour(s))  Surgical pcr screen     Status: None   Collection Time: 09/15/18  9:20 PM  Result Value Ref Range Status   MRSA, PCR NEGATIVE NEGATIVE Final   Staphylococcus aureus NEGATIVE NEGATIVE Final    Comment: (NOTE) The Xpert SA Assay (FDA approved for NASAL specimens in patients 59 years of age and older), is one component of a  comprehensive surveillance program. It is not intended to diagnose infection nor to guide or monitor treatment. Performed at [facility], [address], [place], [place] [address]      Time coordinating discharge: 45 minutes  SIGNED:   Tawni Millers, MD  [facility] 09/17/2018, 12:51 PM

## 2018-09-17 NOTE — Anesthesia Postprocedure Evaluation (Signed)
Anesthesia Post Note  Patient: Lisa [name]  Procedure(s) Performed: LAPAROSCOPIC CHOLECYSTECTOMY (N/A )  Patient location during evaluation: PACU Anesthesia Type: General Level of consciousness: awake and alert and patient cooperative Pain management: satisfactory to patient Vital Signs Assessment: post-procedure vital signs reviewed and stable Respiratory status: spontaneous breathing Cardiovascular status: stable Postop Assessment: no apparent nausea or vomiting Anesthetic complications: no     Last Vitals:  Vitals:   09/17/18 1145 09/17/18 1251  BP: (!) 141/57 (!) 135/51  Pulse: (!) 55 (!) 50  Resp: 20 20  Temp:  36.4 C  SpO2: 94% 98%    Last Pain:  Vitals:   09/17/18 1259  TempSrc:   PainSc: 8                  Oluwafemi Villella

## 2018-09-17 NOTE — Discharge Instructions (Signed)
Discharge Laparoscopic Surgery Instructions:  Common Complaints: Right shoulder pain is common after laparoscopic surgery. This is secondary to the gas used in the surgery being trapped under the diaphragm.  Walk to help your body absorb the gas. This will improve in a few days. Pain at the port sites are common, especially the larger port sites. This will improve with time.  Some nausea is common and poor appetite. The main goal is to stay hydrated the first few days after surgery.   Diet/ Activity: Diet as tolerated. You may not have an appetite, but it is important to stay hydrated. Drink 64 ounces of water a day. Your appetite will return with time.  Shower per your regular routine daily.  Do not take hot showers. Take warm showers that are less than 10 minutes. Rest and listen to your body, but do not remain in bed all day.  Walk everyday for at least 15-20 minutes. Deep cough and move around every 1-2 hours in the first few days after surgery.  Do not lift > 10 lbs, perform excessive bending, pushing, pulling, squatting for 1-2 weeks after surgery.  Do not pick at the dermabond glue on your incision sites.  This glue film will remain in place for 1-2 weeks and will start to peel off.  Do not place lotions or balms on your incision unless instructed to specifically by Dr. Constance Haw.   Medication: Take tylenol and ibuprofen as needed for pain control, alternating every 4-6 hours.  Example:  Tylenol 1000mg  @ 6am, 12noon, 6pm, 67midnight (Do not exceed 4000mg  of tylenol a day). Ibuprofen 800mg  @ 9am, 3pm, 9pm, 3am (Do not exceed 3600mg  of ibuprofen a day).  Take Roxicodone for breakthrough pain every 4 hours.  Take Colace for constipation related to narcotic pain medication. If you do not have a bowel movement in 2 days, take Miralax over the counter.  Drink plenty of water to also prevent constipation.   Contact Information: If you have questions or concerns, please call our office,  [phone], Monday- Thursday 8AM-5PM and Friday 8AM-12Noon.  If it is after hours or on the weekend, please call [facility], [phone], and ask to speak to the surgeon on call for Dr. Constance Haw at [facility]   Laparoscopic Cholecystectomy, Care After This sheet gives you information about how to care for yourself after your procedure. Your doctor may also give you more specific instructions. If you have problems or questions, contact your doctor. Follow these instructions at home: Care for cuts from surgery (incisions)   Follow instructions from your doctor about how to take care of your cuts from surgery. Make sure you: ? Wash your hands with soap and water before you change your bandage (dressing). If you cannot use soap and water, use hand sanitizer. ? Change your bandage as told by your doctor. ? Leave stitches (sutures), skin glue, or skin tape (adhesive) strips in place. They may need to stay in place for 2 weeks or longer. If tape strips get loose and curl up, you may trim the loose edges. Do not remove tape strips completely unless your doctor says it is okay.  Do not take baths, swim, or use a hot tub until your doctor says it is okay.   You may shower.  Check your surgical cut area every day for signs of infection. Check for: ? More redness, swelling, or pain. ? More fluid or blood. ? Warmth. ? Pus or a bad smell. Activity  Do not  drive or use heavy machinery while taking prescription pain medicine.  Do not lift anything that is heavier than 10 lb (4.5 kg) until your doctor says it is okay.  Do not play contact sports until your doctor says it is okay.  Do not drive for 24 hours if you were given a medicine to help you relax (sedative).  Rest as needed. Do not return to work or school until your doctor says it is okay. General instructions  Take over-the-counter and prescription medicines only as told by your doctor.  To prevent or treat constipation while  you are taking prescription pain medicine, your doctor may recommend that you: ? Drink enough fluid to keep your pee (urine) clear or pale yellow. ? Take over-the-counter or prescription medicines. ? Eat foods that are high in fiber, such as fresh fruits and vegetables, whole grains, and beans. ? Limit foods that are high in fat and processed sugars, such as fried and sweet foods. Contact a doctor if:  You develop a rash.  You have more redness, swelling, or pain around your surgical cuts.  You have more fluid or blood coming from your surgical cuts.  Your surgical cuts feel warm to the touch.  You have pus or a bad smell coming from your surgical cuts.  You have a fever.  One or more of your surgical cuts breaks open. Get help right away if:  You have trouble breathing.  You have chest pain.  You have pain that is getting worse in your shoulders.  You faint or feel dizzy when you stand.  You have very bad pain in your belly (abdomen).  You are sick to your stomach (nauseous) for more than one day.  You have throwing up (vomiting) that lasts for more than one day.  You have leg pain. This information is not intended to replace advice given to you by your health care provider. Make sure you discuss any questions you have with your health care provider. Document Released: 03/07/2008 Document Revised: 12/18/2015 Document Reviewed: 11/15/2015 Elsevier Interactive Patient Education  2019 [employer].

## 2018-09-17 NOTE — Anesthesia Procedure Notes (Signed)
Procedure Name: Intubation Date/Time: 09/17/2018 10:17 AM Performed by: Vista Deck, CRNA Pre-anesthesia Checklist: Patient identified, Patient being monitored, Timeout performed, Emergency Drugs available and Suction available Patient Re-evaluated:Patient Re-evaluated prior to induction Oxygen Delivery Method: Circle System Utilized Preoxygenation: Pre-oxygenation with 100% oxygen Induction Type: IV induction Laryngoscope Size: Mac and 3 Grade View: Grade II Tube type: Oral Tube size: 7.0 mm Number of attempts: 1 Airway Equipment and Method: stylet Placement Confirmation: ETT inserted through vocal cords under direct vision,  positive ETCO2 and breath sounds checked- equal and bilateral Secured at: 21 cm Tube secured with: Tape Dental Injury: Teeth and Oropharynx as per pre-operative assessment

## 2018-09-18 ENCOUNTER — Encounter (HOSPITAL_COMMUNITY): Payer: Self-pay | Admitting: General Surgery

## 2018-09-30 ENCOUNTER — Telehealth: Payer: Self-pay | Admitting: General Surgery

## 2018-09-30 DIAGNOSIS — K8041 Calculus of bile duct with cholecystitis, unspecified, with obstruction: Secondary | ICD-10-CM | POA: Diagnosis not present

## 2018-09-30 DIAGNOSIS — R627 Adult failure to thrive: Secondary | ICD-10-CM | POA: Diagnosis not present

## 2018-09-30 DIAGNOSIS — J449 Chronic obstructive pulmonary disease, unspecified: Secondary | ICD-10-CM | POA: Diagnosis not present

## 2018-09-30 NOTE — Telephone Encounter (Signed)
[facility]  I am calling the patient for post operative evaluation due to the current COVID 19 pandemic.  The patient had a laparoscopic cholecystectomy on 09/17/2018. The patient reports that they are doing well.. The are tolerating a diet, having good pain control, and having regular Bms.  The patient has one concern of pain at her belly button. She says there is hardness in the area. No redness or drainage and her incisions are all healing. It sounds like induration from the port, but I gave her specific things to watch out for including worsening pain, nausea/vomiting, no BM in case this a hernia. If this does not improve in the next 4-6 weeks she will let us know.   Will see the patient PRN.   Pathology Diagnosis Gallbladder - CHRONIC CHOLECYSTITIS AND CHOLELITHIASIS.  Patient also asking about her incidental lung nodule. Told patient to get in touch with Dr. Legrand Rams about getting follow up CT regarding this finding.   Will have my office refer patient to Vascular given the L ICA stenosis of 70%, she is asymptomatic.    Curlene Labrum, MD [facility] [address] [place], [place] [address] [phone] (office)

## 2018-10-01 ENCOUNTER — Ambulatory Visit: Payer: Medicare Other | Admitting: General Surgery

## 2018-10-04 ENCOUNTER — Other Ambulatory Visit: Payer: Self-pay

## 2018-10-04 DIAGNOSIS — I6529 Occlusion and stenosis of unspecified carotid artery: Secondary | ICD-10-CM

## 2018-10-08 ENCOUNTER — Ambulatory Visit (HOSPITAL_COMMUNITY)
Admission: RE | Admit: 2018-10-08 | Discharge: 2018-10-08 | Disposition: A | Discharge status: Home / Self Care | Payer: Medicare Other | Source: Ambulatory Visit | Attending: Vascular Surgery | Admitting: Vascular Surgery

## 2018-10-08 ENCOUNTER — Ambulatory Visit (INDEPENDENT_AMBULATORY_CARE_PROVIDER_SITE_OTHER): Payer: Medicare Other | Admitting: Vascular Surgery

## 2018-10-08 ENCOUNTER — Encounter: Payer: Self-pay | Admitting: Vascular Surgery

## 2018-10-08 ENCOUNTER — Other Ambulatory Visit: Payer: Self-pay

## 2018-10-08 VITALS — BP 190/71 | HR 94 | Temp 97.7°F | Resp 20 | Ht 67.0 in | Wt 109.0 lb

## 2018-10-08 DIAGNOSIS — I6529 Occlusion and stenosis of unspecified carotid artery: Secondary | ICD-10-CM

## 2018-10-08 NOTE — Progress Notes (Signed)
[facility] [place]  Patient name: Lisa [name] MRN: [id number] DOB: [birth year] Sex: female  REASON FOR CONSULT: Evaluation left carotid stenosis  HPI: Lisa [name] is a 84 y.o. female, who is here today for evaluation of left carotid stenosis.  She has a history of hypertension and coronary artery disease.  Underwent outpatient carotid duplex at [facility] and the interpretation was greater than 70% left carotid stenosis and she is seen today for further discussion.  She is right-handed.  She specifically denies any prior history of amaurosis fugax, a aphasia or transient ischemic attack or stroke.  She reports that since 2015 she has had difficulty with balance and has had extensive work-up with no clear cause for this.  She walks with a rolling walker due to this.  She otherwise is active independent.  Past Medical History:  Diagnosis Date  . COPD (chronic obstructive pulmonary disease) ([place])   . Hypertension   . Pyelonephritis 04/06/2014   UTI    Family History  Problem Relation Age of Onset  . Hypertension Mother   . Thyroid disease Mother   . Hypertension Father   . Thyroid disease Sister   . Thyroid disease Brother   . Thyroid disease Sister     SOCIAL HISTORY: Social History   Socioeconomic History  . Marital status: Widowed    Spouse name: Not on file  . Number of children: Not on file  . Years of education: Not on file  . Highest education level: Not on file  Occupational History  . Not on file  Social Needs  . Financial resource strain: Not on file  . Food insecurity:    Worry: Not on file    Inability: Not on file  . Transportation needs:    Medical: Not on file    Non-medical: Not on file  Tobacco Use  . Smoking status: Former Smoker    Types: Cigarettes    Last attempt to quit: 05/12/2014    Years since quitting: 4.4  . Smokeless tobacco: Never Used  Substance and Sexual  Activity  . Alcohol use: No  . Drug use: No  . Sexual activity: Not on file  Lifestyle  . Physical activity:    Days per week: Not on file    Minutes per session: Not on file  . Stress: Not on file  Relationships  . Social connections:    Talks on phone: Not on file    Gets together: Not on file    Attends religious service: Not on file    Active member of club or organization: Not on file    Attends meetings of clubs or organizations: Not on file    Relationship status: Not on file  . Intimate partner violence:    Fear of current or ex partner: Not on file    Emotionally abused: Not on file    Physically abused: Not on file    Forced sexual activity: Not on file  Other Topics Concern  . Not on file  Social History Narrative  . Not on file    No Known Allergies  Current Outpatient Medications  Medication Sig Dispense Refill  . B Complex Vitamins (VITAMIN-B COMPLEX PO) Take 1 tablet by mouth daily.    . Cholecalciferol (VITAMIN D3) 125 MCG (5000 UT) CAPS Take 1 capsule by mouth daily.    Marland Kitchen diltiazem (TIAZAC) 120 MG 24 hr capsule Take 120 mg by mouth daily.     Marland Kitchen  feeding supplement, ENSURE COMPLETE, (ENSURE COMPLETE) LIQD Take 237 mLs by mouth 2 (two) times daily between meals.    . hydrochlorothiazide (HYDRODIURIL) 25 MG tablet Take 25 mg by mouth daily.    . Ipratropium-Albuterol (COMBIVENT RESPIMAT) 20-100 MCG/ACT AERS respimat Inhale 1 puff into the lungs every 6 (six) hours as needed for wheezing or shortness of breath.     Marland Kitchen ipratropium-albuterol (DUONEB) 0.5-2.5 (3) MG/3ML SOLN Take 3 mLs by nebulization every 6 (six) hours as needed. For shortness of breath    . labetalol (NORMODYNE) 300 MG tablet Take 300 mg by mouth 2 (two) times daily.     Marland Kitchen losartan (COZAAR) 100 MG tablet Take 100 mg by mouth daily.    . Multiple Vitamins-Minerals (CENTRUM ADULTS PO) Take 1 tablet by mouth daily.    Marland Kitchen oxyCODONE (OXY IR/ROXICODONE) 5 MG immediate release tablet Take 1 tablet (5 mg  total) by mouth every 6 (six) hours as needed for severe pain. 10 tablet 0  . Oyster [place] (OYSTER CALCIUM) 500 MG TABS tablet Take 500 mg of elemental calcium by mouth daily.    . vitamin C (ASCORBIC ACID) 500 MG tablet Take 500 mg by mouth daily.     No current facility-administered medications for this visit.     REVIEW OF SYSTEMS:  [X]  denotes positive finding, [ ]  denotes negative finding Cardiac  Comments:  Chest pain or chest pressure:    Shortness of breath upon exertion:    Short of breath when lying flat:    Irregular heart rhythm:        Vascular    Pain in calf, thigh, or hip brought on by ambulation:    Pain in feet at night that wakes you up from your sleep:     Blood clot in your veins:    Leg swelling:         Pulmonary    Oxygen at home:    Productive cough:     Wheezing:         Neurologic    Sudden weakness in arms or legs:     Sudden numbness in arms or legs:     Sudden onset of difficulty speaking or slurred speech:    Temporary loss of vision in one eye:     Problems with dizziness:         Gastrointestinal    Blood in stool:     Vomited blood:         Genitourinary    Burning when urinating:     Blood in urine:        Psychiatric    Major depression:         Hematologic    Bleeding problems:    Problems with blood clotting too easily:        Skin    Rashes or ulcers:        Constitutional    Fever or chills:      PHYSICAL EXAM: Vitals:   10/08/18 1233 10/08/18 1236  BP: (!) 199/76 (!) 190/71  Pulse: 94   Resp: 20   Temp: 97.7 F (36.5 C)   SpO2: 96%   Weight: 109 lb (49.4 kg)   Height: 5\' 7"  (1.702 m)     GENERAL: The patient is a well-nourished female, in no acute distress. The vital signs are documented above. CARDIOVASCULAR: Carotid arteries without bruits bilaterally.  2+ radial and 2+ posterior tibial arteries pulses PULMONARY: There is good air exchange  ABDOMEN: Soft and non-tender  MUSCULOSKELETAL: There are no  major deformities or cyanosis. NEUROLOGIC: No focal weakness or paresthesias are detected. SKIN: There are no ulcers or rashes noted. PSYCHIATRIC: The patient has a normal affect.  DATA:  I reviewed her carotid duplex from [facility].  We repeated this today in our office.  Although the interpretation at [facility] was greater than 70% stenosis of her left internal carotid artery, her flow velocities in our lab would be lower than this.  Our interpretation of today's study is 40 to 59% stenosis in her left internal carotid with no significant right carotid stenosis  MEDICAL ISSUES: Discussed this at length with the patient.  She does have a moderate left carotid stenosis with no symptoms.  I did explain stroke symptoms to her and she notify us [place] immediately should this occur.  Otherwise we will see her in 6 months with repeat carotid duplex.  If this confirms moderate stenosis would drop back to yearly surveillance after that.   Rosetta Posner, MD FACS [facility] Tel [phone] Pager [phone]

## 2018-12-26 DIAGNOSIS — Z0001 Encounter for general adult medical examination with abnormal findings: Secondary | ICD-10-CM | POA: Diagnosis not present

## 2018-12-26 DIAGNOSIS — R627 Adult failure to thrive: Secondary | ICD-10-CM | POA: Diagnosis not present

## 2018-12-26 DIAGNOSIS — J449 Chronic obstructive pulmonary disease, unspecified: Secondary | ICD-10-CM | POA: Diagnosis not present

## 2018-12-26 DIAGNOSIS — Z1389 Encounter for screening for other disorder: Secondary | ICD-10-CM | POA: Diagnosis not present

## 2018-12-26 DIAGNOSIS — I10 Essential (primary) hypertension: Secondary | ICD-10-CM | POA: Diagnosis not present

## 2018-12-30 DIAGNOSIS — Z0001 Encounter for general adult medical examination with abnormal findings: Secondary | ICD-10-CM | POA: Diagnosis not present

## 2018-12-30 DIAGNOSIS — J449 Chronic obstructive pulmonary disease, unspecified: Secondary | ICD-10-CM | POA: Diagnosis not present

## 2018-12-30 DIAGNOSIS — I10 Essential (primary) hypertension: Secondary | ICD-10-CM | POA: Diagnosis not present

## 2019-01-26 DIAGNOSIS — J449 Chronic obstructive pulmonary disease, unspecified: Secondary | ICD-10-CM | POA: Diagnosis not present

## 2019-01-26 DIAGNOSIS — I10 Essential (primary) hypertension: Secondary | ICD-10-CM | POA: Diagnosis not present

## 2019-02-17 DIAGNOSIS — I10 Essential (primary) hypertension: Secondary | ICD-10-CM | POA: Diagnosis not present

## 2019-02-17 DIAGNOSIS — J449 Chronic obstructive pulmonary disease, unspecified: Secondary | ICD-10-CM | POA: Diagnosis not present

## 2019-02-20 DIAGNOSIS — J449 Chronic obstructive pulmonary disease, unspecified: Secondary | ICD-10-CM | POA: Diagnosis not present

## 2019-03-18 DIAGNOSIS — J449 Chronic obstructive pulmonary disease, unspecified: Secondary | ICD-10-CM | POA: Diagnosis not present

## 2019-03-18 DIAGNOSIS — I10 Essential (primary) hypertension: Secondary | ICD-10-CM | POA: Diagnosis not present

## 2019-03-19 DIAGNOSIS — Z23 Encounter for immunization: Secondary | ICD-10-CM | POA: Diagnosis not present

## 2019-03-22 DIAGNOSIS — J449 Chronic obstructive pulmonary disease, unspecified: Secondary | ICD-10-CM | POA: Diagnosis not present

## 2019-04-18 DIAGNOSIS — J449 Chronic obstructive pulmonary disease, unspecified: Secondary | ICD-10-CM | POA: Diagnosis not present

## 2019-04-18 DIAGNOSIS — I10 Essential (primary) hypertension: Secondary | ICD-10-CM | POA: Diagnosis not present

## 2019-04-22 DIAGNOSIS — J449 Chronic obstructive pulmonary disease, unspecified: Secondary | ICD-10-CM | POA: Diagnosis not present

## 2019-04-23 DIAGNOSIS — J449 Chronic obstructive pulmonary disease, unspecified: Secondary | ICD-10-CM | POA: Diagnosis not present

## 2019-04-23 DIAGNOSIS — R2681 Unsteadiness on feet: Secondary | ICD-10-CM | POA: Diagnosis not present

## 2019-05-22 DIAGNOSIS — J449 Chronic obstructive pulmonary disease, unspecified: Secondary | ICD-10-CM | POA: Diagnosis not present

## 2019-06-12 DIAGNOSIS — E049 Nontoxic goiter, unspecified: Secondary | ICD-10-CM | POA: Diagnosis not present

## 2019-06-12 DIAGNOSIS — R627 Adult failure to thrive: Secondary | ICD-10-CM | POA: Diagnosis not present

## 2019-06-12 DIAGNOSIS — I10 Essential (primary) hypertension: Secondary | ICD-10-CM | POA: Diagnosis not present

## 2019-06-12 DIAGNOSIS — J449 Chronic obstructive pulmonary disease, unspecified: Secondary | ICD-10-CM | POA: Diagnosis not present

## 2019-06-22 DIAGNOSIS — J449 Chronic obstructive pulmonary disease, unspecified: Secondary | ICD-10-CM | POA: Diagnosis not present

## 2019-07-10 DIAGNOSIS — I10 Essential (primary) hypertension: Secondary | ICD-10-CM | POA: Diagnosis not present

## 2019-07-10 DIAGNOSIS — J449 Chronic obstructive pulmonary disease, unspecified: Secondary | ICD-10-CM | POA: Diagnosis not present

## 2019-07-23 DIAGNOSIS — J449 Chronic obstructive pulmonary disease, unspecified: Secondary | ICD-10-CM | POA: Diagnosis not present

## 2019-08-10 DIAGNOSIS — R627 Adult failure to thrive: Secondary | ICD-10-CM | POA: Diagnosis not present

## 2019-08-10 DIAGNOSIS — M4626 Osteomyelitis of vertebra, lumbar region: Secondary | ICD-10-CM | POA: Diagnosis not present

## 2019-08-10 DIAGNOSIS — J449 Chronic obstructive pulmonary disease, unspecified: Secondary | ICD-10-CM | POA: Diagnosis not present

## 2019-08-20 DIAGNOSIS — J449 Chronic obstructive pulmonary disease, unspecified: Secondary | ICD-10-CM | POA: Diagnosis not present

## 2019-08-27 DIAGNOSIS — J449 Chronic obstructive pulmonary disease, unspecified: Secondary | ICD-10-CM | POA: Diagnosis not present

## 2019-08-27 DIAGNOSIS — I10 Essential (primary) hypertension: Secondary | ICD-10-CM | POA: Diagnosis not present

## 2019-08-27 DIAGNOSIS — Z0001 Encounter for general adult medical examination with abnormal findings: Secondary | ICD-10-CM | POA: Diagnosis not present

## 2019-08-27 DIAGNOSIS — E43 Unspecified severe protein-calorie malnutrition: Secondary | ICD-10-CM | POA: Diagnosis not present

## 2019-08-27 DIAGNOSIS — Z1389 Encounter for screening for other disorder: Secondary | ICD-10-CM | POA: Diagnosis not present

## 2019-09-01 DIAGNOSIS — E049 Nontoxic goiter, unspecified: Secondary | ICD-10-CM | POA: Diagnosis not present

## 2019-09-01 DIAGNOSIS — I10 Essential (primary) hypertension: Secondary | ICD-10-CM | POA: Diagnosis not present

## 2019-09-01 DIAGNOSIS — Z0001 Encounter for general adult medical examination with abnormal findings: Secondary | ICD-10-CM | POA: Diagnosis not present

## 2019-09-01 DIAGNOSIS — J449 Chronic obstructive pulmonary disease, unspecified: Secondary | ICD-10-CM | POA: Diagnosis not present

## 2019-09-20 DIAGNOSIS — J449 Chronic obstructive pulmonary disease, unspecified: Secondary | ICD-10-CM | POA: Diagnosis not present

## 2019-09-27 DIAGNOSIS — I10 Essential (primary) hypertension: Secondary | ICD-10-CM | POA: Diagnosis not present

## 2019-09-27 DIAGNOSIS — E049 Nontoxic goiter, unspecified: Secondary | ICD-10-CM | POA: Diagnosis not present

## 2019-10-08 ENCOUNTER — Other Ambulatory Visit: Payer: Self-pay | Admitting: *Deleted

## 2019-10-08 DIAGNOSIS — I6529 Occlusion and stenosis of unspecified carotid artery: Secondary | ICD-10-CM

## 2019-10-14 ENCOUNTER — Telehealth (HOSPITAL_COMMUNITY): Payer: Self-pay

## 2019-10-14 NOTE — Telephone Encounter (Signed)

## 2019-10-15 ENCOUNTER — Ambulatory Visit (INDEPENDENT_AMBULATORY_CARE_PROVIDER_SITE_OTHER): Payer: Medicare Other | Admitting: Physician Assistant

## 2019-10-15 ENCOUNTER — Ambulatory Visit (HOSPITAL_COMMUNITY)
Admission: RE | Admit: 2019-10-15 | Discharge: 2019-10-15 | Disposition: A | Discharge status: Home / Self Care | Payer: Medicare Other | Source: Ambulatory Visit | Attending: Surgery | Admitting: Surgery

## 2019-10-15 ENCOUNTER — Other Ambulatory Visit: Payer: Self-pay

## 2019-10-15 VITALS — BP 174/62 | HR 60 | Temp 97.3°F | Resp 16 | Ht 67.0 in | Wt 107.1 lb

## 2019-10-15 DIAGNOSIS — I6529 Occlusion and stenosis of unspecified carotid artery: Secondary | ICD-10-CM | POA: Insufficient documentation

## 2019-10-15 NOTE — Progress Notes (Signed)
History of Present Illness:  Patient is a 85 y.o. year old female who presents for evaluation of carotid stenosis.  She has a history of left ICA stenosis when she underwent a carotid duplex in April of 2020.  At that time she was asymptomatic.  She is followed by Dr. Donnetta Hutching.  She denise prior history of amaurosis fugax, a aphasia or transient ischemic attack or stroke.   She has a history of hypertension and coronary artery disease.  Past Medical History:  Diagnosis Date  . COPD (chronic obstructive pulmonary disease) ([place])   . Hypertension   . Pyelonephritis 04/06/2014   UTI    Past Surgical History:  Procedure Laterality Date  . ABDOMINAL HYSTERECTOMY    . CHOLECYSTECTOMY N/A 09/17/2018   Procedure: LAPAROSCOPIC CHOLECYSTECTOMY;  Surgeon: Virl Cagey, MD;  Location: [facility];  Service: General;  Laterality: N/A;  . TEE WITHOUT CARDIOVERSION N/A 04/10/2014   Procedure: TRANSESOPHAGEAL ECHOCARDIOGRAM (TEE);  Surgeon: [name], MD;  Location[facility] ENDOSCOPY;  Service: Cardiovascular;  Laterality: N/A;     Social History Social History   Tobacco Use  . Smoking status: Former Smoker    Types: Cigarettes    Quit date: 05/12/2014    Years since quitting: 5.4  . Smokeless tobacco: Never Used  Substance Use Topics  . Alcohol use: No  . Drug use: No    Family History Family History  Problem Relation Age of Onset  . Hypertension Mother   . Thyroid disease Mother   . Hypertension Father   . Thyroid disease Sister   . Thyroid disease Brother   . Thyroid disease Sister     Allergies  No Known Allergies   Current Outpatient Medications  Medication Sig Dispense Refill  . B Complex Vitamins (VITAMIN-B COMPLEX PO) Take 1 tablet by mouth daily.    . Cholecalciferol (VITAMIN D3) 125 MCG (5000 UT) CAPS Take 1 capsule by mouth daily.    Marland Kitchen diltiazem (TIAZAC) 120 MG 24 hr capsule Take 120 mg by mouth daily.     . feeding supplement, ENSURE COMPLETE, (ENSURE COMPLETE) LIQD  Take 237 mLs by mouth 2 (two) times daily between meals.    . hydrochlorothiazide (HYDRODIURIL) 25 MG tablet Take 25 mg by mouth daily.    . Ipratropium-Albuterol (COMBIVENT RESPIMAT) 20-100 MCG/ACT AERS respimat Inhale 1 puff into the lungs every 6 (six) hours as needed for wheezing or shortness of breath.     Marland Kitchen ipratropium-albuterol (DUONEB) 0.5-2.5 (3) MG/3ML SOLN Take 3 mLs by nebulization every 6 (six) hours as needed. For shortness of breath    . labetalol (NORMODYNE) 300 MG tablet Take 300 mg by mouth 2 (two) times daily.     Marland Kitchen losartan (COZAAR) 100 MG tablet Take 100 mg by mouth daily.    . Multiple Vitamins-Minerals (CENTRUM ADULTS PO) Take 1 tablet by mouth daily.    [name] (OYSTER CALCIUM) 500 MG TABS tablet Take 500 mg of elemental calcium by mouth daily.    . vitamin C (ASCORBIC ACID) 500 MG tablet Take 500 mg by mouth daily.    Marland Kitchen oxyCODONE (OXY IR/ROXICODONE) 5 MG immediate release tablet Take 1 tablet (5 mg total) by mouth every 6 (six) hours as needed for severe pain. 10 tablet 0   No current facility-administered medications for this visit.    ROS:   General:  No weight loss, Fever, chills  HEENT: No recent headaches, no nasal bleeding, no visual changes, no sore throat  Neurologic: No dizziness, blackouts, seizures. No recent symptoms of stroke or mini- stroke. No recent episodes of slurred speech, or temporary blindness. [x]  balance issues of unknown cause.  Cardiac: No recent episodes of chest pain/pressure, no shortness of breath at rest.  No shortness of breath with exertion.  Denies history of atrial fibrillation or irregular heartbeat  Vascular: No history of rest pain in feet.  No history of claudication.  No history of non-healing ulcer, No history of DVT   Pulmonary: No home oxygen, no productive cough, no hemoptysis,  No asthma or wheezing  Musculoskeletal:  [ ]  Arthritis, [ ]  Low back pain,  [ ]  Joint pain  Hematologic:No history of hypercoagulable  state.  No history of easy bleeding.  No history of anemia  Gastrointestinal: No hematochezia or melena,  No gastroesophageal reflux, no trouble swallowing  Urinary: [ ]  chronic Kidney disease, [ ]  on HD - [ ]  MWF or [ ]  TTHS, [ ]  Burning with urination, [ ]  Frequent urination, [ ]  Difficulty urinating;   Skin: No rashes  Psychological: No history of anxiety,  No history of depression   Physical Examination  Vitals:   10/15/19 1132 10/15/19 1134  BP: (!) 195/73 (!) 174/62  Pulse: 60   Resp: 16   Temp: (!) 97.3 F (36.3 C)   TempSrc: Temporal   SpO2: 100%   Weight: 107 lb 1.6 oz (48.6 kg)   Height: 5\' 7"  (1.702 m)     Body mass index is 16.77 kg/m.  General:  Alert and oriented, no acute distress HEENT: Normal Neck: No bruit or JVD Pulmonary: Clear to auscultation bilaterally Cardiac: Regular Rate and Rhythm without murmur Gastrointestinal: Soft, non-tender, non-distended, no mass, no scars Skin: No rash Extremity Pulses:  2+ radial, brachial, femoral, dorsalis pedis, posterior tibial pulses bilaterally Musculoskeletal: No deformity or edema  Neurologic: Upper and lower extremity motor 5/5 and symmetric  DATA:  Right Carotid Findings:  +----------+--------+--------+--------+-------------------------+--------+       PSV cm/sEDV cm/sStenosisPlaque Description    Comments  +----------+--------+--------+--------+-------------------------+--------+  CCA Prox 76   14                          +----------+--------+--------+--------+-------------------------+--------+  CCA Mid  83   15       homogeneous and irregular      +----------+--------+--------+--------+-------------------------+--------+  CCA Distal86   17                          +----------+--------+--------+--------+-------------------------+--------+  ICA Prox 98   23   1-39%  calcific and irregular         +----------+--------+--------+--------+-------------------------+--------+  ICA Mid  117   29                          +----------+--------+--------+--------+-------------------------+--------+  ICA Distal127   27                          +----------+--------+--------+--------+-------------------------+--------+  ECA    131   0                           +----------+--------+--------+--------+-------------------------+--------+   +----------+--------+-------+----------------+-------------------+       PSV cm/sEDV cmsDescribe    Arm Pressure (mmHG)  +----------+--------+-------+----------------+-------------------+  [id number]   0   Multiphasic, WNL            +----------+--------+-------+----------------+-------------------+   +---------+--------+---+--------+--+---------+  VertebralPSV cm/s123EDV cm/s21Antegrade  +---------+--------+---+--------+--+---------+      Left Carotid Findings:  +----------+--------+--------+--------+--------------------------+--------+        PSV cm/sEDV cm/sStenosisPlaque Description      Comments  +----------+--------+--------+--------+--------------------------+--------+   CCA Prox 122   21                            +----------+--------+--------+--------+--------------------------+--------+   CCA Mid  97   14                            +----------+--------+--------+--------+--------------------------+--------+   CCA Distal229   31   >50%  heterogenous and irregular        +----------+--------+--------+--------+--------------------------+--------+   ICA Prox 265   52   40-59% calcific and smooth              +----------+--------+--------+--------+--------------------------+--------+   ICA Mid  175   40                            +----------+--------+--------+--------+--------------------------+--------+   ICA Distal94   25                            +----------+--------+--------+--------+--------------------------+--------+   ECA    161   0                             +----------+--------+--------+--------+--------------------------+--------+    +----------+--------+--------+----------------+-------------------+       PSV cm/sEDV cm/sDescribe    Arm Pressure (mmHG)  +----------+--------+--------+----------------+-------------------+  Subclavian160   0    Multiphasic, WNL            +----------+--------+--------+----------------+-------------------+   +---------+--------+--+--------+--+---------+  VertebralPSV cm/s67EDV cm/s17Antegrade  +---------+--------+--+--------+--+---------+         Summary:  Right Carotid: Velocities in the right ICA are consistent with a 1-39%  stenosis.         Structure identified adjacent to carotid consistent with         previously described goiter.   Left Carotid: Velocities in the left ICA are consistent with a 40-59%  stenosis.        Hemodynamically significant plaque >50% visualized in the  CCA.        Structure identified adjacent to carotid consistent with        previously described goiter.   Vertebrals: Bilateral vertebral arteries demonstrate antegrade flow.  Subclavians: Normal flow hemodynamics were seen in bilateral subclavian        arteries.   ASSESSMENT:  Carotid stenosis essentially unchanged from previous study. Right ICA 1-39% stenosis Left 40-59% stenosis  PLAN: We reviewed signs and symptoms of stroke/TIA.  If these occur  she will call 911.  F/U for repeat carotid duplex.  Of note she was hypertensive in our office today 174/62 and 195/73 her primary care physician is working on BP control per the patient.     Roxy Horseman PA-C [facility] [place] Office: [phone]  MD in [facility]

## 2019-10-17 ENCOUNTER — Other Ambulatory Visit: Payer: Self-pay | Admitting: *Deleted

## 2019-10-17 DIAGNOSIS — I6529 Occlusion and stenosis of unspecified carotid artery: Secondary | ICD-10-CM

## 2019-10-20 DIAGNOSIS — J449 Chronic obstructive pulmonary disease, unspecified: Secondary | ICD-10-CM | POA: Diagnosis not present

## 2019-10-27 DIAGNOSIS — J449 Chronic obstructive pulmonary disease, unspecified: Secondary | ICD-10-CM | POA: Diagnosis not present

## 2019-10-27 DIAGNOSIS — I10 Essential (primary) hypertension: Secondary | ICD-10-CM | POA: Diagnosis not present

## 2019-11-20 DIAGNOSIS — J449 Chronic obstructive pulmonary disease, unspecified: Secondary | ICD-10-CM | POA: Diagnosis not present

## 2019-11-27 DIAGNOSIS — I10 Essential (primary) hypertension: Secondary | ICD-10-CM | POA: Diagnosis not present

## 2019-11-27 DIAGNOSIS — J449 Chronic obstructive pulmonary disease, unspecified: Secondary | ICD-10-CM | POA: Diagnosis not present

## 2019-12-27 DIAGNOSIS — J449 Chronic obstructive pulmonary disease, unspecified: Secondary | ICD-10-CM | POA: Diagnosis not present

## 2019-12-27 DIAGNOSIS — I10 Essential (primary) hypertension: Secondary | ICD-10-CM | POA: Diagnosis not present

## 2020-01-27 DIAGNOSIS — J449 Chronic obstructive pulmonary disease, unspecified: Secondary | ICD-10-CM | POA: Diagnosis not present

## 2020-01-27 DIAGNOSIS — I10 Essential (primary) hypertension: Secondary | ICD-10-CM | POA: Diagnosis not present

## 2020-02-13 DIAGNOSIS — I10 Essential (primary) hypertension: Secondary | ICD-10-CM | POA: Diagnosis not present

## 2020-02-13 DIAGNOSIS — E049 Nontoxic goiter, unspecified: Secondary | ICD-10-CM | POA: Diagnosis not present

## 2020-02-13 DIAGNOSIS — R2681 Unsteadiness on feet: Secondary | ICD-10-CM | POA: Diagnosis not present

## 2020-02-13 DIAGNOSIS — J449 Chronic obstructive pulmonary disease, unspecified: Secondary | ICD-10-CM | POA: Diagnosis not present

## 2020-03-19 DIAGNOSIS — I10 Essential (primary) hypertension: Secondary | ICD-10-CM | POA: Diagnosis not present

## 2020-03-19 DIAGNOSIS — R627 Adult failure to thrive: Secondary | ICD-10-CM | POA: Diagnosis not present

## 2020-03-19 DIAGNOSIS — J449 Chronic obstructive pulmonary disease, unspecified: Secondary | ICD-10-CM | POA: Diagnosis not present

## 2020-03-19 DIAGNOSIS — Z23 Encounter for immunization: Secondary | ICD-10-CM | POA: Diagnosis not present

## 2020-04-19 DIAGNOSIS — I10 Essential (primary) hypertension: Secondary | ICD-10-CM | POA: Diagnosis not present

## 2020-04-19 DIAGNOSIS — J449 Chronic obstructive pulmonary disease, unspecified: Secondary | ICD-10-CM | POA: Diagnosis not present

## 2020-04-22 ENCOUNTER — Ambulatory Visit: Payer: Medicare Other

## 2020-05-25 DIAGNOSIS — Z9114 Patient's other noncompliance with medication regimen: Secondary | ICD-10-CM | POA: Diagnosis not present

## 2020-05-25 DIAGNOSIS — J449 Chronic obstructive pulmonary disease, unspecified: Secondary | ICD-10-CM | POA: Diagnosis not present

## 2020-05-25 DIAGNOSIS — I10 Essential (primary) hypertension: Secondary | ICD-10-CM | POA: Diagnosis not present

## 2020-06-25 DIAGNOSIS — I10 Essential (primary) hypertension: Secondary | ICD-10-CM | POA: Diagnosis not present

## 2020-06-25 DIAGNOSIS — J449 Chronic obstructive pulmonary disease, unspecified: Secondary | ICD-10-CM | POA: Diagnosis not present

## 2020-07-26 DIAGNOSIS — I10 Essential (primary) hypertension: Secondary | ICD-10-CM | POA: Diagnosis not present

## 2020-07-26 DIAGNOSIS — J449 Chronic obstructive pulmonary disease, unspecified: Secondary | ICD-10-CM | POA: Diagnosis not present

## 2020-08-30 DIAGNOSIS — I10 Essential (primary) hypertension: Secondary | ICD-10-CM | POA: Diagnosis not present

## 2020-08-30 DIAGNOSIS — J449 Chronic obstructive pulmonary disease, unspecified: Secondary | ICD-10-CM | POA: Diagnosis not present

## 2020-09-15 DIAGNOSIS — I10 Essential (primary) hypertension: Secondary | ICD-10-CM | POA: Diagnosis not present

## 2020-09-15 DIAGNOSIS — E049 Nontoxic goiter, unspecified: Secondary | ICD-10-CM | POA: Diagnosis not present

## 2020-09-15 DIAGNOSIS — J449 Chronic obstructive pulmonary disease, unspecified: Secondary | ICD-10-CM | POA: Diagnosis not present

## 2020-09-15 DIAGNOSIS — Z0001 Encounter for general adult medical examination with abnormal findings: Secondary | ICD-10-CM | POA: Diagnosis not present

## 2020-09-15 DIAGNOSIS — Z1389 Encounter for screening for other disorder: Secondary | ICD-10-CM | POA: Diagnosis not present

## 2020-10-11 ENCOUNTER — Ambulatory Visit (INDEPENDENT_AMBULATORY_CARE_PROVIDER_SITE_OTHER): Payer: Medicare Other

## 2020-10-11 ENCOUNTER — Other Ambulatory Visit (HOSPITAL_COMMUNITY): Payer: Self-pay | Admitting: Vascular Surgery

## 2020-10-11 ENCOUNTER — Ambulatory Visit: Payer: Medicare Other | Admitting: Vascular Surgery

## 2020-10-11 ENCOUNTER — Encounter: Payer: Self-pay | Admitting: Vascular Surgery

## 2020-10-11 ENCOUNTER — Other Ambulatory Visit: Payer: Self-pay

## 2020-10-11 VITALS — BP 174/62 | HR 60 | Temp 97.9°F | Resp 18 | Ht 67.0 in | Wt 106.0 lb

## 2020-10-11 DIAGNOSIS — I6529 Occlusion and stenosis of unspecified carotid artery: Secondary | ICD-10-CM

## 2020-10-11 NOTE — Progress Notes (Signed)
[facility] [place]  Patient name: Lisa [name] MRN: [id number] DOB: 05/01/1935 Sex: female  REASON FOR VISIT: Follow-up asymptomatic left carotid stenosis  HPI: Lisa [name] is a 86 y.o. female here today for follow-up.  She has a known history of left carotid stenosis which is been asymptomatic.  She has had no new symptoms.  She specifically denies any amaurosis fugax, aphasia, TIA or stroke.  She does walk with a walker due to unsteady gait.  She has no focal deficits.  Past Medical History:  Diagnosis Date  . COPD (chronic obstructive pulmonary disease) ([place])   . Hypertension   . Pyelonephritis 04/06/2014   UTI    Family History  Problem Relation Age of Onset  . Hypertension Mother   . Thyroid disease Mother   . Hypertension Father   . Thyroid disease Sister   . Thyroid disease Brother   . Thyroid disease Sister     SOCIAL HISTORY: Social History   Tobacco Use  . Smoking status: Former Smoker    Types: Cigarettes    Quit date: 05/12/2014    Years since quitting: 6.4  . Smokeless tobacco: Never Used  Substance Use Topics  . Alcohol use: No    No Known Allergies  Current Outpatient Medications  Medication Sig Dispense Refill  . B Complex Vitamins (VITAMIN-B COMPLEX PO) Take 1 tablet by mouth daily.    . Cholecalciferol (VITAMIN D3) 125 MCG (5000 UT) CAPS Take 1 capsule by mouth daily.    Marland Kitchen diltiazem (TIAZAC) 120 MG 24 hr capsule Take 120 mg by mouth daily.     . feeding supplement, ENSURE COMPLETE, (ENSURE COMPLETE) LIQD Take 237 mLs by mouth 2 (two) times daily between meals.    . hydrochlorothiazide (HYDRODIURIL) 25 MG tablet Take 25 mg by mouth daily.    . Ipratropium-Albuterol (COMBIVENT) 20-100 MCG/ACT AERS respimat Inhale 1 puff into the lungs every 6 (six) hours as needed for wheezing or shortness of breath.     Marland Kitchen ipratropium-albuterol (DUONEB) 0.5-2.5 (3) MG/3ML SOLN Take 3 mLs by  nebulization every 6 (six) hours as needed. For shortness of breath    . labetalol (NORMODYNE) 300 MG tablet Take 300 mg by mouth 2 (two) times daily.    Marland Kitchen losartan (COZAAR) 100 MG tablet Take 100 mg by mouth daily.    . Multiple Vitamins-Minerals (CENTRUM ADULTS PO) Take 1 tablet by mouth daily.    [name] (OYSTER CALCIUM) 500 MG TABS tablet Take 500 mg of elemental calcium by mouth daily.    Marland Kitchen oxyCODONE (OXY IR/ROXICODONE) 5 MG immediate release tablet Take 1 tablet (5 mg total) by mouth every 6 (six) hours as needed for severe pain. (Patient not taking: Reported on 10/11/2020) 10 tablet 0  . vitamin C (ASCORBIC ACID) 500 MG tablet Take 500 mg by mouth daily. (Patient not taking: Reported on 10/11/2020)     No current facility-administered medications for this visit.    REVIEW OF SYSTEMS:  [X]  denotes positive finding, [ ]  denotes negative finding Cardiac  Comments:  Chest pain or chest pressure:    Shortness of breath upon exertion:    Short of breath when lying flat:    Irregular heart rhythm:        Vascular    Pain in calf, thigh, or hip brought on by ambulation:    Pain in feet at night that wakes you up from your sleep:  Blood clot in your veins:    Leg swelling:           PHYSICAL EXAM: Vitals:   10/11/20 1013  BP: (!) 174/62  Pulse: 60  Resp: 18  Temp: 97.9 F (36.6 C)  TempSrc: Other (Comment)  SpO2: 99%  Weight: 106 lb (48.1 kg)  Height: 5\' 7"  (1.702 m)    GENERAL: The patient is a well-nourished female, in no acute distress. The vital signs are documented above. CARDIOVASCULAR: Carotid arteries without bruits bilaterally.  2+ radial pulses bilaterally. PULMONARY: There is good air exchange  MUSCULOSKELETAL: There are no major deformities or cyanosis. NEUROLOGIC: No focal weakness or paresthesias are detected. SKIN: There are no ulcers or rashes noted. PSYCHIATRIC: The patient has a normal affect.  DATA:  Carotid duplex today was reviewed with the  patient.  This was compared with her prior study 1 year ago.  She does have a 60 to 79% left internal carotid artery stenosis.  This is at the lower end of the range.  Her prior study revealed 40 to 59% stenosis 1 year ago and this was at the upper end of this range.  She continues to have no significant right carotid stenosis  MEDICAL ISSUES: I discussed this at length with the patient.  I described stroke symptoms to her and she knows to report immediately to the emergency room should this occur.  Explained that she has had slight progression of level of stenosis in her left internal carotid artery.  I have recommended that we see her again in 1 year with repeat duplex.    Rosetta Posner, MD FACS [facility] [phone]  Note: Portions of this report may have been transcribed using voice recognition software.  Every effort has been made to ensure accuracy; however, inadvertent computerized transcription errors may still be present.

## 2020-10-15 DIAGNOSIS — I10 Essential (primary) hypertension: Secondary | ICD-10-CM | POA: Diagnosis not present

## 2020-10-15 DIAGNOSIS — J449 Chronic obstructive pulmonary disease, unspecified: Secondary | ICD-10-CM | POA: Diagnosis not present

## 2020-11-15 DIAGNOSIS — F17200 Nicotine dependence, unspecified, uncomplicated: Secondary | ICD-10-CM | POA: Diagnosis not present

## 2020-11-15 DIAGNOSIS — I10 Essential (primary) hypertension: Secondary | ICD-10-CM | POA: Diagnosis not present

## 2020-12-15 DIAGNOSIS — J449 Chronic obstructive pulmonary disease, unspecified: Secondary | ICD-10-CM | POA: Diagnosis not present

## 2020-12-15 DIAGNOSIS — I10 Essential (primary) hypertension: Secondary | ICD-10-CM | POA: Diagnosis not present

## 2021-01-15 DIAGNOSIS — I10 Essential (primary) hypertension: Secondary | ICD-10-CM | POA: Diagnosis not present

## 2021-01-15 DIAGNOSIS — J449 Chronic obstructive pulmonary disease, unspecified: Secondary | ICD-10-CM | POA: Diagnosis not present

## 2021-02-07 ENCOUNTER — Emergency Department (HOSPITAL_COMMUNITY)
Admission: EM | Admit: 2021-02-07 | Discharge: 2021-02-07 | Disposition: A | Discharge status: Home / Self Care | Payer: Medicare Other | Attending: Emergency Medicine | Admitting: Emergency Medicine

## 2021-02-07 ENCOUNTER — Other Ambulatory Visit: Payer: Self-pay

## 2021-02-07 ENCOUNTER — Encounter (HOSPITAL_COMMUNITY): Payer: Self-pay | Admitting: *Deleted

## 2021-02-07 DIAGNOSIS — Z87891 Personal history of nicotine dependence: Secondary | ICD-10-CM | POA: Insufficient documentation

## 2021-02-07 DIAGNOSIS — Z743 Need for continuous supervision: Secondary | ICD-10-CM | POA: Diagnosis not present

## 2021-02-07 DIAGNOSIS — R6889 Other general symptoms and signs: Secondary | ICD-10-CM | POA: Diagnosis not present

## 2021-02-07 DIAGNOSIS — N179 Acute kidney failure, unspecified: Secondary | ICD-10-CM | POA: Insufficient documentation

## 2021-02-07 DIAGNOSIS — R059 Cough, unspecified: Secondary | ICD-10-CM | POA: Diagnosis not present

## 2021-02-07 DIAGNOSIS — R062 Wheezing: Secondary | ICD-10-CM | POA: Diagnosis not present

## 2021-02-07 DIAGNOSIS — J449 Chronic obstructive pulmonary disease, unspecified: Secondary | ICD-10-CM | POA: Insufficient documentation

## 2021-02-07 DIAGNOSIS — I10 Essential (primary) hypertension: Secondary | ICD-10-CM | POA: Diagnosis not present

## 2021-02-07 DIAGNOSIS — U071 COVID-19: Secondary | ICD-10-CM | POA: Insufficient documentation

## 2021-02-07 DIAGNOSIS — Z79899 Other long term (current) drug therapy: Secondary | ICD-10-CM | POA: Insufficient documentation

## 2021-02-07 DIAGNOSIS — R070 Pain in throat: Secondary | ICD-10-CM | POA: Diagnosis not present

## 2021-02-07 LAB — BASIC METABOLIC PANEL
Anion gap: 5 (ref 5–15)
BUN: 55 mg/dL — ABNORMAL HIGH (ref 8–23)
CO2: 19 mmol/L — ABNORMAL LOW (ref 22–32)
Calcium: 8.8 mg/dL — ABNORMAL LOW (ref 8.9–10.3)
Chloride: 109 mmol/L (ref 98–111)
Creatinine, Ser: 2.07 mg/dL — ABNORMAL HIGH (ref 0.44–1.00)
GFR, Estimated: 23 mL/min — ABNORMAL LOW (ref >60)
Glucose, Bld: 109 mg/dL — ABNORMAL HIGH (ref 70–99)
Potassium: 5.5 mmol/L — ABNORMAL HIGH (ref 3.5–5.1)
Sodium: 133 mmol/L — ABNORMAL LOW (ref 135–145)

## 2021-02-07 LAB — URINALYSIS, ROUTINE W REFLEX MICROSCOPIC
Bilirubin Urine: NEGATIVE
Glucose, UA: NEGATIVE mg/dL
Hgb urine dipstick: NEGATIVE
Ketones, ur: 5 mg/dL — AB
Leukocytes,Ua: NEGATIVE
Nitrite: NEGATIVE
Protein, ur: 30 mg/dL — AB
Specific Gravity, Urine: 1.018 (ref 1.005–1.030)
pH: 5 (ref 5.0–8.0)

## 2021-02-07 LAB — CBC
HCT: 30.5 % — ABNORMAL LOW (ref 36.0–46.0)
Hemoglobin: 9.8 g/dL — ABNORMAL LOW (ref 12.0–15.0)
MCH: 30.6 pg (ref 26.0–34.0)
MCHC: 32.1 g/dL (ref 30.0–36.0)
MCV: 95.3 fL (ref 80.0–100.0)
Platelets: 87 10*3/uL — ABNORMAL LOW (ref 150–400)
RBC: 3.2 MIL/uL — ABNORMAL LOW (ref 3.87–5.11)
RDW: 12.5 % (ref 11.5–15.5)
WBC: 5.2 10*3/uL (ref 4.0–10.5)
nRBC: 0 % (ref 0.0–0.2)

## 2021-02-07 MED ORDER — DIPHENHYDRAMINE HCL 50 MG/ML IJ SOLN: 50.0000 mg | Freq: Once | INTRAMUSCULAR | Status: DC | PRN

## 2021-02-07 MED ORDER — SODIUM CHLORIDE 0.9 % IV BOLUS
1000.0000 mL | Freq: Once | INTRAVENOUS | Status: AC
  Administered 2021-02-07: 1000 mL via INTRAVENOUS

## 2021-02-07 MED ORDER — EPINEPHRINE 0.3 MG/0.3ML IJ SOAJ: 0.3000 mg | Freq: Once | INTRAMUSCULAR | Status: DC | PRN

## 2021-02-07 MED ORDER — METHYLPREDNISOLONE SODIUM SUCC 125 MG IJ SOLR: 125.0000 mg | Freq: Once | INTRAMUSCULAR | Status: DC | PRN

## 2021-02-07 MED ORDER — FAMOTIDINE IN NACL 20-0.9 MG/50ML-% IV SOLN: 20.0000 mg | Freq: Once | INTRAVENOUS | Status: DC | PRN

## 2021-02-07 MED ORDER — BEBTELOVIMAB 175 MG/2 ML IV (EUA)
175.0000 mg | Freq: Once | INTRAMUSCULAR | Status: AC
  Administered 2021-02-07: 175 mg via INTRAVENOUS
  Filled 2021-02-07: qty 2

## 2021-02-07 MED ORDER — SODIUM CHLORIDE 0.9 % IV SOLN: INTRAVENOUS | Status: DC | PRN

## 2021-02-07 MED ORDER — ALBUTEROL SULFATE HFA 108 (90 BASE) MCG/ACT IN AERS
2.0000 | INHALATION_SPRAY | Freq: Once | RESPIRATORY_TRACT | Status: DC | PRN
  Administered 2021-02-07: 2 via RESPIRATORY_TRACT
  Filled 2021-02-07: qty 6.7

## 2021-02-07 NOTE — ED Provider Notes (Signed)
Signed out by Dr Hazle Nordmann, that pt has covid, chem and cbc resulted and that patient is getting ivf, and that patient does not want to be admitted, that UA is pending, and to d/c to home once monoclonal ab infusion done.   On recheck, pt is breathing comfortably, o2 sats 98% room air. Pt tolerating po well, and has received ivf.   I offered admission for pt, re abn labs, etc. - pt says she feels fine, is breathing fine, and requests d/c.   Return precautions provided.      Lajean Saver, MD 02/07/21 [phone]

## 2021-02-07 NOTE — ED Provider Notes (Signed)
[facility] EMERGENCY DEPARTMENT Provider Note   CSN: [id number] Arrival date & time: 02/07/21  8546     History Chief Complaint  Patient presents with   Covid    Lisa [name] is a 86 y.o. female.  HPI  This patient is an 86 year old female with a history of COPD hypertension and recently developed a history of COVID which started yesterday with mild cough, feeling some fatigue but no nausea vomiting or diarrhea.  She does have COPD and that she uses albuterol inhalers fairly frequently.  She states that she has those medications at home, a family member wanted her to be evaluated because of her diagnosis of COVID since they did not know how to handle this at home.  The patient arrives by ambulance transport, she has normal oxygen at 95% on room air, normal heart rate in the 70s, blood pressure is normal and she is afebrile.  She has no chest pain or shortness of breath at this time, she has no swelling of her legs.  Past Medical History:  Diagnosis Date   COPD (chronic obstructive pulmonary disease) ([place])    Hypertension    Pyelonephritis 04/06/2014   UTI    Patient Active Problem List   Diagnosis Date Noted   Abnormal LFTs (liver function tests)    Calculus of gallbladder without cholecystitis without obstruction    Gallstone pancreatitis 09/13/2018   Protein-calorie malnutrition, severe ([place]) 05/09/2014   Sepsis ([place]) 05/07/2014   Encephalopathy 05/07/2014   Back pain 05/07/2014   Acute renal failure syndrome ([place])    Bacteremia    COLD (chronic obstructive lung disease) ([place])    Viridans streptococci infection    H/O goiter 04/10/2014   Moderate protein-calorie malnutrition ([place]) 04/10/2014   Acute encephalopathy 04/09/2014   Streptococcus viridans infection 04/08/2014   Leukocytosis 04/07/2014   Dehydration 04/07/2014   Fever 04/06/2014   UTI (lower urinary tract infection) 04/06/2014   ARF (acute renal failure) ([place]) 04/06/2014   Pyelonephritis, acute  04/06/2014   Hypertension    COPD (chronic obstructive pulmonary disease) ([place])     Past Surgical History:  Procedure Laterality Date   ABDOMINAL HYSTERECTOMY     CHOLECYSTECTOMY N/A 09/17/2018   Procedure: LAPAROSCOPIC CHOLECYSTECTOMY;  Surgeon: Virl Cagey, MD;  Location: [facility]S;  Service: General;  Laterality: N/A;   TEE WITHOUT CARDIOVERSION N/A 04/10/2014   Procedure: TRANSESOPHAGEAL ECHOCARDIOGRAM (TEE);  Surgeon: [name], MD;  Location: [facility] ENDOSCOPY;  Service: Cardiovascular;  Laterality: N/A;     OB History     Gravida      Para      Term      Preterm      AB      Living  0      SAB      IAB      Ectopic      Multiple      Live Births              Family History  Problem Relation Age of Onset   Hypertension Mother    Thyroid disease Mother    Hypertension Father    Thyroid disease Sister    Thyroid disease Brother    Thyroid disease Sister     Social History   Tobacco Use   Smoking status: Former    Types: Cigarettes    Quit date: 05/12/2014    Years since quitting: 6.7   Smokeless tobacco: Never  Vaping Use   Vaping  Use: Never used  Substance Use Topics   Alcohol use: No   Drug use: No    Home Medications Prior to Admission medications   Medication Sig Start Date End Date Taking? Authorizing Provider  B Complex Vitamins (VITAMIN-B COMPLEX PO) Take 1 tablet by mouth daily.    [provider]  Cholecalciferol (VITAMIN D3) 125 MCG (5000 UT) CAPS Take 1 capsule by mouth daily.    [provider]  diltiazem (TIAZAC) 120 MG 24 hr capsule Take 120 mg by mouth daily.  02/05/16   [provider]  feeding supplement, ENSURE COMPLETE, (ENSURE COMPLETE) LIQD Take 237 mLs by mouth 2 (two) times daily between meals. 04/15/14   Mikhail, Velta Addison, DO  hydrochlorothiazide (HYDRODIURIL) 25 MG tablet Take 25 mg by mouth daily. 04/29/18   [provider]  Ipratropium-Albuterol (COMBIVENT) 20-100 MCG/ACT AERS  respimat Inhale 1 puff into the lungs every 6 (six) hours as needed for wheezing or shortness of breath.     [provider]  ipratropium-albuterol (DUONEB) 0.5-2.5 (3) MG/3ML SOLN Take 3 mLs by nebulization every 6 (six) hours as needed. For shortness of breath    [provider]  labetalol (NORMODYNE) 300 MG tablet Take 300 mg by mouth 2 (two) times daily.    [provider]  losartan (COZAAR) 100 MG tablet Take 100 mg by mouth daily. 04/29/18   [provider]  Multiple Vitamins-Minerals (CENTRUM ADULTS PO) Take 1 tablet by mouth daily.    [provider]  oxyCODONE (OXY IR/ROXICODONE) 5 MG immediate release tablet Take 1 tablet (5 mg total) by mouth every 6 (six) hours as needed for severe pain. Patient not taking: Reported on 10/11/2020 09/17/18   Arrien, Jimmy Picket, MD  [name] (OYSTER CALCIUM) 500 MG TABS tablet Take 500 mg of elemental calcium by mouth daily.    [provider]  vitamin C (ASCORBIC ACID) 500 MG tablet Take 500 mg by mouth daily. Patient not taking: Reported on 10/11/2020    [provider]    Allergies    Patient has no known allergies.  Review of Systems   Review of Systems  All other systems reviewed and are negative.  Physical Exam Updated Vital Signs BP (!) 111/53 (BP Location: Right Arm)   Pulse (!) 53   Temp 98.2 F (36.8 C) (Oral)   Resp 16   Ht 1.676 m (5\' 6" )   Wt 49.9 kg   SpO2 96%   BMI 17.75 kg/m   Physical Exam Vitals and nursing note reviewed.  Constitutional:      General: She is not in acute distress.    Appearance: She is well-developed.  HENT:     Head: Normocephalic and atraumatic.     Mouth/Throat:     Pharynx: No oropharyngeal exudate.  Eyes:     General: No scleral icterus.       Right eye: No discharge.        Left eye: No discharge.     Conjunctiva/sclera: Conjunctivae normal.     Pupils: Pupils are equal, round, and reactive to light.  Neck:     Thyroid:  No thyromegaly.     Vascular: No JVD.  Cardiovascular:     Rate and Rhythm: Normal rate and regular rhythm.     Heart sounds: Normal heart sounds. No murmur heard.   No friction rub. No gallop.  Pulmonary:     Effort: Pulmonary effort is normal. No respiratory distress.  Breath sounds: Wheezing present. No rales.  Abdominal:     General: Bowel sounds are normal. There is no distension.     Palpations: Abdomen is soft. There is no mass.     Tenderness: There is no abdominal tenderness.  Musculoskeletal:        General: No tenderness. Normal range of motion.     Cervical back: Normal range of motion and neck supple.  Lymphadenopathy:     Cervical: No cervical adenopathy.  Skin:    General: Skin is warm and dry.     Findings: No erythema or rash.  Neurological:     Mental Status: She is alert.     Coordination: Coordination normal.  Psychiatric:        Behavior: Behavior normal.    ED Results / Procedures / Treatments   Labs (all labs ordered are listed, but only abnormal results are displayed) Labs Reviewed  BASIC METABOLIC PANEL - Abnormal; Notable for the following components:      Result Value   Sodium 133 (*)    Potassium 5.5 (*)    CO2 19 (*)    Glucose, Bld 109 (*)    BUN 55 (*)    Creatinine, Ser 2.07 (*)    Calcium 8.8 (*)    GFR, Estimated 23 (*)    All other components within normal limits  CBC - Abnormal; Notable for the following components:   RBC 3.20 (*)    Hemoglobin 9.8 (*)    HCT 30.5 (*)    Platelets 87 (*)    All other components within normal limits  URINE CULTURE  URINALYSIS, ROUTINE W REFLEX MICROSCOPIC    EKG None  Radiology No results found.  Procedures Procedures   Medications Ordered in ED Medications  0.9 %  sodium chloride infusion (has no administration in time range)  bebtelovimab EUA injection SOLN 175 mg (has no administration in time range)  diphenhydrAMINE (BENADRYL) injection 50 mg (has no administration in time  range)  famotidine (PEPCID) IVPB 20 mg premix (has no administration in time range)  methylPREDNISolone sodium succinate (SOLU-MEDROL) 125 mg/2 mL injection 125 mg (has no administration in time range)  albuterol (VENTOLIN HFA) 108 (90 Base) MCG/ACT inhaler 2 puff (has no administration in time range)  EPINEPHrine (EPI-PEN) injection 0.3 mg (has no administration in time range)  sodium chloride 0.9 % bolus 1,000 mL (has no administration in time range)    ED Course  I have reviewed the triage vital signs and the nursing notes.  Pertinent labs & imaging results that were available during my care of the patient were reviewed by me and considered in my medical decision making (see chart for details).    MDM Rules/Calculators/A&P                           The only abnormal finding on the patient's exam is some wheezing on expiration but she speaks in full sentences.  She will be given an albuterol treatment, check a metabolic panel to see if she qualifies for Paxlovid she does not have any medications that would be contraindications.  Change of shift, care signed out to Dr. Delice Lesch on the follow-up results of urinalysis, the patient is getting 1 L of IV fluids and monoclonal antibodies and states that she wants to go home after that.  She is aware that she has an acute kidney injury and is willing to take fluids by mouth and  follow-up for recheck of her kidney test within 1 week, she understands indications for return.  Final Clinical Impression(s) / ED Diagnoses Final diagnoses:  COVID-19  AKI (acute kidney injury) [facility])    Rx / [place] Orders ED Discharge Orders     None        Noemi Chapel, MD 02/07/21 1517

## 2021-02-07 NOTE — ED Notes (Signed)
Pt waiting in room until family arrives to provide transport home.

## 2021-02-07 NOTE — Discharge Instructions (Addendum)
Your testing shows that you have some dehydrated kidneys, we have given you IV fluids to help, it also shows that you have COVID-19, I would like for you to take the following medications. Make sure to drink plenty of fluids and stay well hydrated.   Albuterol inhaler if you develop more coughing or shortness of breath 2 puffs every 4 hours as needed  You may take over-the-counter DayQuil or NyQuil to help with congestion, sore throat, headache or body aches which may develop  You should have your family doctor recheck your blood work this coming week to make sure that your kidneys are improving, drink plenty of clear liquids over the next week and a half to make sure that you are adequately hydrated  Please return to the emergency department for severe worsening symptoms.

## 2021-02-07 NOTE — ED Triage Notes (Signed)
Pt tested for Covid at home and was positive per pt. Pt c/o cough.  Denies any fever.

## 2021-02-07 NOTE — ED Triage Notes (Signed)
Screening done prior to triage.

## 2021-02-09 LAB — URINE CULTURE: Culture: 10000 — AB

## 2021-02-15 DIAGNOSIS — J449 Chronic obstructive pulmonary disease, unspecified: Secondary | ICD-10-CM | POA: Diagnosis not present

## 2021-02-15 DIAGNOSIS — I10 Essential (primary) hypertension: Secondary | ICD-10-CM | POA: Diagnosis not present

## 2021-03-18 DIAGNOSIS — I10 Essential (primary) hypertension: Secondary | ICD-10-CM | POA: Diagnosis not present

## 2021-03-18 DIAGNOSIS — Z79899 Other long term (current) drug therapy: Secondary | ICD-10-CM | POA: Diagnosis not present

## 2021-03-18 DIAGNOSIS — E049 Nontoxic goiter, unspecified: Secondary | ICD-10-CM | POA: Diagnosis not present

## 2021-03-21 DIAGNOSIS — J449 Chronic obstructive pulmonary disease, unspecified: Secondary | ICD-10-CM | POA: Diagnosis not present

## 2021-03-21 DIAGNOSIS — I129 Hypertensive chronic kidney disease with stage 1 through stage 4 chronic kidney disease, or unspecified chronic kidney disease: Secondary | ICD-10-CM | POA: Diagnosis not present

## 2021-03-21 DIAGNOSIS — R627 Adult failure to thrive: Secondary | ICD-10-CM | POA: Diagnosis not present

## 2021-03-21 DIAGNOSIS — Z23 Encounter for immunization: Secondary | ICD-10-CM | POA: Diagnosis not present

## 2021-03-21 DIAGNOSIS — I10 Essential (primary) hypertension: Secondary | ICD-10-CM | POA: Diagnosis not present

## 2021-04-14 DIAGNOSIS — R600 Localized edema: Secondary | ICD-10-CM | POA: Diagnosis not present

## 2021-04-14 DIAGNOSIS — D631 Anemia in chronic kidney disease: Secondary | ICD-10-CM | POA: Diagnosis not present

## 2021-04-14 DIAGNOSIS — N184 Chronic kidney disease, stage 4 (severe): Secondary | ICD-10-CM | POA: Diagnosis not present

## 2021-04-19 DIAGNOSIS — R945 Abnormal results of liver function studies: Secondary | ICD-10-CM

## 2021-04-21 ENCOUNTER — Other Ambulatory Visit (HOSPITAL_BASED_OUTPATIENT_CLINIC_OR_DEPARTMENT_OTHER): Payer: Self-pay | Admitting: Nephrology

## 2021-04-21 ENCOUNTER — Other Ambulatory Visit (HOSPITAL_COMMUNITY): Payer: Self-pay | Admitting: Nephrology

## 2021-04-21 DIAGNOSIS — E875 Hyperkalemia: Secondary | ICD-10-CM

## 2021-04-21 DIAGNOSIS — N184 Chronic kidney disease, stage 4 (severe): Secondary | ICD-10-CM

## 2021-04-21 DIAGNOSIS — D696 Thrombocytopenia, unspecified: Secondary | ICD-10-CM | POA: Diagnosis not present

## 2021-04-21 DIAGNOSIS — E8722 Chronic metabolic acidosis: Secondary | ICD-10-CM | POA: Diagnosis not present

## 2021-04-21 DIAGNOSIS — U071 COVID-19: Secondary | ICD-10-CM | POA: Diagnosis not present

## 2021-04-21 DIAGNOSIS — D638 Anemia in other chronic diseases classified elsewhere: Secondary | ICD-10-CM | POA: Diagnosis not present

## 2021-04-21 DIAGNOSIS — E871 Hypo-osmolality and hyponatremia: Secondary | ICD-10-CM

## 2021-04-21 DIAGNOSIS — N170 Acute kidney failure with tubular necrosis: Secondary | ICD-10-CM

## 2021-04-21 DIAGNOSIS — R600 Localized edema: Secondary | ICD-10-CM | POA: Diagnosis not present

## 2021-04-21 DIAGNOSIS — E872 Acidosis, unspecified: Secondary | ICD-10-CM

## 2021-04-29 ENCOUNTER — Ambulatory Visit (HOSPITAL_COMMUNITY)
Admission: RE | Admit: 2021-04-29 | Discharge: 2021-04-29 | Disposition: A | Discharge status: Home / Self Care | Payer: Medicare Other | Source: Ambulatory Visit | Attending: Nephrology | Admitting: Nephrology

## 2021-04-29 ENCOUNTER — Other Ambulatory Visit: Payer: Self-pay

## 2021-04-29 DIAGNOSIS — E871 Hypo-osmolality and hyponatremia: Secondary | ICD-10-CM | POA: Insufficient documentation

## 2021-04-29 DIAGNOSIS — D638 Anemia in other chronic diseases classified elsewhere: Secondary | ICD-10-CM | POA: Diagnosis not present

## 2021-04-29 DIAGNOSIS — N189 Chronic kidney disease, unspecified: Secondary | ICD-10-CM | POA: Diagnosis not present

## 2021-04-29 DIAGNOSIS — N184 Chronic kidney disease, stage 4 (severe): Secondary | ICD-10-CM | POA: Diagnosis not present

## 2021-04-29 DIAGNOSIS — D696 Thrombocytopenia, unspecified: Secondary | ICD-10-CM | POA: Insufficient documentation

## 2021-04-29 DIAGNOSIS — E872 Acidosis, unspecified: Secondary | ICD-10-CM | POA: Diagnosis not present

## 2021-04-29 DIAGNOSIS — N170 Acute kidney failure with tubular necrosis: Secondary | ICD-10-CM | POA: Insufficient documentation

## 2021-04-29 DIAGNOSIS — N281 Cyst of kidney, acquired: Secondary | ICD-10-CM | POA: Diagnosis not present

## 2021-04-29 DIAGNOSIS — E875 Hyperkalemia: Secondary | ICD-10-CM | POA: Insufficient documentation

## 2021-04-29 DIAGNOSIS — N179 Acute kidney failure, unspecified: Secondary | ICD-10-CM | POA: Diagnosis not present

## 2021-05-12 DIAGNOSIS — E871 Hypo-osmolality and hyponatremia: Secondary | ICD-10-CM | POA: Diagnosis not present

## 2021-05-12 DIAGNOSIS — Z79899 Other long term (current) drug therapy: Secondary | ICD-10-CM | POA: Diagnosis not present

## 2021-05-12 DIAGNOSIS — D696 Thrombocytopenia, unspecified: Secondary | ICD-10-CM | POA: Diagnosis not present

## 2021-05-12 DIAGNOSIS — E8722 Chronic metabolic acidosis: Secondary | ICD-10-CM | POA: Diagnosis not present

## 2021-05-12 DIAGNOSIS — E875 Hyperkalemia: Secondary | ICD-10-CM | POA: Diagnosis not present

## 2021-05-12 DIAGNOSIS — N184 Chronic kidney disease, stage 4 (severe): Secondary | ICD-10-CM | POA: Diagnosis not present

## 2021-05-12 DIAGNOSIS — N170 Acute kidney failure with tubular necrosis: Secondary | ICD-10-CM | POA: Diagnosis not present

## 2021-05-14 DIAGNOSIS — I10 Essential (primary) hypertension: Secondary | ICD-10-CM | POA: Diagnosis not present

## 2021-05-14 DIAGNOSIS — J449 Chronic obstructive pulmonary disease, unspecified: Secondary | ICD-10-CM | POA: Diagnosis not present

## 2021-05-19 DIAGNOSIS — N170 Acute kidney failure with tubular necrosis: Secondary | ICD-10-CM | POA: Diagnosis not present

## 2021-05-19 DIAGNOSIS — N184 Chronic kidney disease, stage 4 (severe): Secondary | ICD-10-CM | POA: Diagnosis not present

## 2021-05-19 DIAGNOSIS — E875 Hyperkalemia: Secondary | ICD-10-CM | POA: Diagnosis not present

## 2021-05-19 DIAGNOSIS — R600 Localized edema: Secondary | ICD-10-CM | POA: Diagnosis not present

## 2021-05-26 ENCOUNTER — Emergency Department (HOSPITAL_COMMUNITY): Payer: Medicare Other

## 2021-05-26 ENCOUNTER — Other Ambulatory Visit: Payer: Self-pay

## 2021-05-26 ENCOUNTER — Emergency Department (HOSPITAL_COMMUNITY)
Admission: EM | Admit: 2021-05-26 | Discharge: 2021-05-26 | Disposition: A | Discharge status: Home / Self Care | Payer: Medicare Other | Attending: Emergency Medicine | Admitting: Emergency Medicine

## 2021-05-26 ENCOUNTER — Encounter (HOSPITAL_COMMUNITY): Payer: Self-pay | Admitting: *Deleted

## 2021-05-26 DIAGNOSIS — Z87891 Personal history of nicotine dependence: Secondary | ICD-10-CM | POA: Insufficient documentation

## 2021-05-26 DIAGNOSIS — I9589 Other hypotension: Secondary | ICD-10-CM | POA: Diagnosis not present

## 2021-05-26 DIAGNOSIS — J449 Chronic obstructive pulmonary disease, unspecified: Secondary | ICD-10-CM | POA: Diagnosis not present

## 2021-05-26 DIAGNOSIS — J90 Pleural effusion, not elsewhere classified: Secondary | ICD-10-CM | POA: Diagnosis not present

## 2021-05-26 DIAGNOSIS — R531 Weakness: Secondary | ICD-10-CM | POA: Diagnosis present

## 2021-05-26 DIAGNOSIS — I10 Essential (primary) hypertension: Secondary | ICD-10-CM | POA: Insufficient documentation

## 2021-05-26 DIAGNOSIS — D472 Monoclonal gammopathy: Secondary | ICD-10-CM | POA: Diagnosis not present

## 2021-05-26 DIAGNOSIS — D638 Anemia in other chronic diseases classified elsewhere: Secondary | ICD-10-CM | POA: Diagnosis not present

## 2021-05-26 DIAGNOSIS — Z79899 Other long term (current) drug therapy: Secondary | ICD-10-CM | POA: Insufficient documentation

## 2021-05-26 DIAGNOSIS — N281 Cyst of kidney, acquired: Secondary | ICD-10-CM | POA: Diagnosis not present

## 2021-05-26 DIAGNOSIS — R600 Localized edema: Secondary | ICD-10-CM | POA: Diagnosis not present

## 2021-05-26 DIAGNOSIS — N170 Acute kidney failure with tubular necrosis: Secondary | ICD-10-CM | POA: Diagnosis not present

## 2021-05-26 DIAGNOSIS — E875 Hyperkalemia: Secondary | ICD-10-CM | POA: Diagnosis not present

## 2021-05-26 DIAGNOSIS — R0602 Shortness of breath: Secondary | ICD-10-CM | POA: Diagnosis not present

## 2021-05-26 DIAGNOSIS — N184 Chronic kidney disease, stage 4 (severe): Secondary | ICD-10-CM | POA: Diagnosis not present

## 2021-05-26 DIAGNOSIS — I959 Hypotension, unspecified: Secondary | ICD-10-CM | POA: Diagnosis not present

## 2021-05-26 LAB — COMPREHENSIVE METABOLIC PANEL
ALT: 24 U/L (ref 0–44)
AST: 31 U/L (ref 15–41)
Albumin: 3.4 g/dL — ABNORMAL LOW (ref 3.5–5.0)
Alkaline Phosphatase: 42 U/L (ref 38–126)
Anion gap: 8 (ref 5–15)
BUN: 55 mg/dL — ABNORMAL HIGH (ref 8–23)
CO2: 23 mmol/L (ref 22–32)
Calcium: 8.8 mg/dL — ABNORMAL LOW (ref 8.9–10.3)
Chloride: 104 mmol/L (ref 98–111)
Creatinine, Ser: 1.97 mg/dL — ABNORMAL HIGH (ref 0.44–1.00)
GFR, Estimated: 25 mL/min — ABNORMAL LOW (ref >60)
Glucose, Bld: 104 mg/dL — ABNORMAL HIGH (ref 70–99)
Potassium: 4.3 mmol/L (ref 3.5–5.1)
Sodium: 135 mmol/L (ref 135–145)
Total Bilirubin: 0.2 mg/dL — ABNORMAL LOW (ref 0.3–1.2)
Total Protein: 8.3 g/dL — ABNORMAL HIGH (ref 6.5–8.1)

## 2021-05-26 LAB — CBC WITH DIFFERENTIAL/PLATELET
Abs Immature Granulocytes: 0 10*3/uL (ref 0.00–0.07)
Basophils Absolute: 0 10*3/uL (ref 0.0–0.1)
Basophils Relative: 1 %
Eosinophils Absolute: 0.1 10*3/uL (ref 0.0–0.5)
Eosinophils Relative: 2 %
HCT: 31.7 % — ABNORMAL LOW (ref 36.0–46.0)
Hemoglobin: 10.1 g/dL — ABNORMAL LOW (ref 12.0–15.0)
Immature Granulocytes: 0 %
Lymphocytes Relative: 25 %
Lymphs Abs: 0.7 10*3/uL (ref 0.7–4.0)
MCH: 31.2 pg (ref 26.0–34.0)
MCHC: 31.9 g/dL (ref 30.0–36.0)
MCV: 97.8 fL (ref 80.0–100.0)
Monocytes Absolute: 0.2 10*3/uL (ref 0.1–1.0)
Monocytes Relative: 7 %
Neutro Abs: 1.7 10*3/uL (ref 1.7–7.7)
Neutrophils Relative %: 65 %
Platelets: 126 10*3/uL — ABNORMAL LOW (ref 150–400)
RBC: 3.24 MIL/uL — ABNORMAL LOW (ref 3.87–5.11)
RDW: 13.1 % (ref 11.5–15.5)
WBC: 2.7 10*3/uL — ABNORMAL LOW (ref 4.0–10.5)
nRBC: 0 % (ref 0.0–0.2)

## 2021-05-26 MED ORDER — ALBUTEROL SULFATE (2.5 MG/3ML) 0.083% IN NEBU
2.5000 mg | INHALATION_SOLUTION | Freq: Once | RESPIRATORY_TRACT | Status: AC
  Administered 2021-05-26: 2.5 mg via RESPIRATORY_TRACT
  Filled 2021-05-26: qty 3

## 2021-05-26 MED ORDER — IPRATROPIUM-ALBUTEROL 0.5-2.5 (3) MG/3ML IN SOLN
3.0000 mL | Freq: Once | RESPIRATORY_TRACT | Status: AC
  Administered 2021-05-26: 3 mL via RESPIRATORY_TRACT
  Filled 2021-05-26: qty 3

## 2021-05-26 NOTE — ED Triage Notes (Signed)
Referred by PCP for evaluation of low blood pressure, normal reading in triage

## 2021-05-26 NOTE — Discharge Instructions (Signed)
Follow-up with your kidney doctor as planned.  Return if any problem

## 2021-05-26 NOTE — ED Provider Notes (Signed)
[facility] Provider Note   CSN: [id number] Arrival date & time: 05/26/21  1311     History No chief complaint on file.   Lisa [name] is a 86 y.o. female.  Patient was sent over to the emergency department because in her nephrologist office she had low blood pressure.  Patient was not complaining of anything.  Also there was some question about her having a pleural effusion  The history is provided by the patient and medical records. No language interpreter was used.  Weakness Severity:  Mild Onset quality:  Sudden Timing:  Intermittent Progression:  Waxing and waning Chronicity:  New Context: not alcohol use   Relieved by:  Nothing Worsened by:  Nothing Ineffective treatments:  None tried Associated symptoms: no abdominal pain, no chest pain, no cough, no diarrhea, no frequency, no headaches and no seizures       Past Medical History:  Diagnosis Date   COPD (chronic obstructive pulmonary disease) ([place])    Hypertension    Pyelonephritis 04/06/2014   UTI    Patient Active Problem List   Diagnosis Date Noted   Abnormal LFTs (liver function tests)    Calculus of gallbladder without cholecystitis without obstruction    Gallstone pancreatitis 09/13/2018   Protein-calorie malnutrition, severe ([place]) 05/09/2014   Sepsis ([place]) 05/07/2014   Encephalopathy 05/07/2014   Back pain 05/07/2014   Acute renal failure syndrome ([place])    Bacteremia    COLD (chronic obstructive lung disease) ([place])    Viridans streptococci infection    H/O goiter 04/10/2014   Moderate protein-calorie malnutrition ([place]) 04/10/2014   Acute encephalopathy 04/09/2014   Streptococcus viridans infection 04/08/2014   Leukocytosis 04/07/2014   Dehydration 04/07/2014   Fever 04/06/2014   UTI (lower urinary tract infection) 04/06/2014   ARF (acute renal failure) ([place]) 04/06/2014   Pyelonephritis, acute 04/06/2014   Hypertension    COPD (chronic obstructive pulmonary  disease) ([place])     Past Surgical History:  Procedure Laterality Date   ABDOMINAL HYSTERECTOMY     CHOLECYSTECTOMY N/A 09/17/2018   Procedure: LAPAROSCOPIC CHOLECYSTECTOMY;  Surgeon: Virl Cagey, MD;  Location: [facility];  Service: General;  Laterality: N/A;   TEE WITHOUT CARDIOVERSION N/A 04/10/2014   Procedure: TRANSESOPHAGEAL ECHOCARDIOGRAM (TEE);  Surgeon: [name], MD;  Location[facility] ENDOSCOPY;  Service: Cardiovascular;  Laterality: N/A;     OB History     Gravida      Para      Term      Preterm      AB      Living  0      SAB      IAB      Ectopic      Multiple      Live Births              Family History  Problem Relation Age of Onset   Hypertension Mother    Thyroid disease Mother    Hypertension Father    Thyroid disease Sister    Thyroid disease Brother    Thyroid disease Sister     Social History   Tobacco Use   Smoking status: Former    Types: Cigarettes    Quit date: 05/12/2014    Years since quitting: 7.0   Smokeless tobacco: Never  Vaping Use   Vaping Use: Never used  Substance Use Topics   Alcohol use: No   Drug use: No    Home Medications Prior  to Admission medications   Medication Sig Start Date End Date Taking? Authorizing Provider  B Complex Vitamins (VITAMIN-B COMPLEX PO) Take 1 tablet by mouth daily.    [provider]  Cholecalciferol (VITAMIN D3) 125 MCG (5000 UT) CAPS Take 1 capsule by mouth daily.    [provider]  diltiazem (TIAZAC) 120 MG 24 hr capsule Take 120 mg by mouth daily.  02/05/16   [provider]  feeding supplement, ENSURE COMPLETE, (ENSURE COMPLETE) LIQD Take 237 mLs by mouth 2 (two) times daily between meals. 04/15/14   Mikhail, Velta Addison, DO  hydrochlorothiazide (HYDRODIURIL) 25 MG tablet Take 25 mg by mouth daily. 04/29/18   [provider]  Ipratropium-Albuterol (COMBIVENT) 20-100 MCG/ACT AERS respimat Inhale 1 puff into the lungs every 6 (six) hours as needed  for wheezing or shortness of breath.     [provider]  ipratropium-albuterol (DUONEB) 0.5-2.5 (3) MG/3ML SOLN Take 3 mLs by nebulization every 6 (six) hours as needed. For shortness of breath    [provider]  labetalol (NORMODYNE) 300 MG tablet Take 300 mg by mouth 2 (two) times daily.    [provider]  losartan (COZAAR) 100 MG tablet Take 100 mg by mouth daily. 04/29/18   [provider]  Multiple Vitamins-Minerals (CENTRUM ADULTS PO) Take 1 tablet by mouth daily.    [provider]  oxyCODONE (OXY IR/ROXICODONE) 5 MG immediate release tablet Take 1 tablet (5 mg total) by mouth every 6 (six) hours as needed for severe pain. Patient not taking: Reported on 10/11/2020 09/17/18   Arrien, Jimmy Picket, MD  [name] (OYSTER CALCIUM) 500 MG TABS tablet Take 500 mg of elemental calcium by mouth daily.    [provider]  vitamin C (ASCORBIC ACID) 500 MG tablet Take 500 mg by mouth daily. Patient not taking: Reported on 10/11/2020    [provider]    Allergies    Patient has no known allergies.  Review of Systems   Review of Systems  Constitutional:  Negative for appetite change and fatigue.  HENT:  Negative for congestion, ear discharge and sinus pressure.   Eyes:  Negative for discharge.  Respiratory:  Negative for cough.   Cardiovascular:  Negative for chest pain.  Gastrointestinal:  Negative for abdominal pain and diarrhea.  Genitourinary:  Negative for frequency and hematuria.  Musculoskeletal:  Negative for back pain.  Skin:  Negative for rash.  Neurological:  Positive for weakness. Negative for seizures and headaches.  Psychiatric/Behavioral:  Negative for hallucinations.    Physical Exam Updated Vital Signs BP (!) 140/56    Pulse (!) 51    Temp 97.6 F (36.4 C) (Oral)    Resp 18    SpO2 98%   Physical Exam Vitals and nursing note reviewed.  Constitutional:      Appearance: She is well-developed.  HENT:      Head: Normocephalic.     Nose: Nose normal.  Eyes:     General: No scleral icterus.    Conjunctiva/sclera: Conjunctivae normal.  Neck:     Thyroid: No thyromegaly.  Cardiovascular:     Rate and Rhythm: Normal rate and regular rhythm.     Heart sounds: No murmur heard.   No friction rub. No gallop.  Pulmonary:     Breath sounds: No stridor. Wheezing present. No rales.  Chest:     Chest wall: No tenderness.  Abdominal:     General: There is no distension.  Tenderness: There is no abdominal tenderness. There is no rebound.  Musculoskeletal:        General: Normal range of motion.     Cervical back: Neck supple.  Lymphadenopathy:     Cervical: No cervical adenopathy.  Skin:    Findings: No erythema or rash.  Neurological:     Mental Status: She is alert and oriented to person, place, and time.     Motor: No abnormal muscle tone.     Coordination: Coordination normal.  Psychiatric:        Behavior: Behavior normal.    ED Results / Procedures / Treatments   Labs (all labs ordered are listed, but only abnormal results are displayed) Labs Reviewed  CBC WITH DIFFERENTIAL/PLATELET - Abnormal; Notable for the following components:      Result Value   WBC 2.7 (*)    RBC 3.24 (*)    Hemoglobin 10.1 (*)    HCT 31.7 (*)    Platelets 126 (*)    All other components within normal limits  COMPREHENSIVE METABOLIC PANEL - Abnormal; Notable for the following components:   Glucose, Bld 104 (*)    BUN 55 (*)    Creatinine, Ser 1.97 (*)    Calcium 8.8 (*)    Total Protein 8.3 (*)    Albumin 3.4 (*)    Total Bilirubin 0.2 (*)    GFR, Estimated 25 (*)    All other components within normal limits    EKG EKG Interpretation  Date/Time:  Thursday May 26 2021 14:15:55 EST Ventricular Rate:  51 PR Interval:    QRS Duration: 82 QT Interval:  482 QTC Calculation: 444 R Axis:   80 Text Interpretation: Atrial fibrillation with slow ventricular response Minimal voltage  criteria for LVH, may be normal variant ( Sokolow-Lyon ) ST & Marked T-wave abnormality, consider inferolateral ischemia Abnormal ECG Confirmed by Milton Ferguson [phone]) on 05/26/2021 3:30:28 PM  Radiology DG Chest 2 View  Result Date: 05/26/2021 CLINICAL DATA:  Shortness of breath. EXAM: CHEST - 2 VIEW COMPARISON:  01/14/2019 FINDINGS: Evidence for hyperinflation. Evidence for bilateral nipple shadows. No focal airspace disease or pleural effusions. Heart and mediastinum are within normal limits. Atherosclerotic calcifications at the aortic arch. Trachea is midline. IMPRESSION: Hyperinflation without acute findings. Electronically Signed   By: Markus Daft M.D.   On: 05/26/2021 15:05    Procedures Procedures   Medications Ordered in ED Medications  ipratropium-albuterol (DUONEB) 0.5-2.5 (3) MG/3ML nebulizer solution 3 mL (3 mLs Nebulization Given 05/26/21 1426)  albuterol (PROVENTIL) (2.5 MG/3ML) 0.083% nebulizer solution 2.5 mg (2.5 mg Nebulization Given 05/26/21 1426)    ED Course  I have reviewed the triage vital signs and the nursing notes.  Pertinent labs & imaging results that were available during my care of the patient were reviewed by me and considered in my medical decision making (see chart for details).    MDM Rules/Calculators/A&P                         Patient with kidney disease.  Her blood pressure was fine here and her chest x-ray did not show any pleural effusion patient did have some wheezing that was treated with albuterol.  She does have a history of COPD.  She will be discharged home to follow-up with her nephrologist    Final Clinical Impression(s) / ED Diagnoses Final diagnoses:  Other specified hypotension    Rx / DC Orders ED Discharge Orders  None        Milton Ferguson, MD 05/31/21 [phone]

## 2021-06-08 ENCOUNTER — Encounter (INDEPENDENT_AMBULATORY_CARE_PROVIDER_SITE_OTHER): Payer: Self-pay | Admitting: *Deleted

## 2021-06-09 ENCOUNTER — Other Ambulatory Visit (HOSPITAL_COMMUNITY): Payer: Self-pay | Admitting: Nephrology

## 2021-06-09 DIAGNOSIS — N170 Acute kidney failure with tubular necrosis: Secondary | ICD-10-CM

## 2021-06-14 ENCOUNTER — Ambulatory Visit (HOSPITAL_COMMUNITY)
Admission: RE | Admit: 2021-06-14 | Discharge: 2021-06-14 | Disposition: A | Discharge status: Home / Self Care | Payer: Medicare Other | Source: Ambulatory Visit | Attending: Internal Medicine | Admitting: Internal Medicine

## 2021-06-14 ENCOUNTER — Other Ambulatory Visit: Payer: Self-pay

## 2021-06-14 DIAGNOSIS — N170 Acute kidney failure with tubular necrosis: Secondary | ICD-10-CM

## 2021-06-14 DIAGNOSIS — I3481 Nonrheumatic mitral (valve) annulus calcification: Secondary | ICD-10-CM | POA: Insufficient documentation

## 2021-06-14 DIAGNOSIS — I129 Hypertensive chronic kidney disease with stage 1 through stage 4 chronic kidney disease, or unspecified chronic kidney disease: Secondary | ICD-10-CM | POA: Diagnosis not present

## 2021-06-14 DIAGNOSIS — I9589 Other hypotension: Secondary | ICD-10-CM | POA: Diagnosis not present

## 2021-06-14 DIAGNOSIS — I10 Essential (primary) hypertension: Secondary | ICD-10-CM | POA: Insufficient documentation

## 2021-06-14 DIAGNOSIS — J449 Chronic obstructive pulmonary disease, unspecified: Secondary | ICD-10-CM | POA: Insufficient documentation

## 2021-06-14 LAB — ECHOCARDIOGRAM COMPLETE
AR max vel: 1.73 cm2
AV Area VTI: 1.6 cm2
AV Area mean vel: 1.75 cm2
AV Mean grad: 3.5 mmHg
AV Peak grad: 7 mmHg
Ao pk vel: 1.32 m/s
Area-P 1/2: 3.03 cm2
S' Lateral: 1.9 cm

## 2021-06-14 NOTE — Progress Notes (Signed)
*  PRELIMINARY RESULTS* Echocardiogram 2D Echocardiogram has been performed.  Lisa [name] 06/14/2021, 10:12 AM

## 2021-06-21 ENCOUNTER — Inpatient Hospital Stay (HOSPITAL_COMMUNITY): Payer: Medicare Other | Attending: Hematology | Admitting: Hematology

## 2021-06-21 ENCOUNTER — Ambulatory Visit (HOSPITAL_COMMUNITY)
Admission: RE | Admit: 2021-06-21 | Discharge: 2021-06-21 | Disposition: A | Discharge status: Home / Self Care | Payer: Medicare Other | Source: Ambulatory Visit | Attending: Hematology | Admitting: Hematology

## 2021-06-21 ENCOUNTER — Other Ambulatory Visit: Payer: Self-pay

## 2021-06-21 ENCOUNTER — Other Ambulatory Visit (HOSPITAL_COMMUNITY): Payer: Medicare Other

## 2021-06-21 VITALS — BP 105/51 | HR 65 | Temp 96.0°F | Resp 22 | Ht 64.96 in | Wt 105.4 lb

## 2021-06-21 DIAGNOSIS — Z801 Family history of malignant neoplasm of trachea, bronchus and lung: Secondary | ICD-10-CM

## 2021-06-21 DIAGNOSIS — D472 Monoclonal gammopathy: Secondary | ICD-10-CM | POA: Insufficient documentation

## 2021-06-21 DIAGNOSIS — D649 Anemia, unspecified: Secondary | ICD-10-CM | POA: Insufficient documentation

## 2021-06-21 DIAGNOSIS — I10 Essential (primary) hypertension: Secondary | ICD-10-CM | POA: Diagnosis not present

## 2021-06-21 DIAGNOSIS — E875 Hyperkalemia: Secondary | ICD-10-CM | POA: Diagnosis not present

## 2021-06-21 DIAGNOSIS — Z79899 Other long term (current) drug therapy: Secondary | ICD-10-CM | POA: Diagnosis not present

## 2021-06-21 DIAGNOSIS — F17210 Nicotine dependence, cigarettes, uncomplicated: Secondary | ICD-10-CM | POA: Diagnosis not present

## 2021-06-21 DIAGNOSIS — J449 Chronic obstructive pulmonary disease, unspecified: Secondary | ICD-10-CM | POA: Diagnosis not present

## 2021-06-21 DIAGNOSIS — Z90710 Acquired absence of both cervix and uterus: Secondary | ICD-10-CM

## 2021-06-21 DIAGNOSIS — Z8616 Personal history of COVID-19: Secondary | ICD-10-CM | POA: Diagnosis not present

## 2021-06-21 DIAGNOSIS — N184 Chronic kidney disease, stage 4 (severe): Secondary | ICD-10-CM | POA: Diagnosis not present

## 2021-06-21 DIAGNOSIS — N170 Acute kidney failure with tubular necrosis: Secondary | ICD-10-CM | POA: Diagnosis not present

## 2021-06-21 DIAGNOSIS — Z800 Family history of malignant neoplasm of digestive organs: Secondary | ICD-10-CM | POA: Diagnosis not present

## 2021-06-21 DIAGNOSIS — Z7982 Long term (current) use of aspirin: Secondary | ICD-10-CM

## 2021-06-21 DIAGNOSIS — M899 Disorder of bone, unspecified: Secondary | ICD-10-CM | POA: Diagnosis not present

## 2021-06-21 DIAGNOSIS — Z8042 Family history of malignant neoplasm of prostate: Secondary | ICD-10-CM

## 2021-06-21 NOTE — Progress Notes (Signed)
[place], [place] [address]   CLINIC:  Medical Oncology/Hematology  Patient Care Team: [name], MD as PCP - General (Internal Medicine)  CHIEF COMPLAINTS/PURPOSE OF CONSULTATION:  Evaluation of MGUS  HISTORY OF PRESENTING ILLNESS:  Lisa [name] 87 y.o. female is here because of evaluation of MGUS, at the request of Dr. Theador Hawthorne.  Today she reports feeling, and she is accompanied by her brother. She denies unexpected weight loss. She denies recent fractures, fevers, and night sweats. She reports a Covid infection in 2022. She currently walks with the assistance of a walker following a severe infection in 2015 for which she was hospitalized for 2 weeks. She denies neuropathy. She reports occasional cramps in her hands bilaterally. She denies n/v/d/c, and she denies current bleeding.   She currently lives at home with her brother and nephew. Prior to retirement she worked at [employer] in [place]. She currently smoked, and she has smoked 1/2 ppd for 66 years. Her brother has prostate cancer, her sister had lung cancer, another sister had colon cancer, her other brother had gallbladder cancer. She denies family history of myeloma.   MEDICAL HISTORY:  Past Medical History:  Diagnosis Date   COPD (chronic obstructive pulmonary disease) ([place])    Hypertension    Pyelonephritis 04/06/2014   UTI    SURGICAL HISTORY: Past Surgical History:  Procedure Laterality Date   ABDOMINAL HYSTERECTOMY     CHOLECYSTECTOMY N/A 09/17/2018   Procedure: LAPAROSCOPIC CHOLECYSTECTOMY;  Surgeon: Virl Cagey, MD;  Location: [facility];  Service: General;  Laterality: N/A;   TEE WITHOUT CARDIOVERSION N/A 04/10/2014   Procedure: TRANSESOPHAGEAL ECHOCARDIOGRAM (TEE);  Surgeon: [name], MD;  Location: [facility] ENDOSCOPY;  Service: Cardiovascular;  Laterality: N/A;    SOCIAL HISTORY: Social History   Socioeconomic History   Marital status: Widowed    Spouse  name: Not on file   Number of children: Not on file   Years of education: Not on file   Highest education level: Not on file  Occupational History   Not on file  Tobacco Use   Smoking status: Former    Types: Cigarettes    Quit date: 05/12/2014    Years since quitting: 7.1   Smokeless tobacco: Never  Vaping Use   Vaping Use: Never used  Substance and Sexual Activity   Alcohol use: No   Drug use: No   Sexual activity: Not on file  Other Topics Concern   Not on file  Social History Narrative   Not on file   Social Determinants of Health   Financial Resource Strain: Not on file  Food Insecurity: Not on file  Transportation Needs: Not on file  Physical Activity: Not on file  Stress: Not on file  Social Connections: Not on file  Intimate Partner Violence: Not on file    FAMILY HISTORY: Family History  Problem Relation Age of Onset   Hypertension Mother    Thyroid disease Mother    Hypertension Father    Thyroid disease Sister    Thyroid disease Brother    Thyroid disease Sister     ALLERGIES:  has No Known Allergies.  MEDICATIONS:  Current Outpatient Medications  Medication Sig Dispense Refill   aspirin 81 MG EC tablet Take by mouth.     calcium carbonate (OS-CAL - DOSED IN MG OF ELEMENTAL CALCIUM) 1250 (500 Ca) MG tablet Take by mouth.     Cholecalciferol (VITAMIN D3) 125 MCG (5000  UT) CAPS Take 1 capsule by mouth daily.     diltiazem (CARDIZEM CD) 120 MG 24 hr capsule Take 120 mg by mouth daily.     feeding supplement, ENSURE COMPLETE, (ENSURE COMPLETE) LIQD Take 237 mLs by mouth 2 (two) times daily between meals.     furosemide (LASIX) 20 MG tablet Take 1 tablet by mouth 2 (two) times daily.     Ipratropium-Albuterol (COMBIVENT) 20-100 MCG/ACT AERS respimat Inhale 1 puff into the lungs every 6 (six) hours as needed for wheezing or shortness of breath.      ipratropium-albuterol (DUONEB) 0.5-2.5 (3) MG/3ML SOLN Take 3 mLs by nebulization every 6 (six) hours as  needed. For shortness of breath     labetalol (NORMODYNE) 300 MG tablet Take 300 mg by mouth 2 (two) times daily.     vitamin C (ASCORBIC ACID) 500 MG tablet Take 500 mg by mouth daily.     No current facility-administered medications for this visit.    REVIEW OF SYSTEMS:   Review of Systems  Constitutional:  Positive for fatigue. Negative for appetite change, fever and unexpected weight change.  HENT:   Negative for nosebleeds.   Respiratory:  Positive for shortness of breath. Negative for hemoptysis.   Gastrointestinal:  Negative for blood in stool, constipation, diarrhea, nausea and vomiting.  Genitourinary:  Negative for hematuria and menstrual problem.   Musculoskeletal:  Positive for myalgias (cramps in hands).  Neurological:  Positive for dizziness.  Hematological:  Does not bruise/bleed easily.  All other systems reviewed and are negative.   PHYSICAL EXAMINATION: ECOG PERFORMANCE STATUS: 2 - Symptomatic, <50% confined to bed  Vitals:   06/21/21 1303  BP: (!) 105/51  Pulse: 65  Resp: (!) 22  Temp: (!) 96 F (35.6 C)  SpO2: 93%   Filed Weights   06/21/21 1303  Weight: 105 lb 6.4 oz (47.8 kg)   Physical Exam Vitals reviewed.  Constitutional:      Appearance: Normal appearance.  Cardiovascular:     Rate and Rhythm: Normal rate and regular rhythm.     Pulses: Normal pulses.     Heart sounds: Normal heart sounds.  Pulmonary:     Effort: Pulmonary effort is normal.     Breath sounds: Normal breath sounds.  Musculoskeletal:     Right lower leg: 1+ Edema present.     Left lower leg: 1+ Edema present.  Neurological:     General: No focal deficit present.     Mental Status: She is alert and oriented to person, place, and time.  Psychiatric:        Mood and Affect: Mood normal.        Behavior: Behavior normal.     LABORATORY DATA:  I have reviewed the data as listed Recent Results (from the past 2160 hour(s))  CBC with Differential/Platelet     Status:  Abnormal   Collection Time: 05/26/21  2:17 PM  Result Value Ref Range   WBC 2.7 (L) 4.0 - 10.5 K/uL   RBC 3.24 (L) 3.87 - 5.11 MIL/uL   Hemoglobin 10.1 (L) 12.0 - 15.0 g/dL   HCT 31.7 (L) 36.0 - 46.0 %   MCV 97.8 80.0 - 100.0 fL   MCH 31.2 26.0 - 34.0 pg   MCHC 31.9 30.0 - 36.0 g/dL   RDW 13.1 11.5 - 15.5 %   Platelets 126 (L) 150 - 400 K/uL   nRBC 0.0 0.0 - 0.2 %   Neutrophils Relative % 65 %  Neutro Abs 1.7 1.7 - 7.7 K/uL   Lymphocytes Relative 25 %   Lymphs Abs 0.7 0.7 - 4.0 K/uL   Monocytes Relative 7 %   Monocytes Absolute 0.2 0.1 - 1.0 K/uL   Eosinophils Relative 2 %   Eosinophils Absolute 0.1 0.0 - 0.5 K/uL   Basophils Relative 1 %   Basophils Absolute 0.0 0.0 - 0.1 K/uL   Immature Granulocytes 0 %   Abs Immature Granulocytes 0.00 0.00 - 0.07 K/uL    Comment: Performed at [facility], [address] [place], [place] [address]  Comprehensive metabolic panel     Status: Abnormal   Collection Time: 05/26/21  2:17 PM  Result Value Ref Range   Sodium 135 135 - 145 mmol/L   Potassium 4.3 3.5 - 5.1 mmol/L   Chloride 104 98 - 111 mmol/L   CO2 23 22 - 32 mmol/L   Glucose, Bld 104 (H) 70 - 99 mg/dL    Comment: Glucose reference range applies only to samples taken after fasting for at least 8 hours.   BUN 55 (H) 8 - 23 mg/dL   Creatinine, Ser 1.97 (H) 0.44 - 1.00 mg/dL   Calcium 8.8 (L) 8.9 - 10.3 mg/dL   Total Protein 8.3 (H) 6.5 - 8.1 g/dL   Albumin 3.4 (L) 3.5 - 5.0 g/dL   AST 31 15 - 41 U/L   ALT 24 0 - 44 U/L   Alkaline Phosphatase 42 38 - 126 U/L   Total Bilirubin 0.2 (L) 0.3 - 1.2 mg/dL   GFR, Estimated 25 (L) >60 mL/min    Comment: (NOTE) Calculated using the CKD-EPI Creatinine Equation (2021)    Anion gap 8 5 - 15    Comment: Performed at [facility], [address], [place], [place] [address]  ECHOCARDIOGRAM COMPLETE     Status: None   Collection Time: 06/14/21 10:11 AM  Result Value Ref Range   Area-P 1/2 3.03 cm2   S' Lateral 1.90 cm   AR max vel 1.73  cm2   AV Area mean vel 1.75 cm2   AV Area VTI 1.60 cm2   AV Peak grad 7.0 mmHg   Ao pk vel 1.32 m/s   AV Mean grad 3.5 mmHg    RADIOGRAPHIC STUDIES: I have personally reviewed the radiological images as listed and agreed with the findings in the report. DG Chest 2 View  Result Date: 05/26/2021 CLINICAL DATA:  Shortness of breath. EXAM: CHEST - 2 VIEW COMPARISON:  01/14/2019 FINDINGS: Evidence for hyperinflation. Evidence for bilateral nipple shadows. No focal airspace disease or pleural effusions. Heart and mediastinum are within normal limits. Atherosclerotic calcifications at the aortic arch. Trachea is midline. IMPRESSION: Hyperinflation without acute findings. Electronically Signed   By: Markus Daft M.D.   On: 05/26/2021 15:05   ECHOCARDIOGRAM COMPLETE  Result Date: 06/14/2021    ECHOCARDIOGRAM REPORT   Patient Name:   Lisa [name] Date of Exam: 06/14/2021 Medical Rec #:  [id number]          Height:       66.0 in Accession #:    [id number]         Weight:       110.0 lb Date of Birth:  1935/05/05           BSA:          1.551 m Patient Age:    27 years           BP:  108/62 mmHg Patient Gender: F                  HR:           66 bpm. Exam Location:  [facility] Procedure: 2D Echo, Cardiac Doppler and Color Doppler Indications:    N17.0 (ICD-10-CM) - Acute kidney failure with lesion of tubular                 necrosis  History:        Patient has prior history of Echocardiogram examinations. COPD;                 Risk Factors:Hypertension. Hx of goiter.  Sonographer:    Alvino Chapel RCS Referring Phys: IXL  1. Left ventricular ejection fraction, by estimation, is 60 to 65%. The left ventricle has normal function. The left ventricle has no regional wall motion abnormalities. There is mild left ventricular hypertrophy. Left ventricular diastolic parameters are consistent with Grade II diastolic dysfunction (pseudonormalization). Elevated left atrial  pressure.  2. Right ventricular systolic function is normal. The right ventricular size is normal. There is mildly elevated pulmonary artery systolic pressure.  3. Left atrial size was severely dilated.  4. The mitral valve is abnormal. Trivial mitral valve regurgitation. No evidence of mitral stenosis. Moderate mitral annular calcification.  5. The aortic valve is tricuspid. Aortic valve regurgitation is not visualized. No aortic stenosis is present.  6. The inferior vena cava is dilated in size with >50% respiratory variability, suggesting right atrial pressure of 8 mmHg. FINDINGS  Left Ventricle: Left ventricular ejection fraction, by estimation, is 60 to 65%. The left ventricle has normal function. The left ventricle has no regional wall motion abnormalities. The left ventricular internal cavity size was normal in size. There is  mild left ventricular hypertrophy. Left ventricular diastolic parameters are consistent with Grade II diastolic dysfunction (pseudonormalization). Elevated left atrial pressure. Right Ventricle: The right ventricular size is normal. No increase in right ventricular wall thickness. Right ventricular systolic function is normal. There is mildly elevated pulmonary artery systolic pressure. The tricuspid regurgitant velocity is 2.88  m/s, and with an assumed right atrial pressure of 8 mmHg, the estimated right ventricular systolic pressure is 51.8 mmHg. Left Atrium: Left atrial size was severely dilated. Right Atrium: Right atrial size was normal in size. Pericardium: There is no evidence of pericardial effusion. Mitral Valve: The mitral valve is abnormal. There is mild thickening of the mitral valve leaflet(s). There is mild calcification of the mitral valve leaflet(s). Moderate mitral annular calcification. Trivial mitral valve regurgitation. No evidence of mitral valve stenosis. Tricuspid Valve: The tricuspid valve is normal in structure. Tricuspid valve regurgitation is mild . No  evidence of tricuspid stenosis. Aortic Valve: The aortic valve is tricuspid. Aortic valve regurgitation is not visualized. No aortic stenosis is present. Aortic valve mean gradient measures 3.5 mmHg. Aortic valve peak gradient measures 7.0 mmHg. Aortic valve area, by VTI measures 1.60 cm. Pulmonic Valve: The pulmonic valve was not well visualized. Pulmonic valve regurgitation is not visualized. No evidence of pulmonic stenosis. Aorta: The aortic root is normal in size and structure. Venous: The inferior vena cava is dilated in size with greater than 50% respiratory variability, suggesting right atrial pressure of 8 mmHg. IAS/Shunts: No atrial level shunt detected by color flow Doppler.  LEFT VENTRICLE PLAX 2D LVIDd:         4.10 cm   Diastology LVIDs:  1.90 cm   LV e' medial:    4.03 cm/s LV PW:         1.10 cm   LV E/e' medial:  29.5 LV IVS:        1.00 cm   LV e' lateral:   5.55 cm/s LVOT diam:     1.70 cm   LV E/e' lateral: 21.4 LV SV:         57 LV SV Index:   37 LVOT Area:     2.27 cm  RIGHT VENTRICLE RV S prime:     10.30 cm/s TAPSE (M-mode): 2.0 cm LEFT ATRIUM             Index        RIGHT ATRIUM           Index LA diam:        3.90 cm 2.51 cm/m   RA Area:     12.40 cm LA Vol (A2C):   83.0 ml 53.51 ml/m  RA Volume:   27.10 ml  17.47 ml/m LA Vol (A4C):   78.6 ml 50.67 ml/m LA Biplane Vol: 86.2 ml 55.57 ml/m  AORTIC VALVE AV Area (Vmax):    1.73 cm AV Area (Vmean):   1.75 cm AV Area (VTI):     1.60 cm AV Vmax:           132.48 cm/s AV Vmean:          86.259 cm/s AV VTI:            0.355 m AV Peak Grad:      7.0 mmHg AV Mean Grad:      3.5 mmHg LVOT Vmax:         101.00 cm/s LVOT Vmean:        66.500 cm/s LVOT VTI:          0.251 m LVOT/AV VTI ratio: 0.71  AORTA Ao Root diam: 2.90 cm MITRAL VALVE                TRICUSPID VALVE MV Area (PHT): 3.03 cm     TR Peak grad:   33.2 mmHg MV Decel Time: 250 msec     TR Vmax:        288.00 cm/s MV E velocity: 119.00 cm/s MV A velocity: 73.70 cm/s    SHUNTS MV E/A ratio:  1.61         Systemic VTI:  0.25 m                             Systemic Diam: 1.70 cm Carlyle Dolly MD Electronically signed by Carlyle Dolly MD Signature Date/Time: 06/14/2021/11:36:33 AM    Final     ASSESSMENT:  IgG kappa monoclonal gammopathy: - Patient seen at the request of Dr. Theador Hawthorne for monoclonal gammopathy. - Labs on 05/12/2021 showed M spike 2.4 g.  Immunofixation shows IgG kappa monoclonal protein with a faint IgA lambda monoclonal protein. - Kappa light chains 143, lambda light chains 38, ratio of 3.72. - 24-hour urine total protein 84 mg.  Urine immunofixation shows faint IgG kappa. - No B symptoms or neuropathy.  Social/family history: - She lives with her nephew.  She is seen with her brother today.  She uses walker to ambulate. 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 - Current active smoker, half pack per day for 62 years.   PLAN:  IgG kappa  monoclonal gammopathy: - I have reviewed labs from Dr. Toya Smothers office. - I have called and talked to patient's niece over the phone and discussed with diagnosis and further work-up plan. - Recommend repeating myeloma labs along with LDH and beta-2 microglobulin. - Recommend skeletal survey to evaluate for lytic lesions. - Recommend bone marrow aspiration and biopsy with specimen sent for chromosome analysis and myeloma FISH panel. - Will consider PET CT scan based on skeletal survey and a bone marrow biopsy. - RTC 2 weeks after the bone marrow biopsy to discuss results.   All questions were answered. The patient knows to call the clinic with any problems, questions or concerns.  Derek Jack, MD 06/21/21 1:38 PM  [place] [phone]   I, Thana Ates, am acting as a scribe for Dr. Derek Jack.  I, Derek Jack MD, have reviewed the above documentation for accuracy and completeness, and I agree with the above.

## 2021-06-21 NOTE — Patient Instructions (Addendum)
[facility] Discharge Instructions   You were seen and examined today by Dr. Delton Coombes.  He discussed the results of your blood work - it is suspicious for a blood cancer called Multiple Myeloma.  We will arrange for you to have further testing to confirm this diagnosis.  Those tests are a bone marrow biopsy, additional blood work, a special bone scan of your body, and a PET scan.  We will see you back in the clinic after these tests have been completed to review the results with you. If necessary, we will devise a treatment plan at that time.    Thank you for choosing [place] [facility] to provide your oncology and hematology care.  To afford each patient quality time with our provider, please arrive at least 15 minutes before your scheduled appointment time.   If you have a lab appointment with the [facility] please come in thru the Main Entrance and check in at the main information desk.  You need to re-schedule your appointment should you arrive 10 or more minutes late.  We strive to give you quality time with our providers, and arriving late affects you and other patients whose appointments are after yours.  Also, if you no show three or more times for appointments you may be dismissed from the clinic at the providers discretion.     Again, thank you for choosing [facility].  Our hope is that these requests will decrease the amount of time that you wait before being seen by our physicians.       _____________________________________________________________  Should you have questions after your visit to [facility], please contact our office at [phone] and follow the prompts.  Our office hours are 8:00 a.m. and 4:30 p.m. Monday - Friday.  Please note that voicemails left after 4:00 p.m. may not be returned until the following business day.  We are closed weekends and major holidays.  You do  have access to a nurse 24-7, just call the main number to the clinic [phone] and do not press any options, hold on the line and a nurse will answer the phone.    For prescription refill requests, have your pharmacy contact our office and allow 72 hours.    Due to Covid, you will need to wear a mask upon entering the hospital. If you do not have a mask, a mask will be given to you at the Main Entrance upon arrival. For doctor visits, patients may have 1 support person age 62 or older with them. For treatment visits, patients can not have anyone with them due to social distancing guidelines and our immunocompromised population.

## 2021-06-23 ENCOUNTER — Other Ambulatory Visit: Payer: Self-pay

## 2021-06-23 ENCOUNTER — Inpatient Hospital Stay (HOSPITAL_COMMUNITY): Payer: Medicare Other

## 2021-06-23 LAB — IRON AND TIBC
Iron: 45 ug/dL (ref 28–170)
Saturation Ratios: 14 % (ref 10.4–31.8)
TIBC: 333 ug/dL (ref 250–450)
UIBC: 288 ug/dL

## 2021-06-23 LAB — CBC WITH DIFFERENTIAL/PLATELET
Abs Immature Granulocytes: 0.01 10*3/uL (ref 0.00–0.07)
Basophils Absolute: 0 10*3/uL (ref 0.0–0.1)
Basophils Relative: 0 %
Eosinophils Absolute: 0 10*3/uL (ref 0.0–0.5)
Eosinophils Relative: 1 %
HCT: 33.7 % — ABNORMAL LOW (ref 36.0–46.0)
Hemoglobin: 10.6 g/dL — ABNORMAL LOW (ref 12.0–15.0)
Immature Granulocytes: 0 %
Lymphocytes Relative: 18 %
Lymphs Abs: 0.6 10*3/uL — ABNORMAL LOW (ref 0.7–4.0)
MCH: 30.8 pg (ref 26.0–34.0)
MCHC: 31.5 g/dL (ref 30.0–36.0)
MCV: 98 fL (ref 80.0–100.0)
Monocytes Absolute: 0.3 10*3/uL (ref 0.1–1.0)
Monocytes Relative: 9 %
Neutro Abs: 2.4 10*3/uL (ref 1.7–7.7)
Neutrophils Relative %: 72 %
Platelets: 127 10*3/uL — ABNORMAL LOW (ref 150–400)
RBC: 3.44 MIL/uL — ABNORMAL LOW (ref 3.87–5.11)
RDW: 12.5 % (ref 11.5–15.5)
WBC: 3.4 10*3/uL — ABNORMAL LOW (ref 4.0–10.5)
nRBC: 0 % (ref 0.0–0.2)

## 2021-06-23 LAB — COMPREHENSIVE METABOLIC PANEL
ALT: 19 U/L (ref 0–44)
AST: 27 U/L (ref 15–41)
Albumin: 3.4 g/dL — ABNORMAL LOW (ref 3.5–5.0)
Alkaline Phosphatase: 54 U/L (ref 38–126)
Anion gap: 6 (ref 5–15)
BUN: 46 mg/dL — ABNORMAL HIGH (ref 8–23)
CO2: 25 mmol/L (ref 22–32)
Calcium: 9.3 mg/dL (ref 8.9–10.3)
Chloride: 108 mmol/L (ref 98–111)
Creatinine, Ser: 1.89 mg/dL — ABNORMAL HIGH (ref 0.44–1.00)
GFR, Estimated: 26 mL/min — ABNORMAL LOW (ref >60)
Glucose, Bld: 96 mg/dL (ref 70–99)
Potassium: 4.5 mmol/L (ref 3.5–5.1)
Sodium: 139 mmol/L (ref 135–145)
Total Bilirubin: 0.4 mg/dL (ref 0.3–1.2)
Total Protein: 8.5 g/dL — ABNORMAL HIGH (ref 6.5–8.1)

## 2021-06-23 LAB — LACTATE DEHYDROGENASE: LDH: 136 U/L (ref 98–192)

## 2021-06-23 LAB — CREATININE, URINE, RANDOM: Creatinine, Urine: 109.84 mg/dL

## 2021-06-23 LAB — FERRITIN: Ferritin: 335 ng/mL — ABNORMAL HIGH (ref 11–307)

## 2021-06-23 LAB — FOLATE: Folate: 17.7 ng/mL (ref >5.9)

## 2021-06-23 LAB — VITAMIN B12: Vitamin B-12: 405 pg/mL (ref 180–914)

## 2021-06-24 DIAGNOSIS — N170 Acute kidney failure with tubular necrosis: Secondary | ICD-10-CM | POA: Diagnosis not present

## 2021-06-24 DIAGNOSIS — D638 Anemia in other chronic diseases classified elsewhere: Secondary | ICD-10-CM | POA: Diagnosis not present

## 2021-06-24 DIAGNOSIS — I129 Hypertensive chronic kidney disease with stage 1 through stage 4 chronic kidney disease, or unspecified chronic kidney disease: Secondary | ICD-10-CM | POA: Diagnosis not present

## 2021-06-24 DIAGNOSIS — N184 Chronic kidney disease, stage 4 (severe): Secondary | ICD-10-CM | POA: Diagnosis not present

## 2021-06-24 DIAGNOSIS — D472 Monoclonal gammopathy: Secondary | ICD-10-CM | POA: Diagnosis not present

## 2021-06-24 DIAGNOSIS — D696 Thrombocytopenia, unspecified: Secondary | ICD-10-CM | POA: Diagnosis not present

## 2021-06-24 LAB — KAPPA/LAMBDA LIGHT CHAINS
Kappa free light chain: 102.3 mg/L — ABNORMAL HIGH (ref 3.3–19.4)
Kappa, lambda light chain ratio: 3.33 — ABNORMAL HIGH (ref 0.26–1.65)
Lambda free light chains: 30.7 mg/L — ABNORMAL HIGH (ref 5.7–26.3)

## 2021-06-24 LAB — BETA 2 MICROGLOBULIN, SERUM: Beta-2 Microglobulin: 7.1 mg/L — ABNORMAL HIGH (ref 0.6–2.4)

## 2021-06-25 LAB — PROTEIN ELECTROPHORESIS, SERUM
A/G Ratio: 0.7 (ref 0.7–1.7)
Albumin ELP: 3.4 g/dL (ref 2.9–4.4)
Alpha-1-Globulin: 0.3 g/dL (ref 0.0–0.4)
Alpha-2-Globulin: 0.8 g/dL (ref 0.4–1.0)
Beta Globulin: 0.9 g/dL (ref 0.7–1.3)
Gamma Globulin: 2.7 g/dL — ABNORMAL HIGH (ref 0.4–1.8)
Globulin, Total: 4.7 g/dL — ABNORMAL HIGH (ref 2.2–3.9)
M-Spike, %: 2.2 g/dL — ABNORMAL HIGH
Total Protein ELP: 8.1 g/dL (ref 6.0–8.5)

## 2021-06-28 LAB — IMMUNOFIXATION ELECTROPHORESIS
IgA: 74 mg/dL (ref 64–422)
IgG (Immunoglobin G), Serum: 3072 mg/dL — ABNORMAL HIGH (ref 586–1602)
IgM (Immunoglobulin M), Srm: 13 mg/dL — ABNORMAL LOW (ref 26–217)
Total Protein ELP: 8.5 g/dL (ref 6.0–8.5)

## 2021-07-12 ENCOUNTER — Other Ambulatory Visit: Payer: Self-pay | Admitting: Radiology

## 2021-07-12 ENCOUNTER — Other Ambulatory Visit: Payer: Self-pay | Admitting: Student

## 2021-07-13 ENCOUNTER — Other Ambulatory Visit: Payer: Self-pay | Admitting: Radiology

## 2021-07-13 NOTE — H&P (Signed)
Chief Complaint: Patient was seen in consultation today for bone marrow biopsy with aspiration  Referring Physician(s): Katragadda,Sreedhar  Supervising Physician: Corrie Mckusick  Patient Status: [facility] - Out-pt  History of Present Illness: Lisa [name] is an 87 y.o. female with a medical history significant for COPD and HTN. She was referred to oncology for evaluation of MGUS identified on lab work obtained by her PCP. Oncology is requesting a bone marrow biopsy with aspiration for chromosome analysis and myeloma FISH panel.   Past Medical History:  Diagnosis Date   COPD (chronic obstructive pulmonary disease) ([place])    Hypertension    Pyelonephritis 04/06/2014   UTI    Past Surgical History:  Procedure Laterality Date   ABDOMINAL HYSTERECTOMY     CHOLECYSTECTOMY N/A 09/17/2018   Procedure: LAPAROSCOPIC CHOLECYSTECTOMY;  Surgeon: Virl Cagey, MD;  Location: [facility];  Service: General;  Laterality: N/A;   TEE WITHOUT CARDIOVERSION N/A 04/10/2014   Procedure: TRANSESOPHAGEAL ECHOCARDIOGRAM (TEE);  Surgeon: [name], MD;  Location: [facility] ENDOSCOPY;  Service: Cardiovascular;  Laterality: N/A;    Allergies: Patient has no known allergies.  Medications: Prior to Admission medications   Medication Sig Start Date End Date Taking? Authorizing Provider  aspirin 81 MG EC tablet Take by mouth.    [provider]  calcium carbonate (OS-CAL - DOSED IN MG OF ELEMENTAL CALCIUM) 1250 (500 Ca) MG tablet Take by mouth.    [provider]  Cholecalciferol (VITAMIN D3) 125 MCG (5000 UT) CAPS Take 1 capsule by mouth daily.    [provider]  diltiazem (CARDIZEM CD) 120 MG 24 hr capsule Take 120 mg by mouth daily. 04/12/21   [provider]  feeding supplement, ENSURE COMPLETE, (ENSURE COMPLETE) LIQD Take 237 mLs by mouth 2 (two) times daily between meals. 04/15/14   Mikhail, Velta Addison, DO  furosemide (LASIX) 20 MG tablet Take 1 tablet by mouth 2 (two)  times daily. 04/25/21   [provider]  Ipratropium-Albuterol (COMBIVENT) 20-100 MCG/ACT AERS respimat Inhale 1 puff into the lungs every 6 (six) hours as needed for wheezing or shortness of breath.     [provider]  ipratropium-albuterol (DUONEB) 0.5-2.5 (3) MG/3ML SOLN Take 3 mLs by nebulization every 6 (six) hours as needed. For shortness of breath    [provider]  labetalol (NORMODYNE) 300 MG tablet Take 300 mg by mouth 2 (two) times daily.    [provider]  vitamin C (ASCORBIC ACID) 500 MG tablet Take 500 mg by mouth daily.    [provider]     Family History  Problem Relation Age of Onset   Hypertension Mother    Thyroid disease Mother    Hypertension Father    Thyroid disease Sister    Thyroid disease Brother    Thyroid disease Sister     Social History   Socioeconomic History   Marital status: Widowed    Spouse name: Not on file   Number of children: Not on file   Years of education: Not on file   Highest education level: Not on file  Occupational History   Not on file  Tobacco Use   Smoking status: Former    Types: Cigarettes    Quit date: 05/12/2014    Years since quitting: 7.1   Smokeless tobacco: Never  Vaping Use   Vaping Use: Never used  Substance and Sexual Activity   Alcohol use: No   Drug use: No   Sexual activity: Not  on file  Other Topics Concern   Not on file  Social History Narrative   Not on file   Social Determinants of Health   Financial Resource Strain: Not on file  Food Insecurity: Not on file  Transportation Needs: Not on file  Physical Activity: Not on file  Stress: Not on file  Social Connections: Not on file    Review of Systems: A 12 point ROS discussed and pertinent positives are indicated in the HPI above.  All other systems are negative.  Review of Systems  Constitutional:  Negative for appetite change and fatigue.  Respiratory:  Positive for shortness of breath. Negative  for cough.   Cardiovascular:  Positive for leg swelling. Negative for chest pain.  Gastrointestinal:  Negative for abdominal pain, diarrhea, nausea and vomiting.  Neurological:  Positive for tremors. Negative for dizziness and headaches.   Vital Signs: BP (!) 136/122 (BP Location: Left Arm)    Pulse 82    Temp 98.4 F (36.9 C) (Oral)    Resp 18    SpO2 98%   Physical Exam Constitutional:      General: She is not in acute distress.    Appearance: She is not ill-appearing.  HENT:     Mouth/Throat:     Mouth: Mucous membranes are moist.     Pharynx: Oropharynx is clear.  Cardiovascular:     Rate and Rhythm: Normal rate and regular rhythm.     Pulses: Normal pulses.     Heart sounds: Normal heart sounds.  Pulmonary:     Effort: Pulmonary effort is normal.     Breath sounds: Normal breath sounds.  Abdominal:     General: Bowel sounds are normal.     Palpations: Abdomen is soft.     Tenderness: There is no abdominal tenderness.  Musculoskeletal:     Right lower leg: Edema present.     Left lower leg: Edema present.  Skin:    General: Skin is warm and dry.  Neurological:     Mental Status: She is alert and oriented to person, place, and time.    Imaging: DG Bone Survey Met  Result Date: 06/21/2021 CLINICAL DATA:  Monoclonal gammopathy of uncertain significance. EXAM: METASTATIC BONE SURVEY COMPARISON:  Portable chest 04/06/2014, PA Lat chest 05/26/2021. No prior dedicated imaging of the remaining regions FINDINGS: Standard metastatic bone survey images are reviewed. Left lateral view of the calvarium and mandible demonstrates no lytic or blastic lesions. There is osteopenia. There are advanced degenerative changes at C3-4, C5-6 and C6-7, with grade 1 degenerative anterolisthesis at C4-5 but no suspicious cervical bone lesion. The cervical carotid arteries are heavily calcified. Thoracic spine demonstrates osteopenia without evidence of a visible focal lesion, fractures or  malalignment. Mild thoracic spondylosis. The shoulders and bilateral ribcage show no appreciable focal lesions, with mild narrowing and spurring of both AC joints present. Evaluation of the bilateral upper extremities also demonstrates no concerning focal lesion. In the lumbar spine, there is mild dextroscoliosis with advanced degenerative changes at the lowest 4 levels, and no compression fractures or visible focal lesions. Evaluation of the abdominal aorta and iliofemoral arteries are heavily calcified. The bilateral femurs are unremarkable. The bilateral forelegs are also unremarkable aside from calcifications in the popliteal trifurcation arteries a 1 cm sclerotic lesion in the distal tibial diametaphysis on the right. There is mild edema in the forelegs and ankles. Chest and abdomen: Heart size and vasculature are normal except for calcification of the aortic arch. The  lungs are clear. The bowel pattern is nonobstructive. No pathologic calcification aside from vascular calcification is seen. There are cholecystectomy clips. IMPRESSION: 1. 1 cm well-circumscribed rounded sclerotic lesion in the distal tibial shaft on the right. This is nonspecific but probably represents a bone island. Sclerotic metastatic disease is not strictly excluded but would be unusual as an isolated lesion in this single location. Otherwise could be a superimposed soft tissue calcification. No lytic bone lesion is seen throughout, with osteopenia and degenerative changes described above. 2. Heavy vascular calcifications. Electronically Signed   By: Telford Nab M.D.   On: 06/21/2021 22:57   ECHOCARDIOGRAM COMPLETE  Result Date: 06/14/2021    ECHOCARDIOGRAM REPORT   Patient Name:   Lisa [name] Date of Exam: 06/14/2021 Medical Rec #:  [id number]          Height:       66.0 in Accession #:    [id number]         Weight:       110.0 lb Date of Birth:  [birth year]           BSA:          1.551 m Patient Age:    32 years           BP:            108/62 mmHg Patient Gender: F                  HR:           66 bpm. Exam Location:  [facility] Procedure: 2D Echo, Cardiac Doppler and Color Doppler Indications:    N17.0 (ICD-10-CM) - Acute kidney failure with lesion of tubular                 necrosis  History:        Patient has prior history of Echocardiogram examinations. COPD;                 Risk Factors:Hypertension. Hx of goiter.  Sonographer:    Alvino Chapel RCS Referring Phys: [place]  1. Left ventricular ejection fraction, by estimation, is 60 to 65%. The left ventricle has normal function. The left ventricle has no regional wall motion abnormalities. There is mild left ventricular hypertrophy. Left ventricular diastolic parameters are consistent with Grade II diastolic dysfunction (pseudonormalization). Elevated left atrial pressure.  2. Right ventricular systolic function is normal. The right ventricular size is normal. There is mildly elevated pulmonary artery systolic pressure.  3. Left atrial size was severely dilated.  4. The mitral valve is abnormal. Trivial mitral valve regurgitation. No evidence of mitral stenosis. Moderate mitral annular calcification.  5. The aortic valve is tricuspid. Aortic valve regurgitation is not visualized. No aortic stenosis is present.  6. The inferior vena cava is dilated in size with >50% respiratory variability, suggesting right atrial pressure of 8 mmHg. FINDINGS  Left Ventricle: Left ventricular ejection fraction, by estimation, is 60 to 65%. The left ventricle has normal function. The left ventricle has no regional wall motion abnormalities. The left ventricular internal cavity size was normal in size. There is  mild left ventricular hypertrophy. Left ventricular diastolic parameters are consistent with Grade II diastolic dysfunction (pseudonormalization). Elevated left atrial pressure. Right Ventricle: The right ventricular size is normal. No increase in right ventricular  wall thickness. Right ventricular systolic function is normal. There is mildly elevated pulmonary artery systolic pressure. The tricuspid regurgitant velocity  is 2.88  m/s, and with an assumed right atrial pressure of 8 mmHg, the estimated right ventricular systolic pressure is 70.0 mmHg. Left Atrium: Left atrial size was severely dilated. Right Atrium: Right atrial size was normal in size. Pericardium: There is no evidence of pericardial effusion. Mitral Valve: The mitral valve is abnormal. There is mild thickening of the mitral valve leaflet(s). There is mild calcification of the mitral valve leaflet(s). Moderate mitral annular calcification. Trivial mitral valve regurgitation. No evidence of mitral valve stenosis. Tricuspid Valve: The tricuspid valve is normal in structure. Tricuspid valve regurgitation is mild . No evidence of tricuspid stenosis. Aortic Valve: The aortic valve is tricuspid. Aortic valve regurgitation is not visualized. No aortic stenosis is present. Aortic valve mean gradient measures 3.5 mmHg. Aortic valve peak gradient measures 7.0 mmHg. Aortic valve area, by VTI measures 1.60 cm. Pulmonic Valve: The pulmonic valve was not well visualized. Pulmonic valve regurgitation is not visualized. No evidence of pulmonic stenosis. Aorta: The aortic root is normal in size and structure. Venous: The inferior vena cava is dilated in size with greater than 50% respiratory variability, suggesting right atrial pressure of 8 mmHg. IAS/Shunts: No atrial level shunt detected by color flow Doppler.  LEFT VENTRICLE PLAX 2D LVIDd:         4.10 cm   Diastology LVIDs:         1.90 cm   LV e' medial:    4.03 cm/s LV PW:         1.10 cm   LV E/e' medial:  29.5 LV IVS:        1.00 cm   LV e' lateral:   5.55 cm/s LVOT diam:     1.70 cm   LV E/e' lateral: 21.4 LV SV:         57 LV SV Index:   37 LVOT Area:     2.27 cm  RIGHT VENTRICLE RV S prime:     10.30 cm/s TAPSE (M-mode): 2.0 cm LEFT ATRIUM             Index         RIGHT ATRIUM           Index LA diam:        3.90 cm 2.51 cm/m   RA Area:     12.40 cm LA Vol (A2C):   83.0 ml 53.51 ml/m  RA Volume:   27.10 ml  17.47 ml/m LA Vol (A4C):   78.6 ml 50.67 ml/m LA Biplane Vol: 86.2 ml 55.57 ml/m  AORTIC VALVE AV Area (Vmax):    1.73 cm AV Area (Vmean):   1.75 cm AV Area (VTI):     1.60 cm AV Vmax:           132.48 cm/s AV Vmean:          86.259 cm/s AV VTI:            0.355 m AV Peak Grad:      7.0 mmHg AV Mean Grad:      3.5 mmHg LVOT Vmax:         101.00 cm/s LVOT Vmean:        66.500 cm/s LVOT VTI:          0.251 m LVOT/AV VTI ratio: 0.71  AORTA Ao Root diam: 2.90 cm MITRAL VALVE                TRICUSPID VALVE MV Area (PHT): 3.03 cm  TR Peak grad:   33.2 mmHg MV Decel Time: 250 msec     TR Vmax:        288.00 cm/s MV E velocity: 119.00 cm/s MV A velocity: 73.70 cm/s   SHUNTS MV E/A ratio:  1.61         Systemic VTI:  0.25 m                             Systemic Diam: 1.70 cm Carlyle Dolly MD Electronically signed by Carlyle Dolly MD Signature Date/Time: 06/14/2021/11:36:33 AM    Final     Labs:  CBC: Recent Labs    02/07/21 1031 05/26/21 1417 06/21/21 1429  WBC 5.2 2.7* 3.4*  HGB 9.8* 10.1* 10.6*  HCT 30.5* 31.7* 33.7*  PLT 87* 126* 127*    COAGS: No results for input(s): INR, APTT in the last 8760 hours.  BMP: Recent Labs    02/07/21 1031 05/26/21 1417 06/21/21 1429  NA 133* 135 139  K 5.5* 4.3 4.5  CL 109 104 108  CO2 19* 23 25  GLUCOSE 109* 104* 96  BUN 55* 55* 46*  CALCIUM 8.8* 8.8* 9.3  CREATININE 2.07* 1.97* 1.89*  GFRNONAA 23* 25* 26*    LIVER FUNCTION TESTS: Recent Labs    05/26/21 1417 06/21/21 1429  BILITOT 0.2* 0.4  AST 31 27  ALT 24 19  ALKPHOS 42 54  PROT 8.3* 8.5*  ALBUMIN 3.4* 3.4*    TUMOR MARKERS: No results for input(s): AFPTM, CEA, CA199, CHROMGRNA in the last 8760 hours.  Assessment and Plan:  IgG kappa monoclonal gammopathy: Lisa [name], 87 year old female, presents today to the [place] [facility] department for an image-guide bone marrow biopsy with aspiration.  Risks and benefits of this procedure were discussed with the patient and/or patient's family including, but not limited to bleeding, infection, damage to adjacent structures or low yield requiring additional tests.  All of the questions were answered and there is agreement to proceed. She has been NPO.   Consent signed and in chart.  Thank you for this interesting consult.  I greatly enjoyed meeting Lisa [name] and look forward to participating in their care.  A copy of this report was sent to the requesting provider on this date.  Electronically Signed: Soyla Dryer, AGACNP-BC [phone] 07/14/2021, 9:14 AM   I spent a total of  30 Minutes   in face to face in clinical consultation, greater than 50% of which was counseling/coordinating care for bone marrow biopsy with aspiration.

## 2021-07-14 ENCOUNTER — Other Ambulatory Visit: Payer: Self-pay

## 2021-07-14 ENCOUNTER — Ambulatory Visit (HOSPITAL_COMMUNITY)
Admission: RE | Admit: 2021-07-14 | Discharge: 2021-07-14 | Disposition: A | Discharge status: Home / Self Care | Payer: Medicare Other | Source: Ambulatory Visit | Attending: Hematology | Admitting: Hematology

## 2021-07-14 ENCOUNTER — Encounter (HOSPITAL_COMMUNITY): Payer: Self-pay

## 2021-07-14 DIAGNOSIS — I10 Essential (primary) hypertension: Secondary | ICD-10-CM | POA: Diagnosis not present

## 2021-07-14 DIAGNOSIS — J449 Chronic obstructive pulmonary disease, unspecified: Secondary | ICD-10-CM | POA: Insufficient documentation

## 2021-07-14 DIAGNOSIS — Q978 Other specified sex chromosome abnormalities, female phenotype: Secondary | ICD-10-CM | POA: Insufficient documentation

## 2021-07-14 DIAGNOSIS — D472 Monoclonal gammopathy: Secondary | ICD-10-CM | POA: Insufficient documentation

## 2021-07-14 DIAGNOSIS — D61818 Other pancytopenia: Secondary | ICD-10-CM | POA: Diagnosis not present

## 2021-07-14 LAB — CBC WITH DIFFERENTIAL/PLATELET
Abs Immature Granulocytes: 0.02 10*3/uL (ref 0.00–0.07)
Basophils Absolute: 0 10*3/uL (ref 0.0–0.1)
Basophils Relative: 1 %
Eosinophils Absolute: 0 10*3/uL (ref 0.0–0.5)
Eosinophils Relative: 1 %
HCT: 33.3 % — ABNORMAL LOW (ref 36.0–46.0)
Hemoglobin: 10.8 g/dL — ABNORMAL LOW (ref 12.0–15.0)
Immature Granulocytes: 1 %
Lymphocytes Relative: 28 %
Lymphs Abs: 1 10*3/uL (ref 0.7–4.0)
MCH: 30.6 pg (ref 26.0–34.0)
MCHC: 32.4 g/dL (ref 30.0–36.0)
MCV: 94.3 fL (ref 80.0–100.0)
Monocytes Absolute: 0.3 10*3/uL (ref 0.1–1.0)
Monocytes Relative: 8 %
Neutro Abs: 2.3 10*3/uL (ref 1.7–7.7)
Neutrophils Relative %: 61 %
Platelets: 125 10*3/uL — ABNORMAL LOW (ref 150–400)
RBC: 3.53 MIL/uL — ABNORMAL LOW (ref 3.87–5.11)
RDW: 12.4 % (ref 11.5–15.5)
WBC: 3.7 10*3/uL — ABNORMAL LOW (ref 4.0–10.5)
nRBC: 0 % (ref 0.0–0.2)

## 2021-07-14 MED ORDER — FENTANYL CITRATE (PF) 100 MCG/2ML IJ SOLN
INTRAMUSCULAR | Status: AC
  Filled 2021-07-14: qty 2

## 2021-07-14 MED ORDER — LIDOCAINE HCL (PF) 1 % IJ SOLN
INTRAMUSCULAR | Status: AC | PRN
  Administered 2021-07-14: 10 mL

## 2021-07-14 MED ORDER — LABETALOL HCL 300 MG PO TABS
300.0000 mg | ORAL_TABLET | Freq: Once | ORAL | Status: AC
Start: 2021-07-14 — End: 2021-07-14
  Administered 2021-07-14: 300 mg via ORAL
  Filled 2021-07-14: qty 1

## 2021-07-14 MED ORDER — FENTANYL CITRATE (PF) 100 MCG/2ML IJ SOLN
INTRAMUSCULAR | Status: AC | PRN
  Administered 2021-07-14 (×2): 50 ug via INTRAVENOUS

## 2021-07-14 MED ORDER — MIDAZOLAM HCL 2 MG/2ML IJ SOLN
INTRAMUSCULAR | Status: AC
  Filled 2021-07-14: qty 2

## 2021-07-14 MED ORDER — MIDAZOLAM HCL 2 MG/2ML IJ SOLN
INTRAMUSCULAR | Status: AC | PRN
  Administered 2021-07-14: .5 mg via INTRAVENOUS

## 2021-07-14 MED ORDER — SODIUM CHLORIDE 0.9 % IV SOLN: INTRAVENOUS | Status: DC

## 2021-07-14 NOTE — Procedures (Signed)
Interventional Radiology Procedure Note  Procedure: CT guided aspirate and core biopsy of right iliac bone  Complications: None  Recommendations: - Bedrest supine x 1 hrs - Hydrocodone PRN  Pain - Follow biopsy results   Chariti Havel, MD   

## 2021-07-14 NOTE — Discharge Instructions (Signed)
Please call Interventional Radiology clinic [phone] with any questions or concerns. ° °You may remove your dressing and shower tomorrow. ° ° °Bone Marrow Aspiration and Bone Marrow Biopsy, Adult, Care After °This sheet gives you information about how to care for yourself after your procedure. Your health care provider may also give you more specific instructions. If you have problems or questions, contact your health care provider. °What can I expect after the procedure? °After the procedure, it is common to have: °Mild pain and tenderness. °Swelling. °Bruising. °Follow these instructions at home: °Puncture site care °Follow instructions from your health care provider about how to take care of the puncture site. Make sure you: °Wash your hands with soap and water before and after you change your bandage (dressing). If soap and water are not available, use hand sanitizer. °Change your dressing as told by your health care provider. °Check your puncture site every day for signs of infection. Check for: °More redness, swelling, or pain. °Fluid or blood. °Warmth. °Pus or a bad smell.   °Activity °Return to your normal activities as told by your health care provider. Ask your health care provider what activities are safe for you. °Do not lift anything that is heavier than 10 lb (4.5 kg), or the limit that you are told, until your health care provider says that it is safe. °Do not drive for 24 hours if you were given a sedative during your procedure. °General instructions °Take over-the-counter and prescription medicines only as told by your health care provider. °Do not take baths, swim, or use a hot tub until your health care provider approves. Ask your health care provider if you may take showers. You may only be allowed to take sponge baths. °If directed, put ice on the affected area. To do this: °Put ice in a plastic bag. °Place a towel between your skin and the bag. °Leave the ice on for 20 minutes, 2-3 times a  day. °Keep all follow-up visits as told by your health care provider. This is important.   °Contact a health care provider if: °Your pain is not controlled with medicine. °You have a fever. °You have more redness, swelling, or pain around the puncture site. °You have fluid or blood coming from the puncture site. °Your puncture site feels warm to the touch. °You have pus or a bad smell coming from the puncture site. °Summary °After the procedure, it is common to have mild pain, tenderness, swelling, and bruising. °Follow instructions from your health care provider about how to take care of the puncture site and what activities are safe for you. °Take over-the-counter and prescription medicines only as told by your health care provider. °Contact a health care provider if you have any signs of infection, such as fluid or blood coming from the puncture site. °This information is not intended to replace advice given to you by your health care provider. Make sure you discuss any questions you have with your health care provider. °Document Revised: 10/15/2018 Document Reviewed: 10/15/2018 °Elsevier Patient Education © 2021 [employer] ° ° °Moderate Conscious Sedation, Adult, Care After °This sheet gives you information about how to care for yourself after your procedure. Your health care provider may also give you more specific instructions. If you have problems or questions, contact your health care provider. °What can I expect after the procedure? °After the procedure, it is common to have: °Sleepiness for several hours. °Impaired judgment for several hours. °Difficulty with balance. °Vomiting if you eat too   soon. °Follow these instructions at home: °For the time period you were told by your health care provider: °Rest. °Do not participate in activities where you could fall or become injured. °Do not drive or use machinery. °Do not drink alcohol. °Do not take sleeping pills or medicines that cause drowsiness. °Do not  make important decisions or sign legal documents. °Do not take care of children on your own.  °  °  °Eating and drinking °Follow the diet recommended by your health care provider. °Drink enough fluid to keep your urine pale yellow. °If you vomit: °Drink water, juice, or soup when you can drink without vomiting. °Make sure you have little or no nausea before eating solid foods.   °General instructions °Take over-the-counter and prescription medicines only as told by your health care provider. °Have a responsible adult stay with you for the time you are told. It is important to have someone help care for you until you are awake and alert. °Do not smoke. °Keep all follow-up visits as told by your health care provider. This is important. °Contact a health care provider if: °You are still sleepy or having trouble with balance after 24 hours. °You feel light-headed. °You keep feeling nauseous or you keep vomiting. °You develop a rash. °You have a fever. °You have redness or swelling around the IV site. °Get help right away if: °You have trouble breathing. °You have new-onset confusion at home. °Summary °After the procedure, it is common to feel sleepy, have impaired judgment, or feel nauseous if you eat too soon. °Rest after you get home. Know the things you should not do after the procedure. °Follow the diet recommended by your health care provider and drink enough fluid to keep your urine pale yellow. °Get help right away if you have trouble breathing or new-onset confusion at home. °This information is not intended to replace advice given to you by your health care provider. Make sure you discuss any questions you have with your health care provider. °Document Revised: 09/26/2019 Document Reviewed: 04/24/2019 °Elsevier Patient Education © 2021 [employer]  °

## 2021-07-15 DIAGNOSIS — J449 Chronic obstructive pulmonary disease, unspecified: Secondary | ICD-10-CM | POA: Diagnosis not present

## 2021-07-15 DIAGNOSIS — I10 Essential (primary) hypertension: Secondary | ICD-10-CM | POA: Diagnosis not present

## 2021-07-20 ENCOUNTER — Ambulatory Visit (HOSPITAL_COMMUNITY): Payer: Medicare Other | Admitting: Hematology

## 2021-07-21 ENCOUNTER — Encounter (INDEPENDENT_AMBULATORY_CARE_PROVIDER_SITE_OTHER): Payer: Self-pay | Admitting: Gastroenterology

## 2021-07-21 ENCOUNTER — Ambulatory Visit (INDEPENDENT_AMBULATORY_CARE_PROVIDER_SITE_OTHER): Payer: Medicare Other | Admitting: Gastroenterology

## 2021-07-21 ENCOUNTER — Other Ambulatory Visit: Payer: Self-pay

## 2021-07-21 VITALS — BP 146/61 | HR 60 | Temp 98.4°F | Ht 66.0 in | Wt 108.4 lb

## 2021-07-21 DIAGNOSIS — D631 Anemia in chronic kidney disease: Secondary | ICD-10-CM

## 2021-07-21 DIAGNOSIS — N184 Chronic kidney disease, stage 4 (severe): Secondary | ICD-10-CM | POA: Diagnosis not present

## 2021-07-21 DIAGNOSIS — B1920 Unspecified viral hepatitis C without hepatic coma: Secondary | ICD-10-CM | POA: Diagnosis not present

## 2021-07-21 NOTE — Progress Notes (Signed)
Referring Provider: Carrolyn Meiers* Primary Care Physician:  Carrolyn Meiers, MD Primary GI Physician: new  Chief Complaint  Patient presents with   Anemia    Pt arrives with niece for a new patient visit for anemia.    HPI:   Lisa [name] is a 87 y.o. female with past medical history of HTN,CKD and chronic anemia.  Patient presenting today as new patient for Hepatitis C/anemia. Patient arrives with her Niece Lisa [name], who helps provide history.   Notably patient had positive Hep C antibody in Dec 2022 with HCV RNA Quant of 1,110, 000 and HCV RNA QT 6.05. Hep B surface Ag was non reactive at that time and HIV testing was negative. Patient states that she had never been previously told about Hepatitis C diagnosis. LFTs appear to be WNL normal limits, though notably, platelet count has been low for some time and AST>ALT.   Patient with history of CKD and chronic anemia. Last labs with hgb of 10.8 with MCV 94.3 on 07/14/21. Iron studies on 06/21/21 WNL with Iron 45, TIBC 333, saturation 14 and ferritin 335. Folate and B12 WNL at that time. She was sent to Hematology/oncology for evaluation of MGUS. She denies any blood in her stools or black stools. She does endorse some fatigue at times. Is not currently on any iron supplementation. She has COPD which causes her to be short of breath. She had a colonoscopy maybe 15-20 years ago and thinks it was normal. Denies any changes in stool habits, appetite or weight.   NSAID DZH:GDJM 81mg  ASA Social hx:no etoh, 5-6 cigarettes per day, no illicit drug use Fam EQ:ASTMHDQ'Q sister had CRC, died at age 41 Last Colonoscopy: 15-20 years ago, normal per patient  Last Endoscopy:never  Past Medical History:  Diagnosis Date   COPD (chronic obstructive pulmonary disease) ([place])    Hypertension    Pyelonephritis 04/06/2014   UTI    Past Surgical History:  Procedure Laterality Date   ABDOMINAL HYSTERECTOMY     CHOLECYSTECTOMY N/A  09/17/2018   Procedure: LAPAROSCOPIC CHOLECYSTECTOMY;  Surgeon: Virl Cagey, MD;  Location: [facility];  Service: General;  Laterality: N/A;   TEE WITHOUT CARDIOVERSION N/A 04/10/2014   Procedure: TRANSESOPHAGEAL ECHOCARDIOGRAM (TEE);  Surgeon: [name], MD;  Location: [facility] ENDOSCOPY;  Service: Cardiovascular;  Laterality: N/A;    Current Outpatient Medications  Medication Sig Dispense Refill   aspirin 81 MG EC tablet Take by mouth.     calcium carbonate (OS-CAL - DOSED IN MG OF ELEMENTAL CALCIUM) 1250 (500 Ca) MG tablet Take by mouth.     Cholecalciferol (VITAMIN D3) 125 MCG (5000 UT) CAPS Take 1 capsule by mouth daily.     diltiazem (CARDIZEM CD) 120 MG 24 hr capsule Take 120 mg by mouth daily.     feeding supplement, ENSURE COMPLETE, (ENSURE COMPLETE) LIQD Take 237 mLs by mouth 2 (two) times daily between meals.     furosemide (LASIX) 20 MG tablet Take 1 tablet by mouth 2 (two) times daily.     Ipratropium-Albuterol (COMBIVENT) 20-100 MCG/ACT AERS respimat Inhale 1 puff into the lungs every 6 (six) hours as needed for wheezing or shortness of breath.      ipratropium-albuterol (DUONEB) 0.5-2.5 (3) MG/3ML SOLN Take 3 mLs by nebulization every 6 (six) hours as needed. For shortness of breath     labetalol (NORMODYNE) 300 MG tablet Take 300 mg by mouth 2 (two) times daily.     vitamin C (ASCORBIC ACID)  500 MG tablet Take 500 mg by mouth daily.     No current facility-administered medications for this visit.    Allergies as of 07/21/2021   (No Known Allergies)    Family History  Problem Relation Age of Onset   Hypertension Mother    Thyroid disease Mother    Hypertension Father    Thyroid disease Sister    Thyroid disease Brother    Thyroid disease Sister     Social History   Socioeconomic History   Marital status: Widowed    Spouse name: Not on file   Number of children: Not on file   Years of education: Not on file   Highest education level: Not on file  Occupational  History   Not on file  Tobacco Use   Smoking status: Every Day    Types: Cigarettes    Last attempt to quit: 05/12/2014    Years since quitting: 7.1   Smokeless tobacco: Never  Vaping Use   Vaping Use: Never used  Substance and Sexual Activity   Alcohol use: No   Drug use: No   Sexual activity: Not on file  Other Topics Concern   Not on file  Social History Narrative   Not on file   Social Determinants of Health   Financial Resource Strain: Not on file  Food Insecurity: Not on file  Transportation Needs: Not on file  Physical Activity: Not on file  Stress: Not on file  Social Connections: Not on file   Review of systems General: negative for malaise, night sweats, fever, chills, weight loss Neck: Negative for lumps, goiter, pain and significant neck swelling Resp: Negative for cough, wheezing, dyspnea at rest CV: Negative for chest pain, leg swelling, palpitations, orthopnea GI: denies melena, hematochezia, nausea, vomiting, diarrhea, constipation, dysphagia, odyonophagia, early satiety or unintentional weight loss.  MSK: Negative for joint pain or swelling, back pain, and muscle pain. Derm: Negative for itching or rash Psych: Denies depression, anxiety, memory loss, confusion. No homicidal or suicidal ideation.  Heme: Negative for prolonged bleeding, bruising easily, and swollen nodes. Endocrine: Negative for cold or heat intolerance, polyuria, polydipsia and goiter. Neuro: negative for tremor, gait imbalance, syncope and seizures. The remainder of the review of systems is noncontributory.  Physical Exam: BP (!) 146/61 (BP Location: Right Arm, Patient Position: Sitting, Cuff Size: Normal)    Pulse 60    Temp 98.4 F (36.9 C) (Oral)    Ht 5\' 6"  (1.676 m)    Wt 108 lb 6.4 oz (49.2 kg)    BMI 17.50 kg/m  General:   Alert and oriented. No distress noted. Pleasant and cooperative.  Head:  Normocephalic and atraumatic. Eyes:  Conjuctiva clear without scleral icterus. Mouth:   Oral mucosa pink and moist. Good dentition. No lesions. Heart: Normal rate and rhythm, s1 and s2 heart sounds present.  Lungs: Clear lung sounds in all lobes. Respirations equal and unlabored. Abdomen:  +BS, soft, non-tender and non-distended. No rebound or guarding. No HSM or masses noted. Derm: No palmar erythema or jaundice Msk:  Symmetrical without gross deformities. Normal posture. Extremities:  Without edema. Neurologic:  Alert and  oriented x4 Psych:  Alert and cooperative. Normal mood and affect.  Invalid input(s): 6 MONTHS   ASSESSMENT: Lisa [name] is a 87 y.o. female presenting today as a new patient for new diagnosis of Hepatitis C and chronic anemia.  Anemia is a chronic issue for patient dating back to 2015, likely of chronic disease  related to her hx of CKD. Notably last labs with hgb of 10.8 (appears baseline is 10-11 range) with MCV 94.3 on 07/14/21 (MVC has remained WNL continuously). Iron studies on 06/21/21 WNL with Iron 45, TIBC 333, saturation 14 and ferritin 335. Folate and B12 WNL at that time. She is without changes in bowel habits, rectal bleeding or melena. We will check hemoccult cards x3 to ensure no blood present in stools, however, at this time I do not feel that endoscopic evaluation is warranted, as anemia does not appear to be secondary to iron deficiency. If stool cards are positive, we can further discuss colonoscopy.   In regards to Positive Hep C testing, patient is unaware of any previous diagnosis of this. She denies any previous high risk behaviors. Notably, platelet count has been low and LFTs, though WNL, suggest the possibility of cirrhosis with AST>ALT. Its possible Hep C is chronic for patient. We will proceed with US liver Elastography and further laboratory testing to include Hep C genotype and Hep B core Ab. I discussed with patient and her niece that given unknown time frame that she has had Hep C, there is potential that there could be some  liver damage (cirrhosis) which we need to further evaluate for. Once we know the condition of her liver and results of further lab work, we can further discuss if she is a candidate of Hep C treatment and/or goals of care, given her age.   All questions were answered, patient and niece verbalized understanding and are in agreement with plan as outlined above.   PLAN:  Occult stool cards x3, if positive discuss colonoscopy 2. US liver elastography 3. Hep C genotype and Hep B core Ab   Follow Up: 1 month with castaneda  Raianna Slight L. Alver Sorrow, MSN, APRN, AGNP-C Adult-Gerontology Nurse Practitioner [facility]

## 2021-07-21 NOTE — Patient Instructions (Signed)
I suspect your anemia is secondary to your kidney disease as iron studies have been normal. We will check stool cards to ensure no blood loss in your GI tract, if these are positive, we can talk about proceeding with colonoscopy, if they are negative, we do not need to do anything further unless you have rectal bleeding, black stools or further changes in your hemoglobin or drops in your iron levels.  As far as the hepatitis C, we will check some more labs today and do an ultrasound to try and rule out damage to the liver from the virus as sometimes, as we discussed, if this has been a chronic infection that you were unaware of, it can cause some scarring to the liver, known as cirrhosis.   We will see you back here within the next month for further discussion of plan of care after these tests result.  Follow up 1 month

## 2021-07-22 ENCOUNTER — Encounter (INDEPENDENT_AMBULATORY_CARE_PROVIDER_SITE_OTHER): Payer: Self-pay | Admitting: Gastroenterology

## 2021-07-22 DIAGNOSIS — B1920 Unspecified viral hepatitis C without hepatic coma: Secondary | ICD-10-CM | POA: Insufficient documentation

## 2021-07-22 DIAGNOSIS — D631 Anemia in chronic kidney disease: Secondary | ICD-10-CM | POA: Insufficient documentation

## 2021-07-22 DIAGNOSIS — N184 Chronic kidney disease, stage 4 (severe): Secondary | ICD-10-CM | POA: Insufficient documentation

## 2021-07-22 DIAGNOSIS — B182 Chronic viral hepatitis C: Secondary | ICD-10-CM | POA: Insufficient documentation

## 2021-07-25 ENCOUNTER — Encounter (HOSPITAL_COMMUNITY): Payer: Self-pay | Admitting: Hematology

## 2021-07-26 LAB — HEPATITIS C GENOTYPE

## 2021-07-26 LAB — HEPATITIS B CORE ANTIBODY, TOTAL: Hep B Core Total Ab: NONREACTIVE

## 2021-07-27 ENCOUNTER — Encounter (HOSPITAL_COMMUNITY): Payer: Self-pay | Admitting: Hematology

## 2021-07-28 ENCOUNTER — Inpatient Hospital Stay (HOSPITAL_COMMUNITY): Payer: Medicare Other | Attending: Hematology | Admitting: Hematology

## 2021-07-28 ENCOUNTER — Other Ambulatory Visit: Payer: Self-pay

## 2021-07-28 ENCOUNTER — Ambulatory Visit (HOSPITAL_COMMUNITY)
Admission: RE | Admit: 2021-07-28 | Discharge: 2021-07-28 | Disposition: A | Discharge status: Home / Self Care | Payer: Medicare Other | Source: Ambulatory Visit | Attending: Gastroenterology | Admitting: Gastroenterology

## 2021-07-28 VITALS — BP 153/87 | HR 78 | Temp 98.2°F | Resp 18 | Ht 66.0 in | Wt 108.0 lb

## 2021-07-28 DIAGNOSIS — D472 Monoclonal gammopathy: Secondary | ICD-10-CM | POA: Diagnosis not present

## 2021-07-28 DIAGNOSIS — D649 Anemia, unspecified: Secondary | ICD-10-CM | POA: Diagnosis not present

## 2021-07-28 DIAGNOSIS — B1920 Unspecified viral hepatitis C without hepatic coma: Secondary | ICD-10-CM | POA: Insufficient documentation

## 2021-07-28 DIAGNOSIS — K7689 Other specified diseases of liver: Secondary | ICD-10-CM | POA: Diagnosis not present

## 2021-07-28 NOTE — Patient Instructions (Addendum)
Lisa [name] at [facility] Discharge Instructions   You were seen and examined today by Dr. Delton Coombes.  He discussed the results of your bone marrow biopsy.  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  There is a 10% chance with each year that passes that this could develop into multiple myeloma.  For now, we will just continue to monitor, unless we see that from the PET scan there is something that will require treatment.   You would benefit from an iron infusion due to your kidney function. It will help your hemoglobin improve.  Return as scheduled for iron infusion, PET scan, lab work, and office visit.    Thank you for choosing [place] at [facility] to provide your oncology and hematology care.  To afford each patient quality time with our provider, please arrive at least 15 minutes before your scheduled appointment time.   If you have a lab appointment with the [place] please come in thru the Main Entrance and check in at the main information desk.  You need to re-schedule your appointment should you arrive 10 or more minutes late.  We strive to give you quality time with our providers, and arriving late affects you and other patients whose appointments are after yours.  Also, if you no show three or more times for appointments you may be dismissed from the clinic at the providers discretion.     Again, thank you for choosing [facility].  Our hope is that these requests will decrease the amount of time that you wait before being seen by our physicians.       _____________________________________________________________  Should you have questions after your visit to [facility], please contact our office at [phone] and follow the prompts.  Our office hours are 8:00 a.m. and 4:30 p.m. Monday - Friday.  Please note that voicemails left after 4:00 p.m. may not be returned until the  following business day.  We are closed weekends and major holidays.  You do have access to a nurse 24-7, just call the main number to the clinic [phone] and do not press any options, hold on the line and a nurse will answer the phone.    For prescription refill requests, have your pharmacy contact our office and allow 72 hours.    Due to Covid, you will need to wear a mask upon entering the hospital. If you do not have a mask, a mask will be given to you at the Main Entrance upon arrival. For doctor visits, patients may have 1 support person age 42 or older with them. For treatment visits, patients can not have anyone with them due to social distancing guidelines and our immunocompromised population.

## 2021-07-28 NOTE — Progress Notes (Signed)
[place], [place] [address]   CLINIC:  Medical Oncology/Hematology  PCP:  Carrolyn Meiers, MD [place] / [place] [id number]  [phone]  REASON FOR VISIT:  Follow-up for MGUS  PRIOR THERAPY: none  CURRENT THERAPY: under work-up  INTERVAL HISTORY:  [name], a 87 y.o. female, returns for routine follow-up for her MGUS. Lisa [name] was last seen on 06/21/2021.  Today she reports feeling well, and she is accompanied by her niece.   REVIEW OF SYSTEMS:  Review of Systems  Constitutional:  Positive for fatigue. Negative for appetite change.  Respiratory:  Positive for shortness of breath.   Neurological:  Positive for dizziness and numbness.  Psychiatric/Behavioral:  Positive for sleep disturbance.   All other systems reviewed and are negative.  PAST MEDICAL/SURGICAL HISTORY:  Past Medical History:  Diagnosis Date   CKD (chronic kidney disease)    COPD (chronic obstructive pulmonary disease) ([facility])    Hypertension    Pyelonephritis 04/06/2014   UTI   Past Surgical History:  Procedure Laterality Date   ABDOMINAL HYSTERECTOMY     CHOLECYSTECTOMY N/A 09/17/2018   Procedure: LAPAROSCOPIC CHOLECYSTECTOMY;  Surgeon: Virl Cagey, MD;  Location: [facility];  Service: General;  Laterality: N/A;   TEE WITHOUT CARDIOVERSION N/A 04/10/2014   Procedure: TRANSESOPHAGEAL ECHOCARDIOGRAM (TEE);  Surgeon: [name], MD;  Location: [facility] ENDOSCOPY;  Service: Cardiovascular;  Laterality: N/A;    SOCIAL HISTORY:  Social History   Socioeconomic History   Marital status: Widowed    Spouse name: Not on file   Number of children: Not on file   Years of education: Not on file   Highest education level: Not on file  Occupational History   Not on file  Tobacco Use   Smoking status: Every Day    Types: Cigarettes    Last attempt to quit: 05/12/2014    Years since quitting: 7.2   Smokeless tobacco: Never  Vaping Use    Vaping Use: Never used  Substance and Sexual Activity   Alcohol use: No   Drug use: No   Sexual activity: Not on file  Other Topics Concern   Not on file  Social History Narrative   Not on file   Social Determinants of Health   Financial Resource Strain: Not on file  Food Insecurity: Not on file  Transportation Needs: Not on file  Physical Activity: Not on file  Stress: Not on file  Social Connections: Not on file  Intimate Partner Violence: Not on file    FAMILY HISTORY:  Family History  Problem Relation Age of Onset   Hypertension Mother    Thyroid disease Mother    Hypertension Father    Thyroid disease Sister    Thyroid disease Brother    Thyroid disease Sister     CURRENT MEDICATIONS:  Current Outpatient Medications  Medication Sig Dispense Refill   aspirin 81 MG EC tablet Take by mouth.     calcium carbonate (OS-CAL - DOSED IN MG OF ELEMENTAL CALCIUM) 1250 (500 Ca) MG tablet Take by mouth.     Cholecalciferol (VITAMIN D3) 125 MCG (5000 UT) CAPS Take 1 capsule by mouth daily.     diltiazem (CARDIZEM CD) 120 MG 24 hr capsule Take 120 mg by mouth daily.     feeding supplement, ENSURE COMPLETE, (ENSURE COMPLETE) LIQD Take 237 mLs by mouth 2 (two) times daily between meals.     furosemide (LASIX) 20  MG tablet Take 1 tablet by mouth 2 (two) times daily.     Ipratropium-Albuterol (COMBIVENT) 20-100 MCG/ACT AERS respimat Inhale 1 puff into the lungs every 6 (six) hours as needed for wheezing or shortness of breath.      ipratropium-albuterol (DUONEB) 0.5-2.5 (3) MG/3ML SOLN Take 3 mLs by nebulization every 6 (six) hours as needed. For shortness of breath     labetalol (NORMODYNE) 300 MG tablet Take 300 mg by mouth 2 (two) times daily.     vitamin C (ASCORBIC ACID) 500 MG tablet Take 500 mg by mouth daily.     No current facility-administered medications for this visit.    ALLERGIES:  No Known Allergies  PHYSICAL EXAM:  Performance status (ECOG): 2 - Symptomatic,  <50% confined to bed  Vitals:   07/28/21 1051  BP: (!) 153/87  Pulse: 78  Resp: 18  Temp: 98.2 F (36.8 C)  SpO2: 98%   Wt Readings from Last 3 Encounters:  07/28/21 108 lb (49 kg)  07/21/21 108 lb 6.4 oz (49.2 kg)  07/14/21 105 lb (47.6 kg)   Physical Exam Vitals reviewed.  Constitutional:      Appearance: Normal appearance.     Comments: In wheelchair  Cardiovascular:     Rate and Rhythm: Normal rate and regular rhythm.     Pulses: Normal pulses.     Heart sounds: Normal heart sounds.  Pulmonary:     Effort: Pulmonary effort is normal.     Breath sounds: Normal breath sounds.  Neurological:     General: No focal deficit present.     Mental Status: She is alert and oriented to person, place, and time.  Psychiatric:        Mood and Affect: Mood normal.        Behavior: Behavior normal.    LABORATORY DATA:  I have reviewed the labs as listed.  CBC Latest Ref Rng & Units 07/14/2021 06/21/2021 05/26/2021  WBC 4.0 - 10.5 K/uL 3.7(L) 3.4(L) 2.7(L)  Hemoglobin 12.0 - 15.0 g/dL 10.8(L) 10.6(L) 10.1(L)  Hematocrit 36.0 - 46.0 % 33.3(L) 33.7(L) 31.7(L)  Platelets 150 - 400 K/uL 125(L) 127(L) 126(L)   CMP Latest Ref Rng & Units 06/21/2021 05/26/2021 02/07/2021  Glucose 70 - 99 mg/dL 96 104(H) 109(H)  BUN 8 - 23 mg/dL 46(H) 55(H) 55(H)  Creatinine 0.44 - 1.00 mg/dL 1.89(H) 1.97(H) 2.07(H)  Sodium 135 - 145 mmol/L 139 135 133(L)  Potassium 3.5 - 5.1 mmol/L 4.5 4.3 5.5(H)  Chloride 98 - 111 mmol/L 108 104 109  CO2 22 - 32 mmol/L 25 23 19(L)  Calcium 8.9 - 10.3 mg/dL 9.3 8.8(L) 8.8(L)  Total Protein 6.5 - 8.1 g/dL 8.5(H) 8.3(H) -  Total Bilirubin 0.3 - 1.2 mg/dL 0.4 0.2(L) -  Alkaline Phos 38 - 126 U/L 54 42 -  AST 15 - 41 U/L 27 31 -  ALT 0 - 44 U/L 19 24 -      Component Value Date/Time   RBC 3.53 (L) 07/14/2021 0912   MCV 94.3 07/14/2021 0912   MCH 30.6 07/14/2021 0912   MCHC 32.4 07/14/2021 0912   RDW 12.4 07/14/2021 0912   LYMPHSABS 1.0 07/14/2021 0912   MONOABS  0.3 07/14/2021 0912   EOSABS 0.0 07/14/2021 0912   BASOSABS 0.0 07/14/2021 0912    DIAGNOSTIC IMAGING:  I have independently reviewed the scans and discussed with the patient. CT Biopsy  Result Date: 07/14/2021 INDICATION: 87 year old female with history of MGUS. EXAM: CT-GUIDED BONE MARROW BIOPSY  AND ASPIRATION MEDICATIONS: None ANESTHESIA/SEDATION: Fentanyl 100 mcg IV; Versed 0.5 mg IV Sedation Time: 10 minutes; The patient was continuously monitored during the procedure by the interventional radiology nurse under my direct supervision. COMPLICATIONS: None immediate. PROCEDURE: Informed consent was obtained from the patient following an explanation of the procedure, risks, benefits and alternatives. The patient understands, agrees and consents for the procedure. All questions were addressed. A time out was performed prior to the initiation of the procedure. The patient was positioned prone and non-contrast localization CT was performed of the pelvis to demonstrate the iliac marrow spaces. The operative site was prepped and draped in the usual sterile fashion. Under sterile conditions and local anesthesia, a 22 gauge spinal needle was utilized for procedural planning. Next, an 11 gauge coaxial bone biopsy needle was advanced into the right iliac marrow space. Needle position was confirmed with CT imaging. Initially, a bone marrow aspiration was performed. Next, a bone marrow biopsy was obtained with the 11 gauge outer bone marrow device. Samples were prepared with the cytotechnologist and deemed adequate. The needle was removed and superficial hemostasis was obtained with manual compression. A dressing was applied. The patient tolerated the procedure well without immediate post procedural complication. IMPRESSION: Successful CT guided right iliac bone marrow aspiration and core biopsy. Lisa Cancer, MD [facility] Electronically Signed   By: Lisa [name] M.D.   On: 07/14/2021 14:21   CT BONE MARROW BIOPSY & ASPIRATION  Result Date: 07/14/2021 INDICATION: 87 year old female with history of MGUS. EXAM: CT-GUIDED BONE MARROW BIOPSY AND ASPIRATION MEDICATIONS: None ANESTHESIA/SEDATION: Fentanyl 100 mcg IV; Versed 0.5 mg IV Sedation Time: 10 minutes; The patient was continuously monitored during the procedure by the interventional radiology nurse under my direct supervision. COMPLICATIONS: None immediate. PROCEDURE: Informed consent was obtained from the patient following an explanation of the procedure, risks, benefits and alternatives. The patient understands, agrees and consents for the procedure. All questions were addressed. A time out was performed prior to the initiation of the procedure. The patient was positioned prone and non-contrast localization CT was performed of the pelvis to demonstrate the iliac marrow spaces. The operative site was prepped and draped in the usual sterile fashion. Under sterile conditions and local anesthesia, a 22 gauge spinal needle was utilized for procedural planning. Next, an 11 gauge coaxial bone biopsy needle was advanced into the right iliac marrow space. Needle position was confirmed with CT imaging. Initially, a bone marrow aspiration was performed. Next, a bone marrow biopsy was obtained with the 11 gauge outer bone marrow device. Samples were prepared with the cytotechnologist and deemed adequate. The needle was removed and superficial hemostasis was obtained with manual compression. A dressing was applied. The patient tolerated the procedure well without immediate post procedural complication. IMPRESSION: Successful CT guided right iliac bone marrow aspiration and core biopsy. Lisa Cancer, MD [facility] Electronically Signed   By: Lisa [name] M.D.   On: 07/14/2021 14:21   [place] ELASTOGRAPHY LIVER  Result Date: 07/28/2021 CLINICAL DATA:  History of  hepatitis C, evaluate for cirrhosis EXAM: US LIVER ELASTOGRAPHY TECHNIQUE: Sonography of the liver was performed. In addition, ultrasound elastography evaluation of the liver was performed. A region of interest was placed within the right lobe of the liver. Following application of a compressive sonographic pulse, tissue compressibility was assessed. Multiple assessments were performed at the selected site. Median tissue compressibility was determined. Previously, hepatic stiffness was assessed by shear  wave velocity. Based on recently published Society of Radiologists in Ultrasound consensus article, reporting is now recommended to be performed in the SI units of pressure (kiloPascals) representing hepatic stiffness/elasticity. The obtained result is compared to the published reference standards. (cACLD = compensated Advanced Chronic Liver Disease) COMPARISON:  MRI September 13, 2018 FINDINGS: Liver: No focal lesion identified. Diffusely increased parenchymal echogenicity with some coarsening of the hepatic echotexture. Portal vein is patent on color Doppler imaging with normal direction of blood flow towards the liver. ULTRASOUND HEPATIC ELASTOGRAPHY Device: Siemens Helix VTQ Patient position: Supine Transducer: 5C1 Number of measurements: 10 Hepatic segment:  8 Median kPa: 5.3 IQR: 0.4 IQR/Median kPa ratio: 0.1 Data quality:  Good Diagnostic category: < or = 9 kPa: in the absence of other known clinical signs, rules out cACLD The use of hepatic elastography is applicable to patients with viral hepatitis and non-alcoholic fatty liver disease. At this time, there is insufficient data for the referenced cut-off values and use in other causes of liver disease, including alcoholic liver disease. Patients, however, may be assessed by elastography and serve as their own reference standard/baseline. In patients with non-alcoholic liver disease, the values suggesting compensated advanced chronic liver disease (cACLD) may be  lower, and patients may need additional testing with elasticity results of 7-9 kPa. Please note that abnormal hepatic elasticity and shear wave velocities may also be identified in clinical settings other than with hepatic fibrosis, such as: acute hepatitis, elevated right heart and central venous pressures including use of beta blockers, veno-occlusive disease (Budd-Chiari), infiltrative processes such as mastocytosis/amyloidosis/infiltrative tumor/lymphoma, extrahepatic cholestasis, with hyperemia in the post-prandial state, and with liver transplantation. Correlation with patient history, laboratory data, and clinical condition recommended. Diagnostic Categories: < or =5 kPa: high probability of being normal < or =9 kPa: in the absence of other known clinical signs, rules out cACLD >9 kPa and ?13 kPa: suggestive of cACLD, but needs further testing >13 kPa: highly suggestive of cACLD > or =17 kPa: highly suggestive of cACLD with an increased probability of clinically significant portal hypertension IMPRESSION: ULTRASOUND LIVER: Diffusely increased hepatic parenchymal echogenicity with some coarsening of the echotexture, nonspecific most commonly reflecting hepatic steatosis and/or chronic hepatocellular disease. No overt hepatic lesion identified. ULTRASOUND HEPATIC ELASTOGRAPHY: Median kPa:  5.3 Diagnostic category: < or = 9 kPa: in the absence of other known clinical signs, rules out cACLD Electronically Signed   By: Dahlia Bailiff M.D.   On: 07/28/2021 10:34     ASSESSMENT:  - Patient seen at the request of Dr. Theador Hawthorne for monoclonal gammopathy. - Labs on 05/12/2021 showed M spike 2.4 g.  Immunofixation shows IgG kappa monoclonal protein with a faint IgA lambda monoclonal protein. - Kappa light chains 143, lambda light chains 38, ratio of 3.72. - 24-hour urine total protein 84 mg.  Urine immunofixation shows faint IgG kappa. - No B symptoms or neuropathy.   Social/family history: - She lives with her  nephew.  She is seen with her brother today.  She uses walker to ambulate. 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 - Current active smoker, half pack per day for 62 years.    PLAN:  IgG kappa smoldering myeloma: - I reviewed bone marrow biopsy from 07/14/2021. - Pathology shows hypocellular marrow with clonal plasma cell population comprising 15-20% of the total marrow cellularity.  No overt dysplasia. - Cytogenetics: 45,X-X[9]/46,XX[11] - Myeloma FISH panel: Monosomy 13, duplication 1 q. - Skeletal survey on 06/21/2021: 1 cm  circumscribed sclerotic lesion in the distal tibial shaft on the right, nonspecific.  No lytic bone lesion. - We have discussed normal pathophysiology and prognosis of smoldering myeloma with 10% chance of progression to myeloma per year. - There is worsening of creatinine since August of last year.  We do not have any creatinine prior to that until 2020. - I have recommended PET CT scan.  RTC after PET scan.  2.  Normocytic anemia: - Hemoglobin is 10.6.  Ferritin was 335 and percent saturation was 14. - Recommend 1 infusion of Feraheme.  We discussed side effects in detail.  Orders placed this encounter:  Orders Placed This Encounter  Procedures   NM PET Image Initial (PI) Whole Body     Derek Jack, MD [place] [phone]   I, Thana Ates, am acting as a scribe for Dr. Derek Jack.  I, Derek Jack MD, have reviewed the above documentation for accuracy and completeness, and I agree with the above.

## 2021-07-29 DIAGNOSIS — D509 Iron deficiency anemia, unspecified: Secondary | ICD-10-CM | POA: Insufficient documentation

## 2021-07-29 LAB — SURGICAL PATHOLOGY

## 2021-08-01 ENCOUNTER — Other Ambulatory Visit: Payer: Self-pay

## 2021-08-01 ENCOUNTER — Inpatient Hospital Stay (HOSPITAL_COMMUNITY): Payer: Medicare Other

## 2021-08-01 VITALS — BP 150/51 | HR 53 | Temp 98.6°F | Resp 18

## 2021-08-01 DIAGNOSIS — D649 Anemia, unspecified: Secondary | ICD-10-CM | POA: Diagnosis not present

## 2021-08-01 DIAGNOSIS — D472 Monoclonal gammopathy: Secondary | ICD-10-CM | POA: Diagnosis not present

## 2021-08-01 DIAGNOSIS — D509 Iron deficiency anemia, unspecified: Secondary | ICD-10-CM

## 2021-08-01 MED ORDER — SODIUM CHLORIDE 0.9 % IV SOLN
510.0000 mg | Freq: Once | INTRAVENOUS | Status: AC
  Administered 2021-08-01: 510 mg via INTRAVENOUS
  Filled 2021-08-01: qty 17

## 2021-08-01 MED ORDER — SODIUM CHLORIDE 0.9 % IV SOLN: Freq: Once | INTRAVENOUS | Status: AC

## 2021-08-01 NOTE — Progress Notes (Signed)
Patient tolerated iron infusion with no complaints voiced.  Peripheral IV site clean and dry with good blood return noted before and after infusion.  Band aid applied.  VSS with discharge and left in satisfactory condition with no s/s of distress noted.   

## 2021-08-01 NOTE — Patient Instructions (Signed)
[facility]  Discharge Instructions: Thank you for choosing [facility] to provide your oncology and hematology care.  If you have a lab appointment with the Cancer Center, please come in thru the Main Entrance and check in at the main information desk.  Wear comfortable clothing and clothing appropriate for easy access to any Portacath or PICC line.   We strive to give you quality time with your provider. You may need to reschedule your appointment if you arrive late (15 or more minutes).  Arriving late affects you and other patients whose appointments are after yours.  Also, if you miss three or more appointments without notifying the office, you may be dismissed from the clinic at the provider's discretion.      For prescription refill requests, have your pharmacy contact our office and allow 72 hours for refills to be completed.        To help prevent nausea and vomiting after your treatment, we encourage you to take your nausea medication as directed.  BELOW ARE SYMPTOMS THAT SHOULD BE REPORTED IMMEDIATELY: *FEVER GREATER THAN 100.4 F (38 C) OR HIGHER *CHILLS OR SWEATING *NAUSEA AND VOMITING THAT IS NOT CONTROLLED WITH YOUR NAUSEA MEDICATION *UNUSUAL SHORTNESS OF BREATH *UNUSUAL BRUISING OR BLEEDING *URINARY PROBLEMS (pain or burning when urinating, or frequent urination) *BOWEL PROBLEMS (unusual diarrhea, constipation, pain near the anus) TENDERNESS IN MOUTH AND THROAT WITH OR WITHOUT PRESENCE OF ULCERS (sore throat, sores in mouth, or a toothache) UNUSUAL RASH, SWELLING OR PAIN  UNUSUAL VAGINAL DISCHARGE OR ITCHING   Items with * indicate a potential emergency and should be followed up as soon as possible or go to the Emergency Department if any problems should occur.  Please show the CHEMOTHERAPY ALERT CARD or IMMUNOTHERAPY ALERT CARD at check-in to the Emergency Department and triage nurse.  Should you have questions after your visit or need to cancel  or reschedule your appointment, please contact [facility] [phone]  and follow the prompts.  Office hours are 8:00 a.m. to 4:30 p.m. Monday - Friday. Please note that voicemails left after 4:00 p.m. may not be returned until the following business day.  We are closed weekends and major holidays. You have access to a nurse at all times for urgent questions. Please call the main number to the clinic [phone] and follow the prompts.  For any non-urgent questions, you may also contact your provider using MyChart. We now offer e-Visits for anyone 18 and older to request care online for non-urgent symptoms. For details visit [email or web address].   Also download the MyChart app! Go to the app store, search "MyChart", open the app, select [facility], and log in with your MyChart username and password.  Due to Covid, a mask is required upon entering the hospital/clinic. If you do not have a mask, one will be given to you upon arrival. For doctor visits, patients may have 1 support person aged 18 or older with them. For treatment visits, patients cannot have anyone with them due to current Covid guidelines and our immunocompromised population.  

## 2021-08-04 ENCOUNTER — Other Ambulatory Visit: Payer: Self-pay

## 2021-08-04 ENCOUNTER — Encounter (HOSPITAL_COMMUNITY)
Admission: RE | Admit: 2021-08-04 | Discharge: 2021-08-04 | Disposition: A | Discharge status: Home / Self Care | Payer: Medicare Other | Source: Ambulatory Visit | Attending: Hematology | Admitting: Hematology

## 2021-08-04 DIAGNOSIS — D472 Monoclonal gammopathy: Secondary | ICD-10-CM | POA: Insufficient documentation

## 2021-08-04 MED ORDER — FLUDEOXYGLUCOSE F - 18 (FDG) INJECTION
6.0600 | Freq: Once | INTRAVENOUS | Status: AC | PRN
  Administered 2021-08-04: 6.06 via INTRAVENOUS

## 2021-08-09 ENCOUNTER — Other Ambulatory Visit: Payer: Self-pay

## 2021-08-09 ENCOUNTER — Encounter (HOSPITAL_COMMUNITY): Payer: Self-pay

## 2021-08-09 ENCOUNTER — Inpatient Hospital Stay (HOSPITAL_COMMUNITY): Payer: Medicare Other | Admitting: Hematology

## 2021-08-09 VITALS — BP 118/48 | HR 60 | Temp 96.6°F | Resp 19 | Ht 66.0 in | Wt 107.2 lb

## 2021-08-09 DIAGNOSIS — D509 Iron deficiency anemia, unspecified: Secondary | ICD-10-CM | POA: Diagnosis not present

## 2021-08-09 DIAGNOSIS — R918 Other nonspecific abnormal finding of lung field: Secondary | ICD-10-CM

## 2021-08-09 DIAGNOSIS — D472 Monoclonal gammopathy: Secondary | ICD-10-CM

## 2021-08-09 DIAGNOSIS — D649 Anemia, unspecified: Secondary | ICD-10-CM | POA: Diagnosis not present

## 2021-08-09 NOTE — Progress Notes (Signed)
[place], [place] [address]   CLINIC:  Medical Oncology/Hematology  PCP:  Carrolyn Meiers, MD [place] / [place] [address]  [phone]  REASON FOR VISIT:  Follow-up for MGUS  PRIOR THERAPY: none  CURRENT THERAPY: surveillance  INTERVAL HISTORY:  [name], a 87 y.o. female, returns for routine follow-up for her MGUS. Lisa [name] was last seen on 07/28/2021.  Today she reports feeling good.   REVIEW OF SYSTEMS:  Review of Systems  Constitutional:  Negative for appetite change and fatigue.  Genitourinary:  Positive for frequency.   Psychiatric/Behavioral:  Positive for sleep disturbance.   All other systems reviewed and are negative.  PAST MEDICAL/SURGICAL HISTORY:  Past Medical History:  Diagnosis Date   CKD (chronic kidney disease)    COPD (chronic obstructive pulmonary disease) ([place])    Hypertension    Pyelonephritis 04/06/2014   UTI   Past Surgical History:  Procedure Laterality Date   ABDOMINAL HYSTERECTOMY     CHOLECYSTECTOMY N/A 09/17/2018   Procedure: LAPAROSCOPIC CHOLECYSTECTOMY;  Surgeon: Virl Cagey, MD;  Location: [facility];  Service: General;  Laterality: N/A;   TEE WITHOUT CARDIOVERSION N/A 04/10/2014   Procedure: TRANSESOPHAGEAL ECHOCARDIOGRAM (TEE);  Surgeon: [name], MD;  Location: [facility] ENDOSCOPY;  Service: Cardiovascular;  Laterality: N/A;    SOCIAL HISTORY:  Social History   Socioeconomic History   Marital status: Widowed    Spouse name: Not on file   Number of children: Not on file   Years of education: Not on file   Highest education level: Not on file  Occupational History   Not on file  Tobacco Use   Smoking status: Every Day    Types: Cigarettes    Last attempt to quit: 05/12/2014    Years since quitting: 7.2   Smokeless tobacco: Never  Vaping Use   Vaping Use: Never used  Substance and Sexual Activity   Alcohol use: No   Drug use: No   Sexual  activity: Not on file  Other Topics Concern   Not on file  Social History Narrative   Not on file   Social Determinants of Health   Financial Resource Strain: Not on file  Food Insecurity: Not on file  Transportation Needs: Not on file  Physical Activity: Not on file  Stress: Not on file  Social Connections: Not on file  Intimate Partner Violence: Not on file    FAMILY HISTORY:  Family History  Problem Relation Age of Onset   Hypertension Mother    Thyroid disease Mother    Hypertension Father    Thyroid disease Sister    Thyroid disease Brother    Thyroid disease Sister     CURRENT MEDICATIONS:  Current Outpatient Medications  Medication Sig Dispense Refill   aspirin 81 MG EC tablet Take by mouth.     calcium carbonate (OS-CAL - DOSED IN MG OF ELEMENTAL CALCIUM) 1250 (500 Ca) MG tablet Take by mouth.     Cholecalciferol (VITAMIN D3) 125 MCG (5000 UT) CAPS Take 1 capsule by mouth daily.     diltiazem (CARDIZEM CD) 120 MG 24 hr capsule Take 120 mg by mouth daily.     feeding supplement, ENSURE COMPLETE, (ENSURE COMPLETE) LIQD Take 237 mLs by mouth 2 (two) times daily between meals.     furosemide (LASIX) 20 MG tablet Take 1 tablet by mouth 2 (two) times daily.     Ipratropium-Albuterol (COMBIVENT) 20-100 MCG/ACT  AERS respimat Inhale 1 puff into the lungs every 6 (six) hours as needed for wheezing or shortness of breath.      ipratropium-albuterol (DUONEB) 0.5-2.5 (3) MG/3ML SOLN Take 3 mLs by nebulization every 6 (six) hours as needed. For shortness of breath     labetalol (NORMODYNE) 300 MG tablet Take 300 mg by mouth 2 (two) times daily.     vitamin C (ASCORBIC ACID) 500 MG tablet Take 500 mg by mouth daily.     No current facility-administered medications for this visit.    ALLERGIES:  No Known Allergies  PHYSICAL EXAM:  Performance status (ECOG): 2 - Symptomatic, <50% confined to bed  There were no vitals filed for this visit. Wt Readings from Last 3 Encounters:   07/28/21 108 lb (49 kg)  07/21/21 108 lb 6.4 oz (49.2 kg)  07/14/21 105 lb (47.6 kg)   Physical Exam Vitals reviewed.  Constitutional:      Appearance: Normal appearance.     Comments: In wheelchair  Cardiovascular:     Rate and Rhythm: Normal rate and regular rhythm.     Pulses: Normal pulses.     Heart sounds: Normal heart sounds.  Pulmonary:     Effort: Pulmonary effort is normal.     Breath sounds: Normal breath sounds.  Neurological:     General: No focal deficit present.     Mental Status: She is alert and oriented to person, place, and time.  Psychiatric:        Mood and Affect: Mood normal.        Behavior: Behavior normal.    LABORATORY DATA:  I have reviewed the labs as listed.  CBC Latest Ref Rng & Units 07/14/2021 06/21/2021 05/26/2021  WBC 4.0 - 10.5 K/uL 3.7(L) 3.4(L) 2.7(L)  Hemoglobin 12.0 - 15.0 g/dL 10.8(L) 10.6(L) 10.1(L)  Hematocrit 36.0 - 46.0 % 33.3(L) 33.7(L) 31.7(L)  Platelets 150 - 400 K/uL 125(L) 127(L) 126(L)   CMP Latest Ref Rng & Units 06/21/2021 05/26/2021 02/07/2021  Glucose 70 - 99 mg/dL 96 104(H) 109(H)  BUN 8 - 23 mg/dL 46(H) 55(H) 55(H)  Creatinine 0.44 - 1.00 mg/dL 1.89(H) 1.97(H) 2.07(H)  Sodium 135 - 145 mmol/L 139 135 133(L)  Potassium 3.5 - 5.1 mmol/L 4.5 4.3 5.5(H)  Chloride 98 - 111 mmol/L 108 104 109  CO2 22 - 32 mmol/L 25 23 19(L)  Calcium 8.9 - 10.3 mg/dL 9.3 8.8(L) 8.8(L)  Total Protein 6.5 - 8.1 g/dL 8.5(H) 8.3(H) -  Total Bilirubin 0.3 - 1.2 mg/dL 0.4 0.2(L) -  Alkaline Phos 38 - 126 U/L 54 42 -  AST 15 - 41 U/L 27 31 -  ALT 0 - 44 U/L 19 24 -      Component Value Date/Time   RBC 3.53 (L) 07/14/2021 0912   MCV 94.3 07/14/2021 0912   MCH 30.6 07/14/2021 0912   MCHC 32.4 07/14/2021 0912   RDW 12.4 07/14/2021 0912   LYMPHSABS 1.0 07/14/2021 0912   MONOABS 0.3 07/14/2021 0912   EOSABS 0.0 07/14/2021 0912   BASOSABS 0.0 07/14/2021 0912    DIAGNOSTIC IMAGING:  I have independently reviewed the scans and discussed with  the patient. NM PET Image Initial (PI) Whole Body  Result Date: 08/08/2021 CLINICAL DATA:  Initial treatment strategy for MGUS. EXAM: NUCLEAR MEDICINE PET WHOLE BODY TECHNIQUE: 6.1 mCi F-18 FDG was injected intravenously. Full-ring PET imaging was performed from the head to foot after the radiotracer. CT data was obtained and used for attenuation correction  and anatomic localization. Fasting blood glucose: 108 mg/dl COMPARISON:  None. FINDINGS: Mediastinal blood pool activity: SUV max 2.5 HEAD/NECK: No hypermetabolic activity in the scalp. No hypermetabolic cervical lymph nodes. Incidental CT findings: There is massive enlargement of the LEFT and RIGHT lobes of the thyroid gland. Gland on the LEFT measures 4.8 cm and bulges to the skin surface. Gland on the RIGHT measures 3.9 cm. Both lobes of the gland extend into the upper mediastinum with greater extension on the RIGHT along the trachea and esophagus (image 23/8). This massive expansion of the thyroid gland does not have metabolic activity. CHEST: Within the apical region of the LEFT upper lobe 1.7 x 1.4 cm nodule with intense metabolic activity (SUV max equal 18.0) on image 98. No hypermetabolic mediastinal lymph nodes. Incidental CT findings: none ABDOMEN/PELVIS: No abnormal hypermetabolic activity within the liver, pancreas, adrenal glands, or spleen. No hypermetabolic lymph nodes in the abdomen or pelvis. Incidental CT findings: Atherosclerotic calcification of the aorta. No plasmacytoma identified SKELETON: No hypermetabolic lesions within the skeleton suggest active multiple myeloma. Incidental CT findings: No lytic lesion on CT portion exam. EXTREMITIES: No hypermetabolic lesions suggest skeletal metastasis. Incidental CT findings: No lytic lesion on CT portion exam. IMPRESSION: 1. No evidence of active multiple myeloma on FDG PET scan. No lytic lesion on CT portion exam. No soft tissue plasmacytoma identified. 2. Hypermetabolic nodule in the LEFT upper  lobe highly concerning for BRONCHOGENIC CARCINOMA. No evidence of metastatic disease. Recommend tissue sampling. 3. Massive thyroid goiter. These results will be called to the ordering clinician or representative by the Radiologist Assistant, and communication documented in the PACS or [employer]. Electronically Signed   By: Suzy Bouchard M.D.   On: 08/08/2021 08:18   CT Biopsy  Result Date: 07/14/2021 INDICATION: 87 year old female with history of MGUS. EXAM: CT-GUIDED BONE MARROW BIOPSY AND ASPIRATION MEDICATIONS: None ANESTHESIA/SEDATION: Fentanyl 100 mcg IV; Versed 0.5 mg IV Sedation Time: 10 minutes; The patient was continuously monitored during the procedure by the interventional radiology nurse under my direct supervision. COMPLICATIONS: None immediate. PROCEDURE: Informed consent was obtained from the patient following an explanation of the procedure, risks, benefits and alternatives. The patient understands, agrees and consents for the procedure. All questions were addressed. A time out was performed prior to the initiation of the procedure. The patient was positioned prone and non-contrast localization CT was performed of the pelvis to demonstrate the iliac marrow spaces. The operative site was prepped and draped in the usual sterile fashion. Under sterile conditions and local anesthesia, a 22 gauge spinal needle was utilized for procedural planning. Next, an 11 gauge coaxial bone biopsy needle was advanced into the right iliac marrow space. Needle position was confirmed with CT imaging. Initially, a bone marrow aspiration was performed. Next, a bone marrow biopsy was obtained with the 11 gauge outer bone marrow device. Samples were prepared with the cytotechnologist and deemed adequate. The needle was removed and superficial hemostasis was obtained with manual compression. A dressing was applied. The patient tolerated the procedure well without immediate post procedural complication.  IMPRESSION: Successful CT guided right iliac bone marrow aspiration and core biopsy. Ruthann Cancer, MD [facility] Radiology Electronically Signed   By: Ruthann Cancer M.D.   On: 07/14/2021 14:21   CT BONE MARROW BIOPSY & ASPIRATION  Result Date: 07/14/2021 INDICATION: 87 year old female with history of MGUS. EXAM: CT-GUIDED BONE MARROW BIOPSY AND ASPIRATION MEDICATIONS: None ANESTHESIA/SEDATION: Fentanyl 100 mcg IV; Versed 0.5 mg IV  Sedation Time: 10 minutes; The patient was continuously monitored during the procedure by the interventional radiology nurse under my direct supervision. COMPLICATIONS: None immediate. PROCEDURE: Informed consent was obtained from the patient following an explanation of the procedure, risks, benefits and alternatives. The patient understands, agrees and consents for the procedure. All questions were addressed. A time out was performed prior to the initiation of the procedure. The patient was positioned prone and non-contrast localization CT was performed of the pelvis to demonstrate the iliac marrow spaces. The operative site was prepped and draped in the usual sterile fashion. Under sterile conditions and local anesthesia, a 22 gauge spinal needle was utilized for procedural planning. Next, an 11 gauge coaxial bone biopsy needle was advanced into the right iliac marrow space. Needle position was confirmed with CT imaging. Initially, a bone marrow aspiration was performed. Next, a bone marrow biopsy was obtained with the 11 gauge outer bone marrow device. Samples were prepared with the cytotechnologist and deemed adequate. The needle was removed and superficial hemostasis was obtained with manual compression. A dressing was applied. The patient tolerated the procedure well without immediate post procedural complication. IMPRESSION: Successful CT guided right iliac bone marrow aspiration and core biopsy. Ruthann Cancer, MD [facility] Radiology Electronically Signed   By: Ruthann Cancer M.D.   On: 07/14/2021 14:21   [place] ELASTOGRAPHY LIVER  Result Date: 07/28/2021 CLINICAL DATA:  History of hepatitis C, evaluate for cirrhosis EXAM: US LIVER ELASTOGRAPHY TECHNIQUE: Sonography of the liver was performed. In addition, ultrasound elastography evaluation of the liver was performed. A region of interest was placed within the right lobe of the liver. Following application of a compressive sonographic pulse, tissue compressibility was assessed. Multiple assessments were performed at the selected site. Median tissue compressibility was determined. Previously, hepatic stiffness was assessed by shear wave velocity. Based on recently published Society of Radiologists in Ultrasound consensus article, reporting is now recommended to be performed in the SI units of pressure (kiloPascals) representing hepatic stiffness/elasticity. The obtained result is compared to the published reference standards. (cACLD = compensated Advanced Chronic Liver Disease) COMPARISON:  MRI September 13, 2018 FINDINGS: Liver: No focal lesion identified. Diffusely increased parenchymal echogenicity with some coarsening of the hepatic echotexture. Portal vein is patent on color Doppler imaging with normal direction of blood flow towards the liver. ULTRASOUND HEPATIC ELASTOGRAPHY Device: Siemens Helix VTQ Patient position: Supine Transducer: 5C1 Number of measurements: 10 Hepatic segment:  8 Median kPa: 5.3 IQR: 0.4 IQR/Median kPa ratio: 0.1 Data quality:  Good Diagnostic category: < or = 9 kPa: in the absence of other known clinical signs, rules out cACLD The use of hepatic elastography is applicable to patients with viral hepatitis and non-alcoholic fatty liver disease. At this time, there is insufficient data for the referenced cut-off values and use in other causes of liver disease, including alcoholic liver disease. Patients, however, may  be assessed by elastography and serve as their own reference standard/baseline. In patients with non-alcoholic liver disease, the values suggesting compensated advanced chronic liver disease (cACLD) may be lower, and patients may need additional testing with elasticity results of 7-9 kPa. Please note that abnormal hepatic elasticity and shear wave velocities may also be identified in clinical settings other than with hepatic fibrosis, such as: acute hepatitis, elevated right heart and central venous pressures including use of beta blockers, veno-occlusive disease (Budd-Chiari), infiltrative processes such as mastocytosis/amyloidosis/infiltrative tumor/lymphoma, extrahepatic cholestasis, with hyperemia in the post-prandial state, and with liver transplantation.  Correlation with patient history, laboratory data, and clinical condition recommended. Diagnostic Categories: < or =5 kPa: high probability of being normal < or =9 kPa: in the absence of other known clinical signs, rules out cACLD >9 kPa and ?13 kPa: suggestive of cACLD, but needs further testing >13 kPa: highly suggestive of cACLD > or =17 kPa: highly suggestive of cACLD with an increased probability of clinically significant portal hypertension IMPRESSION: ULTRASOUND LIVER: Diffusely increased hepatic parenchymal echogenicity with some coarsening of the echotexture, nonspecific most commonly reflecting hepatic steatosis and/or chronic hepatocellular disease. No overt hepatic lesion identified. ULTRASOUND HEPATIC ELASTOGRAPHY: Median kPa:  5.3 Diagnostic category: < or = 9 kPa: in the absence of other known clinical signs, rules out cACLD Electronically Signed   By: Dahlia Bailiff M.D.   On: 07/28/2021 10:34     ASSESSMENT:  - Patient seen at the request of Dr. Theador Hawthorne for monoclonal gammopathy. - Labs on 05/12/2021 showed M spike 2.4 g.  Immunofixation shows IgG kappa monoclonal protein with a faint IgA lambda monoclonal protein. - Kappa light chains  143, lambda light chains 38, ratio of 3.72. - 24-hour urine total protein 84 mg.  Urine immunofixation shows faint IgG kappa. - No B symptoms or neuropathy. - BMBX on 07/14/2021: Pathology shows hypocellular marrow with clonal plasma cell population comprising 15-20% of the total marrow cellularity.  No overt dysplasia. - Cytogenetics: 45,X-X[9]/46,XX[11] - Myeloma FISH panel: Monosomy 13, duplication 1 q. - Skeletal survey on 06/21/2021: 1 cm circumscribed sclerotic lesion in the distal tibial shaft on the right, nonspecific.  No lytic bone lesion.   Social/family history: - She lives with her nephew.  She is seen with her brother today.  She uses walker to ambulate. 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 - Current active smoker, half pack per day for 62 years.   PLAN:  IgG kappa smoldering myeloma: - We have reviewed PET scan from 08/04/2021 which did not show any evidence of active multiple myeloma.  No lytic lesion on the CT portion of the exam.  No soft tissue plasmacytoma was noted. - We I have discussed close monitoring of smoldering myeloma with myeloma labs once every 3 months and imaging once every year.  2.  PET positive left upper lobe lung nodule: -PET scan on 08/04/2021 done for evaluation of multiple myeloma showed incidental left upper lobe lung nodule measuring 1.7 x 1.4 cm with SUV 18.0.  No hypermetabolic mediastinal or hilar lymph nodes.  No evidence of metastatic disease. - We discussed the high likelihood of it being malignant given her smoking history. - Recommend CT-guided biopsy of the lung lesion.  If malignancy confirmed, we will make a referral to CT surgery.  We will order full PFTs.  If she is not a surgical candidate, will consider radiation therapy with SBRT.  3.  Normocytic anemia: - Combination anemia from CKD and relative iron deficiency. - Status post Feraheme 510 mg x 1 on 08/01/2021.  Orders placed this encounter:  No orders of the  defined types were placed in this encounter.    Derek Jack, MD [place] [phone]   I, Thana Ates, am acting as a scribe for Dr. Derek Jack.  I, Derek Jack MD, have reviewed the above documentation for accuracy and completeness, and I agree with the above.

## 2021-08-09 NOTE — Progress Notes (Signed)
Patient Name  Lisa, [name] Legal Sex  Female DOB  1935-05-17 SSN  [id number] Address  [address]  [place] [address] Phone  [phone] (Home) *Preferred*    RE: CT Biopsy Received: Today Suttle, Rosanne Ashing, MD  Valli Glance Would recommend multidisciplinary discussion for evaluation by Pulmonology for bronchoscopic guided biopsy.  If this is unavailable or not recommended for any reason, we could offer CT guided biopsy.   Dylan        Previous Messages   ----- Message -----  From: Valli Glance  Sent: 08/09/2021  10:24 AM EST  To: [facility] Procedure Requests  Subject: CT Biopsy                                       CT Biopsy         Reason: Lung mass           History:CT And [place] in computer, Pt had Bone Marrow Biopsy 07/14/21           Provider: Derek Jack          Contact: [phone]

## 2021-08-12 ENCOUNTER — Other Ambulatory Visit (HOSPITAL_COMMUNITY)
Admission: RE | Admit: 2021-08-12 | Discharge: 2021-08-12 | Disposition: A | Discharge status: Home / Self Care | Payer: Medicare Other | Source: Ambulatory Visit | Attending: Hematology | Admitting: Hematology

## 2021-08-12 DIAGNOSIS — Z20822 Contact with and (suspected) exposure to covid-19: Secondary | ICD-10-CM | POA: Diagnosis not present

## 2021-08-12 DIAGNOSIS — Z01818 Encounter for other preprocedural examination: Secondary | ICD-10-CM

## 2021-08-12 DIAGNOSIS — Z01812 Encounter for preprocedural laboratory examination: Secondary | ICD-10-CM | POA: Insufficient documentation

## 2021-08-13 LAB — SARS CORONAVIRUS 2 (TAT 6-24 HRS): SARS Coronavirus 2: NEGATIVE

## 2021-08-15 ENCOUNTER — Ambulatory Visit (HOSPITAL_COMMUNITY)
Admission: RE | Admit: 2021-08-15 | Discharge: 2021-08-15 | Disposition: A | Discharge status: Home / Self Care | Payer: Medicare Other | Source: Ambulatory Visit | Attending: Hematology | Admitting: Hematology

## 2021-08-15 ENCOUNTER — Other Ambulatory Visit: Payer: Self-pay

## 2021-08-15 DIAGNOSIS — R918 Other nonspecific abnormal finding of lung field: Secondary | ICD-10-CM

## 2021-08-15 LAB — PULMONARY FUNCTION TEST
DL/VA % pred: 59 %
DL/VA: 2.34 ml/min/mmHg/L
DLCO unc % pred: 33 %
DLCO unc: 6.78 ml/min/mmHg
FEF 25-75 Post: 0.14 L/sec
FEF 25-75 Pre: 0.2 L/sec
FEF2575-%Change-Post: -26 %
FEF2575-%Pred-Post: 10 %
FEF2575-%Pred-Pre: 14 %
FEV1-%Change-Post: -9 %
FEV1-%Pred-Post: 25 %
FEV1-%Pred-Pre: 28 %
FEV1-Post: 0.44 L
FEV1-Pre: 0.49 L
FEV1FVC-%Change-Post: 1 %
FEV1FVC-%Pred-Pre: 52 %
FEV6-%Change-Post: -10 %
FEV6-%Pred-Post: 47 %
FEV6-%Pred-Pre: 52 %
FEV6-Post: 1.01 L
FEV6-Pre: 1.12 L
FEV6FVC-%Change-Post: -1 %
FEV6FVC-%Pred-Post: 94 %
FEV6FVC-%Pred-Pre: 96 %
FVC-%Change-Post: -11 %
FVC-%Pred-Post: 50 %
FVC-%Pred-Pre: 56 %
FVC-Post: 1.11 L
FVC-Pre: 1.25 L
Post FEV1/FVC ratio: 40 %
Post FEV6/FVC ratio: 91 %
Pre FEV1/FVC ratio: 39 %
Pre FEV6/FVC Ratio: 92 %
RV % pred: 251 %
RV: 6.59 L
TLC % pred: 142 %
TLC: 7.82 L

## 2021-08-15 MED ORDER — ALBUTEROL SULFATE (2.5 MG/3ML) 0.083% IN NEBU
2.5000 mg | INHALATION_SOLUTION | Freq: Once | RESPIRATORY_TRACT | Status: AC
Start: 2021-08-15 — End: 2021-08-15
  Administered 2021-08-15: 2.5 mg via RESPIRATORY_TRACT

## 2021-08-22 ENCOUNTER — Other Ambulatory Visit: Payer: Self-pay

## 2021-08-22 ENCOUNTER — Ambulatory Visit (INDEPENDENT_AMBULATORY_CARE_PROVIDER_SITE_OTHER): Payer: Medicare Other | Admitting: Gastroenterology

## 2021-08-22 ENCOUNTER — Encounter (INDEPENDENT_AMBULATORY_CARE_PROVIDER_SITE_OTHER): Payer: Self-pay | Admitting: Gastroenterology

## 2021-08-22 VITALS — BP 181/67 | HR 66 | Temp 97.9°F | Ht 66.0 in | Wt 108.0 lb

## 2021-08-22 DIAGNOSIS — B1920 Unspecified viral hepatitis C without hepatic coma: Secondary | ICD-10-CM

## 2021-08-22 NOTE — Patient Instructions (Addendum)
Follow up with Dr. Delton Coombes regarding MGUS and lung nodule ?

## 2021-08-22 NOTE — Progress Notes (Signed)
Lisa [name], M.D. ?Gastroenterology & Hepatology ?[facility] ?[address] ?[place], [place] [address] ? ?Primary Care Physician: ?Carrolyn Meiers, MD ?[address] ?[place] [address] ? ?I will communicate my assessment and recommendations to the referring MD via EMR. ? ?Problems: ?Chronic hepatitis C genotype 1b ? ?History of Present Illness: ?Lisa [name] is a 87 y.o. female with past medical history of COPD, hypertension, CKD stage IV, MGUS, who presents for follow up of hepatitis C. ? ?The patient was last seen on 07/21/2021. At that time, the patient had stool cards given to evaluate for blood losses. Was ordered a liver elastography which showed kPa 5.3 and coarsening of the echotexture but no nodularity. Hepatitis C genotype was checked - 1b, Was neg for Hep c Ab. ? ?Patient comes today to discuss candidacy for hepatitis C treatment.  Denies having any complaints and states feeling fine.  Notably, she had a recent PET on 07/2021 showing a lung nodule with concern for malignancy, she is scheduled to undergo biopsy this area.  The patient denies having any nausea, vomiting, fever, chills, hematochezia, melena, hematemesis, abdominal distention, abdominal pain, diarrhea, jaundice, pruritus or weight loss. ? ?Most recent labs from 06/21/2021 showed AST of 27, ALT of 19, albumin of 3.4, alkaline phosphatase 54, creatinine of 1.89, BUN of 46, sodium 139, potassium 4.5, total bilirubin 0.4, iron 45, ferritin 335, saturation of 14%, most recent CBC 07/14/2021. ? ?Last Colonoscopy: 15-20 years ago, normal per patient  ?Last Endoscopy:never ? ?Past Medical History: ?Past Medical History:  ?Diagnosis Date  ? CKD (chronic kidney disease)   ? COPD (chronic obstructive pulmonary disease) ([facility])   ? Hypertension   ? Pyelonephritis 04/06/2014  ? UTI  ? ? ?Past Surgical History: ?Past Surgical History:  ?Procedure Laterality Date  ? ABDOMINAL  HYSTERECTOMY    ? CHOLECYSTECTOMY N/A 09/17/2018  ? Procedure: LAPAROSCOPIC CHOLECYSTECTOMY;  Surgeon: Virl Cagey, MD;  Location: [facility];  Service: General;  Laterality: N/A;  ? TEE WITHOUT CARDIOVERSION N/A 04/10/2014  ? Procedure: TRANSESOPHAGEAL ECHOCARDIOGRAM (TEE);  Surgeon: [name], MD;  Location: [facility] ENDOSCOPY;  Service: Cardiovascular;  Laterality: N/A;  ? ? ?Family History: ?Family History  ?Problem Relation Age of Onset  ? Hypertension Mother   ? Thyroid disease Mother   ? Hypertension Father   ? Thyroid disease Sister   ? Thyroid disease Brother   ? Thyroid disease Sister   ? ? ?Social History: ?Social History  ? ?Tobacco Use  ?Smoking Status Every Day  ? Packs/day: 0.25  ? Types: Cigarettes  ? Last attempt to quit: 05/12/2014  ? Years since quitting: 7.2  ?Smokeless Tobacco Never  ? ?Social History  ? ?Substance and Sexual Activity  ?Alcohol Use No  ? ?Social History  ? ?Substance and Sexual Activity  ?Drug Use No  ? ? ?Allergies: ?No Known Allergies ? ?Medications: ?Current Outpatient Medications  ?Medication Sig Dispense Refill  ? aspirin 81 MG EC tablet Take by mouth.    ? calcium carbonate (OS-CAL - DOSED IN MG OF ELEMENTAL CALCIUM) 1250 (500 Ca) MG tablet Take by mouth daily with breakfast.    ? Cholecalciferol (VITAMIN D3) 125 MCG (5000 UT) CAPS Take 1 capsule by mouth daily.    ? diltiazem (CARDIZEM CD) 120 MG 24 hr capsule Take 120 mg by mouth daily.    ? feeding supplement, ENSURE COMPLETE, (ENSURE COMPLETE) LIQD Take 237 mLs by mouth 2 (two) times daily between  meals.    ? furosemide (LASIX) 20 MG tablet Take 1 tablet by mouth 2 (two) times daily.    ? Ipratropium-Albuterol (COMBIVENT) 20-100 MCG/ACT AERS respimat Inhale 1 puff into the lungs every 6 (six) hours as needed for wheezing or shortness of breath.     ? labetalol (NORMODYNE) 300 MG tablet Take 300 mg by mouth 2 (two) times daily.    ? vitamin C (ASCORBIC ACID) 500 MG tablet Take 500 mg by mouth daily.    ? ?No current  facility-administered medications for this visit.  ? ? ?Review of Systems: ?GENERAL: negative for malaise, night sweats ?HEENT: No changes in hearing or vision, no nose bleeds or other nasal problems. ?NECK: Negative for lumps, goiter, pain and significant neck swelling ?RESPIRATORY: Negative for cough, wheezing ?CARDIOVASCULAR: Negative for chest pain, leg swelling, palpitations, orthopnea ?GI: SEE HPI ?MUSCULOSKELETAL: Negative for joint pain or swelling, back pain, and muscle pain. ?SKIN: Negative for lesions, rash ?PSYCH: Negative for sleep disturbance, mood disorder and recent psychosocial stressors. ?HEMATOLOGY Negative for prolonged bleeding, bruising easily, and swollen nodes. ?ENDOCRINE: Negative for cold or heat intolerance, polyuria, polydipsia and goiter. ?NEURO: negative for tremor, gait imbalance, syncope and seizures. ?The remainder of the review of systems is noncontributory. ? ? ?Physical Exam: ?BP (!) 181/67 (BP Location: Left Arm, Patient Position: Sitting, Cuff Size: Small)   Pulse 66   Temp 97.9 ?F (36.6 ?C) (Oral)   Ht 5\' 6"  (1.676 m)   Wt 108 lb (49 kg)   BMI 17.43 kg/m?  ?GENERAL: The patient is AO x3, in no acute distress. Elder. ?HEENT: Head is normocephalic and atraumatic. EOMI are intact. Mouth is well hydrated and without lesions. ?NECK: Supple. No masses ?LUNGS: Clear to auscultation. No presence of rhonchi/wheezing/rales. Adequate chest expansion ?HEART: RRR, normal s1 and s2. ?ABDOMEN: Soft, nontender, no guarding, no peritoneal signs, and nondistended. BS +. No masses. ?EXTREMITIES: Without any cyanosis, clubbing, rash, lesions or edema. ?NEUROLOGIC: AOx3, no focal motor deficit. ?SKIN: no jaundice, no rashes ? ?Imaging/Labs: ?as above ? ?I personally reviewed and interpreted the available labs, imaging and endoscopic files. ? ?Impression and Plan: ?Lisa [name] is a 87 y.o. female with past medical history of COPD, hypertension, CKD stage IV, MGUS, who presents for follow  up of hepatitis C.  The patient has presented chronic history of hepatitis C with unclear duration.  Notably she had normal liver enzymes during her most recent blood work-up.  Did not have evidence of advanced fibrosis or cirrhosis in her most recent elastography.  I had a thorough discussion with the patient and family members today for close to 30 minutes regarding the fact that the patient has multiple comorbidities and no presence of extraintestinal manifestations of hepatitis C or areas of advanced renal fibrosis/elevated liver function test, which will not make her a suitable candidate for DAA.  After thorough discussion, the patient agreed that she would like to hold off on starting any treatment for hepatitis C given the fact she has not presented any of the aforementioned abnormalities and given her potential life expectancy.  No further management will be performed for her hepatitis C, unless she develops any of the previously mentioned conditions. ? ?- No further management will be performed for her hepatitis C, unless she develops advanced liver fibrosis/cirrhosis, markedly elevated liver enzymes or extraintestinal manifestations of hepatitis C ?- Follow up with Dr. Delton Coombes regarding MGUS and lung nodule ? ?All questions were answered.     ? ?  Harvel Quale, MD ?Gastroenterology and Hepatology ?[facility] ? ?

## 2021-08-24 ENCOUNTER — Encounter: Payer: Self-pay | Admitting: Thoracic Surgery (Cardiothoracic Vascular Surgery)

## 2021-08-24 ENCOUNTER — Ambulatory Visit: Payer: Medicare Other | Admitting: Thoracic Surgery (Cardiothoracic Vascular Surgery)

## 2021-08-24 ENCOUNTER — Other Ambulatory Visit: Payer: Self-pay

## 2021-08-24 ENCOUNTER — Other Ambulatory Visit: Payer: Self-pay | Admitting: *Deleted

## 2021-08-24 VITALS — BP 149/47 | HR 63 | Resp 20 | Ht 66.0 in | Wt 108.0 lb

## 2021-08-24 DIAGNOSIS — R911 Solitary pulmonary nodule: Secondary | ICD-10-CM | POA: Diagnosis not present

## 2021-08-24 NOTE — Progress Notes (Signed)
PCP is Fanta, Tesfaye Demissie, MD ?Referring Provider is Katragadda, Sreedhar, MD ? ?Chief Complaint  ?Patient presents with  ? Lung Lesion  ?  Surgical consult, PET Scan 08/08/21, PFT's 08/15/21,   ? ? ?HPI: [name] is sent for consultation regarding a left upper lobe lung mass. ? ?Lisa [name] is an 87-year-old woman with a long history of tobacco abuse.  Her past medical history is also significant for COPD, hypertension, stage IV chronic kidney disease, gallstone pancreatitis, severe protein calorie malnutrition, hepatitis C, and thyroid goiter.  She was recently being evaluated for MGUS.  As part of her work-up she had a PET/CT which showed a 1.7 cm spiculated nodule in the left upper lobe with an SUV of 18. ? ?She has a 70-pack-year history of smoking.  She is currently smoking about 1/3 pack of cigarettes daily.  She is in a wheelchair and on her office visit today.  Her activities are very limited due to shortness of breath.  She has frequent wheezing.  She has not had any significant weight loss. ? ?Zubrod Score: ?At the time of surgery this patient?s most appropriate activity status/level should be described as: ?[]    0    Normal activity, no symptoms ?[]    1    Restricted in physical strenuous activity but ambulatory, able to do out light work ?[]    2    Ambulatory and capable of self care, unable to do work activities, up and about >50 % of waking hours                              ?[x]    3    Only limited self care, in bed greater than 50% of waking hours ?[]    4    Completely disabled, no self care, confined to bed or chair ?[]    5    Moribund ? ?Past Medical History:  ?Diagnosis Date  ? CKD (chronic kidney disease)   ? COPD (chronic obstructive pulmonary disease) ([facility])   ? Hypertension   ? Pyelonephritis 04/06/2014  ? UTI  ? ? ?Past Surgical History:  ?Procedure Laterality Date  ? ABDOMINAL HYSTERECTOMY    ? CHOLECYSTECTOMY N/A 09/17/2018  ? Procedure: LAPAROSCOPIC CHOLECYSTECTOMY;   Surgeon: Bridges, Lindsay C, MD;  Location: [facility];  Service: General;  Laterality: N/A;  ? TEE WITHOUT CARDIOVERSION N/A 04/10/2014  ? Procedure: TRANSESOPHAGEAL ECHOCARDIOGRAM (TEE);  Surgeon: Mark Skains, MD;  Location: [facility] ENDOSCOPY;  Service: Cardiovascular;  Laterality: N/A;  ? ? ?Family History  ?Problem Relation Age of Onset  ? Hypertension Mother   ? Thyroid disease Mother   ? Hypertension Father   ? Thyroid disease Sister   ? Thyroid disease Brother   ? Thyroid disease Sister   ? ? ?Social History ?Social History  ? ?Tobacco Use  ? Smoking status: Every Day  ?  Packs/day: 0.25  ?  Types: Cigarettes  ?  Last attempt to quit: 05/12/2014  ?  Years since quitting: 7.2  ? Smokeless tobacco: Never  ?Vaping Use  ? Vaping Use: Never used  ?Substance Use Topics  ? Alcohol use: No  ? Drug use: No  ? ? ?Current Outpatient Medications  ?Medication Sig Dispense Refill  ? aspirin 81 MG EC tablet Take by mouth.    ? calcium carbonate (OS-CAL - DOSED IN MG OF ELEMENTAL CALCIUM) 1250 (500 Ca) MG tablet Take   by mouth daily with breakfast.    ? Cholecalciferol (VITAMIN D3) 125 MCG (5000 UT) CAPS Take 1 capsule by mouth daily.    ? diltiazem (CARDIZEM CD) 120 MG 24 hr capsule Take 120 mg by mouth daily.    ? feeding supplement, ENSURE COMPLETE, (ENSURE COMPLETE) LIQD Take 237 mLs by mouth 2 (two) times daily between meals.    ? furosemide (LASIX) 20 MG tablet Take 1 tablet by mouth 2 (two) times daily.    ? Ipratropium-Albuterol (COMBIVENT) 20-100 MCG/ACT AERS respimat Inhale 1 puff into the lungs every 6 (six) hours as needed for wheezing or shortness of breath.     ? labetalol (NORMODYNE) 300 MG tablet Take 300 mg by mouth 2 (two) times daily.    ? vitamin C (ASCORBIC ACID) 500 MG tablet Take 500 mg by mouth daily.    ? ?No current facility-administered medications for this visit.  ? ? ?No Known Allergies ? ?Review of Systems  ?Constitutional:  Positive for activity change and fatigue. Negative for appetite change.  ?HENT:   Positive for dental problem. Negative for trouble swallowing and voice change.   ?Eyes:  Positive for visual disturbance.  ?Respiratory:  Positive for cough, shortness of breath and wheezing.   ?Cardiovascular:  Positive for leg swelling. Negative for chest pain.  ?Genitourinary:  Positive for frequency.  ?Musculoskeletal:  Positive for arthralgias and gait problem.  ?Neurological:  Positive for dizziness.  ?All other systems reviewed and are negative. ? ?BP (!) 149/47   Pulse 63   Resp 20   Ht 5' 6" (1.676 m)   Wt 108 lb (49 kg)   SpO2 99% Comment: RA  BMI 17.43 kg/m?  ?Physical Exam ?Vitals reviewed.  ?Constitutional:   ?   Comments: Frail appearing cachectic 87-year-old woman in no acute distress  ?HENT:  ?   Head: Normocephalic and atraumatic.  ?Eyes:  ?   General: No scleral icterus. ?   Extraocular Movements: Extraocular movements intact.  ?Neck:  ?   Comments: Large goiter ?Cardiovascular:  ?   Rate and Rhythm: Normal rate and regular rhythm.  ?   Heart sounds: Murmur heard.  ?Pulmonary:  ?   Effort: Pulmonary effort is normal. No respiratory distress.  ?   Breath sounds: Wheezing present.  ?Abdominal:  ?   General: There is no distension.  ?   Palpations: Abdomen is soft.  ?Musculoskeletal:  ?   Cervical back: Neck supple.  ?Lymphadenopathy:  ?   Cervical: No cervical adenopathy.  ?Skin: ?   General: Skin is warm and dry.  ?Neurological:  ?   General: No focal deficit present.  ?   Mental Status: She is oriented to person, place, and time.  ?   Cranial Nerves: No cranial nerve deficit.  ? ? ?Diagnostic Tests: ?NUCLEAR MEDICINE PET WHOLE BODY ?  ?TECHNIQUE: ?6.1 mCi F-18 FDG was injected intravenously. Full-ring PET imaging ?was performed from the head to foot after the radiotracer. CT data ?was obtained and used for attenuation correction and anatomic ?localization. ?  ?Fasting blood glucose: 108 mg/dl ?  ?COMPARISON:  None. ?  ?FINDINGS: ?Mediastinal blood pool activity: SUV max 2.5 ?  ?HEAD/NECK: No  hypermetabolic activity in the scalp. No ?hypermetabolic cervical lymph nodes. ?  ?Incidental CT findings: There is massive enlargement of the LEFT and ?RIGHT lobes of the thyroid gland. Gland on the LEFT measures 4.8 cm ?and bulges to the skin surface. Gland on the RIGHT measures 3.9 cm. ?Both lobes   of the gland extend into the upper mediastinum with ?greater extension on the RIGHT along the trachea and esophagus ?(image 23/8). This massive expansion of the thyroid gland does not ?have metabolic activity. ?  ?CHEST: Within the apical region of the LEFT upper lobe 1.7 x 1.4 cm ?nodule with intense metabolic activity (SUV max equal 18.0) on image ?98. ?  ?No hypermetabolic mediastinal lymph nodes. ?  ?Incidental CT findings: none ?  ?ABDOMEN/PELVIS: No abnormal hypermetabolic activity within the ?liver, pancreas, adrenal glands, or spleen. No hypermetabolic lymph ?nodes in the abdomen or pelvis. ?  ?Incidental CT findings: Atherosclerotic calcification of the aorta. ?No plasmacytoma identified ?  ?SKELETON: No hypermetabolic lesions within the skeleton suggest ?active multiple myeloma. ?  ?Incidental CT findings: No lytic lesion on CT portion exam. ?  ?EXTREMITIES: No hypermetabolic lesions suggest skeletal metastasis. ?  ?Incidental CT findings: No lytic lesion on CT portion exam. ?  ?IMPRESSION: ?1. No evidence of active multiple myeloma on FDG PET scan. No lytic ?lesion on CT portion exam. No soft tissue plasmacytoma identified. ?2. Hypermetabolic nodule in the LEFT upper lobe highly concerning ?for BRONCHOGENIC CARCINOMA. No evidence of metastatic disease. ?Recommend tissue sampling. ?3. Massive thyroid goiter. ?  ?These results will be called to the ordering clinician or ?representative by the Radiologist Assistant, and communication ?documented in the PACS or [facility]. ?  ?  ?Electronically Signed ?  By: Stewart  Edmunds M.D. ?  On: 08/08/2021 08:18 ?I personally reviewed the PET/CT images.  There is a  1.7 x 1.4 cm spiculated left upper lobe nodule that is markedly hypermetabolic.  Large goiter.  No mediastinal or hilar adenopathy. ? ?Pulmonary function testing ?FVC 1.25 (56%) ?FEV1 0.49 (28%) ?DLCO 6.78 (33%

## 2021-08-24 NOTE — H&P (View-Only) (Signed)
PCP is Carrolyn Meiers, MD ?Referring Provider is Derek Jack, MD ? ?Chief Complaint  ?Patient presents with  ? Lung Lesion  ?  Surgical consult, PET Scan 08/08/21, PFT's 08/15/21,   ? ? ?HPI: Lisa [name] is sent for consultation regarding a left upper lobe lung mass. ? ?Lisa [name] is an 87 year old woman with a long history of tobacco abuse.  Her past medical history is also significant for COPD, hypertension, stage IV chronic kidney disease, gallstone pancreatitis, severe protein calorie malnutrition, hepatitis C, and thyroid goiter.  She was recently being evaluated for MGUS.  As part of her work-up she had a PET/CT which showed a 1.7 cm spiculated nodule in the left upper lobe with an SUV of 18. ? ?She has a 70-pack-year history of smoking.  She is currently smoking about 1/3 pack of cigarettes daily.  She is in a wheelchair and on her office visit today.  Her activities are very limited due to shortness of breath.  She has frequent wheezing.  She has not had any significant weight loss. ? ?Zubrod Score: ?At the time of surgery this patient?s most appropriate activity status/level should be described as: ?_0     0    Normal activity, no symptoms ?_1     1    Restricted in physical strenuous activity but ambulatory, able to do out light work ?_2     2    Ambulatory and capable of self care, unable to do work activities, up and about >50 % of waking hours                              ?_3     3    Only limited self care, in bed greater than 50% of waking hours ?_4     4    Completely disabled, no self care, confined to bed or chair ?_5     5    Moribund ? ?Past Medical History:  ?Diagnosis Date  ? CKD (chronic kidney disease)   ? COPD (chronic obstructive pulmonary disease) ([place])   ? Hypertension   ? Pyelonephritis 04/06/2014  ? UTI  ? ? ?Past Surgical History:  ?Procedure Laterality Date  ? ABDOMINAL HYSTERECTOMY    ? CHOLECYSTECTOMY N/A 09/17/2018  ? Procedure: LAPAROSCOPIC CHOLECYSTECTOMY;   Surgeon: Virl Cagey, MD;  Location: [facility];  Service: General;  Laterality: N/A;  ? TEE WITHOUT CARDIOVERSION N/A 04/10/2014  ? Procedure: TRANSESOPHAGEAL ECHOCARDIOGRAM (TEE);  Surgeon: [name], MD;  Location: [facility] ENDOSCOPY;  Service: Cardiovascular;  Laterality: N/A;  ? ? ?Family History  ?Problem Relation Age of Onset  ? Hypertension Mother   ? Thyroid disease Mother   ? Hypertension Father   ? Thyroid disease Sister   ? Thyroid disease Brother   ? Thyroid disease Sister   ? ? ?Social History ?Social History  ? ?Tobacco Use  ? Smoking status: Every Day  ?  Packs/day: 0.25  ?  Types: Cigarettes  ?  Last attempt to quit: 05/12/2014  ?  Years since quitting: 7.2  ? Smokeless tobacco: Never  ?Vaping Use  ? Vaping Use: Never used  ?Substance Use Topics  ? Alcohol use: No  ? Drug use: No  ? ? ?Current Outpatient Medications  ?Medication Sig Dispense Refill  ? aspirin 81 MG EC tablet Take by mouth.    ? calcium carbonate (OS-CAL - DOSED IN MG OF ELEMENTAL CALCIUM) 1250 (500 Ca) MG tablet Take  by mouth daily with breakfast.    ? Cholecalciferol (VITAMIN D3) 125 MCG (5000 UT) CAPS Take 1 capsule by mouth daily.    ? diltiazem (CARDIZEM CD) 120 MG 24 hr capsule Take 120 mg by mouth daily.    ? feeding supplement, ENSURE COMPLETE, (ENSURE COMPLETE) LIQD Take 237 mLs by mouth 2 (two) times daily between meals.    ? furosemide (LASIX) 20 MG tablet Take 1 tablet by mouth 2 (two) times daily.    ? Ipratropium-Albuterol (COMBIVENT) 20-100 MCG/ACT AERS respimat Inhale 1 puff into the lungs every 6 (six) hours as needed for wheezing or shortness of breath.     ? labetalol (NORMODYNE) 300 MG tablet Take 300 mg by mouth 2 (two) times daily.    ? vitamin C (ASCORBIC ACID) 500 MG tablet Take 500 mg by mouth daily.    ? ?No current facility-administered medications for this visit.  ? ? ?No Known Allergies ? ?Review of Systems  ?Constitutional:  Positive for activity change and fatigue. Negative for appetite change.  ?HENT:   Positive for dental problem. Negative for trouble swallowing and voice change.   ?Eyes:  Positive for visual disturbance.  ?Respiratory:  Positive for cough, shortness of breath and wheezing.   ?Cardiovascular:  Positive for leg swelling. Negative for chest pain.  ?Genitourinary:  Positive for frequency.  ?Musculoskeletal:  Positive for arthralgias and gait problem.  ?Neurological:  Positive for dizziness.  ?All other systems reviewed and are negative. ? ?BP (!) 149/47   Pulse 63   Resp 20   Ht _0  (1.676 m)   Wt 108 lb (49 kg)   SpO2 99% Comment: RA  BMI 17.43 kg/m?  ?Physical Exam ?Vitals reviewed.  ?Constitutional:   ?   Comments: Frail appearing cachectic 87 year old woman in no acute distress  ?HENT:  ?   Head: Normocephalic and atraumatic.  ?Eyes:  ?   General: No scleral icterus. ?   Extraocular Movements: Extraocular movements intact.  ?Neck:  ?   Comments: Large goiter ?Cardiovascular:  ?   Rate and Rhythm: Normal rate and regular rhythm.  ?   Heart sounds: Murmur heard.  ?Pulmonary:  ?   Effort: Pulmonary effort is normal. No respiratory distress.  ?   Breath sounds: Wheezing present.  ?Abdominal:  ?   General: There is no distension.  ?   Palpations: Abdomen is soft.  ?Musculoskeletal:  ?   Cervical back: Neck supple.  ?Lymphadenopathy:  ?   Cervical: No cervical adenopathy.  ?Skin: ?   General: Skin is warm and dry.  ?Neurological:  ?   General: No focal deficit present.  ?   Mental Status: She is oriented to person, place, and time.  ?   Cranial Nerves: No cranial nerve deficit.  ? ? ?Diagnostic Tests: ?NUCLEAR MEDICINE PET WHOLE BODY ?  ?TECHNIQUE: ?6.1 mCi F-18 FDG was injected intravenously. Full-ring PET imaging ?was performed from the head to foot after the radiotracer. CT data ?was obtained and used for attenuation correction and anatomic ?localization. ?  ?Fasting blood glucose: 108 mg/dl ?  ?COMPARISON:  None. ?  ?FINDINGS: ?Mediastinal blood pool activity: SUV max 2.5 ?  ?HEAD/NECK: No  hypermetabolic activity in the scalp. No ?hypermetabolic cervical lymph nodes. ?  ?Incidental CT findings: There is massive enlargement of the LEFT and ?RIGHT lobes of the thyroid gland. Gland on the LEFT measures 4.8 cm ?and bulges to the skin surface. Gland on the RIGHT measures 3.9 cm. ?Both lobes  of the gland extend into the upper mediastinum with ?greater extension on the RIGHT along the trachea and esophagus ?(image 23/8). This massive expansion of the thyroid gland does not ?have metabolic activity. ?  ?CHEST: Within the apical region of the LEFT upper lobe 1.7 x 1.4 cm ?nodule with intense metabolic activity (SUV max equal 18.0) on image ?98. ?  ?No hypermetabolic mediastinal lymph nodes. ?  ?Incidental CT findings: none ?  ?ABDOMEN/PELVIS: No abnormal hypermetabolic activity within the ?liver, pancreas, adrenal glands, or spleen. No hypermetabolic lymph ?nodes in the abdomen or pelvis. ?  ?Incidental CT findings: Atherosclerotic calcification of the aorta. ?No plasmacytoma identified ?  ?SKELETON: No hypermetabolic lesions within the skeleton suggest ?active multiple myeloma. ?  ?Incidental CT findings: No lytic lesion on CT portion exam. ?  ?EXTREMITIES: No hypermetabolic lesions suggest skeletal metastasis. ?  ?Incidental CT findings: No lytic lesion on CT portion exam. ?  ?IMPRESSION: ?1. No evidence of active multiple myeloma on FDG PET scan. No lytic ?lesion on CT portion exam. No soft tissue plasmacytoma identified. ?2. Hypermetabolic nodule in the LEFT upper lobe highly concerning ?for BRONCHOGENIC CARCINOMA. No evidence of metastatic disease. ?Recommend tissue sampling. ?3. Massive thyroid goiter. ?  ?These results will be called to the ordering clinician or ?representative by the Radiologist Assistant, and communication ?documented in the PACS or [employer]. ?  ?  ?Electronically Signed ?  By: Suzy Bouchard M.D. ?  On: 08/08/2021 08:18 ?I personally reviewed the PET/CT images.  There is a  1.7 x 1.4 cm spiculated left upper lobe nodule that is markedly hypermetabolic.  Large goiter.  No mediastinal or hilar adenopathy. ? ?Pulmonary function testing ?FVC 1.25 (56%) ?FEV1 0.49 (28%) ?DLCO 6.78 (33%

## 2021-08-25 ENCOUNTER — Other Ambulatory Visit: Payer: Self-pay | Admitting: *Deleted

## 2021-08-25 ENCOUNTER — Encounter: Payer: Self-pay | Admitting: *Deleted

## 2021-08-26 ENCOUNTER — Other Ambulatory Visit: Payer: Self-pay

## 2021-08-26 ENCOUNTER — Ambulatory Visit
Admission: RE | Admit: 2021-08-26 | Discharge: 2021-08-26 | Disposition: A | Discharge status: Home / Self Care | Payer: Medicare Other | Source: Ambulatory Visit | Attending: Thoracic Surgery (Cardiothoracic Vascular Surgery) | Admitting: Thoracic Surgery (Cardiothoracic Vascular Surgery)

## 2021-08-26 DIAGNOSIS — R911 Solitary pulmonary nodule: Secondary | ICD-10-CM

## 2021-08-26 DIAGNOSIS — J439 Emphysema, unspecified: Secondary | ICD-10-CM | POA: Diagnosis not present

## 2021-08-26 DIAGNOSIS — I700 Atherosclerosis of aorta: Secondary | ICD-10-CM | POA: Diagnosis not present

## 2021-08-29 NOTE — Pre-Procedure Instructions (Signed)
Surgical Instructions ? ? ? Your procedure is scheduled on Thursday 09/01/21. ? ? Report to [facility] Entrance "A" at 10:00 A.M., then check in with the Admitting office. ? Call this number if you have problems the morning of surgery: ? [phone] ? ? If you have any questions prior to your surgery date call [phone]: Open Monday-Friday 8am-4pm ? ? ? Remember: ? Do not eat or drink after midnight the night before your surgery ?  ? Take these medicines the morning of surgery with A SIP OF WATER:  ? diltiazem (CARDIZEM CD)  ? labetalol (NORMODYNE) ? ? Take these medicines if needed:  ? Ipratropium-Albuterol (COMBIVENT)  ? ipratropium-albuterol (DUONEB)  ? ? ?As of today, STOP taking any Aspirin (unless otherwise instructed by your surgeon) Aleve, Naproxen, Ibuprofen, Motrin, Advil, Goody's, BC's, all herbal medications, fish oil, and all vitamins. ? ?         ?Do not wear jewelry or makeup ?Do not wear lotions, powders, perfumes/colognes, or deodorant. ?Do not shave 48 hours prior to surgery.  Men may shave face and neck. ?Do not bring valuables to the hospital. ?Do not wear nail polish, gel polish, artificial nails, or any other type of covering on natural nails (fingers and toes) ?If you have artificial nails or gel coating that need to be removed by a nail salon, please have this removed prior to surgery. Artificial nails or gel coating may interfere with anesthesia's ability to adequately monitor your vital signs. ? ?[facility] is not responsible for any belongings or valuables. .  ? ?Do NOT Smoke (Tobacco/Vaping)  24 hours prior to your procedure ? ?If you use a CPAP at night, you may bring your mask for your overnight stay. ?  ?Contacts, glasses, hearing aids, dentures or partials may not be worn into surgery, please bring cases for these belongings ?  ?For patients admitted to the hospital, discharge time will be determined by your treatment team. ?  ?Patients discharged the day of surgery will not  be allowed to drive home, and someone needs to stay with them for 24 hours. ? ?NO VISITORS WILL BE ALLOWED IN PRE-OP WHERE PATIENTS ARE PREPPED FOR SURGERY.  ONLY 1 SUPPORT PERSON MAY BE PRESENT IN THE WAITING ROOM WHILE YOU ARE IN SURGERY.  IF YOU ARE TO BE ADMITTED, ONCE YOU ARE IN YOUR ROOM YOU WILL BE ALLOWED TWO (2) VISITORS. 1 (ONE) VISITOR MAY STAY OVERNIGHT BUT MUST ARRIVE TO THE ROOM BY 8pm.  Minor children may have two parents present. Special consideration for safety and communication needs will be reviewed on a case by case basis. ? ?Special instructions:   ? ?Oral Hygiene is also important to reduce your risk of infection.  Remember - BRUSH YOUR TEETH THE MORNING OF SURGERY WITH YOUR REGULAR TOOTHPASTE ? ? ?[facility]- Preparing For Surgery ? ?Before surgery, you can play an important role. Because skin is not sterile, your skin needs to be as free of germs as possible. You can reduce the number of germs on your skin by washing with CHG (chlorahexidine gluconate) Soap before surgery.  CHG is an antiseptic cleaner which kills germs and bonds with the skin to continue killing germs even after washing.   ? ? ?Please do not use if you have an allergy to CHG or antibacterial soaps. If your skin becomes reddened/irritated stop using the CHG.  ?Do not shave (including legs and underarms) for at least 48 hours prior to first CHG shower. It  is OK to shave your face. ? ?Please follow these instructions carefully. ?  ? ? Shower the NIGHT BEFORE SURGERY and the MORNING OF SURGERY with CHG Soap.  ? If you chose to wash your hair, wash your hair first as usual with your normal shampoo. After you shampoo, rinse your hair and body thoroughly to remove the shampoo.  Then [employer] and genitals (private parts) with your normal soap and rinse thoroughly to remove soap. ? ?After that Use CHG Soap as you would any other liquid soap. You can apply CHG directly to the skin and wash gently with a scrungie or a clean  washcloth.  ? ?Apply the CHG Soap to your body ONLY FROM THE NECK DOWN.  Do not use on open wounds or open sores. Avoid contact with your eyes, ears, mouth and genitals (private parts). Wash Face and genitals (private parts)  with your normal soap.  ? ?Wash thoroughly, paying special attention to the area where your surgery will be performed. ? ?Thoroughly rinse your body with warm water from the neck down. ? ?DO NOT shower/wash with your normal soap after using and rinsing off the CHG Soap. ? ?Pat yourself dry with a CLEAN TOWEL. ? ?Wear CLEAN PAJAMAS to bed the night before surgery ? ?Place CLEAN SHEETS on your bed the night before your surgery ? ?DO NOT SLEEP WITH PETS. ? ? ?Day of Surgery: ? ?Take a shower with CHG soap. ?Wear Clean/Comfortable clothing the morning of surgery ?Do not apply any deodorants/lotions.   ?Remember to brush your teeth WITH YOUR REGULAR TOOTHPASTE. ? ? ? ?COVID testing ? ?If you are going to stay overnight or be admitted after your procedure/surgery and require a pre-op COVID test, please follow these instructions after your COVID test  ? ?You are not required to quarantine however you are required to wear a well-fitting mask when you are out and around people not in your household.  If your mask becomes wet or soiled, replace with a new one. ? ?Wash your hands often with soap and water for 20 seconds or clean your hands with an alcohol-based hand sanitizer that contains at least 60% alcohol. ? ?Do not share personal items. ? ?Notify your provider: ?if you are in close contact with someone who has COVID  ?or if you develop a fever of 100.4 or greater, sneezing, cough, sore throat, shortness of breath or body aches. ? ?  ?Please read over the following fact sheets that you were given.  ? ?

## 2021-08-30 ENCOUNTER — Encounter (HOSPITAL_COMMUNITY): Payer: Self-pay

## 2021-08-30 ENCOUNTER — Encounter (HOSPITAL_COMMUNITY)
Admission: RE | Admit: 2021-08-30 | Discharge: 2021-08-30 | Disposition: A | Discharge status: Home / Self Care | Payer: Medicare Other | Source: Ambulatory Visit | Attending: Thoracic Surgery (Cardiothoracic Vascular Surgery) | Admitting: Thoracic Surgery (Cardiothoracic Vascular Surgery)

## 2021-08-30 ENCOUNTER — Ambulatory Visit (HOSPITAL_COMMUNITY)
Admission: RE | Admit: 2021-08-30 | Discharge: 2021-08-30 | Disposition: A | Discharge status: Home / Self Care | Payer: Medicare Other | Source: Ambulatory Visit | Attending: Thoracic Surgery (Cardiothoracic Vascular Surgery) | Admitting: Thoracic Surgery (Cardiothoracic Vascular Surgery)

## 2021-08-30 ENCOUNTER — Other Ambulatory Visit: Payer: Self-pay

## 2021-08-30 VITALS — BP 160/58 | HR 56 | Temp 97.8°F | Resp 18 | Ht 67.0 in | Wt 105.3 lb

## 2021-08-30 DIAGNOSIS — B182 Chronic viral hepatitis C: Secondary | ICD-10-CM | POA: Diagnosis present

## 2021-08-30 DIAGNOSIS — R1310 Dysphagia, unspecified: Secondary | ICD-10-CM | POA: Diagnosis not present

## 2021-08-30 DIAGNOSIS — R0603 Acute respiratory distress: Secondary | ICD-10-CM | POA: Diagnosis not present

## 2021-08-30 DIAGNOSIS — Z720 Tobacco use: Secondary | ICD-10-CM | POA: Diagnosis not present

## 2021-08-30 DIAGNOSIS — R911 Solitary pulmonary nodule: Secondary | ICD-10-CM

## 2021-08-30 DIAGNOSIS — I248 Other forms of acute ischemic heart disease: Secondary | ICD-10-CM | POA: Diagnosis not present

## 2021-08-30 DIAGNOSIS — I160 Hypertensive urgency: Secondary | ICD-10-CM | POA: Diagnosis not present

## 2021-08-30 DIAGNOSIS — Z20822 Contact with and (suspected) exposure to covid-19: Secondary | ICD-10-CM | POA: Insufficient documentation

## 2021-08-30 DIAGNOSIS — D630 Anemia in neoplastic disease: Secondary | ICD-10-CM | POA: Diagnosis not present

## 2021-08-30 DIAGNOSIS — R0602 Shortness of breath: Secondary | ICD-10-CM | POA: Diagnosis not present

## 2021-08-30 DIAGNOSIS — C9000 Multiple myeloma not having achieved remission: Secondary | ICD-10-CM | POA: Diagnosis not present

## 2021-08-30 DIAGNOSIS — Z79899 Other long term (current) drug therapy: Secondary | ICD-10-CM | POA: Diagnosis not present

## 2021-08-30 DIAGNOSIS — E049 Nontoxic goiter, unspecified: Secondary | ICD-10-CM | POA: Diagnosis not present

## 2021-08-30 DIAGNOSIS — I10 Essential (primary) hypertension: Secondary | ICD-10-CM | POA: Diagnosis not present

## 2021-08-30 DIAGNOSIS — R5381 Other malaise: Secondary | ICD-10-CM | POA: Diagnosis not present

## 2021-08-30 DIAGNOSIS — M4312 Spondylolisthesis, cervical region: Secondary | ICD-10-CM | POA: Diagnosis not present

## 2021-08-30 DIAGNOSIS — Z01818 Encounter for other preprocedural examination: Secondary | ICD-10-CM

## 2021-08-30 DIAGNOSIS — D631 Anemia in chronic kidney disease: Secondary | ICD-10-CM | POA: Diagnosis not present

## 2021-08-30 DIAGNOSIS — K224 Dyskinesia of esophagus: Secondary | ICD-10-CM | POA: Diagnosis not present

## 2021-08-30 DIAGNOSIS — F17210 Nicotine dependence, cigarettes, uncomplicated: Secondary | ICD-10-CM | POA: Diagnosis not present

## 2021-08-30 DIAGNOSIS — E042 Nontoxic multinodular goiter: Secondary | ICD-10-CM | POA: Diagnosis not present

## 2021-08-30 DIAGNOSIS — J449 Chronic obstructive pulmonary disease, unspecified: Secondary | ICD-10-CM | POA: Insufficient documentation

## 2021-08-30 DIAGNOSIS — R079 Chest pain, unspecified: Secondary | ICD-10-CM | POA: Diagnosis not present

## 2021-08-30 DIAGNOSIS — M47812 Spondylosis without myelopathy or radiculopathy, cervical region: Secondary | ICD-10-CM | POA: Diagnosis not present

## 2021-08-30 DIAGNOSIS — I471 Supraventricular tachycardia: Secondary | ICD-10-CM | POA: Diagnosis not present

## 2021-08-30 DIAGNOSIS — I129 Hypertensive chronic kidney disease with stage 1 through stage 4 chronic kidney disease, or unspecified chronic kidney disease: Secondary | ICD-10-CM | POA: Insufficient documentation

## 2021-08-30 DIAGNOSIS — J438 Other emphysema: Secondary | ICD-10-CM | POA: Diagnosis not present

## 2021-08-30 DIAGNOSIS — Z7982 Long term (current) use of aspirin: Secondary | ICD-10-CM | POA: Diagnosis not present

## 2021-08-30 DIAGNOSIS — I214 Non-ST elevation (NSTEMI) myocardial infarction: Secondary | ICD-10-CM | POA: Diagnosis not present

## 2021-08-30 DIAGNOSIS — Z8249 Family history of ischemic heart disease and other diseases of the circulatory system: Secondary | ICD-10-CM | POA: Diagnosis not present

## 2021-08-30 DIAGNOSIS — I209 Angina pectoris, unspecified: Secondary | ICD-10-CM | POA: Diagnosis not present

## 2021-08-30 DIAGNOSIS — R0789 Other chest pain: Secondary | ICD-10-CM | POA: Diagnosis not present

## 2021-08-30 DIAGNOSIS — N184 Chronic kidney disease, stage 4 (severe): Secondary | ICD-10-CM | POA: Insufficient documentation

## 2021-08-30 DIAGNOSIS — R0902 Hypoxemia: Secondary | ICD-10-CM | POA: Diagnosis not present

## 2021-08-30 DIAGNOSIS — Z8579 Personal history of other malignant neoplasms of lymphoid, hematopoietic and related tissues: Secondary | ICD-10-CM | POA: Diagnosis not present

## 2021-08-30 DIAGNOSIS — Z8349 Family history of other endocrine, nutritional and metabolic diseases: Secondary | ICD-10-CM | POA: Diagnosis not present

## 2021-08-30 DIAGNOSIS — R918 Other nonspecific abnormal finding of lung field: Secondary | ICD-10-CM | POA: Diagnosis not present

## 2021-08-30 DIAGNOSIS — C3412 Malignant neoplasm of upper lobe, left bronchus or lung: Secondary | ICD-10-CM | POA: Diagnosis not present

## 2021-08-30 HISTORY — DX: Dyspnea, unspecified: R06.00

## 2021-08-30 HISTORY — DX: Unspecified viral hepatitis C without hepatic coma: B19.20

## 2021-08-30 HISTORY — DX: Nontoxic diffuse goiter: E04.0

## 2021-08-30 HISTORY — DX: Occlusion and stenosis of unspecified carotid artery: I65.29

## 2021-08-30 HISTORY — DX: Monoclonal gammopathy: D47.2

## 2021-08-30 HISTORY — DX: Anemia, unspecified: D64.9

## 2021-08-30 LAB — COMPREHENSIVE METABOLIC PANEL
ALT: 17 U/L (ref 0–44)
AST: 27 U/L (ref 15–41)
Albumin: 3.2 g/dL — ABNORMAL LOW (ref 3.5–5.0)
Alkaline Phosphatase: 47 U/L (ref 38–126)
Anion gap: 11 (ref 5–15)
BUN: 40 mg/dL — ABNORMAL HIGH (ref 8–23)
CO2: 24 mmol/L (ref 22–32)
Calcium: 9.2 mg/dL (ref 8.9–10.3)
Chloride: 106 mmol/L (ref 98–111)
Creatinine, Ser: 1.67 mg/dL — ABNORMAL HIGH (ref 0.44–1.00)
GFR, Estimated: 30 mL/min — ABNORMAL LOW (ref >60)
Glucose, Bld: 94 mg/dL (ref 70–99)
Potassium: 4.4 mmol/L (ref 3.5–5.1)
Sodium: 141 mmol/L (ref 135–145)
Total Bilirubin: 0.6 mg/dL (ref 0.3–1.2)
Total Protein: 8.5 g/dL — ABNORMAL HIGH (ref 6.5–8.1)

## 2021-08-30 LAB — CBC
HCT: 33.8 % — ABNORMAL LOW (ref 36.0–46.0)
Hemoglobin: 10.8 g/dL — ABNORMAL LOW (ref 12.0–15.0)
MCH: 31 pg (ref 26.0–34.0)
MCHC: 32 g/dL (ref 30.0–36.0)
MCV: 97.1 fL (ref 80.0–100.0)
Platelets: 104 10*3/uL — ABNORMAL LOW (ref 150–400)
RBC: 3.48 MIL/uL — ABNORMAL LOW (ref 3.87–5.11)
RDW: 12.9 % (ref 11.5–15.5)
WBC: 3 10*3/uL — ABNORMAL LOW (ref 4.0–10.5)
nRBC: 0 % (ref 0.0–0.2)

## 2021-08-30 LAB — APTT: aPTT: 27 seconds (ref 24–36)

## 2021-08-30 LAB — SARS CORONAVIRUS 2 (TAT 6-24 HRS): SARS Coronavirus 2: NEGATIVE

## 2021-08-30 LAB — PROTIME-INR
INR: 1 (ref 0.8–1.2)
Prothrombin Time: 13.3 seconds (ref 11.4–15.2)

## 2021-08-30 NOTE — Progress Notes (Signed)
PCP - Dr. [name] ?Cardiologist -  ? ?PPM/ICD - n/a ?Device Orders - n/a ?Rep Notified - n/a ? ?Chest x-ray - 08/30/21 ?EKG - 05/30/21 ?Stress Test -  ?ECHO - 06/14/21 ?Cardiac Cath - patient denies ? ?Sleep Study - n/a ?CPAP - n/a ? ?Fasting Blood Sugar - n/a ?Checks Blood Sugar _____ times a day- n/a ? ?Blood Thinner Instructions: n/a ?Aspirin Instructions: As of today stop taking Aspirin unless otherwise instructed by your surgeon.  ? ?ERAS Protcol - n/a ?PRE-SURGERY Ensure or G2- n/a ? ?COVID TEST- 08/30/21. Pending.  ? ? ?Anesthesia review: Yes. A-fib.  ? ? ?Patient denies shortness of breath, fever, cough and chest pain at PAT appointment ? ? ?All instructions explained to the patient, with a verbal understanding of the material. Patient agrees to go over the instructions while at home for a better understanding. Patient also instructed to self quarantine after being tested for COVID-19. The opportunity to ask questions was provided. ? ? ?

## 2021-08-31 ENCOUNTER — Encounter (HOSPITAL_COMMUNITY): Payer: Self-pay

## 2021-08-31 NOTE — Progress Notes (Addendum)
Anesthesia Chart Review: ? Case: [id number] Date/Time: 09/01/21 0745  ? Procedure: VIDEO BRONCHOSCOPY WITH ENDOBRONCHIAL NAVIGATION  ? Anesthesia type: General  ? Pre-op diagnosis: LUL NODULE  ? Location: [facility] ROOM 10 / [facility] OR  ? Surgeons: Melrose Nakayama, MD  ? ?  ? ? ?DISCUSSION: Patient is an 87 year old female scheduled for the above procedure. ? ?History includes smoking, COPD, HTN, CKD (stage IV), dyspnea, MGUS, anemia, goiter (diagnosed ~ age 72, reported negative biopsies x3 as of 02/2016), chronic hepatitis C, gallstone pancreatitis (s/p cholecystectomy 09/17/18), pyelonephritis with Viridans Strep bacteremia (03/2014), C. Difficile colitis (05/2014) ? ?[facility] ED visit 05/26/21. She was sent there by her nephrologist due to hypotension, wheezing, concern for volume overload. CXR did not show any pleural effusion or airspace disease. Wheezing was treated with albuterol. BP 140/56. HR 51. O2 sats 98%. ED provider interpreted the EKD as slow afib--although there is some regularity to her EKG and known short PR on previous tracings. She was discharged home. Her nephrologist did order an echo (for renal failure) which showed normal LVEF without wall motion abnormalities, grade II diastolic dysfunction, trivial MR. She has LVH with inferolateral T wave inversion on EKGs dating back to at least 2014. ? ?Last oncology visit with Dr. Delton Coombes was on 08/09/21 for follow-up MGUS/IgG kappa smoldering myeloma. He notes, 07/14/21 bone marrow biopsy showed "hypocellular marrow with clonal plasma cell population comprising 15-20% of the total marrow cellularity.  No overt dysplasia". 06/21/21 skeletal survey showed "1 cm circumscribed sclerotic lesion in the distal tibial shaft on the right, nonspecific.  No lytic bone lesion." He recommended myeloma labs every 3 months with imaging years. He also recommended referral to CT surgery for PET positive LUL nodule for biopsy. ? ?Dr. Roxan Hockey classified her Zubrod score as 3: Only  limited self care, in bed greater than 50% of waking hours. She is not felt to be an operative candidate, so if biopsy is positive radiation therapy will be considered. Radiology favored a bronchoscopic approach for biopsy.  ? ?Chart forwarded to anesthesia APP for review after patient left her PAT visit, otherwise I would have repeated her EKG since I'm not completely convinced she was in new afib on 05/26/21 EKG as lead V2 seems to have small p-waves with short PR. There is a significant amount of artifact, particularly in limb leads, that make the underlying rhythm analysis challenging. Fortunately, she did have an echocardiogram shortly afterwards that did not show any major valvular abnormalities and LVEF was normal. LA was severely dilated though. ? ?Case was reviewed with anesthesiologist Nolon Nations, MD. EKG will be repeated on arrival for surgery, and anesthesia team will further evaluate. Definitive anesthesia plan at that time. 08/30/21 CXR is still in process. ? ?Of note, recent PET scan describes goiter as massive in size. She was successfully intubated on 09/17/18 using IV induction, MAC and 3, oral 7.0 mm ETT with one attempt, grade II view. ? ? ?VS: BP (!) 160/58   Pulse (!) 56   Temp 36.6 ?C (Oral)   Resp 18   Ht _0  (1.702 m)   Wt 47.8 kg   SpO2 99%   BMI 16.49 kg/m?  ? ?PROVIDERS: ?Carrolyn Meiers, MD is PCP  ?- Derek Jack, MD is HEM-ONC ?- Montez Morita, Quillian Quince, MD is GI. Last visit 08/22/21 for follow-up chronic hepatitis C genotype 1b. She was not felt to be a "suitable candiate" for direct-acting antiviral therapy. He added, "No further management will be  performed for her hepatitis C, unless she develops advanced liver fibrosis/cirrhosis, markedly elevated liver enzymes or extraintestinal manifestations of hepatitis C". ?- Ulice Bold, MBBS is nephrologist. Last visit 06/24/21.  ?- Curt Jews, MD is vascular surgeon. Last visit 10/11/20 for follow-up  asymptomatic left carotid stenosis.  Duplex then showed 60 to 79% left ICA stenosis, at the lower end of the range.  He recommended 1 year follow-up unless new changes.  ? ? ?LABS: Labs reviewed: Acceptable for surgery. Cr 1.67, previously 1.89-1.97 since 05/26/21 in [facility]. CBC results are similar to prior. PLT count 104K, down from 125K--known pancytopenia history.  ?(all labs ordered are listed, but only abnormal results are displayed) ? ?Labs Reviewed  ?COMPREHENSIVE METABOLIC PANEL - Abnormal; Notable for the following components:  ?    Result Value  ? BUN 40 (*)   ? Creatinine, Ser 1.67 (*)   ? Total Protein 8.5 (*)   ? Albumin 3.2 (*)   ? GFR, Estimated 30 (*)   ? All other components within normal limits  ?CBC - Abnormal; Notable for the following components:  ? WBC 3.0 (*)   ? RBC 3.48 (*)   ? Hemoglobin 10.8 (*)   ? HCT 33.8 (*)   ? Platelets 104 (*)   ? All other components within normal limits  ?SARS CORONAVIRUS 2 (TAT 6-24 HRS)  ?PROTIME-INR  ?APTT  ? ? ?PFTs 08/15/21: ?FVC 1.25 (56%), post 1.11 (50%). FEV1 0.49 (28%), post 0.44 (25%). DLCO unc 6.78 (33%). ?Post-Test Comments: ?The results of this test does not meets ATS standards; DLCO received error code IVC < 90% HHN given with 2.30m of ?Albuterol. Hgb default value of 13.4 used Patient gave best possible effort. Patient had a difficult time performing test. ?Pleeth porton of study was most difficult. ? ?The FVC, FEV1, FEV1/FVC ratio and FEF25-75% are reduced indicating airway obstruction. It is impossible to ?adequately evaluate the flow volume loop due to excessive variablity such as might be produced by coughing. The ?increased airway resistance and decreased specific conductance indicate a central airway disease. The TLC, RV, FRC and ?RV/TLC ratio are all increased indicating overinflation and air trapping. Following administration of bronchodilators, there ?is no significant response. The reduced diffusing capacity indicates a severe loss of  functional alveolar capillary surface. ?However, the diffusing capacity was not corrected for the patient's hemoglobin. Maldistribution of ventilation is indicated ?by the difference between the alveolar volume and the total lung capacity. ? ?Conclusions:  ?A reduced diffusing capacity, severe airway obstruction and overinflation are characteristic of emphysema. In ?view of the severity of the diffusion defect, studies with exercise would be helpful to evaluate the presence of hypoxemia. ?Pulmonary Function Diagnosis: ?Severe Obstructive Airways Disease -Emphysematous Type ?Insignificant response to bronchodilator ?Overinflation and airtrapping ?Diffusion severely reduced ? ? ?IMAGES: ?CXR 08/30/21: In process. ? ?CT Super D Chest 08/26/21: ?IMPRESSION: ?1. Unchanged, spiculated nodule of the posterior left apex measuring ?2.1 x 1.6 cm, previously hypermetabolic and highly concerning for ?primary lung malignancy. ?2. Emphysema and diffuse bilateral bronchial wall thickening. ?3. Coronary artery disease. ?4. Bulky thyroid goiter. ?- Aortic Atherosclerosis (ICD10-I70.0) and Emphysema (ICD10-J43.9). ? ?PET Scan 08/04/21: ?IMPRESSION: ?1. No evidence of active multiple myeloma on FDG PET scan. No lytic ?lesion on CT portion exam. No soft tissue plasmacytoma identified. ?2. Hypermetabolic nodule in the LEFT upper lobe highly concerning ?for BRONCHOGENIC CARCINOMA. No evidence of metastatic disease. ?Recommend tissue sampling. ?3. Massive thyroid goiter. ? ?[place] Liver 07/28/21: ?IMPRESSION: ?ULTRASOUND LIVER: ?Diffusely increased  hepatic parenchymal echogenicity with some ?coarsening of the echotexture, nonspecific most commonly reflecting ?hepatic steatosis and/or chronic hepatocellular disease. No overt ?hepatic lesion identified. ?  ?ULTRASOUND HEPATIC ELASTOGRAPHY: ?Median kPa:  5.3 ?Diagnostic category: < or = 9 kPa: in the absence of other known ?clinical signs, rules out cACLD ?  ? ?EKG: 05/26/21: ?Atrial  fibrillation with slow ventricular response ?Minimal voltage criteria for LVH, may be normal variant ( Sokolow-Lyon ) ?ST & Marked T-wave abnormality, consider inferolateral ischemia ?Abnormal ECG ?Confirmed by Berton Mount

## 2021-08-31 NOTE — Anesthesia Preprocedure Evaluation (Addendum)
Anesthesia Evaluation  ?Patient identified by MRN, date of birth, ID band ?Patient awake ? ? ? ?Reviewed: ?Allergy & Precautions, NPO status , Patient's Chart, lab work & pertinent test results ? ?Airway ?Mallampati: II ? ?TM Distance: >3 FB ?Neck ROM: Full ? ? ? Dental ? ?(+) Missing, Poor Dentition, Edentulous Upper, Dental Advisory Given ?  ?Pulmonary ?shortness of breath, COPD, Current Smoker and Patient abstained from smoking.,  ?  ? ?+ decreased breath sounds+ wheezing ? ? ? ? ? Cardiovascular ?hypertension, Pt. on medications ?Normal cardiovascular exam ?Rhythm:Regular Rate:Normal ? ?Echo 06/14/21: ?IMPRESSIONS  ??1. Left ventricular ejection fraction, by estimation, is 60 to 65%. The  ?left ventricle has normal function. The left ventricle has no regional  ?wall motion abnormalities. There is mild left ventricular hypertrophy.  ?Left ventricular diastolic parameters  ?are consistent with Grade II diastolic dysfunction (pseudonormalization).  ?Elevated left atrial pressure.  ??2. Right ventricular systolic function is normal. The right ventricular  ?size is normal. There is mildly elevated pulmonary artery systolic  ?pressure.  ??3. Left atrial size was severely dilated.  ??4. The mitral valve is abnormal. Trivial mitral valve regurgitation. No  ?evidence of mitral stenosis. Moderate mitral annular calcification.  ??5. The aortic valve is tricuspid. Aortic valve regurgitation is not  ?visualized. No aortic stenosis is present.  ??6. The inferior vena cava is dilated in size with >50% respiratory  ?variability, suggesting right atrial pressure of 8 mmHg.  ?? ?? ?  ?Neuro/Psych ?negative neurological ROS ?   ? GI/Hepatic ?negative GI ROS, (+) Hepatitis -  ?Endo/Other  ?negative endocrine ROS ? Renal/GU ?Renal disease  ? ?  ?Musculoskeletal ?negative musculoskeletal ROS ?(+)  ? Abdominal ?  ?Peds ? Hematology ? ?(+) Blood dyscrasia, anemia ,   ?Anesthesia Other Findings ? ?  Reproductive/Obstetrics ? ?  ? ? ? ? ? ? ? ? ? ? ? ? ? ?  ?  ? ? ? ? ? ? ?Anesthesia Physical ?Anesthesia Plan ? ?ASA: 3 ? ?Anesthesia Plan: General  ? ?Post-op Pain Management: Minimal or no pain anticipated  ? ?Induction: Intravenous ? ?PONV Risk Score and Plan: 2 and Ondansetron, Dexamethasone and Treatment may vary due to age or medical condition ? ?Airway Management Planned: Oral ETT ? ?Additional Equipment:  ? ?Intra-op Plan:  ? ?Post-operative Plan: Possible Post-op intubation/ventilation ? ?Informed Consent: I have reviewed the patients History and Physical, chart, labs and discussed the procedure including the risks, benefits and alternatives for the proposed anesthesia with the patient or authorized representative who has indicated his/her understanding and acceptance.  ? ? ? ?Dental advisory given ? ?Plan Discussed with: CRNA ? ?Anesthesia Plan Comments: (See PAT note written 08/31/2021 by Myra Gianotti, PA-C. She is for repeat EKG on arrival for update/confirm baseline rhythm. She does not see a cardiologist. ? ?Recent PET scan describes goiter as massive in size. She was successfully intubated on 09/17/18 using IV induction, MAC and 3, oral 7.0 mm ETT with one attempt, grade II view. ? ?)  ? ? ? ? ?Anesthesia Quick Evaluation ? ?

## 2021-09-01 ENCOUNTER — Encounter (HOSPITAL_COMMUNITY)
Admission: RE | Disposition: A | Discharge status: Home / Self Care | Payer: Self-pay | Source: Home / Self Care | Attending: Thoracic Surgery (Cardiothoracic Vascular Surgery)

## 2021-09-01 ENCOUNTER — Ambulatory Visit (HOSPITAL_COMMUNITY): Payer: Medicare Other

## 2021-09-01 ENCOUNTER — Inpatient Hospital Stay (HOSPITAL_COMMUNITY)
Admission: EM | Admit: 2021-09-01 | Discharge: 2021-09-06 | DRG: 264 | Disposition: A | Discharge status: Home Health | Payer: Medicare Other | Attending: Internal Medicine | Admitting: Internal Medicine

## 2021-09-01 ENCOUNTER — Ambulatory Visit (HOSPITAL_BASED_OUTPATIENT_CLINIC_OR_DEPARTMENT_OTHER): Payer: Medicare Other | Admitting: Vascular Surgery

## 2021-09-01 ENCOUNTER — Encounter (HOSPITAL_COMMUNITY): Payer: Self-pay | Admitting: Emergency Medicine

## 2021-09-01 ENCOUNTER — Other Ambulatory Visit: Payer: Self-pay

## 2021-09-01 ENCOUNTER — Ambulatory Visit (HOSPITAL_COMMUNITY): Payer: Medicare Other | Admitting: Vascular Surgery

## 2021-09-01 ENCOUNTER — Emergency Department (HOSPITAL_COMMUNITY): Payer: Medicare Other

## 2021-09-01 ENCOUNTER — Encounter (HOSPITAL_COMMUNITY): Payer: Self-pay | Admitting: Thoracic Surgery (Cardiothoracic Vascular Surgery)

## 2021-09-01 ENCOUNTER — Ambulatory Visit (HOSPITAL_COMMUNITY)
Admission: RE | Admit: 2021-09-01 | Discharge: 2021-09-01 | Disposition: A | Discharge status: Home / Self Care | Payer: Medicare Other | Source: Home / Self Care | Attending: Thoracic Surgery (Cardiothoracic Vascular Surgery) | Admitting: Thoracic Surgery (Cardiothoracic Vascular Surgery)

## 2021-09-01 DIAGNOSIS — J449 Chronic obstructive pulmonary disease, unspecified: Secondary | ICD-10-CM

## 2021-09-01 DIAGNOSIS — D631 Anemia in chronic kidney disease: Secondary | ICD-10-CM | POA: Diagnosis present

## 2021-09-01 DIAGNOSIS — Z8579 Personal history of other malignant neoplasms of lymphoid, hematopoietic and related tissues: Secondary | ICD-10-CM

## 2021-09-01 DIAGNOSIS — Z8719 Personal history of other diseases of the digestive system: Secondary | ICD-10-CM | POA: Insufficient documentation

## 2021-09-01 DIAGNOSIS — I129 Hypertensive chronic kidney disease with stage 1 through stage 4 chronic kidney disease, or unspecified chronic kidney disease: Secondary | ICD-10-CM | POA: Insufficient documentation

## 2021-09-01 DIAGNOSIS — Z8619 Personal history of other infectious and parasitic diseases: Secondary | ICD-10-CM | POA: Insufficient documentation

## 2021-09-01 DIAGNOSIS — D630 Anemia in neoplastic disease: Secondary | ICD-10-CM | POA: Diagnosis not present

## 2021-09-01 DIAGNOSIS — E049 Nontoxic goiter, unspecified: Secondary | ICD-10-CM | POA: Insufficient documentation

## 2021-09-01 DIAGNOSIS — E042 Nontoxic multinodular goiter: Secondary | ICD-10-CM | POA: Diagnosis present

## 2021-09-01 DIAGNOSIS — D759 Disease of blood and blood-forming organs, unspecified: Secondary | ICD-10-CM | POA: Insufficient documentation

## 2021-09-01 DIAGNOSIS — N184 Chronic kidney disease, stage 4 (severe): Secondary | ICD-10-CM | POA: Insufficient documentation

## 2021-09-01 DIAGNOSIS — Z79899 Other long term (current) drug therapy: Secondary | ICD-10-CM

## 2021-09-01 DIAGNOSIS — R911 Solitary pulmonary nodule: Secondary | ICD-10-CM

## 2021-09-01 DIAGNOSIS — I214 Non-ST elevation (NSTEMI) myocardial infarction: Secondary | ICD-10-CM | POA: Diagnosis not present

## 2021-09-01 DIAGNOSIS — I209 Angina pectoris, unspecified: Secondary | ICD-10-CM | POA: Diagnosis not present

## 2021-09-01 DIAGNOSIS — Z720 Tobacco use: Secondary | ICD-10-CM

## 2021-09-01 DIAGNOSIS — C3412 Malignant neoplasm of upper lobe, left bronchus or lung: Secondary | ICD-10-CM | POA: Insufficient documentation

## 2021-09-01 DIAGNOSIS — I160 Hypertensive urgency: Principal | ICD-10-CM | POA: Diagnosis present

## 2021-09-01 DIAGNOSIS — F17210 Nicotine dependence, cigarettes, uncomplicated: Secondary | ICD-10-CM | POA: Insufficient documentation

## 2021-09-01 DIAGNOSIS — R5381 Other malaise: Secondary | ICD-10-CM | POA: Diagnosis present

## 2021-09-01 DIAGNOSIS — D649 Anemia, unspecified: Secondary | ICD-10-CM | POA: Insufficient documentation

## 2021-09-01 DIAGNOSIS — I10 Essential (primary) hypertension: Secondary | ICD-10-CM

## 2021-09-01 DIAGNOSIS — R918 Other nonspecific abnormal finding of lung field: Secondary | ICD-10-CM | POA: Diagnosis not present

## 2021-09-01 DIAGNOSIS — C9000 Multiple myeloma not having achieved remission: Secondary | ICD-10-CM

## 2021-09-01 DIAGNOSIS — I471 Supraventricular tachycardia: Secondary | ICD-10-CM | POA: Diagnosis not present

## 2021-09-01 DIAGNOSIS — R079 Chest pain, unspecified: Secondary | ICD-10-CM

## 2021-09-01 DIAGNOSIS — R1310 Dysphagia, unspecified: Secondary | ICD-10-CM

## 2021-09-01 DIAGNOSIS — Z20822 Contact with and (suspected) exposure to covid-19: Secondary | ICD-10-CM | POA: Diagnosis present

## 2021-09-01 DIAGNOSIS — Z8349 Family history of other endocrine, nutritional and metabolic diseases: Secondary | ICD-10-CM

## 2021-09-01 DIAGNOSIS — B182 Chronic viral hepatitis C: Secondary | ICD-10-CM | POA: Diagnosis present

## 2021-09-01 DIAGNOSIS — Z7982 Long term (current) use of aspirin: Secondary | ICD-10-CM

## 2021-09-01 DIAGNOSIS — Z8249 Family history of ischemic heart disease and other diseases of the circulatory system: Secondary | ICD-10-CM

## 2021-09-01 DIAGNOSIS — K224 Dyskinesia of esophagus: Secondary | ICD-10-CM | POA: Diagnosis present

## 2021-09-01 DIAGNOSIS — Z8639 Personal history of other endocrine, nutritional and metabolic disease: Secondary | ICD-10-CM | POA: Insufficient documentation

## 2021-09-01 HISTORY — PX: VIDEO BRONCHOSCOPY WITH ENDOBRONCHIAL NAVIGATION: SHX6175

## 2021-09-01 HISTORY — PX: FUDUCIAL PLACEMENT: SHX5083

## 2021-09-01 LAB — CBC
HCT: 33.6 % — ABNORMAL LOW (ref 36.0–46.0)
Hemoglobin: 10.6 g/dL — ABNORMAL LOW (ref 12.0–15.0)
MCH: 31.2 pg (ref 26.0–34.0)
MCHC: 31.5 g/dL (ref 30.0–36.0)
MCV: 98.8 fL (ref 80.0–100.0)
Platelets: 94 10*3/uL — ABNORMAL LOW (ref 150–400)
RBC: 3.4 MIL/uL — ABNORMAL LOW (ref 3.87–5.11)
RDW: 12.9 % (ref 11.5–15.5)
WBC: 3.7 10*3/uL — ABNORMAL LOW (ref 4.0–10.5)
nRBC: 0 % (ref 0.0–0.2)

## 2021-09-01 LAB — BASIC METABOLIC PANEL
Anion gap: 10 (ref 5–15)
BUN: 43 mg/dL — ABNORMAL HIGH (ref 8–23)
CO2: 25 mmol/L (ref 22–32)
Calcium: 9 mg/dL (ref 8.9–10.3)
Chloride: 104 mmol/L (ref 98–111)
Creatinine, Ser: 1.95 mg/dL — ABNORMAL HIGH (ref 0.44–1.00)
GFR, Estimated: 25 mL/min — ABNORMAL LOW (ref >60)
Glucose, Bld: 164 mg/dL — ABNORMAL HIGH (ref 70–99)
Potassium: 4.3 mmol/L (ref 3.5–5.1)
Sodium: 139 mmol/L (ref 135–145)

## 2021-09-01 LAB — SURGICAL PCR SCREEN
MRSA, PCR: NEGATIVE
Staphylococcus aureus: NEGATIVE

## 2021-09-01 LAB — TROPONIN I (HIGH SENSITIVITY): Troponin I (High Sensitivity): 18 ng/L — ABNORMAL HIGH (ref <18)

## 2021-09-01 SURGERY — VIDEO BRONCHOSCOPY WITH ENDOBRONCHIAL NAVIGATION
Anesthesia: General | Site: Chest

## 2021-09-01 MED ORDER — LIDOCAINE 2% (20 MG/ML) 5 ML SYRINGE
INTRAMUSCULAR | Status: DC | PRN
  Administered 2021-09-01: 60 mg via INTRAVENOUS

## 2021-09-01 MED ORDER — PHENYLEPHRINE 40 MCG/ML (10ML) SYRINGE FOR IV PUSH (FOR BLOOD PRESSURE SUPPORT)
PREFILLED_SYRINGE | INTRAVENOUS | Status: DC | PRN
  Administered 2021-09-01: 40 ug via INTRAVENOUS

## 2021-09-01 MED ORDER — GLYCOPYRROLATE PF 0.2 MG/ML IJ SOSY
PREFILLED_SYRINGE | INTRAMUSCULAR | Status: DC | PRN
  Administered 2021-09-01: .2 mg via INTRAVENOUS

## 2021-09-01 MED ORDER — LACTATED RINGERS IV SOLN: INTRAVENOUS | Status: DC

## 2021-09-01 MED ORDER — EPINEPHRINE PF 1 MG/ML IJ SOLN
INTRAMUSCULAR | Status: AC
  Filled 2021-09-01: qty 1

## 2021-09-01 MED ORDER — PROPOFOL 10 MG/ML IV BOLUS
INTRAVENOUS | Status: AC
  Filled 2021-09-01: qty 20

## 2021-09-01 MED ORDER — ROCURONIUM BROMIDE 10 MG/ML (PF) SYRINGE
PREFILLED_SYRINGE | INTRAVENOUS | Status: DC | PRN
  Administered 2021-09-01: 20 mg via INTRAVENOUS
  Administered 2021-09-01: 30 mg via INTRAVENOUS
  Administered 2021-09-01: 20 mg via INTRAVENOUS

## 2021-09-01 MED ORDER — PHENYLEPHRINE HCL-NACL 20-0.9 MG/250ML-% IV SOLN
INTRAVENOUS | Status: DC | PRN
  Administered 2021-09-01: 40 ug/min via INTRAVENOUS
  Administered 2021-09-01: 60 ug/min via INTRAVENOUS

## 2021-09-01 MED ORDER — CHLORHEXIDINE GLUCONATE 0.12 % MT SOLN
OROMUCOSAL | Status: AC
  Administered 2021-09-01: 15 mL via OROMUCOSAL
  Filled 2021-09-01: qty 15

## 2021-09-01 MED ORDER — 0.9 % SODIUM CHLORIDE (POUR BTL) OPTIME
TOPICAL | Status: DC | PRN
Start: 2021-09-01 — End: 2021-09-01
  Administered 2021-09-01: 1000 mL

## 2021-09-01 MED ORDER — SUGAMMADEX SODIUM 200 MG/2ML IV SOLN
INTRAVENOUS | Status: DC | PRN
  Administered 2021-09-01: 200 mg via INTRAVENOUS

## 2021-09-01 MED ORDER — DEXAMETHASONE SODIUM PHOSPHATE 10 MG/ML IJ SOLN
INTRAMUSCULAR | Status: AC
  Filled 2021-09-01: qty 1

## 2021-09-01 MED ORDER — PHENYLEPHRINE 40 MCG/ML (10ML) SYRINGE FOR IV PUSH (FOR BLOOD PRESSURE SUPPORT)
PREFILLED_SYRINGE | INTRAVENOUS | Status: AC
  Filled 2021-09-01: qty 10

## 2021-09-01 MED ORDER — FENTANYL CITRATE (PF) 100 MCG/2ML IJ SOLN: 25.0000 ug | INTRAMUSCULAR | Status: DC | PRN

## 2021-09-01 MED ORDER — ALBUTEROL SULFATE (2.5 MG/3ML) 0.083% IN NEBU
INHALATION_SOLUTION | RESPIRATORY_TRACT | Status: AC
  Administered 2021-09-01: 2.5 mg via RESPIRATORY_TRACT
  Filled 2021-09-01: qty 3

## 2021-09-01 MED ORDER — ONDANSETRON HCL 4 MG/2ML IJ SOLN
INTRAMUSCULAR | Status: AC
  Filled 2021-09-01: qty 2

## 2021-09-01 MED ORDER — ROCURONIUM BROMIDE 10 MG/ML (PF) SYRINGE
PREFILLED_SYRINGE | INTRAVENOUS | Status: AC
  Filled 2021-09-01: qty 10

## 2021-09-01 MED ORDER — ALBUTEROL SULFATE (2.5 MG/3ML) 0.083% IN NEBU
2.5000 mg | INHALATION_SOLUTION | Freq: Once | RESPIRATORY_TRACT | Status: AC
  Filled 2021-09-01: qty 3

## 2021-09-01 MED ORDER — EPHEDRINE 5 MG/ML INJ
INTRAVENOUS | Status: AC
  Filled 2021-09-01: qty 5

## 2021-09-01 MED ORDER — FENTANYL CITRATE (PF) 250 MCG/5ML IJ SOLN
INTRAMUSCULAR | Status: AC
  Filled 2021-09-01: qty 5

## 2021-09-01 MED ORDER — GLYCOPYRROLATE PF 0.2 MG/ML IJ SOSY
PREFILLED_SYRINGE | INTRAMUSCULAR | Status: AC
  Filled 2021-09-01: qty 1

## 2021-09-01 MED ORDER — DEXAMETHASONE SODIUM PHOSPHATE 10 MG/ML IJ SOLN
INTRAMUSCULAR | Status: DC | PRN
  Administered 2021-09-01: 10 mg via INTRAVENOUS

## 2021-09-01 MED ORDER — EPHEDRINE SULFATE-NACL 50-0.9 MG/10ML-% IV SOSY
PREFILLED_SYRINGE | INTRAVENOUS | Status: DC | PRN
  Administered 2021-09-01 (×2): 10 mg via INTRAVENOUS
  Administered 2021-09-01: 5 mg via INTRAVENOUS

## 2021-09-01 MED ORDER — NITROGLYCERIN 0.4 MG SL SUBL
0.4000 mg | SUBLINGUAL_TABLET | SUBLINGUAL | Status: AC | PRN
  Administered 2021-09-01 – 2021-09-02 (×3): 0.4 mg via SUBLINGUAL
  Filled 2021-09-01 (×2): qty 1

## 2021-09-01 MED ORDER — FENTANYL CITRATE (PF) 250 MCG/5ML IJ SOLN
INTRAMUSCULAR | Status: DC | PRN
  Administered 2021-09-01: 50 ug via INTRAVENOUS

## 2021-09-01 MED ORDER — ORAL CARE MOUTH RINSE: 15.0000 mL | Freq: Once | OROMUCOSAL | Status: AC

## 2021-09-01 MED ORDER — ACETAMINOPHEN 325 MG PO TABS: 650.0000 mg | ORAL_TABLET | Freq: Four times a day (QID) | ORAL | Status: DC | PRN

## 2021-09-01 MED ORDER — DROPERIDOL 2.5 MG/ML IJ SOLN: 0.6250 mg | Freq: Once | INTRAMUSCULAR | Status: DC | PRN

## 2021-09-01 MED ORDER — PROPOFOL 10 MG/ML IV BOLUS
INTRAVENOUS | Status: DC | PRN
  Administered 2021-09-01: 60 mg via INTRAVENOUS

## 2021-09-01 MED ORDER — LIDOCAINE 2% (20 MG/ML) 5 ML SYRINGE
INTRAMUSCULAR | Status: AC
  Filled 2021-09-01: qty 5

## 2021-09-01 MED ORDER — CHLORHEXIDINE GLUCONATE 0.12 % MT SOLN: 15.0000 mL | Freq: Once | OROMUCOSAL | Status: AC

## 2021-09-01 MED ORDER — MORPHINE SULFATE (PF) 4 MG/ML IV SOLN
4.0000 mg | Freq: Once | INTRAVENOUS | Status: AC
  Administered 2021-09-01: 4 mg via INTRAVENOUS
  Filled 2021-09-01: qty 1

## 2021-09-01 SURGICAL SUPPLY — 43 items
ADAPTER BRONCHOSCOPE OLYMPUS (ADAPTER) ×4 IMPLANT
ADAPTER VALVE BIOPSY EBUS (MISCELLANEOUS) IMPLANT
ADPR BSCP OLMPS EDG (ADAPTER) ×2
ADPTR VALVE BIOPSY EBUS (MISCELLANEOUS)
BRUSH BIOPSY BRONCH 10 SDTNB (MISCELLANEOUS) ×1 IMPLANT
BRUSH SUPERTRAX BIOPSY (INSTRUMENTS) ×1 IMPLANT
BRUSH SUPERTRAX NDL-TIP CYTO (INSTRUMENTS) ×5 IMPLANT
CANISTER SUCT 3000ML PPV (MISCELLANEOUS) ×4 IMPLANT
CNTNR URN SCR LID CUP LEK RST (MISCELLANEOUS) ×6 IMPLANT
CONT SPEC 4OZ STRL OR WHT (MISCELLANEOUS) ×6
COVER BACK TABLE 60X90IN (DRAPES) ×4 IMPLANT
FILTER STRAW FLUID ASPIR (MISCELLANEOUS) IMPLANT
FORCEPS BIOP SUPERTRX PREMAR (INSTRUMENTS) ×1 IMPLANT
GAUZE SPONGE 4X4 12PLY STRL (GAUZE/BANDAGES/DRESSINGS) ×4 IMPLANT
GLOVE SURG SIGNA 7.5 PF LTX (GLOVE) ×4 IMPLANT
GOWN STRL REUS W/ TWL XL LVL3 (GOWN DISPOSABLE) ×3 IMPLANT
GOWN STRL REUS W/TWL XL LVL3 (GOWN DISPOSABLE) ×3
KIT CLEAN ENDO COMPLIANCE (KITS) ×4 IMPLANT
KIT ILLUMISITE 180 PROCEDURE (KITS) ×1 IMPLANT
KIT MARKER FIDUCIAL DELIVERY (KITS) ×2 IMPLANT
KIT TURNOVER KIT B (KITS) ×4 IMPLANT
MARKER FIDUCIAL SL NIT COIL (Implant Marker) ×4 IMPLANT
MARKER SKIN DUAL TIP RULER LAB (MISCELLANEOUS) ×4 IMPLANT
NDL SUPERTRX PREMARK BIOPSY (NEEDLE) IMPLANT
NEEDLE SUPERTRX PREMARK BIOPSY (NEEDLE) ×6 IMPLANT
NS IRRIG 1000ML POUR BTL (IV SOLUTION) ×4 IMPLANT
OIL SILICONE PENTAX (PARTS (SERVICE/REPAIRS)) ×4 IMPLANT
PAD ARMBOARD 7.5X6 YLW CONV (MISCELLANEOUS) ×8 IMPLANT
PATCHES PATIENT (LABEL) ×12 IMPLANT
SYR 20ML ECCENTRIC (SYRINGE) ×4 IMPLANT
SYR 20ML LL LF (SYRINGE) ×4 IMPLANT
SYR 30ML LL (SYRINGE) ×4 IMPLANT
SYR 5ML LL (SYRINGE) ×4 IMPLANT
SYR TB 1ML LUER SLIP (SYRINGE) IMPLANT
TOWEL GREEN STERILE (TOWEL DISPOSABLE) ×4 IMPLANT
TOWEL GREEN STERILE FF (TOWEL DISPOSABLE) ×4 IMPLANT
TRAP SPECIMEN MUCUS 40CC (MISCELLANEOUS) ×5 IMPLANT
TUBE CONNECTING 20X1/4 (TUBING) ×8 IMPLANT
UNDERPAD 30X36 HEAVY ABSORB (UNDERPADS AND DIAPERS) ×4 IMPLANT
VALVE BIOPSY  SINGLE USE (MISCELLANEOUS) ×3
VALVE BIOPSY SINGLE USE (MISCELLANEOUS) ×3 IMPLANT
VALVE SUCTION BRONCHIO DISP (MISCELLANEOUS) ×4 IMPLANT
WATER STERILE IRR 1000ML POUR (IV SOLUTION) ×4 IMPLANT

## 2021-09-01 NOTE — Discharge Instructions (Addendum)
Do not drive or engage in heavy physical activity for 24 hours. ? ?You may resume normal activities tomorrow. ? ?You may cough up small amounts of blood over the next few days ? ?You may use acetaminophen (Tylenol) if needed for discomfort. You may use an over the counter cough medication or throat lozenges if needed. ? ?Call [phone] if you develop chest pain, shortness of breath, fever > 101 F or cough up more than a tablespoon of blood. ? ?Follow up as scheduled with Dr. Delton Coombes. ?

## 2021-09-01 NOTE — Transfer of Care (Signed)
Immediate Anesthesia Transfer of Care Note ? ?Patient: Lisa [name] ? ?Procedure(s) Performed: VIDEO BRONCHOSCOPY WITH ENDOBRONCHIAL NAVIGATION (Chest) ?PLACEMENT OF FIDUCIAL MARKERS TIMES FOUR (Left: Chest) ? ?Patient Location: PACU ? ?Anesthesia Type:General ? ?Level of Consciousness: drowsy ? ?Airway & Oxygen Therapy: Patient Spontanous Breathing and Patient connected to nasal cannula oxygen ? ?Post-op Assessment: Report given to RN and Post -op Vital signs reviewed and stable ? ?Post vital signs: Reviewed and stable ? ?Last Vitals:  ?Vitals Value Taken Time  ?BP 131/48 09/01/21 1019  ?Temp    ?Pulse 52 09/01/21 1021  ?Resp 18 09/01/21 1021  ?SpO2 100 % 09/01/21 1021  ?Vitals shown include unvalidated device data. ? ?Last Pain:  ?Vitals:  ? 09/01/21 0621  ?TempSrc: Oral  ?   ? ?  ? ?Complications: No notable events documented. ?

## 2021-09-01 NOTE — ED Provider Notes (Addendum)
?[facility] ?Provider Note ? ? ?CSN: [id number] ?Arrival date & time: 09/01/21  2009 ? ?  ? ?History ? ?Chief Complaint  ?Patient presents with  ? Shortness of Breath  ? ? ?Lisa [name] is a 87 y.o. female. ? ?HPI ?87 year old female presents with chest pain.  Started this afternoon around 3 PM.  She had a lung biopsy earlier today and was not allowed to eat or drink since yesterday.  She ate food around 2 PM and then an hour later got this chest pain.  She wonders if it is indigestion.  She feels like she needs to burp.  She states it kind of feels like when she had her gallbladder taken out.  No abdominal pain.  Some shortness of breath.  Symptoms have waxed and waned ever since and she has not taken anything for the pain.  No cough or fever pain is not nearly as bad as it was earlier and is about a 4 out of 10.  She starting to develop a little bit of pain in her back but no pain that goes straight through to her back. ? ?Home Medications ?Prior to Admission medications   ?Medication Sig Start Date End Date Taking? Authorizing Provider  ?aspirin 81 MG EC tablet Take 81 mg by mouth in the morning.    [provider]  ?calcium carbonate (OS-CAL - DOSED IN MG OF ELEMENTAL CALCIUM) 1250 (500 Ca) MG tablet Take 1 tablet by mouth daily with breakfast.    [provider]  ?Cholecalciferol (VITAMIN D3) 125 MCG (5000 UT) CAPS Take 5,000 Units by mouth daily.    [provider]  ?diltiazem (CARDIZEM CD) 120 MG 24 hr capsule Take 120 mg by mouth in the morning. 04/12/21   [provider]  ?feeding supplement, ENSURE COMPLETE, (ENSURE COMPLETE) LIQD Take 237 mLs by mouth 2 (two) times daily between meals. 04/15/14   Cristal Ford, DO  ?furosemide (LASIX) 20 MG tablet Take 20 mg by mouth 2 (two) times daily. Morning & afternoon 04/25/21   [provider]  ?Ipratropium-Albuterol (COMBIVENT) 20-100 MCG/ACT AERS respimat Inhale 1 puff into the lungs every 6  (six) hours as needed for wheezing or shortness of breath.     [provider]  ?ipratropium-albuterol (DUONEB) 0.5-2.5 (3) MG/3ML SOLN Take 3 mLs by nebulization every 4 (four) hours as needed (wheezing/shortness of breath).    [provider]  ?labetalol (NORMODYNE) 300 MG tablet Take 300 mg by mouth in the morning.    [provider]  ?vitamin C (ASCORBIC ACID) 500 MG tablet Take 500 mg by mouth daily.    [provider]  ?   ? ?Allergies    ?Patient has no known allergies.   ? ?Review of Systems   ?Review of Systems  ?Constitutional:  Negative for fever.  ?Respiratory:  Positive for shortness of breath.   ?Cardiovascular:  Positive for chest pain and leg swelling (chronic).  ?Gastrointestinal:  Positive for abdominal pain.  ? ?Physical Exam ?Updated Vital Signs ?BP (!) 164/74   Pulse 79   Temp 98 ?F (36.7 ?C) (Oral)   Resp 16   Ht 5\' 7"  (1.702 m)   Wt 48.5 kg   SpO2 95%   BMI 16.76 kg/m?  ?Physical Exam ?Vitals and nursing note reviewed.  ?Constitutional:   ?   General: She is not in acute distress. ?   Appearance: She is well-developed. She is not ill-appearing or diaphoretic.  ?HENT:  ?  Head: Normocephalic and atraumatic.  ?Cardiovascular:  ?   Rate and Rhythm: Normal rate and regular rhythm.  ?   Heart sounds: Normal heart sounds.  ?Pulmonary:  ?   Effort: Pulmonary effort is normal.  ?   Breath sounds: Wheezing (slight, expiratory) present.  ?Chest:  ?   Chest wall: No tenderness.  ?Abdominal:  ?   Palpations: Abdomen is soft.  ?   Tenderness: There is no abdominal tenderness.  ?Skin: ?   General: Skin is warm and dry.  ?Neurological:  ?   Mental Status: She is alert.  ? ? ?ED Results / Procedures / Treatments   ?Labs ?(all labs ordered are listed, but only abnormal results are displayed) ?Labs Reviewed  ?CBC - Abnormal; Notable for the following components:  ?    Result Value  ? WBC 3.7 (*)   ? RBC 3.40 (*)   ? Hemoglobin 10.6 (*)   ? HCT 33.6 (*)   ? Platelets  94 (*)   ? All other components within normal limits  ?BASIC METABOLIC PANEL - Abnormal; Notable for the following components:  ? Glucose, Bld 164 (*)   ? BUN 43 (*)   ? Creatinine, Ser 1.95 (*)   ? GFR, Estimated 25 (*)   ? All other components within normal limits  ?TROPONIN I (HIGH SENSITIVITY) - Abnormal; Notable for the following components:  ? Troponin I (High Sensitivity) 18 (*)   ? All other components within normal limits  ?TROPONIN I (HIGH SENSITIVITY)  ? ? ?EKG ?EKG Interpretation ? ?Date/Time:  Thursday September 01 2021 21:45:47 EDT ?Ventricular Rate:  81 ?PR Interval:  121 ?QRS Duration: 94 ?QT Interval:  405 ?QTC Calculation: 471 ?R Axis:   77 ?Text Interpretation: Sinus rhythm Probable LVH with secondary repol abnrm ST depression, consider ischemia, diffuse lds Minimal ST elevation, lateral leads similar to earlier in the day and 2022 Confirmed by Sherwood Gambler [phone]) on 09/01/2021 9:58:06 PM ? ?Radiology ?DG Chest 2 View ? ?Result Date: 09/01/2021 ?CLINICAL DATA:  Mid chest pain and shortness of breath, left upper lobe lung biopsy earlier today EXAM: CHEST - 2 VIEW COMPARISON:  09/01/2021 at 10:40 a.m. FINDINGS: Frontal and lateral views of the chest demonstrate a stable cardiac silhouette. Fiduciary markers are again noted at the site of a known left upper lobe mass. No new airspace disease, effusion, or pneumothorax. Calcified thyroid goiter again noted. No acute bony abnormalities. IMPRESSION: 1. Postsurgical changes from left upper lobe mass biopsy, with fiduciary markers in place. No evidence of complication. Electronically Signed   By: Randa Ngo M.D.   On: 09/01/2021 21:12  ? ?DG Chest Port 1 View ? ?Result Date: 09/01/2021 ?CLINICAL DATA:  Status post bronchoscopy EXAM: PORTABLE CHEST - 1 VIEW COMPARISON:  08/30/2021 FINDINGS: Cardiomediastinal silhouette and pulmonary vasculature are within normal limits. Left upper lobe spiculated mass again seen. Multiple metallic markers now noted in the  region of this mass. Known upper mediastinal mass better seen on recent CT. No pneumothorax. IMPRESSION: No pneumothorax.  Left upper lobe mass again seen. Electronically Signed   By: Miachel Roux M.D.   On: 09/01/2021 10:49  ? ?DG C-ARM BRONCHOSCOPY ? ?Result Date: 09/01/2021 ?C-ARM BRONCHOSCOPY: Fluoroscopy was utilized by the requesting physician.  No radiographic interpretation.   ? ?Procedures ?Procedures  ? ? ?Medications Ordered in ED ?Medications  ?nitroGLYCERIN (NITROSTAT) SL tablet 0.4 mg (0.4 mg Sublingual Given 09/01/21 2300)  ?morphine (PF) 4 MG/ML injection 4 mg (4 mg Intravenous  Given 09/01/21 2147)  ? ? ?ED Course/ Medical Decision Making/ A&P ?  ?                        ?Medical Decision Making ?Amount and/or Complexity of Data Reviewed ?Labs: ordered. ?Radiology: ordered. ? ?Risk ?Prescription drug management. ? ? ?Patient's chest x-ray images viewed/interpreted by myself and she has the left upper lobe mass but no concerning findings such as a pneumothorax.  No mediastinal widening.  ECG is chronically abnormal and consistent with LVH.  Labs reviewed/interpreted and her creatinine is stable from baseline and troponin is initially 18.  I think this is likely due to chronic kidney disease.  She was pretty hypertensive on arrival and I was given her dose of IV morphine which did not do much so I gave her a dose of nitro.  She states she both feels better but also was able to burp and thus feels better.  While this could be cardiac I think is probably still more likely GI.  Offered admission but she and brother decline and she wants to go home if her second troponin is flat or not worsening.  Care transferred to Dr. Dayna Barker.  I think PE or dissection is a lot less likely in this scenario.  I do not think work-up is needed based on low concern for these etiologies based on presentation. ? ? ? ? ? ? ? ?Final Clinical Impression(s) / ED Diagnoses ?Final diagnoses:  ?None  ? ? ?Rx / DC Orders ?ED Discharge  Orders   ? ? None  ? ?  ? ? ?  ?Sherwood Gambler, MD ?09/02/21 [phone] ? ?  ?Sherwood Gambler, MD ?09/02/21 [phone] ? ?

## 2021-09-01 NOTE — Anesthesia Procedure Notes (Signed)
Procedure Name: Intubation ?Date/Time: 09/01/2021 8:56 AM ?Performed by: Leonor Liv, CRNA ?Pre-anesthesia Checklist: Patient identified, Emergency Drugs available, Suction available and Patient being monitored ?Patient Re-evaluated:Patient Re-evaluated prior to induction ?Oxygen Delivery Method: Circle System Utilized ?Preoxygenation: Pre-oxygenation with 100% oxygen ?Induction Type: IV induction ?Ventilation: Mask ventilation without difficulty ?Laryngoscope Size: Mac and 3 ?Grade View: Grade I ?Tube type: Oral ?Tube size: 8.5 mm ?Number of attempts: 1 ?Airway Equipment and Method: Stylet and Oral airway ?Placement Confirmation: ETT inserted through vocal cords under direct vision, positive ETCO2 and breath sounds checked- equal and bilateral ?Secured at: 20 cm ?Tube secured with: Tape ?Dental Injury: Teeth and Oropharynx as per pre-operative assessment  ?Comments: Intubation performed by Gearlean Alf paramedic student under direct supervision by CRNA and anesthesiologist ? ? ? ? ?

## 2021-09-01 NOTE — Op Note (Signed)
NAME: Lisa [name], Lisa [name] ?MEDICAL RECORD NO: [id number] ?ACCOUNT NO: [id number] ?DATE OF BIRTH: September 13, 1934 ?FACILITY: [facility] ?LOCATION: [facility] ?PHYSICIAN: Revonda Standard. Roxan Hockey, MD ? ?Operative Report  ? ?DATE OF PROCEDURE: 09/01/2021 ? ?PREOPERATIVE DIAGNOSIS:  Left upper lobe lung nodule. ? ?POSTOPERATIVE DIAGNOSIS:  Non-small cell carcinoma, left upper lobe, clinical stage IA. ? ?PROCEDURE:  Electromagnetic navigational bronchoscopy with brushings, needle aspirations, transbronchial biopsies and fiducial placement. ? ?SURGEON:  Revonda Standard. Roxan Hockey, MD ? ?ASSISTANT:  None. ? ?ANESTHESIA:  General. ? ?FINDINGS:  Quick prep on brushings showed non-small cell carcinoma. ? ?CLINICAL NOTE:  [name] is an 87 year old woman with a long history of tobacco abuse.  She recently was found to have a left upper lobe lung nodule that was markedly hypermetabolic on PET/CT. She is not a surgical candidate, but is potentially a  ?candidate for stereotactic radiation.  She was advised to undergo navigational bronchoscopy for diagnosis and staging and fiducial placement.  The indications, risks, benefits, and alternatives were discussed in detail with the patient.  She understood  ?and accepted the risks and agreed to proceed. ? ?DESCRIPTION OF PROCEDURE:  [name] was brought to the operating room on 09/01/2021.  Anesthesia was induced and she was intubated.  Sequential compression devices were placed on the calves for DVT prophylaxis.  A Bair Hugger was placed for active  ?warming.  Planning for the navigational bronchoscopy was performed as she was being prepared for the procedure. ? ?A timeout was performed.  Flexible fiberoptic bronchoscopy was performed via the endotracheal tube. It revealed normal endobronchial anatomy with no endobronchial lesions to the level of the subsegmental bronchi.  There were extensive clear secretions  ?bilaterally.  Locatable guide for navigation was placed.  After some initial  technical difficulties, registration was performed and there was good correlation of the video and virtual bronchoscopy.  The bronchoscope then was directed to the left upper  ?lobe bronchial origin and the appropriate subsegmental bronchus was cannulated.  This took quite some time as it was a difficult angle with 2 turns in short succession, but ultimately the catheter was advanced with good proximity to the nodule. Initial  ?sampling was performed on 3 needle brushings followed by 3 needle aspirations. While these were being evaluated, the catheter was manipulated and came into a better proximity and better alignment with the nodule and the sampling process was repeated.  ?Multiple transbronchial biopsies then were obtained as well.  The brushings and needle aspirations were both deemed adequate for diagnosis.  After obtaining numerous biopsies, fiducial placement was performed. Because of the difficulty in reaching the  ?airway, initially a fiducial was placed as close as possible to the center of the lesion.  The locatable guide then was advanced into different areas in the left upper lobe and fiducials were placed at the sites marked by the computer.  The locatable  ?guide was removed.  A final inspection was made.  There was some mild bloody secretions at the origin of the left upper lobe bronchus that was cleared easily with saline and there was no ongoing bleeding.  The bronchoscope was removed.  The total  ?fluoroscopy time was 408 seconds and the dose was 45 milligray.  The patient then was extubated in the operating room and taken to the [facility] in good condition. ? ? ? ? ? ?PAA ?D: 09/01/2021 5:23:24 pm T: 09/01/2021 11:07:00 pm  ?JOB: [id number]/ [id number]  ?

## 2021-09-01 NOTE — Interval H&P Note (Signed)
History and Physical Interval Note: ? Hypertensive, will defer management to Anesthesiology ? ? ?09/01/2021 ?7:51 AM ? ?Lisa [name]  has presented today for surgery, with the diagnosis of LEFT UPPER LOBE NODULE.  The various methods of treatment have been discussed with the patient and family. After consideration of risks, benefits and other options for treatment, the patient has consented to  Procedure(s): ?VIDEO BRONCHOSCOPY WITH ENDOBRONCHIAL NAVIGATION (N/A) as a surgical intervention.  The patient's history has been reviewed, patient examined, no change in status, stable for surgery.  I have reviewed the patient's chart and labs.  Questions were answered to the patient's satisfaction.   ? ? ?Melrose Nakayama ? ? ?

## 2021-09-01 NOTE — ED Triage Notes (Signed)
Pt c/o shortness of breath and mid chest pain since being discharged from [facility] today after having lung biopsy. ?

## 2021-09-01 NOTE — Brief Op Note (Signed)
09/01/2021 ? ?10:15 AM ? ?PATIENT:  Lisa [name]  87 y.o. female ? ?PRE-OPERATIVE DIAGNOSIS:  LEFT UPPER LOBE NODULE ? ?POST-OPERATIVE DIAGNOSIS:  LEFT UPPER LOBE NODULE, NON-SMALL CELL CARCINOMA ? ?PROCEDURE:  Procedure(s): ?VIDEO BRONCHOSCOPY WITH ENDOBRONCHIAL NAVIGATION (N/A) ?PLACEMENT OF FIDUCIAL (Left) ?Needle aspirations, brushings and transbronchial biopsies ? ?SURGEON:  Surgeon(s) and Role: ?   * Melrose Nakayama, MD - Primary ? ?PHYSICIAN ASSISTANT:  ? ?ASSISTANTS: none  ? ?ANESTHESIA:   general ? ?EBL:  minimal  ? ?BLOOD ADMINISTERED:none ? ?DRAINS: none  ? ?LOCAL MEDICATIONS USED:  NONE ? ?SPECIMEN:  Source of Specimen:  Left upper lobe mass ? ?DISPOSITION OF SPECIMEN:  PATHOLOGY ? ?COUNTS:  NO endo ? ?TOURNIQUET:  * No tourniquets in log * ? ?DICTATION: .Other Dictation: Dictation Number - ? ?PLAN OF CARE: Discharge to home after PACU ? ?PATIENT DISPOSITION:  PACU - hemodynamically stable. ?  ?Delay start of Pharmacological VTE agent (>24hrs) due to surgical blood loss or risk of bleeding: not applicable ? ?

## 2021-09-01 NOTE — Progress Notes (Signed)
BP elevated in short stay 208/57; 205/54. Dr. Lissa Hoard made aware. Patient verbalized that she didn't take her BP medicine this morning because she must drink a full glass of water with medicine and she was NPO after midnight. No new orders at this time. ?Patient received in short stay Albuterol nebulizer for wheezing. Patient tolerated well. Will continue to monitor. ?

## 2021-09-01 NOTE — Anesthesia Postprocedure Evaluation (Signed)
Anesthesia Post Note ? ?Patient: Lisa [name] ? ?Procedure(s) Performed: VIDEO BRONCHOSCOPY WITH ENDOBRONCHIAL NAVIGATION (Chest) ?PLACEMENT OF FIDUCIAL MARKERS TIMES FOUR (Left: Chest) ? ?  ? ?Patient location during evaluation: PACU ?Anesthesia Type: General ?Level of consciousness: sedated and patient cooperative ?Pain management: pain level controlled ?Vital Signs Assessment: post-procedure vital signs reviewed and stable ?Respiratory status: spontaneous breathing ?Cardiovascular status: stable ?Anesthetic complications: no ? ? ?No notable events documented. ? ?Last Vitals:  ?Vitals:  ? 09/01/21 1030 09/01/21 1045  ?BP: (!) 127/53 (!) 132/53  ?Pulse: (!) 53 (!) 55  ?Resp: 18 19  ?Temp:  36.8 ?C  ?SpO2: 100% 94%  ?  ?Last Pain:  ?Vitals:  ? 09/01/21 1045  ?TempSrc:   ?PainSc: 0-No pain  ? ? ?  ?  ?  ?  ?  ?  ? ?Nolon Nations ? ? ? ? ?

## 2021-09-01 NOTE — ED Provider Notes (Signed)
11:43 PM ?Assumed care from Dr. Regenia Skeeter, please see their note for full history, physical and decision making until this point. In brief this is a 87 y.o. year old female who presented to the ED tonight with Shortness of Breath ?    ?Biopsy today. After eatign had some fullness feeling in her chest and mild dyspnea with hypertension. First trop 18 but has CKD. Feels good now. Wants to leave but needs repeat troponin, ecg and reeval.  ? ?Second troponin elevated. Will give dose of home med. Discussed o/n obs for possible NSTEMI and patient prefers to go home if possible. Compromised to check third troponin and dispo based on that.  ? ?Third trop high. BP's not responding to labetalol. Suspect htn crisis. Reviewed the ecg and similar to previous. Heparin started. Cardene started. D/w hospitalist for admission.  ? ?CRITICAL CARE ?Performed by: Merrily Pew ?Total critical care time: 35 minutes ?Critical care time was exclusive of separately billable procedures and treating other patients. ?Critical care was necessary to treat or prevent imminent or life-threatening deterioration. ?Critical care was time spent personally by me on the following activities: development of treatment plan with patient and/or surrogate as well as nursing, discussions with consultants, evaluation of patient's response to treatment, examination of patient, obtaining history from patient or surrogate, ordering and performing treatments and interventions, ordering and review of laboratory studies, ordering and review of radiographic studies, pulse oximetry and re-evaluation of patient's condition. ? ? ?Labs, studies and imaging reviewed by myself and considered in medical decision making if ordered. Imaging interpreted by radiology. ? ?Labs Reviewed  ?CBC - Abnormal; Notable for the following components:  ?    Result Value  ? WBC 3.7 (*)   ? RBC 3.40 (*)   ? Hemoglobin 10.6 (*)   ? HCT 33.6 (*)   ? Platelets 94 (*)   ? All other components  within normal limits  ?BASIC METABOLIC PANEL - Abnormal; Notable for the following components:  ? Glucose, Bld 164 (*)   ? BUN 43 (*)   ? Creatinine, Ser 1.95 (*)   ? GFR, Estimated 25 (*)   ? All other components within normal limits  ?TROPONIN I (HIGH SENSITIVITY) - Abnormal; Notable for the following components:  ? Troponin I (High Sensitivity) 18 (*)   ? All other components within normal limits  ?TROPONIN I (HIGH SENSITIVITY)  ? ? ?DG Chest 2 View  ?Final Result  ?  ? ? ?No follow-ups on file. ? ?  ?Merrily Pew, MD ?09/02/21 [phone] ? ?

## 2021-09-02 ENCOUNTER — Observation Stay (HOSPITAL_COMMUNITY): Payer: Medicare Other

## 2021-09-02 ENCOUNTER — Other Ambulatory Visit: Payer: Self-pay

## 2021-09-02 ENCOUNTER — Other Ambulatory Visit (HOSPITAL_COMMUNITY): Payer: Medicare Other

## 2021-09-02 ENCOUNTER — Encounter (HOSPITAL_COMMUNITY): Payer: Self-pay | Admitting: Thoracic Surgery (Cardiothoracic Vascular Surgery)

## 2021-09-02 ENCOUNTER — Inpatient Hospital Stay (HOSPITAL_COMMUNITY): Payer: Medicare Other

## 2021-09-02 ENCOUNTER — Other Ambulatory Visit (HOSPITAL_COMMUNITY): Payer: Self-pay | Admitting: *Deleted

## 2021-09-02 DIAGNOSIS — R911 Solitary pulmonary nodule: Secondary | ICD-10-CM | POA: Diagnosis not present

## 2021-09-02 DIAGNOSIS — N184 Chronic kidney disease, stage 4 (severe): Secondary | ICD-10-CM | POA: Diagnosis present

## 2021-09-02 DIAGNOSIS — I248 Other forms of acute ischemic heart disease: Secondary | ICD-10-CM | POA: Diagnosis not present

## 2021-09-02 DIAGNOSIS — R079 Chest pain, unspecified: Secondary | ICD-10-CM

## 2021-09-02 DIAGNOSIS — Z8579 Personal history of other malignant neoplasms of lymphoid, hematopoietic and related tissues: Secondary | ICD-10-CM | POA: Diagnosis not present

## 2021-09-02 DIAGNOSIS — D631 Anemia in chronic kidney disease: Secondary | ICD-10-CM | POA: Diagnosis present

## 2021-09-02 DIAGNOSIS — E049 Nontoxic goiter, unspecified: Secondary | ICD-10-CM | POA: Diagnosis not present

## 2021-09-02 DIAGNOSIS — J438 Other emphysema: Secondary | ICD-10-CM

## 2021-09-02 DIAGNOSIS — I214 Non-ST elevation (NSTEMI) myocardial infarction: Secondary | ICD-10-CM | POA: Diagnosis present

## 2021-09-02 DIAGNOSIS — R0902 Hypoxemia: Secondary | ICD-10-CM | POA: Diagnosis not present

## 2021-09-02 DIAGNOSIS — R918 Other nonspecific abnormal finding of lung field: Secondary | ICD-10-CM | POA: Diagnosis not present

## 2021-09-02 DIAGNOSIS — Z8349 Family history of other endocrine, nutritional and metabolic diseases: Secondary | ICD-10-CM | POA: Diagnosis not present

## 2021-09-02 DIAGNOSIS — Z20822 Contact with and (suspected) exposure to covid-19: Secondary | ICD-10-CM | POA: Diagnosis present

## 2021-09-02 DIAGNOSIS — I160 Hypertensive urgency: Principal | ICD-10-CM | POA: Diagnosis present

## 2021-09-02 DIAGNOSIS — Z8249 Family history of ischemic heart disease and other diseases of the circulatory system: Secondary | ICD-10-CM | POA: Diagnosis not present

## 2021-09-02 DIAGNOSIS — Z720 Tobacco use: Secondary | ICD-10-CM

## 2021-09-02 DIAGNOSIS — B182 Chronic viral hepatitis C: Secondary | ICD-10-CM | POA: Diagnosis not present

## 2021-09-02 DIAGNOSIS — J449 Chronic obstructive pulmonary disease, unspecified: Secondary | ICD-10-CM | POA: Diagnosis not present

## 2021-09-02 DIAGNOSIS — R5381 Other malaise: Secondary | ICD-10-CM | POA: Diagnosis present

## 2021-09-02 DIAGNOSIS — Z7982 Long term (current) use of aspirin: Secondary | ICD-10-CM | POA: Diagnosis not present

## 2021-09-02 DIAGNOSIS — F17210 Nicotine dependence, cigarettes, uncomplicated: Secondary | ICD-10-CM | POA: Diagnosis present

## 2021-09-02 DIAGNOSIS — R0603 Acute respiratory distress: Secondary | ICD-10-CM | POA: Diagnosis not present

## 2021-09-02 DIAGNOSIS — I471 Supraventricular tachycardia: Secondary | ICD-10-CM | POA: Diagnosis not present

## 2021-09-02 DIAGNOSIS — R0602 Shortness of breath: Secondary | ICD-10-CM | POA: Diagnosis not present

## 2021-09-02 DIAGNOSIS — C9000 Multiple myeloma not having achieved remission: Secondary | ICD-10-CM | POA: Diagnosis not present

## 2021-09-02 DIAGNOSIS — I209 Angina pectoris, unspecified: Secondary | ICD-10-CM | POA: Diagnosis not present

## 2021-09-02 DIAGNOSIS — R1310 Dysphagia, unspecified: Secondary | ICD-10-CM | POA: Diagnosis not present

## 2021-09-02 DIAGNOSIS — R0789 Other chest pain: Secondary | ICD-10-CM

## 2021-09-02 DIAGNOSIS — C3412 Malignant neoplasm of upper lobe, left bronchus or lung: Secondary | ICD-10-CM | POA: Diagnosis not present

## 2021-09-02 DIAGNOSIS — Z79899 Other long term (current) drug therapy: Secondary | ICD-10-CM | POA: Diagnosis not present

## 2021-09-02 DIAGNOSIS — E042 Nontoxic multinodular goiter: Secondary | ICD-10-CM | POA: Diagnosis not present

## 2021-09-02 DIAGNOSIS — I129 Hypertensive chronic kidney disease with stage 1 through stage 4 chronic kidney disease, or unspecified chronic kidney disease: Secondary | ICD-10-CM | POA: Diagnosis not present

## 2021-09-02 DIAGNOSIS — K224 Dyskinesia of esophagus: Secondary | ICD-10-CM | POA: Diagnosis present

## 2021-09-02 LAB — TROPONIN I (HIGH SENSITIVITY)
Troponin I (High Sensitivity): 117 ng/L (ref <18)
Troponin I (High Sensitivity): 409 ng/L (ref <18)
Troponin I (High Sensitivity): 57 ng/L — ABNORMAL HIGH (ref <18)

## 2021-09-02 LAB — HEPARIN LEVEL (UNFRACTIONATED): Heparin Unfractionated: 0.15 IU/mL — ABNORMAL LOW (ref 0.30–0.70)

## 2021-09-02 MED ORDER — HYDRALAZINE HCL 20 MG/ML IJ SOLN
10.0000 mg | Freq: Four times a day (QID) | INTRAMUSCULAR | Status: DC | PRN
  Administered 2021-09-02 – 2021-09-04 (×3): 10 mg via INTRAVENOUS
  Filled 2021-09-02 (×3): qty 1

## 2021-09-02 MED ORDER — ALBUTEROL SULFATE (2.5 MG/3ML) 0.083% IN NEBU
INHALATION_SOLUTION | RESPIRATORY_TRACT | Status: AC
  Administered 2021-09-02: 2.5 mg via RESPIRATORY_TRACT
  Filled 2021-09-02: qty 3

## 2021-09-02 MED ORDER — LABETALOL HCL 200 MG PO TABS
300.0000 mg | ORAL_TABLET | Freq: Every morning | ORAL | Status: DC
Start: 2021-09-02 — End: 2021-09-03
  Administered 2021-09-02 – 2021-09-03 (×2): 300 mg via ORAL
  Filled 2021-09-02 (×2): qty 2

## 2021-09-02 MED ORDER — ALBUTEROL SULFATE (2.5 MG/3ML) 0.083% IN NEBU
2.5000 mg | INHALATION_SOLUTION | RESPIRATORY_TRACT | Status: DC | PRN
  Administered 2021-09-05: 2.5 mg via RESPIRATORY_TRACT
  Filled 2021-09-02 (×2): qty 3

## 2021-09-02 MED ORDER — TECHNETIUM TO 99M ALBUMIN AGGREGATED
4.2500 | Freq: Once | INTRAVENOUS | Status: AC | PRN
  Administered 2021-09-02: 4.25 via INTRAVENOUS

## 2021-09-02 MED ORDER — ONDANSETRON HCL 4 MG/2ML IJ SOLN
4.0000 mg | Freq: Four times a day (QID) | INTRAMUSCULAR | Status: DC | PRN
Start: 2021-09-02 — End: 2021-09-06

## 2021-09-02 MED ORDER — ACETAMINOPHEN 650 MG RE SUPP
650.0000 mg | Freq: Four times a day (QID) | RECTAL | Status: DC | PRN
Start: 2021-09-02 — End: 2021-09-06

## 2021-09-02 MED ORDER — METHYLPREDNISOLONE SODIUM SUCC 125 MG IJ SOLR
125.0000 mg | Freq: Once | INTRAMUSCULAR | Status: AC
  Administered 2021-09-02: 125 mg via INTRAVENOUS

## 2021-09-02 MED ORDER — ENSURE ENLIVE PO LIQD
237.0000 mL | Freq: Two times a day (BID) | ORAL | Status: DC
  Administered 2021-09-02 – 2021-09-05 (×6): 237 mL via ORAL
  Filled 2021-09-02 (×5): qty 237

## 2021-09-02 MED ORDER — IPRATROPIUM-ALBUTEROL 0.5-2.5 (3) MG/3ML IN SOLN
3.0000 mL | Freq: Four times a day (QID) | RESPIRATORY_TRACT | Status: DC
  Administered 2021-09-02 – 2021-09-03 (×7): 3 mL via RESPIRATORY_TRACT
  Filled 2021-09-02 (×6): qty 3

## 2021-09-02 MED ORDER — BUDESONIDE 0.5 MG/2ML IN SUSP
0.5000 mg | Freq: Two times a day (BID) | RESPIRATORY_TRACT | Status: DC
  Administered 2021-09-02 – 2021-09-06 (×9): 0.5 mg via RESPIRATORY_TRACT
  Filled 2021-09-02 (×9): qty 2

## 2021-09-02 MED ORDER — ASCORBIC ACID 500 MG PO TABS
500.0000 mg | ORAL_TABLET | Freq: Every day | ORAL | Status: DC
  Administered 2021-09-02 – 2021-09-06 (×5): 500 mg via ORAL
  Filled 2021-09-02 (×6): qty 1

## 2021-09-02 MED ORDER — ENOXAPARIN SODIUM 40 MG/0.4ML IJ SOSY: 40.0000 mg | PREFILLED_SYRINGE | INTRAMUSCULAR | Status: DC

## 2021-09-02 MED ORDER — ACETAMINOPHEN 325 MG PO TABS: 650.0000 mg | ORAL_TABLET | Freq: Four times a day (QID) | ORAL | Status: DC | PRN

## 2021-09-02 MED ORDER — HEPARIN (PORCINE) 25000 UT/250ML-% IV SOLN
750.0000 [IU]/h | INTRAVENOUS | Status: DC
  Administered 2021-09-02: 600 [IU]/h via INTRAVENOUS
  Administered 2021-09-03: 750 [IU]/h via INTRAVENOUS
  Filled 2021-09-02 (×2): qty 250

## 2021-09-02 MED ORDER — VITAMIN D 25 MCG (1000 UNIT) PO TABS
5000.0000 [IU] | ORAL_TABLET | Freq: Every day | ORAL | Status: DC
  Administered 2021-09-02 – 2021-09-06 (×5): 5000 [IU] via ORAL
  Filled 2021-09-02 (×6): qty 5

## 2021-09-02 MED ORDER — ONDANSETRON HCL 4 MG PO TABS: 4.0000 mg | ORAL_TABLET | Freq: Four times a day (QID) | ORAL | Status: DC | PRN

## 2021-09-02 MED ORDER — NICARDIPINE HCL IN NACL 20-0.86 MG/200ML-% IV SOLN
3.0000 mg/h | INTRAVENOUS | Status: DC
  Administered 2021-09-02: 5 mg/h via INTRAVENOUS
  Administered 2021-09-02: 1 mg/h via INTRAVENOUS
  Filled 2021-09-02 (×2): qty 200

## 2021-09-02 MED ORDER — METHYLPREDNISOLONE SODIUM SUCC 125 MG IJ SOLR
120.0000 mg | INTRAMUSCULAR | Status: DC
  Administered 2021-09-03 – 2021-09-04 (×2): 120 mg via INTRAVENOUS
  Filled 2021-09-02 (×3): qty 2

## 2021-09-02 MED ORDER — LABETALOL HCL 5 MG/ML IV SOLN
20.0000 mg | Freq: Once | INTRAVENOUS | Status: AC
  Administered 2021-09-02: 20 mg via INTRAVENOUS
  Filled 2021-09-02: qty 4

## 2021-09-02 MED ORDER — CHLORHEXIDINE GLUCONATE CLOTH 2 % EX PADS
6.0000 | MEDICATED_PAD | Freq: Every day | CUTANEOUS | Status: DC
  Administered 2021-09-02: 6 via TOPICAL

## 2021-09-02 MED ORDER — LORAZEPAM 2 MG/ML IJ SOLN
0.5000 mg | Freq: Once | INTRAMUSCULAR | Status: AC
  Administered 2021-09-02: 0.5 mg via INTRAVENOUS
  Filled 2021-09-02: qty 1

## 2021-09-02 MED ORDER — AMLODIPINE BESYLATE 5 MG PO TABS
5.0000 mg | ORAL_TABLET | Freq: Every day | ORAL | Status: DC
  Administered 2021-09-02 – 2021-09-04 (×3): 5 mg via ORAL
  Filled 2021-09-02 (×4): qty 1

## 2021-09-02 MED ORDER — METHYLPREDNISOLONE SODIUM SUCC 125 MG IJ SOLR
INTRAMUSCULAR | Status: AC
  Filled 2021-09-02: qty 2

## 2021-09-02 MED ORDER — LABETALOL HCL 5 MG/ML IV SOLN
10.0000 mg | Freq: Once | INTRAVENOUS | Status: AC
  Administered 2021-09-02: 10 mg via INTRAVENOUS
  Filled 2021-09-02: qty 4

## 2021-09-02 NOTE — ED Notes (Signed)
ED TO INPATIENT HANDOFF REPORT ? ?ED Nurse Name and Phone #: [phone] ? ?S ?Name/Age/Gender ?Lisa [name] ?87 y.o. ?female ?Room/Bed: APA03/APA03 ? ?Code Status ?  Code Status: Full Code ? ?Home/SNF/Other ?Home ?Patient oriented to: self, place, time, and situation ?Is this baseline? Yes  ? ?Triage Complete: Triage complete  ?Chief Complaint ?Hypertensive urgency [I16.0] ? ?Triage Note ?Pt c/o shortness of breath and mid chest pain since being discharged from [facility] today after having lung biopsy.  ? ?Allergies ?No Known Allergies ? ?Level of Care/Admitting Diagnosis ?ED Disposition   ? ? ED Disposition  ?Admit  ? Condition  ?--  ? Comment  ?Hospital Area: [facility] [[id number]] ? Level of Care: Stepdown [14] ? Covid Evaluation: Asymptomatic - no recent exposure (last 10 days) testing not required ? Diagnosis: Hypertensive urgency [761607] ? Admitting Physician: Bernadette Hoit [[id number]] ? Attending Physician: Bernadette Hoit [[id number]] ?  ?  ? ?  ? ? ?B ?Medical/Surgery History ?Past Medical History:  ?Diagnosis Date  ? Anemia   ? Carotid artery stenosis   ? CKD (chronic kidney disease)   ? COPD (chronic obstructive pulmonary disease) ([place])   ? Dyspnea   ? Due to COPD per patient  ? Goiter diffuse, nontoxic   ? Per patient  ? Hepatitis C   ? Hypertension   ? Monoclonal gammopathy of unknown significance (MGUS)   ? Pyelonephritis 04/06/2014  ? UTI  ? ?Past Surgical History:  ?Procedure Laterality Date  ? ABDOMINAL HYSTERECTOMY    ? CHOLECYSTECTOMY N/A 09/17/2018  ? Procedure: LAPAROSCOPIC CHOLECYSTECTOMY;  Surgeon: Virl Cagey, MD;  Location: [facility];  Service: General;  Laterality: N/A;  ? FUDUCIAL PLACEMENT Left 09/01/2021  ? Procedure: PLACEMENT OF FIDUCIAL MARKERS TIMES FOUR;  Surgeon: Melrose Nakayama, MD;  Location: [facility];  Service: Thoracic;  Laterality: Left;  ? TEE WITHOUT CARDIOVERSION N/A 04/10/2014  ? Procedure: TRANSESOPHAGEAL ECHOCARDIOGRAM (TEE);  Surgeon: [name], MD;  Location:  [facility] ENDOSCOPY;  Service: Cardiovascular;  Laterality: N/A;  ? VIDEO BRONCHOSCOPY WITH ENDOBRONCHIAL NAVIGATION N/A 09/01/2021  ? Procedure: VIDEO BRONCHOSCOPY WITH ENDOBRONCHIAL NAVIGATION;  Surgeon: Melrose Nakayama, MD;  Location: [facility];  Service: Thoracic;  Laterality: N/A;  ?  ? ?A ?IV Location/Drains/Wounds ?Patient Lines/Drains/Airways Status   ? ? Active Line/Drains/Airways   ? ? Name Placement date Placement time Site Days  ? Peripheral IV 09/01/21 22 G Anterior;Right Forearm 09/01/21  2146  Forearm  1  ? Peripheral IV 09/02/21 22 G Posterior;Right Forearm 09/02/21  0541  Forearm  less than 1  ? Incision (Closed) 09/17/18 Abdomen Other (Comment) 09/17/18  1037  -- 1081  ? Incision (Closed) 09/01/21 Other (Comment) 09/01/21  0818  -- 1  ? Incision - 4 Ports Abdomen 1: Umbilicus 2: Mid;Upper 3: Right;Upper;Lateral 4: Right;Lateral;Lower 09/17/18  1032  -- 1081  ? ?  ?  ? ?  ? ? ?Intake/Output Last 24 hours ?No intake or output data in the 24 hours ending 09/02/21 1134 ? ?Labs/Imaging ?Results for orders placed or performed during the hospital encounter of 09/01/21 (from the past 48 hour(s))  ?CBC     Status: Abnormal  ? Collection Time: 09/01/21  9:46 PM  ?Result Value Ref Range  ? WBC 3.7 (L) 4.0 - 10.5 K/uL  ? RBC 3.40 (L) 3.87 - 5.11 MIL/uL  ? Hemoglobin 10.6 (L) 12.0 - 15.0 g/dL  ? HCT 33.6 (L) 36.0 - 46.0 %  ? MCV 98.8 80.0 - 100.0 fL  ?  MCH 31.2 26.0 - 34.0 pg  ? MCHC 31.5 30.0 - 36.0 g/dL  ? RDW 12.9 11.5 - 15.5 %  ? Platelets 94 (L) 150 - 400 K/uL  ?  Comment: Immature Platelet Fraction may be ?clinically indicated, consider ?ordering this additional test ?[id number] ?PLATELET COUNT CONFIRMED BY SMEAR ?  ? nRBC 0.0 0.0 - 0.2 %  ?  Comment: Performed at [facility], [address], [place], [place] [address]  ?Basic metabolic panel     Status: Abnormal  ? Collection Time: 09/01/21  9:46 PM  ?Result Value Ref Range  ? Sodium 139 135 - 145 mmol/L  ? Potassium 4.3 3.5 - 5.1 mmol/L  ? Chloride 104 98 - 111  mmol/L  ? CO2 25 22 - 32 mmol/L  ? Glucose, Bld 164 (H) 70 - 99 mg/dL  ?  Comment: Glucose reference range applies only to samples taken after fasting for at least 8 hours.  ? BUN 43 (H) 8 - 23 mg/dL  ? Creatinine, Ser 1.95 (H) 0.44 - 1.00 mg/dL  ? Calcium 9.0 8.9 - 10.3 mg/dL  ? GFR, Estimated 25 (L) >60 mL/min  ?  Comment: (NOTE) ?Calculated using the CKD-EPI Creatinine Equation (2021) ?  ? Anion gap 10 5 - 15  ?  Comment: Performed at [facility], [address], [place], [place] [address]  ?Troponin I (High Sensitivity)     Status: Abnormal  ? Collection Time: 09/01/21  9:46 PM  ?Result Value Ref Range  ? Troponin I (High Sensitivity) 18 (H) <18 ng/L  ?  Comment: (NOTE) ?Elevated high sensitivity troponin I (hsTnI) values and significant  ?changes across serial measurements may suggest ACS but many other  ?chronic and acute conditions are known to elevate hsTnI results.  ?Refer to the "Links" section for chest pain algorithms and additional  ?guidance. ?Performed at [facility], [address], [place], [place] [address] ?  ?Troponin I (High Sensitivity)     Status: Abnormal  ? Collection Time: 09/01/21 11:50 PM  ?Result Value Ref Range  ? Troponin I (High Sensitivity) 57 (H) <18 ng/L  ?  Comment: DELTA CHECK NOTED ?CRITICAL RESULT CALLED TO, READ BACK BY AND VERIFIED WITH: ?A. COE AT 0048 ON 03.24.23 BY ADGER J ?(NOTE) ?Elevated high sensitivity troponin I (hsTnI) values and significant  ?changes across serial measurements may suggest ACS but many other  ?chronic and acute conditions are known to elevate hsTnI results.  ?Refer to the Links section for chest pain algorithms and additional  ?guidance. ?Performed at [facility], [address] [place], [place] [address] ?  ?Troponin I (High Sensitivity)     Status: Abnormal  ? Collection Time: 09/02/21  2:30 AM  ?Result Value Ref Range  ? Troponin I (High Sensitivity) 117 (HH) <18 ng/L  ?  Comment: DELTA CHECK NOTED ?CRITICAL RESULT CALLED TO, READ BACK BY AND  VERIFIED WITH: ?ANGIE COE @ 0401 ON 09/02/21 C VARNER ?(NOTE) ?Elevated high sensitivity troponin I (hsTnI) values and significant  ?changes across serial measurements may suggest ACS but many other  ?chronic and acute conditions are known to elevate hsTnI results.  ?Refer to the Links section for chest pain algorithms and additional  ?guidance. ?Performed at [facility], [address] [place],  [address] ?  ? ?DG Chest 2 View ? ?Result Date: 09/01/2021 ?CLINICAL DATA:  Mid chest pain and shortness of breath, left upper lobe lung biopsy earlier today EXAM: CHEST - 2 VIEW COMPARISON:  09/01/2021 at  10:40 a.m. FINDINGS: Frontal and lateral views of the chest demonstrate a stable cardiac silhouette. Fiduciary markers are again noted at the site of a known left upper lobe mass. No new airspace disease, effusion, or pneumothorax. Calcified thyroid goiter again noted. No acute bony abnormalities. IMPRESSION: 1. Postsurgical changes from left upper lobe mass biopsy, with fiduciary markers in place. No evidence of complication. Electronically Signed   By: Randa Ngo M.D.   On: 09/01/2021 21:12  ? ?DG Chest Port 1 View ? ?Result Date: 09/01/2021 ?CLINICAL DATA:  Status post bronchoscopy EXAM: PORTABLE CHEST - 1 VIEW COMPARISON:  08/30/2021 FINDINGS: Cardiomediastinal silhouette and pulmonary vasculature are within normal limits. Left upper lobe spiculated mass again seen. Multiple metallic markers now noted in the region of this mass. Known upper mediastinal mass better seen on recent CT. No pneumothorax. IMPRESSION: No pneumothorax.  Left upper lobe mass again seen. Electronically Signed   By: Miachel Roux M.D.   On: 09/01/2021 10:49  ? ?DG C-ARM BRONCHOSCOPY ? ?Result Date: 09/01/2021 ?C-ARM BRONCHOSCOPY: Fluoroscopy was utilized by the requesting physician.  No radiographic interpretation.   ? ?Pending Labs ?Unresulted Labs (From admission, onward)  ? ?  Start     Ordered  ? 09/02/21 1300  Heparin level  (unfractionated)  Once-Timed,   STAT       ? 09/02/21 0422  ? ?  ?  ? ?  ? ? ?Vitals/Pain ?Today's Vitals  ? 09/02/21 0745 09/02/21 0800 09/02/21 0930 09/02/21 1000  ?BP: (!) 143/56 (!) 141/64 (!) 150/62 136/60

## 2021-09-02 NOTE — Assessment & Plan Note (Signed)
BP up to 220/88 in ED ?Given labetolol IV x 2 in ED, then started on nicardipine drip ?Restart home labetolol and diltiazem CD and wean off nicardipine ?Her elevated BP likely contributed to her CP ?

## 2021-09-02 NOTE — Progress Notes (Signed)
Report called to Mickel Baas RN on 6E at [facility], family at bedside updated and given new room number and phone number.  ?

## 2021-09-02 NOTE — Progress Notes (Signed)
Patient transferred to [facility] by Care link. Niece called to notify ?

## 2021-09-02 NOTE — H&P (Signed)
?History and Physical  ? ? ?Patient: Lisa [name] XTK:[id number] DOB: 1935-01-07 ?DOA: 09/01/2021 ?DOS: the patient was seen and examined on 09/02/2021 ?PCP: Carrolyn Meiers, MD  ?Patient coming from: Home ? ?Chief Complaint:  ?Chief Complaint  ?Patient presents with  ? Shortness of Breath  ? 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  The patient had bronchoscopy with TBB earlier in the day performed by Dr. Roxan Hockey.  She had been n.p.o. and had not taken any of her medications until she returned home around 2 to 3 PM.  She ate some lunch.  About an hour after eating lunch, she began having substernal chest discomfort and left-sided chest discomfort with some associated shortness of breath.  She described the pain as a burning and dull achy sensation.  She stated that it felt like some heartburn but she did not take any medicines for it.  The episode lasted about 30 minutes and subsided.  After about 15 minutes, her chest pain returned at which time she called her brother to bring her to the emergency department.  The patient had not had any worsening shortness of breath or chest pain prior to yesterday's events.  She continues to smoke 2 to 3 cigarettes/day.  She has an at least 30-pack-year history.  She has not had any fevers, chills, headache, neck pain, nausea, vomiting or diarrhea abdominal pain.  There is no coughing or hemoptysis.   ?Patient states that she has been faithful to her antihypertensive medications.  However, she was n.p.o. for her bronchoscopy on 09/01/2021.  She stated that she took 2 of her antihypertensive medications about an hour before she came to the emergency department yesterday. ?In the ED, patient was afebrile with blood pressure up to 220/88.  Oxygen  saturation was 90-92% on room air.  WBC 3.7, hemoglobin 10.6, platelets 94,000.  BMP showed a sodium 139, potassium 4.3, bicarbonate 25, serum creatinine 1.95.  Chest x-ray showed hyperinflation and postsurgical changes in the left upper lobe.  There is no pneumothorax.  Troponins 18>>57>>118.  The patient was started on IV heparin and nicardipine.  Cardiology was consulted to assist with management. ? ? ?Review of Systems: As mentioned in the history of present illness. All other systems reviewed and are negative. ?Past Medical History:  ?Diagnosis Date  ? Anemia   ? Carotid artery stenosis   ? CKD (chronic kidney disease)   ? COPD (chronic obstructive pulmonary disease) ([place])   ? Dyspnea   ? Due to COPD per patient  ? Goiter diffuse, nontoxic   ? Per patient  ? Hepatitis C   ? Hypertension   ? Monoclonal gammopathy of unknown significance (MGUS)   ? Pyelonephritis 04/06/2014  ? UTI  ? ?Past Surgical History:  ?Procedure Laterality Date  ? ABDOMINAL HYSTERECTOMY    ? CHOLECYSTECTOMY N/A 09/17/2018  ? Procedure: LAPAROSCOPIC CHOLECYSTECTOMY;  Surgeon: Virl Cagey, MD;  Location: [facility];  Service: General;  Laterality: N/A;  ? TEE WITHOUT CARDIOVERSION N/A 04/10/2014  ? Procedure: TRANSESOPHAGEAL ECHOCARDIOGRAM (TEE);  Surgeon: [name], MD;  Location: [facility] ENDOSCOPY;  Service: Cardiovascular;  Laterality: N/A;  ? ?Social History:  reports that she has been smoking cigarettes. She has been smoking an average of .25 packs per day. She has never used smokeless  tobacco. She reports that she does not drink alcohol and does not use drugs. ? ?No Known Allergies ? ?Family History  ?Problem Relation Age of Onset  ? Hypertension Mother   ? Thyroid disease Mother   ? Hypertension Father   ? Thyroid disease Sister   ? Thyroid disease Brother   ? Thyroid disease Sister   ? ? ?Prior to Admission medications   ?Medication Sig Start Date End Date Taking? Authorizing Provider  ?aspirin 81 MG EC tablet Take 81 mg by mouth in  the morning.   Yes [provider]  ?calcium carbonate (OS-CAL - DOSED IN MG OF ELEMENTAL CALCIUM) 1250 (500 Ca) MG tablet Take 1 tablet by mouth daily with breakfast.   Yes [provider]  ?Cholecalciferol (VITAMIN D3) 125 MCG (5000 UT) CAPS Take 5,000 Units by mouth daily.   Yes [provider]  ?diltiazem (CARDIZEM CD) 120 MG 24 hr capsule Take 120 mg by mouth in the morning. 04/12/21  Yes [provider]  ?furosemide (LASIX) 20 MG tablet Take 20 mg by mouth 2 (two) times daily. Morning & afternoon 04/25/21  Yes [provider]  ?Ipratropium-Albuterol (COMBIVENT) 20-100 MCG/ACT AERS respimat Inhale 1 puff into the lungs every 6 (six) hours as needed for wheezing or shortness of breath.    Yes [provider]  ?ipratropium-albuterol (DUONEB) 0.5-2.5 (3) MG/3ML SOLN Take 3 mLs by nebulization every 4 (four) hours as needed (wheezing/shortness of breath).   Yes [provider]  ?labetalol (NORMODYNE) 300 MG tablet Take 300 mg by mouth in the morning.   Yes [provider]  ?feeding supplement, ENSURE COMPLETE, (ENSURE COMPLETE) LIQD Take 237 mLs by mouth 2 (two) times daily between meals. 04/15/14   Cristal Ford, DO  ?vitamin C (ASCORBIC ACID) 500 MG tablet Take 500 mg by mouth daily.    [provider]  ? ? ?Physical Exam: ?Vitals:  ? 09/02/21 0615 09/02/21 0645 09/02/21 0745 09/02/21 0800  ?BP: (!) 137/53 (!) 144/56 (!) 143/56 (!) 141/64  ?Pulse: (!) 57 (!) 56 63 (!) 57  ?Resp: 15 17 15 14   ?Temp:      ?TempSrc:      ?SpO2:  99%  97%  ?Weight:      ?Height:      ? ?GENERAL:  A&O x 3, NAD, well developed, cooperative, follows commands ?HEENT: [place]/AT, No thrush, No icterus, No oral ulcers ?Neck:  No neck mass, No meningismus, soft, supple ?CV: RRR, no S3, no S4, no rub, no JVD ?Lungs:  diminished BS.  Bibasilar rales ?Abd: soft/NT +BS, nondistended ?Ext: trac LE edema, no lymphangitis, no cyanosis, no rashes ?Neuro:  CN II-XII intact,  strength 4/5 in RUE, RLE, strength 4/5 LUE, LLE; sensation intact bilateral; no dysmetria; babinski equivocal ? ?Data Reviewed: ?Data reviewed in history ? ?Assessment and Plan: ?* Hypertensive urgency ?BP up to 220/88 in ED ?Given labetolol IV x 2 in ED, then started on nicardipine drip ?Restart home labetolol and diltiazem CD and wean off nicardipine ?Her elevated BP likely contributed to her CP ? ?Myeloma ([place]) ?IgG Kappa smoldering ?Follow up Dr. Delton Coombes ?Currently on observation only ? ?Tobacco abuse ?Cessation discussed ? ?CKD (chronic kidney disease) stage 4, GFR 15-29 ml/min (HCC) ?Baseline creatinine 1.6-1.9 ?outpt follow up with Dr. Theador Hawthorne ? ?Chest pain ?Mostly atypical by history ?troponins 18>>57>>118 ?Cardiology consulted ?IV heparin started in ED ?V/Q scan ?Echo ?EKG with LVH changes, essentially unchanged from 05/2021 ? ?Anemia due to stage 4 chronic  kidney disease ([place]) ?Baseline Hgb 10 ?No signs active bleed ? ?COPD (chronic obstructive pulmonary disease) ([place]) ?Restart home Duoneb ?Add pulmicort ? ? ? ? ? ? Advance Care Planning: FULL CODE ? ?Consults: cardiology ? ?Family Communication: none ? ?Severity of Illness: ?The appropriate patient status for this patient is OBSERVATION. Observation status is judged to be reasonable and necessary in order to provide the required intensity of service to ensure the patient's safety. The patient's presenting symptoms, physical exam findings, and initial radiographic and laboratory data in the context of their medical condition is felt to place them at decreased risk for further clinical deterioration. Furthermore, it is anticipated that the patient will be medically stable for discharge from the hospital within 2 midnights of admission.  ? ?Author: ?Orson Eva, MD ?09/02/2021 8:19 AM ? ?For on call review [email or web address].  ?

## 2021-09-02 NOTE — Progress Notes (Signed)
?  Transition of Care (TOC) Screening Note ? ? ?Patient Details  ?Name: Lisa [name] ?Date of Birth: 08/06/1934 ? ? ?Transition of Care (TOC) CM/SW Contact:    ?Boneta Lucks, RN ?Phone Number: ?09/02/2021, 1:51 PM ? ? ? ?Transition of Care Department [facility]) has reviewed patient and no TOC needs have been identified at this time. We will continue to monitor patient advancement through interdisciplinary progression rounds. If new patient transition needs arise, please place a TOC consult. ? ? ?

## 2021-09-02 NOTE — Hospital Course (Addendum)
87 year old female with a history of chronic hepatitis C, COPD, hypertension, tobacco abuse, CKD stage IV, smoldering myeloma (on observation only) presenting with chest pain and shortness of breath that started around 3 PM on 09/01/2021.  The patient had bronchoscopy with TBB earlier in the day performed by Dr. Roxan Hockey.  She had been n.p.o. and had not taken any of her medications until she returned home around 2 to 3 PM.  She ate some lunch.  About an hour after eating lunch, she began having substernal chest discomfort and left-sided chest discomfort with some associated shortness of breath.  She described the pain as a burning and dull achy sensation.  She stated that it felt like some heartburn but she did not take any medicines for it.  The episode lasted about 30 minutes and subsided.  After about 15 minutes, her chest pain returned at which time she called her brother to bring her to the emergency department.  The patient had not had any worsening shortness of breath or chest pain prior to yesterday's events.  She continues to smoke 2 to 3 cigarettes/day.  She has an at least 30-pack-year history.  She has not had any fevers, chills, headache, neck pain, nausea, vomiting or diarrhea abdominal pain.  There is no coughing or hemoptysis.   ?Patient states that she has been faithful to her antihypertensive medications.  However, she was n.p.o. for her bronchoscopy on 09/01/2021.  She stated that she took 2 of her antihypertensive medications about an hour before she came to the emergency department yesterday. ?In the ED, patient was afebrile with blood pressure up to 220/88.  Oxygen saturation was 90-92% on room air.  WBC 3.7, hemoglobin 10.6, platelets 94,000.  BMP showed a sodium 139, potassium 4.3, bicarbonate 25, serum creatinine 1.95.  Chest x-ray showed hyperinflation and postsurgical changes in the left upper lobe.  There is no pneumothorax.  Troponins 18>>57>>118.  The patient was started on IV heparin  and nicardipine.  She was also given SL NTG in ED with relief of her CP. Cardiology was consulted to assist with management. ?

## 2021-09-02 NOTE — Progress Notes (Signed)
Family at bedside updated, pt calm and sleeping, vs wnl ?

## 2021-09-02 NOTE — Progress Notes (Addendum)
Responded to nursing call:  chest pain/sob ? ? ?Subjective: ?Patient complains of worsen sob and substernal cp 9/10 ?No n/v.  +diaphoresis ? ?Vitals:  ? 09/02/21 1600 09/02/21 1700 09/02/21 1715 09/02/21 1748  ?BP: (!) 146/49 (!) 188/56 (!) 188/56   ?Pulse: 70     ?Resp: 14 17    ?Temp:      ?TempSrc:      ?SpO2: 100%   98%  ?Weight:      ?Height:      ? ?CV--RRR ?Lung--bilateral wheeze, diminished BS ?Abd--soft+BS/NT ? ? ?Assessment/Plan: ?Angina- ?-troponin up to 409 ?-continue IV heparin ?-cp completely resolved with SL NTG x 2 ?-personally reviewed EKG, unchanged LVH, sinus ?-discussed with cardiology>>transfer to [facility] for cardiology coverage over weekend ? ?COPD exacerbation ?-give albuterol neb now ?-solumedrol 125 mg x 1, then 60 q 12 ?-personally reviewed CXR--chronic interstitial change, no infiltrate, no fulminant edema ?Ativan 0.5 mg IV x 1 ? ?Nephew and family updated ? ? ?Total time 35 min ? ? ? ?Orson Eva, DO ?[facility] ? ?

## 2021-09-02 NOTE — Assessment & Plan Note (Signed)
Baseline Hgb 10 ?No signs active bleed ?

## 2021-09-02 NOTE — Progress Notes (Signed)
ANTICOAGULATION CONSULT NOTE - Initial Consult ? ?Pharmacy Consult for Heparin  ?Indication: chest pain/ACS ? ?No Known Allergies ? ?Patient Measurements: ?Height: 5\' 7"  (170.2 cm) ?Weight: 48.5 kg (107 lb) ?IBW/kg (Calculated) : 61.6 ? ?Vital Signs: ?Temp: 98 ?F (36.7 ?C) (03/23 2038) ?Temp Source: Oral (03/23 2038) ?BP: 214/74 (03/24 0345) ?Pulse Rate: 61 (03/24 0345) ? ?Labs: ?Recent Labs  ?  08/30/21 ?1459 09/01/21 ?2146 09/01/21 ?2350 09/02/21 ?0230  ?HGB 10.8* 10.6*  --   --   ?HCT 33.8* 33.6*  --   --   ?PLT 104* 94*  --   --   ?APTT 27  --   --   --   ?LABPROT 13.3  --   --   --   ?INR 1.0  --   --   --   ?CREATININE 1.67* 1.95*  --   --   ?TROPONINIHS  --  18* 57* 117*  ? ? ?Estimated Creatinine Clearance: 16.4 mL/min (A) (by C-G formula based on SCr of 1.95 mg/dL (H)). ? ? ?Medical History: ?Past Medical History:  ?Diagnosis Date  ? Anemia   ? Carotid artery stenosis   ? CKD (chronic kidney disease)   ? COPD (chronic obstructive pulmonary disease) ([place])   ? Dyspnea   ? Due to COPD per patient  ? Goiter diffuse, nontoxic   ? Per patient  ? Hepatitis C   ? Hypertension   ? Monoclonal gammopathy of unknown significance (MGUS)   ? Pyelonephritis 04/06/2014  ? UTI  ? ? ? ?Assessment: ?87 y/o F with shortness of breath and mildly elevated troponin. Starting heparin. Hgb 10.6. Noted renal dysfunction. PTA meds reviewed.  ? ?Goal of Therapy:  ?Heparin level 0.3-0.7 units/ml ?Monitor platelets by anticoagulation protocol: Yes ?  ?Plan:  ?Will defer bolus with lung biopsy yesterday ?Start heparin at 600 units/hr ?1300 heparin level ? ?Narda Bonds, PharmD, BCPS ?Clinical Pharmacist ?Phone: [phone] ? ? ? ?

## 2021-09-02 NOTE — Progress Notes (Signed)
Pt called due to c/o chest pain, sob, wheezing, feeling hot, and restlessness, Cardiology and primary doc notified, ekg done, meds given as charted, vs noted, O2 at 4L [place], MD at bedside at this time, RT at bedside giving breathing trt. Family updated by MD at bedside. Niece also called and has been updated. Waiting to transfer pt, pt now calm, states pain 0/10, no sob. We will monitor closely. ?

## 2021-09-02 NOTE — Assessment & Plan Note (Signed)
IgG Kappa smoldering ?Follow up Dr. Delton Coombes ?Currently on observation only ?

## 2021-09-02 NOTE — Progress Notes (Signed)
ANTICOAGULATION CONSULT NOTE - Initial Consult ? ?Pharmacy Consult for Heparin  ?Indication: chest pain/ACS ? ?No Known Allergies ? ?Patient Measurements: ?Height: 5\' 7"  (170.2 cm) ?Weight: 48.5 kg (107 lb) ?IBW/kg (Calculated) : 61.6 ? ?Vital Signs: ?Temp: 98.8 ?F (37.1 ?C) (03/24 1140) ?Temp Source: Oral (03/24 1140) ?BP: 157/60 (03/24 1400) ?Pulse Rate: 68 (03/24 1200) ? ?Labs: ?Recent Labs  ?  08/30/21 ?1459 09/01/21 ?2146 09/01/21 ?2350 09/02/21 ?0230 09/02/21 ?1324  ?HGB 10.8* 10.6*  --   --   --   ?HCT 33.8* 33.6*  --   --   --   ?PLT 104* 94*  --   --   --   ?APTT 27  --   --   --   --   ?LABPROT 13.3  --   --   --   --   ?INR 1.0  --   --   --   --   ?HEPARINUNFRC  --   --   --   --  0.15*  ?CREATININE 1.67* 1.95*  --   --   --   ?TROPONINIHS  --  18* 57* 117*  --   ? ? ? ?Estimated Creatinine Clearance: 16.4 mL/min (A) (by C-G formula based on SCr of 1.95 mg/dL (H)). ? ? ?Medical History: ?Past Medical History:  ?Diagnosis Date  ? Anemia   ? Carotid artery stenosis   ? CKD (chronic kidney disease)   ? COPD (chronic obstructive pulmonary disease) ([place])   ? Dyspnea   ? Due to COPD per patient  ? Goiter diffuse, nontoxic   ? Per patient  ? Hepatitis C   ? Hypertension   ? Monoclonal gammopathy of unknown significance (MGUS)   ? Pyelonephritis 04/06/2014  ? UTI  ? ? ? ?Assessment: ?87 y/o F with shortness of breath and mildly elevated troponin. Starting heparin. Hgb 10.6. Noted renal dysfunction. PTA meds reviewed.  ? ?HL 0.15, subtherapeutic  ? ?Goal of Therapy:  ?Heparin level 0.3-0.7 units/ml ?Monitor platelets by anticoagulation protocol: Yes ?  ?Plan:  ?Increase heparin to 900 units/hr ?2300 heparin level ? ?Donna Christen Berthold Glace, PharmD, MBA ?Clinical Pharmacist ? ? ? ? ?

## 2021-09-02 NOTE — Assessment & Plan Note (Signed)
Restart home Duoneb ?Add pulmicort ? ?

## 2021-09-02 NOTE — Assessment & Plan Note (Signed)
Baseline creatinine 1.6-1.9 ?outpt follow up with Dr. Theador Hawthorne ?

## 2021-09-02 NOTE — Assessment & Plan Note (Addendum)
Mostly atypical by history ?troponins 18>>57>>118 ?Cardiology consulted ?IV heparin started in ED ?V/Q scan ?Echo ?EKG with LVH changes, essentially unchanged from 05/2021 ?

## 2021-09-02 NOTE — Consult Note (Addendum)
?Cardiology Consultation:  ? ?Patient ID: Lisa [name] ?MRN: [id number]; DOB: [birth year] ? ?Admit date: 09/01/2021 ?Date of Consult: 09/02/2021 ? ?PCP:  Carrolyn Meiers, MD ?  ?[facility] Providers ?Cardiologist:  None      ? ? ?Patient Profile:  ? ?Lisa [name] is a 87 y.o. female with a hx of COPD, hypertension, tobacco use, CKD stage IV, smoldering myeloma who is being seen 09/02/2021 for the evaluation of chest pain at the request of Dr. Carles Collet. ? ?History of Present Illness:  ? ?Lisa [name] is an 87 year old female with the above medical history who presented with chest pain.  She has recently been evaluated for MGUS and underwent PET CT which showed 1.7 cm spiculated nodule in left upper lobe.  She underwent bronchoscopy and transbronchial biopsy yesterday with Dr. Roxan Hockey.  She had been n.p.o. and returned home around 2 to 3 PM.  About an hour after eating lunch she began having substernal chest pain and shortness of breath.  Described as both a burning in the dull aching pain but reports it felt like heartburn.  Episode lasted about 30 minutes and subsided subsequently but then had second episode of chest pain which prompted her to come to the ED. In the ED, initial vital signs notable for BP 211/88, pulse 79, SPO2 93% on room air.  EKG shows sinus rhythm, rate 81, LVH with repolarization abnormalities.  Labs notable for creatinine 1.95 (appears baseline), hemoglobin 10.6, WBC 3.7, platelets 94, troponin 18 > 57 > 117.  She was started on heparin drip and nicardipine drip.  Most recent BP 141/64.  She is currently chest pain-free. ? ?Most recent echocardiogram on 06/14/21 showed normal biventricular function, severe left atrial enlargement, grade 2 diastolic dysfunction, no significant valvular disease. ? ? ?Past Medical History:  ?Diagnosis Date  ? Anemia   ? Carotid artery stenosis   ? CKD (chronic kidney disease)   ? COPD (chronic obstructive pulmonary disease) ([place])   ? Dyspnea   ?  Due to COPD per patient  ? Goiter diffuse, nontoxic   ? Per patient  ? Hepatitis C   ? Hypertension   ? Monoclonal gammopathy of unknown significance (MGUS)   ? Pyelonephritis 04/06/2014  ? UTI  ? ? ?Past Surgical History:  ?Procedure Laterality Date  ? ABDOMINAL HYSTERECTOMY    ? CHOLECYSTECTOMY N/A 09/17/2018  ? Procedure: LAPAROSCOPIC CHOLECYSTECTOMY;  Surgeon: Virl Cagey, MD;  Location: [facility];  Service: General;  Laterality: N/A;  ? TEE WITHOUT CARDIOVERSION N/A 04/10/2014  ? Procedure: TRANSESOPHAGEAL ECHOCARDIOGRAM (TEE);  Surgeon: [name], MD;  Location: [facility] END[facility]COPY;  Service: Cardiovascular;  Laterality: N/A;  ?  ? ?Inpatient Medications: ?Scheduled Meds: ? budesonide (PULMICORT) nebulizer solution  0.5 mg Nebulization BID  ? ipratropium-albuterol  3 mL Nebulization Q6H  ? ?Continuous Infusions: ? heparin 600 Units/hr (09/02/21 0442)  ? niCARDipine 2.5 mg/hr (09/02/21 0622)  ? ?PRN Meds: ?nitroGLYCERIN ? ?Allergies:   No Known Allergies ? ?Social History:   ?Social History  ? ?Socioeconomic History  ? Marital status: Widowed  ?  Spouse name: Not on file  ? Number of children: Not on file  ? Years of education: Not on file  ? Highest education level: Not on file  ?Occupational History  ? Not on file  ?Tobacco Use  ? Smoking status: Every Day  ?  Packs/day: 0.25  ?  Types: Cigarettes  ? Smokeless tobacco: Never  ?Vaping Use  ? Vaping Use:  Never used  ?Substance and Sexual Activity  ? Alcohol use: No  ? Drug use: No  ? Sexual activity: Not on file  ?Other Topics Concern  ? Not on file  ?Social History Narrative  ? Not on file  ? ?Social Determinants of Health  ? ?Financial Resource Strain: Not on file  ?Food Insecurity: Not on file  ?Transportation Needs: Not on file  ?Physical Activity: Not on file  ?Stress: Not on file  ?Social Connections: Not on file  ?Intimate Partner Violence: Not on file  ?  ?Family History:   ? ?Family History  ?Problem Relation Age of Onset  ? Hypertension Mother   ?  Thyroid disease Mother   ? Hypertension Father   ? Thyroid disease Sister   ? Thyroid disease Brother   ? Thyroid disease Sister   ?  ? ?ROS:  ?Please see the history of present illness.  ? ?All other ROS reviewed and negative.    ? ?Physical Exam/Data:  ? ?Vitals:  ? 09/02/21 0615 09/02/21 0645 09/02/21 0745 09/02/21 0800  ?BP: (!) 137/53 (!) 144/56 (!) 143/56 (!) 141/64  ?Pulse: (!) 57 (!) 56 63 (!) 57  ?Resp: 15 17 15 14   ?Temp:      ?TempSrc:      ?SpO2:  99%  97%  ?Weight:      ?Height:      ? ?No intake or output data in the 24 hours ending 09/02/21 0845 ? ?  09/01/2021  ?  8:44 PM 09/01/2021  ?  6:21 AM 08/30/2021  ?  2:17 PM  ?Last 3 Weights  ?Weight (lbs) 107 lb 107 lb 105 lb 4.8 oz  ?Weight (kg) 48.535 kg 48.535 kg 47.764 kg  ?   ?Body mass index is 16.76 kg/m?.  ?General: Chronically ill-appearing, in no acute distress ?HEENT: normal ?Neck: + JVD ?Cardiac:  normal S1, S2; RRR; no murmur  ?Lungs: Diminished breath sounds at bases ?Abd: soft, nontender, no hepatomegaly  ?Ext: no edema ?Musculoskeletal:  No deformities, BUE and BLE strength normal and equal ?Skin: warm and dry  ?Neuro:   no focal abnormalities noted ?Psych:  Normal affect  ? ?EKG:  The EKG was personally reviewed and demonstrates:  sinus rhythm, rate 81, LVH with repolarization abnormalities. ?Telemetry:  Telemetry was personally reviewed and demonstrates:  NSR 50-60s ? ?Relevant CV Studies: ? ? ?Laboratory Data: ? ?High Sensitivity Troponin:   ?Recent Labs  ?Lab 09/01/21 ?2146 09/01/21 ?2350 09/02/21 ?0230  ?TROPONINIHS 18* 57* 117*  ?   ?Chemistry ?Recent Labs  ?Lab 08/30/21 ?1459 09/01/21 ?2146  ?NA 141 139  ?K 4.4 4.3  ?CL 106 104  ?CO2 24 25  ?GLUCOSE 94 164*  ?BUN 40* 43*  ?CREATININE 1.67* 1.95*  ?CALCIUM 9.2 9.0  ?GFRNONAA 30* 25*  ?ANIONGAP 11 10  ?  ?Recent Labs  ?Lab 08/30/21 ?1459  ?PROT 8.5*  ?ALBUMIN 3.2*  ?AST 27  ?ALT 17  ?ALKPHOS 47  ?BILITOT 0.6  ? ?Lipids No results for input(s): CHOL, TRIG, HDL, LABVLDL, LDLCALC, CHOLHDL in  the last 168 hours.  ?Hematology ?Recent Labs  ?Lab 08/30/21 ?1459 09/01/21 ?2146  ?WBC 3.0* 3.7*  ?RBC 3.48* 3.40*  ?HGB 10.8* 10.6*  ?HCT 33.8* 33.6*  ?MCV 97.1 98.8  ?MCH 31.0 31.2  ?MCHC 32.0 31.5  ?RDW 12.9 12.9  ?PLT 104* 94*  ? ?Thyroid No results for input(s): TSH, FREET4 in the last 168 hours.  ?BNPNo results for input(s): BNP, PROBNP in the last  168 hours.  ?DDimer No results for input(s): DDIMER in the last 168 hours. ? ? ?Radiology/Studies:  ?DG Chest 2 View ? ?Result Date: 09/01/2021 ?CLINICAL DATA:  Mid chest pain and shortness of breath, left upper lobe lung biopsy earlier today EXAM: CHEST - 2 VIEW COMPARISON:  09/01/2021 at 10:40 a.m. FINDINGS: Frontal and lateral views of the chest demonstrate a stable cardiac silhouette. Fiduciary markers are again noted at the site of a known left upper lobe mass. No new airspace disease, effusion, or pneumothorax. Calcified thyroid goiter again noted. No acute bony abnormalities. IMPRESSION: 1. Postsurgical changes from left upper lobe mass biopsy, with fiduciary markers in place. No evidence of complication. Electronically Signed   By: Randa Ngo M.D.   On: 09/01/2021 21:12  ? ?DG Chest 2 View ? ?Result Date: 09/01/2021 ?CLINICAL DATA:  Scheduled for bronchoscopy assisted biopsy of left upper lobe nodule. EXAM: CHEST - 2 VIEW COMPARISON:  CT chest 08/26/2021 FINDINGS: Cardiomediastinal silhouette and pulmonary vasculature are within normal limits. Left upper lobe lung mass again noted.  Lungs otherwise clear. IMPRESSION: 1. No acute cardiopulmonary process. 2. Redemonstration of left upper lobe lung mass. Electronically Signed   By: Miachel Roux M.D.   On: 09/01/2021 10:51  ? ?DG Chest Port 1 View ? ?Result Date: 09/01/2021 ?CLINICAL DATA:  Status post bronchoscopy EXAM: PORTABLE CHEST - 1 VIEW COMPARISON:  08/30/2021 FINDINGS: Cardiomediastinal silhouette and pulmonary vasculature are within normal limits. Left upper lobe spiculated mass again seen. Multiple  metallic markers now noted in the region of this mass. Known upper mediastinal mass better seen on recent CT. No pneumothorax. IMPRESSION: No pneumothorax.  Left upper lobe mass again seen. Electronically S

## 2021-09-02 NOTE — Assessment & Plan Note (Signed)
Cessation discussed ?

## 2021-09-02 NOTE — ED Notes (Signed)
Placed pt on 2l [place] due to spo2 dropping to 87%. Will continue to monitor ?

## 2021-09-03 ENCOUNTER — Other Ambulatory Visit (HOSPITAL_COMMUNITY): Payer: Medicare Other

## 2021-09-03 ENCOUNTER — Inpatient Hospital Stay (HOSPITAL_COMMUNITY): Payer: Medicare Other

## 2021-09-03 DIAGNOSIS — R918 Other nonspecific abnormal finding of lung field: Secondary | ICD-10-CM

## 2021-09-03 DIAGNOSIS — N184 Chronic kidney disease, stage 4 (severe): Secondary | ICD-10-CM | POA: Diagnosis not present

## 2021-09-03 DIAGNOSIS — I214 Non-ST elevation (NSTEMI) myocardial infarction: Secondary | ICD-10-CM | POA: Diagnosis not present

## 2021-09-03 DIAGNOSIS — R079 Chest pain, unspecified: Secondary | ICD-10-CM | POA: Diagnosis not present

## 2021-09-03 DIAGNOSIS — R1310 Dysphagia, unspecified: Secondary | ICD-10-CM

## 2021-09-03 DIAGNOSIS — J438 Other emphysema: Secondary | ICD-10-CM | POA: Diagnosis not present

## 2021-09-03 LAB — BASIC METABOLIC PANEL
Anion gap: 8 (ref 5–15)
BUN: 53 mg/dL — ABNORMAL HIGH (ref 8–23)
CO2: 23 mmol/L (ref 22–32)
Calcium: 8.9 mg/dL (ref 8.9–10.3)
Chloride: 105 mmol/L (ref 98–111)
Creatinine, Ser: 1.71 mg/dL — ABNORMAL HIGH (ref 0.44–1.00)
GFR, Estimated: 29 mL/min — ABNORMAL LOW (ref >60)
Glucose, Bld: 143 mg/dL — ABNORMAL HIGH (ref 70–99)
Potassium: 4.5 mmol/L (ref 3.5–5.1)
Sodium: 136 mmol/L (ref 135–145)

## 2021-09-03 LAB — TROPONIN I (HIGH SENSITIVITY): Troponin I (High Sensitivity): 441 ng/L (ref <18)

## 2021-09-03 LAB — CBC
HCT: 29.6 % — ABNORMAL LOW (ref 36.0–46.0)
Hemoglobin: 9.6 g/dL — ABNORMAL LOW (ref 12.0–15.0)
MCH: 30.8 pg (ref 26.0–34.0)
MCHC: 32.4 g/dL (ref 30.0–36.0)
MCV: 94.9 fL (ref 80.0–100.0)
Platelets: 94 10*3/uL — ABNORMAL LOW (ref 150–400)
RBC: 3.12 MIL/uL — ABNORMAL LOW (ref 3.87–5.11)
RDW: 12.6 % (ref 11.5–15.5)
WBC: 5.3 10*3/uL (ref 4.0–10.5)
nRBC: 0 % (ref 0.0–0.2)

## 2021-09-03 LAB — ECHOCARDIOGRAM COMPLETE
AR max vel: 2.05 cm2
AV Area VTI: 2.17 cm2
AV Area mean vel: 1.93 cm2
AV Mean grad: 5.5 mmHg
AV Peak grad: 9.9 mmHg
Ao pk vel: 1.57 m/s
Area-P 1/2: 3.12 cm2
Calc EF: 67.2 %
Height: 67 in
MV VTI: 2.23 cm2
S' Lateral: 2.3 cm
Single Plane A2C EF: 68.8 %
Single Plane A4C EF: 63.4 %
Weight: 1756.63 oz

## 2021-09-03 LAB — HEPARIN LEVEL (UNFRACTIONATED)
Heparin Unfractionated: 0.74 IU/mL — ABNORMAL HIGH (ref 0.30–0.70)
Heparin Unfractionated: 0.49 IU/mL (ref 0.30–0.70)

## 2021-09-03 LAB — MRSA NEXT GEN BY PCR, NASAL: MRSA by PCR Next Gen: NOT DETECTED

## 2021-09-03 MED ORDER — CARVEDILOL 12.5 MG PO TABS
12.5000 mg | ORAL_TABLET | Freq: Two times a day (BID) | ORAL | Status: DC
  Administered 2021-09-03 – 2021-09-04 (×2): 12.5 mg via ORAL
  Filled 2021-09-03 (×2): qty 1

## 2021-09-03 MED ORDER — IPRATROPIUM-ALBUTEROL 0.5-2.5 (3) MG/3ML IN SOLN
3.0000 mL | Freq: Three times a day (TID) | RESPIRATORY_TRACT | Status: DC
  Filled 2021-09-03: qty 3

## 2021-09-03 MED ORDER — ISOSORBIDE MONONITRATE ER 30 MG PO TB24
30.0000 mg | ORAL_TABLET | Freq: Every day | ORAL | Status: DC
  Administered 2021-09-03 – 2021-09-06 (×4): 30 mg via ORAL
  Filled 2021-09-03 (×4): qty 1

## 2021-09-03 MED ORDER — ASPIRIN EC 81 MG PO TBEC
81.0000 mg | DELAYED_RELEASE_TABLET | Freq: Every day | ORAL | Status: DC
  Administered 2021-09-03 – 2021-09-06 (×4): 81 mg via ORAL
  Filled 2021-09-03 (×4): qty 1

## 2021-09-03 MED ORDER — FUROSEMIDE 40 MG PO TABS
40.0000 mg | ORAL_TABLET | Freq: Every day | ORAL | Status: DC
  Administered 2021-09-03 – 2021-09-06 (×4): 40 mg via ORAL
  Filled 2021-09-03 (×4): qty 1

## 2021-09-03 MED ORDER — NICOTINE 21 MG/24HR TD PT24
21.0000 mg | MEDICATED_PATCH | Freq: Every day | TRANSDERMAL | Status: DC
Start: 2021-09-03 — End: 2021-09-06
  Administered 2021-09-03 – 2021-09-06 (×4): 21 mg via TRANSDERMAL
  Filled 2021-09-03 (×4): qty 1

## 2021-09-03 NOTE — Evaluation (Addendum)
Clinical/Bedside Swallow Evaluation ?Patient Details  ?Name: Lisa [name] ?MRN: [id number] ?Date of Birth: 1934-09-08 ? ?Today's Date: 09/03/2021 ?Time: SLP Start Time (ACUTE ONLY): 1155 SLP Stop Time (ACUTE ONLY): 1220 ?SLP Time Calculation (min) (ACUTE ONLY): 25 min ? ?Past Medical History:  ?Past Medical History:  ?Diagnosis Date  ? Anemia   ? Carotid artery stenosis   ? CKD (chronic kidney disease)   ? COPD (chronic obstructive pulmonary disease) ([place])   ? Dyspnea   ? Due to COPD per patient  ? Goiter diffuse, nontoxic   ? Per patient  ? Hepatitis C   ? Hypertension   ? Monoclonal gammopathy of unknown significance (MGUS)   ? Pyelonephritis 04/06/2014  ? UTI  ? ?Past Surgical History:  ?Past Surgical History:  ?Procedure Laterality Date  ? ABDOMINAL HYSTERECTOMY    ? CHOLECYSTECTOMY N/A 09/17/2018  ? Procedure: LAPAROSCOPIC CHOLECYSTECTOMY;  Surgeon: Virl Cagey, MD;  Location: [facility];  Service: General;  Laterality: N/A;  ? FUDUCIAL PLACEMENT Left 09/01/2021  ? Procedure: PLACEMENT OF FIDUCIAL MARKERS TIMES FOUR;  Surgeon: Melrose Nakayama, MD;  Location: [place];  Service: Thoracic;  Laterality: Left;  ? TEE WITHOUT CARDIOVERSION N/A 04/10/2014  ? Procedure: TRANSESOPHAGEAL ECHOCARDIOGRAM (TEE);  Surgeon: [name], MD;  Location: [facility] ENDOSCOPY;  Service: Cardiovascular;  Laterality: N/A;  ? VIDEO BRONCHOSCOPY WITH ENDOBRONCHIAL NAVIGATION N/A 09/01/2021  ? Procedure: VIDEO BRONCHOSCOPY WITH ENDOBRONCHIAL NAVIGATION;  Surgeon: Melrose Nakayama, MD;  Location: [facility];  Service: Thoracic;  Laterality: N/A;  ? ?HPI:  ?87 year old female with a history of chronic hepatitis C, COPD, hypertension, tobacco abuse, CKD stage IV, smoldering myeloma (on observation only) presenting with chest pain and shortness of breath that started around 3 PM on 09/01/2021.  The patient had bronchoscopy with TBB earlier in the day performed by Dr. Roxan Hockey.  She had been n.p.o. and had not taken any of her medications  until she returned home around 2 to 3 PM.  She ate some lunch.  About an hour after eating lunch, she began having substernal chest discomfort and left-sided chest discomfort with some associated shortness of breath. She stated that it felt like some heartburn but she did not take any medicines for it.  The episode lasted about 30 minutes and subsided.  After about 15 minutes, her chest pain returned at which time she called her brother to bring her to the emergency department; 09/02/21 CXR indicated Stable left upper lobe nodule consistent with suspected lung cancer. 2. Fiduciary markers related to recent endobronchial biopsy. No evidence of complication. 3. Mild vascular congestion without overt edema; BSE ordered.  ?  ?Assessment / Plan / Recommendation  ?Clinical Impression ? Pt presents with primary esophageal dysphagia characterized by reported mid-chest pain, globus sensation and occasional belching during meals.  Pt also informed SLP that she experienced odynophagia (pain with swallowing) post-bronchoscopy, but this has resolved.  Pt consumed thin via cup and straw with graduated bolus size with respiratory function impacting larger swallows as pt stated "that took my breath" after successive swallows of thin.  Trials of smaller volumes given with this sensation eliminated during BSE.  Pt able to prepare and propel puree consistency without dysphagic symptoms evidenced.  Limited dentition impacted mastication efforts with solids and pt/family stating meats are "harder to chew/swallow" during meals.  Recommend initiating a Dysphagia 3 (mechanical/soft) diet with thin liquids during acute stay with ST f/u for esophageal/respiratory precautions and diet tolerance during a meal.  Pt able  to recall swallowing precautions posted in room without difficulty.  Thank you for this consult. ?SLP Visit Diagnosis: Dysphagia, unspecified (R13.10) ?   ?Aspiration Risk ? Mild aspiration risk  ?  ?Diet Recommendation    Dysphagia 3/thin liquids ? ?Medication Administration: Whole meds with liquid  ?  ?Other  Recommendations Oral Care Recommendations: Oral care BID ; May consider esophageal assessment.  ? ?Recommendations for follow up therapy are one component of a multi-disciplinary discharge planning process, led by the attending physician.  Recommendations may be updated based on patient status, additional functional criteria and insurance authorization. ? ?Follow up Recommendations Follow physician's recommendations for discharge plan and follow up therapies  ? ? ?  ?Assistance Recommended at Discharge Intermittent Supervision/Assistance  ?Functional Status Assessment Patient has had a recent decline in their functional status and demonstrates the ability to make significant improvements in function in a reasonable and predictable amount of time.  ?Frequency and Duration min 1 x/week  ?1 week ?  ?   ? ?Prognosis Prognosis for Safe Diet Advancement: Good  ? ?  ? ?Swallow Study   ?General Date of Onset: 09/02/21 ?HPI: 87 year old female with a history of chronic hepatitis C, COPD, hypertension, tobacco abuse, CKD stage IV, smoldering myeloma (on observation only) presenting with chest pain and shortness of breath that started around 3 PM on 09/01/2021.  The patient had bronchoscopy with TBB earlier in the day performed by Dr. Roxan Hockey.  She had been n.p.o. and had not taken any of her medications until she returned home around 2 to 3 PM.  She ate some lunch.  About an hour after eating lunch, she began having substernal chest discomfort and left-sided chest discomfort with some associated shortness of breath.  She described the pain as a burning and dull achy sensation.  She stated that it felt like some heartburn but she did not take any medicines for it.  The episode lasted about 30 minutes and subsided.  After about 15 minutes, her chest pain returned at which time she called her brother to bring her to the emergency  department;09/02/21 CXR indicated Stable left upper lobe nodule consistent with suspected lung cancer. 2. Fiduciary markers related to recent endobronchial biopsy. No evidence of complication. 3. Mild vascular congestion without overt edema; BSE ordered. ?Type of Study: Bedside Swallow Evaluation ?Previous Swallow Assessment: n/a ?Diet Prior to this Study: Thin liquids (full liquids, but family stated she was upgraded to soft and continued to have dysphagia symptoms) ?Temperature Spikes Noted: No ?Respiratory Status: Nasal cannula (2L) ?History of Recent Intubation: No ?Behavior/Cognition: Alert;Cooperative;Pleasant mood ?Oral Cavity Assessment: Within Functional Limits ?Oral Care Completed by SLP: No ?Oral Cavity - Dentition: Missing dentition ?Vision: Functional for self-feeding ?Self-Feeding Abilities: Able to feed self ?Patient Positioning: Upright in bed ?Baseline Vocal Quality: Low vocal intensity ?Volitional Cough: Strong ?Volitional Swallow: Able to elicit  ?  ?Oral/Motor/Sensory Function Overall Oral Motor/Sensory Function: Within functional limits   ?Ice Chips Ice chips: Not tested   ?Thin Liquid Thin Liquid: Within functional limits ?Presentation: Cup;Straw  ?  ?Nectar Thick Nectar Thick Liquid: Not tested   ?Honey Thick Honey Thick Liquid: Not tested   ?Puree Puree: Within functional limits ?Presentation: Self Fed   ?Solid ? ? ?  Solid: Impaired ?Presentation: Self Fed ?Oral Phase Impairments: Impaired mastication ?Oral Phase Functional Implications: Impaired mastication  ? ?  ? ?Elvina Sidle, M.S.,CCC-SLP ?09/03/2021,1:36 PM ? ? ? ?

## 2021-09-03 NOTE — Progress Notes (Addendum)
?PROGRESS NOTE ? ? ? ?Ger Nicks  YQI:[id number] DOB: 12/11/1934 DOA: 09/01/2021 ?PCP: Carrolyn Meiers, MD  ? ? ?Brief Narrative:  ? ?87 year old female with past medical history of chronic hepatitis C, COPD hypertension , tobacco abuse, CKD stage IV, smoldering myeloma (on observation only) presented hospital with chest pain and shortness of breath.  She did have bronchoscopy with  bronchial biopsy by Dr. Dr. Roxan Hockey and postprocedure developed chest discomfort and shortness of breath.  Patient then came back to the ED.  In the ED patient was noted to have blood pressure of two 220/ 88..  Oxygen saturation was 90-92% on room air.  WBC 3.7, hemoglobin 10.6, platelets 94,000. serum creatinine 1.95.  Chest x-ray showed hyperinflation and postsurgical changes in the left upper lobe without any pneumothorax.  Troponins 18>>57>>118.  The patient was started on IV heparin and nicardipine drip and was considered for admission to the hospital.  Cardiology was consulted as well. ?  ?Assessment and Plan: ?Principal Problem: ?  Hypertensive urgency ?Active Problems: ?  COPD (chronic obstructive pulmonary disease) ([place]) ?  Anemia due to stage 4 chronic kidney disease ([place]) ?  Chest pain ?  CKD (chronic kidney disease) stage 4, GFR 15-29 ml/min (HCC) ?  Tobacco abuse ?  Myeloma ([place]) ?  Non-ST elevation (NSTEMI) myocardial infarction [facility]) ?  Odynophagia ?  Mass of upper lobe of left lung ?  ? ?Hypertensive urgency ?Initial blood pressure  up to 220/88 in ED. could be because that she might have missed few doses received labetalol in the ED and was on a Cardene drip which has been discontinued.  Due to bradycardia, Cardizem has been discontinued and started on amlodipine as per cardiology recommendation.  We will continue to monitor closely.  Home Lasix has been initiated.  Imdur has been added to the regimen.  Patient was on labetalol as outpatient which will be changed to Coreg twice a day as per  cardiology. ? ?Left posterior apex 2.1 x 1.6 cm hyper of metabolic mass concerning for primary lung malignancy.  Status post bronchoscopy and transbronchial biopsy on 09/01/2021 by Dr. Koleen Nimrod CT surgery.  Chest x-ray done on 09/02/2021 showed a stable left upper lobe nodule without any evidence of vascular congestion/pneumothorax. ? ?Smoldering myeloma ([place]) ?IgG Kappa smoldering myeloma.  Patient follows up with Dr. Delton Coombes, is currently on observation. ? ?Tobacco abuse ?Cessation was discussed by the previous provider. ? ?CKD (chronic kidney disease) stage 4, GFR 15-29 ml/min (HCC) ?Baseline creatinine 1.6-1.9 patient follows up with Dr. Theador Hawthorne nephrology as outpatient.  Continue to monitor BMP. ? ?Atypical chest pain ?troponins 18>>57>>118.  Cardiology on board.  Recommend IV heparin for now, cardiology recommends heparin for 48 hours..  Check 2D echocardiogram.  Patient is not a candidate for invasive procedures including catheterization due to advanced CKD.  Imdur has been added to the regimen.  EKG showed some mild diffuse ST depression and LVH. ? ?Anemia due to stage 4 chronic kidney disease ([place]) ?Baseline Hgb 10.  We will continue to monitor.  No signs of bleeding. ? ?COPD (chronic obstructive pulmonary disease) ([place]) ?Continue DuoNebs and Pulmicort.  Continues to be dyspneic and short of breath.  On supplemental oxygen.  We will continue to monitor closely.  VQ scan with low probability. ? ?Heavy cigarette smoker.  We will put the patient on nicotine patch.  Spoke with the patient's the niece ? ?Painful swallow.   after bronchoscopy intervention.  Will add barium swallow to assess.  Also get a speech and swallow evaluation.  We will downgrade her diet to full liquids at this time. ? ? DVT prophylaxis:   Heparin drip ? ? ?Code Status:   ?  Code Status: Full Code ? ?Disposition: Home ? ?Status is: Inpatient ? ?Remains inpatient appropriate because: IV heparin drip, Cardiology monitoring ? ? Family  Communication:  ?I spoke with the patient's niece on the phone and updated her about the clinical condition of the patient. ? ?Consultants:  ?Cardiology ? ?Procedures:  ?None ? ?Antimicrobials:  ?None ? ?Anti-infectives (From admission, onward)  ? ? None  ? ?  ? ?Subjective: ?Today, patient was seen and examined at bedside.  Patient had chest pain overnight which improved with nitroglycerin.  Continues to feel dyspneic and short winded.  Denies any nausea vomiting fever or chills.  Wishes me to talk to her niece.  She gets more pain after eating heavy food. ? ?Objective: ?Vitals:  ? 09/03/21 0645 09/03/21 0809 09/03/21 0834 09/03/21 1240  ?BP: (!) 175/67 (!) 169/58  (!) 147/60  ?Pulse:  68 79 74  ?Resp: (!) 21 20 (!) 26 20  ?Temp:  98 ?F (36.7 ?C)  98.2 ?F (36.8 ?C)  ?TempSrc:  Oral  Oral  ?SpO2: 100% 99% 100% 97%  ?Weight:      ?Height:      ? ? ?Intake/Output Summary (Last 24 hours) at 09/03/2021 1330 ?Last data filed at 09/03/2021 0600 ?Gross per 24 hour  ?Intake 609.81 ml  ?Output --  ?Net 609.81 ml  ? ?Filed Weights  ? 09/01/21 2044 09/03/21 0040  ?Weight: 48.5 kg 49.8 kg  ? ? ?Physical Examination: ? ?General:  Average built, not in obvious distress, on nasal cannula oxygen, appears mildly dyspneic ?HENT:   No scleral pallor or icterus noted. Oral mucosa is moist.  ?Chest: Diminished breath sounds bilaterally, no overt wheezes but appears to be slightly tachypneic. ?CVS: S1 &S2 heard. No murmur.  Regular rate and rhythm. ?Abdomen: Soft, nontender, nondistended.  Bowel sounds are heard.   ?Extremities: No cyanosis, clubbing or edema.  Peripheral pulses are palpable. ?Psych: Alert, awake and oriented, normal mood ?CNS:  No cranial nerve deficits.  Power equal in all extremities.   ?Skin: Warm and dry.  No rashes noted. ? ?Data Reviewed:  ? ?CBC: ?Recent Labs  ?Lab 08/30/21 ?1459 09/01/21 ?2146 09/03/21 ?0244  ?WBC 3.0* 3.7* 5.3  ?HGB 10.8* 10.6* 9.6*  ?HCT 33.8* 33.6* 29.6*  ?MCV 97.1 98.8 94.9  ?PLT 104* 94* 94*   ? ? ?Basic Metabolic Panel: ?Recent Labs  ?Lab 08/30/21 ?1459 09/01/21 ?2146 09/03/21 ?0932  ?NA 141 139 136  ?K 4.4 4.3 4.5  ?CL 106 104 105  ?CO2 24 25 23   ?GLUCOSE 94 164* 143*  ?BUN 40* 43* 53*  ?CREATININE 1.67* 1.95* 1.71*  ?CALCIUM 9.2 9.0 8.9  ? ? ?Liver Function Tests: ?Recent Labs  ?Lab 08/30/21 ?1459  ?AST 27  ?ALT 17  ?ALKPHOS 47  ?BILITOT 0.6  ?PROT 8.5*  ?ALBUMIN 3.2*  ? ? ? ?Radiology Studies: ?DG Chest 2 View ? ?Result Date: 09/01/2021 ?CLINICAL DATA:  Mid chest pain and shortness of breath, left upper lobe lung biopsy earlier today EXAM: CHEST - 2 VIEW COMPARISON:  09/01/2021 at 10:40 a.m. FINDINGS: Frontal and lateral views of the chest demonstrate a stable cardiac silhouette. Fiduciary markers are again noted at the site of a known left upper lobe mass. No new airspace disease, effusion, or pneumothorax. Calcified thyroid goiter again noted.  No acute bony abnormalities. IMPRESSION: 1. Postsurgical changes from left upper lobe mass biopsy, with fiduciary markers in place. No evidence of complication. Electronically Signed   By: Randa Ngo M.D.   On: 09/01/2021 21:12  ? ?NM Pulmonary Perfusion ? ?Result Date: 09/02/2021 ?CLINICAL DATA:  Pulmonary embolism suspected. High probability. Lung cancer. Chest pain. Hypoxia. EXAM: NUCLEAR MEDICINE PERFUSION LUNG SCAN TECHNIQUE: Perfusion images were obtained in multiple projections after intravenous injection of radiopharmaceutical. Ventilation scans intentionally deferred if perfusion scan and chest x-ray adequate for interpretation during COVID 19 epidemic. RADIOPHARMACEUTICALS:  4.25 mCi Tc-56m MAA IV COMPARISON:  Chest x-ray September 01, 2021 FINDINGS: Multiple small bilateral defects are identified. No segmental defects noted. IMPRESSION: The study is limited without ventilation imaging. There are multiple small bilateral defects but no segmental defects noted. Low probability for pulmonary embolus. Electronically Signed   By: Dorise Bullion III M.D.    On: 09/02/2021 11:38  ? ?DG CHEST PORT 1 VIEW ? ?Result Date: 09/02/2021 ?CLINICAL DATA:  Respiratory distress, left upper lobe nodule status post endobronchial biopsy EXAM: PORTABLE CHEST 1 VIEW COMPARISON:  09/02/18

## 2021-09-03 NOTE — Progress Notes (Signed)
ANTICOAGULATION CONSULT NOTE  ? ?Pharmacy Consult for Heparin  ?Indication: chest pain/ACS ? ?No Known Allergies ? ?Patient Measurements: ?Height: 5\' 7"  (170.2 cm) ?Weight: 49.8 kg (109 lb 12.6 oz) ?IBW/kg (Calculated) : 61.6 ? ?Vital Signs: ?Temp: 98 ?F (36.7 ?C) (03/25 0040) ?Temp Source: Oral (03/24 2000) ?BP: 149/92 (03/25 0040) ?Pulse Rate: 84 (03/25 0329) ? ?Labs: ?Recent Labs  ?  09/01/21 ?2146 09/01/21 ?2350 09/02/21 ?0230 09/02/21 ?1324 09/02/21 ?1447 09/03/21 ?0244  ?HGB 10.6*  --   --   --   --  9.6*  ?HCT 33.6*  --   --   --   --  29.6*  ?PLT 94*  --   --   --   --  94*  ?HEPARINUNFRC  --   --   --  0.15*  --  0.74*  ?CREATININE 1.95*  --   --   --   --   --   ?TROPONINIHS 18* 57* 117*  --  409*  --   ? ? ? ?Estimated Creatinine Clearance: 16.9 mL/min (A) (by C-G formula based on SCr of 1.95 mg/dL (H)). ? ? ?Medical History: ?Past Medical History:  ?Diagnosis Date  ? Anemia   ? Carotid artery stenosis   ? CKD (chronic kidney disease)   ? COPD (chronic obstructive pulmonary disease) ([place])   ? Dyspnea   ? Due to COPD per patient  ? Goiter diffuse, nontoxic   ? Per patient  ? Hepatitis C   ? Hypertension   ? Monoclonal gammopathy of unknown significance (MGUS)   ? Pyelonephritis 04/06/2014  ? UTI  ? ? ? ?Assessment: ?87 y/o F with shortness of breath and mildly elevated troponin. Starting heparin. Hgb 10.6. Noted renal dysfunction. PTA meds reviewed.  ? ?3/25 AM update:  ?Heparin level just above goal ? ?Goal of Therapy:  ?Heparin level 0.3-0.7 units/ml ?Monitor platelets by anticoagulation protocol: Yes ?  ?Plan:  ?Dec heparin to 750 units/hr ?1200 heparin level ? ?Narda Bonds, PharmD, BCPS ?Clinical Pharmacist ?Phone: [phone] ? ? ? ?

## 2021-09-03 NOTE — Progress Notes (Signed)
ANTICOAGULATION CONSULT NOTE  ? ?Pharmacy Consult for Heparin  ?Indication: chest pain/ACS ? ?No Known Allergies ? ?Patient Measurements: ?Height: 5\' 7"  (170.2 cm) ?Weight: 49.8 kg (109 lb 12.6 oz) ?IBW/kg (Calculated) : 61.6 ? ?Vital Signs: ?Temp: 98.2 ?F (36.8 ?C) (03/25 1240) ?Temp Source: Oral (03/25 1240) ?BP: 147/60 (03/25 1240) ?Pulse Rate: 89 (03/25 1506) ? ?Labs: ?Recent Labs  ?  09/01/21 ?2146 09/01/21 ?2350 09/02/21 ?0230 09/02/21 ?1324 09/02/21 ?1447 09/03/21 ?0244 09/03/21 ?0932 09/03/21 ?1220  ?HGB 10.6*  --   --   --   --  9.6*  --   --   ?HCT 33.6*  --   --   --   --  29.6*  --   --   ?PLT 94*  --   --   --   --  94*  --   --   ?HEPARINUNFRC  --   --   --  0.15*  --  0.74*  --  0.49  ?CREATININE 1.95*  --   --   --   --   --  1.71*  --   ?TROPONINIHS 18*   < > 117*  --  409*  --  441*  --   ? < > = values in this interval not displayed.  ? ? ? ?Estimated Creatinine Clearance: 19.3 mL/min (A) (by C-G formula based on SCr of 1.71 mg/dL (H)). ? ? ?Medical History: ?Past Medical History:  ?Diagnosis Date  ? Anemia   ? Carotid artery stenosis   ? CKD (chronic kidney disease)   ? COPD (chronic obstructive pulmonary disease) ([place])   ? Dyspnea   ? Due to COPD per patient  ? Goiter diffuse, nontoxic   ? Per patient  ? Hepatitis C   ? Hypertension   ? Monoclonal gammopathy of unknown significance (MGUS)   ? Pyelonephritis 04/06/2014  ? UTI  ? ? ? ?Assessment: ?87 y/o F with chest pain and HTN urgency. No anticoagulation prior to admission. Pharmacy consulted for heparin.  ? ?Plan to continue for ~48hrs per cards. Heparin level 0.47 is therapeutic on 750 units/hr. No reported bleeding. ? ?Goal of Therapy:  ?Heparin level 0.3-0.7 units/ml ?Monitor platelets by anticoagulation protocol: Yes ?  ?Plan:  ?Continue heparin to 750 units/hr ?Monitor daily heparin level, CBC ?Monitor for signs/symptoms of bleeding  ? ? ?Benetta Spar, PharmD, BCPS, BCCP ?Clinical Pharmacist ? ?Please check AMION for all [place] phone  numbers ?After 10:00 PM, call [place] [phone] ? ?

## 2021-09-03 NOTE — Progress Notes (Addendum)
? ?Progress Note ? ?Patient Name: Lisa [name] ?Date of Encounter: 09/03/2021 ? ?[facility] Cardiologist: None  ? ?Subjective  ? ?BP 175/67 this morning.  Had chest pain overnight, resolved with nitroglycerin x1.  Currently chest pain-free. ? ?Inpatient Medications  ?  ?Scheduled Meds: ? amLODipine  5 mg Oral Daily  ? vitamin C  500 mg Oral Daily  ? budesonide (PULMICORT) nebulizer solution  0.5 mg Nebulization BID  ? Chlorhexidine Gluconate Cloth  6 each Topical Daily  ? cholecalciferol  5,000 Units Oral Daily  ? feeding supplement  237 mL Oral BID BM  ? ipratropium-albuterol  3 mL Nebulization Q6H  ? labetalol  300 mg Oral q AM  ? methylPREDNISolone (SOLU-MEDROL) injection  120 mg Intravenous Q24H  ? ?Continuous Infusions: ? heparin 750 Units/hr (09/03/21 0428)  ? ?PRN Meds: ?acetaminophen **OR** acetaminophen, albuterol, hydrALAZINE, ondansetron **OR** ondansetron (ZOFRAN) IV  ? ?Vital Signs  ?  ?Vitals:  ? 09/03/21 0040 09/03/21 5056 09/03/21 0428 09/03/21 0645  ?BP: (!) 149/92  (!) 190/70 (!) 175/67  ?Pulse: 63 84 76   ?Resp: 20 (!) 23 16 (!) 21  ?Temp: 98 ?F (36.7 ?C)  98.1 ?F (36.7 ?C)   ?TempSrc:   Oral   ?SpO2: 99% 100% 99% 100%  ?Weight: 49.8 kg     ?Height:      ? ? ?Intake/Output Summary (Last 24 hours) at 09/03/2021 0815 ?Last data filed at 09/03/2021 0600 ?Gross per 24 hour  ?Intake 609.81 ml  ?Output --  ?Net 609.81 ml  ? ? ?  09/03/2021  ? 12:40 AM 09/01/2021  ?  8:44 PM 09/01/2021  ?  6:21 AM  ?Last 3 Weights  ?Weight (lbs) 109 lb 12.6 oz 107 lb 107 lb  ?Weight (kg) 49.8 kg 48.535 kg 48.535 kg  ?   ? ?Telemetry  ?  ?Normal sinus rhythm rate 70s to 80s- Personally Reviewed ? ?ECG  ?  ?LVH, diffuse ST depressions- Personally Reviewed ? ?Physical Exam  ? ?GEN: No acute distress.   ?Neck: +JVD ?Cardiac: RRR, no murmurs, rubs, or gallops.  ?Respiratory: Clear to auscultation bilaterally. ?GI: Soft, nontender, non-distended  ?MS: No edema; No deformity. ?Neuro:  Nonfocal  ?Psych: Normal affect   ? ?Labs  ?  ?High Sensitivity Troponin:   ?Recent Labs  ?Lab 09/01/21 ?2146 09/01/21 ?2350 09/02/21 ?0230 09/02/21 ?1447  ?TROPONINIHS 18* 57* 117* 409*  ?   ?Chemistry ?Recent Labs  ?Lab 08/30/21 ?1459 09/01/21 ?2146  ?NA 141 139  ?K 4.4 4.3  ?CL 106 104  ?CO2 24 25  ?GLUCOSE 94 164*  ?BUN 40* 43*  ?CREATININE 1.67* 1.95*  ?CALCIUM 9.2 9.0  ?PROT 8.5*  --   ?ALBUMIN 3.2*  --   ?AST 27  --   ?ALT 17  --   ?ALKPHOS 47  --   ?BILITOT 0.6  --   ?GFRNONAA 30* 25*  ?ANIONGAP 11 10  ?  ?Lipids No results for input(s): CHOL, TRIG, HDL, LABVLDL, LDLCALC, CHOLHDL in the last 168 hours.  ?Hematology ?Recent Labs  ?Lab 08/30/21 ?1459 09/01/21 ?2146 09/03/21 ?0244  ?WBC 3.0* 3.7* 5.3  ?RBC 3.48* 3.40* 3.12*  ?HGB 10.8* 10.6* 9.6*  ?HCT 33.8* 33.6* 29.6*  ?MCV 97.1 98.8 94.9  ?MCH 31.0 31.2 30.8  ?MCHC 32.0 31.5 32.4  ?RDW 12.9 12.9 12.6  ?PLT 104* 94* 94*  ? ?Thyroid No results for input(s): TSH, FREET4 in the last 168 hours.  ?BNPNo results for input(s): BNP, PROBNP in  the last 168 hours.  ?DDimer No results for input(s): DDIMER in the last 168 hours.  ? ?Radiology  ?  ?DG Chest 2 View ? ?Result Date: 09/01/2021 ?CLINICAL DATA:  Mid chest pain and shortness of breath, left upper lobe lung biopsy earlier today EXAM: CHEST - 2 VIEW COMPARISON:  09/01/2021 at 10:40 a.m. FINDINGS: Frontal and lateral views of the chest demonstrate a stable cardiac silhouette. Fiduciary markers are again noted at the site of a known left upper lobe mass. No new airspace disease, effusion, or pneumothorax. Calcified thyroid goiter again noted. No acute bony abnormalities. IMPRESSION: 1. Postsurgical changes from left upper lobe mass biopsy, with fiduciary markers in place. No evidence of complication. Electronically Signed   By: Randa Ngo M.D.   On: 09/01/2021 21:12  ? ?NM Pulmonary Perfusion ? ?Result Date: 09/02/2021 ?CLINICAL DATA:  Pulmonary embolism suspected. High probability. Lung cancer. Chest pain. Hypoxia. EXAM: NUCLEAR MEDICINE  PERFUSION LUNG SCAN TECHNIQUE: Perfusion images were obtained in multiple projections after intravenous injection of radiopharmaceutical. Ventilation scans intentionally deferred if perfusion scan and chest x-ray adequate for interpretation during COVID 19 epidemic. RADIOPHARMACEUTICALS:  4.25 mCi Tc-4mMAA IV COMPARISON:  Chest x-ray September 01, 2021 FINDINGS: Multiple small bilateral defects are identified. No segmental defects noted. IMPRESSION: The study is limited without ventilation imaging. There are multiple small bilateral defects but no segmental defects noted. Low probability for pulmonary embolus. Electronically Signed   By: DDorise BullionIII M.D.   On: 09/02/2021 11:38  ? ?DG CHEST PORT 1 VIEW ? ?Result Date: 09/02/2021 ?CLINICAL DATA:  Respiratory distress, left upper lobe nodule status post endobronchial biopsy EXAM: PORTABLE CHEST 1 VIEW COMPARISON:  09/01/2021 at 8:36 p.m. FINDINGS: Two frontal views of the chest demonstrate stable nodular consolidation left upper lobe consistent with known hypermetabolic nodule. Fiduciary markers are again noted related to recent endobronchial biopsy. No new airspace disease, effusion, or pneumothorax. Mild vascular congestion. Cardiac silhouette is unremarkable. No acute bony abnormalities. IMPRESSION: 1. Stable left upper lobe nodule consistent with suspected lung cancer. 2. Fiduciary markers related to recent endobronchial biopsy. No evidence of complication. 3. Mild vascular congestion without overt edema. Electronically Signed   By: MRanda NgoM.D.   On: 09/02/2021 18:03  ? ?DG Chest Port 1 View ? ?Result Date: 09/01/2021 ?CLINICAL DATA:  Status post bronchoscopy EXAM: PORTABLE CHEST - 1 VIEW COMPARISON:  08/30/2021 FINDINGS: Cardiomediastinal silhouette and pulmonary vasculature are within normal limits. Left upper lobe spiculated mass again seen. Multiple metallic markers now noted in the region of this mass. Known upper mediastinal mass better seen on  recent CT. No pneumothorax. IMPRESSION: No pneumothorax.  Left upper lobe mass again seen. Electronically Signed   By: FMiachel RouxM.D.   On: 09/01/2021 10:49  ? ?DG C-ARM BRONCHOSCOPY ? ?Result Date: 09/01/2021 ?C-ARM BRONCHOSCOPY: Fluoroscopy was utilized by the requesting physician.  No radiographic interpretation.   ? ?Cardiac Studies  ? ? ? ?Patient Profile  ?   ?87y.o. female with a hx of COPD, hypertension, tobacco use, CKD stage IV, smoldering myeloma who is being seen 09/02/2021 for the evaluation of chest pain ?  ? ?Assessment & Plan  ?  ?Chest pain, troponin elevation: Presented with chest pain after bronchoscopy yesterday with mild troponin elevation 18 > 57 > 117.  EKG shows LVH with repolarization changes, diffuse ST depressions.  Could represent demand ischemia in setting of significantly elevated BP on presentation.   ?-Given her age and comorbidities, including stage  IV CKD, she is not a good candidate for invasive evaluation.  Recommend medical management. ?-Echocardiogram to evaluate for new wall motion abnormality ?-Trend troponins to peak ?-Reasonable to continue heparin gtt x 48 hours  ?-Chest pain has resolved with SL nitroglycerin, will add Imdur 30 mg daily ?  ?Hypertensive urgency: Home regimen includes labetalol 300 mg every morning, diltiazem 120 mg daily, and Lasix 40 mg daily.  Presented with hypertensive urgency, required starting nicardipine drip. ?-Restarted home BP meds and weaned off nicardipine.  She is on an unusual BP regimen with both diltiazem and labetalol, but only once daily labetalol.   ?-Switched diltiazem to amlodipine 5 mg daily.  ?-She is taking labetalol 300 mg but only once daily.  Switch to Coreg 12.5 mg twice daily ?-Add Imdur 30 mg daily ?-Restart home Lasix 40 mg daily ?  ?CKD stage IV: Baseline creatinine 1.6-1.9.  Creatinine 1.95 on presentation.  Labs pending this a.m. ?  ?MGUS: Currently on observation only ?  ?Pulmonary nodule.  PET CT on 08/04/2021 showed no  evidence of active multiple myeloma but did have hypermetabolic nodule in left upper lobe highly concerning for bronchogenic carcinoma.  Underwent bronchoscopy with biopsy with Dr. Roxan Hockey 3/23 ? ?For question

## 2021-09-04 ENCOUNTER — Inpatient Hospital Stay (HOSPITAL_COMMUNITY): Payer: Medicare Other

## 2021-09-04 DIAGNOSIS — N184 Chronic kidney disease, stage 4 (severe): Secondary | ICD-10-CM | POA: Diagnosis not present

## 2021-09-04 DIAGNOSIS — R918 Other nonspecific abnormal finding of lung field: Secondary | ICD-10-CM

## 2021-09-04 DIAGNOSIS — C3412 Malignant neoplasm of upper lobe, left bronchus or lung: Secondary | ICD-10-CM | POA: Diagnosis not present

## 2021-09-04 DIAGNOSIS — I214 Non-ST elevation (NSTEMI) myocardial infarction: Secondary | ICD-10-CM | POA: Diagnosis not present

## 2021-09-04 DIAGNOSIS — I160 Hypertensive urgency: Secondary | ICD-10-CM | POA: Diagnosis not present

## 2021-09-04 DIAGNOSIS — Z20822 Contact with and (suspected) exposure to covid-19: Secondary | ICD-10-CM | POA: Diagnosis not present

## 2021-09-04 DIAGNOSIS — R079 Chest pain, unspecified: Secondary | ICD-10-CM | POA: Diagnosis not present

## 2021-09-04 LAB — BASIC METABOLIC PANEL
Anion gap: 6 (ref 5–15)
BUN: 61 mg/dL — ABNORMAL HIGH (ref 8–23)
CO2: 26 mmol/L (ref 22–32)
Calcium: 8.9 mg/dL (ref 8.9–10.3)
Chloride: 104 mmol/L (ref 98–111)
Creatinine, Ser: 1.71 mg/dL — ABNORMAL HIGH (ref 0.44–1.00)
GFR, Estimated: 29 mL/min — ABNORMAL LOW (ref >60)
Glucose, Bld: 129 mg/dL — ABNORMAL HIGH (ref 70–99)
Potassium: 4.4 mmol/L (ref 3.5–5.1)
Sodium: 136 mmol/L (ref 135–145)

## 2021-09-04 LAB — HEPARIN LEVEL (UNFRACTIONATED): Heparin Unfractionated: 0.48 IU/mL (ref 0.30–0.70)

## 2021-09-04 LAB — CBC
HCT: 28.2 % — ABNORMAL LOW (ref 36.0–46.0)
Hemoglobin: 9.4 g/dL — ABNORMAL LOW (ref 12.0–15.0)
MCH: 30.9 pg (ref 26.0–34.0)
MCHC: 33.3 g/dL (ref 30.0–36.0)
MCV: 92.8 fL (ref 80.0–100.0)
Platelets: 91 10*3/uL — ABNORMAL LOW (ref 150–400)
RBC: 3.04 MIL/uL — ABNORMAL LOW (ref 3.87–5.11)
RDW: 12.7 % (ref 11.5–15.5)
WBC: 4.6 10*3/uL (ref 4.0–10.5)
nRBC: 0 % (ref 0.0–0.2)

## 2021-09-04 LAB — ABO/RH: ABO/RH(D): O POS

## 2021-09-04 MED ORDER — NITROGLYCERIN 0.4 MG SL SUBL
SUBLINGUAL_TABLET | SUBLINGUAL | Status: AC
  Filled 2021-09-04: qty 1

## 2021-09-04 MED ORDER — HEPARIN SODIUM (PORCINE) 5000 UNIT/ML IJ SOLN
5000.0000 [IU] | Freq: Three times a day (TID) | INTRAMUSCULAR | Status: DC
  Administered 2021-09-04 – 2021-09-06 (×6): 5000 [IU] via SUBCUTANEOUS
  Filled 2021-09-04 (×6): qty 1

## 2021-09-04 MED ORDER — SODIUM CHLORIDE 0.9% IV SOLUTION: Freq: Once | INTRAVENOUS | Status: DC

## 2021-09-04 MED ORDER — AMLODIPINE BESYLATE 10 MG PO TABS
10.0000 mg | ORAL_TABLET | Freq: Every day | ORAL | Status: DC
  Administered 2021-09-05 – 2021-09-06 (×2): 10 mg via ORAL
  Filled 2021-09-04 (×2): qty 1

## 2021-09-04 MED ORDER — CARVEDILOL 25 MG PO TABS
25.0000 mg | ORAL_TABLET | Freq: Two times a day (BID) | ORAL | Status: DC
  Administered 2021-09-04 – 2021-09-06 (×4): 25 mg via ORAL
  Filled 2021-09-04 (×5): qty 1

## 2021-09-04 MED ORDER — LEVALBUTEROL HCL 1.25 MG/0.5ML IN NEBU
1.2500 mg | INHALATION_SOLUTION | Freq: Three times a day (TID) | RESPIRATORY_TRACT | Status: DC
  Administered 2021-09-04 (×3): 1.25 mg via RESPIRATORY_TRACT
  Filled 2021-09-04 (×5): qty 0.5

## 2021-09-04 MED ORDER — NITROGLYCERIN 0.4 MG SL SUBL
0.4000 mg | SUBLINGUAL_TABLET | SUBLINGUAL | Status: DC | PRN
  Administered 2021-09-04: 0.4 mg via SUBLINGUAL

## 2021-09-04 NOTE — Progress Notes (Signed)
? ?Progress Note ? ?Patient Name: Lisa [name] ?Date of Encounter: 09/04/2021 ? ?[facility] Cardiologist: None  ? ?Subjective  ? ?BP up to 193/87 this morning.  Had chest pain this morning, resolved with nitroglycerin.  Currently reports no chest pain or shortness of breath ? ?Inpatient Medications  ?  ?Scheduled Meds: ? sodium chloride   Intravenous Once  ? [START ON 09/05/2021] amLODipine  10 mg Oral Daily  ? vitamin C  500 mg Oral Daily  ? aspirin EC  81 mg Oral Daily  ? budesonide (PULMICORT) nebulizer solution  0.5 mg Nebulization BID  ? carvedilol  25 mg Oral BID WC  ? cholecalciferol  5,000 Units Oral Daily  ? feeding supplement  237 mL Oral BID BM  ? furosemide  40 mg Oral Daily  ? isosorbide mononitrate  30 mg Oral Daily  ? levalbuterol  1.25 mg Nebulization TID  ? methylPREDNISolone (SOLU-MEDROL) injection  120 mg Intravenous Q24H  ? nicotine  21 mg Transdermal Daily  ? nitroGLYCERIN      ? ?Continuous Infusions: ? heparin 750 Units/hr (09/03/21 1534)  ? ?PRN Meds: ?acetaminophen **OR** acetaminophen, albuterol, hydrALAZINE, nitroGLYCERIN, ondansetron **OR** ondansetron (ZOFRAN) IV  ? ?Vital Signs  ?  ?Vitals:  ? 09/04/21 0152 09/04/21 0354 09/04/21 0715 09/04/21 0818  ?BP: (!) 163/63 (!) 193/69 (!) 193/87   ?Pulse: 67 87    ?Resp: 20 19 18    ?Temp: 97.9 ?F (36.6 ?C) 98 ?F (36.7 ?C)    ?TempSrc: Oral Oral    ?SpO2: 98% 99%  98%  ?Weight:      ?Height:      ? ? ?Intake/Output Summary (Last 24 hours) at 09/04/2021 0945 ?Last data filed at 09/04/2021 0600 ?Gross per 24 hour  ?Intake 172.47 ml  ?Output 600 ml  ?Net -427.53 ml  ? ? ? ?  09/03/2021  ? 12:40 AM 09/01/2021  ?  8:44 PM 09/01/2021  ?  6:21 AM  ?Last 3 Weights  ?Weight (lbs) 109 lb 12.6 oz 107 lb 107 lb  ?Weight (kg) 49.8 kg 48.535 kg 48.535 kg  ?   ? ?Telemetry  ?  ?Normal sinus rhythm with PACs rate 70s to 80s- Personally Reviewed ? ?ECG  ?  ?LVH, diffuse ST depressions- Personally Reviewed ? ?Physical Exam  ? ?GEN: No acute distress.   ?Neck:  +JVD ?Cardiac: RRR, no murmurs, rubs, or gallops.  ?Respiratory: Expiratory wheezing ?GI: Soft, nontender, non-distended  ?MS: No edema; No deformity. ?Neuro:  Nonfocal  ?Psych: Normal affect  ? ?Labs  ?  ?High Sensitivity Troponin:   ?Recent Labs  ?Lab 09/01/21 ?2146 09/01/21 ?2350 09/02/21 ?0230 09/02/21 ?1447 09/03/21 ?0932  ?TROPONINIHS 18* 57* 117* 409* 441*  ? ?   ?Chemistry ?Recent Labs  ?Lab 08/30/21 ?1459 09/01/21 ?2146 09/03/21 ?0932 09/04/21 ?4970  ?NA 141 139 136 136  ?K 4.4 4.3 4.5 4.4  ?CL 106 104 105 104  ?CO2 24 25 23 26   ?GLUCOSE 94 164* 143* 129*  ?BUN 40* 43* 53* 61*  ?CREATININE 1.67* 1.95* 1.71* 1.71*  ?CALCIUM 9.2 9.0 8.9 8.9  ?PROT 8.5*  --   --   --   ?ALBUMIN 3.2*  --   --   --   ?AST 27  --   --   --   ?ALT 17  --   --   --   ?ALKPHOS 47  --   --   --   ?BILITOT 0.6  --   --   --   ?  GFRNONAA 30* 25* 29* 29*  ?ANIONGAP 11 10 8 6   ? ?  ?Lipids No results for input(s): CHOL, TRIG, HDL, LABVLDL, LDLCALC, CHOLHDL in the last 168 hours.  ?Hematology ?Recent Labs  ?Lab 09/01/21 ?2146 09/03/21 ?0244 09/04/21 ?0332  ?WBC 3.7* 5.3 4.6  ?RBC 3.40* 3.12* 3.04*  ?HGB 10.6* 9.6* 9.4*  ?HCT 33.6* 29.6* 28.2*  ?MCV 98.8 94.9 92.8  ?MCH 31.2 30.8 30.9  ?MCHC 31.5 32.4 33.3  ?RDW 12.9 12.6 12.7  ?PLT 94* 94* 91*  ? ? ?Thyroid No results for input(s): TSH, FREET4 in the last 168 hours.  ?BNPNo results for input(s): BNP, PROBNP in the last 168 hours.  ?DDimer No results for input(s): DDIMER in the last 168 hours.  ? ?Radiology  ?  ?NM Pulmonary Perfusion ? ?Result Date: 09/02/2021 ?CLINICAL DATA:  Pulmonary embolism suspected. High probability. Lung cancer. Chest pain. Hypoxia. EXAM: NUCLEAR MEDICINE PERFUSION LUNG SCAN TECHNIQUE: Perfusion images were obtained in multiple projections after intravenous injection of radiopharmaceutical. Ventilation scans intentionally deferred if perfusion scan and chest x-ray adequate for interpretation during COVID 19 epidemic. RADIOPHARMACEUTICALS:  4.25 mCi Tc-47m MAA IV  COMPARISON:  Chest x-ray September 01, 2021 FINDINGS: Multiple small bilateral defects are identified. No segmental defects noted. IMPRESSION: The study is limited without ventilation imaging. There are multiple small bilateral defects but no segmental defects noted. Low probability for pulmonary embolus. Electronically Signed   By: Dorise Bullion III M.D.   On: 09/02/2021 11:38  ? ?DG CHEST PORT 1 VIEW ? ?Result Date: 09/02/2021 ?CLINICAL DATA:  Respiratory distress, left upper lobe nodule status post endobronchial biopsy EXAM: PORTABLE CHEST 1 VIEW COMPARISON:  09/01/2021 at 8:36 p.m. FINDINGS: Two frontal views of the chest demonstrate stable nodular consolidation left upper lobe consistent with known hypermetabolic nodule. Fiduciary markers are again noted related to recent endobronchial biopsy. No new airspace disease, effusion, or pneumothorax. Mild vascular congestion. Cardiac silhouette is unremarkable. No acute bony abnormalities. IMPRESSION: 1. Stable left upper lobe nodule consistent with suspected lung cancer. 2. Fiduciary markers related to recent endobronchial biopsy. No evidence of complication. 3. Mild vascular congestion without overt edema. Electronically Signed   By: Randa Ngo M.D.   On: 09/02/2021 18:03  ? ?ECHOCARDIOGRAM COMPLETE ? ?Result Date: 09/03/2021 ?   ECHOCARDIOGRAM REPORT   Patient Name:   Lisa [name] Date of Exam: 09/03/2021 Medical Rec #:  [id number]          Height:       67.0 in Accession #:    [id number]         Weight:       109.8 lb Date of Birth:  [birth year] years[birth year]           BSA:          1.567 m? Patient Age:    87 years           BP:           169/58 mmHg Patient Gender: F                  HR:           74 bpm. Exam Location:  Inpatient Procedure: 2D Echo, Cardiac Doppler and Color Doppler Indications:     NSTEMI  History:         Patient has prior history of Echocardiogram examinations, most                  recent 06/14/2021. COPD,  Signs/Symptoms:Dyspnea; Risk                   Factors:Hypertension.  Sonographer:     Luisa Hart RDCS Referring Phys:  [id number] DAVID TAT Diagnosing Phys: Oswaldo Milian MD  Sonographer Comments: Technically difficult study due to poor echo windows. IMPRESSIONS  1. Left ventricular ejection fraction, by estimation, is 70 to 75%. The left ventricle has hyperdynamic function. The left ventricle has no regional wall motion abnormalities. There is moderate left ventricular hypertrophy. Left ventricular diastolic parameters are consistent with Grade II diastolic dysfunction (pseudonormalization). Elevated left atrial pressure.  2. Right ventricular systolic function is normal. The right ventricular size is normal. There is normal pulmonary artery systolic pressure. The estimated right ventricular systolic pressure is 50.5 mmHg.  3. Left atrial size was mildly dilated.  4. The mitral valve is degenerative. No evidence of mitral valve regurgitation. No evidence of mitral stenosis.  5. The aortic valve was not well visualized. Aortic valve regurgitation is not visualized. Aortic valve sclerosis/calcification is present, without any evidence of aortic stenosis.  6. The inferior vena cava is dilated in size with >50% respiratory variability, suggesting right atrial pressure of 8 mmHg. FINDINGS  Left Ventricle: Left ventricular ejection fraction, by estimation, is 70 to 75%. The left ventricle has hyperdynamic function. The left ventricle has no regional wall motion abnormalities. The left ventricular internal cavity size was normal in size. There is moderate left ventricular hypertrophy. Left ventricular diastolic parameters are consistent with Grade II diastolic dysfunction (pseudonormalization). Elevated left atrial pressure. Right Ventricle: The right ventricular size is normal. No increase in right ventricular wall thickness. Right ventricular systolic function is normal. There is normal pulmonary artery systolic pressure. The tricuspid regurgitant velocity is  2.51 m/s, and  with an assumed right atrial pressure of 8 mmHg, the estimated right ventricular systolic pressure is 39.7 mmHg. Left Atrium: Left atrial size was mildly dilated. Right Atrium: Right atrial si

## 2021-09-04 NOTE — Progress Notes (Signed)
?PROGRESS NOTE ? ? ? ?Lisa [name]  CWC:[id number] DOB: 11-10-1934 DOA: 09/01/2021 ?PCP: Carrolyn Meiers, MD  ? ? ?Brief Narrative:  ? ?87 year old female with past medical history of chronic hepatitis C, COPD hypertension , tobacco abuse, CKD stage IV, smoldering myeloma (on observation only) presented hospital with chest pain and shortness of breath.  She did have bronchoscopy with bronchial biopsy by Dr. Dr. Roxan Hockey and postprocedure developed chest discomfort and shortness of breath.  Patient then came back to the ED.  In the ED, patient was noted to have blood pressure of  220/ 88.  Oxygen saturation was 90-92% on room air.  WBC 3.7, hemoglobin 10.6, platelets 94,000. serum creatinine 1.95.  Chest x-ray showed hyperinflation and postsurgical changes in the left upper lobe without any pneumothorax.  Troponins 18>>57>>118.  The patient was started on IV heparin and nicardipine drip and was considered for admission to the hospital.  Cardiology was consulted as well. ?  ?Assessment and Plan: ?Principal Problem: ?  Hypertensive urgency ?Active Problems: ?  COPD (chronic obstructive pulmonary disease) ([place]) ?  Anemia due to stage 4 chronic kidney disease ([place]) ?  Chest pain ?  CKD (chronic kidney disease) stage 4, GFR 15-29 ml/min (HCC) ?  Tobacco abuse ?  Myeloma ([place]) ?  Non-ST elevation (NSTEMI) myocardial infarction [facility]) ?  Odynophagia ?  Mass of upper lobe of left lung ?  ? ?Hypertensive urgency ?Initial blood pressure  up to 220/88 in ED. patient was initially on Cardene drip which has been discontinued.  Due to bradycardia, Cardizem has been discontinued and started on amlodipine.  Continue Lasix.  Imdur has been added to the regimen.  Patient was on labetalol as outpatient which will be changed to Coreg twice a day as per cardiology. ? ?Left posterior apex 2.1 x 1.6 cm hypermetabolic mass concerning for primary lung malignancy.  Status post bronchoscopy and transbronchial biopsy on 09/01/2021  by Dr. Koleen Nimrod CT surgery.  Chest x-ray done on 09/02/2021 showed a stable left upper lobe nodule without any evidence of vascular congestion/pneumothorax.  We will repeat a chest x-ray today due to wheezing and feeling out of difficulty breathing. ? ?Smoldering myeloma ([place]) ?IgG Kappa smoldering myeloma.  Patient follows up with Dr. Delton Coombes, is currently on observation. ? ?Tobacco abuse ?Cessation was discussed by the previous provider.  On nicotine patch. ? ?CKD (chronic kidney disease) stage 4, GFR 15-29 ml/min (HCC) ?Baseline creatinine 1.6-1.9, patient follows up with Dr. Theador Hawthorne nephrology as outpatient.  Creatinine from today at 1.7. ? ?Atypical chest pain ?troponins 18>>57>>118.  Responsive to nitroglycerin.  Cardiology on board suspect due to high blood pressure.  Received 48 hours of IV heparin and has been discontinued at this time.  2D echocardiogram with LV ejection fraction of 70 to 75% with grade 2 diastolic dysfunction..  Patient is not a candidate for invasive procedures including catheterization due to advanced CKD.  Imdur has been added to the regimen.  EKG showed some mild diffuse ST depression and LVH. ? ?Anemia due to stage 4 chronic kidney disease ([place]) ?Baseline Hgb 10.  We will continue to monitor.  No signs of bleeding.  Just hemoglobin of 9.4. ? ?COPD (chronic obstructive pulmonary disease) ([place]) ?Continue DuoNebs and Pulmicort, supplemental oxygen.  We will continue to monitor closely.  VQ scan with low probability.   ? ?Painful swallow.   after bronchoscopy intervention.  Pending barium swallow to assess.  speech and swallow evaluation was performed with recommendation for dysphagia  3 diet.  Patient with previous history of primary esophageal dysphagia.   ? ?Nodular thyroid enlargement.  As per patient patient has non toxic goiter.  We will add a CT scan of the soft tissue of the neck to assess for retrosternal extension/compression. ? ? DVT prophylaxis: heparin injection 5,000  Units Start: 09/04/21 1400  Heparin drip ? ? ?Code Status:   ?  Code Status: Full Code ? ?Disposition: Home ? ?Status is: Inpatient ? ?Remains inpatient appropriate because: IV heparin drip, Cardiology monitoring ? ? Family Communication:  ?I spoke with the patient's niece on the phone and updated her about the clinical condition of the patient on 09/03/2021. ? ?Consultants:  ?Cardiology ? ?Procedures:  ?None ? ?Antimicrobials:  ?None ? ?Anti-infectives (From admission, onward)  ? ? None  ? ?  ? ?Subjective: ? ?Today, patient was seen and examined at bedside.  Complains of retrosternal pain which improved with nitro.  Has difficulty breathing with wheezing.  Respiratory therapist at bedside.  ? ?Objective: ?Vitals:  ? 09/04/21 0152 09/04/21 0354 09/04/21 0715 09/04/21 0818  ?BP: (!) 163/63 (!) 193/69 (!) 193/87   ?Pulse: 67 87    ?Resp: 20 19 18    ?Temp: 97.9 ?F (36.6 ?C) 98 ?F (36.7 ?C)    ?TempSrc: Oral Oral    ?SpO2: 98% 99%  98%  ?Weight:      ?Height:      ? ? ?Intake/Output Summary (Last 24 hours) at 09/04/2021 1046 ?Last data filed at 09/04/2021 0600 ?Gross per 24 hour  ?Intake 172.47 ml  ?Output 600 ml  ?Net -427.53 ml  ? ? ?Filed Weights  ? 09/01/21 2044 09/03/21 0040  ?Weight: 48.5 kg 49.8 kg  ? ? ?Physical Examination: ? ?General:  Average built,, on nasal cannula oxygen, mildly dyspneic, ?HENT:   No scleral pallor or icterus noted. Oral mucosa is moist.  Large nodular goiter noted. ?Chest:  Diminished breath sounds bilaterally.  Expiratory wheezes noted. ?CVS: S1 &S2 heard. No murmur.  Regular rate and rhythm. ?Abdomen: Soft, nontender, nondistended.  Bowel sounds are heard.   ?Extremities: No cyanosis, clubbing or edema.  Peripheral pulses are palpable. ?Psych: Alert, awake and oriented, normal mood ?CNS:  No cranial nerve deficits.  Power equal in all extremities.   ?Skin: Warm and dry.  No rashes noted. ? ? ?Data Reviewed:  ? ?CBC: ?Recent Labs  ?Lab 08/30/21 ?1459 09/01/21 ?2146 09/03/21 ?0244  09/04/21 ?3419  ?WBC 3.0* 3.7* 5.3 4.6  ?HGB 10.8* 10.6* 9.6* 9.4*  ?HCT 33.8* 33.6* 29.6* 28.2*  ?MCV 97.1 98.8 94.9 92.8  ?PLT 104* 94* 94* 91*  ? ? ? ?Basic Metabolic Panel: ?Recent Labs  ?Lab 08/30/21 ?1459 09/01/21 ?2146 09/03/21 ?0932 09/04/21 ?6222  ?NA 141 139 136 136  ?K 4.4 4.3 4.5 4.4  ?CL 106 104 105 104  ?CO2 24 25 23 26   ?GLUCOSE 94 164* 143* 129*  ?BUN 40* 43* 53* 61*  ?CREATININE 1.67* 1.95* 1.71* 1.71*  ?CALCIUM 9.2 9.0 8.9 8.9  ? ? ? ?Liver Function Tests: ?Recent Labs  ?Lab 08/30/21 ?1459  ?AST 27  ?ALT 17  ?ALKPHOS 47  ?BILITOT 0.6  ?PROT 8.5*  ?ALBUMIN 3.2*  ? ? ? ? ?Radiology Studies: ?NM Pulmonary Perfusion ? ?Result Date: 09/02/2021 ?CLINICAL DATA:  Pulmonary embolism suspected. High probability. Lung cancer. Chest pain. Hypoxia. EXAM: NUCLEAR MEDICINE PERFUSION LUNG SCAN TECHNIQUE: Perfusion images were obtained in multiple projections after intravenous injection of radiopharmaceutical. Ventilation scans intentionally deferred if perfusion scan and  chest x-ray adequate for interpretation during COVID 19 epidemic. RADIOPHARMACEUTICALS:  4.25 mCi Tc-13m MAA IV COMPARISON:  Chest x-ray September 01, 2021 FINDINGS: Multiple small bilateral defects are identified. No segmental defects noted. IMPRESSION: The study is limited without ventilation imaging. There are multiple small bilateral defects but no segmental defects noted. Low probability for pulmonary embolus. Electronically Signed   By: Dorise Bullion III M.D.   On: 09/02/2021 11:38  ? ?DG CHEST PORT 1 VIEW ? ?Result Date: 09/02/2021 ?CLINICAL DATA:  Respiratory distress, left upper lobe nodule status post endobronchial biopsy EXAM: PORTABLE CHEST 1 VIEW COMPARISON:  09/01/2021 at 8:36 p.m. FINDINGS: Two frontal views of the chest demonstrate stable nodular consolidation left upper lobe consistent with known hypermetabolic nodule. Fiduciary markers are again noted related to recent endobronchial biopsy. No new airspace disease, effusion, or  pneumothorax. Mild vascular congestion. Cardiac silhouette is unremarkable. No acute bony abnormalities. IMPRESSION: 1. Stable left upper lobe nodule consistent with suspected lung cancer. 2. Fiduciary markers related t

## 2021-09-04 NOTE — Progress Notes (Addendum)
ANTICOAGULATION CONSULT NOTE  ? ?Pharmacy Consult for Heparin  ?Indication: chest pain/ACS ? ?No Known Allergies ? ?Patient Measurements: ?Height: 5\' 7"  (170.2 cm) ?Weight: 49.8 kg (109 lb 12.6 oz) ?IBW/kg (Calculated) : 61.6 ? ?Vital Signs: ?Temp: 98 ?F (36.7 ?C) (03/26 0354) ?Temp Source: Oral (03/26 0354) ?BP: 193/87 (03/26 0715) ?Pulse Rate: 87 (03/26 0354) ? ?Labs: ?Recent Labs  ?  09/01/21 ?2146 09/01/21 ?2350 09/02/21 ?0230 09/02/21 ?1324 09/02/21 ?1447 09/03/21 ?0244 09/03/21 ?0932 09/03/21 ?1220 09/04/21 ?1610  ?HGB 10.6*  --   --   --   --  9.6*  --   --  9.4*  ?HCT 33.6*  --   --   --   --  29.6*  --   --  28.2*  ?PLT 94*  --   --   --   --  94*  --   --  91*  ?HEPARINUNFRC  --   --   --    < >  --  0.74*  --  0.49 0.48  ?CREATININE 1.95*  --   --   --   --   --  1.71*  --  1.71*  ?TROPONINIHS 18*   < > 117*  --  409*  --  441*  --   --   ? < > = values in this interval not displayed.  ? ? ? ?Estimated Creatinine Clearance: 19.3 mL/min (A) (by C-G formula based on SCr of 1.71 mg/dL (H)). ? ? ?Medical History: ?Past Medical History:  ?Diagnosis Date  ? Anemia   ? Carotid artery stenosis   ? CKD (chronic kidney disease)   ? COPD (chronic obstructive pulmonary disease) ([place])   ? Dyspnea   ? Due to COPD per patient  ? Goiter diffuse, nontoxic   ? Per patient  ? Hepatitis C   ? Hypertension   ? Monoclonal gammopathy of unknown significance (MGUS)   ? Pyelonephritis 04/06/2014  ? UTI  ? ? ? ?Assessment: ?87 y/o F with chest pain and HTN urgency. No anticoagulation prior to admission. Pharmacy consulted for heparin.  ? ?Plan to continue for ~48hrs per cards. Heparin level 0.48 is therapeutic on 750 units/hr. No reported bleeding. ? ?Goal of Therapy:  ?Heparin level 0.3-0.7 units/ml ?Monitor platelets by anticoagulation protocol: Yes ?  ?Plan:  ?Continue heparin gtt at 750 units/hr ?Monitor daily heparin level, CBC ?Monitor for signs/symptoms of bleeding  ? ?Thank you for involving pharmacy in this patient's  care. ? ?Elita Quick, PharmD ?PGY1 Ambulatory Care Pharmacy Resident ?09/04/2021 9:41 AM ? ?**Pharmacist phone directory can be found on [email or web address] listed under [place]** ? ?

## 2021-09-04 NOTE — Progress Notes (Addendum)
Patient had an episode of chest pain this morning, 12 lead ECG obtained and given nitroglycerin 0.4 mg and repeated x1. BP elevated and responded to sl nitroglycerin as well as her chest pain, Complained of feeling short of breath as well at that time, lung sounds expiratory wheezing. RT into give neb treatment, changed to Xopenex due to HR 90-100's. Noted to have clear lung sounds and wheezing in the throat area. Also has had some difficulty with swallowing. Dr. Gardiner Rhyme viewed the ecg and noted no changes from previous ecg.  Dr. Louanne Belton at bedside and discussed above information with patient. Orders for CT scan and xray written. Patient resting comfortably after episode with no further complaints this shift.  ?

## 2021-09-05 ENCOUNTER — Inpatient Hospital Stay (HOSPITAL_COMMUNITY): Payer: Medicare Other

## 2021-09-05 DIAGNOSIS — R0602 Shortness of breath: Secondary | ICD-10-CM

## 2021-09-05 DIAGNOSIS — N184 Chronic kidney disease, stage 4 (severe): Secondary | ICD-10-CM | POA: Diagnosis not present

## 2021-09-05 DIAGNOSIS — E049 Nontoxic goiter, unspecified: Secondary | ICD-10-CM

## 2021-09-05 DIAGNOSIS — I160 Hypertensive urgency: Secondary | ICD-10-CM | POA: Diagnosis not present

## 2021-09-05 DIAGNOSIS — R079 Chest pain, unspecified: Secondary | ICD-10-CM | POA: Diagnosis not present

## 2021-09-05 DIAGNOSIS — E042 Nontoxic multinodular goiter: Secondary | ICD-10-CM

## 2021-09-05 DIAGNOSIS — I214 Non-ST elevation (NSTEMI) myocardial infarction: Secondary | ICD-10-CM | POA: Diagnosis not present

## 2021-09-05 LAB — CYTOLOGY - NON PAP

## 2021-09-05 LAB — CBC
HCT: 30.6 % — ABNORMAL LOW (ref 36.0–46.0)
Hemoglobin: 10.1 g/dL — ABNORMAL LOW (ref 12.0–15.0)
MCH: 30.8 pg (ref 26.0–34.0)
MCHC: 33 g/dL (ref 30.0–36.0)
MCV: 93.3 fL (ref 80.0–100.0)
Platelets: 103 10*3/uL — ABNORMAL LOW (ref 150–400)
RBC: 3.28 MIL/uL — ABNORMAL LOW (ref 3.87–5.11)
RDW: 12.6 % (ref 11.5–15.5)
WBC: 4.9 10*3/uL (ref 4.0–10.5)
nRBC: 0 % (ref 0.0–0.2)

## 2021-09-05 LAB — SURGICAL PATHOLOGY

## 2021-09-05 MED ORDER — IPRATROPIUM-ALBUTEROL 0.5-2.5 (3) MG/3ML IN SOLN
3.0000 mL | Freq: Three times a day (TID) | RESPIRATORY_TRACT | Status: DC
  Administered 2021-09-06: 3 mL via RESPIRATORY_TRACT

## 2021-09-05 MED ORDER — IPRATROPIUM-ALBUTEROL 0.5-2.5 (3) MG/3ML IN SOLN
3.0000 mL | Freq: Four times a day (QID) | RESPIRATORY_TRACT | Status: DC
  Administered 2021-09-05 (×3): 3 mL via RESPIRATORY_TRACT
  Filled 2021-09-05 (×3): qty 3

## 2021-09-05 MED ORDER — PREDNISONE 10 MG PO TABS
30.0000 mg | ORAL_TABLET | Freq: Every day | ORAL | Status: DC
Start: 2021-09-06 — End: 2021-09-06
  Administered 2021-09-06: 30 mg via ORAL
  Filled 2021-09-05 (×2): qty 1

## 2021-09-05 MED ORDER — TRAZODONE HCL 50 MG PO TABS
50.0000 mg | ORAL_TABLET | ORAL | Status: AC
  Administered 2021-09-05: 50 mg via ORAL
  Filled 2021-09-05: qty 1

## 2021-09-05 NOTE — Progress Notes (Signed)
CCMD reported pt had a 15 beat run on SVT. Pt resting comfortably. No c/o pain/discomfort. [place] NP paged and notified. Pt currently in sinus.  ?

## 2021-09-05 NOTE — Progress Notes (Signed)
? ?Progress Note ? ?Patient Name: Lisa [name] ?Date of Encounter: 09/05/2021 ? ?[facility] Cardiologist: Donato Heinz, MD  ? ?Subjective  ? ? No chest pain no SOB ? ?Inpatient Medications  ?  ?Scheduled Meds: ? sodium chloride   Intravenous Once  ? amLODipine  10 mg Oral Daily  ? vitamin C  500 mg Oral Daily  ? aspirin EC  81 mg Oral Daily  ? budesonide (PULMICORT) nebulizer solution  0.5 mg Nebulization BID  ? carvedilol  25 mg Oral BID WC  ? cholecalciferol  5,000 Units Oral Daily  ? feeding supplement  237 mL Oral BID BM  ? furosemide  40 mg Oral Daily  ? heparin  5,000 Units Subcutaneous Q8H  ? ipratropium-albuterol  3 mL Nebulization Q6H  ? isosorbide mononitrate  30 mg Oral Daily  ? methylPREDNISolone (SOLU-MEDROL) injection  120 mg Intravenous Q24H  ? nicotine  21 mg Transdermal Daily  ? ?Continuous Infusions: ? ?PRN Meds: ?acetaminophen **OR** acetaminophen, albuterol, hydrALAZINE, nitroGLYCERIN, ondansetron **OR** ondansetron (ZOFRAN) IV  ? ?Vital Signs  ?  ?Vitals:  ? 09/05/21 1610 09/05/21 9604 09/05/21 0827 09/05/21 0959  ?BP: (!) 156/61 (!) 147/91  (!) 169/68  ?Pulse:      ?Resp: (!) 24 19  (!) 23  ?Temp:  97.6 ?F (36.4 ?C)  98.2 ?F (36.8 ?C)  ?TempSrc:  Oral  Oral  ?SpO2: 97% 100% 98% 100%  ?Weight:      ?Height:      ? ?No intake or output data in the 24 hours ending 09/05/21 1215 ? ?  09/03/2021  ? 12:40 AM 09/01/2021  ?  8:44 PM 09/01/2021  ?  6:21 AM  ?Last 3 Weights  ?Weight (lbs) 109 lb 12.6 oz 107 lb 107 lb  ?Weight (kg) 49.8 kg 48.535 kg 48.535 kg  ?   ? ?Telemetry  ?  ?An episode of SVT 150 otherwise her P waves are shour freq. - Personally Reviewed ? ?ECG  ?  ?No new - Personally Reviewed ? ?Physical Exam  ? ?GEN: No acute distress.   ?Neck: No JVD ?Cardiac: RRR, no murmurs, rubs, or gallops.  ?Respiratory: Clear to auscultation bilaterally. ?GI: Soft, nontender, non-distended  ?MS: No edema; No deformity. ?Neuro:  Nonfocal  ?Psych: Normal affect  ? ?Labs  ?  ?High  Sensitivity Troponin:   ?Recent Labs  ?Lab 09/01/21 ?2146 09/01/21 ?2350 09/02/21 ?0230 09/02/21 ?1447 09/03/21 ?0932  ?TROPONINIHS 18* 57* 117* 409* 441*  ? ?   ?Chemistry ?Recent Labs  ?Lab 08/30/21 ?1459 09/01/21 ?2146 09/03/21 ?0932 09/04/21 ?5409  ?NA 141 139 136 136  ?K 4.4 4.3 4.5 4.4  ?CL 106 104 105 104  ?CO2 24 25 23 26   ?GLUCOSE 94 164* 143* 129*  ?BUN 40* 43* 53* 61*  ?CREATININE 1.67* 1.95* 1.71* 1.71*  ?CALCIUM 9.2 9.0 8.9 8.9  ?PROT 8.5*  --   --   --   ?ALBUMIN 3.2*  --   --   --   ?AST 27  --   --   --   ?ALT 17  --   --   --   ?ALKPHOS 47  --   --   --   ?BILITOT 0.6  --   --   --   ?GFRNONAA 30* 25* 29* 29*  ?ANIONGAP 11 10 8 6   ? ?  ?Lipids No results for input(s): CHOL, TRIG, HDL, LABVLDL, LDLCALC, CHOLHDL in the last 168 hours.  ?Hematology ?Recent  Labs  ?Lab 09/03/21 ?0244 09/04/21 ?0332 09/05/21 ?0233  ?WBC 5.3 4.6 4.9  ?RBC 3.12* 3.04* 3.28*  ?HGB 9.6* 9.4* 10.1*  ?HCT 29.6* 28.2* 30.6*  ?MCV 94.9 92.8 93.3  ?MCH 30.8 30.9 30.8  ?MCHC 32.4 33.3 33.0  ?RDW 12.6 12.7 12.6  ?PLT 94* 91* 103*  ? ? ?Thyroid No results for input(s): TSH, FREET4 in the last 168 hours.  ?BNPNo results for input(s): BNP, PROBNP in the last 168 hours.  ?DDimer No results for input(s): DDIMER in the last 168 hours.  ? ?Radiology  ?  ?CT SOFT TISSUE NECK WO CONTRAST ? ?Result Date: 09/04/2021 ?CLINICAL DATA:  Painful swallowing, dysphagia, large goiter EXAM: CT NECK WITHOUT CONTRAST TECHNIQUE: Multidetector CT imaging of the neck was performed following the standard protocol without intravenous contrast. RADIATION DOSE REDUCTION: This exam was performed according to the departmental dose-optimization program which includes automated exposure control, adjustment of the mA and/or kV according to patient size and/or use of iterative reconstruction technique. COMPARISON:  CT neck 09/16/2018 FINDINGS: Evaluation is degraded by noncontrast technique as well as motion and streak artifact through the neck. Pharynx and larynx:  The nasal cavity and nasopharynx are grossly unremarkable. The oral cavity and oropharynx are grossly unremarkable. The parapharyngeal fat appears preserved. The hypopharynx and larynx are grossly unremarkable, though the vocal folds are not well assessed. Salivary glands: The parotid glands are unremarkable. The submandibular glands are not well seen. Thyroid: The thyroid is markedly enlarged extending inferiorly into the upper mediastinum as well as superiorly into the left neck to the level of the angle of the mandible. The thyroid parenchyma appears heterogeneous with coarse internal calcifications. Overall, the size and appearance is grossly similar to the study from 2020. The thyroid exert mass effect on the trachea with slight rightward deviation of the level of the clavicular heads but no significant luminal narrowing. Lymph nodes: No pathologic lymphadenopathy is seen in the neck. Vascular: Calcified plaque is seen in the bilateral carotid bifurcations and aortic arch. Limited intracranial: The imaged portions of the intracranial compartment are grossly unremarkable. Visualized orbits: The globes and orbits are unremarkable. Mastoids and visualized paranasal sinuses: The paranasal sinuses and mastoid air cells are clear. Skeleton: There is advanced multilevel degenerative change of the cervical spine with grade 1 anterolisthesis of C4 on C5. Disc space narrowing and degenerative endplate irregularity is most advanced at C3-C4, C5-C6, C6-C7, and C7-T1, grossly similar to the prior CT from 2020. There is no evidence of acute osseous abnormality or aggressive osseous lesion. Upper chest: There is consolidative opacity in the left upper lobe. Opacity surrounding the solid nodule seen on the CT chest from 08/26/2021 have increased. No apical pneumothorax is seen. Other: None. IMPRESSION: 1. Image quality is suboptimal due to noncontrast technique as well as streak and motion artifact. 2. Within this confine, the  massively enlarged thyroid is grossly similar in size and appearance compared to the CT from 09/16/2018. The thyroid extends into the upper mediastinum as well as into the left neck to the level of the angle of the mandible. There is mild mass effect on the surrounding structures with slight rightward tracheal deviation of the level of the clavicular heads but no significant luminal narrowing. 3. Consolidative opacity in the left upper lobe in the location of a previously identified nodule seen on the CT chest of 08/26/2021. Opacity surrounding the nodule have increased since that study, which may reflect postprocedural hemorrhage. No apical pneumothorax is seen. Electronically Signed  By: Valetta Mole M.D.   On: 09/04/2021 16:14  ? ?DG CHEST PORT 1 VIEW ? ?Result Date: 09/04/2021 ?CLINICAL DATA:  88 year old female with history of cough and shortness of breath. EXAM: PORTABLE CHEST 1 VIEW COMPARISON:  Chest x-ray 09/02/2021. FINDINGS: Fiducial markers in the left upper lobe. Ill-defined mass-like opacity adjacent to the fiducial markers corresponding to known left upper lobe pulmonary nodule, likely with a small amount of perilesional hemorrhage related to recent biopsy, similar to the recent prior study. Lungs are otherwise clear. No pleural effusions. No pneumothorax. No evidence of pulmonary edema. Heart size is normal. Upper mediastinal contours are within normal limits. Atherosclerotic calcifications are noted in the thoracic aorta. IMPRESSION: 1. Left upper lobe pulmonary nodule again noted, with ill-defined opacities surrounding the pulmonary nodules, new compared to pre biopsy study 08/30/2021, likely reflective of a small amount of perilesional hemorrhage. This appears similar to the most recent prior study. Electronically Signed   By: Vinnie Langton M.D.   On: 09/04/2021 11:33  ? ?ECHOCARDIOGRAM COMPLETE ? ?Result Date: 09/03/2021 ?   ECHOCARDIOGRAM REPORT   Patient Name:   YAJAIRA DOFFING DAVIS Date of  Exam: 09/03/2021 Medical Rec #:  [id number]          Height:       67.0 in Accession #:    [id number]         Weight:       109.8 lb Date of Birth:  [birth year]           BSA:          1.567 m? Patient Age:    87 years

## 2021-09-05 NOTE — Progress Notes (Signed)
? ?Progress Note ? ?Patient Name: Lisa [name] ?Date of Encounter: 09/05/2021 ? ?[facility] Cardiologist: Donato Heinz, MD  ? ?Subjective  ? ? No chest pain no SOB ? ?Inpatient Medications  ?  ?Scheduled Meds: ? sodium chloride   Intravenous Once  ? amLODipine  10 mg Oral Daily  ? vitamin C  500 mg Oral Daily  ? aspirin EC  81 mg Oral Daily  ? budesonide (PULMICORT) nebulizer solution  0.5 mg Nebulization BID  ? carvedilol  25 mg Oral BID WC  ? cholecalciferol  5,000 Units Oral Daily  ? feeding supplement  237 mL Oral BID BM  ? furosemide  40 mg Oral Daily  ? heparin  5,000 Units Subcutaneous Q8H  ? ipratropium-albuterol  3 mL Nebulization Q6H  ? isosorbide mononitrate  30 mg Oral Daily  ? methylPREDNISolone (SOLU-MEDROL) injection  120 mg Intravenous Q24H  ? nicotine  21 mg Transdermal Daily  ? ?Continuous Infusions: ? ?PRN Meds: ?acetaminophen **OR** acetaminophen, albuterol, hydrALAZINE, nitroGLYCERIN, ondansetron **OR** ondansetron (ZOFRAN) IV  ? ?Vital Signs  ?  ?Vitals:  ? 09/05/21 5170 09/05/21 0174 09/05/21 9449 09/05/21 0827  ?BP: (!) 156/62 (!) 156/61 (!) 147/91   ?Pulse:      ?Resp: (!) 22 (!) 24 19   ?Temp:   97.6 ?F (36.4 ?C)   ?TempSrc:   Oral   ?SpO2: 97% 97% 100% 98%  ?Weight:      ?Height:      ? ?No intake or output data in the 24 hours ending 09/05/21 0846 ? ?  09/03/2021  ? 12:40 AM 09/01/2021  ?  8:44 PM 09/01/2021  ?  6:21 AM  ?Last 3 Weights  ?Weight (lbs) 109 lb 12.6 oz 107 lb 107 lb  ?Weight (kg) 49.8 kg 48.535 kg 48.535 kg  ?   ? ?Telemetry  ?  ?An episode of SVT 150 otherwise her P waves are shour freq. - Personally Reviewed ? ?ECG  ?  ?No new - Personally Reviewed ? ?Physical Exam  ? ?GEN: No acute distress.   ?Neck: No JVD ?Cardiac: RRR, no murmurs, rubs, or gallops.  ?Respiratory: Clear to auscultation bilaterally. ?GI: Soft, nontender, non-distended  ?MS: No edema; No deformity. ?Neuro:  Nonfocal  ?Psych: Normal affect  ? ?Labs  ?  ?High Sensitivity Troponin:   ?Recent  Labs  ?Lab 09/01/21 ?2146 09/01/21 ?2350 09/02/21 ?0230 09/02/21 ?1447 09/03/21 ?0932  ?TROPONINIHS 18* 57* 117* 409* 441*  ?   ?Chemistry ?Recent Labs  ?Lab 08/30/21 ?1459 09/01/21 ?2146 09/03/21 ?0932 09/04/21 ?6759  ?NA 141 139 136 136  ?K 4.4 4.3 4.5 4.4  ?CL 106 104 105 104  ?CO2 24 25 23 26   ?GLUCOSE 94 164* 143* 129*  ?BUN 40* 43* 53* 61*  ?CREATININE 1.67* 1.95* 1.71* 1.71*  ?CALCIUM 9.2 9.0 8.9 8.9  ?PROT 8.5*  --   --   --   ?ALBUMIN 3.2*  --   --   --   ?AST 27  --   --   --   ?ALT 17  --   --   --   ?ALKPHOS 47  --   --   --   ?BILITOT 0.6  --   --   --   ?GFRNONAA 30* 25* 29* 29*  ?ANIONGAP 11 10 8 6   ?  ?Lipids No results for input(s): CHOL, TRIG, HDL, LABVLDL, LDLCALC, CHOLHDL in the last 168 hours.  ?Hematology ?Recent Labs  ?Lab 09/03/21 ?1638  09/04/21 ?8416 09/05/21 ?6063  ?WBC 5.3 4.6 4.9  ?RBC 3.12* 3.04* 3.28*  ?HGB 9.6* 9.4* 10.1*  ?HCT 29.6* 28.2* 30.6*  ?MCV 94.9 92.8 93.3  ?MCH 30.8 30.9 30.8  ?MCHC 32.4 33.3 33.0  ?RDW 12.6 12.7 12.6  ?PLT 94* 91* 103*  ? ?Thyroid No results for input(s): TSH, FREET4 in the last 168 hours.  ?BNPNo results for input(s): BNP, PROBNP in the last 168 hours.  ?DDimer No results for input(s): DDIMER in the last 168 hours.  ? ?Radiology  ?  ?CT SOFT TISSUE NECK WO CONTRAST ? ?Result Date: 09/04/2021 ?CLINICAL DATA:  Painful swallowing, dysphagia, large goiter EXAM: CT NECK WITHOUT CONTRAST TECHNIQUE: Multidetector CT imaging of the neck was performed following the standard protocol without intravenous contrast. RADIATION DOSE REDUCTION: This exam was performed according to the departmental dose-optimization program which includes automated exposure control, adjustment of the mA and/or kV according to patient size and/or use of iterative reconstruction technique. COMPARISON:  CT neck 09/16/2018 FINDINGS: Evaluation is degraded by noncontrast technique as well as motion and streak artifact through the neck. Pharynx and larynx: The nasal cavity and nasopharynx are  grossly unremarkable. The oral cavity and oropharynx are grossly unremarkable. The parapharyngeal fat appears preserved. The hypopharynx and larynx are grossly unremarkable, though the vocal folds are not well assessed. Salivary glands: The parotid glands are unremarkable. The submandibular glands are not well seen. Thyroid: The thyroid is markedly enlarged extending inferiorly into the upper mediastinum as well as superiorly into the left neck to the level of the angle of the mandible. The thyroid parenchyma appears heterogeneous with coarse internal calcifications. Overall, the size and appearance is grossly similar to the study from 2020. The thyroid exert mass effect on the trachea with slight rightward deviation of the level of the clavicular heads but no significant luminal narrowing. Lymph nodes: No pathologic lymphadenopathy is seen in the neck. Vascular: Calcified plaque is seen in the bilateral carotid bifurcations and aortic arch. Limited intracranial: The imaged portions of the intracranial compartment are grossly unremarkable. Visualized orbits: The globes and orbits are unremarkable. Mastoids and visualized paranasal sinuses: The paranasal sinuses and mastoid air cells are clear. Skeleton: There is advanced multilevel degenerative change of the cervical spine with grade 1 anterolisthesis of C4 on C5. Disc space narrowing and degenerative endplate irregularity is most advanced at C3-C4, C5-C6, C6-C7, and C7-T1, grossly similar to the prior CT from 2020. There is no evidence of acute osseous abnormality or aggressive osseous lesion. Upper chest: There is consolidative opacity in the left upper lobe. Opacity surrounding the solid nodule seen on the CT chest from 08/26/2021 have increased. No apical pneumothorax is seen. Other: None. IMPRESSION: 1. Image quality is suboptimal due to noncontrast technique as well as streak and motion artifact. 2. Within this confine, the massively enlarged thyroid is  grossly similar in size and appearance compared to the CT from 09/16/2018. The thyroid extends into the upper mediastinum as well as into the left neck to the level of the angle of the mandible. There is mild mass effect on the surrounding structures with slight rightward tracheal deviation of the level of the clavicular heads but no significant luminal narrowing. 3. Consolidative opacity in the left upper lobe in the location of a previously identified nodule seen on the CT chest of 08/26/2021. Opacity surrounding the nodule have increased since that study, which may reflect postprocedural hemorrhage. No apical pneumothorax is seen. Electronically Signed   By: Court Joy.D.  On: 09/04/2021 16:14  ? ?DG CHEST PORT 1 VIEW ? ?Result Date: 09/04/2021 ?CLINICAL DATA:  87 year old female with history of cough and shortness of breath. EXAM: PORTABLE CHEST 1 VIEW COMPARISON:  Chest x-ray 09/02/2021. FINDINGS: Fiducial markers in the left upper lobe. Ill-defined mass-like opacity adjacent to the fiducial markers corresponding to known left upper lobe pulmonary nodule, likely with a small amount of perilesional hemorrhage related to recent biopsy, similar to the recent prior study. Lungs are otherwise clear. No pleural effusions. No pneumothorax. No evidence of pulmonary edema. Heart size is normal. Upper mediastinal contours are within normal limits. Atherosclerotic calcifications are noted in the thoracic aorta. IMPRESSION: 1. Left upper lobe pulmonary nodule again noted, with ill-defined opacities surrounding the pulmonary nodules, new compared to pre biopsy study 08/30/2021, likely reflective of a small amount of perilesional hemorrhage. This appears similar to the most recent prior study. Electronically Signed   By: Vinnie Langton M.D.   On: 09/04/2021 11:33  ? ?ECHOCARDIOGRAM COMPLETE ? ?Result Date: 09/03/2021 ?   ECHOCARDIOGRAM REPORT   Patient Name:   MARILYNE HASELEY DAVIS Date of Exam: 09/03/2021 Medical Rec #:   [id number]          Height:       67.0 in Accession #:    [id number]         Weight:       109.8 lb Date of Birth:  Apr 15, 1935           BSA:          1.567 m? Patient Age:    87 years           BP:           169

## 2021-09-05 NOTE — Progress Notes (Addendum)
?PROGRESS NOTE ? ? ? ?Lisa [name]  NFA:[id number] DOB: 02/12/1935 DOA: 09/01/2021 ?PCP: Carrolyn Meiers, MD  ? ? ?Brief Narrative:  ? ?87 year old female with past medical history of chronic hepatitis C, COPD hypertension , tobacco abuse, CKD stage IV, smoldering myeloma (on observation only) presented hospital with chest pain and shortness of breath.  She did have bronchoscopy with bronchial biopsy by Dr. Dr. Roxan Hockey and postprocedure developed chest discomfort and shortness of breath.  Patient then came back to the ED.  In the ED, patient was noted to have blood pressure of  220/ 88.  Oxygen saturation was 90-92% on room air.  WBC 3.7, hemoglobin 10.6, platelets 94,000. serum creatinine 1.95.  Chest x-ray showed hyperinflation and postsurgical changes in the left upper lobe without any pneumothorax.  Troponins 18>>57>>118.  The patient was started on IV heparin and nicardipine drip and was considered for admission to the hospital.  Cardiology was consulted as well. ?  ?Assessment and Plan: ?Principal Problem: ?  Hypertensive urgency ?Active Problems: ?  COPD (chronic obstructive pulmonary disease) ([place]) ?  Anemia due to stage 4 chronic kidney disease ([place]) ?  Chest pain ?  CKD (chronic kidney disease) stage 4, GFR 15-29 ml/min (HCC) ?  Tobacco abuse ?  Myeloma ([place]) ?  Non-ST elevation (NSTEMI) myocardial infarction [facility]) ?  Odynophagia ?  Mass of upper lobe of left lung ?  ? ?Hypertensive urgency ?Initial blood pressure  up to 220/88 in ED. patient was initially on Cardene drip which has been discontinued.  Due to bradycardia, Cardizem has been discontinued and started on amlodipine.  Continue Lasix, Imdur.  Patient was on labetalol as outpatient which will be changed to Coreg twice a day as per cardiology.  Blood pressure has overall improved at this time. ? ?Left posterior apex 2.1 x 1.6 cm hypermetabolic mass concerning for primary lung malignancy.  Status post bronchoscopy and transbronchial  biopsy on 09/01/2021 by Dr. Koleen Nimrod CT surgery.  Chest x-ray done on 09/02/2021 showed a stable left upper lobe nodule without any evidence of vascular congestion/pneumothorax. Repeat chest x-ray on 09/04/2021 was performed due to persistent wheezing and feeling of dyspnea.  It again showed ill-defined opacity surrounding the pulmonary nodule. ? ?Smoldering myeloma ([place]) ?IgG Kappa smoldering myeloma.  Patient follows up with Dr. Delton Coombes, is currently on observation. ? ?Tobacco abuse ?Cessation was discussed by the previous provider.  On nicotine patch. ? ?CKD (chronic kidney disease) stage 4, GFR 15-29 ml/min (HCC) ?Baseline creatinine 1.6-1.9, patient follows up with Dr. Theador Hawthorne nephrology as outpatient.  Latest creatinine from tat 1.7. ? ?Atypical chest pain ?troponins 18>>57>>118.  Responsive to nitroglycerin.  Received 48 hours of IV heparin and has been discontinued at this time.  2D echocardiogram with LV ejection fraction of 70 to 75% with grade 2 diastolic dysfunction..  Patient is not a candidate for invasive procedures including catheterization due to advanced CKD.  Imdur has been added to the regimen.  EKG showed some mild diffuse ST depression and LVH.  Denies any chest pain today. ? ?Anemia due to stage 4 chronic kidney disease ([place]) ?Baseline Hgb 10.  We will continue to monitor.  No signs of bleeding.  Latest hemoglobin of 10.1. ? ?COPD (chronic obstructive pulmonary disease) ([place]) ?Continue DuoNebs and Pulmicort, supplemental oxygen.  We will continue to monitor closely.  VQ scan with low probability.  Still has wheezing and not sure whether it is related to upper airway issue.  Spoke with ENT Dr. Janace Hoard  today regarding the massive thyroid enlargement with possible compressive effects. ? ?Painful swallow.   after bronchoscopy intervention.  Follow barium swallow to assess.  speech and swallow evaluation was performed with recommendation for dysphagia 3 diet.  Patient with previous history of  primary esophageal dysphagia.  ENT to assess the patient due to grossly enlarged thyroid with possible compressive effects,.  No pain today.  Continue long-acting nitro.  Patient has esophageal dysmotility problem.  Talked about precautions on intake. ? ?Nodular thyroid enlargement.  As per patient patient has non toxic goiter.   CT scan of the soft tissue of the neck large thyroid with anterior mediastinal extension.  Spoke with ENT regarding this finding for further opinion ? ?Debility, deconditioning.  We will get PT OT evaluation ? ? DVT prophylaxis: heparin injection 5,000 Units Start: 09/04/21 1400   ? ? ?Code Status:   ?  Code Status: Full Code ? ?Disposition: Unknown at this time, likely home with home health in 1 to 2 days. ? ?Status is: Inpatient ? ?Remains inpatient appropriate because: COPD, dyspnea, supplemental oxygen, large thyroid mass, atypical chest pain  ? ? Family Communication:  ? ?I spoke with the patient's niece on the phone on 09/05/2021 and upated her about the clinical condition of the patient plan for disposition.  She would like to maximize home health services on discharge. ? ?Consultants:  ?Cardiology ?ENT ? ?Procedures:  ?None ? ?Antimicrobials:  ?None ? ?Anti-infectives (From admission, onward)  ? ? None  ? ?  ? ?Subjective: ?Today, patient was seen and examined at bedside.  Still complains of difficulty with breathing and wheezing.  Difficulty swallowing.  Denies any chest pain at this time.   ? ?Objective: ?Vitals:  ? 09/05/21 4403 09/05/21 4742 09/05/21 5956 09/05/21 0827  ?BP: (!) 156/62 (!) 156/61 (!) 147/91   ?Pulse:      ?Resp: (!) 22 (!) 24 19   ?Temp:   97.6 ?F (36.4 ?C)   ?TempSrc:   Oral   ?SpO2: 97% 97% 100% 98%  ?Weight:      ?Height:      ? ?No intake or output data in the 24 hours ending 09/05/21 0958 ? ?Filed Weights  ? 09/01/21 2044 09/03/21 0040  ?Weight: 48.5 kg 49.8 kg  ? ? ?Physical Examination: ? ?General:  Average built, not in obvious distress on nasal cannula  oxygen, mildly dyspneic ?HENT:   No scleral pallor or icterus noted. Oral mucosa is moist.  Large nodular goiter noted. ?Chest:    Diminished breath sounds bilaterally.  Expiratory wheezes noted. ?CVS: S1 &S2 heard. No murmur.  Regular rate and rhythm. ?Abdomen: Soft, nontender, nondistended.  Bowel sounds are heard.   ?Extremities: No cyanosis, clubbing or edema.  Peripheral pulses are palpable. ?Psych: Alert, awake and oriented, normal mood ?CNS:  No cranial nerve deficits.  Power equal in all extremities.   ?Skin: Warm and dry.  No rashes noted. ? ? ?Data Reviewed:  ? ?CBC: ?Recent Labs  ?Lab 08/30/21 ?1459 09/01/21 ?2146 09/03/21 ?0244 09/04/21 ?3875 09/05/21 ?6433  ?WBC 3.0* 3.7* 5.3 4.6 4.9  ?HGB 10.8* 10.6* 9.6* 9.4* 10.1*  ?HCT 33.8* 33.6* 29.6* 28.2* 30.6*  ?MCV 97.1 98.8 94.9 92.8 93.3  ?PLT 104* 94* 94* 91* 103*  ? ? ? ?Basic Metabolic Panel: ?Recent Labs  ?Lab 08/30/21 ?1459 09/01/21 ?2146 09/03/21 ?0932 09/04/21 ?2951  ?NA 141 139 136 136  ?K 4.4 4.3 4.5 4.4  ?CL 106 104 105 104  ?CO2 24 25  23 26  ?GLUCOSE 94 164* 143* 129*  ?BUN 40* 43* 53* 61*  ?CREATININE 1.67* 1.95* 1.71* 1.71*  ?CALCIUM 9.2 9.0 8.9 8.9  ? ? ? ?Liver Function Tests: ?Recent Labs  ?Lab 08/30/21 ?1459  ?AST 27  ?ALT 17  ?ALKPHOS 47  ?BILITOT 0.6  ?PROT 8.5*  ?ALBUMIN 3.2*  ? ? ? ? ?Radiology Studies: ?CT SOFT TISSUE NECK WO CONTRAST ? ?Result Date: 09/04/2021 ?CLINICAL DATA:  Painful swallowing, dysphagia, large goiter EXAM: CT NECK WITHOUT CONTRAST TECHNIQUE: Multidetector CT imaging of the neck was performed following the standard protocol without intravenous contrast. RADIATION DOSE REDUCTION: This exam was performed according to the departmental dose-optimization program which includes automated exposure control, adjustment of the mA and/or kV according to patient size and/or use of iterative reconstruction technique. COMPARISON:  CT neck 09/16/2018 FINDINGS: Evaluation is degraded by noncontrast technique as well as motion and  streak artifact through the neck. Pharynx and larynx: The nasal cavity and nasopharynx are grossly unremarkable. The oral cavity and oropharynx are grossly unremarkable. The parapharyngeal fat appears preserved.

## 2021-09-05 NOTE — TOC Initial Note (Signed)
Transition of Care (TOC) - Initial/Assessment Note  ? ? ?Patient Details  ?Name: Lisa [name] ?MRN: [id number] ?Date of Birth: 1935-06-06 ? ?Transition of Care (TOC) CM/SW Contact:    ?Graves-Bigelow, Ocie Cornfield, RN ?Phone Number: ?09/05/2021, 3:12 PM ? ?Clinical Narrative:  Case Manager spoke with the patient regarding disposition needs. Patient wants to return home with home health services. Patient has support of her family that alternates care throughout the day. Case Manager did discuss home health services with the patient and the niece. Medicare.gov list provided to the family and they [facility] services. [facility] can accept the patient for services and start of care to begin within 24-48 hours post transition home. Patient has durable medical equipment: cane, rolling walker, and wheelchair- no DME needs at this time.  Case Manager has asked MD for orders via secure chat. Case Manager will continue to follow for additional transition of care needs.            ? ? ?Expected Discharge Plan: [place] ?Barriers to Discharge: Continued Medical Work up ? ? ?Patient Goals and CMS Choice ?Patient states their goals for this hospitalization and ongoing recovery are:: to return home to be as independent as possible. ?CMS Medicare.gov Compare Post Acute Care list provided to:: Patient ?Choice offered to / list presented to : Patient ? ?Expected Discharge Plan and Services ?Expected Discharge Plan: [place] ?In-house Referral: NA ?Discharge Planning Services: CM Consult ?Post Acute Care Choice: Home Health ?Living arrangements for the past 2 months: [place] ?                ?DME Arranged: N/A ?DME Agency: NA ?  ?  ?  ?HH Arranged: Disease Management, RN, PT, OT, Nurse's Aide, Social Work ?[place] Agency: [place] ?Date HH Agency Contacted: 09/05/21 ?Time [place]: 1500 ?Representative spoke with at [place]: Tommi Rumps ? ?Prior Living  Arrangements/Services ?Living arrangements for the past 2 months: [place] ?Lives with:: Self (Patient has support of family that alternates care.) ?Patient language and need for interpreter reviewed:: Yes ?Do you feel safe going back to the place where you live?: Yes      ?Need for Family Participation in Patient Care: Yes (Comment) ?Care giver support system in place?: Yes (comment) ?Current home services: DME (Patient has wheelchair, rolling walker and cane.) ?Criminal Activity/Legal Involvement Pertinent to Current Situation/Hospitalization: No - Comment as needed ? ?Activities of Daily Living ?Home Assistive Devices/Equipment: Gilford Rile (specify type) (front) ?ADL Screening (condition at time of admission) ?Patient's cognitive ability adequate to safely complete daily activities?: Yes ?Is the patient deaf or have difficulty hearing?: No ?Does the patient have difficulty seeing, even when wearing glasses/contacts?: No ?Does the patient have difficulty concentrating, remembering, or making decisions?: Yes ?Patient able to express need for assistance with ADLs?: Yes ?Does the patient have difficulty dressing or bathing?: No ?Independently performs ADLs?: Yes (appropriate for developmental age) ?Does the patient have difficulty walking or climbing stairs?: No ?Weakness of Legs: None ?Weakness of Arms/Hands: None ? ?Permission Sought/Granted ?Permission sought to share information with : Family Supports, Customer service manager, Case Manager ?Permission granted to share information with : Yes, Verbal Permission Granted ?   ? Permission granted to share info w AGENCY: [facility] ?   ?   ? ?Emotional Assessment ?Appearance:: Appears stated age ?Attitude/Demeanor/Rapport: Engaged ?Affect (typically observed): Appropriate ?Orientation: : Oriented to Situation, Oriented to  Time, Oriented to Place, Oriented to Self ?Alcohol / Substance Use: Not Applicable ?Psych Involvement: No (comment) ? ?Admission  diagnosis:  NSTEMI (non-ST elevated myocardial infarction) ([place]) [I21.4] ?Hypertensive urgency [I16.0] ?Patient Active Problem List  ? Diagnosis Date Noted  ? Enlarged thyroid gland 09/05/2021  ? Odynophagia 09/03/2021  ? Mass of upper lobe of left lung 09/03/2021  ? Hypertensive urgency 09/02/2021  ? Chest pain 09/02/2021  ? CKD (chronic kidney disease) stage 4, GFR 15-29 ml/min (HCC) 09/02/2021  ? Tobacco abuse 09/02/2021  ? Myeloma ([place]) 09/02/2021  ? Non-ST elevation (NSTEMI) myocardial infarction [facility])   ? Demand ischemia ([place])   ? Iron deficiency anemia 07/29/2021  ? Anemia due to stage 4 chronic kidney disease ([place]) 07/22/2021  ? Hepatitis C virus infection without hepatic coma 07/22/2021  ? MGUS (monoclonal gammopathy of unknown significance) IgG Kappa 06/21/2021  ? Abnormal LFTs (liver function tests)   ? Calculus of gallbladder without cholecystitis without obstruction   ? Gallstone pancreatitis 09/13/2018  ? Protein-calorie malnutrition, severe ([place]) 05/09/2014  ? Sepsis ([place]) 05/07/2014  ? Encephalopathy 05/07/2014  ? Back pain 05/07/2014  ? Acute renal failure syndrome (HCC)   ? Bacteremia   ? COLD (chronic obstructive lung disease) ([place])   ? Viridans streptococci infection   ? H/O goiter 04/10/2014  ? Moderate protein-calorie malnutrition ([place]) 04/10/2014  ? Acute encephalopathy 04/09/2014  ? Streptococcus viridans infection 04/08/2014  ? Leukocytosis 04/07/2014  ? Dehydration 04/07/2014  ? Fever 04/06/2014  ? UTI (lower urinary tract infection) 04/06/2014  ? ARF (acute renal failure) ([place]) 04/06/2014  ? Pyelonephritis, acute 04/06/2014  ? Hypertension   ? COPD (chronic obstructive pulmonary disease) ([place])   ? ?PCP:  Carrolyn Meiers, MD ?Pharmacy:   ?[id number] [place], [place] ?[place] ?[place] [address] ?Phone: [phone] Fax: [phone] ? ?[place], [place] ?[place]  [address] ?Phone: [phone] Fax: [phone] ? ? ?Readmission Risk Interventions ?   ? View : No data to display.  ?  ?  ?  ? ? ? ?

## 2021-09-05 NOTE — Evaluation (Signed)
Physical Therapy Evaluation ?Patient Details ?Name: Lisa [name] ?MRN: [id number] ?DOB: 10/03/1934 ?Today's Date: 09/05/2021 ? ?History of Present Illness ? Patient is a 87 y/o female who presents on 3/23 with SOB and chest pain. Recently d/ced same day after lung biopsy. Admitted with HTN urgency. PMH includes HTN, COPD, CKD, Hep C, Monoclonal gammopathy of unknown significance.  ?Clinical Impression ? Patient presents with generalized weakness, incoordination, dyspnea on exertion, decreased endurance, impaired activity tolerance and impaired mobility s/p above. Pt lives alone but has support from family in/out throughout the day. Today, pt requires mod A for gait training with use of RW due to ataxia, weakness and impaired balance. Requires seated rest break due to 3/4 DOE. Not able to get a good pleth for Sp02 reading during activity. Pt is not safe to be up mobilizing alone at this time due to being high fall risk. Recommend family provide hands on assist initially for a few days; communicated this with Dewaine Oats present today. Recommend maximizing HH services- HHPT/OT/aide etc. CM made aware. Will follow acutely to maximize independence and mobility prior to return home.  ?   ? ?Recommendations for follow up therapy are one component of a multi-disciplinary discharge planning process, led by the attending physician.  Recommendations may be updated based on patient status, additional functional criteria and insurance authorization. ? ?Follow Up Recommendations Home health PT (maximize [facility]) ? ?  ?Assistance Recommended at Discharge Frequent or constant Supervision/Assistance  ?Patient can return home with the following ? A lot of help with walking and/or transfers;Assist for transportation;Assistance with cooking/housework;Help with stairs or ramp for entrance ? ?  ?Equipment Recommendations None recommended by PT  ?Recommendations for Other Services ?    ?  ?Functional Status Assessment Patient has had a  recent decline in their functional status and demonstrates the ability to make significant improvements in function in a reasonable and predictable amount of time.  ? ?  ?Precautions / Restrictions Precautions ?Precautions: Fall ?Restrictions ?Weight Bearing Restrictions: No  ? ?  ? ?Mobility ? Bed Mobility ?Overal bed mobility: Needs Assistance ?Bed Mobility: Supine to Sit ?  ?  ?Supine to sit: Min guard, HOB elevated ?  ?  ?General bed mobility comments: Min guard for safety, use of rail for support. ?  ? ?Transfers ?Overall transfer level: Needs assistance ?Equipment used: Rolling walker (2 wheels) ?Transfers: Sit to/from Stand ?Sit to Stand: Min assist ?  ?  ?  ?  ?  ?General transfer comment: Min A to stand from EOB x1, from chair x1, transferred to recliner x1. ?  ? ?Ambulation/Gait ?Ambulation/Gait assistance: Mod assist ?Gait Distance (Feet): 35 Feet (+25') ?Assistive device: Rolling walker (2 wheels) ?Gait Pattern/deviations: Step-through pattern, Decreased stride length, Ataxic, Knees buckling ?  ?Gait velocity interpretation: 1.31 - 2.62 ft/sec, indicative of limited community ambulator ?  ?General Gait Details: Fast, ataxic gait with right knee buckling. Unsteady needing Mod A throughout and cues for RW proximity/management. 3/4 DOE. Needs seated rest break due to weakness/SOB. ? ?Stairs ?  ?  ?  ?  ?  ? ?Wheelchair Mobility ?  ? ?Modified Rankin (Stroke Patients Only) ?  ? ?  ? ?Balance Overall balance assessment: Needs assistance ?Sitting-balance support: Feet supported, No upper extremity supported ?Sitting balance-Leahy Scale: Fair ?  ?  ?Standing balance support: During functional activity, Reliant on assistive device for balance ?Standing balance-Leahy Scale: Poor ?Standing balance comment: Min guard assist for static standing and mod A for walking ?  ?  ?  ?  ?  ?  ?  ?  ?  ?  ?  ?   ? ? ? ?  Pertinent Vitals/Pain Pain Assessment ?Pain Assessment: No/denies pain  ? ? ?Home Living Family/patient  expects to be discharged to:: Private residence ?Living Arrangements: Alone (niece and nephew check on pt) ?Available Help at Discharge: Family;Available PRN/intermittently ?Type of Home: House ?Home Access: Stairs to enter ?Entrance Stairs-Rails: Can reach both ?Entrance Stairs-Number of Steps: 2 ?Alternate Level Stairs-Number of Steps: can stay on main level ?Home Layout: Two level;Able to live on main level with bedroom/bathroom ?Home Equipment: Conservation officer, nature (2 wheels);Rollator (4 wheels);BSC/3in1;Shower seat ?   ?  ?Prior Function Prior Level of Function : Independent/Modified Independent ?  ?  ?  ?  ?  ?  ?Mobility Comments: Uses rollator for ambulation ?ADLs Comments: Able to dress self/bathe self, does some light cooking but no cleaning. Reports no falls. ?  ? ? ?Hand Dominance  ? Dominant Hand: Right ? ?  ?Extremity/Trunk Assessment  ? Upper Extremity Assessment ?Upper Extremity Assessment: Defer to OT evaluation ?  ? ?Lower Extremity Assessment ?Lower Extremity Assessment: Generalized weakness;RLE deficits/detail;LLE deficits/detail ?RLE Coordination: decreased fine motor;decreased gross motor ?LLE Coordination: decreased fine motor;decreased gross motor ?  ? ?Cervical / Trunk Assessment ?Cervical / Trunk Assessment: Kyphotic  ?Communication  ? Communication: No difficulties  ?Cognition Arousal/Alertness: Awake/alert ?Behavior During Therapy: [facility] for tasks assessed/performed ?Overall Cognitive Status: Impaired/Different from baseline ?Area of Impairment: Safety/judgement ?  ?  ?  ?  ?  ?  ?  ?  ?  ?  ?  ?  ?Safety/Judgement: Decreased awareness of safety, Decreased awareness of deficits ?  ?  ?General Comments: Poor awareness of safety/deficits. ?  ?  ? ?  ?General Comments General comments (skin integrity, edema, etc.): Niece present during session. Did not get a good Sp02 reading due to poor pleth but otherwise Sp02 in high 90s when visible. ? ?  ?Exercises    ? ?Assessment/Plan  ?  ?PT Assessment  Patient needs continued PT services  ?PT Problem List Decreased strength;Decreased mobility;Decreased safety awareness;Decreased balance;Decreased activity tolerance;Cardiopulmonary status limiting activity;Decreased coordination ? ?   ?  ?PT Treatment Interventions Therapeutic exercise;Gait training;Patient/family education;Therapeutic activities;Functional mobility training;Stair training;Neuromuscular re-education;Balance training   ? ?PT Goals (Current goals can be found in the Care Plan section)  ?Acute Rehab PT Goals ?Patient Stated Goal: to go home now; per family to keep pt at home and get some help ?PT Goal Formulation: With patient/family ?Time For Goal Achievement: 09/19/21 ?Potential to Achieve Goals: Fair ? ?  ?Frequency Min 3X/week ?  ? ? ?Co-evaluation   ?  ?  ?  ?  ? ? ?  ?AM-PAC PT "6 Clicks" Mobility  ?Outcome Measure Help needed turning from your back to your side while in a flat bed without using bedrails?: A Little ?Help needed moving from lying on your back to sitting on the side of a flat bed without using bedrails?: A Little ?Help needed moving to and from a bed to a chair (including a wheelchair)?: A Little ?Help needed standing up from a chair using your arms (e.g., wheelchair or bedside chair)?: A Little ?Help needed to walk in hospital room?: A Lot ?Help needed climbing 3-5 steps with a railing? : A Lot ?6 Click Score: 16 ? ?  ?End of Session Equipment Utilized During Treatment: Gait belt ?Activity Tolerance: Patient limited by fatigue;Other (comment) (SOB) ?Patient left: in chair;with call bell/phone within reach;with family/visitor present ?Nurse Communication: Mobility status ?PT Visit Diagnosis: Muscle weakness (generalized) (M62.81);Difficulty in walking, not elsewhere classified (R26.2);Unsteadiness on feet (R26.81) ?  ? ?  Time: 3403-7096 ?PT Time Calculation (min) (ACUTE ONLY): 26 min ? ? ?Charges:   PT Evaluation ?$PT Eval Moderate Complexity: 1 Mod ?PT Treatments ?$Gait Training:  8-22 mins ?  ?   ? ? ?Marisa Severin, PT, DPT ?Acute Rehabilitation Services ?Pager [phone] ?Office [phone] ? ? ? ? ?[place] ?09/05/2021, 4:08 PM ? ?

## 2021-09-05 NOTE — Care Management Important Message (Signed)
Important Message ? ?Patient Details  ?Name: Lisa [name] ?MRN: [id number] ?Date of Birth: [birth year] ? ? ?Medicare Important Message Given:  Yes ? ? ? ? ?Levada Dy  Shaunika Italiano-Martin ?09/05/2021, 3:22 PM ?

## 2021-09-05 NOTE — Consult Note (Signed)
Reason for Consult: Thyroid goiter ?Referring Physician: Dr Louanne Belton ? ?Lisa [name] is an 87 y.o. female.  ?HPI: Patient with a history of a thyroid goiter for many years.  She says she has not seen an ENT in 10 years.  She had a CT scan in 2020 that was compared to the current CT of her neck.  There is no change in the thyroid goiters appearance.  No compression.  She started having shortness of breath after a lung biopsy.  She states that her lungs are too weak to have any operation if this turns out to be cancer.  She has significant COPD.  She actually feels better today relative to the shortness of breath.  She denies any stridor.  She still able to get food and water down but sometimes it feels slightly difficult.  Sometimes she also has some discomfort. ? ?Past Medical History:  ?Diagnosis Date  ? Anemia   ? Carotid artery stenosis   ? CKD (chronic kidney disease)   ? COPD (chronic obstructive pulmonary disease) ([place])   ? Dyspnea   ? Due to COPD per patient  ? Goiter diffuse, nontoxic   ? Per patient  ? Hepatitis C   ? Hypertension   ? Monoclonal gammopathy of unknown significance (MGUS)   ? Pyelonephritis 04/06/2014  ? UTI  ? ? ?Past Surgical History:  ?Procedure Laterality Date  ? ABDOMINAL HYSTERECTOMY    ? CHOLECYSTECTOMY N/A 09/17/2018  ? Procedure: LAPAROSCOPIC CHOLECYSTECTOMY;  Surgeon: Virl Cagey, MD;  Location: [facility];  Service: General;  Laterality: N/A;  ? FUDUCIAL PLACEMENT Left 09/01/2021  ? Procedure: PLACEMENT OF FIDUCIAL MARKERS TIMES FOUR;  Surgeon: Melrose Nakayama, MD;  Location: [place];  Service: Thoracic;  Laterality: Left;  ? TEE WITHOUT CARDIOVERSION N/A 04/10/2014  ? Procedure: TRANSESOPHAGEAL ECHOCARDIOGRAM (TEE);  Surgeon: [name], MD;  Location: [facility];  Service: Cardiovascular;  Laterality: N/A;  ? VIDEO BRONCHOSCOPY WITH ENDOBRONCHIAL NAVIGATION N/A 09/01/2021  ? Procedure: VIDEO BRONCHOSCOPY WITH ENDOBRONCHIAL NAVIGATION;  Surgeon: Melrose Nakayama,  MD;  Location: [place];  Service: Thoracic;  Laterality: N/A;  ? ? ?Family History  ?Problem Relation Age of Onset  ? Hypertension Mother   ? Thyroid disease Mother   ? Hypertension Father   ? Thyroid disease Sister   ? Thyroid disease Brother   ? Thyroid disease Sister   ? ? ?Social History:  reports that she has been smoking cigarettes. She has been smoking an average of .25 packs per day. She has never used smokeless tobacco. She reports that she does not drink alcohol and does not use drugs. ? ?Allergies: No Known Allergies ? ?Medications: I have reviewed the patient's current medications. ? ?Results for orders placed or performed during the hospital encounter of 09/01/21 (from the past 48 hour(s))  ?Heparin level (unfractionated)     Status: None  ? Collection Time: 09/03/21 12:20 PM  ?Result Value Ref Range  ? Heparin Unfractionated 0.49 0.30 - 0.70 IU/mL  ?  Comment: (NOTE) ?The clinical reportable range upper limit is being lowered to >1.10 ?to align with the FDA approved guidance for the current laboratory ?assay. ? ?If heparin results are below expected values, and patient dosage has  ?been confirmed, suggest follow up testing of antithrombin III levels. ?Performed at [facility], [place] [address], [place], [place] ?[address] ?  ?CBC     Status: Abnormal  ? Collection Time: 09/04/21  3:32 AM  ?Result Value  Ref Range  ? WBC 4.6 4.0 - 10.5 K/uL  ? RBC 3.04 (L) 3.87 - 5.11 MIL/uL  ? Hemoglobin 9.4 (L) 12.0 - 15.0 g/dL  ? HCT 28.2 (L) 36.0 - 46.0 %  ? MCV 92.8 80.0 - 100.0 fL  ? MCH 30.9 26.0 - 34.0 pg  ? MCHC 33.3 30.0 - 36.0 g/dL  ? RDW 12.7 11.5 - 15.5 %  ? Platelets 91 (L) 150 - 400 K/uL  ?  Comment: Immature Platelet Fraction may be ?clinically indicated, consider ?ordering this additional test ?[id number] ?REPEATED TO VERIFY ?PLATELET COUNT CONFIRMED BY SMEAR ?  ? nRBC 0.0 0.0 - 0.2 %  ?  Comment: Performed at [facility], [place] [address] [place], [place] [address]  ?Heparin level  (unfractionated)     Status: None  ? Collection Time: 09/04/21  3:32 AM  ?Result Value Ref Range  ? Heparin Unfractionated 0.48 0.30 - 0.70 IU/mL  ?  Comment: (NOTE) ?The clinical reportable range upper limit is being lowered to >1.10 ?to align with the FDA approved guidance for the current laboratory ?assay. ? ?If heparin results are below expected values, and patient dosage has  ?been confirmed, suggest follow up testing of antithrombin III levels. ?Performed at [facility], [place] [address] [place], [place] ?[address] ?  ?Basic metabolic panel     Status: Abnormal  ? Collection Time: 09/04/21  3:32 AM  ?Result Value Ref Range  ? Sodium 136 135 - 145 mmol/L  ? Potassium 4.4 3.5 - 5.1 mmol/L  ? Chloride 104 98 - 111 mmol/L  ? CO2 26 22 - 32 mmol/L  ? Glucose, Bld 129 (H) 70 - 99 mg/dL  ?  Comment: Glucose reference range applies only to samples taken after fasting for at least 8 hours.  ? BUN 61 (H) 8 - 23 mg/dL  ? Creatinine, Ser 1.71 (H) 0.44 - 1.00 mg/dL  ? Calcium 8.9 8.9 - 10.3 mg/dL  ? GFR, Estimated 29 (L) >60 mL/min  ?  Comment: (NOTE) ?Calculated using the CKD-EPI Creatinine Equation (2021) ?  ? Anion gap 6 5 - 15  ?  Comment: Performed at [facility], [place] [address], [place], [place] [address]  ?ABO/Rh     Status: None  ? Collection Time: 09/04/21  3:32 AM  ?Result Value Ref Range  ? ABO/RH(D)    ?  O POS ?Performed at [facility], [place] [address], [place], [place] [address] ?  ?CBC     Status: Abnormal  ? Collection Time: 09/05/21  2:33 AM  ?Result Value Ref Range  ? WBC 4.9 4.0 - 10.5 K/uL  ? RBC 3.28 (L) 3.87 - 5.11 MIL/uL  ? Hemoglobin 10.1 (L) 12.0 - 15.0 g/dL  ? HCT 30.6 (L) 36.0 - 46.0 %  ? MCV 93.3 80.0 - 100.0 fL  ? MCH 30.8 26.0 - 34.0 pg  ? MCHC 33.0 30.0 - 36.0 g/dL  ? RDW 12.6 11.5 - 15.5 %  ? Platelets 103 (L) 150 - 400 K/uL  ?  Comment: Immature Platelet Fraction may be ?clinically indicated, consider ?ordering this additional test ?[id number] ?CONSISTENT WITH PREVIOUS  RESULT ?REPEATED TO VERIFY ?  ? nRBC 0.0 0.0 - 0.2 %  ?  Comment: Performed at [facility], [place] [address] [place],  [address]  ? ? ?CT SOFT TISSUE NECK WO CONTRAST ? ?Result Date: 09/04/2021 ?CLINICAL DATA:  Painful swallowing, dysphagia, large goiter EXAM: CT  NECK WITHOUT CONTRAST TECHNIQUE: Multidetector CT imaging of the neck was performed following the standard protocol without intravenous contrast. RADIATION DOSE REDUCTION: This exam was performed according to the departmental dose-optimization program which includes automated exposure control, adjustment of the mA and/or kV according to patient size and/or use of iterative reconstruction technique. COMPARISON:  CT neck 09/16/2018 FINDINGS: Evaluation is degraded by noncontrast technique as well as motion and streak artifact through the neck. Pharynx and larynx: The nasal cavity and nasopharynx are grossly unremarkable. The oral cavity and oropharynx are grossly unremarkable. The parapharyngeal fat appears preserved. The hypopharynx and larynx are grossly unremarkable, though the vocal folds are not well assessed. Salivary glands: The parotid glands are unremarkable. The submandibular glands are not well seen. Thyroid: The thyroid is markedly enlarged extending inferiorly into the upper mediastinum as well as superiorly into the left neck to the level of the angle of the mandible. The thyroid parenchyma appears heterogeneous with coarse internal calcifications. Overall, the size and appearance is grossly similar to the study from 2020. The thyroid exert mass effect on the trachea with slight rightward deviation of the level of the clavicular heads but no significant luminal narrowing. Lymph nodes: No pathologic lymphadenopathy is seen in the neck. Vascular: Calcified plaque is seen in the bilateral carotid bifurcations and aortic arch. Limited intracranial: The imaged portions of the intracranial compartment are grossly unremarkable. Visualized  orbits: The globes and orbits are unremarkable. Mastoids and visualized paranasal sinuses: The paranasal sinuses and mastoid air cells are clear. Skeleton: There is advanced multilevel degenerative change of the cervical

## 2021-09-06 ENCOUNTER — Ambulatory Visit (HOSPITAL_COMMUNITY): Payer: Medicare Other | Admitting: Hematology

## 2021-09-06 LAB — CBC
HCT: 26.3 % — ABNORMAL LOW (ref 36.0–46.0)
Hemoglobin: 8.6 g/dL — ABNORMAL LOW (ref 12.0–15.0)
MCH: 30.5 pg (ref 26.0–34.0)
MCHC: 32.7 g/dL (ref 30.0–36.0)
MCV: 93.3 fL (ref 80.0–100.0)
Platelets: 92 10*3/uL — ABNORMAL LOW (ref 150–400)
RBC: 2.82 MIL/uL — ABNORMAL LOW (ref 3.87–5.11)
RDW: 12.5 % (ref 11.5–15.5)
WBC: 3.8 10*3/uL — ABNORMAL LOW (ref 4.0–10.5)
nRBC: 0 % (ref 0.0–0.2)

## 2021-09-06 LAB — BASIC METABOLIC PANEL
Anion gap: 6 (ref 5–15)
BUN: 86 mg/dL — ABNORMAL HIGH (ref 8–23)
CO2: 29 mmol/L (ref 22–32)
Calcium: 8.8 mg/dL — ABNORMAL LOW (ref 8.9–10.3)
Chloride: 102 mmol/L (ref 98–111)
Creatinine, Ser: 1.89 mg/dL — ABNORMAL HIGH (ref 0.44–1.00)
GFR, Estimated: 26 mL/min — ABNORMAL LOW (ref >60)
Glucose, Bld: 101 mg/dL — ABNORMAL HIGH (ref 70–99)
Potassium: 4.2 mmol/L (ref 3.5–5.1)
Sodium: 137 mmol/L (ref 135–145)

## 2021-09-06 LAB — MAGNESIUM: Magnesium: 2.3 mg/dL (ref 1.7–2.4)

## 2021-09-06 MED ORDER — ISOSORBIDE MONONITRATE ER 30 MG PO TB24: 30.0000 mg | ORAL_TABLET | Freq: Every day | ORAL | 2 refills | Status: DC

## 2021-09-06 MED ORDER — NICOTINE 21 MG/24HR TD PT24: 21.0000 mg | MEDICATED_PATCH | Freq: Every day | TRANSDERMAL | 0 refills | Status: DC

## 2021-09-06 MED ORDER — CARVEDILOL 25 MG PO TABS: 25.0000 mg | ORAL_TABLET | Freq: Two times a day (BID) | ORAL | 2 refills | Status: DC

## 2021-09-06 MED ORDER — ONDANSETRON HCL 4 MG PO TABS: 4.0000 mg | ORAL_TABLET | Freq: Four times a day (QID) | ORAL | 0 refills | Status: DC | PRN

## 2021-09-06 MED ORDER — NITROGLYCERIN 0.4 MG SL SUBL: 0.4000 mg | SUBLINGUAL_TABLET | SUBLINGUAL | 1 refills | Status: DC | PRN

## 2021-09-06 MED ORDER — AMLODIPINE BESYLATE 10 MG PO TABS: 10.0000 mg | ORAL_TABLET | Freq: Every day | ORAL | 2 refills | Status: DC

## 2021-09-06 MED ORDER — PREDNISONE 10 MG PO TABS: 30.0000 mg | ORAL_TABLET | Freq: Every day | ORAL | 0 refills | Status: AC

## 2021-09-06 NOTE — Discharge Summary (Signed)
?Physician Discharge Summary ?  ?Patient: Lisa [name] MRN: [id number] DOB: Nov 17, 1934  ?Admit date:     09/01/2021  ?Discharge date: 09/06/21  ?Discharge Physician: Corrie Mckusick Amram Maya  ? ?PCP: Carrolyn Meiers, MD  ? ?Recommendations at discharge:  ? ?Follow-up with your primary care physician in 1 week.  New antihypertensives have been initiated.  Please consider adjusting as outpatient. ?Follow-up with cardiology as scheduled by the clinic. ?Has gross thyroid nodule.  Would benefit from follow-up as outpatient ?Follow-up with oncology as scheduled by you/clinic. ? ?Discharge Diagnoses: ?Principal Problem: ?  Hypertensive urgency ?Active Problems: ?  COPD (chronic obstructive pulmonary disease) ([place]) ?  Anemia due to stage 4 chronic kidney disease ([place]) ?  Chest pain ?  CKD (chronic kidney disease) stage 4, GFR 15-29 ml/min (H[facility]) ?  Tobacco abuse ?  Myeloma ([place]) ?  Non-ST elevation (NSTEMI) myocardial infarction [facility]) ?  Odynophagia ?  Mass of upper lobe of left lung ?  Enlarged thyroid gland ? ?Resolved Problems: ?  * No resolved hospital problems. * ? ?Hospital Course: ? ?87 year old female with past medical history of chronic hepatitis C, COPD hypertension , tobacco abuse, CKD stage IV, smoldering myeloma (on observation only) presented hospital with chest pain and shortness of breath.  She did have bronchoscopy with bronchial biopsy by Dr. Dr. Roxan Hockey and postprocedure developed chest discomfort and shortness of breath.  Patient then came back to the ED.  In the ED, patient was noted to have blood pressure of  220/ 88.  Oxygen saturation was 90-92% on room air.  WBC 3.7, hemoglobin 10.6, platelets 94,000. serum creatinine 1.95.  Chest x-ray showed hyperinflation and postsurgical changes in the left upper lobe without any pneumothorax.  Troponins 18>>57>>118.  The patient was started on IV heparin and nicardipine drip and was considered for admission to the hospital.  Cardiology was consulted as  well. ? ?Assessment and Plan: ? ?Hypertensive urgency ?Initial blood pressure  up to 220/88 in ED. patient was initially on Cardene drip which has been discontinued.  Due to bradycardia, Cardizem has been discontinued and started on amlodipine.  Continue Lasix, Imdur Coreg and amlodipine on discharge.  Blood pressure has overall improved but will need better adjustment as outpatient.   ?  ?Left posterior apex 2.1 x 1.6 cm hypermetabolic mass concerning for primary lung malignancy.  Status post bronchoscopy and transbronchial biopsy on 09/01/2021 by Dr. Koleen Nimrod CT surgery.  Chest x-ray done on 09/02/2021 showed a stable left upper lobe nodule without any evidence of vascular congestion/pneumothorax. Repeat chest x-ray on 09/04/2021 was performed due to persistent wheezing and feeling of dyspnea.  It again showed ill-defined opacity surrounding the pulmonary nodule.  At this time patient's breathing has improved.  Will need to follow-up with Dr. Roxan Hockey as outpatient ?  ?Smoldering myeloma ([place]) ?IgG Kappa smoldering myeloma.  Patient follows up with Dr. Delton Coombes, is currently on observation. ?  ?Tobacco abuse ?Cessation was discussed by the previous provider.  Received a nicotine patch while in the hospital and will continue on discharge. ?  ?CKD (chronic kidney disease) stage 4, GFR 15-29 ml/min (HCC) ?Baseline creatinine 1.6-1.9, patient follows up with Dr. Theador Hawthorne nephrology as outpatient.  Latest creatinine from tat 1.8. ?  ?Atypical chest pain ?troponins 18>>57>>118.  Responsive to nitroglycerin.  Received 48 hours of IV heparin and has been discontinued at this time.  2D echocardiogram with LV ejection fraction of 70 to 75% with grade 2 diastolic dysfunction..  Patient is not a  candidate for invasive procedures including catheterization due to advanced CKD.  Imdur has been added to the regimen.  EKG showed some mild diffuse ST depression and LVH.  No chest pain at this time.  Antihypertensives have been  adjusted. ?  ?Anemia due to stage 4 chronic kidney disease ([place]) ?Baseline Hgb 10.  We will continue to monitor.  No signs of bleeding.  Latest hemoglobin of 8.6.  We will need to follow-up with nephrology as outpatient ?  ?COPD (chronic obstructive pulmonary disease) ([place]) ?Continue nebulizers from home.    VQ scan with low probability.  Seen by  ENT Dr. Janace Hoard due to large thyroid nodule and concern for for airway compromise.  At this time no significant compromise reported.  Patient was advised to follow-up with her primary care physician as outpatient  ? ?Odynophagia.   After bronchoscopy intervention.  Speech and swallow evaluation was performed with recommendation for dysphagia 3 diet.  Patient was advised to soft diet on discharge.  Barium swallow was also performed which shows esophageal dysmotility.  Precautions were explained to the patient. ?  ?Nodular thyroid enlargement.  As per patient patient has non toxic goiter.   CT scan of the soft tissue of the neck large thyroid with anterior mediastinal extension.  Seen by ENT  during this admission. ? ?Debility, deconditioning.  Seen by PT OT and recommend home health on discharge. ?  ? ?Consultants:  ?Cardiology ?ENT ? ?Procedures performed:  ?Barium swallow ? ?Disposition: Home health ? ?Diet recommendation:  ?Discharge Diet Orders (From admission, onward)  ? ?  Start     Ordered  ? 09/06/21 0000  Diet - low sodium heart healthy       ?Comments: Mechanical soft diet.  ? 09/06/21 0915  ? ?  ?  ? ?  ? ?Cardiac diet, mechanical soft diet. ?DISCHARGE MEDICATION: ?Allergies as of 09/06/2021   ?No Known Allergies ?  ? ?  ?Medication List  ?  ? ?STOP taking these medications   ? ?diltiazem 120 MG 24 hr capsule ?Commonly known as: CARDIZEM CD ?  ?labetalol 300 MG tablet ?Commonly known as: NORMODYNE ?  ?megestrol 20 MG tablet ?Commonly known as: MEGACE ?  ? ?  ? ?TAKE these medications   ? ?amLODipine 10 MG tablet ?Commonly known as: NORVASC ?Take 1 tablet (10 mg  total) by mouth daily. ?  ?aspirin 81 MG EC tablet ?Take 81 mg by mouth in the morning. ?  ?calcium carbonate 1250 (500 Ca) MG tablet ?Commonly known as: OS-CAL - dosed in mg of elemental calcium ?Take 1 tablet by mouth daily with breakfast. ?  ?carvedilol 25 MG tablet ?Commonly known as: COREG ?Take 1 tablet (25 mg total) by mouth 2 (two) times daily with a meal. ?  ?feeding supplement (ENSURE COMPLETE) Liqd ?Take 237 mLs by mouth 2 (two) times daily between meals. ?  ?furosemide 20 MG tablet ?Commonly known as: LASIX ?Take 20 mg by mouth 2 (two) times daily. Morning & afternoon ?  ?Ipratropium-Albuterol 20-100 MCG/ACT Aers respimat ?Commonly known as: COMBIVENT ?Inhale 1 puff into the lungs every 6 (six) hours as needed for wheezing or shortness of breath. ?  ?ipratropium-albuterol 0.5-2.5 (3) MG/3ML Soln ?Commonly known as: DUONEB ?Take 3 mLs by nebulization every 4 (four) hours as needed (wheezing/shortness of breath). ?  ?isosorbide mononitrate 30 MG 24 hr tablet ?Commonly known as: IMDUR ?Take 1 tablet (30 mg total) by mouth daily. ?  ?nicotine 21 mg/24hr patch ?Commonly known as:  NICODERM CQ - dosed in mg/24 hours ?Place 1 patch (21 mg total) onto the skin daily. ?  ?nitroGLYCERIN 0.4 MG SL tablet ?Commonly known as: NITROSTAT ?Place 1 tablet (0.4 mg total) under the tongue every 5 (five) minutes as needed for chest pain. ?  ?ondansetron 4 MG tablet ?Commonly known as: ZOFRAN ?Take 1 tablet (4 mg total) by mouth every 6 (six) hours as needed for nausea. ?  ?predniSONE 10 MG tablet ?Commonly known as: DELTASONE ?Take 3 tablets (30 mg total) by mouth daily with breakfast for 3 days. ?  ?Vitamin D3 125 MCG (5000 UT) Caps ?Take 5,000 Units by mouth daily. ?  ? ?  ? ? Follow-up Information   ? ? Care, [facility] Follow up.   ?Specialty: Home Health Services ?Why: Registered Nurse, Physical Therapy, Ocupational Therapy, Aide, Social Worker-office to call with visit times. ?Contact information: ?[place] ?[address] ?[place] [address] ?[phone] ? ? ?  ?  ? ? Carrolyn Meiers, MD Follow up in 1 week(s).   ?Specialty: Internal Medicine ?Contact information: ?[place] ?[place]

## 2021-09-06 NOTE — Progress Notes (Signed)
Physical Therapy Treatment ?Patient Details ?Name: Lisa [name] ?MRN: [id number] ?DOB: 05-11-1935 ?Today's Date: 09/06/2021 ? ? ?History of Present Illness 87 y/o female admitted 3/23 with SOB and chest pain. Recently d/ced same day after lung biopsy. Admitted with HTN urgency. PMHx: HTN, COPD, CKD, Hep C, Monoclonal gammopathy of unknown significance. ? ?  ?PT Comments  ? ? Pt pleasant and reporting sleeping wrong with pain in RLE upon waking. Pt reporting sharp pain in right thigh with knee extension in sitting and standing unable to tolerate significant weight bearing on RLE this session and limited to stand pivot with assist to chair. Pt states family is planning on additional assist at home but given current function will require around the clock assist and not intermittent as family has previously provided. Pt aware of need for assist and RN aware of change in status. Will continue to follow.  ?  ?HR 59-62 ?SPO2 94-100% RA ?  ?Recommendations for follow up therapy are one component of a multi-disciplinary discharge planning process, led by the attending physician.  Recommendations may be updated based on patient status, additional functional criteria and insurance authorization. ? ?Follow Up Recommendations ? Home health PT (if 24hr assist available, otherwise SNF) ?  ?  ?Assistance Recommended at Discharge Frequent or constant Supervision/Assistance  ?Patient can return home with the following A lot of help with walking and/or transfers;Assist for transportation;Assistance with cooking/housework;Help with stairs or ramp for entrance;A little help with bathing/dressing/bathroom ?  ?Equipment Recommendations ? Wheelchair (measurements PT);Wheelchair cushion (measurements PT)  ?  ?Recommendations for Other Services   ? ? ?  ?Precautions / Restrictions Precautions ?Precautions: Fall  ?  ? ?Mobility ? Bed Mobility ?Overal bed mobility: Needs Assistance ?Bed Mobility: Supine to Sit ?  ?  ?Supine to sit: Min  assist ?  ?  ?General bed mobility comments: HOB 10 degrees with HHA to elevate trunk from surface with increased time. pt reporting right thigh pain from sleeping wrong prior to mobility ?  ? ?Transfers ?Overall transfer level: Needs assistance ?  ?Transfers: Sit to/from Stand, Bed to chair/wheelchair/BSC ?Sit to Stand: Min assist ?Stand pivot transfers: Min assist ?  ?  ?  ?  ?General transfer comment: min assist to stand from EOb x 4 and chair x 1. Pt with impaired ability to tolerate weight on RLE and first 3 trials pt immediately sitting back at EOB with mod cues for hand placement and off weigting RLE with bil UE on RW for step pivot to chair with assist to control and guide RW for pivot ?  ? ?Ambulation/Gait ?  ?  ?  ?  ?  ?  ?  ?General Gait Details: unable today due to pain and weakness RLE ? ? ?Stairs ?  ?  ?  ?  ?  ? ? ?Wheelchair Mobility ?  ? ?Modified Rankin (Stroke Patients Only) ?  ? ? ?  ?Balance Overall balance assessment: Needs assistance ?  ?Sitting balance-Leahy Scale: Fair ?Sitting balance - Comments: EOb without assist ?  ?Standing balance support: During functional activity, Reliant on assistive device for balance ?Standing balance-Leahy Scale: Poor ?Standing balance comment: min assist and RW for standing ?  ?  ?  ?  ?  ?  ?  ?  ?  ?  ?  ?  ? ?  ?Cognition Arousal/Alertness: Awake/alert ?Behavior During Therapy: [facility] for tasks assessed/performed ?Overall Cognitive Status: Impaired/Different from baseline ?Area of Impairment: Safety/judgement ?  ?  ?  ?  ?  ?  ?  ?  ?  ?  ?  ?  ?  Safety/Judgement: Decreased awareness of safety, Decreased awareness of deficits ?  ?Problem Solving: Slow processing, Requires verbal cues ?General Comments: Poor awareness of safety/deficits. ?  ?  ? ?  ?Exercises General Exercises - Lower Extremity ?Long Arc Quad: AROM, AAROM, Right, Left, 15 reps, Seated (AAROM on RLe) ?Hip Flexion/Marching: AROM, Both, Seated, 15 reps ? ?  ?General Comments   ?  ?  ? ?Pertinent  Vitals/Pain Pain Assessment ?Pain Assessment: 0-10 ?Pain Score: 5  ?Pain Location: right thigh ?Pain Descriptors / Indicators: Aching, Constant, Guarding ?Pain Intervention(s): Limited activity within patient's tolerance, Monitored during session, Repositioned  ? ? ?Home Living   ?  ?  ?  ?  ?  ?  ?  ?  ?  ?   ?  ?Prior Function    ?  ?  ?   ? ?PT Goals (current goals can now be found in the care plan section) Progress towards PT goals: Not progressing toward goals - comment (limited by RLe pain and weakness) ? ?  ?Frequency ? ? ? Min 3X/week ? ? ? ?  ?PT Plan Current plan remains appropriate  ? ? ?Co-evaluation   ?  ?  ?  ?  ? ?  ?AM-PAC PT "6 Clicks" Mobility   ?Outcome Measure ? Help needed turning from your back to your side while in a flat bed without using bedrails?: A Little ?Help needed moving from lying on your back to sitting on the side of a flat bed without using bedrails?: A Little ?Help needed moving to and from a bed to a chair (including a wheelchair)?: A Little ?Help needed standing up from a chair using your arms (e.g., wheelchair or bedside chair)?: A Little ?Help needed to walk in hospital room?: Total ?Help needed climbing 3-5 steps with a railing? : Total ?6 Click Score: 14 ? ?  ?End of Session Equipment Utilized During Treatment: Gait belt ?Activity Tolerance: Patient limited by pain ?Patient left: in chair;with call bell/phone within reach;with chair alarm set ?Nurse Communication: Mobility status ?PT Visit Diagnosis: Muscle weakness (generalized) (M62.81);Difficulty in walking, not elsewhere classified (R26.2);Unsteadiness on feet (R26.81) ?  ? ? ?Time: 7026-3785 ?PT Time Calculation (min) (ACUTE ONLY): 20 min ? ?Charges:  $Therapeutic Activity: 8-22 mins          ?          ? ?Nekesha Font P, PT ?Acute Rehabilitation Services ?Pager: [phone] ?Office: [phone] ? ? ? ?Kewan Mcnease B Law [place]ino ?09/06/2021, 8:24 AM ? ?

## 2021-09-06 NOTE — TOC Transition Note (Signed)
Transition of Care (TOC) - CM/SW Discharge Note ? ? ?Patient Details  ?Name: Lisa [name] ?MRN: [id number] ?Date of Birth: [birth year] ? ?Transition of Care (TOC) CM/SW Contact:  ?Graves-Bigelow, Ocie Cornfield, RN ?Phone Number: ?09/06/2021, 11:48 AM ? ? ?Clinical Narrative:  Case Manager spoke with the patient regarding home health services vs SNF from the PT recommendations this morning. Niece is at the bedside along with the patient's brother. All are in agreement that the patient will return home with home health services. Family will provide the 24 hour supervision needed. Family is also exploring personal care services for the patient. No further needs identified at this time.  ? ?Final next level of care: [place] ?Barriers to Discharge: Continued Medical Work up ? ? ?Patient Goals and CMS Choice ?Patient states their goals for this hospitalization and ongoing recovery are:: to return home to be as independent as possible. ?CMS Medicare.gov Compare Post Acute Care list provided to:: Patient ?Choice offered to / list presented to : Patient ? ? ?Discharge Plan and Services ?In-house Referral: NA ?Discharge Planning Services: CM Consult ?Post Acute Care Choice: Home Health          ?DME Arranged: N/A ?DME Agency: NA ?  ?  ?  ?HH Arranged: Disease Management, RN, PT, OT, Nurse's Aide, Social Work ?[place] Agency: [facility] ?Date HH Agency Contacted: 09/05/21 ?Time [place]: 1500 ?Representative spoke with at [place]: Tommi Rumps ? ?Readmission Risk Interventions ? ?  09/06/2021  ?  9:23 AM  ?Readmission Risk Prevention Plan  ?Transportation Screening Complete  ?PCP or Specialist Appt within 3-5 Days Complete  ?[place] or Home Care Consult Complete  ?Social Work Consult for [place] Planning/Counseling Complete  ?Palliative Care Screening Not Applicable  ?Medication Review Press photographer) Complete  ? ? ? ? ? ?

## 2021-09-06 NOTE — Progress Notes (Addendum)
? ?Progress Note ? ?Patient Name: Lisa [name] ?Date of Encounter: 09/06/2021 ? ?[facility] Cardiologist: Donato Heinz, MD  ? ?Subjective  ? ?Sitting up in chair no complaints no SOB, no chest pain ? ?Inpatient Medications  ?  ?Scheduled Meds: ? sodium chloride   Intravenous Once  ? amLODipine  10 mg Oral Daily  ? vitamin C  500 mg Oral Daily  ? aspirin EC  81 mg Oral Daily  ? budesonide (PULMICORT) nebulizer solution  0.5 mg Nebulization BID  ? carvedilol  25 mg Oral BID WC  ? cholecalciferol  5,000 Units Oral Daily  ? feeding supplement  237 mL Oral BID BM  ? furosemide  40 mg Oral Daily  ? heparin  5,000 Units Subcutaneous Q8H  ? ipratropium-albuterol  3 mL Nebulization TID  ? isosorbide mononitrate  30 mg Oral Daily  ? nicotine  21 mg Transdermal Daily  ? predniSONE  30 mg Oral Q breakfast  ? ?Continuous Infusions: ? ?PRN Meds: ?acetaminophen **OR** acetaminophen, albuterol, hydrALAZINE, nitroGLYCERIN, ondansetron **OR** ondansetron (ZOFRAN) IV  ? ?Vital Signs  ?  ?Vitals:  ? 09/05/21 1939 09/05/21 2033 09/06/21 0737 09/06/21 0818  ?BP:  (!) 133/52 (!) 154/69   ?Pulse:  67 (!) 58 62  ?Resp:  19 20   ?Temp:  98.9 ?F (37.2 ?C) 97.8 ?F (36.6 ?C)   ?TempSrc:  Oral Oral   ?SpO2: 98% 99% 100% 94%  ?Weight:      ?Height:      ? ? ?Intake/Output Summary (Last 24 hours) at 09/06/2021 0819 ?Last data filed at 09/06/2021 0604 ?Gross per 24 hour  ?Intake --  ?Output 850 ml  ?Net -850 ml  ? ? ?  09/03/2021  ? 12:40 AM 09/01/2021  ?  8:44 PM 09/01/2021  ?  6:21 AM  ?Last 3 Weights  ?Weight (lbs) 109 lb 12.6 oz 107 lb 107 lb  ?Weight (kg) 49.8 kg 48.535 kg 48.535 kg  ?   ? ?Telemetry  ?  ?SR with freq PACs - Personally Reviewed ? ?ECG  ?  ?No new - Personally Reviewed ? ?Physical Exam  ? ?GEN: No acute distress.   ?Neck: No JVD up in chair, + goiter - ENT has evaluated ?Cardiac: RRR, no murmurs, rubs, or gallops.  ?Respiratory: diminished with rhonchi  to auscultation bilaterally. ?GI: Soft, nontender,  non-distended  ?MS: No edema; No deformity. ?Neuro:  Nonfocal  ?Psych: Normal affect  ? ?Labs  ?  ?High Sensitivity Troponin:   ?Recent Labs  ?Lab 09/01/21 ?2146 09/01/21 ?2350 09/02/21 ?0230 09/02/21 ?1447 09/03/21 ?0932  ?TROPONINIHS 18* 57* 117* 409* 441*  ?   ?Chemistry ?Recent Labs  ?Lab 08/30/21 ?1459 09/01/21 ?2146 09/03/21 ?0932 09/04/21 ?1062 09/06/21 ?0305  ?NA 141   < > 136 136 137  ?K 4.4   < > 4.5 4.4 4.2  ?CL 106   < > 105 104 102  ?CO2 24   < > 23 26 29   ?GLUCOSE 94   < > 143* 129* 101*  ?BUN 40*   < > 53* 61* 86*  ?CREATININE 1.67*   < > 1.71* 1.71* 1.89*  ?CALCIUM 9.2   < > 8.9 8.9 8.8*  ?MG  --   --   --   --  2.3  ?PROT 8.5*  --   --   --   --   ?ALBUMIN 3.2*  --   --   --   --   ?AST 27  --   --   --   --   ?  ALT 17  --   --   --   --   ?ALKPHOS 47  --   --   --   --   ?BILITOT 0.6  --   --   --   --   ?GFRNONAA 30*   < > 29* 29* 26*  ?ANIONGAP 11   < > 8 6 6   ? < > = values in this interval not displayed.  ?  ?Lipids No results for input(s): CHOL, TRIG, HDL, LABVLDL, LDLCALC, CHOLHDL in the last 168 hours.  ?Hematology ?Recent Labs  ?Lab 09/04/21 ?0332 09/05/21 ?0233 09/06/21 ?0305  ?WBC 4.6 4.9 3.8*  ?RBC 3.04* 3.28* 2.82*  ?HGB 9.4* 10.1* 8.6*  ?HCT 28.2* 30.6* 26.3*  ?MCV 92.8 93.3 93.3  ?MCH 30.9 30.8 30.5  ?MCHC 33.3 33.0 32.7  ?RDW 12.7 12.6 12.5  ?PLT 91* 103* 92*  ? ?Thyroid No results for input(s): TSH, FREET4 in the last 168 hours.  ?BNPNo results for input(s): BNP, PROBNP in the last 168 hours.  ?DDimer No results for input(s): DDIMER in the last 168 hours.  ? ?Radiology  ?  ?CT SOFT TISSUE NECK WO CONTRAST ? ?Result Date: 09/04/2021 ?CLINICAL DATA:  Painful swallowing, dysphagia, large goiter EXAM: CT NECK WITHOUT CONTRAST TECHNIQUE: Multidetector CT imaging of the neck was performed following the standard protocol without intravenous contrast. RADIATION DOSE REDUCTION: This exam was performed according to the departmental dose-optimization program which includes automated exposure  control, adjustment of the mA and/or kV according to patient size and/or use of iterative reconstruction technique. COMPARISON:  CT neck 09/16/2018 FINDINGS: Evaluation is degraded by noncontrast technique as well as motion and streak artifact through the neck. Pharynx and larynx: The nasal cavity and nasopharynx are grossly unremarkable. The oral cavity and oropharynx are grossly unremarkable. The parapharyngeal fat appears preserved. The hypopharynx and larynx are grossly unremarkable, though the vocal folds are not well assessed. Salivary glands: The parotid glands are unremarkable. The submandibular glands are not well seen. Thyroid: The thyroid is markedly enlarged extending inferiorly into the upper mediastinum as well as superiorly into the left neck to the level of the angle of the mandible. The thyroid parenchyma appears heterogeneous with coarse internal calcifications. Overall, the size and appearance is grossly similar to the study from 2020. The thyroid exert mass effect on the trachea with slight rightward deviation of the level of the clavicular heads but no significant luminal narrowing. Lymph nodes: No pathologic lymphadenopathy is seen in the neck. Vascular: Calcified plaque is seen in the bilateral carotid bifurcations and aortic arch. Limited intracranial: The imaged portions of the intracranial compartment are grossly unremarkable. Visualized orbits: The globes and orbits are unremarkable. Mastoids and visualized paranasal sinuses: The paranasal sinuses and mastoid air cells are clear. Skeleton: There is advanced multilevel degenerative change of the cervical spine with grade 1 anterolisthesis of C4 on C5. Disc space narrowing and degenerative endplate irregularity is most advanced at C3-C4, C5-C6, C6-C7, and C7-T1, grossly similar to the prior CT from 2020. There is no evidence of acute osseous abnormality or aggressive osseous lesion. Upper chest: There is consolidative opacity in the left  upper lobe. Opacity surrounding the solid nodule seen on the CT chest from 08/26/2021 have increased. No apical pneumothorax is seen. Other: None. IMPRESSION: 1. Image quality is suboptimal due to noncontrast technique as well as streak and motion artifact. 2. Within this confine, the massively enlarged thyroid is grossly similar in size and appearance compared to the CT  from 09/16/2018. The thyroid extends into the upper mediastinum as well as into the left neck to the level of the angle of the mandible. There is mild mass effect on the surrounding structures with slight rightward tracheal deviation of the level of the clavicular heads but no significant luminal narrowing. 3. Consolidative opacity in the left upper lobe in the location of a previously identified nodule seen on the CT chest of 08/26/2021. Opacity surrounding the nodule have increased since that study, which may reflect postprocedural hemorrhage. No apical pneumothorax is seen. Electronically Signed   By: Valetta Mole M.D.   On: 09/04/2021 16:14  ? ?DG CHEST PORT 1 VIEW ? ?Result Date: 09/04/2021 ?CLINICAL DATA:  87 year old female with history of cough and shortness of breath. EXAM: PORTABLE CHEST 1 VIEW COMPARISON:  Chest x-ray 09/02/2021. FINDINGS: Fiducial markers in the left upper lobe. Ill-defined mass-like opacity adjacent to the fiducial markers corresponding to known left upper lobe pulmonary nodule, likely with a small amount of perilesional hemorrhage related to recent biopsy, similar to the recent prior study. Lungs are otherwise clear. No pleural effusions. No pneumothorax. No evidence of pulmonary edema. Heart size is normal. Upper mediastinal contours are within normal limits. Atherosclerotic calcifications are noted in the thoracic aorta. IMPRESSION: 1. Left upper lobe pulmonary nodule again noted, with ill-defined opacities surrounding the pulmonary nodules, new compared to pre biopsy study 08/30/2021, likely reflective of a small  amount of perilesional hemorrhage. This appears similar to the most recent prior study. Electronically Signed   By: Vinnie Langton M.D.   On: 09/04/2021 11:33  ? ?DG ESOPHAGUS W SINGLE CM (SOL OR THIN BA) ? ?Result D

## 2021-09-10 DIAGNOSIS — I700 Atherosclerosis of aorta: Secondary | ICD-10-CM | POA: Diagnosis not present

## 2021-09-10 DIAGNOSIS — I2510 Atherosclerotic heart disease of native coronary artery without angina pectoris: Secondary | ICD-10-CM | POA: Diagnosis not present

## 2021-09-10 DIAGNOSIS — F17210 Nicotine dependence, cigarettes, uncomplicated: Secondary | ICD-10-CM | POA: Diagnosis not present

## 2021-09-10 DIAGNOSIS — J449 Chronic obstructive pulmonary disease, unspecified: Secondary | ICD-10-CM | POA: Diagnosis not present

## 2021-09-10 DIAGNOSIS — C9000 Multiple myeloma not having achieved remission: Secondary | ICD-10-CM | POA: Diagnosis not present

## 2021-09-10 DIAGNOSIS — R918 Other nonspecific abnormal finding of lung field: Secondary | ICD-10-CM | POA: Diagnosis not present

## 2021-09-10 DIAGNOSIS — I160 Hypertensive urgency: Secondary | ICD-10-CM | POA: Diagnosis not present

## 2021-09-10 DIAGNOSIS — R1310 Dysphagia, unspecified: Secondary | ICD-10-CM | POA: Diagnosis not present

## 2021-09-10 DIAGNOSIS — M4802 Spinal stenosis, cervical region: Secondary | ICD-10-CM | POA: Diagnosis not present

## 2021-09-10 DIAGNOSIS — Z7982 Long term (current) use of aspirin: Secondary | ICD-10-CM | POA: Diagnosis not present

## 2021-09-10 DIAGNOSIS — M47812 Spondylosis without myelopathy or radiculopathy, cervical region: Secondary | ICD-10-CM | POA: Diagnosis not present

## 2021-09-10 DIAGNOSIS — M4312 Spondylolisthesis, cervical region: Secondary | ICD-10-CM | POA: Diagnosis not present

## 2021-09-10 DIAGNOSIS — Z87440 Personal history of urinary (tract) infections: Secondary | ICD-10-CM | POA: Diagnosis not present

## 2021-09-10 DIAGNOSIS — D630 Anemia in neoplastic disease: Secondary | ICD-10-CM | POA: Diagnosis not present

## 2021-09-10 DIAGNOSIS — N184 Chronic kidney disease, stage 4 (severe): Secondary | ICD-10-CM | POA: Diagnosis not present

## 2021-09-10 DIAGNOSIS — D631 Anemia in chronic kidney disease: Secondary | ICD-10-CM | POA: Diagnosis not present

## 2021-09-10 DIAGNOSIS — I1310 Hypertensive heart and chronic kidney disease without heart failure, with stage 1 through stage 4 chronic kidney disease, or unspecified chronic kidney disease: Secondary | ICD-10-CM | POA: Diagnosis not present

## 2021-09-10 DIAGNOSIS — Z9181 History of falling: Secondary | ICD-10-CM | POA: Diagnosis not present

## 2021-09-10 DIAGNOSIS — M4803 Spinal stenosis, cervicothoracic region: Secondary | ICD-10-CM | POA: Diagnosis not present

## 2021-09-11 ENCOUNTER — Encounter (HOSPITAL_COMMUNITY): Payer: Self-pay | Admitting: Hematology

## 2021-09-11 DIAGNOSIS — C3492 Malignant neoplasm of unspecified part of left bronchus or lung: Secondary | ICD-10-CM | POA: Insufficient documentation

## 2021-09-12 ENCOUNTER — Inpatient Hospital Stay (HOSPITAL_COMMUNITY): Payer: Medicare Other | Attending: Hematology | Admitting: Hematology

## 2021-09-12 VITALS — BP 147/51 | HR 58 | Temp 97.1°F | Resp 18 | Wt 102.1 lb

## 2021-09-12 DIAGNOSIS — D649 Anemia, unspecified: Secondary | ICD-10-CM | POA: Diagnosis not present

## 2021-09-12 DIAGNOSIS — D472 Monoclonal gammopathy: Secondary | ICD-10-CM | POA: Insufficient documentation

## 2021-09-12 DIAGNOSIS — D509 Iron deficiency anemia, unspecified: Secondary | ICD-10-CM

## 2021-09-12 DIAGNOSIS — R918 Other nonspecific abnormal finding of lung field: Secondary | ICD-10-CM | POA: Diagnosis not present

## 2021-09-12 DIAGNOSIS — Z87891 Personal history of nicotine dependence: Secondary | ICD-10-CM | POA: Insufficient documentation

## 2021-09-12 DIAGNOSIS — Z79899 Other long term (current) drug therapy: Secondary | ICD-10-CM | POA: Insufficient documentation

## 2021-09-12 DIAGNOSIS — C3412 Malignant neoplasm of upper lobe, left bronchus or lung: Secondary | ICD-10-CM | POA: Diagnosis not present

## 2021-09-12 LAB — IRON AND TIBC
Iron: 68 ug/dL (ref 28–170)
Saturation Ratios: 22 % (ref 10.4–31.8)
TIBC: 306 ug/dL (ref 250–450)
UIBC: 238 ug/dL

## 2021-09-12 LAB — FERRITIN: Ferritin: 704 ng/mL — ABNORMAL HIGH (ref 11–307)

## 2021-09-12 NOTE — Progress Notes (Addendum)
? ?[facility] ?[address] ?[place], [place] [address] ? ? ?CLINIC:  ?Medical Oncology/Hematology ? ?PCP:  ?Lisa Meiers, MD ?[place] / [place] [address]  ?[phone] ? ?REASON FOR VISIT:  ?Follow-up for MGUS ? ?PRIOR THERAPY: none ? ?CURRENT THERAPY: surveillance ? ?INTERVAL HISTORY:  ?[name], a 87 y.o. female, returns for routine follow-up for her MGUS. [place] was last seen on. ? ?Today she reports feeling good. She received an IV iron infusion in February. She reports a history of enlarged thyroid over the past 40-50 years. She denies trouble swallowing. She quit smoking since her recent hospitalization on 03/23.  ? ?REVIEW OF SYSTEMS:  ?Review of Systems  ?Constitutional:  Negative for appetite change and fatigue.  ?HENT:   Negative for trouble swallowing.   ?All other systems reviewed and are negative. ? ?PAST MEDICAL/SURGICAL HISTORY:  ?Past Medical History:  ?Diagnosis Date  ? Anemia   ? Carotid artery stenosis   ? CKD (chronic kidney disease)   ? COPD (chronic obstructive pulmonary disease) ([place])   ? Dyspnea   ? Due to COPD per patient  ? Goiter diffuse, nontoxic   ? Per patient  ? Hepatitis C   ? Hypertension   ? Monoclonal gammopathy of unknown significance (MGUS)   ? Pyelonephritis 04/06/2014  ? UTI  ? ?Past Surgical History:  ?Procedure Laterality Date  ? ABDOMINAL HYSTERECTOMY    ? CHOLECYSTECTOMY N/A 09/17/2018  ? Procedure: LAPAROSCOPIC CHOLECYSTECTOMY;  Surgeon: Virl Cagey, MD;  Location: [facility];  Service: General;  Laterality: N/A;  ? FUDUCIAL PLACEMENT Left 09/01/2021  ? Procedure: PLACEMENT OF FIDUCIAL MARKERS TIMES FOUR;  Surgeon: Melrose Nakayama, MD;  Location: [facility];  Service: Thoracic;  Laterality: Left;  ? TEE WITHOUT CARDIOVERSION N/A 04/10/2014  ? Procedure: TRANSESOPHAGEAL ECHOCARDIOGRAM (TEE);  Surgeon: [name], MD;  Location: [facility];  Service: Cardiovascular;  Laterality: N/A;  ? VIDEO BRONCHOSCOPY WITH  ENDOBRONCHIAL NAVIGATION N/A 09/01/2021  ? Procedure: VIDEO BRONCHOSCOPY WITH ENDOBRONCHIAL NAVIGATION;  Surgeon: Melrose Nakayama, MD;  Location: [facility];  Service: Thoracic;  Laterality: N/A;  ? ? ?SOCIAL HISTORY:  ?Social History  ? ?Socioeconomic History  ? Marital status: Widowed  ?  Spouse name: Not on file  ? Number of children: Not on file  ? Years of education: Not on file  ? Highest education level: Not on file  ?Occupational History  ? Not on file  ?Tobacco Use  ? Smoking status: Every Day  ?  Packs/day: 0.25  ?  Types: Cigarettes  ? Smokeless tobacco: Never  ?Vaping Use  ? Vaping Use: Never used  ?Substance and Sexual Activity  ? Alcohol use: No  ? Drug use: No  ? Sexual activity: Not on file  ?Other Topics Concern  ? Not on file  ?Social History Narrative  ? Not on file  ? ?Social Determinants of Health  ? ?Financial Resource Strain: Not on file  ?Food Insecurity: Not on file  ?Transportation Needs: Not on file  ?Physical Activity: Not on file  ?Stress: Not on file  ?Social Connections: Not on file  ?Intimate Partner Violence: Not on file  ? ? ?FAMILY HISTORY:  ?Family History  ?Problem Relation Age of Onset  ? Hypertension Mother   ? Thyroid disease Mother   ? Hypertension Father   ? Thyroid disease Sister   ? Thyroid disease Brother   ? Thyroid disease Sister   ? ? ?CURRENT MEDICATIONS:  ?  Current Outpatient Medications  ?Medication Sig Dispense Refill  ? amLODipine (NORVASC) 10 MG tablet Take 1 tablet (10 mg total) by mouth daily. 30 tablet 2  ? aspirin 81 MG EC tablet Take 81 mg by mouth in the morning.    ? calcium carbonate (OS-CAL - DOSED IN MG OF ELEMENTAL CALCIUM) 1250 (500 Ca) MG tablet Take 1 tablet by mouth daily with breakfast.    ? carvedilol (COREG) 25 MG tablet Take 1 tablet (25 mg total) by mouth 2 (two) times daily with a meal. 60 tablet 2  ? Cholecalciferol (VITAMIN D3) 125 MCG (5000 UT) CAPS Take 5,000 Units by mouth daily.    ? feeding supplement, ENSURE COMPLETE, (ENSURE COMPLETE)  LIQD Take 237 mLs by mouth 2 (two) times daily between meals.    ? furosemide (LASIX) 20 MG tablet Take 20 mg by mouth 2 (two) times daily. Morning & afternoon    ? Ipratropium-Albuterol (COMBIVENT) 20-100 MCG/ACT AERS respimat Inhale 1 puff into the lungs every 6 (six) hours as needed for wheezing or shortness of breath.     ? ipratropium-albuterol (DUONEB) 0.5-2.5 (3) MG/3ML SOLN Take 3 mLs by nebulization every 4 (four) hours as needed (wheezing/shortness of breath).    ? isosorbide mononitrate (IMDUR) 30 MG 24 hr tablet Take 1 tablet (30 mg total) by mouth daily. 30 tablet 2  ? nitroGLYCERIN (NITROSTAT) 0.4 MG SL tablet Place 1 tablet (0.4 mg total) under the tongue every 5 (five) minutes as needed for chest pain. 100 tablet 1  ? ondansetron (ZOFRAN) 4 MG tablet Take 1 tablet (4 mg total) by mouth every 6 (six) hours as needed for nausea. 20 tablet 0  ? ?No current facility-administered medications for this visit.  ? ? ?ALLERGIES:  ?No Known Allergies ? ?PHYSICAL EXAM:  ?Performance status (ECOG): 2 - Symptomatic, <50% confined to bed ? ?Vitals:  ? 09/12/21 1521  ?BP: (!) 147/51  ?Pulse: (!) 58  ?Resp: 18  ?Temp: (!) 97.1 ?F (36.2 ?C)  ?SpO2: 99%  ? ?Wt Readings from Last 3 Encounters:  ?09/12/21 102 lb 1.2 oz (46.3 kg)  ?09/03/21 109 lb 12.6 oz (49.8 kg)  ?09/01/21 107 lb (48.5 kg)  ? ?Physical Exam ?Vitals reviewed.  ?Constitutional:   ?   Appearance: Normal appearance.  ?   Comments: In wheelchair  ?Cardiovascular:  ?   Rate and Rhythm: Normal rate and regular rhythm.  ?   Pulses: Normal pulses.  ?   Heart sounds: Normal heart sounds.  ?Pulmonary:  ?   Effort: Pulmonary effort is normal.  ?   Breath sounds: Normal breath sounds.  ?Neurological:  ?   General: No focal deficit present.  ?   Mental Status: She is alert and oriented to person, place, and time.  ?Psychiatric:     ?   Mood and Affect: Mood normal.     ?   Behavior: Behavior normal.  ? ? ?LABORATORY DATA:  ?I have reviewed the labs as listed.  ? ?   Latest Ref Rng & Units 09/06/2021  ?  3:05 AM 09/05/2021  ?  2:33 AM 09/04/2021  ?  3:32 AM  ?CBC  ?WBC 4.0 - 10.5 K/uL 3.8   4.9   4.6    ?Hemoglobin 12.0 - 15.0 g/dL 8.6   10.1   9.4    ?Hematocrit 36.0 - 46.0 % 26.3   30.6   28.2    ?Platelets 150 - 400 K/uL 92   103  91    ? ? ?  Latest Ref Rng & Units 09/06/2021  ?  3:05 AM 09/04/2021  ?  3:32 AM 09/03/2021  ?  9:32 AM  ?CMP  ?Glucose 70 - 99 mg/dL 101   129   143    ?BUN 8 - 23 mg/dL 86   61   53    ?Creatinine 0.44 - 1.00 mg/dL 1.89   1.71   1.71    ?Sodium 135 - 145 mmol/L 137   136   136    ?Potassium 3.5 - 5.1 mmol/L 4.2   4.4   4.5    ?Chloride 98 - 111 mmol/L 102   104   105    ?CO2 22 - 32 mmol/L 29   26   23     ?Calcium 8.9 - 10.3 mg/dL 8.8   8.9   8.9    ? ?   ?Component Value Date/Time  ? RBC 2.82 (L) 09/06/2021 0305  ? MCV 93.3 09/06/2021 0305  ? MCH 30.5 09/06/2021 0305  ? MCHC 32.7 09/06/2021 0305  ? RDW 12.5 09/06/2021 0305  ? LYMPHSABS 1.0 07/14/2021 0912  ? MONOABS 0.3 07/14/2021 0912  ? EOSABS 0.0 07/14/2021 0912  ? BASOSABS 0.0 07/14/2021 0912  ? ? ?DIAGNOSTIC IMAGING:  ?I have independently reviewed the scans and discussed with the patient. ?DG Chest 2 View ? ?Result Date: 09/01/2021 ?CLINICAL DATA:  Mid chest pain and shortness of breath, left upper lobe lung biopsy earlier today EXAM: CHEST - 2 VIEW COMPARISON:  09/01/2021 at 10:40 a.m. FINDINGS: Frontal and lateral views of the chest demonstrate a stable cardiac silhouette. Fiduciary markers are again noted at the site of a known left upper lobe mass. No new airspace disease, effusion, or pneumothorax. Calcified thyroid goiter again noted. No acute bony abnormalities. IMPRESSION: 1. Postsurgical changes from left upper lobe mass biopsy, with fiduciary markers in place. No evidence of complication. Electronically Signed   By: Randa Ngo M.D.   On: 09/01/2021 21:12  ? ?DG Chest 2 View ? ?Result Date: 09/01/2021 ?CLINICAL DATA:  Scheduled for bronchoscopy assisted biopsy of left upper lobe  nodule. EXAM: CHEST - 2 VIEW COMPARISON:  CT chest 08/26/2021 FINDINGS: Cardiomediastinal silhouette and pulmonary vasculature are within normal limits. Left upper lobe lung mass again noted.  Lungs otherwise

## 2021-09-12 NOTE — Patient Instructions (Addendum)
[place] at [facility] ?Discharge Instructions ? ?You were seen and examined today by Dr. Delton Coombes.  ? ?Dr. Delton Coombes reviewed your recent biopsy results which revealed a common type of lung cancer known as adenocarcinoma. You have been diagnosed with a Stage I Lung Cancer. ? ?According to Dr. Roxan Hockey, you are not a surgical candidate. You are being referred to [facility] for Radiation Oncology. They have an option for a targeted radiation therapy with a chance for cure. You will then be followed with scans on a regular basis. ? ?According to your hospital labs, you are anemic. We will recheck your iron levels to see if you need any additional iron. ? ?Please follow-up with Dr. Delton Coombes as scheduled in 4 months following radiation and a repeat CT scan. ? ?Please call the cancer center with additional questions. ? ? ?Thank you for choosing [place] at [facility] to provide your oncology and hematology care.  To afford each patient quality time with our provider, please arrive at least 15 minutes before your scheduled appointment time.  ? ?If you have a lab appointment with the [facility] please come in thru the Main Entrance and check in at the main information desk. ? ?You need to re-schedule your appointment should you arrive 10 or more minutes late.  We strive to give you quality time with our providers, and arriving late affects you and other patients whose appointments are after yours.  Also, if you no show three or more times for appointments you may be dismissed from the clinic at the providers discretion.     ?Again, thank you for choosing [facility].  Our hope is that these requests will decrease the amount of time that you wait before being seen by our physicians.       ?_____________________________________________________________ ? ?Should you have questions after your visit to [facility], please  contact our office at [phone] and follow the prompts.  Our office hours are 8:00 a.m. and 4:30 p.m. Monday - Friday.  Please note that voicemails left after 4:00 p.m. may not be returned until the following business day.  We are closed weekends and major holidays.  You do have access to a nurse 24-7, just call the main number to the clinic [phone] and do not press any options, hold on the line and a nurse will answer the phone.   ? ?For prescription refill requests, have your pharmacy contact our office and allow 72 hours.   ? ?Due to Covid, you will need to wear a mask upon entering the hospital. If you do not have a mask, a mask will be given to you at the Main Entrance upon arrival. For doctor visits, patients may have 1 support person age 67 or older with them. For treatment visits, patients can not have anyone with them due to social distancing guidelines and our immunocompromised population.  ? ? ? ?

## 2021-09-13 ENCOUNTER — Other Ambulatory Visit (HOSPITAL_COMMUNITY): Payer: Medicare Other

## 2021-09-13 DIAGNOSIS — Z9181 History of falling: Secondary | ICD-10-CM | POA: Diagnosis not present

## 2021-09-13 DIAGNOSIS — M47812 Spondylosis without myelopathy or radiculopathy, cervical region: Secondary | ICD-10-CM | POA: Diagnosis not present

## 2021-09-13 DIAGNOSIS — J449 Chronic obstructive pulmonary disease, unspecified: Secondary | ICD-10-CM | POA: Diagnosis not present

## 2021-09-13 DIAGNOSIS — I700 Atherosclerosis of aorta: Secondary | ICD-10-CM | POA: Diagnosis not present

## 2021-09-13 DIAGNOSIS — I1310 Hypertensive heart and chronic kidney disease without heart failure, with stage 1 through stage 4 chronic kidney disease, or unspecified chronic kidney disease: Secondary | ICD-10-CM | POA: Diagnosis not present

## 2021-09-13 DIAGNOSIS — D630 Anemia in neoplastic disease: Secondary | ICD-10-CM | POA: Diagnosis not present

## 2021-09-13 DIAGNOSIS — N184 Chronic kidney disease, stage 4 (severe): Secondary | ICD-10-CM | POA: Diagnosis not present

## 2021-09-13 DIAGNOSIS — C9000 Multiple myeloma not having achieved remission: Secondary | ICD-10-CM | POA: Diagnosis not present

## 2021-09-13 DIAGNOSIS — Z87440 Personal history of urinary (tract) infections: Secondary | ICD-10-CM | POA: Diagnosis not present

## 2021-09-13 DIAGNOSIS — I160 Hypertensive urgency: Secondary | ICD-10-CM | POA: Diagnosis not present

## 2021-09-13 DIAGNOSIS — F17210 Nicotine dependence, cigarettes, uncomplicated: Secondary | ICD-10-CM | POA: Diagnosis not present

## 2021-09-13 DIAGNOSIS — M4802 Spinal stenosis, cervical region: Secondary | ICD-10-CM | POA: Diagnosis not present

## 2021-09-13 DIAGNOSIS — Z7982 Long term (current) use of aspirin: Secondary | ICD-10-CM | POA: Diagnosis not present

## 2021-09-13 DIAGNOSIS — R918 Other nonspecific abnormal finding of lung field: Secondary | ICD-10-CM | POA: Diagnosis not present

## 2021-09-13 DIAGNOSIS — M4312 Spondylolisthesis, cervical region: Secondary | ICD-10-CM | POA: Diagnosis not present

## 2021-09-13 DIAGNOSIS — D631 Anemia in chronic kidney disease: Secondary | ICD-10-CM | POA: Diagnosis not present

## 2021-09-13 DIAGNOSIS — M4803 Spinal stenosis, cervicothoracic region: Secondary | ICD-10-CM | POA: Diagnosis not present

## 2021-09-13 DIAGNOSIS — I2510 Atherosclerotic heart disease of native coronary artery without angina pectoris: Secondary | ICD-10-CM | POA: Diagnosis not present

## 2021-09-13 DIAGNOSIS — R1310 Dysphagia, unspecified: Secondary | ICD-10-CM | POA: Diagnosis not present

## 2021-09-14 ENCOUNTER — Encounter (HOSPITAL_COMMUNITY): Payer: Self-pay

## 2021-09-14 DIAGNOSIS — I160 Hypertensive urgency: Secondary | ICD-10-CM | POA: Diagnosis not present

## 2021-09-14 DIAGNOSIS — Z7982 Long term (current) use of aspirin: Secondary | ICD-10-CM | POA: Diagnosis not present

## 2021-09-14 DIAGNOSIS — R1310 Dysphagia, unspecified: Secondary | ICD-10-CM | POA: Diagnosis not present

## 2021-09-14 DIAGNOSIS — D630 Anemia in neoplastic disease: Secondary | ICD-10-CM | POA: Diagnosis not present

## 2021-09-14 DIAGNOSIS — R918 Other nonspecific abnormal finding of lung field: Secondary | ICD-10-CM | POA: Diagnosis not present

## 2021-09-14 DIAGNOSIS — M4802 Spinal stenosis, cervical region: Secondary | ICD-10-CM | POA: Diagnosis not present

## 2021-09-14 DIAGNOSIS — F17210 Nicotine dependence, cigarettes, uncomplicated: Secondary | ICD-10-CM | POA: Diagnosis not present

## 2021-09-14 DIAGNOSIS — M4312 Spondylolisthesis, cervical region: Secondary | ICD-10-CM | POA: Diagnosis not present

## 2021-09-14 DIAGNOSIS — N184 Chronic kidney disease, stage 4 (severe): Secondary | ICD-10-CM | POA: Diagnosis not present

## 2021-09-14 DIAGNOSIS — M4803 Spinal stenosis, cervicothoracic region: Secondary | ICD-10-CM | POA: Diagnosis not present

## 2021-09-14 DIAGNOSIS — D631 Anemia in chronic kidney disease: Secondary | ICD-10-CM | POA: Diagnosis not present

## 2021-09-14 DIAGNOSIS — Z9181 History of falling: Secondary | ICD-10-CM | POA: Diagnosis not present

## 2021-09-14 DIAGNOSIS — I700 Atherosclerosis of aorta: Secondary | ICD-10-CM | POA: Diagnosis not present

## 2021-09-14 DIAGNOSIS — J449 Chronic obstructive pulmonary disease, unspecified: Secondary | ICD-10-CM | POA: Diagnosis not present

## 2021-09-14 DIAGNOSIS — Z87440 Personal history of urinary (tract) infections: Secondary | ICD-10-CM | POA: Diagnosis not present

## 2021-09-14 DIAGNOSIS — I1310 Hypertensive heart and chronic kidney disease without heart failure, with stage 1 through stage 4 chronic kidney disease, or unspecified chronic kidney disease: Secondary | ICD-10-CM | POA: Diagnosis not present

## 2021-09-14 DIAGNOSIS — M47812 Spondylosis without myelopathy or radiculopathy, cervical region: Secondary | ICD-10-CM | POA: Diagnosis not present

## 2021-09-14 DIAGNOSIS — I2510 Atherosclerotic heart disease of native coronary artery without angina pectoris: Secondary | ICD-10-CM | POA: Diagnosis not present

## 2021-09-14 DIAGNOSIS — C9000 Multiple myeloma not having achieved remission: Secondary | ICD-10-CM | POA: Diagnosis not present

## 2021-09-15 ENCOUNTER — Encounter: Payer: Self-pay | Admitting: *Deleted

## 2021-09-15 ENCOUNTER — Other Ambulatory Visit: Payer: Self-pay | Admitting: *Deleted

## 2021-09-15 DIAGNOSIS — R1310 Dysphagia, unspecified: Secondary | ICD-10-CM | POA: Diagnosis not present

## 2021-09-15 DIAGNOSIS — I129 Hypertensive chronic kidney disease with stage 1 through stage 4 chronic kidney disease, or unspecified chronic kidney disease: Secondary | ICD-10-CM | POA: Diagnosis not present

## 2021-09-15 DIAGNOSIS — C9000 Multiple myeloma not having achieved remission: Secondary | ICD-10-CM | POA: Diagnosis not present

## 2021-09-15 DIAGNOSIS — Z9181 History of falling: Secondary | ICD-10-CM | POA: Diagnosis not present

## 2021-09-15 DIAGNOSIS — M4802 Spinal stenosis, cervical region: Secondary | ICD-10-CM | POA: Diagnosis not present

## 2021-09-15 DIAGNOSIS — I1310 Hypertensive heart and chronic kidney disease without heart failure, with stage 1 through stage 4 chronic kidney disease, or unspecified chronic kidney disease: Secondary | ICD-10-CM | POA: Diagnosis not present

## 2021-09-15 DIAGNOSIS — Z7982 Long term (current) use of aspirin: Secondary | ICD-10-CM | POA: Diagnosis not present

## 2021-09-15 DIAGNOSIS — I160 Hypertensive urgency: Secondary | ICD-10-CM | POA: Diagnosis not present

## 2021-09-15 DIAGNOSIS — N184 Chronic kidney disease, stage 4 (severe): Secondary | ICD-10-CM | POA: Diagnosis not present

## 2021-09-15 DIAGNOSIS — M47812 Spondylosis without myelopathy or radiculopathy, cervical region: Secondary | ICD-10-CM | POA: Diagnosis not present

## 2021-09-15 DIAGNOSIS — R918 Other nonspecific abnormal finding of lung field: Secondary | ICD-10-CM | POA: Diagnosis not present

## 2021-09-15 DIAGNOSIS — D696 Thrombocytopenia, unspecified: Secondary | ICD-10-CM | POA: Diagnosis not present

## 2021-09-15 DIAGNOSIS — F17210 Nicotine dependence, cigarettes, uncomplicated: Secondary | ICD-10-CM | POA: Diagnosis not present

## 2021-09-15 DIAGNOSIS — J449 Chronic obstructive pulmonary disease, unspecified: Secondary | ICD-10-CM | POA: Diagnosis not present

## 2021-09-15 DIAGNOSIS — I2510 Atherosclerotic heart disease of native coronary artery without angina pectoris: Secondary | ICD-10-CM | POA: Diagnosis not present

## 2021-09-15 DIAGNOSIS — D638 Anemia in other chronic diseases classified elsewhere: Secondary | ICD-10-CM | POA: Diagnosis not present

## 2021-09-15 DIAGNOSIS — C3412 Malignant neoplasm of upper lobe, left bronchus or lung: Secondary | ICD-10-CM | POA: Insufficient documentation

## 2021-09-15 DIAGNOSIS — D630 Anemia in neoplastic disease: Secondary | ICD-10-CM | POA: Diagnosis not present

## 2021-09-15 DIAGNOSIS — D631 Anemia in chronic kidney disease: Secondary | ICD-10-CM | POA: Diagnosis not present

## 2021-09-15 DIAGNOSIS — I700 Atherosclerosis of aorta: Secondary | ICD-10-CM | POA: Diagnosis not present

## 2021-09-15 DIAGNOSIS — Z87440 Personal history of urinary (tract) infections: Secondary | ICD-10-CM | POA: Diagnosis not present

## 2021-09-15 DIAGNOSIS — M4312 Spondylolisthesis, cervical region: Secondary | ICD-10-CM | POA: Diagnosis not present

## 2021-09-15 DIAGNOSIS — M4803 Spinal stenosis, cervicothoracic region: Secondary | ICD-10-CM | POA: Diagnosis not present

## 2021-09-15 NOTE — Progress Notes (Signed)
Location of tumor and Histology per Pathology Report: left upper lobe of the lung ? ?Biopsy:  ?09/01/2021 ?A. LEFT LUNG, UPPER LOBE, BIOPSY:  ?Moderately differentiated adenocarcinoma ? ?A. LUNG, LEFT UPPER LOBE, BRUSHING:  ?-Malignant cells present consistent with Adenocarcinoma  ?B. LUNG, LEFT UPPER LOBE, FINE NEEDLE ASPIRATION:  ?- Malignant cells consistent with Adenocarcinoma ? ?Past/Anticipated interventions by surgeon, if any:   ?09/01/2021 ?PROCEDURE:  Electromagnetic navigational bronchoscopy with brushings, needle aspirations, transbronchial biopsies and fiducial placement. ?  ?SURGEON:  Revonda Standard. Roxan Hockey, MD ? ?She was evaluated by Dr. Roxan Hockey and felt to be a nonsurgical candidate ? ?Past/Anticipated interventions by medical oncology, if any: Chemotherapy - not at this time ? ? ? ?Pain issues, if any:  no  ? ?SAFETY ISSUES: ?Prior radiation? no ?Pacemaker/ICD? no ?Possible current pregnancy? no ?Is the patient on methotrexate? no ? ?Current Complaints / other details:  shortness of breath with activity, dry cough, emphysema, stage IV CKD, denies hemoptysis, anemia with recent iron infusion, swelling in legs and feet, hand eye coordination problems, HOH, HTN ?   ? ?Vitals:  ? 09/19/21 0750  ?BP: 108/71  ?Pulse: (!) 52  ?Resp: (!) 24  ?Temp: (!) 97.5 ?F (36.4 ?C)  ?SpO2: 99%  ?Weight: 104 lb (47.2 kg)  ?Height: 5\' 6"  (1.676 m)  ? ? ? ?

## 2021-09-15 NOTE — Progress Notes (Signed)
The proposed treatment discussed in cancer conference is for discussion purpose only and is not a binding recommendation. The patient was not physically examined nor present for their treatment options. Therefore, final treatment plans cannot be decided.  ?

## 2021-09-15 NOTE — Progress Notes (Signed)
Thoracic cancer conference on 09/15/21 documented.  See flow sheet for update. ?

## 2021-09-18 NOTE — Progress Notes (Signed)
?Radiation Oncology         (336) [phone] ?________________________________ ? ?Initial Outpatient Consultation ? ?Name: Lisa [name] MRN: [id number]  ?Date: 09/19/2021  DOB: 08-13-1934 ? ?ZG:YFVCB, Normajean Baxter, MD  Derek Jack, MD  ? ?REFERRING PHYSICIAN: Derek Jack, MD ? ?DIAGNOSIS: The encounter diagnosis was Malignant neoplasm of upper lobe of left lung ([place]). ? ?moderately differentiated adenocarcinoma of the LUL, cT1, cN0, cM0, stage I ? ?HISTORY OF PRESENT ILLNESS::Lisa [name] is a 87 y.o. female who is accompanied by her daughter. she is seen as a courtesy of Dr. Delton Coombes for an opinion concerning radiation therapy as part of management for her recently diagnosed left lung cancer. Prior to her recent lung cancer diagnosis, the patient has been followed by Dr. Delton Coombes for her history of IgG kappa smoldering myeloma. ? ?The patient underwent a PET scan on 08/04/21 which showed a 1.7 x 1.4 cm left upper lobe nodule with an SUV max of 18.  No hypermetabolic mediastinal or hilar adenopathy was appreciated, or evidence of metastatic disease. (PET also did not show any evidence of active multiple myeloma).  ? ?Super D chest CT on 08/26/21 showed the spiculated LUL nodule as unchanged in the interval, measuring 2.1 x 1.6 cm, previously hypermetabolic, and highly concerning for ?primary lung malignancy.  ? ?Subsequently, the patient underwent  bronchoscopy and LUL biopsies under the care of Dr. Roxan Hockey on 09/01/2021 which revealed findings consistent with moderately differentiated adenocarcinoma.  ? ?After bronchoscopy, the patient developed chest pain and SOB and was hospitalized for NSTEMI and hypertensive urgency. Chest x-ray performed for evaluation showed hyperinflation and postsurgical changes in the left upper lobe without any pneumothorax.  Labs showed troponin's 18>>57>>118 and she was started on IV heparin, nicardipine drip, and amlodipine.  She was discharged on  09/06/21.  ? -- CT of the neck performed during admission on 09/04/21 (due to complaints of dysphagia) showed the known massively enlarged thyroid as similar in size and appearance compared to CT from 09/16/2018. A LUL opacity was also appreciated in the location of the previously identified LUL nodule. Opacity surrounding the nodule was noted to have increased since prior CT, possibly suggestive of a post-procedural hemorrhage. No apical pneumothorax was appreciated.  ? ?Following evaluation by Dr. Roxan Hockey, the patient was not felt to be a good surgical candidate. The patient most recently met with Dr. Delton Coombes on 09/12/21 who accordingly recommended SBRT to the LUL lesion.  ? ?Of note: the patient's case has been discussed at the tumor board held on 09/15/21.  ? ?PREVIOUS RADIATION THERAPY: No ? ?PAST MEDICAL HISTORY:  ?Past Medical History:  ?Diagnosis Date  ? Anemia   ? Carotid artery stenosis   ? CKD (chronic kidney disease)   ? COPD (chronic obstructive pulmonary disease) ([place])   ? Dyspnea   ? Due to COPD per patient  ? Goiter diffuse, nontoxic   ? Per patient  ? Hepatitis C   ? Hypertension   ? Monoclonal gammopathy of unknown significance (MGUS)   ? Pyelonephritis 04/06/2014  ? UTI  ? ? ?PAST SURGICAL HISTORY: ?Past Surgical History:  ?Procedure Laterality Date  ? ABDOMINAL HYSTERECTOMY    ? CHOLECYSTECTOMY N/A 09/17/2018  ? Procedure: LAPAROSCOPIC CHOLECYSTECTOMY;  Surgeon: Virl Cagey, MD;  [facility]: [facility];  Service: General;  Laterality: N/A;  ? FUDUCIAL PLACEMENT Left 09/01/2021  ? Procedure: PLACEMENT OF FIDUCIAL MARKERS TIMES FOUR;  Surgeon: Melrose Nakayama, MD;  Location: [facility] [place];  Service: Thoracic;  Laterality: Left;  ? TEE WITHOUT CARDIOVERSION N/A 04/10/2014  ? Procedure: TRANSESOPHAGEAL ECHOCARDIOGRAM (TEE);  Surgeon: [name], MD;  [facility] END[facility]COPY;  Service: Cardiovascular;  Laterality: N/A;  ? VIDEO BRONCHOSCOPY WITH ENDOBRONCHIAL NAVIGATION N/A 09/01/2021  ? Procedure:  VIDEO BRONCHOSCOPY WITH ENDOBRONCHIAL NAVIGATION;  Surgeon: Melrose Nakayama, MD;  Location: [place];  Service: Thoracic;  Laterality: N/A;  ? ? ?FAMILY HISTORY:  ?Family History  ?Problem Relation Age of Onset  ? Hypertension Mother   ? Thyroid disease Mother   ? Hypertension Father   ? Thyroid disease Sister   ? Thyroid disease Brother   ? Thyroid disease Sister   ? ? ?SOCIAL HISTORY:  ?Social History  ? ?Tobacco Use  ? Smoking status: Every Day  ?  Packs/day: 0.25  ?  Types: Cigarettes  ? Smokeless tobacco: Never  ?Vaping Use  ? Vaping Use: Never used  ?Substance Use Topics  ? Alcohol use: No  ? Drug use: No  ? ? ?ALLERGIES: No Known Allergies ? ?MEDICATIONS:  ?Current Outpatient Medications  ?Medication Sig Dispense Refill  ? amLODipine (NORVASC) 10 MG tablet Take 1 tablet (10 mg total) by mouth daily. 30 tablet 2  ? aspirin 81 MG EC tablet Take 81 mg by mouth in the morning.    ? calcium carbonate (OS-CAL - DOSED IN MG OF ELEMENTAL CALCIUM) 1250 (500 Ca) MG tablet Take 1 tablet by mouth daily with breakfast.    ? carvedilol (COREG) 25 MG tablet Take 1 tablet (25 mg total) by mouth 2 (two) times daily with a meal. 60 tablet 2  ? Cholecalciferol (VITAMIN D3) 125 MCG (5000 UT) CAPS Take 5,000 Units by mouth daily.    ? feeding supplement, ENSURE COMPLETE, (ENSURE COMPLETE) LIQD Take 237 mLs by mouth 2 (two) times daily between meals.    ? furosemide (LASIX) 20 MG tablet Take 20 mg by mouth 2 (two) times daily. Morning & afternoon    ? Ipratropium-Albuterol (COMBIVENT) 20-100 MCG/ACT AERS respimat Inhale 1 puff into the lungs every 6 (six) hours as needed for wheezing or shortness of breath.     ? ipratropium-albuterol (DUONEB) 0.5-2.5 (3) MG/3ML SOLN Take 3 mLs by nebulization every 4 (four) hours as needed (wheezing/shortness of breath).    ? isosorbide mononitrate (IMDUR) 30 MG 24 hr tablet Take 1 tablet (30 mg total) by mouth daily. 30 tablet 2  ? nitroGLYCERIN (NITROSTAT) 0.4 MG SL tablet Place 1 tablet  (0.4 mg total) under the tongue every 5 (five) minutes as needed for chest pain. 100 tablet 1  ? ondansetron (ZOFRAN) 4 MG tablet Take 1 tablet (4 mg total) by mouth every 6 (six) hours as needed for nausea. 20 tablet 0  ? ?No current facility-administered medications for this encounter.  ? ? ?REVIEW OF SYSTEMS:  A 10+ POINT REVIEW OF SYSTEMS WAS OBTAINED including neurology, dermatology, psychiatry, cardiac, respiratory, lymph, extremities, GI, GU, musculoskeletal, constitutional, reproductive, HEENT.  She denies any pain within the chest area significant cough or hemoptysis. ?  ?PHYSICAL EXAM:  height is _0  (1.676 m) and weight is 104 lb (47.2 kg). Her temperature is 97.5 ?F (36.4 ?C) (abnormal). Her blood pressure is 108/71 and her pulse is 52 (abnormal). Her respiration is 24 (abnormal) and oxygen saturation is 99%.   ?General: Alert and oriented, in no acute distress, remains in wheelchair for examination. ?HEENT: Head is normocephalic. Extraocular movements are intact.  ?Neck: Neck is supple, no palpable cervical or supraclavicular lymphadenopathy. ?Heart: Regular  in rate and rhythm with no murmurs, rubs, or gallops. ?Chest: Clear to auscultation bilaterally, with no rhonchi, wheezes, or rales. ?Abdomen: Soft, nontender, nondistended, with no rigidity or guarding. ?Extremities: No cyanosis or edema. ?Lymphatics: see Neck Exam ?Skin: No concerning lesions. ?Musculoskeletal: symmetric strength and muscle tone throughout. ?Neurologic: Cranial nerves II through XII are grossly intact. No obvious focalities. Speech is fluent. Coordination is intact. ?Psychiatric: Judgment and insight are intact. Affect is appropriate. ? ? ?ECOG = 1 ? ?0 - Asymptomatic (Fully active, able to carry on all predisease activities without restriction) ? ?1 - Symptomatic but completely ambulatory (Restricted in physically strenuous activity but ambulatory and able to carry out work of a light or sedentary nature. For example, light  housework, office work) ? ?2 - Symptomatic, <50% in bed during the day (Ambulatory and capable of all self care but unable to carry out any work activities. Up and about more than 50% of waking hours) ? ?3 -

## 2021-09-19 ENCOUNTER — Ambulatory Visit
Admission: RE | Admit: 2021-09-19 | Discharge: 2021-09-19 | Disposition: A | Discharge status: Home / Self Care | Payer: Medicare Other | Source: Ambulatory Visit | Attending: Radiation Oncology | Admitting: Radiation Oncology

## 2021-09-19 ENCOUNTER — Encounter: Payer: Self-pay | Admitting: Radiation Oncology

## 2021-09-19 ENCOUNTER — Other Ambulatory Visit: Payer: Self-pay

## 2021-09-19 VITALS — BP 108/71 | HR 52 | Temp 97.5°F | Resp 24 | Ht 66.0 in | Wt 104.0 lb

## 2021-09-19 DIAGNOSIS — N184 Chronic kidney disease, stage 4 (severe): Secondary | ICD-10-CM | POA: Diagnosis not present

## 2021-09-19 DIAGNOSIS — D631 Anemia in chronic kidney disease: Secondary | ICD-10-CM | POA: Diagnosis not present

## 2021-09-19 DIAGNOSIS — I10 Essential (primary) hypertension: Secondary | ICD-10-CM | POA: Diagnosis not present

## 2021-09-19 DIAGNOSIS — B1920 Unspecified viral hepatitis C without hepatic coma: Secondary | ICD-10-CM | POA: Insufficient documentation

## 2021-09-19 DIAGNOSIS — M4312 Spondylolisthesis, cervical region: Secondary | ICD-10-CM | POA: Insufficient documentation

## 2021-09-19 DIAGNOSIS — J189 Pneumonia, unspecified organism: Secondary | ICD-10-CM | POA: Insufficient documentation

## 2021-09-19 DIAGNOSIS — F17210 Nicotine dependence, cigarettes, uncomplicated: Secondary | ICD-10-CM | POA: Diagnosis not present

## 2021-09-19 DIAGNOSIS — D649 Anemia, unspecified: Secondary | ICD-10-CM | POA: Diagnosis not present

## 2021-09-19 DIAGNOSIS — C9000 Multiple myeloma not having achieved remission: Secondary | ICD-10-CM

## 2021-09-19 DIAGNOSIS — D472 Monoclonal gammopathy: Secondary | ICD-10-CM | POA: Insufficient documentation

## 2021-09-19 DIAGNOSIS — I6523 Occlusion and stenosis of bilateral carotid arteries: Secondary | ICD-10-CM | POA: Diagnosis not present

## 2021-09-19 DIAGNOSIS — Z79899 Other long term (current) drug therapy: Secondary | ICD-10-CM | POA: Diagnosis not present

## 2021-09-19 DIAGNOSIS — C3412 Malignant neoplasm of upper lobe, left bronchus or lung: Secondary | ICD-10-CM | POA: Insufficient documentation

## 2021-09-19 DIAGNOSIS — R079 Chest pain, unspecified: Secondary | ICD-10-CM | POA: Insufficient documentation

## 2021-09-19 DIAGNOSIS — J432 Centrilobular emphysema: Secondary | ICD-10-CM | POA: Insufficient documentation

## 2021-09-19 DIAGNOSIS — R0902 Hypoxemia: Secondary | ICD-10-CM | POA: Diagnosis not present

## 2021-09-19 DIAGNOSIS — R590 Localized enlarged lymph nodes: Secondary | ICD-10-CM | POA: Diagnosis not present

## 2021-09-19 DIAGNOSIS — I2510 Atherosclerotic heart disease of native coronary artery without angina pectoris: Secondary | ICD-10-CM | POA: Diagnosis not present

## 2021-09-19 DIAGNOSIS — I252 Old myocardial infarction: Secondary | ICD-10-CM | POA: Diagnosis not present

## 2021-09-19 DIAGNOSIS — J449 Chronic obstructive pulmonary disease, unspecified: Secondary | ICD-10-CM | POA: Insufficient documentation

## 2021-09-19 DIAGNOSIS — M47812 Spondylosis without myelopathy or radiculopathy, cervical region: Secondary | ICD-10-CM | POA: Diagnosis not present

## 2021-09-19 DIAGNOSIS — I160 Hypertensive urgency: Secondary | ICD-10-CM | POA: Diagnosis not present

## 2021-09-19 NOTE — Progress Notes (Signed)
See MD note for nursing evaluation. °

## 2021-09-20 DIAGNOSIS — Z87440 Personal history of urinary (tract) infections: Secondary | ICD-10-CM | POA: Diagnosis not present

## 2021-09-20 DIAGNOSIS — Z9181 History of falling: Secondary | ICD-10-CM | POA: Diagnosis not present

## 2021-09-20 DIAGNOSIS — J449 Chronic obstructive pulmonary disease, unspecified: Secondary | ICD-10-CM | POA: Diagnosis not present

## 2021-09-20 DIAGNOSIS — M4802 Spinal stenosis, cervical region: Secondary | ICD-10-CM | POA: Diagnosis not present

## 2021-09-20 DIAGNOSIS — M4803 Spinal stenosis, cervicothoracic region: Secondary | ICD-10-CM | POA: Diagnosis not present

## 2021-09-20 DIAGNOSIS — I2510 Atherosclerotic heart disease of native coronary artery without angina pectoris: Secondary | ICD-10-CM | POA: Diagnosis not present

## 2021-09-20 DIAGNOSIS — D630 Anemia in neoplastic disease: Secondary | ICD-10-CM | POA: Diagnosis not present

## 2021-09-20 DIAGNOSIS — I160 Hypertensive urgency: Secondary | ICD-10-CM | POA: Diagnosis not present

## 2021-09-20 DIAGNOSIS — D631 Anemia in chronic kidney disease: Secondary | ICD-10-CM | POA: Diagnosis not present

## 2021-09-20 DIAGNOSIS — M47812 Spondylosis without myelopathy or radiculopathy, cervical region: Secondary | ICD-10-CM | POA: Diagnosis not present

## 2021-09-20 DIAGNOSIS — C9000 Multiple myeloma not having achieved remission: Secondary | ICD-10-CM | POA: Diagnosis not present

## 2021-09-20 DIAGNOSIS — M4312 Spondylolisthesis, cervical region: Secondary | ICD-10-CM | POA: Diagnosis not present

## 2021-09-20 DIAGNOSIS — Z7982 Long term (current) use of aspirin: Secondary | ICD-10-CM | POA: Diagnosis not present

## 2021-09-20 DIAGNOSIS — F17210 Nicotine dependence, cigarettes, uncomplicated: Secondary | ICD-10-CM | POA: Diagnosis not present

## 2021-09-20 DIAGNOSIS — R1310 Dysphagia, unspecified: Secondary | ICD-10-CM | POA: Diagnosis not present

## 2021-09-20 DIAGNOSIS — N184 Chronic kidney disease, stage 4 (severe): Secondary | ICD-10-CM | POA: Diagnosis not present

## 2021-09-20 DIAGNOSIS — I1310 Hypertensive heart and chronic kidney disease without heart failure, with stage 1 through stage 4 chronic kidney disease, or unspecified chronic kidney disease: Secondary | ICD-10-CM | POA: Diagnosis not present

## 2021-09-20 DIAGNOSIS — R918 Other nonspecific abnormal finding of lung field: Secondary | ICD-10-CM | POA: Diagnosis not present

## 2021-09-20 DIAGNOSIS — I700 Atherosclerosis of aorta: Secondary | ICD-10-CM | POA: Diagnosis not present

## 2021-09-21 ENCOUNTER — Encounter (HOSPITAL_COMMUNITY): Payer: Self-pay | Admitting: Licensed Clinical Social Worker

## 2021-09-21 DIAGNOSIS — D631 Anemia in chronic kidney disease: Secondary | ICD-10-CM | POA: Diagnosis not present

## 2021-09-21 DIAGNOSIS — C9000 Multiple myeloma not having achieved remission: Secondary | ICD-10-CM | POA: Diagnosis not present

## 2021-09-21 DIAGNOSIS — D630 Anemia in neoplastic disease: Secondary | ICD-10-CM | POA: Diagnosis not present

## 2021-09-21 DIAGNOSIS — I2510 Atherosclerotic heart disease of native coronary artery without angina pectoris: Secondary | ICD-10-CM | POA: Diagnosis not present

## 2021-09-21 DIAGNOSIS — J449 Chronic obstructive pulmonary disease, unspecified: Secondary | ICD-10-CM | POA: Diagnosis not present

## 2021-09-21 DIAGNOSIS — R918 Other nonspecific abnormal finding of lung field: Secondary | ICD-10-CM

## 2021-09-21 DIAGNOSIS — Z9181 History of falling: Secondary | ICD-10-CM | POA: Diagnosis not present

## 2021-09-21 DIAGNOSIS — Z87440 Personal history of urinary (tract) infections: Secondary | ICD-10-CM | POA: Diagnosis not present

## 2021-09-21 DIAGNOSIS — M4803 Spinal stenosis, cervicothoracic region: Secondary | ICD-10-CM | POA: Diagnosis not present

## 2021-09-21 DIAGNOSIS — N184 Chronic kidney disease, stage 4 (severe): Secondary | ICD-10-CM | POA: Diagnosis not present

## 2021-09-21 DIAGNOSIS — F17210 Nicotine dependence, cigarettes, uncomplicated: Secondary | ICD-10-CM | POA: Diagnosis not present

## 2021-09-21 DIAGNOSIS — I160 Hypertensive urgency: Secondary | ICD-10-CM | POA: Diagnosis not present

## 2021-09-21 DIAGNOSIS — Z7982 Long term (current) use of aspirin: Secondary | ICD-10-CM | POA: Diagnosis not present

## 2021-09-21 DIAGNOSIS — R1310 Dysphagia, unspecified: Secondary | ICD-10-CM | POA: Diagnosis not present

## 2021-09-21 DIAGNOSIS — I1310 Hypertensive heart and chronic kidney disease without heart failure, with stage 1 through stage 4 chronic kidney disease, or unspecified chronic kidney disease: Secondary | ICD-10-CM | POA: Diagnosis not present

## 2021-09-21 DIAGNOSIS — M47812 Spondylosis without myelopathy or radiculopathy, cervical region: Secondary | ICD-10-CM | POA: Diagnosis not present

## 2021-09-21 DIAGNOSIS — I700 Atherosclerosis of aorta: Secondary | ICD-10-CM | POA: Diagnosis not present

## 2021-09-21 DIAGNOSIS — M4312 Spondylolisthesis, cervical region: Secondary | ICD-10-CM | POA: Diagnosis not present

## 2021-09-21 DIAGNOSIS — M4802 Spinal stenosis, cervical region: Secondary | ICD-10-CM | POA: Diagnosis not present

## 2021-09-21 NOTE — Progress Notes (Signed)
Pt resides alone and is receiving palliative radiation for lung cancer dx.  Per niece pt utilizes a walker/cane for ambulation; however, her coordination waxes and wanes making it unsafe at times for her to be alone.  Family actively working on obtaining full time care in the home. ?

## 2021-09-21 NOTE — Progress Notes (Signed)
[place] Clinical Social Work  ?Initial Assessment ? ? ?Lisa [name] is a 87 y.o. year old female who resides alone.  Clinical Social Worker contacted pt's niece Allene Pyo [phone]) to obtain details for assessment. Clinical Social Work was referred by medical provider for assessment of psychosocial needs.  ? ?SDOH (Social Determinants of Health) assessments performed: Yes ?  ?Distress Screen completed: No ? ?  09/19/2021  ?  8:01 AM  ?ONCBCN DISTRESS SCREENING  ?Screening Type Initial Screening  ?Distress experienced in past week (1-10) 6  ?Practical problem type Housing;Food  ?Family Problem type Other (comment)  ?Emotional problem type Nervousness/Anxiety;Adjusting to illness;Boredom;Adjusting to appearance changes  ?Spiritual/Religous concerns type Facing my mortality;Loss of sense of purpose  ?Information Concerns Type Lack of info about diagnosis;Lack of info about treatment  ?Physical Problem type Getting around;Bathing/dressing;Breathing;Loss of appetitie;Talking;Changes in urination;Skin dry/itchy;Swollen arms/legs  ?Referral to clinical social work Yes  ? ? ? ? ?Family/Social Information:  ?Housing Arrangement: patient lives alonewith a niece, son and 2 nephews residing close by who provide support as they are able. ?Family members/support persons in your life? Family ?Transportation concerns: at present no transportation issues identified.  ?Employment: Retired. Income source: [employer] ?Financial concerns: Yes, due to illness and/or loss of work during treatment ?Type of concern:  full time custodial care ?Food access concerns: pt recently discharged from hospital and was signed up for [employer] which she will receive for 2 weeks.  Pt's family takes turns delivering food for pt or cooking for pt. ?Religious or spiritual practice: not identified ?Services Currently in place:  [facility] and [facility] ? ?Coping/ Adjustment to diagnosis: ?Patient understands treatment  plan and what happens next? yes ?Concerns about diagnosis and/or treatment:  prognosis ?Patient reported stressors: Physical issues ?Hopes and priorities: priority is to start radiation with the hope of regaining some independence ?Patient enjoys  not identified ?Current coping skills/ strengths: Supportive family/friends  ? ? ? SUMMARY: ?Current SDOH Barriers:  ?Financial constraints related to hiring full time custodial care. ? ?Clinical Social Work Clinical Goal(s):  ?explore Data processing manager options for unmet needs related to:  Financial Strain  ? ?Interventions: ?Discussed common feeling and emotions when being diagnosed with cancer, and the importance of support during treatment ?Informed patient of the support team roles and support services at [facility] ?Provided CSW contact information and encouraged patient to call with any questions or concerns ?Referred patient to Duanne Limerick for additional resources.  CSW discussed transition from home care to palliative home care for additional time w/ services.  CSW also recommended pt's family reach out to patient financial resources to apply for financial assistance. ? ? ?Follow Up Plan: Patient will contact CSW with any support or resource needs ?Patient verbalizes understanding of plan: Yes ? ? ? ?Henriette Combs, LCSW ?

## 2021-09-21 NOTE — Progress Notes (Signed)
Per niece pt applied for and does not qualify for Medicaid.  Pt will need full time care and at present does not have the financial resources to pay for it.  ?

## 2021-09-23 DIAGNOSIS — I2510 Atherosclerotic heart disease of native coronary artery without angina pectoris: Secondary | ICD-10-CM | POA: Diagnosis not present

## 2021-09-23 DIAGNOSIS — J449 Chronic obstructive pulmonary disease, unspecified: Secondary | ICD-10-CM | POA: Diagnosis not present

## 2021-09-23 DIAGNOSIS — Z7982 Long term (current) use of aspirin: Secondary | ICD-10-CM | POA: Diagnosis not present

## 2021-09-23 DIAGNOSIS — I700 Atherosclerosis of aorta: Secondary | ICD-10-CM | POA: Diagnosis not present

## 2021-09-23 DIAGNOSIS — I1310 Hypertensive heart and chronic kidney disease without heart failure, with stage 1 through stage 4 chronic kidney disease, or unspecified chronic kidney disease: Secondary | ICD-10-CM | POA: Diagnosis not present

## 2021-09-23 DIAGNOSIS — N184 Chronic kidney disease, stage 4 (severe): Secondary | ICD-10-CM | POA: Diagnosis not present

## 2021-09-23 DIAGNOSIS — C9000 Multiple myeloma not having achieved remission: Secondary | ICD-10-CM | POA: Diagnosis not present

## 2021-09-23 DIAGNOSIS — M4803 Spinal stenosis, cervicothoracic region: Secondary | ICD-10-CM | POA: Diagnosis not present

## 2021-09-23 DIAGNOSIS — R918 Other nonspecific abnormal finding of lung field: Secondary | ICD-10-CM | POA: Diagnosis not present

## 2021-09-23 DIAGNOSIS — M47812 Spondylosis without myelopathy or radiculopathy, cervical region: Secondary | ICD-10-CM | POA: Diagnosis not present

## 2021-09-23 DIAGNOSIS — M4312 Spondylolisthesis, cervical region: Secondary | ICD-10-CM | POA: Diagnosis not present

## 2021-09-23 DIAGNOSIS — M4802 Spinal stenosis, cervical region: Secondary | ICD-10-CM | POA: Diagnosis not present

## 2021-09-23 DIAGNOSIS — Z87440 Personal history of urinary (tract) infections: Secondary | ICD-10-CM | POA: Diagnosis not present

## 2021-09-23 DIAGNOSIS — Z9181 History of falling: Secondary | ICD-10-CM | POA: Diagnosis not present

## 2021-09-23 DIAGNOSIS — D630 Anemia in neoplastic disease: Secondary | ICD-10-CM | POA: Diagnosis not present

## 2021-09-23 DIAGNOSIS — I160 Hypertensive urgency: Secondary | ICD-10-CM | POA: Diagnosis not present

## 2021-09-23 DIAGNOSIS — D631 Anemia in chronic kidney disease: Secondary | ICD-10-CM | POA: Diagnosis not present

## 2021-09-23 DIAGNOSIS — F17210 Nicotine dependence, cigarettes, uncomplicated: Secondary | ICD-10-CM | POA: Diagnosis not present

## 2021-09-23 DIAGNOSIS — R1310 Dysphagia, unspecified: Secondary | ICD-10-CM | POA: Diagnosis not present

## 2021-09-26 DIAGNOSIS — M47812 Spondylosis without myelopathy or radiculopathy, cervical region: Secondary | ICD-10-CM | POA: Diagnosis not present

## 2021-09-26 DIAGNOSIS — M4312 Spondylolisthesis, cervical region: Secondary | ICD-10-CM | POA: Diagnosis not present

## 2021-09-26 DIAGNOSIS — J449 Chronic obstructive pulmonary disease, unspecified: Secondary | ICD-10-CM | POA: Diagnosis not present

## 2021-09-26 DIAGNOSIS — N184 Chronic kidney disease, stage 4 (severe): Secondary | ICD-10-CM | POA: Diagnosis not present

## 2021-09-26 DIAGNOSIS — M4803 Spinal stenosis, cervicothoracic region: Secondary | ICD-10-CM | POA: Diagnosis not present

## 2021-09-26 DIAGNOSIS — C9000 Multiple myeloma not having achieved remission: Secondary | ICD-10-CM | POA: Diagnosis not present

## 2021-09-26 DIAGNOSIS — M4802 Spinal stenosis, cervical region: Secondary | ICD-10-CM | POA: Diagnosis not present

## 2021-09-26 DIAGNOSIS — Z9181 History of falling: Secondary | ICD-10-CM | POA: Diagnosis not present

## 2021-09-26 DIAGNOSIS — R1310 Dysphagia, unspecified: Secondary | ICD-10-CM | POA: Diagnosis not present

## 2021-09-26 DIAGNOSIS — Z7982 Long term (current) use of aspirin: Secondary | ICD-10-CM | POA: Diagnosis not present

## 2021-09-26 DIAGNOSIS — F17210 Nicotine dependence, cigarettes, uncomplicated: Secondary | ICD-10-CM | POA: Diagnosis not present

## 2021-09-26 DIAGNOSIS — D630 Anemia in neoplastic disease: Secondary | ICD-10-CM | POA: Diagnosis not present

## 2021-09-26 DIAGNOSIS — I1310 Hypertensive heart and chronic kidney disease without heart failure, with stage 1 through stage 4 chronic kidney disease, or unspecified chronic kidney disease: Secondary | ICD-10-CM | POA: Diagnosis not present

## 2021-09-26 DIAGNOSIS — D631 Anemia in chronic kidney disease: Secondary | ICD-10-CM | POA: Diagnosis not present

## 2021-09-26 DIAGNOSIS — I2510 Atherosclerotic heart disease of native coronary artery without angina pectoris: Secondary | ICD-10-CM | POA: Diagnosis not present

## 2021-09-26 DIAGNOSIS — I160 Hypertensive urgency: Secondary | ICD-10-CM | POA: Diagnosis not present

## 2021-09-26 DIAGNOSIS — Z87440 Personal history of urinary (tract) infections: Secondary | ICD-10-CM | POA: Diagnosis not present

## 2021-09-26 DIAGNOSIS — I700 Atherosclerosis of aorta: Secondary | ICD-10-CM | POA: Diagnosis not present

## 2021-09-26 DIAGNOSIS — R918 Other nonspecific abnormal finding of lung field: Secondary | ICD-10-CM | POA: Diagnosis not present

## 2021-09-26 NOTE — Progress Notes (Signed)
? ? ?Office Visit  ?  ?Patient Name: Lisa [name] ?Date of Encounter: 09/27/2021 ? ?Primary Care Provider:  Carrolyn Meiers, MD ?Primary Cardiologist:  Donato Heinz, MD ?Primary Electrophysiologist: None ?Chief Complaint  ?  ?Post hospital follow-up for hypertensive urgency and NSTEMI ? ? Patient Profile: ?COPD ?Chronic kidney stage IV ?Hypertensive urgency ?Tobacco abuse ?Atypical chest pain ?Lung cancer ? ? Recent Studies: ?3/23 TTE: EF 70-75%, no RWMA, moderate LVH with grade 2 DD MV is degenerative no evidence of regurgitation or stenosis, LA is mildly dilated, RA is normal size.  No evidence of pericardial effusion ?5/22 carotid US: right ICA are consistent with a 1-39%          stenosis,Velocities in the left ICA are consistent with a 60-79% stenosis. Hemodynamically significant plaque >50% visualized in the CCA. Compared to prior study, degree of ?stenosis has increased ? ?History of Present Illness  ?  ?Lisa [name] is a 87 y.o. female with PMH of COPD, HTN, tobacco abuse, CKD stage IV, smoldering myeloma.  [facility] on 3/23 with complaint of chest pain.  Chest pain was described as burning and aching similar to heartburn.  Patient was recently evaluated for MGUS and underwent PET/CT which showed 1.7 cm nodule in left upper lobe.  Patient was deemed nonsurgical candidate for resection.  EKG showed sinus rhythm with rate of 81 LVH with repolarization abnormalities.  Her troponins ranged from 18>57>117.  Echo completed 06/3021 which showed biventricular function and severe left atrial enlargement, with grade 2 diastolic dysfunction with no significant valve disease.  Blood pressures were elevated elevated significantly on arrival to ED ranging from 222L to 798X systolically over 21J and 60s.  Nicardipine started to manage elevated blood pressure but was discontinued due to bradycardia.  Cardizem was discontinued and patient was started on  amlodipine.Patient was discharged on 3/28 with new antihypertensive medications requiring titration.Due to patient's age and comorbidities ischemic evaluation not recommended and medical management was proposed. ? ?[name] was discharged on 3/28 with new antihypertensive medications requiring titration.  She presents today for follow-up with her husband today and reports that she is not having any current chest pain or shortness of breath.  She is tolerating her new medications and blood pressure today was 113/49.  She does state that she gets a little dizzy with standing suddenly and this resolves fairly quickly.  She is euvolemic on examination today and does have some trace edema that does not extend past her ankles.  She has home health aide that sees her daily and checks her blood pressures.  I advised her to have those blood pressures written down and to present them to follow-up in 4 months. She denies chest pain, palpitations, dyspnea, PND, orthopnea, nausea, vomiting, dizziness, syncope, edema, weight gain, or early satiety. ? ? ?Past Medical History  ?  ?Past Medical History:  ?Diagnosis Date  ? Anemia   ? Carotid artery stenosis   ? CKD (chronic kidney disease)   ? COPD (chronic obstructive pulmonary disease) ([place])   ? Dyspnea   ? Due to COPD per patient  ? Goiter diffuse, nontoxic   ? Per patient  ? Hepatitis C   ? Hypertension   ? Monoclonal gammopathy of unknown significance (MGUS)   ? Pyelonephritis 04/06/2014  ? UTI  ? ?Past Surgical History:  ?Procedure Laterality Date  ? ABDOMINAL HYSTERECTOMY    ? CHOLECYSTECTOMY N/A 09/17/2018  ? Procedure: LAPAROSCOPIC CHOLECYSTECTOMY;  Surgeon: Virl Cagey, MD;  Location: [facility];  Service: General;  Laterality: N/A;  ? FUDUCIAL PLACEMENT Left 09/01/2021  ? Procedure: PLACEMENT OF FIDUCIAL MARKERS TIMES FOUR;  Surgeon: Melrose Nakayama, MD;  Location: [place];  Service: Thoracic;  Laterality: Left;  ? TEE WITHOUT CARDIOVERSION N/A 04/10/2014  ?  Procedure: TRANSESOPHAGEAL ECHOCARDIOGRAM (TEE);  Surgeon: [name], MD;  Location: [facility] ENDOSCOPY;  Service: Cardiovascular;  Laterality: N/A;  ? VIDEO BRONCHOSCOPY WITH ENDOBRONCHIAL NAVIGATION N/A 09/01/2021  ? Procedure: VIDEO BRONCHOSCOPY WITH ENDOBRONCHIAL NAVIGATION;  Surgeon: Melrose Nakayama, MD;  Location: [place];  Service: Thoracic;  Laterality: N/A;  ? ? ?Allergies ? ?No Known Allergies ? ?Home Medications  ?  ?Current Outpatient Medications  ?Medication Sig Dispense Refill  ? amLODipine (NORVASC) 10 MG tablet Take 1 tablet (10 mg total) by mouth daily. 30 tablet 2  ? aspirin 81 MG EC tablet Take 81 mg by mouth in the morning.    ? calcium carbonate (OS-CAL - DOSED IN MG OF ELEMENTAL CALCIUM) 1250 (500 Ca) MG tablet Take 1 tablet by mouth daily with breakfast.    ? carvedilol (COREG) 25 MG tablet Take 1 tablet (25 mg total) by mouth 2 (two) times daily with a meal. 60 tablet 2  ? Cholecalciferol (VITAMIN D3) 125 MCG (5000 UT) CAPS Take 5,000 Units by mouth daily.    ? feeding supplement, ENSURE COMPLETE, (ENSURE COMPLETE) LIQD Take 237 mLs by mouth 2 (two) times daily between meals.    ? furosemide (LASIX) 20 MG tablet Take 20 mg by mouth 2 (two) times daily. Morning & afternoon    ? Ipratropium-Albuterol (COMBIVENT) 20-100 MCG/ACT AERS respimat Inhale 1 puff into the lungs every 6 (six) hours as needed for wheezing or shortness of breath.     ? ipratropium-albuterol (DUONEB) 0.5-2.5 (3) MG/3ML SOLN Take 3 mLs by nebulization every 4 (four) hours as needed (wheezing/shortness of breath).    ? isosorbide mononitrate (IMDUR) 30 MG 24 hr tablet Take 1 tablet (30 mg total) by mouth daily. 30 tablet 2  ? nitroGLYCERIN (NITROSTAT) 0.4 MG SL tablet Place 1 tablet (0.4 mg total) under the tongue every 5 (five) minutes as needed for chest pain. 100 tablet 1  ? ondansetron (ZOFRAN) 4 MG tablet Take 1 tablet (4 mg total) by mouth every 6 (six) hours as needed for nausea. 20 tablet 0  ? ?No current  facility-administered medications for this visit.  ?  ? ?Review of Systems  ?Please see the history of present illness.    ? ?All other systems reviewed and are otherwise negative except as noted above. ? ?Physical Exam  ?  ?Wt Readings from Last 3 Encounters:  ?09/27/21 105 lb (47.6 kg)  ?09/19/21 104 lb (47.2 kg)  ?09/12/21 102 lb 1.2 oz (46.3 kg)  ? ?VS: ?Vitals:  ? 09/27/21 1411  ?BP: (!) 113/49  ?Pulse: 63  ?,Body mass index is 16.95 kg/m?. ? ?Constitutional:   ?   Appearance: Not in distress.  ?Neck:  ?   Vascular: JVD normal,+ goiter ?Pulmonary:  ?   Effort: Pulmonary effort is normal.  ?   Breath sounds: No wheezing. No rales diminished in the bases ?Cardiovascular:  ?   Normal rate. Regular rhythm. Normal S1. Normal S2.   ?   Murmurs: There is no murmur.  ?Edema: ?   Trace peripheral edema ?Abdominal:  ?   Palpations: Abdomen is soft. There is no hepatomegaly.  ?Skin: ?   General: Skin  is warm and dry.  ?Neurological:  ?   General: No focal deficit present.  ?   Mental Status: Alert and oriented to person, place and time.  ?   Cranial Nerves: Cranial nerves are intact.  ?EKG/LABS/Other Studies Reviewed  ?  ?ECG personally reviewed by me today -none completed today  ? ?Lab Results  ?Component Value Date  ? WBC 3.8 (L) 09/06/2021  ? HGB 8.6 (L) 09/06/2021  ? HCT 26.3 (L) 09/06/2021  ? MCV 93.3 09/06/2021  ? PLT 92 (L) 09/06/2021  ? ?Lab Results  ?Component Value Date  ? CREATININE 1.89 (H) 09/06/2021  ? BUN 86 (H) 09/06/2021  ? NA 137 09/06/2021  ? K 4.2 09/06/2021  ? CL 102 09/06/2021  ? CO2 29 09/06/2021  ? ?Lab Results  ?Component Value Date  ? ALT 17 08/30/2021  ? AST 27 08/30/2021  ? ALKPHOS 47 08/30/2021  ? BILITOT 0.6 08/30/2021  ? ?Lab Results  ?Component Value Date  ? CHOL 166 09/14/2018  ? HDL 59 09/14/2018  ? [place] 87 09/14/2018  ? TRIG 102 09/14/2018  ? CHOLHDL 2.8 09/14/2018  ?  ?No results found for: HGBA1C ? ?Assessment & Plan  ?  ?1.  Hypertensive urgency: ?-Patient's initial blood pressure  in ED was 220/88 and Cardene drip was initiated but discontinued due to bradycardia. ?-Continue Imdur, Coreg, and amlodipine ?-Patient's blood pressure today was 113/49 ? ?2.  Atypical chest pain: ?-Patient reports no chest pain and denie

## 2021-09-27 ENCOUNTER — Other Ambulatory Visit: Payer: Self-pay

## 2021-09-27 ENCOUNTER — Ambulatory Visit: Payer: Medicare Other | Admitting: General Practice

## 2021-09-27 ENCOUNTER — Encounter: Payer: Self-pay | Admitting: General Practice

## 2021-09-27 ENCOUNTER — Ambulatory Visit
Admission: RE | Admit: 2021-09-27 | Discharge: 2021-09-27 | Disposition: A | Discharge status: Home / Self Care | Payer: Medicare Other | Source: Ambulatory Visit | Attending: Radiation Oncology | Admitting: Radiation Oncology

## 2021-09-27 VITALS — BP 113/49 | HR 63 | Wt 105.0 lb

## 2021-09-27 DIAGNOSIS — F17210 Nicotine dependence, cigarettes, uncomplicated: Secondary | ICD-10-CM | POA: Diagnosis not present

## 2021-09-27 DIAGNOSIS — R0789 Other chest pain: Secondary | ICD-10-CM | POA: Diagnosis not present

## 2021-09-27 DIAGNOSIS — C3412 Malignant neoplasm of upper lobe, left bronchus or lung: Secondary | ICD-10-CM | POA: Insufficient documentation

## 2021-09-27 DIAGNOSIS — D631 Anemia in chronic kidney disease: Secondary | ICD-10-CM

## 2021-09-27 DIAGNOSIS — C3492 Malignant neoplasm of unspecified part of left bronchus or lung: Secondary | ICD-10-CM

## 2021-09-27 DIAGNOSIS — I160 Hypertensive urgency: Secondary | ICD-10-CM

## 2021-09-27 DIAGNOSIS — J438 Other emphysema: Secondary | ICD-10-CM

## 2021-09-27 DIAGNOSIS — N184 Chronic kidney disease, stage 4 (severe): Secondary | ICD-10-CM | POA: Diagnosis not present

## 2021-09-27 DIAGNOSIS — Z510 Encounter for antineoplastic radiation therapy: Secondary | ICD-10-CM | POA: Diagnosis not present

## 2021-09-27 NOTE — Patient Instructions (Signed)
Medication Instructions:  ?The current medical regimen is effective;  continue present plan and medications as directed. Please refer to the Current Medication list given to you today.  ? ?*If you need a refill on your cardiac medications before your next appointment, please call your pharmacy* ? ?Lab Work:   Testing/Procedures:  ?NONE    NONE ? ?If you have labs (blood work) drawn today and your tests are completely normal, you will receive your results only by: ?MyChart Message (if you have MyChart) OR  A paper copy in the mail ?If you have any lab test that is abnormal or we need to change your treatment, we will call you to review the results. ? ?Follow-Up: ?Your next appointment:  4 month(s) In Person with You may see PT WOULD LIKE TO SWITCH TO [place] OFFICE or one of the following Advanced Practice Providers on your designated Care Team:   ?Bernerd Pho, PA-C  ?Ermalinda Barrios, PA-C   ? ?**PENDING MD APPROVAL ? ?At [facility], you and your health needs are our priority.  As part of our continuing mission to provide you with exceptional heart care, we have created designated Provider Care Teams.  These Care Teams include your primary Cardiologist (physician) and Advanced Practice Providers (APPs -  Physician Assistants and Nurse Practitioners) who all work together to provide you with the care you need, when you need it. ? ?We recommend signing up for the patient portal called "MyChart".  Sign up information is provided on this After Visit Summary.  MyChart is used to connect with patients for Virtual Visits (Telemedicine).  Patients are able to view lab/test results, encounter notes, upcoming appointments, etc.  Non-urgent messages can be sent to your provider as well.   ?To learn more about what you can do with MyChart, go to [email or web address].   ? ? ?Important Information About Sugar ? ? ? ? ? ? ?       ?

## 2021-09-29 DIAGNOSIS — C9000 Multiple myeloma not having achieved remission: Secondary | ICD-10-CM | POA: Diagnosis not present

## 2021-09-29 DIAGNOSIS — D630 Anemia in neoplastic disease: Secondary | ICD-10-CM | POA: Diagnosis not present

## 2021-09-29 DIAGNOSIS — J449 Chronic obstructive pulmonary disease, unspecified: Secondary | ICD-10-CM | POA: Diagnosis not present

## 2021-09-29 DIAGNOSIS — M4803 Spinal stenosis, cervicothoracic region: Secondary | ICD-10-CM | POA: Diagnosis not present

## 2021-09-29 DIAGNOSIS — Z87440 Personal history of urinary (tract) infections: Secondary | ICD-10-CM | POA: Diagnosis not present

## 2021-09-29 DIAGNOSIS — I160 Hypertensive urgency: Secondary | ICD-10-CM | POA: Diagnosis not present

## 2021-09-29 DIAGNOSIS — I2510 Atherosclerotic heart disease of native coronary artery without angina pectoris: Secondary | ICD-10-CM | POA: Diagnosis not present

## 2021-09-29 DIAGNOSIS — I700 Atherosclerosis of aorta: Secondary | ICD-10-CM | POA: Diagnosis not present

## 2021-09-29 DIAGNOSIS — F17210 Nicotine dependence, cigarettes, uncomplicated: Secondary | ICD-10-CM | POA: Diagnosis not present

## 2021-09-29 DIAGNOSIS — M47812 Spondylosis without myelopathy or radiculopathy, cervical region: Secondary | ICD-10-CM | POA: Diagnosis not present

## 2021-09-29 DIAGNOSIS — R1310 Dysphagia, unspecified: Secondary | ICD-10-CM | POA: Diagnosis not present

## 2021-09-29 DIAGNOSIS — N184 Chronic kidney disease, stage 4 (severe): Secondary | ICD-10-CM | POA: Diagnosis not present

## 2021-09-29 DIAGNOSIS — R918 Other nonspecific abnormal finding of lung field: Secondary | ICD-10-CM | POA: Diagnosis not present

## 2021-09-29 DIAGNOSIS — D631 Anemia in chronic kidney disease: Secondary | ICD-10-CM | POA: Diagnosis not present

## 2021-09-29 DIAGNOSIS — Z9181 History of falling: Secondary | ICD-10-CM | POA: Diagnosis not present

## 2021-09-29 DIAGNOSIS — M4312 Spondylolisthesis, cervical region: Secondary | ICD-10-CM | POA: Diagnosis not present

## 2021-09-29 DIAGNOSIS — M4802 Spinal stenosis, cervical region: Secondary | ICD-10-CM | POA: Diagnosis not present

## 2021-09-29 DIAGNOSIS — Z7982 Long term (current) use of aspirin: Secondary | ICD-10-CM | POA: Diagnosis not present

## 2021-09-29 DIAGNOSIS — I1310 Hypertensive heart and chronic kidney disease without heart failure, with stage 1 through stage 4 chronic kidney disease, or unspecified chronic kidney disease: Secondary | ICD-10-CM | POA: Diagnosis not present

## 2021-10-03 DIAGNOSIS — C3412 Malignant neoplasm of upper lobe, left bronchus or lung: Secondary | ICD-10-CM | POA: Diagnosis not present

## 2021-10-03 DIAGNOSIS — F17210 Nicotine dependence, cigarettes, uncomplicated: Secondary | ICD-10-CM | POA: Diagnosis not present

## 2021-10-04 DIAGNOSIS — F17210 Nicotine dependence, cigarettes, uncomplicated: Secondary | ICD-10-CM | POA: Diagnosis not present

## 2021-10-04 DIAGNOSIS — Z510 Encounter for antineoplastic radiation therapy: Secondary | ICD-10-CM | POA: Diagnosis not present

## 2021-10-04 DIAGNOSIS — C3412 Malignant neoplasm of upper lobe, left bronchus or lung: Secondary | ICD-10-CM | POA: Diagnosis not present

## 2021-10-05 DIAGNOSIS — Z87440 Personal history of urinary (tract) infections: Secondary | ICD-10-CM | POA: Diagnosis not present

## 2021-10-05 DIAGNOSIS — M4803 Spinal stenosis, cervicothoracic region: Secondary | ICD-10-CM | POA: Diagnosis not present

## 2021-10-05 DIAGNOSIS — N184 Chronic kidney disease, stage 4 (severe): Secondary | ICD-10-CM | POA: Diagnosis not present

## 2021-10-05 DIAGNOSIS — I1310 Hypertensive heart and chronic kidney disease without heart failure, with stage 1 through stage 4 chronic kidney disease, or unspecified chronic kidney disease: Secondary | ICD-10-CM | POA: Diagnosis not present

## 2021-10-05 DIAGNOSIS — Z9181 History of falling: Secondary | ICD-10-CM | POA: Diagnosis not present

## 2021-10-05 DIAGNOSIS — M4802 Spinal stenosis, cervical region: Secondary | ICD-10-CM | POA: Diagnosis not present

## 2021-10-05 DIAGNOSIS — M47812 Spondylosis without myelopathy or radiculopathy, cervical region: Secondary | ICD-10-CM | POA: Diagnosis not present

## 2021-10-05 DIAGNOSIS — R918 Other nonspecific abnormal finding of lung field: Secondary | ICD-10-CM | POA: Diagnosis not present

## 2021-10-05 DIAGNOSIS — C9000 Multiple myeloma not having achieved remission: Secondary | ICD-10-CM | POA: Diagnosis not present

## 2021-10-05 DIAGNOSIS — M4312 Spondylolisthesis, cervical region: Secondary | ICD-10-CM | POA: Diagnosis not present

## 2021-10-05 DIAGNOSIS — F17210 Nicotine dependence, cigarettes, uncomplicated: Secondary | ICD-10-CM | POA: Diagnosis not present

## 2021-10-05 DIAGNOSIS — I2510 Atherosclerotic heart disease of native coronary artery without angina pectoris: Secondary | ICD-10-CM | POA: Diagnosis not present

## 2021-10-05 DIAGNOSIS — Z7982 Long term (current) use of aspirin: Secondary | ICD-10-CM | POA: Diagnosis not present

## 2021-10-05 DIAGNOSIS — J449 Chronic obstructive pulmonary disease, unspecified: Secondary | ICD-10-CM | POA: Diagnosis not present

## 2021-10-05 DIAGNOSIS — D631 Anemia in chronic kidney disease: Secondary | ICD-10-CM | POA: Diagnosis not present

## 2021-10-05 DIAGNOSIS — R1310 Dysphagia, unspecified: Secondary | ICD-10-CM | POA: Diagnosis not present

## 2021-10-05 DIAGNOSIS — D630 Anemia in neoplastic disease: Secondary | ICD-10-CM | POA: Diagnosis not present

## 2021-10-05 DIAGNOSIS — I700 Atherosclerosis of aorta: Secondary | ICD-10-CM | POA: Diagnosis not present

## 2021-10-05 DIAGNOSIS — I160 Hypertensive urgency: Secondary | ICD-10-CM | POA: Diagnosis not present

## 2021-10-06 DIAGNOSIS — I1310 Hypertensive heart and chronic kidney disease without heart failure, with stage 1 through stage 4 chronic kidney disease, or unspecified chronic kidney disease: Secondary | ICD-10-CM | POA: Diagnosis not present

## 2021-10-06 DIAGNOSIS — M4312 Spondylolisthesis, cervical region: Secondary | ICD-10-CM | POA: Diagnosis not present

## 2021-10-06 DIAGNOSIS — I2510 Atherosclerotic heart disease of native coronary artery without angina pectoris: Secondary | ICD-10-CM | POA: Diagnosis not present

## 2021-10-06 DIAGNOSIS — M4803 Spinal stenosis, cervicothoracic region: Secondary | ICD-10-CM | POA: Diagnosis not present

## 2021-10-06 DIAGNOSIS — I700 Atherosclerosis of aorta: Secondary | ICD-10-CM | POA: Diagnosis not present

## 2021-10-06 DIAGNOSIS — R1310 Dysphagia, unspecified: Secondary | ICD-10-CM | POA: Diagnosis not present

## 2021-10-06 DIAGNOSIS — C9000 Multiple myeloma not having achieved remission: Secondary | ICD-10-CM | POA: Diagnosis not present

## 2021-10-06 DIAGNOSIS — R918 Other nonspecific abnormal finding of lung field: Secondary | ICD-10-CM | POA: Diagnosis not present

## 2021-10-06 DIAGNOSIS — D630 Anemia in neoplastic disease: Secondary | ICD-10-CM | POA: Diagnosis not present

## 2021-10-06 DIAGNOSIS — Z87440 Personal history of urinary (tract) infections: Secondary | ICD-10-CM | POA: Diagnosis not present

## 2021-10-06 DIAGNOSIS — F17210 Nicotine dependence, cigarettes, uncomplicated: Secondary | ICD-10-CM | POA: Diagnosis not present

## 2021-10-06 DIAGNOSIS — Z7982 Long term (current) use of aspirin: Secondary | ICD-10-CM | POA: Diagnosis not present

## 2021-10-06 DIAGNOSIS — D631 Anemia in chronic kidney disease: Secondary | ICD-10-CM | POA: Diagnosis not present

## 2021-10-06 DIAGNOSIS — I160 Hypertensive urgency: Secondary | ICD-10-CM | POA: Diagnosis not present

## 2021-10-06 DIAGNOSIS — M47812 Spondylosis without myelopathy or radiculopathy, cervical region: Secondary | ICD-10-CM | POA: Diagnosis not present

## 2021-10-06 DIAGNOSIS — Z9181 History of falling: Secondary | ICD-10-CM | POA: Diagnosis not present

## 2021-10-06 DIAGNOSIS — N184 Chronic kidney disease, stage 4 (severe): Secondary | ICD-10-CM | POA: Diagnosis not present

## 2021-10-06 DIAGNOSIS — M4802 Spinal stenosis, cervical region: Secondary | ICD-10-CM | POA: Diagnosis not present

## 2021-10-06 DIAGNOSIS — J449 Chronic obstructive pulmonary disease, unspecified: Secondary | ICD-10-CM | POA: Diagnosis not present

## 2021-10-10 ENCOUNTER — Other Ambulatory Visit: Payer: Self-pay

## 2021-10-10 ENCOUNTER — Ambulatory Visit
Admission: RE | Admit: 2021-10-10 | Discharge: 2021-10-10 | Disposition: A | Discharge status: Home / Self Care | Payer: Medicare Other | Source: Ambulatory Visit | Attending: Radiation Oncology | Admitting: Radiation Oncology

## 2021-10-10 DIAGNOSIS — C3492 Malignant neoplasm of unspecified part of left bronchus or lung: Secondary | ICD-10-CM | POA: Insufficient documentation

## 2021-10-10 LAB — RAD ONC ARIA SESSION SUMMARY
Course Elapsed Days: 0
Plan Fractions Treated to Date: 1
Plan Prescribed Dose Per Fraction: 18 Gy
Plan Total Fractions Prescribed: 3
Plan Total Prescribed Dose: 54 Gy
Reference Point Dosage Given to Date: 18 Gy
Reference Point Session Dosage Given: 18 Gy
Session Number: 1

## 2021-10-11 ENCOUNTER — Ambulatory Visit: Payer: Medicare Other

## 2021-10-12 ENCOUNTER — Other Ambulatory Visit: Payer: Self-pay

## 2021-10-12 ENCOUNTER — Ambulatory Visit
Admission: RE | Admit: 2021-10-12 | Discharge: 2021-10-12 | Disposition: A | Discharge status: Home / Self Care | Payer: Medicare Other | Source: Ambulatory Visit | Attending: Radiation Oncology | Admitting: Radiation Oncology

## 2021-10-12 DIAGNOSIS — C3492 Malignant neoplasm of unspecified part of left bronchus or lung: Secondary | ICD-10-CM

## 2021-10-12 LAB — RAD ONC ARIA SESSION SUMMARY
Course Elapsed Days: 2
Plan Fractions Treated to Date: 2
Plan Prescribed Dose Per Fraction: 18 Gy
Plan Total Fractions Prescribed: 3
Plan Total Prescribed Dose: 54 Gy
Reference Point Dosage Given to Date: 36 Gy
Reference Point Session Dosage Given: 18 Gy
Session Number: 2

## 2021-10-14 DIAGNOSIS — M47812 Spondylosis without myelopathy or radiculopathy, cervical region: Secondary | ICD-10-CM | POA: Diagnosis not present

## 2021-10-14 DIAGNOSIS — C9000 Multiple myeloma not having achieved remission: Secondary | ICD-10-CM | POA: Diagnosis not present

## 2021-10-14 DIAGNOSIS — Z9181 History of falling: Secondary | ICD-10-CM | POA: Diagnosis not present

## 2021-10-14 DIAGNOSIS — M4803 Spinal stenosis, cervicothoracic region: Secondary | ICD-10-CM | POA: Diagnosis not present

## 2021-10-14 DIAGNOSIS — D630 Anemia in neoplastic disease: Secondary | ICD-10-CM | POA: Diagnosis not present

## 2021-10-14 DIAGNOSIS — R918 Other nonspecific abnormal finding of lung field: Secondary | ICD-10-CM | POA: Diagnosis not present

## 2021-10-14 DIAGNOSIS — R1310 Dysphagia, unspecified: Secondary | ICD-10-CM | POA: Diagnosis not present

## 2021-10-14 DIAGNOSIS — I700 Atherosclerosis of aorta: Secondary | ICD-10-CM | POA: Diagnosis not present

## 2021-10-14 DIAGNOSIS — J449 Chronic obstructive pulmonary disease, unspecified: Secondary | ICD-10-CM | POA: Diagnosis not present

## 2021-10-14 DIAGNOSIS — M4802 Spinal stenosis, cervical region: Secondary | ICD-10-CM | POA: Diagnosis not present

## 2021-10-14 DIAGNOSIS — M4312 Spondylolisthesis, cervical region: Secondary | ICD-10-CM | POA: Diagnosis not present

## 2021-10-14 DIAGNOSIS — F17210 Nicotine dependence, cigarettes, uncomplicated: Secondary | ICD-10-CM | POA: Diagnosis not present

## 2021-10-14 DIAGNOSIS — N184 Chronic kidney disease, stage 4 (severe): Secondary | ICD-10-CM | POA: Diagnosis not present

## 2021-10-14 DIAGNOSIS — Z87440 Personal history of urinary (tract) infections: Secondary | ICD-10-CM | POA: Diagnosis not present

## 2021-10-14 DIAGNOSIS — Z7982 Long term (current) use of aspirin: Secondary | ICD-10-CM | POA: Diagnosis not present

## 2021-10-14 DIAGNOSIS — I1310 Hypertensive heart and chronic kidney disease without heart failure, with stage 1 through stage 4 chronic kidney disease, or unspecified chronic kidney disease: Secondary | ICD-10-CM | POA: Diagnosis not present

## 2021-10-14 DIAGNOSIS — I2510 Atherosclerotic heart disease of native coronary artery without angina pectoris: Secondary | ICD-10-CM | POA: Diagnosis not present

## 2021-10-14 DIAGNOSIS — I160 Hypertensive urgency: Secondary | ICD-10-CM | POA: Diagnosis not present

## 2021-10-14 DIAGNOSIS — D631 Anemia in chronic kidney disease: Secondary | ICD-10-CM | POA: Diagnosis not present

## 2021-10-17 ENCOUNTER — Ambulatory Visit
Admission: RE | Admit: 2021-10-17 | Discharge: 2021-10-17 | Disposition: A | Discharge status: Home / Self Care | Payer: Medicare Other | Source: Ambulatory Visit | Attending: Radiation Oncology | Admitting: Radiation Oncology

## 2021-10-17 ENCOUNTER — Encounter: Payer: Self-pay | Admitting: Radiation Oncology

## 2021-10-17 ENCOUNTER — Other Ambulatory Visit: Payer: Self-pay

## 2021-10-17 DIAGNOSIS — Z510 Encounter for antineoplastic radiation therapy: Secondary | ICD-10-CM | POA: Diagnosis not present

## 2021-10-17 DIAGNOSIS — C3492 Malignant neoplasm of unspecified part of left bronchus or lung: Secondary | ICD-10-CM

## 2021-10-17 DIAGNOSIS — F17210 Nicotine dependence, cigarettes, uncomplicated: Secondary | ICD-10-CM | POA: Diagnosis not present

## 2021-10-17 DIAGNOSIS — C3412 Malignant neoplasm of upper lobe, left bronchus or lung: Secondary | ICD-10-CM | POA: Diagnosis not present

## 2021-10-17 LAB — RAD ONC ARIA SESSION SUMMARY
Course Elapsed Days: 7
Plan Fractions Treated to Date: 3
Plan Prescribed Dose Per Fraction: 18 Gy
Plan Total Fractions Prescribed: 3
Plan Total Prescribed Dose: 54 Gy
Reference Point Dosage Given to Date: 54 Gy
Reference Point Session Dosage Given: 18 Gy
Session Number: 3

## 2021-10-19 DIAGNOSIS — I129 Hypertensive chronic kidney disease with stage 1 through stage 4 chronic kidney disease, or unspecified chronic kidney disease: Secondary | ICD-10-CM | POA: Diagnosis not present

## 2021-10-19 DIAGNOSIS — J449 Chronic obstructive pulmonary disease, unspecified: Secondary | ICD-10-CM | POA: Diagnosis not present

## 2021-10-28 DIAGNOSIS — I160 Hypertensive urgency: Secondary | ICD-10-CM | POA: Diagnosis not present

## 2021-10-28 DIAGNOSIS — D631 Anemia in chronic kidney disease: Secondary | ICD-10-CM | POA: Diagnosis not present

## 2021-10-28 DIAGNOSIS — J449 Chronic obstructive pulmonary disease, unspecified: Secondary | ICD-10-CM | POA: Diagnosis not present

## 2021-10-28 DIAGNOSIS — M47812 Spondylosis without myelopathy or radiculopathy, cervical region: Secondary | ICD-10-CM | POA: Diagnosis not present

## 2021-10-28 DIAGNOSIS — D630 Anemia in neoplastic disease: Secondary | ICD-10-CM | POA: Diagnosis not present

## 2021-10-28 DIAGNOSIS — C9000 Multiple myeloma not having achieved remission: Secondary | ICD-10-CM | POA: Diagnosis not present

## 2021-10-28 DIAGNOSIS — I1310 Hypertensive heart and chronic kidney disease without heart failure, with stage 1 through stage 4 chronic kidney disease, or unspecified chronic kidney disease: Secondary | ICD-10-CM | POA: Diagnosis not present

## 2021-10-28 DIAGNOSIS — Z9181 History of falling: Secondary | ICD-10-CM | POA: Diagnosis not present

## 2021-10-28 DIAGNOSIS — I700 Atherosclerosis of aorta: Secondary | ICD-10-CM | POA: Diagnosis not present

## 2021-10-28 DIAGNOSIS — Z7982 Long term (current) use of aspirin: Secondary | ICD-10-CM | POA: Diagnosis not present

## 2021-10-28 DIAGNOSIS — F17210 Nicotine dependence, cigarettes, uncomplicated: Secondary | ICD-10-CM | POA: Diagnosis not present

## 2021-10-28 DIAGNOSIS — N184 Chronic kidney disease, stage 4 (severe): Secondary | ICD-10-CM | POA: Diagnosis not present

## 2021-10-28 DIAGNOSIS — R1310 Dysphagia, unspecified: Secondary | ICD-10-CM | POA: Diagnosis not present

## 2021-10-28 DIAGNOSIS — Z87440 Personal history of urinary (tract) infections: Secondary | ICD-10-CM | POA: Diagnosis not present

## 2021-10-28 DIAGNOSIS — M4802 Spinal stenosis, cervical region: Secondary | ICD-10-CM | POA: Diagnosis not present

## 2021-10-28 DIAGNOSIS — M4803 Spinal stenosis, cervicothoracic region: Secondary | ICD-10-CM | POA: Diagnosis not present

## 2021-10-28 DIAGNOSIS — M4312 Spondylolisthesis, cervical region: Secondary | ICD-10-CM | POA: Diagnosis not present

## 2021-10-28 DIAGNOSIS — R918 Other nonspecific abnormal finding of lung field: Secondary | ICD-10-CM | POA: Diagnosis not present

## 2021-10-28 DIAGNOSIS — I2510 Atherosclerotic heart disease of native coronary artery without angina pectoris: Secondary | ICD-10-CM | POA: Diagnosis not present

## 2021-11-19 DIAGNOSIS — I129 Hypertensive chronic kidney disease with stage 1 through stage 4 chronic kidney disease, or unspecified chronic kidney disease: Secondary | ICD-10-CM | POA: Diagnosis not present

## 2021-11-19 DIAGNOSIS — I10 Essential (primary) hypertension: Secondary | ICD-10-CM | POA: Diagnosis not present

## 2021-11-23 DIAGNOSIS — I129 Hypertensive chronic kidney disease with stage 1 through stage 4 chronic kidney disease, or unspecified chronic kidney disease: Secondary | ICD-10-CM | POA: Diagnosis not present

## 2021-11-23 DIAGNOSIS — D638 Anemia in other chronic diseases classified elsewhere: Secondary | ICD-10-CM | POA: Diagnosis not present

## 2021-11-23 DIAGNOSIS — N184 Chronic kidney disease, stage 4 (severe): Secondary | ICD-10-CM | POA: Diagnosis not present

## 2021-11-23 DIAGNOSIS — D696 Thrombocytopenia, unspecified: Secondary | ICD-10-CM | POA: Diagnosis not present

## 2021-11-25 ENCOUNTER — Encounter: Payer: Self-pay | Admitting: Radiation Oncology

## 2021-11-26 NOTE — Progress Notes (Incomplete)
  Radiation Oncology         (336) [phone] ________________________________  Patient Name: Lisa [name] MRN: [id number] DOB: 20-Jan-1935 Referring Physician: Derek Jack Date of Service: 10/17/2021 [facility] [place]                                                        End Of Treatment Note  Diagnoses: C34.12-Malignant neoplasm of upper lobe, left bronchus or lung  Cancer Staging: The encounter diagnosis was Malignant neoplasm of upper lobe of left lung ([place]).   moderately differentiated adenocarcinoma of the LUL, cT1, cN0, cM0, stage I  Intent: Curative  Radiation Treatment Dates: 10/10/2021 through 10/17/2021 Site Technique Total Dose (Gy) Dose per Fx (Gy) Completed Fx Beam Energies  Lung, Left: Lung_L IMRT 54/54 18 3/3 [id number]   Narrative: The patient tolerated radiation therapy relatively well. On the date of her final treatment, the patient reported mild fatigue and mild shortness of breath. She denied any other symptoms.   Plan: The patient will follow-up with radiation oncology in one month .  ________________________________________________ -----------------------------------  Blair Promise, PhD, MD  This document serves as a record of services personally performed by Gery Pray, MD. It was created on his behalf by Roney Mans, a trained medical scribe. The creation of this record is based on the scribe's personal observations and the provider's statements to them. This document has been checked and approved by the attending provider.

## 2021-11-27 NOTE — Progress Notes (Signed)
Radiation Oncology         [phone]) [phone] ________________________________  Name: Lisa [name] MRN: [id number]  Date: 11/28/2021  DOB: 1934-07-02  Follow-Up Visit Note  CC: Carrolyn Meiers, MD  Derek Jack, MD  No diagnosis found.  Diagnosis: The encounter diagnosis was Malignant neoplasm of upper lobe of left lung ([place]).   moderately differentiated adenocarcinoma of the LUL, cT1, cN0, cM0, stage I  Interval Since Last Radiation: 1 month and 11 days   Intent: Curative  Radiation Treatment Dates: 10/10/2021 through 10/17/2021 Site Technique Total Dose (Gy) Dose per Fx (Gy) Completed Fx Beam Energies  Lung, Left: Lung_L IMRT 54/54 18 3/3 6XFFF    Narrative:  The patient returns today for routine follow-up.  The patient tolerated radiation therapy relatively well. On the date of her final treatment, the patient reported mild fatigue and mild shortness of breath. She denied any other symptoms.      No significant oncologic history since the patient was seen for consultation.                            Allergies:  has No Known Allergies.  Meds: Current Outpatient Medications  Medication Sig Dispense Refill   amLODipine (NORVASC) 10 MG tablet Take 1 tablet (10 mg total) by mouth daily. 30 tablet 2   aspirin 81 MG EC tablet Take 81 mg by mouth in the morning.     calcium carbonate (OS-CAL - DOSED IN MG OF ELEMENTAL CALCIUM) 1250 (500 Ca) MG tablet Take 1 tablet by mouth daily with breakfast.     carvedilol (COREG) 25 MG tablet Take 1 tablet (25 mg total) by mouth 2 (two) times daily with a meal. 60 tablet 2   Cholecalciferol (VITAMIN D3) 125 MCG (5000 UT) CAPS Take 5,000 Units by mouth daily.     feeding supplement, ENSURE COMPLETE, (ENSURE COMPLETE) LIQD Take 237 mLs by mouth 2 (two) times daily between meals.     furosemide (LASIX) 20 MG tablet Take 20 mg by mouth 2 (two) times daily. Morning & afternoon     Ipratropium-Albuterol (COMBIVENT) 20-100 MCG/ACT AERS  respimat Inhale 1 puff into the lungs every 6 (six) hours as needed for wheezing or shortness of breath.      ipratropium-albuterol (DUONEB) 0.5-2.5 (3) MG/3ML SOLN Take 3 mLs by nebulization every 4 (four) hours as needed (wheezing/shortness of breath).     isosorbide mononitrate (IMDUR) 30 MG 24 hr tablet Take 1 tablet (30 mg total) by mouth daily. 30 tablet 2   nitroGLYCERIN (NITROSTAT) 0.4 MG SL tablet Place 1 tablet (0.4 mg total) under the tongue every 5 (five) minutes as needed for chest pain. 100 tablet 1   ondansetron (ZOFRAN) 4 MG tablet Take 1 tablet (4 mg total) by mouth every 6 (six) hours as needed for nausea. 20 tablet 0   No current facility-administered medications for this encounter.    Physical Findings: The patient is in no acute distress. Patient is alert and oriented.  vitals were not taken for this visit. .  No significant changes. Lungs are clear to auscultation bilaterally. Heart has regular rate and rhythm. No palpable cervical, supraclavicular, or axillary adenopathy. Abdomen soft, non-tender, normal bowel sounds. ***   Lab Findings: Lab Results  Component Value Date   WBC 3.8 (L) 09/06/2021   HGB 8.6 (L) 09/06/2021   HCT 26.3 (L) 09/06/2021   MCV 93.3 09/06/2021   PLT 92 (L)  09/06/2021    Radiographic Findings: No results found.  Impression:  The encounter diagnosis was Malignant neoplasm of upper lobe of left lung ([place]).   moderately differentiated adenocarcinoma of the LUL, cT1, cN0, cM0, stage I  The patient is recovering from the effects of radiation.  ***  Plan:  ***   *** minutes of total time was spent for this patient encounter, including preparation, face-to-face counseling with the patient and coordination of care, physical exam, and documentation of the encounter. ____________________________________  Blair Promise, PhD, MD  This document serves as a record of services personally performed by Gery Pray, MD. It was created on his  behalf by Roney Mans, a trained medical scribe. The creation of this record is based on the scribe's personal observations and the provider's statements to them. This document has been checked and approved by the attending provider.

## 2021-11-28 ENCOUNTER — Ambulatory Visit
Admission: RE | Admit: 2021-11-28 | Discharge: 2021-11-28 | Disposition: A | Discharge status: Home / Self Care | Payer: Medicare Other | Source: Ambulatory Visit | Attending: Radiation Oncology | Admitting: Radiation Oncology

## 2021-11-28 ENCOUNTER — Encounter: Payer: Self-pay | Admitting: Radiation Oncology

## 2021-11-28 ENCOUNTER — Other Ambulatory Visit: Payer: Self-pay

## 2021-11-28 ENCOUNTER — Other Ambulatory Visit (HOSPITAL_COMMUNITY): Payer: Self-pay | Admitting: Radiation Oncology

## 2021-11-28 VITALS — BP 155/62 | HR 59 | Temp 98.1°F | Resp 17

## 2021-11-28 DIAGNOSIS — Z923 Personal history of irradiation: Secondary | ICD-10-CM | POA: Insufficient documentation

## 2021-11-28 DIAGNOSIS — C3492 Malignant neoplasm of unspecified part of left bronchus or lung: Secondary | ICD-10-CM

## 2021-11-28 DIAGNOSIS — M7989 Other specified soft tissue disorders: Secondary | ICD-10-CM

## 2021-11-28 DIAGNOSIS — C9000 Multiple myeloma not having achieved remission: Secondary | ICD-10-CM

## 2021-11-28 DIAGNOSIS — C3412 Malignant neoplasm of upper lobe, left bronchus or lung: Secondary | ICD-10-CM | POA: Insufficient documentation

## 2021-11-28 DIAGNOSIS — Z79899 Other long term (current) drug therapy: Secondary | ICD-10-CM | POA: Insufficient documentation

## 2021-11-28 HISTORY — DX: Personal history of irradiation: Z92.3

## 2021-11-28 NOTE — Progress Notes (Signed)
Fredda Hammed is here today for follow up post radiation to the lung.  Lung Side: Left,patient completed treatment on 10/17/21  Does the patient complain of any of the following: Pain:No Shortness of breath w/wo exertion: Yes on exertion.  Cough: Yes, dry cough Hemoptysis: No Pain with swallowing: No Swallowing/choking concerns: No Appetite: Fair, patient reports drinking 1-2 ensures daily.  Weight:  Wt Readings from Last 3 Encounters:  09/27/21 105 lb (47.6 kg)  09/19/21 104 lb (47.2 kg)  09/12/21 102 lb 1.2 oz (46.3 kg)    Energy Level: Continues to have low energy level.  Post radiation skin Changes: No     Additional comments if applicable: Patient reports swelling to left am. Denies any injury to left arm. Noted arm to be mildly warm.

## 2021-11-29 ENCOUNTER — Ambulatory Visit (HOSPITAL_COMMUNITY)
Admission: RE | Admit: 2021-11-29 | Discharge: 2021-11-29 | Disposition: A | Discharge status: Home / Self Care | Payer: Medicare Other | Source: Ambulatory Visit | Attending: Radiation Oncology | Admitting: Radiation Oncology

## 2021-11-29 ENCOUNTER — Telehealth: Payer: Self-pay

## 2021-11-29 DIAGNOSIS — M7989 Other specified soft tissue disorders: Secondary | ICD-10-CM | POA: Diagnosis not present

## 2021-11-29 NOTE — Telephone Encounter (Signed)
Called patient to make aware that no DVT was seen on recent doppler. Niece questioned where swelling could be coming from, encouraged niece to follow up with PCP due to patient being on several blood pressure medications. Niece voiced understanding.

## 2021-12-01 DIAGNOSIS — N1832 Chronic kidney disease, stage 3b: Secondary | ICD-10-CM | POA: Diagnosis not present

## 2021-12-01 DIAGNOSIS — I129 Hypertensive chronic kidney disease with stage 1 through stage 4 chronic kidney disease, or unspecified chronic kidney disease: Secondary | ICD-10-CM | POA: Diagnosis not present

## 2021-12-01 DIAGNOSIS — D696 Thrombocytopenia, unspecified: Secondary | ICD-10-CM | POA: Diagnosis not present

## 2021-12-01 DIAGNOSIS — N170 Acute kidney failure with tubular necrosis: Secondary | ICD-10-CM | POA: Diagnosis not present

## 2021-12-01 DIAGNOSIS — R809 Proteinuria, unspecified: Secondary | ICD-10-CM | POA: Diagnosis not present

## 2021-12-05 ENCOUNTER — Inpatient Hospital Stay (HOSPITAL_COMMUNITY): Payer: Medicare Other

## 2021-12-05 ENCOUNTER — Inpatient Hospital Stay (HOSPITAL_COMMUNITY): Payer: Medicare Other | Attending: Hematology | Admitting: Hematology

## 2021-12-05 ENCOUNTER — Ambulatory Visit (HOSPITAL_COMMUNITY)
Admission: RE | Admit: 2021-12-05 | Discharge: 2021-12-05 | Disposition: A | Discharge status: Home / Self Care | Payer: Medicare Other | Source: Ambulatory Visit | Attending: Hematology | Admitting: Hematology

## 2021-12-05 VITALS — BP 150/78 | HR 50 | Temp 98.1°F | Resp 18 | Wt 110.4 lb

## 2021-12-05 DIAGNOSIS — C3412 Malignant neoplasm of upper lobe, left bronchus or lung: Secondary | ICD-10-CM | POA: Diagnosis not present

## 2021-12-05 DIAGNOSIS — R918 Other nonspecific abnormal finding of lung field: Secondary | ICD-10-CM | POA: Insufficient documentation

## 2021-12-05 DIAGNOSIS — M7989 Other specified soft tissue disorders: Secondary | ICD-10-CM | POA: Diagnosis not present

## 2021-12-05 DIAGNOSIS — R600 Localized edema: Secondary | ICD-10-CM | POA: Diagnosis not present

## 2021-12-05 DIAGNOSIS — F17210 Nicotine dependence, cigarettes, uncomplicated: Secondary | ICD-10-CM | POA: Diagnosis not present

## 2021-12-05 DIAGNOSIS — R0609 Other forms of dyspnea: Secondary | ICD-10-CM | POA: Insufficient documentation

## 2021-12-05 DIAGNOSIS — D472 Monoclonal gammopathy: Secondary | ICD-10-CM | POA: Insufficient documentation

## 2021-12-05 DIAGNOSIS — Z79899 Other long term (current) drug therapy: Secondary | ICD-10-CM | POA: Insufficient documentation

## 2021-12-05 DIAGNOSIS — C3490 Malignant neoplasm of unspecified part of unspecified bronchus or lung: Secondary | ICD-10-CM | POA: Diagnosis not present

## 2021-12-05 DIAGNOSIS — D649 Anemia, unspecified: Secondary | ICD-10-CM | POA: Insufficient documentation

## 2021-12-05 DIAGNOSIS — R911 Solitary pulmonary nodule: Secondary | ICD-10-CM | POA: Diagnosis not present

## 2021-12-05 LAB — IRON AND TIBC
Iron: 85 ug/dL (ref 28–170)
Saturation Ratios: 30 % (ref 10.4–31.8)
TIBC: 287 ug/dL (ref 250–450)
UIBC: 202 ug/dL

## 2021-12-05 LAB — COMPREHENSIVE METABOLIC PANEL
ALT: 17 U/L (ref 0–44)
AST: 25 U/L (ref 15–41)
Albumin: 3.1 g/dL — ABNORMAL LOW (ref 3.5–5.0)
Alkaline Phosphatase: 41 U/L (ref 38–126)
Anion gap: 5 (ref 5–15)
BUN: 27 mg/dL — ABNORMAL HIGH (ref 8–23)
CO2: 26 mmol/L (ref 22–32)
Calcium: 8.8 mg/dL — ABNORMAL LOW (ref 8.9–10.3)
Chloride: 109 mmol/L (ref 98–111)
Creatinine, Ser: 1.33 mg/dL — ABNORMAL HIGH (ref 0.44–1.00)
GFR, Estimated: 39 mL/min — ABNORMAL LOW (ref >60)
Glucose, Bld: 94 mg/dL (ref 70–99)
Potassium: 4 mmol/L (ref 3.5–5.1)
Sodium: 140 mmol/L (ref 135–145)
Total Bilirubin: 0.6 mg/dL (ref 0.3–1.2)
Total Protein: 8.8 g/dL — ABNORMAL HIGH (ref 6.5–8.1)

## 2021-12-05 LAB — CBC WITH DIFFERENTIAL/PLATELET
Abs Immature Granulocytes: 0.01 10*3/uL (ref 0.00–0.07)
Basophils Absolute: 0 10*3/uL (ref 0.0–0.1)
Basophils Relative: 0 %
Eosinophils Absolute: 0 10*3/uL (ref 0.0–0.5)
Eosinophils Relative: 1 %
HCT: 29 % — ABNORMAL LOW (ref 36.0–46.0)
Hemoglobin: 9.2 g/dL — ABNORMAL LOW (ref 12.0–15.0)
Immature Granulocytes: 0 %
Lymphocytes Relative: 22 %
Lymphs Abs: 0.6 10*3/uL — ABNORMAL LOW (ref 0.7–4.0)
MCH: 29.4 pg (ref 26.0–34.0)
MCHC: 31.7 g/dL (ref 30.0–36.0)
MCV: 92.7 fL (ref 80.0–100.0)
Monocytes Absolute: 0.2 10*3/uL (ref 0.1–1.0)
Monocytes Relative: 8 %
Neutro Abs: 1.9 10*3/uL (ref 1.7–7.7)
Neutrophils Relative %: 69 %
Platelets: 106 10*3/uL — ABNORMAL LOW (ref 150–400)
RBC: 3.13 MIL/uL — ABNORMAL LOW (ref 3.87–5.11)
RDW: 14.6 % (ref 11.5–15.5)
WBC: 2.8 10*3/uL — ABNORMAL LOW (ref 4.0–10.5)
nRBC: 0 % (ref 0.0–0.2)

## 2021-12-05 LAB — FERRITIN: Ferritin: 536 ng/mL — ABNORMAL HIGH (ref 11–307)

## 2021-12-05 LAB — D-DIMER, QUANTITATIVE: D-Dimer, Quant: 1.39 ug/mL-FEU — ABNORMAL HIGH (ref 0.00–0.50)

## 2021-12-05 MED ORDER — IOHEXOL 300 MG/ML  SOLN
60.0000 mL | Freq: Once | INTRAMUSCULAR | Status: AC | PRN
  Administered 2021-12-05: 60 mL via INTRAVENOUS

## 2021-12-14 DIAGNOSIS — J449 Chronic obstructive pulmonary disease, unspecified: Secondary | ICD-10-CM | POA: Diagnosis not present

## 2021-12-14 DIAGNOSIS — N184 Chronic kidney disease, stage 4 (severe): Secondary | ICD-10-CM | POA: Diagnosis not present

## 2021-12-14 DIAGNOSIS — D472 Monoclonal gammopathy: Secondary | ICD-10-CM | POA: Diagnosis not present

## 2021-12-14 DIAGNOSIS — I10 Essential (primary) hypertension: Secondary | ICD-10-CM | POA: Diagnosis not present

## 2021-12-15 ENCOUNTER — Inpatient Hospital Stay (HOSPITAL_COMMUNITY): Payer: Medicare Other | Attending: Hematology | Admitting: Hematology

## 2021-12-15 ENCOUNTER — Inpatient Hospital Stay (HOSPITAL_COMMUNITY): Payer: Medicare Other

## 2021-12-15 ENCOUNTER — Other Ambulatory Visit (HOSPITAL_COMMUNITY): Payer: Self-pay

## 2021-12-15 VITALS — BP 114/60 | HR 51 | Temp 97.5°F | Resp 18 | Wt 115.5 lb

## 2021-12-15 DIAGNOSIS — F17210 Nicotine dependence, cigarettes, uncomplicated: Secondary | ICD-10-CM | POA: Diagnosis not present

## 2021-12-15 DIAGNOSIS — C3492 Malignant neoplasm of unspecified part of left bronchus or lung: Secondary | ICD-10-CM

## 2021-12-15 DIAGNOSIS — D472 Monoclonal gammopathy: Secondary | ICD-10-CM

## 2021-12-15 DIAGNOSIS — E049 Nontoxic goiter, unspecified: Secondary | ICD-10-CM | POA: Diagnosis not present

## 2021-12-15 DIAGNOSIS — D649 Anemia, unspecified: Secondary | ICD-10-CM | POA: Insufficient documentation

## 2021-12-15 DIAGNOSIS — M7989 Other specified soft tissue disorders: Secondary | ICD-10-CM | POA: Insufficient documentation

## 2021-12-15 DIAGNOSIS — Z79899 Other long term (current) drug therapy: Secondary | ICD-10-CM | POA: Diagnosis not present

## 2021-12-15 DIAGNOSIS — C3412 Malignant neoplasm of upper lobe, left bronchus or lung: Secondary | ICD-10-CM | POA: Insufficient documentation

## 2021-12-15 LAB — COMPREHENSIVE METABOLIC PANEL
ALT: 17 U/L (ref 0–44)
AST: 26 U/L (ref 15–41)
Albumin: 3 g/dL — ABNORMAL LOW (ref 3.5–5.0)
Alkaline Phosphatase: 41 U/L (ref 38–126)
Anion gap: 4 — ABNORMAL LOW (ref 5–15)
BUN: 31 mg/dL — ABNORMAL HIGH (ref 8–23)
CO2: 27 mmol/L (ref 22–32)
Calcium: 8.7 mg/dL — ABNORMAL LOW (ref 8.9–10.3)
Chloride: 107 mmol/L (ref 98–111)
Creatinine, Ser: 1.3 mg/dL — ABNORMAL HIGH (ref 0.44–1.00)
GFR, Estimated: 41 mL/min — ABNORMAL LOW (ref >60)
Glucose, Bld: 100 mg/dL — ABNORMAL HIGH (ref 70–99)
Potassium: 4.3 mmol/L (ref 3.5–5.1)
Sodium: 138 mmol/L (ref 135–145)
Total Bilirubin: 0.5 mg/dL (ref 0.3–1.2)
Total Protein: 8.5 g/dL — ABNORMAL HIGH (ref 6.5–8.1)

## 2021-12-15 LAB — CBC WITH DIFFERENTIAL/PLATELET
Abs Immature Granulocytes: 0 10*3/uL (ref 0.00–0.07)
Basophils Absolute: 0 10*3/uL (ref 0.0–0.1)
Basophils Relative: 0 %
Eosinophils Absolute: 0 10*3/uL (ref 0.0–0.5)
Eosinophils Relative: 1 %
HCT: 28.3 % — ABNORMAL LOW (ref 36.0–46.0)
Hemoglobin: 8.9 g/dL — ABNORMAL LOW (ref 12.0–15.0)
Immature Granulocytes: 0 %
Lymphocytes Relative: 29 %
Lymphs Abs: 0.7 10*3/uL (ref 0.7–4.0)
MCH: 29.3 pg (ref 26.0–34.0)
MCHC: 31.4 g/dL (ref 30.0–36.0)
MCV: 93.1 fL (ref 80.0–100.0)
Monocytes Absolute: 0.3 10*3/uL (ref 0.1–1.0)
Monocytes Relative: 10 %
Neutro Abs: 1.5 10*3/uL — ABNORMAL LOW (ref 1.7–7.7)
Neutrophils Relative %: 60 %
Platelets: 105 10*3/uL — ABNORMAL LOW (ref 150–400)
RBC: 3.04 MIL/uL — ABNORMAL LOW (ref 3.87–5.11)
RDW: 14.7 % (ref 11.5–15.5)
WBC: 2.4 10*3/uL — ABNORMAL LOW (ref 4.0–10.5)
nRBC: 0 % (ref 0.0–0.2)

## 2021-12-15 LAB — LACTATE DEHYDROGENASE: LDH: 172 U/L (ref 98–192)

## 2021-12-15 NOTE — Progress Notes (Signed)
[place], [place] [address]   CLINIC:  Medical Oncology/Hematology  PCP:  Carrolyn Meiers, MD [place] [address] [place] [id number]  [phone]  REASON FOR VISIT:  Follow-up for MGUS and left lung cancer  PRIOR THERAPY: none  CURRENT THERAPY: surveillance  INTERVAL HISTORY:  Lisa [name], a 87 y.o. female, returns for routine follow-up for her MGUS and left lung cancer. Lisa [name] was last seen on 12/05/2021.  Today she reports feeling good.   REVIEW OF SYSTEMS:  Review of Systems  Constitutional:  Negative for appetite change and fatigue.  Respiratory:  Positive for shortness of breath.   All other systems reviewed and are negative.   PAST MEDICAL/SURGICAL HISTORY:  Past Medical History:  Diagnosis Date   Anemia    Carotid artery stenosis    CKD (chronic kidney disease)    COPD (chronic obstructive pulmonary disease) (H[facility])    Dyspnea    Due to COPD per patient   Goiter diffuse, nontoxic    Per patient   Hepatitis C    History of radiation therapy    Left lung-10/10/21-10/17/21- Dr. Gery Pray   Hypertension    Monoclonal gammopathy of unknown significance ([facility])    Pyelonephritis 04/06/2014   UTI   Past Surgical History:  Procedure Laterality Date   ABDOMINAL HYSTERECTOMY     CHOLECYSTECTOMY N/A 09/17/2018   Procedure: LAPAROSCOPIC CHOLECYSTECTOMY;  Surgeon: Virl Cagey, MD;  Location: [facility];  Service: General;  Laterality: N/A;   FUDUCIAL PLACEMENT Left 09/01/2021   Procedure: PLACEMENT OF FIDUCIAL MARKERS TIMES FOUR;  Surgeon: Melrose Nakayama, MD;  Location: [facility];  Service: Thoracic;  Laterality: Left;   TEE WITHOUT CARDIOVERSION N/A 04/10/2014   Procedure: TRANSESOPHAGEAL ECHOCARDIOGRAM (TEE);  Surgeon: [name], MD;  Location: [facility];  Service: Cardiovascular;  Laterality: N/A;   VIDEO BRONCHOSCOPY WITH ENDOBRONCHIAL NAVIGATION N/A 09/01/2021   Procedure: VIDEO  BRONCHOSCOPY WITH ENDOBRONCHIAL NAVIGATION;  Surgeon: Melrose Nakayama, MD;  Location: [facility];  Service: Thoracic;  Laterality: N/A;    SOCIAL HISTORY:  Social History   Socioeconomic History   Marital status: Widowed    Spouse name: Not on file   Number of children: Not on file   Years of education: Not on file   Highest education level: Not on file  Occupational History   Not on file  Tobacco Use   Smoking status: Every Day    Packs/day: 0.25    Types: Cigarettes   Smokeless tobacco: Never  Vaping Use   Vaping Use: Never used  Substance and Sexual Activity   Alcohol use: No   Drug use: No   Sexual activity: Not on file  Other Topics Concern   Not on file  Social History Narrative   Not on file   Social Determinants of Health   Financial Resource Strain: Medium Risk (09/21/2021)   Overall Financial Resource Strain (CARDIA)    Difficulty of Paying Living Expenses: Somewhat hard  Food Insecurity: Not on file  Transportation Needs: Not on file  Physical Activity: Inactive (09/21/2021)   Exercise Vital Sign    Days of Exercise per Week: 0 days    Minutes of Exercise per Session: 0 min  Stress: Not on file  Social Connections: Not on file  Intimate Partner Violence: Not on file    FAMILY HISTORY:  Family History  Problem Relation Age of Onset   Hypertension Mother  Thyroid disease Mother    Hypertension Father    Thyroid disease Sister    Thyroid disease Brother    Thyroid disease Sister     CURRENT MEDICATIONS:  Current Outpatient Medications  Medication Sig Dispense Refill   amLODipine (NORVASC) 10 MG tablet Take 1 tablet (10 mg total) by mouth daily. 30 tablet 2   aspirin 81 MG EC tablet Take 81 mg by mouth in the morning.     calcium carbonate (OS-CAL - DOSED IN MG OF ELEMENTAL CALCIUM) 1250 (500 Ca) MG tablet Take 1 tablet by mouth daily with breakfast.     carvedilol (COREG) 25 MG tablet Take 1 tablet (25 mg total) by mouth 2 (two) times daily with  a meal. 60 tablet 2   Cholecalciferol (VITAMIN D3) 125 MCG (5000 UT) CAPS Take 5,000 Units by mouth daily.     feeding supplement, ENSURE COMPLETE, (ENSURE COMPLETE) LIQD Take 237 mLs by mouth 2 (two) times daily between meals.     furosemide (LASIX) 20 MG tablet Take 20 mg by mouth 2 (two) times daily. Morning & afternoon     Ipratropium-Albuterol (COMBIVENT) 20-100 MCG/ACT AERS respimat Inhale 1 puff into the lungs every 6 (six) hours as needed for wheezing or shortness of breath.      ipratropium-albuterol (DUONEB) 0.5-2.5 (3) MG/3ML SOLN Take 3 mLs by nebulization every 4 (four) hours as needed (wheezing/shortness of breath).     isosorbide mononitrate (IMDUR) 30 MG 24 hr tablet Take 1 tablet (30 mg total) by mouth daily. 30 tablet 2   megestrol (MEGACE) 20 MG tablet Take 20 mg by mouth daily.     nitroGLYCERIN (NITROSTAT) 0.4 MG SL tablet Place 1 tablet (0.4 mg total) under the tongue every 5 (five) minutes as needed for chest pain. (Patient not taking: Reported on 12/05/2021) 100 tablet 1   ondansetron (ZOFRAN) 4 MG tablet Take 1 tablet (4 mg total) by mouth every 6 (six) hours as needed for nausea. (Patient not taking: Reported on 12/05/2021) 20 tablet 0   No current facility-administered medications for this visit.    ALLERGIES:  No Known Allergies  PHYSICAL EXAM:  Performance status (ECOG): 2 - Symptomatic, <50% confined to bed  There were no vitals filed for this visit. Wt Readings from Last 3 Encounters:  12/05/21 110 lb 6.4 oz (50.1 kg)  09/27/21 105 lb (47.6 kg)  09/19/21 104 lb (47.2 kg)   Physical Exam Vitals reviewed.  Constitutional:      Appearance: Normal appearance.     Comments: In wheelchair  Cardiovascular:     Rate and Rhythm: Normal rate and regular rhythm.     Pulses: Normal pulses.     Heart sounds: Normal heart sounds.  Pulmonary:     Effort: Pulmonary effort is normal.     Breath sounds: Normal breath sounds.  Neurological:     General: No focal  deficit present.     Mental Status: She is alert and oriented to person, place, and time.  Psychiatric:        Mood and Affect: Mood normal.        Behavior: Behavior normal.     LABORATORY DATA:  I have reviewed the labs as listed.     Latest Ref Rng & Units 12/05/2021    2:08 PM 09/06/2021    3:05 AM 09/05/2021    2:33 AM  CBC  WBC 4.0 - 10.5 K/uL 2.8  3.8  4.9   Hemoglobin 12.0 - 15.0 g/dL  9.2  8.6  10.1   Hematocrit 36.0 - 46.0 % 29.0  26.3  30.6   Platelets 150 - 400 K/uL 106  92  103       Latest Ref Rng & Units 12/05/2021    2:08 PM 09/06/2021    3:05 AM 09/04/2021    3:32 AM  CMP  Glucose 70 - 99 mg/dL 94  101  129   BUN 8 - 23 mg/dL 27  86  61   Creatinine 0.44 - 1.00 mg/dL 1.33  1.89  1.71   Sodium 135 - 145 mmol/L 140  137  136   Potassium 3.5 - 5.1 mmol/L 4.0  4.2  4.4   Chloride 98 - 111 mmol/L 109  102  104   CO2 22 - 32 mmol/L 26  29  26    Calcium 8.9 - 10.3 mg/dL 8.8  8.8  8.9   Total Protein 6.5 - 8.1 g/dL 8.8     Total Bilirubin 0.3 - 1.2 mg/dL 0.6     Alkaline Phos 38 - 126 U/L 41     AST 15 - 41 U/L 25     ALT 0 - 44 U/L 17         Component Value Date/Time   RBC 3.13 (L) 12/05/2021 1408   MCV 92.7 12/05/2021 1408   MCH 29.4 12/05/2021 1408   MCHC 31.7 12/05/2021 1408   RDW 14.6 12/05/2021 1408   LYMPHSABS 0.6 (L) 12/05/2021 1408   MONOABS 0.2 12/05/2021 1408   EOSABS 0.0 12/05/2021 1408   BASOSABS 0.0 12/05/2021 1408    DIAGNOSTIC IMAGING:  I have independently reviewed the scans and discussed with the patient. CT Chest W Contrast  Result Date: 12/06/2021 CLINICAL DATA:  Non-small cell lung cancer, monitor. Left arm swelling for 2.5 weeks. No known injury. History of multinodular goiter. * Tracking Code: BO * EXAM: CT CHEST WITH CONTRAST TECHNIQUE: Multidetector CT imaging of the chest was performed during intravenous contrast administration. RADIATION DOSE REDUCTION: This exam was performed according to the departmental dose-optimization  program which includes automated exposure control, adjustment of the mA and/or kV according to patient size and/or use of iterative reconstruction technique. CONTRAST:  1mL OMNIPAQUE IOHEXOL 300 MG/ML  SOLN COMPARISON:  Chest CT without contrast 08/26/2021. CT neck 09/04/2021. Upper extremity venous ultrasound 11/29/2021 and 12/05/2021. FINDINGS: Cardiovascular: Extensive atherosclerosis of the aorta, great vessels and coronary arteries again noted. No acute vascular findings are identified, although there is possible ostial stenosis of the left common carotid and subclavian arteries. Aortic valvular and mitral annular calcifications are present. The heart size is normal. There is no pericardial effusion. Mediastinum/Nodes: There are no enlarged mediastinal, hilar or axillary lymph nodes.Marked heterogeneous enlargement of both thyroid lobes again noted with associated parenchymal calcifications and constriction of the trachea, similar to previous study. The esophagus appears unremarkable. Lungs/Pleura: No pleural effusion or pneumothorax. Mild centrilobular emphysema and central airway thickening. The spiculated nodule at the left lung apex has decreased in size, now measuring 1.7 x 1.3 cm on image 34/4 (previously 2.1 x 1.6 cm). Adjacent fiduciary markers. No new or enlarging nodules. Upper abdomen: The visualized upper abdomen appears stable without significant findings. Diffuse aortic and branch vessel atherosclerosis. Musculoskeletal/Chest wall: There is no chest wall mass or suspicious osseous finding. Mild spondylosis. IMPRESSION: 1. Interval response to therapy. The dominant spiculated nodule at the left lung apex has decreased in size. 2. No evidence of metastatic disease. 3. No acute vascular findings identified.  Possible ostial stenosis of the left common carotid and subclavian arteries. 4. Severe multinodular goiter with resulting mass effect on the trachea, similar to previous imaging. 5. Coronary and  aortic atherosclerosis (ICD10-I70.0). Emphysema (ICD10-J43.9). Electronically Signed   By: Richardean Sale M.D.   On: 12/06/2021 13:41   US Venous Img Upper Uni Left  Result Date: 12/05/2021 CLINICAL DATA:  Swelling x2 0.5 weeks, MGUS EXAM: LEFT UPPER EXTREMITY VENOUS DOPPLER ULTRASOUND TECHNIQUE: Gray-scale sonography with graded compression, as well as color Doppler and duplex ultrasound were performed to evaluate the upper extremity deep venous system from the level of the subclavian vein and including the jugular, axillary, basilic, radial, ulnar and upper cephalic vein. Spectral Doppler was utilized to evaluate flow at rest and with distal augmentation maneuvers. COMPARISON:  11/29/2021 FINDINGS: Contralateral IJ vein: Respiratory phasicity is normal and symmetric with the symptomatic side. No evidence of thrombus. Normal compressibility. Internal Jugular Vein: No evidence of thrombus. Normal compressibility, respiratory phasicity and response to augmentation. Subclavian Vein: No evidence of thrombus. Normal compressibility, respiratory phasicity and response to augmentation. Axillary Vein: No evidence of thrombus. Normal compressibility, respiratory phasicity and response to augmentation. Cephalic Vein: No evidence of thrombus. Normal compressibility, respiratory phasicity and response to augmentation. Basilic Vein: No evidence of thrombus. Normal compressibility, respiratory phasicity and response to augmentation. Brachial Veins: No evidence of thrombus. Normal compressibility, respiratory phasicity and response to augmentation. Radial Veins: No evidence of thrombus. Normal compressibility, respiratory phasicity and response to augmentation. Ulnar Veins: No evidence of thrombus. Normal compressibility, respiratory phasicity and response to augmentation. Venous Reflux:  None visualized. Other Findings:  Subcutaneous edema in the forearm. IMPRESSION: No evidence of DVT within the left upper extremity.  Electronically Signed   By: Lucrezia Europe M.D.   On: 12/05/2021 15:36   US Venous Img Upper Uni Left (DVT)  Result Date: 11/29/2021 CLINICAL DATA:  87 year old female with swelling EXAM: LEFT UPPER EXTREMITY VENOUS DOPPLER ULTRASOUND TECHNIQUE: Gray-scale sonography with graded compression, as well as color Doppler and duplex ultrasound were performed to evaluate the upper extremity deep venous system from the level of the subclavian vein and including the jugular, axillary, basilic, radial, ulnar and upper cephalic vein. Spectral Doppler was utilized to evaluate flow at rest and with distal augmentation maneuvers. COMPARISON:  None Available. FINDINGS: Contralateral Subclavian Vein: Respiratory phasicity is normal and symmetric with the symptomatic side. No evidence of thrombus. Normal compressibility. Internal Jugular Vein: No evidence of thrombus. Normal compressibility, respiratory phasicity and response to augmentation. Subclavian Vein: No evidence of thrombus. Normal compressibility, respiratory phasicity and response to augmentation. Axillary Vein: No evidence of thrombus. Normal compressibility, respiratory phasicity and response to augmentation. Cephalic Vein: No evidence of thrombus. Normal compressibility, respiratory phasicity and response to augmentation. Basilic Vein: No evidence of thrombus. Normal compressibility, respiratory phasicity and response to augmentation. Brachial Veins: No evidence of thrombus. Normal compressibility, respiratory phasicity and response to augmentation. Radial Veins: No evidence of thrombus. Normal compressibility, respiratory phasicity and response to augmentation. Ulnar Veins: No evidence of thrombus. Normal compressibility, respiratory phasicity and response to augmentation. Other Findings:  Redemonstration of thyroid goiter IMPRESSION: Directed duplex of the left upper extremity negative for DVT. Redemonstration of thyroid goiter Signed, Dulcy Fanny. Nadene Rubins, RPVI  [facility] Electronically Signed   By: Corrie Mckusick D.O.   On: 11/29/2021 08:15     ASSESSMENT:  IgG kappa smoldering myeloma: - Patient seen at the request of Dr. Theador Hawthorne for monoclonal  gammopathy. - Labs on 05/12/2021 showed M spike 2.4 g.  Immunofixation shows IgG kappa monoclonal protein with a faint IgA lambda monoclonal protein. - Kappa light chains 143, lambda light chains 38, ratio of 3.72. - 24-hour urine total protein 84 mg.  Urine immunofixation shows faint IgG kappa. - No B symptoms or neuropathy. - BMBX on 07/14/2021: Pathology shows hypocellular marrow with clonal plasma cell population comprising 15-20% of the total marrow cellularity.  No overt dysplasia. - Cytogenetics: 45,X-X[9]/46,XX[11] - Myeloma FISH panel: Monosomy 13, duplication 1 q. - Skeletal survey on 06/21/2021: 1 cm circumscribed sclerotic lesion in the distal tibial shaft on the right, nonspecific.  No lytic bone lesion.   Social/family history: - She lives with her nephew.  She is seen with her brother today.  She uses walker to ambulate. 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 - Current active smoker, half pack per day for 62 years.  3.  Stage I (T1 N0 M0) adenocarcinoma of the left upper lobe of the lung:       - PET scan on 08/04/2021: 1.7 x 1.4 cm left upper lobe nodule SUV 18.  No hypermetabolic mediastinal or hilar adenopathy.  No evidence of metastatic disease. - Navigational bronchoscopy and biopsy by Dr. Roxan Hockey on 09/01/2021.  Not a surgical candidate per Dr. Roxan Hockey. - Pathology consistent with moderately differentiated adenocarcinoma. - SBRT to the left upper lobe lung lesion, 56 Gray in 3 fractions completed in May 2023   PLAN:  IgG kappa smoldering myeloma: - PET scan on 08/04/2021 did not show any evidence of active myeloma.  No lytic lesions on the CT portion of the scan.  No soft tissue plasmacytoma. - I  will repeat myeloma panel and see her back in 1 month.  2.  Stage I (T1 N0 M0) adenocarcinoma of the left upper lobe of the lung: - CT chest with contrast (12/05/2021): Left lung apex spiculated nodule decreased in size, measuring 1.7 x 1.3 cm, previously 2.1 x 1.6 cm.  No evidence of metastatic disease.  3.  Normocytic anemia: - Last ferritin was 536 with percent saturation 30.  Hemoglobin is 8.9.  Creatinine is 1.3. - If the hemoglobin continues to downtrend, consider erythropoiesis stimulating agents.  4.  Left upper extremity swelling: - Reviewed Doppler from 12/05/2021 which was negative for DVT.  Previous Doppler on 11/29/2021 was also negative. - CT chest did not show any impingement.  However she has markedly enlarged both thyroid lobes, left more than right with constriction of the trachea. - It is possible that thyroid mass is causing compression of the vascular structures resulting in swelling of the left upper extremity from decreased venous return. - Recommend ultrasound of the thyroid.  Recommend consultation with Dr. Harlow Asa. - Left upper extremity swelling today stable predominantly in the lower part of the left humerus, left forearm and dorsum of the hand. - RTC 4 weeks for follow-up.  Orders placed this encounter:  No orders of the defined types were placed in this encounter.    Derek Jack, MD [place] [phone]   I, Thana Ates, am acting as a scribe for Dr. Derek Jack.  I, Derek Jack MD, have reviewed the above documentation for accuracy and completeness, and I agree with the above.

## 2021-12-15 NOTE — Patient Instructions (Addendum)
[place] at [facility] Discharge Instructions  You were seen and examined today by Dr. Delton Coombes.  Dr. Delton Coombes discussed your most recent lab work and CT scan which revealed that the cancer has shrunk in size. This is great news!  There is no identifiable reason for the swelling of your left arm. Dr. Delton Coombes has recommended an ultrasound of the thyroid as well as a surgical evaluation.  Follow-up as scheduled.  Thank you for choosing [place] at [facility] to provide your oncology and hematology care.  To afford each patient quality time with our provider, please arrive at least 15 minutes before your scheduled appointment time.   If you have a lab appointment with the [facility] please come in thru the Main Entrance and check in at the main information desk.  You need to re-schedule your appointment should you arrive 10 or more minutes late.  We strive to give you quality time with our providers, and arriving late affects you and other patients whose appointments are after yours.  Also, if you no show three or more times for appointments you may be dismissed from the clinic at the providers discretion.     Again, thank you for choosing [facility].  Our hope is that these requests will decrease the amount of time that you wait before being seen by our physicians.       _____________________________________________________________  Should you have questions after your visit to [facility], please contact our office at [phone] and follow the prompts.  Our office hours are 8:00 a.m. and 4:30 p.m. Monday - Friday.  Please note that voicemails left after 4:00 p.m. may not be returned until the following business day.  We are closed weekends and major holidays.  You do have access to a nurse 24-7, just call the main number to the clinic [phone] and do not press any options, hold on the line and a nurse  will answer the phone.    For prescription refill requests, have your pharmacy contact our office and allow 72 hours.

## 2021-12-16 LAB — KAPPA/LAMBDA LIGHT CHAINS
Kappa free light chain: 96.8 mg/L — ABNORMAL HIGH (ref 3.3–19.4)
Kappa, lambda light chain ratio: 3.56 — ABNORMAL HIGH (ref 0.26–1.65)
Lambda free light chains: 27.2 mg/L — ABNORMAL HIGH (ref 5.7–26.3)

## 2021-12-19 LAB — PROTEIN ELECTROPHORESIS, SERUM
A/G Ratio: 0.8 (ref 0.7–1.7)
Albumin ELP: 3.4 g/dL (ref 2.9–4.4)
Alpha-1-Globulin: 0.3 g/dL (ref 0.0–0.4)
Alpha-2-Globulin: 0.6 g/dL (ref 0.4–1.0)
Beta Globulin: 0.7 g/dL (ref 0.7–1.3)
Gamma Globulin: 2.8 g/dL — ABNORMAL HIGH (ref 0.4–1.8)
Globulin, Total: 4.5 g/dL — ABNORMAL HIGH (ref 2.2–3.9)
M-Spike, %: 2.6 g/dL — ABNORMAL HIGH
Total Protein ELP: 7.9 g/dL (ref 6.0–8.5)

## 2021-12-20 ENCOUNTER — Ambulatory Visit (HOSPITAL_COMMUNITY)
Admission: RE | Admit: 2021-12-20 | Discharge: 2021-12-20 | Disposition: A | Discharge status: Home / Self Care | Payer: Medicare Other | Source: Ambulatory Visit | Attending: Hematology | Admitting: Hematology

## 2021-12-20 DIAGNOSIS — C3492 Malignant neoplasm of unspecified part of left bronchus or lung: Secondary | ICD-10-CM | POA: Insufficient documentation

## 2021-12-20 DIAGNOSIS — D472 Monoclonal gammopathy: Secondary | ICD-10-CM | POA: Diagnosis not present

## 2021-12-20 DIAGNOSIS — E042 Nontoxic multinodular goiter: Secondary | ICD-10-CM | POA: Diagnosis not present

## 2021-12-20 DIAGNOSIS — E049 Nontoxic goiter, unspecified: Secondary | ICD-10-CM | POA: Insufficient documentation

## 2021-12-21 LAB — IMMUNOFIXATION ELECTROPHORESIS
IgA: 57 mg/dL — ABNORMAL LOW (ref 64–422)
IgG (Immunoglobin G), Serum: 3826 mg/dL — ABNORMAL HIGH (ref 586–1602)
IgM (Immunoglobulin M), Srm: 16 mg/dL — ABNORMAL LOW (ref 26–217)
Total Protein ELP: 8 g/dL (ref 6.0–8.5)

## 2021-12-22 ENCOUNTER — Inpatient Hospital Stay (HOSPITAL_BASED_OUTPATIENT_CLINIC_OR_DEPARTMENT_OTHER): Payer: Medicare Other | Admitting: Hematology

## 2021-12-22 VITALS — BP 138/60 | HR 57 | Temp 98.3°F | Resp 18 | Wt 104.6 lb

## 2021-12-22 DIAGNOSIS — Z79899 Other long term (current) drug therapy: Secondary | ICD-10-CM | POA: Diagnosis not present

## 2021-12-22 DIAGNOSIS — D649 Anemia, unspecified: Secondary | ICD-10-CM | POA: Diagnosis not present

## 2021-12-22 DIAGNOSIS — E042 Nontoxic multinodular goiter: Secondary | ICD-10-CM | POA: Diagnosis not present

## 2021-12-22 DIAGNOSIS — D472 Monoclonal gammopathy: Secondary | ICD-10-CM | POA: Diagnosis not present

## 2021-12-22 DIAGNOSIS — F17210 Nicotine dependence, cigarettes, uncomplicated: Secondary | ICD-10-CM | POA: Diagnosis not present

## 2021-12-22 DIAGNOSIS — M7989 Other specified soft tissue disorders: Secondary | ICD-10-CM | POA: Diagnosis not present

## 2021-12-22 DIAGNOSIS — C3412 Malignant neoplasm of upper lobe, left bronchus or lung: Secondary | ICD-10-CM | POA: Diagnosis not present

## 2021-12-22 NOTE — Patient Instructions (Addendum)
[place] at [facility] Discharge Instructions  You were seen and examined today by Dr. Delton Coombes.  Dr. Delton Coombes discussed your most recent lab work and Thyroid US results.  Dr. Delton Coombes is going to schedule you to have a thyroid biopsy to see if there is any cancer.   Follow-up as scheduled.    Thank you for choosing [place] at [facility] to provide your oncology and hematology care.  To afford each patient quality time with our provider, please arrive at least 15 minutes before your scheduled appointment time.   If you have a lab appointment with the [facility] please come in thru the Main Entrance and check in at the main information desk.  You need to re-schedule your appointment should you arrive 10 or more minutes late.  We strive to give you quality time with our providers, and arriving late affects you and other patients whose appointments are after yours.  Also, if you no show three or more times for appointments you may be dismissed from the clinic at the providers discretion.     Again, thank you for choosing [facility].  Our hope is that these requests will decrease the amount of time that you wait before being seen by our physicians.       _____________________________________________________________  Should you have questions after your visit to [facility], please contact our office at [phone] and follow the prompts.  Our office hours are 8:00 a.m. and 4:30 p.m. Monday - Friday.  Please note that voicemails left after 4:00 p.m. may not be returned until the following business day.  We are closed weekends and major holidays.  You do have access to a nurse 24-7, just call the main number to the clinic [phone] and do not press any options, hold on the line and a nurse will answer the phone.    For prescription refill requests, have your pharmacy contact our office and allow 72 hours.     Due to Covid, you will need to wear a mask upon entering the hospital. If you do not have a mask, a mask will be given to you at the Main Entrance upon arrival. For doctor visits, patients may have 1 support person age 37 or older with them. For treatment visits, patients can not have anyone with them due to social distancing guidelines and our immunocompromised population.

## 2021-12-22 NOTE — Progress Notes (Signed)
Lisa [name], [place] [address]   CLINIC:  Medical Oncology/Hematology  PCP:  Lisa Meiers, MD [place] / [place] [address] [phone]   REASON FOR VISIT:  Follow-up for MGUS and left lung cancer  PRIOR THERAPY: none  CURRENT THERAPY: suveillance  BRIEF ONCOLOGIC HISTORY:  Oncology History   No history exists.    CANCER STAGING: Cancer Staging  Adenocarcinoma of left lung [facility]) Staging form: Lung, AJCC 8th Edition - Clinical stage from 09/11/2021: cT1, cN0, cM0 - Unsigned   INTERVAL HISTORY:  [name], a 87 y.o. female, returns for routine follow-up of her MGUS and left lung cancer. Lisa [name] was last seen on 12/15/2021.   Today she reports feeling good. She reports continued swelling of her left arm. She denies current bleeding.   REVIEW OF SYSTEMS:  Review of Systems  Constitutional:  Negative for appetite change and fatigue.  HENT:   Negative for nosebleeds.   Respiratory:  Positive for shortness of breath (COPD). Negative for hemoptysis.   Cardiovascular:  Positive for leg swelling (L arm).  Gastrointestinal:  Negative for blood in stool.  Genitourinary:  Negative for hematuria and vaginal bleeding.   Hematological:  Does not bruise/bleed easily.  All other systems reviewed and are negative.   PAST MEDICAL/SURGICAL HISTORY:  Past Medical History:  Diagnosis Date   Anemia    Carotid artery stenosis    CKD (chronic kidney disease)    COPD (chronic obstructive pulmonary disease) (HCC)    Dyspnea    Due to COPD per patient   Goiter diffuse, nontoxic    Per patient   Hepatitis C    History of radiation therapy    Left lung-10/10/21-10/17/21- Dr. Gery Pray   Hypertension    Monoclonal gammopathy of unknown significance (MGUS)    Pyelonephritis 04/06/2014   UTI   Past Surgical History:  Procedure Laterality Date   ABDOMINAL HYSTERECTOMY     CHOLECYSTECTOMY N/A 09/17/2018    Procedure: LAPAROSCOPIC CHOLECYSTECTOMY;  Surgeon: Virl Cagey, MD;  Location: [facility];  Service: General;  Laterality: N/A;   FUDUCIAL PLACEMENT Left 09/01/2021   Procedure: PLACEMENT OF FIDUCIAL MARKERS TIMES FOUR;  Surgeon: Melrose Nakayama, MD;  Location: [place];  Service: Thoracic;  Laterality: Left;   TEE WITHOUT CARDIOVERSION N/A 04/10/2014   Procedure: TRANSESOPHAGEAL ECHOCARDIOGRAM (TEE);  Surgeon: [name], MD;  Location: [facility];  Service: Cardiovascular;  Laterality: N/A;   VIDEO BRONCHOSCOPY WITH ENDOBRONCHIAL NAVIGATION N/A 09/01/2021   Procedure: VIDEO BRONCHOSCOPY WITH ENDOBRONCHIAL NAVIGATION;  Surgeon: Melrose Nakayama, MD;  Location: [place];  Service: Thoracic;  Laterality: N/A;    SOCIAL HISTORY:  Social History   Socioeconomic History   Marital status: Widowed    Spouse name: Not on file   Number of children: Not on file   Years of education: Not on file   Highest education level: Not on file  Occupational History   Not on file  Tobacco Use   Smoking status: Every Day    Packs/day: 0.25    Types: Cigarettes   Smokeless tobacco: Never  Vaping Use   Vaping Use: Never used  Substance and Sexual Activity   Alcohol use: No   Drug use: No   Sexual activity: Not on file  Other Topics Concern   Not on file  Social History Narrative   Not on file   Social Determinants of Health   Financial Resource Strain:  Medium Risk (09/21/2021)   Overall Financial Resource Strain (CARDIA)    Difficulty of Paying Living Expenses: Somewhat hard  Food Insecurity: Not on file  Transportation Needs: Not on file  Physical Activity: Inactive (09/21/2021)   Exercise Vital Sign    Days of Exercise per Week: 0 days    Minutes of Exercise per Session: 0 min  Stress: Not on file  Social Connections: Not on file  Intimate Partner Violence: Not on file    FAMILY HISTORY:  Family History  Problem Relation Age of Onset   Hypertension Mother    Thyroid disease  Mother    Hypertension Father    Thyroid disease Sister    Thyroid disease Brother    Thyroid disease Sister     CURRENT MEDICATIONS:  Current Outpatient Medications  Medication Sig Dispense Refill   amLODipine (NORVASC) 10 MG tablet Take 1 tablet (10 mg total) by mouth daily. 30 tablet 2   aspirin 81 MG EC tablet Take 81 mg by mouth in the morning.     calcium carbonate (OS-CAL - DOSED IN MG OF ELEMENTAL CALCIUM) 1250 (500 Ca) MG tablet Take 1 tablet by mouth daily with breakfast.     carvedilol (COREG) 25 MG tablet Take 1 tablet (25 mg total) by mouth 2 (two) times daily with a meal. 60 tablet 2   Cholecalciferol (VITAMIN D3) 125 MCG (5000 UT) CAPS Take 5,000 Units by mouth daily.     feeding supplement, ENSURE COMPLETE, (ENSURE COMPLETE) LIQD Take 237 mLs by mouth 2 (two) times daily between meals.     furosemide (LASIX) 20 MG tablet Take 20 mg by mouth 2 (two) times daily. Morning & afternoon     Ipratropium-Albuterol (COMBIVENT) 20-100 MCG/ACT AERS respimat Inhale 1 puff into the lungs every 6 (six) hours as needed for wheezing or shortness of breath.      ipratropium-albuterol (DUONEB) 0.5-2.5 (3) MG/3ML SOLN Take 3 mLs by nebulization every 4 (four) hours as needed (wheezing/shortness of breath).     isosorbide mononitrate (IMDUR) 30 MG 24 hr tablet Take 1 tablet (30 mg total) by mouth daily. 30 tablet 2   megestrol (MEGACE) 20 MG tablet Take 20 mg by mouth daily.     nitroGLYCERIN (NITROSTAT) 0.4 MG SL tablet Place 1 tablet (0.4 mg total) under the tongue every 5 (five) minutes as needed for chest pain. (Patient not taking: Reported on 12/15/2021) 100 tablet 1   ondansetron (ZOFRAN) 4 MG tablet Take 1 tablet (4 mg total) by mouth every 6 (six) hours as needed for nausea. (Patient not taking: Reported on 12/15/2021) 20 tablet 0   No current facility-administered medications for this visit.    ALLERGIES:  No Known Allergies  PHYSICAL EXAM:  Performance status (ECOG): 2 - Symptomatic,  <50% confined to bed  There were no vitals filed for this visit. Wt Readings from Last 3 Encounters:  12/15/21 115 lb 8 oz (52.4 kg)  12/05/21 110 lb 6.4 oz (50.1 kg)  09/27/21 105 lb (47.6 kg)   Physical Exam Vitals reviewed.  Constitutional:      Appearance: Normal appearance.     Comments: In wheelchair  Cardiovascular:     Rate and Rhythm: Normal rate and regular rhythm.     Pulses: Normal pulses.     Heart sounds: Normal heart sounds.  Pulmonary:     Effort: Pulmonary effort is normal.     Breath sounds: Normal breath sounds.  Neurological:     General: No focal  deficit present.     Mental Status: She is alert and oriented to person, place, and time.  Psychiatric:        Mood and Affect: Mood normal.        Behavior: Behavior normal.      LABORATORY DATA:  I have reviewed the labs as listed.     Latest Ref Rng & Units 12/15/2021   11:30 AM 12/05/2021    2:08 PM 09/06/2021    3:05 AM  CBC  WBC 4.0 - 10.5 K/uL 2.4  2.8  3.8   Hemoglobin 12.0 - 15.0 g/dL 8.9  9.2  8.6   Hematocrit 36.0 - 46.0 % 28.3  29.0  26.3   Platelets 150 - 400 K/uL 105  106  92       Latest Ref Rng & Units 12/15/2021   11:30 AM 12/05/2021    2:08 PM 09/06/2021    3:05 AM  CMP  Glucose 70 - 99 mg/dL 100  94  101   BUN 8 - 23 mg/dL 31  27  86   Creatinine 0.44 - 1.00 mg/dL 1.30  1.33  1.89   Sodium 135 - 145 mmol/L 138  140  137   Potassium 3.5 - 5.1 mmol/L 4.3  4.0  4.2   Chloride 98 - 111 mmol/L 107  109  102   CO2 22 - 32 mmol/L 27  26  29    Calcium 8.9 - 10.3 mg/dL 8.7  8.8  8.8   Total Protein 6.5 - 8.1 g/dL 8.5  8.8    Total Bilirubin 0.3 - 1.2 mg/dL 0.5  0.6    Alkaline Phos 38 - 126 U/L 41  41    AST 15 - 41 U/L 26  25    ALT 0 - 44 U/L 17  17      DIAGNOSTIC IMAGING:  I have independently reviewed the scans and discussed with the patient. US THYROID  Result Date: 12/21/2021 CLINICAL DATA:  Goiter. EXAM: THYROID ULTRASOUND TECHNIQUE: Ultrasound examination of the thyroid gland  and adjacent soft tissues was performed. COMPARISON:  None Available. FINDINGS: Parenchymal Echotexture: Markedly heterogenous Isthmus: 1.2 cm Right lobe: 8.6 x 4.6 x 4.2 cm Left lobe: 8.1 x 5.0 x 5.7 cm _________________________________________________________ Estimated total number of nodules >/= 1 cm: Number of spongiform nodules >/=  2 cm not described below (TR1): 0 Number of mixed cystic and solid nodules >/= 1.5 cm not described below (TR2): 0 _________________________________________________________ Nodule labeled 1 is a solid isoechoic TR 3 nodule in the thyroid isthmus that measures 4.6 x 3.8 x 3.8 cm. **Given size (>/= 2.5 cm) and appearance, fine needle aspiration of this mildly suspicious nodule should be considered based on TI-RADS criteria. Nodule labeled 2 appears to be a small heterogeneous area/pseudo nodule in the thyroid isthmus. Nodule labeled 3 is a solid isoechoic TR 3 nodule in the superior right thyroid lobe that measures 2.3 x 1.8 x 1.3 cm. *Given size (>/= 1.5 - 2.4 cm) and appearance, a follow-up ultrasound in 1 year should be considered based on TI-RADS criteria. Nodule labeled 4 is a solid and heterogeneous but predominantly isoechoic nodule with macrocalcification (TR 4) in the mid right thyroid lobe that measures 5.2 x 4.7 x 3.5 cm. **Given size (>/= 1.5 cm) and appearance, fine needle aspiration of this moderately suspicious nodule should be considered based on TI-RADS criteria. Nodule labeled 5 is a solid isoechoic TR 3 nodule in the superior left thyroid lobe that  measures 3.1 x 2.3 x 2.1 cm. **Given size (>/= 2.5 cm) and appearance, fine needle aspiration of this mildly suspicious nodule should be considered based on TI-RADS criteria. Nodule labeled 6 is a solid isoechoic nodule with macrocalcification (TR 4) in the mid left thyroid lobe that measures 3.0 x 2.9 x 2.6 cm. **Given size (>/= 1.5 cm) and appearance, fine needle aspiration of this moderately suspicious nodule should be  considered based on TI-RADS criteria. Nodule labeled 7 is a large solid isoechoic TR 3 nodule in the inferior left thyroid lobe that measures 6.2 x 4.7 x 4.6 cm. **Given size (>/= 2.5 cm) and appearance, fine needle aspiration of this mildly suspicious nodule should be considered based on TI-RADS criteria. IMPRESSION: 1. Markedly enlarged multinodular thyroid gland. 2. Nodules labeled 1, 4, 5, 6 and 7 all meet criteria for biopsy. Per ACR TI-RADS criteria, the 2 most suspicious thyroid nodules should be biopsied. Therefore, nodules labeled 4 and 6 are recommended for biopsy. 3. Nodule labeled 3 meets criteria for follow-up ultrasound in 1 year. Total follow-up interval of 5 years is recommended, with this exam serving as baseline. The above is in keeping with the ACR TI-RADS recommendations - J Am Coll Radiol 2017;14:587-595. Electronically Signed   By: Albin Felling M.D.   On: 12/21/2021 15:35   CT Chest W Contrast  Result Date: 12/06/2021 CLINICAL DATA:  Non-small cell lung cancer, monitor. Left arm swelling for 2.5 weeks. No known injury. History of multinodular goiter. * Tracking Code: BO * EXAM: CT CHEST WITH CONTRAST TECHNIQUE: Multidetector CT imaging of the chest was performed during intravenous contrast administration. RADIATION DOSE REDUCTION: This exam was performed according to the departmental dose-optimization program which includes automated exposure control, adjustment of the mA and/or kV according to patient size and/or use of iterative reconstruction technique. CONTRAST:  71mL OMNIPAQUE IOHEXOL 300 MG/ML  SOLN COMPARISON:  Chest CT without contrast 08/26/2021. CT neck 09/04/2021. Upper extremity venous ultrasound 11/29/2021 and 12/05/2021. FINDINGS: Cardiovascular: Extensive atherosclerosis of the aorta, great vessels and coronary arteries again noted. No acute vascular findings are identified, although there is possible ostial stenosis of the left common carotid and subclavian arteries.  Aortic valvular and mitral annular calcifications are present. The heart size is normal. There is no pericardial effusion. Mediastinum/Nodes: There are no enlarged mediastinal, hilar or axillary lymph nodes.Marked heterogeneous enlargement of both thyroid lobes again noted with associated parenchymal calcifications and constriction of the trachea, similar to previous study. The esophagus appears unremarkable. Lungs/Pleura: No pleural effusion or pneumothorax. Mild centrilobular emphysema and central airway thickening. The spiculated nodule at the left lung apex has decreased in size, now measuring 1.7 x 1.3 cm on image 34/4 (previously 2.1 x 1.6 cm). Adjacent fiduciary markers. No new or enlarging nodules. Upper abdomen: The visualized upper abdomen appears stable without significant findings. Diffuse aortic and branch vessel atherosclerosis. Musculoskeletal/Chest wall: There is no chest wall mass or suspicious osseous finding. Mild spondylosis. IMPRESSION: 1. Interval response to therapy. The dominant spiculated nodule at the left lung apex has decreased in size. 2. No evidence of metastatic disease. 3. No acute vascular findings identified. Possible ostial stenosis of the left common carotid and subclavian arteries. 4. Severe multinodular goiter with resulting mass effect on the trachea, similar to previous imaging. 5. Coronary and aortic atherosclerosis (ICD10-I70.0). Emphysema (ICD10-J43.9). Electronically Signed   By: Richardean Sale M.D.   On: 12/06/2021 13:41   US Venous Img Upper Uni Left  Result Date: 12/05/2021 CLINICAL DATA:  Swelling x2 0.5 weeks, MGUS EXAM: LEFT UPPER EXTREMITY VENOUS DOPPLER ULTRASOUND TECHNIQUE: Gray-scale sonography with graded compression, as well as color Doppler and duplex ultrasound were performed to evaluate the upper extremity deep venous system from the level of the subclavian vein and including the jugular, axillary, basilic, radial, ulnar and upper cephalic vein.  Spectral Doppler was utilized to evaluate flow at rest and with distal augmentation maneuvers. COMPARISON:  11/29/2021 FINDINGS: Contralateral IJ vein: Respiratory phasicity is normal and symmetric with the symptomatic side. No evidence of thrombus. Normal compressibility. Internal Jugular Vein: No evidence of thrombus. Normal compressibility, respiratory phasicity and response to augmentation. Subclavian Vein: No evidence of thrombus. Normal compressibility, respiratory phasicity and response to augmentation. Axillary Vein: No evidence of thrombus. Normal compressibility, respiratory phasicity and response to augmentation. Cephalic Vein: No evidence of thrombus. Normal compressibility, respiratory phasicity and response to augmentation. Basilic Vein: No evidence of thrombus. Normal compressibility, respiratory phasicity and response to augmentation. Brachial Veins: No evidence of thrombus. Normal compressibility, respiratory phasicity and response to augmentation. Radial Veins: No evidence of thrombus. Normal compressibility, respiratory phasicity and response to augmentation. Ulnar Veins: No evidence of thrombus. Normal compressibility, respiratory phasicity and response to augmentation. Venous Reflux:  None visualized. Other Findings:  Subcutaneous edema in the forearm. IMPRESSION: No evidence of DVT within the left upper extremity. Electronically Signed   By: Lucrezia Europe M.D.   On: 12/05/2021 15:36   US Venous Img Upper Uni Left (DVT)  Result Date: 11/29/2021 CLINICAL DATA:  87 year old female with swelling EXAM: LEFT UPPER EXTREMITY VENOUS DOPPLER ULTRASOUND TECHNIQUE: Gray-scale sonography with graded compression, as well as color Doppler and duplex ultrasound were performed to evaluate the upper extremity deep venous system from the level of the subclavian vein and including the jugular, axillary, basilic, radial, ulnar and upper cephalic vein. Spectral Doppler was utilized to evaluate flow at rest and  with distal augmentation maneuvers. COMPARISON:  None Available. FINDINGS: Contralateral Subclavian Vein: Respiratory phasicity is normal and symmetric with the symptomatic side. No evidence of thrombus. Normal compressibility. Internal Jugular Vein: No evidence of thrombus. Normal compressibility, respiratory phasicity and response to augmentation. Subclavian Vein: No evidence of thrombus. Normal compressibility, respiratory phasicity and response to augmentation. Axillary Vein: No evidence of thrombus. Normal compressibility, respiratory phasicity and response to augmentation. Cephalic Vein: No evidence of thrombus. Normal compressibility, respiratory phasicity and response to augmentation. Basilic Vein: No evidence of thrombus. Normal compressibility, respiratory phasicity and response to augmentation. Brachial Veins: No evidence of thrombus. Normal compressibility, respiratory phasicity and response to augmentation. Radial Veins: No evidence of thrombus. Normal compressibility, respiratory phasicity and response to augmentation. Ulnar Veins: No evidence of thrombus. Normal compressibility, respiratory phasicity and response to augmentation. Other Findings:  Redemonstration of thyroid goiter IMPRESSION: Directed duplex of the left upper extremity negative for DVT. Redemonstration of thyroid goiter Signed, Dulcy Fanny. Nadene Rubins, RPVI [facility] Electronically Signed   By: Corrie Mckusick D.O.   On: 11/29/2021 08:15     ASSESSMENT:  IgG kappa smoldering myeloma: - Patient seen at the request of Dr. Theador Hawthorne for monoclonal gammopathy. - Labs on 05/12/2021 showed M spike 2.4 g.  Immunofixation shows IgG kappa monoclonal protein with a faint IgA lambda monoclonal protein. - Kappa light chains 143, lambda light chains 38, ratio of 3.72. - 24-hour urine total protein 84 mg.  Urine immunofixation shows faint IgG kappa. - No B symptoms or neuropathy. -  BMBX on 07/14/2021: Pathology shows hypocellular  marrow with clonal plasma cell population comprising 15-20% of the total marrow cellularity.  No overt dysplasia. - Cytogenetics: 45,X-X[9]/46,XX[11] - Myeloma FISH panel: Monosomy 13, duplication 1 q. - Skeletal survey on 06/21/2021: 1 cm circumscribed sclerotic lesion in the distal tibial shaft on the right, nonspecific.  No lytic bone lesion.   Social/family history: - She lives with her nephew.  She is seen with her brother today.  She uses walker to ambulate. 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 - Current active smoker, half pack per day for 62 years.  3.  Stage I (T1 N0 M0) adenocarcinoma of the left upper lobe of the lung:       - PET scan on 08/04/2021: 1.7 x 1.4 cm left upper lobe nodule SUV 18.  No hypermetabolic mediastinal or hilar adenopathy.  No evidence of metastatic disease. - Navigational bronchoscopy and biopsy by Dr. Roxan Hockey on 09/01/2021.  Not a surgical candidate per Dr. Roxan Hockey. - Pathology consistent with moderately differentiated adenocarcinoma. - SBRT to the left upper lobe lung lesion, 60 Gray in 3 fractions completed in May 2023   PLAN:  IgG kappa smoldering myeloma: - PET scan on 08/04/2021: Did not show any evidence of active myeloma.  No lytic lesions on CT portion.  No soft tissue myeloma. - Myeloma labs from 12/15/2021 were discussed.  M spike is 2.6 g, up from 2.2 g on 06/21/2021.  Free light chain ratio is 3.56, kappa light chains 96.8.  Calcium is 8.7 and creatinine is 1.30.  Hemoglobin is 8.9. - If she continues to  progressively become anemic without a reason, progression of myeloma is a possibility.  2.  Stage I (T1 N0 M0) adenocarcinoma of the left upper lobe of the lung: - CT chest with contrast (12/05/2021): Left lung apex spiculated nodule decreased in size measuring 1.7 x 1.3 cm, previously 2.1 x 1.6 cm.  No evidence of metastatic disease.  3.  Normocytic anemia: - Ferritin is  536 and the percent saturation is 30.  However hemoglobin is 8.9 and creatinine 1.3.  If it continues to go down, consider ESA.  4.  Left upper extremity swelling: - Dopplers on 11/29/2021 and 12/05/2021 were negative for DVT. - CT scan did not show any impingement.  However she has markedly enlarged both thyroid lobes left more than right with constriction of the trachea. - It is possible that the left upper extremity swelling is due to decreased venous return from mass effect of thyroid. - We have made a referral to Dr. Harlow Asa for seeking his opinion.  5.  Thyroid nodules: - I have reviewed ultrasound report which showed suspicious thyroid nodules.  Out of them 2 most suspicious thyroid nodules labeled 4 and 6 are recommended for biopsy. - I have discussed this with the patient and her daughter in detail. - We will proceed with IR guided biopsy and follow-up.   Orders placed this encounter:  Orders Placed This Encounter  Procedures   [place] FNA BX THYROID 1ST LESION AFIRMA   [place] FNA BX THYROID EA ADD [place], [place] [phone]   I, Thana Ates, am acting as a scribe for Dr. Derek Jack.  I, Derek Jack MD, have reviewed the above documentation for accuracy and completeness, and I agree with the above.

## 2021-12-23 NOTE — Progress Notes (Unsigned)
Aletta Edouard, MD  Donita Brooks D OK for FNA of nodules 4 and 6 by prior thyroid US. Can be done at [facility].   GY

## 2021-12-27 ENCOUNTER — Other Ambulatory Visit: Payer: Self-pay

## 2021-12-27 ENCOUNTER — Encounter (HOSPITAL_COMMUNITY): Payer: Self-pay

## 2021-12-27 ENCOUNTER — Emergency Department (HOSPITAL_COMMUNITY): Payer: Medicare Other

## 2021-12-27 ENCOUNTER — Inpatient Hospital Stay (HOSPITAL_COMMUNITY)
Admission: EM | Admit: 2021-12-27 | Discharge: 2021-12-31 | DRG: 177 | Disposition: A | Discharge status: Home Health | Payer: Medicare Other | Attending: Internal Medicine | Admitting: Internal Medicine

## 2021-12-27 DIAGNOSIS — Z85118 Personal history of other malignant neoplasm of bronchus and lung: Secondary | ICD-10-CM | POA: Diagnosis not present

## 2021-12-27 DIAGNOSIS — B182 Chronic viral hepatitis C: Secondary | ICD-10-CM | POA: Diagnosis present

## 2021-12-27 DIAGNOSIS — Z923 Personal history of irradiation: Secondary | ICD-10-CM | POA: Diagnosis not present

## 2021-12-27 DIAGNOSIS — Z8349 Family history of other endocrine, nutritional and metabolic diseases: Secondary | ICD-10-CM

## 2021-12-27 DIAGNOSIS — Z8249 Family history of ischemic heart disease and other diseases of the circulatory system: Secondary | ICD-10-CM

## 2021-12-27 DIAGNOSIS — Z681 Body mass index (BMI) 19 or less, adult: Secondary | ICD-10-CM | POA: Diagnosis not present

## 2021-12-27 DIAGNOSIS — R0902 Hypoxemia: Principal | ICD-10-CM

## 2021-12-27 DIAGNOSIS — M79632 Pain in left forearm: Secondary | ICD-10-CM | POA: Diagnosis not present

## 2021-12-27 DIAGNOSIS — R6889 Other general symptoms and signs: Secondary | ICD-10-CM | POA: Diagnosis not present

## 2021-12-27 DIAGNOSIS — Z7982 Long term (current) use of aspirin: Secondary | ICD-10-CM

## 2021-12-27 DIAGNOSIS — D61818 Other pancytopenia: Secondary | ICD-10-CM | POA: Diagnosis not present

## 2021-12-27 DIAGNOSIS — F17210 Nicotine dependence, cigarettes, uncomplicated: Secondary | ICD-10-CM | POA: Diagnosis present

## 2021-12-27 DIAGNOSIS — Z743 Need for continuous supervision: Secondary | ICD-10-CM | POA: Diagnosis not present

## 2021-12-27 DIAGNOSIS — D472 Monoclonal gammopathy: Secondary | ICD-10-CM

## 2021-12-27 DIAGNOSIS — R636 Underweight: Secondary | ICD-10-CM

## 2021-12-27 DIAGNOSIS — I248 Other forms of acute ischemic heart disease: Secondary | ICD-10-CM | POA: Diagnosis not present

## 2021-12-27 DIAGNOSIS — R778 Other specified abnormalities of plasma proteins: Secondary | ICD-10-CM | POA: Diagnosis not present

## 2021-12-27 DIAGNOSIS — N1832 Chronic kidney disease, stage 3b: Secondary | ICD-10-CM | POA: Diagnosis not present

## 2021-12-27 DIAGNOSIS — J9601 Acute respiratory failure with hypoxia: Secondary | ICD-10-CM | POA: Diagnosis not present

## 2021-12-27 DIAGNOSIS — Z79899 Other long term (current) drug therapy: Secondary | ICD-10-CM | POA: Diagnosis not present

## 2021-12-27 DIAGNOSIS — U071 COVID-19: Secondary | ICD-10-CM | POA: Diagnosis not present

## 2021-12-27 DIAGNOSIS — Z7951 Long term (current) use of inhaled steroids: Secondary | ICD-10-CM

## 2021-12-27 DIAGNOSIS — Z9049 Acquired absence of other specified parts of digestive tract: Secondary | ICD-10-CM | POA: Diagnosis not present

## 2021-12-27 DIAGNOSIS — Z90710 Acquired absence of both cervix and uterus: Secondary | ICD-10-CM | POA: Diagnosis not present

## 2021-12-27 DIAGNOSIS — J441 Chronic obstructive pulmonary disease with (acute) exacerbation: Secondary | ICD-10-CM | POA: Diagnosis not present

## 2021-12-27 DIAGNOSIS — I130 Hypertensive heart and chronic kidney disease with heart failure and stage 1 through stage 4 chronic kidney disease, or unspecified chronic kidney disease: Secondary | ICD-10-CM | POA: Diagnosis not present

## 2021-12-27 DIAGNOSIS — M7989 Other specified soft tissue disorders: Secondary | ICD-10-CM | POA: Diagnosis not present

## 2021-12-27 DIAGNOSIS — R627 Adult failure to thrive: Secondary | ICD-10-CM | POA: Diagnosis not present

## 2021-12-27 DIAGNOSIS — R7989 Other specified abnormal findings of blood chemistry: Secondary | ICD-10-CM | POA: Diagnosis not present

## 2021-12-27 DIAGNOSIS — R0602 Shortness of breath: Secondary | ICD-10-CM | POA: Diagnosis not present

## 2021-12-27 DIAGNOSIS — I5032 Chronic diastolic (congestive) heart failure: Secondary | ICD-10-CM | POA: Diagnosis present

## 2021-12-27 DIAGNOSIS — J9621 Acute and chronic respiratory failure with hypoxia: Secondary | ICD-10-CM | POA: Diagnosis not present

## 2021-12-27 DIAGNOSIS — I5030 Unspecified diastolic (congestive) heart failure: Secondary | ICD-10-CM | POA: Diagnosis not present

## 2021-12-27 DIAGNOSIS — R0689 Other abnormalities of breathing: Secondary | ICD-10-CM | POA: Diagnosis not present

## 2021-12-27 DIAGNOSIS — I499 Cardiac arrhythmia, unspecified: Secondary | ICD-10-CM | POA: Diagnosis not present

## 2021-12-27 LAB — RESP PANEL BY RT-PCR (FLU A&B, COVID) ARPGX2
Influenza A by PCR: NEGATIVE
Influenza B by PCR: NEGATIVE
SARS Coronavirus 2 by RT PCR: POSITIVE — AB

## 2021-12-27 LAB — BASIC METABOLIC PANEL
Anion gap: 6 (ref 5–15)
BUN: 39 mg/dL — ABNORMAL HIGH (ref 8–23)
CO2: 27 mmol/L (ref 22–32)
Calcium: 8.5 mg/dL — ABNORMAL LOW (ref 8.9–10.3)
Chloride: 104 mmol/L (ref 98–111)
Creatinine, Ser: 1.54 mg/dL — ABNORMAL HIGH (ref 0.44–1.00)
GFR, Estimated: 33 mL/min — ABNORMAL LOW (ref >60)
Glucose, Bld: 102 mg/dL — ABNORMAL HIGH (ref 70–99)
Potassium: 4.3 mmol/L (ref 3.5–5.1)
Sodium: 137 mmol/L (ref 135–145)

## 2021-12-27 LAB — CBC
HCT: 27.8 % — ABNORMAL LOW (ref 36.0–46.0)
Hemoglobin: 9 g/dL — ABNORMAL LOW (ref 12.0–15.0)
MCH: 29.9 pg (ref 26.0–34.0)
MCHC: 32.4 g/dL (ref 30.0–36.0)
MCV: 92.4 fL (ref 80.0–100.0)
Platelets: 116 10*3/uL — ABNORMAL LOW (ref 150–400)
RBC: 3.01 MIL/uL — ABNORMAL LOW (ref 3.87–5.11)
RDW: 15 % (ref 11.5–15.5)
WBC: 5.7 10*3/uL (ref 4.0–10.5)
nRBC: 0 % (ref 0.0–0.2)

## 2021-12-27 LAB — TROPONIN I (HIGH SENSITIVITY)
Troponin I (High Sensitivity): 16 ng/L (ref <18)
Troponin I (High Sensitivity): 28 ng/L — ABNORMAL HIGH (ref <18)

## 2021-12-27 LAB — BRAIN NATRIURETIC PEPTIDE: B Natriuretic Peptide: 418 pg/mL — ABNORMAL HIGH (ref 0.0–100.0)

## 2021-12-27 MED ORDER — ENOXAPARIN SODIUM 30 MG/0.3ML IJ SOSY: 30.0000 mg | PREFILLED_SYRINGE | INTRAMUSCULAR | Status: DC

## 2021-12-27 MED ORDER — ACETAMINOPHEN 325 MG PO TABS: 650.0000 mg | ORAL_TABLET | Freq: Four times a day (QID) | ORAL | Status: DC | PRN

## 2021-12-27 MED ORDER — METHYLPREDNISOLONE SODIUM SUCC 125 MG IJ SOLR
125.0000 mg | Freq: Once | INTRAMUSCULAR | Status: AC
  Administered 2021-12-27: 125 mg via INTRAVENOUS

## 2021-12-27 MED ORDER — FUROSEMIDE 40 MG PO TABS
20.0000 mg | ORAL_TABLET | Freq: Once | ORAL | Status: AC
  Administered 2021-12-27: 20 mg via ORAL
  Filled 2021-12-27: qty 1

## 2021-12-27 MED ORDER — ACETAMINOPHEN 650 MG RE SUPP: 650.0000 mg | Freq: Four times a day (QID) | RECTAL | Status: DC | PRN

## 2021-12-27 MED ORDER — ONDANSETRON HCL 4 MG/2ML IJ SOLN: 4.0000 mg | Freq: Four times a day (QID) | INTRAMUSCULAR | Status: DC | PRN

## 2021-12-27 MED ORDER — IPRATROPIUM-ALBUTEROL 0.5-2.5 (3) MG/3ML IN SOLN
3.0000 mL | Freq: Once | RESPIRATORY_TRACT | Status: AC
  Administered 2021-12-27: 3 mL via RESPIRATORY_TRACT
  Filled 2021-12-27: qty 3

## 2021-12-27 MED ORDER — ONDANSETRON HCL 4 MG PO TABS: 4.0000 mg | ORAL_TABLET | Freq: Four times a day (QID) | ORAL | Status: DC | PRN

## 2021-12-27 MED ORDER — CARVEDILOL 12.5 MG PO TABS
25.0000 mg | ORAL_TABLET | Freq: Once | ORAL | Status: AC
Start: 2021-12-27 — End: 2021-12-27
  Administered 2021-12-27: 25 mg via ORAL
  Filled 2021-12-27: qty 2

## 2021-12-27 MED ORDER — IPRATROPIUM-ALBUTEROL 0.5-2.5 (3) MG/3ML IN SOLN
3.0000 mL | Freq: Once | RESPIRATORY_TRACT | Status: AC
Start: 2021-12-27 — End: 2021-12-27
  Administered 2021-12-27: 3 mL via RESPIRATORY_TRACT
  Filled 2021-12-27: qty 3

## 2021-12-27 MED ORDER — METHYLPREDNISOLONE SODIUM SUCC 125 MG IJ SOLR
INTRAMUSCULAR | Status: AC
  Filled 2021-12-27: qty 2

## 2021-12-27 MED ORDER — IPRATROPIUM-ALBUTEROL 0.5-2.5 (3) MG/3ML IN SOLN
3.0000 mL | Freq: Four times a day (QID) | RESPIRATORY_TRACT | Status: DC
  Administered 2021-12-28 (×2): 3 mL via RESPIRATORY_TRACT
  Filled 2021-12-27 (×2): qty 3

## 2021-12-27 NOTE — H&P (Signed)
History and Physical    Patient: Lisa [name] ZOX:[id number] DOB: 11/27/1934 DOA: 12/27/2021 DOS: the patient was seen and examined on 12/28/2021 PCP: Carrolyn Meiers, MD  Patient coming from: Home  Chief Complaint:  Chief Complaint  Patient presents with   Shortness of Breath   HPI: Lisa [name] is a 87 y.o. female with medical history significant of COPD, hypertension, chronic hepatitis C, tobacco abuse, CKD stage IIIb, malignant neoplasm of left lung, smoldering myeloma who presents to the emergency department from home due to shortness of breath which started today.  She states that she has been having increased breathing problems since radiotherapy due to lung Cancer.  Patient states that she tried home inhaler without any improvement, so she activated EMS,, so she activated EMS, on arrival of EMS team, she was noted to have an O2 sat of 88% on room air (patient does not use supplemental oxygen at baseline).  Supplemental oxygen was provided with improved oxygenation.  She denies fever, chills, nausea, vomiting, abdominal pain, diarrhea.  ED Course:  In the emergency department, she was tachycardic, BP was 176/78, other vital signs are within normal range.  Work-up in the ED showed normocytic anemia, thrombocytopenia, BMP was normal except for BUN/creatinine at 39/1.54 (creatinine baseline ranged widely between 1.3-1.9), BNP 418 (no prior labs for comparison), troponin x 2 - 16 > 28.  Influenza A, B was negative.  SARS coronavirus 2 was positive. Chest x-ray showed no acute cardiopulmonary disease She was treated with Coreg, Lasix, breathing treatment, Solu-Medrol 125 mg x 1 was given. Hospitalist was asked to admit patient for further evaluation and management.  Review of Systems: Review of systems as noted in the HPI. All other systems reviewed and are negative.   Past Medical History:  Diagnosis Date   Anemia    Carotid artery stenosis    CKD (chronic kidney  disease)    COPD (chronic obstructive pulmonary disease) (HCC)    Dyspnea    Due to COPD per patient   Goiter diffuse, nontoxic    Per patient   Hepatitis C    History of radiation therapy    Left lung-10/10/21-10/17/21- Dr. Gery Pray   Hypertension    Monoclonal gammopathy of unknown significance (MGUS)    Pyelonephritis 04/06/2014   UTI   Past Surgical History:  Procedure Laterality Date   ABDOMINAL HYSTERECTOMY     CHOLECYSTECTOMY N/A 09/17/2018   Procedure: LAPAROSCOPIC CHOLECYSTECTOMY;  Surgeon: Virl Cagey, MD;  Location: [facility];  Service: General;  Laterality: N/A;   FUDUCIAL PLACEMENT Left 09/01/2021   Procedure: PLACEMENT OF FIDUCIAL MARKERS TIMES FOUR;  Surgeon: Melrose Nakayama, MD;  Location: [facility];  Service: Thoracic;  Laterality: Left;   TEE WITHOUT CARDIOVERSION N/A 04/10/2014   Procedure: TRANSESOPHAGEAL ECHOCARDIOGRAM (TEE);  Surgeon: [name], MD;  Location: [facility];  Service: Cardiovascular;  Laterality: N/A;   VIDEO BRONCHOSCOPY WITH ENDOBRONCHIAL NAVIGATION N/A 09/01/2021   Procedure: VIDEO BRONCHOSCOPY WITH ENDOBRONCHIAL NAVIGATION;  Surgeon: Melrose Nakayama, MD;  Location: [facility];  Service: Thoracic;  Laterality: N/A;    Social History:  reports that she has been smoking cigarettes. She has been smoking an average of .25 packs per day. She has never used smokeless tobacco. She reports that she does not drink alcohol and does not use drugs.   No Known Allergies  Family History  Problem Relation Age of Onset   Hypertension Mother    Thyroid disease Mother  Hypertension Father    Thyroid disease Sister    Thyroid disease Brother    Thyroid disease Sister      Prior to Admission medications   Medication Sig Start Date End Date Taking? Authorizing Provider  amLODipine (NORVASC) 10 MG tablet Take 1 tablet (10 mg total) by mouth daily. 09/06/21 11/28/21  Pokhrel, Corrie Mckusick, MD  aspirin 81 MG EC tablet Take 81 mg by mouth in the  morning.    [provider]  calcium carbonate (OS-CAL - DOSED IN MG OF ELEMENTAL CALCIUM) 1250 (500 Ca) MG tablet Take 1 tablet by mouth daily with breakfast.    [provider]  carvedilol (COREG) 25 MG tablet Take 1 tablet (25 mg total) by mouth 2 (two) times daily with a meal. 09/06/21 11/28/21  Pokhrel, Laxman, MD  Cholecalciferol (VITAMIN D3) 125 MCG (5000 UT) CAPS Take 5,000 Units by mouth daily.    [provider]  feeding supplement, ENSURE COMPLETE, (ENSURE COMPLETE) LIQD Take 237 mLs by mouth 2 (two) times daily between meals. 04/15/14   Mikhail, Velta Addison, DO  furosemide (LASIX) 20 MG tablet Take 20 mg by mouth 2 (two) times daily. Morning & afternoon 04/25/21   [provider]  Ipratropium-Albuterol (COMBIVENT) 20-100 MCG/ACT AERS respimat Inhale 1 puff into the lungs every 6 (six) hours as needed for wheezing or shortness of breath.     [provider]  ipratropium-albuterol (DUONEB) 0.5-2.5 (3) MG/3ML SOLN Take 3 mLs by nebulization every 4 (four) hours as needed (wheezing/shortness of breath).    [provider]  isosorbide mononitrate (IMDUR) 30 MG 24 hr tablet Take 1 tablet (30 mg total) by mouth daily. 09/06/21 11/28/21  Pokhrel, Corrie Mckusick, MD  megestrol (MEGACE) 20 MG tablet Take 20 mg by mouth daily. 09/28/21   [provider]  nitroGLYCERIN (NITROSTAT) 0.4 MG SL tablet Place 1 tablet (0.4 mg total) under the tongue every 5 (five) minutes as needed for chest pain. 09/06/21 09/06/22  Pokhrel, Corrie Mckusick, MD  ondansetron (ZOFRAN) 4 MG tablet Take 1 tablet (4 mg total) by mouth every 6 (six) hours as needed for nausea. 09/06/21   Pokhrel, Corrie Mckusick, MD    Physical Exam: BP (!) 165/64 (BP Location: Left Arm)   Pulse 77   Temp 99.5 F (37.5 C)   Resp 18   SpO2 100%   General: 87 y.o. year-old female ill appearing, but in no acute distress.  Alert and oriented x3. HEENT: NCAT, EOMI Neck: Supple, trachea medial Cardiovascular: Regular  rate and rhythm with no rubs or gallops. + JVD noted. +2 lower extremity edema. 2/4 pulses in all 4 extremities. Respiratory: Tachypnea and diffuse wheezing on expiration.   Abdomen: Soft, nontender nondistended with normal bowel sounds x4 quadrants. Muskuloskeletal: Left hand swelling.  No cyanosis, clubbing or edema noted bilaterally Neuro: CN II-XII intact, strength 5/5 x 4, sensation, reflexes intact Skin: No ulcerative lesions noted or rashes Psychiatry: Mood is appropriate for condition and setting          Labs on Admission:  Basic Metabolic Panel: Recent Labs  Lab 12/27/21 2026  NA 137  K 4.3  CL 104  CO2 27  GLUCOSE 102*  BUN 39*  CREATININE 1.54*  CALCIUM 8.5*   Liver Function Tests: No results for input(s): "AST", "ALT", "ALKPHOS", "BILITOT", "PROT", "ALBUMIN" in the last 168 hours. No results for input(s): "LIPASE", "AMYLASE" in the last 168 hours. No results for input(s): "AMMONIA" in the last 168 hours. CBC: Recent Labs  Lab 12/27/21  2026  WBC 5.7  HGB 9.0*  HCT 27.8*  MCV 92.4  PLT 116*   Cardiac Enzymes: No results for input(s): "CKTOTAL", "CKMB", "CKMBINDEX", "TROPONINI" in the last 168 hours.  BNP (last 3 results) Recent Labs    12/27/21 2026  BNP 418.0*    ProBNP (last 3 results) No results for input(s): "PROBNP" in the last 8760 hours.  CBG: No results for input(s): "GLUCAP" in the last 168 hours.  Radiological Exams on Admission: DG Chest 2 View  Result Date: 12/27/2021 CLINICAL DATA:  Shortness of breath. EXAM: CHEST - 2 VIEW COMPARISON:  CT chest dated December 05, 2021. Chest x-ray dated September 04, 2021. FINDINGS: The heart size and mediastinal contours are within normal limits. Normal pulmonary vascularity. Fiducial markers again noted in the left upper lobe with resolved patchy opacity seen on the prior chest x-ray, likely resolved post biopsy hemorrhage. Small left apical nodule seen on recent CT is not visualized by x-ray. No focal  consolidation, pleural effusion, or pneumothorax. No acute osseous abnormality. IMPRESSION: 1. No acute cardiopulmonary disease. Electronically Signed   By: Titus Dubin M.D.   On: 12/27/2021 21:01    EKG: I independently viewed the EKG done and my findings are as followed: Normal sinus rhythm at rate of 80 bpm with APCs and secondary repolarization abnormality  Assessment/Plan Present on Admission:  COPD with acute exacerbation ([place])  Principal Problem:   COPD with acute exacerbation ([place]) Active Problems:   Smoldering multiple myeloma   Acute respiratory failure with hypoxia ([place])   COVID-19 virus infection   Elevated brain natriuretic peptide (BNP) level   Elevated troponin   Chronic kidney disease, stage 3b (HCC)   Failure to thrive in adult   Underweight   Pain and swelling of forearm, left   Acute respiratory failure with hypoxia possibly secondary to acute viral infection due to COVID-19 with superimposed COPD with acute exacerbation Continue albuterol q.6h Continue IV Solu-Medrol mg daily Continue Paxlovid Continue vitamin-C 500 mg p.o. Daily Continue zinc 220 mg p.o. Daily Continue Protonix so prevent stress ulcer Continue ISS and hypoglycemia protocol Continue Mucinex, Robitussin and Tussionex Continue Tylenol p.r.n. for fever Continue supplemental oxygen to maintain O2 sat > or = 94% with plan to wean patient off supplemental oxygen as tolerated (of note, patient does not use oxygen at baseline) Continue incentive spirometry and flutter valve q22mn as tolerated Encourage proning, early ambulation, and side laying as tolerated Continue airborne isolation precaution  Elevated BNP r/o CHF BNP 418, this may be related to patient's kidney status; but she also presents with JVD distention Intake and output/daily weights  Continue Cardiac diet  Echocardiogram done in March 2023 showed LVEF of 70 to 75%.  No RWMA.  Moderate LVH.  G2 DD.  Elevated LAP.    Elevated  troponin possibly secondary to type II demand ischemia Troponin x2 - 16 > 28, she denies chest pain, continue to trend troponin  CKD stage 3B BUN/creatinine at 39/1.54 (creatinine baseline ranged widely between 1.3-1.9) Renally adjust medications, avoid nephrotoxic agents/dehydration/hypotension  Failure to thrive in an adult Underweight (BMI 16.88 kg/m) Protein supplement to be provided  Left hand swelling Left upper extremity ultrasound done on 12/05/2021 showed no evidence of DVT within the left upper extremity  Smoldering myeloma  IgG Kappa smoldering myeloma.   Patient follows up with Dr. KDelton Coombes he is currently on observation.  Adenocarcinoma of lung ( Stage I (T1 N0 M0) adenocarcinoma of the left upper lobe of the  lung) Patient follows with Dr. Delton Coombes.  No evidence of metastatic disease. Last follow-up was 12/22/2021   Tobacco abuse Patient was counseled on tobacco abuse cessation Continue nicotine patch   DVT prophylaxis: Lovenox  Code Status: Full code   Consults: None  Family Communication: None at bedside  Severity of Illness: The appropriate patient status for this patient is OBSERVATION. Observation status is judged to be reasonable and necessary in order to provide the required intensity of service to ensure the patient's safety. The patient's presenting symptoms, physical exam findings, and initial radiographic and laboratory data in the context of their medical condition is felt to place them at decreased risk for further clinical deterioration. Furthermore, it is anticipated that the patient will be medically stable for discharge from the hospital within 2 midnights of admission.   Author: Bernadette Hoit, DO 12/28/2021 4:22 AM  For on call review [email or web address].

## 2021-12-27 NOTE — ED Triage Notes (Signed)
Pt brought in by [facility] for c/o breathing difficulties, pt has been diagnosed with stage 2 lung cancer

## 2021-12-27 NOTE — ED Provider Notes (Signed)
[facility] Provider Note   CSN: [id number] Arrival date & time: 12/27/21  1835     History {Add pertinent medical, surgical, social history, OB history to HPI:1} Chief Complaint  Patient presents with  . Shortness of Breath    Lisa [name] is a 87 y.o. female.   Shortness of Breath Associated symptoms: no abdominal pain, no chest pain, no cough, no ear pain, no fever, no rash, no sore throat and no vomiting    87 year old female presents emergency department with complaints of shortness of breath.  She has a past medical history significant for COPD, CKD, hypertension, NSTEMI, anemia, malignant neoplasm of left lung.  She states her shortness of breath began earlier today.  She tried her at home inhalers with minimal to no relief.  Upon EMS arrival she was satting 88% on room air.  She usually does not wear oxygen at home.  She is a known cigarette smoker.  She denies associated chest pain.  She notes left upper extremity swelling which has been evaluated by oncologist.  They performed a CT chest which showed no impingement of vasculature on 12/05/2021.  Ultrasound on affected extremity performed on 12/05/2021 showed no evidence of DVT in affected extremity.  Denies abdominal pain, nausea/vomiting/diarrhea, urinary/vaginal symptoms, change in bowel habits.  She just recently completed treatment for her adenocarcinoma of left lung 2 months ago.  Home Medications Prior to Admission medications   Medication Sig Start Date End Date Taking? Authorizing Provider  amLODipine (NORVASC) 10 MG tablet Take 1 tablet (10 mg total) by mouth daily. 09/06/21 11/28/21  Pokhrel, Corrie Mckusick, MD  aspirin 81 MG EC tablet Take 81 mg by mouth in the morning.    [provider]  calcium carbonate (OS-CAL - DOSED IN MG OF ELEMENTAL CALCIUM) 1250 (500 Ca) MG tablet Take 1 tablet by mouth daily with breakfast.    [provider]  carvedilol (COREG) 25 MG tablet Take 1 tablet  (25 mg total) by mouth 2 (two) times daily with a meal. 09/06/21 11/28/21  Pokhrel, Laxman, MD  Cholecalciferol (VITAMIN D3) 125 MCG (5000 UT) CAPS Take 5,000 Units by mouth daily.    [provider]  feeding supplement, ENSURE COMPLETE, (ENSURE COMPLETE) LIQD Take 237 mLs by mouth 2 (two) times daily between meals. 04/15/14   Mikhail, Velta Addison, DO  furosemide (LASIX) 20 MG tablet Take 20 mg by mouth 2 (two) times daily. Morning & afternoon 04/25/21   [provider]  Ipratropium-Albuterol (COMBIVENT) 20-100 MCG/ACT AERS respimat Inhale 1 puff into the lungs every 6 (six) hours as needed for wheezing or shortness of breath.     [provider]  ipratropium-albuterol (DUONEB) 0.5-2.5 (3) MG/3ML SOLN Take 3 mLs by nebulization every 4 (four) hours as needed (wheezing/shortness of breath).    [provider]  isosorbide mononitrate (IMDUR) 30 MG 24 hr tablet Take 1 tablet (30 mg total) by mouth daily. 09/06/21 11/28/21  Pokhrel, Corrie Mckusick, MD  megestrol (MEGACE) 20 MG tablet Take 20 mg by mouth daily. 09/28/21   [provider]  nitroGLYCERIN (NITROSTAT) 0.4 MG SL tablet Place 1 tablet (0.4 mg total) under the tongue every 5 (five) minutes as needed for chest pain. 09/06/21 09/06/22  Pokhrel, Corrie Mckusick, MD  ondansetron (ZOFRAN) 4 MG tablet Take 1 tablet (4 mg total) by mouth every 6 (six) hours as needed for nausea. 09/06/21   Flora Lipps, MD      Allergies    Patient has no known  allergies.    Review of Systems   Review of Systems  Constitutional:  Negative for chills and fever.  HENT:  Negative for ear pain and sore throat.   Eyes:  Negative for pain and visual disturbance.  Respiratory:  Positive for shortness of breath. Negative for cough.   Cardiovascular:  Negative for chest pain and palpitations.  Gastrointestinal:  Negative for abdominal pain and vomiting.  Genitourinary:  Negative for dysuria and hematuria.  Musculoskeletal:  Negative for arthralgias and  back pain.  Skin:  Negative for color change and rash.  Neurological:  Negative for seizures and syncope.  All other systems reviewed and are negative.   Physical Exam Updated Vital Signs BP (!) 167/63   Pulse 81   Temp 98.8 F (37.1 C) (Oral)   Resp (!) 24   SpO2 98%  Physical Exam Vitals and nursing note reviewed.  Constitutional:      General: She is not in acute distress.    Appearance: She is well-developed. She is not ill-appearing.  HENT:     Head: Normocephalic and atraumatic.  Eyes:     Extraocular Movements: Extraocular movements intact.     Conjunctiva/sclera: Conjunctivae normal.  Cardiovascular:     Rate and Rhythm: Normal rate and regular rhythm.     Heart sounds: No murmur heard. Pulmonary:     Effort: Pulmonary effort is normal. No respiratory distress.     Breath sounds: Wheezing and rhonchi present.     Comments: Diffuse wheeze and rhonchi auscultated on exam. Abdominal:     Palpations: Abdomen is soft.     Tenderness: There is no abdominal tenderness.  Musculoskeletal:        General: No swelling.     Cervical back: Normal range of motion and neck supple.     Right lower leg: Edema present.     Left lower leg: Edema present.     Comments: Bilateral lower 1+ pitting edema.  Patient states this is her baseline.  Skin:    General: Skin is warm and dry.     Capillary Refill: Capillary refill takes less than 2 seconds.  Neurological:     Mental Status: She is alert.  Psychiatric:        Mood and Affect: Mood normal.    ED Results / Procedures / Treatments   Labs (all labs ordered are listed, but only abnormal results are displayed) Labs Reviewed  BASIC METABOLIC PANEL - Abnormal; Notable for the following components:      Result Value   Glucose, Bld 102 (*)    BUN 39 (*)    Creatinine, Ser 1.54 (*)    Calcium 8.5 (*)    GFR, Estimated 33 (*)    All other components within normal limits  CBC    EKG None  Radiology No results  found.  Procedures Procedures  {Document cardiac monitor, telemetry assessment procedure when appropriate:1}  Medications Ordered in ED Medications  ipratropium-albuterol (DUONEB) 0.5-2.5 (3) MG/3ML nebulizer solution 3 mL (has no administration in time range)  carvedilol (COREG) tablet 25 mg (has no administration in time range)    ED Course/ Medical Decision Making/ A&P                           Medical Decision Making Amount and/or Complexity of Data Reviewed Labs: ordered. Radiology: ordered.  Risk Prescription drug management.   This patient presents to the ED for concern of shortness of  breath, this involves an extensive number of treatment options, and is a complaint that carries with it a high risk of complications and morbidity.  The differential diagnosis includes The causes for shortness of breath include but are not limited to Cardiac (AHF, pericardial effusion and tamponade, arrhythmias, ischemia, etc) Respiratory (COPD, asthma, pneumonia, pneumothorax, primary pulmonary hypertension, PE/VQ mismatch) Hematological (anemia) Neuromuscular (ALS, Guillain-Barr, etc)    Co morbidities that complicate the patient evaluation  See HPI   Additional history obtained:  Additional history obtained from left lower extremity ultrasound from 12/05/2021 External records from outside source obtained and reviewed including no evidence of left upper extremity DVT. Echocardiogram from 09/03/2021 indicated ejection fraction of 77% with normal systolic right ventricular function.   Lab Tests:  I Ordered, and personally interpreted labs.  The pertinent results include:  ***   Imaging Studies ordered:  I ordered imaging studies including chest x-ray I independently visualized and interpreted imaging which showed no acute cardiopulmonary abnormality I agree with the radiologist interpretation   Cardiac Monitoring: / EKG:  The patient was maintained on a cardiac monitor.   I personally viewed and interpreted the cardiac monitored which showed an underlying rhythm of: ***   Consultations Obtained:  I requested consultation with the ***,  and discussed lab and imaging findings as well as pertinent plan - they recommend: ***   Problem List / ED Course / Critical interventions / Medication management  *** I ordered medication including ***  for ***  Reevaluation of the patient after these medicines showed that the patient {resolved/improved/worsened:23923::"improved"} I have reviewed the patients home medicines and have made adjustments as needed   Social Determinants of Health:  Current cigarette use.  Denies alcohol or illicit drug use.   Test / Admission - Considered:  ***   {Document critical care time when appropriate:1} {Document review of labs and clinical decision tools ie heart score, Chads2Vasc2 etc:1}  {Document your independent review of radiology images, and any outside records:1} {Document your discussion with family members, caretakers, and with consultants:1} {Document social determinants of health affecting pt's care:1} {Document your decision making why or why not admission, treatments were needed:1} Final Clinical Impression(s) / ED Diagnoses Final diagnoses:  None    Rx / DC Orders ED Discharge Orders     None

## 2021-12-27 NOTE — ED Triage Notes (Signed)
Pt arrives via [facility] for SOB. Pt reports this started approximately 2-3 hours ago. Pt initial O2 sat with EMS 88% on RA, improved on O2 to 99%. Initial RA O2 sat on arrival 96%, placed on 2 lpm via [place] for comfort. Pt denies CP. Pt tachypneic, using accessory muscles to breathe. Pt has hx of COPD and reports newly diagnosed cancer in her lungs.

## 2021-12-28 ENCOUNTER — Observation Stay (HOSPITAL_COMMUNITY): Payer: Medicare Other

## 2021-12-28 ENCOUNTER — Other Ambulatory Visit: Payer: Self-pay

## 2021-12-28 DIAGNOSIS — J9601 Acute respiratory failure with hypoxia: Secondary | ICD-10-CM

## 2021-12-28 DIAGNOSIS — R636 Underweight: Secondary | ICD-10-CM

## 2021-12-28 DIAGNOSIS — R627 Adult failure to thrive: Secondary | ICD-10-CM

## 2021-12-28 DIAGNOSIS — R778 Other specified abnormalities of plasma proteins: Secondary | ICD-10-CM

## 2021-12-28 DIAGNOSIS — R7989 Other specified abnormal findings of blood chemistry: Secondary | ICD-10-CM

## 2021-12-28 DIAGNOSIS — I5030 Unspecified diastolic (congestive) heart failure: Secondary | ICD-10-CM | POA: Diagnosis not present

## 2021-12-28 DIAGNOSIS — J441 Chronic obstructive pulmonary disease with (acute) exacerbation: Secondary | ICD-10-CM | POA: Diagnosis not present

## 2021-12-28 DIAGNOSIS — U071 COVID-19: Secondary | ICD-10-CM

## 2021-12-28 DIAGNOSIS — M79632 Pain in left forearm: Secondary | ICD-10-CM

## 2021-12-28 DIAGNOSIS — N1832 Chronic kidney disease, stage 3b: Secondary | ICD-10-CM

## 2021-12-28 LAB — CBC
HCT: 27.1 % — ABNORMAL LOW (ref 36.0–46.0)
Hemoglobin: 8.5 g/dL — ABNORMAL LOW (ref 12.0–15.0)
MCH: 29.4 pg (ref 26.0–34.0)
MCHC: 31.4 g/dL (ref 30.0–36.0)
MCV: 93.8 fL (ref 80.0–100.0)
Platelets: 102 10*3/uL — ABNORMAL LOW (ref 150–400)
RBC: 2.89 MIL/uL — ABNORMAL LOW (ref 3.87–5.11)
RDW: 15.1 % (ref 11.5–15.5)
WBC: 4.4 10*3/uL (ref 4.0–10.5)
nRBC: 0 % (ref 0.0–0.2)

## 2021-12-28 LAB — IRON AND TIBC
Iron: 21 ug/dL — ABNORMAL LOW (ref 28–170)
Saturation Ratios: 8 % — ABNORMAL LOW (ref 10.4–31.8)
TIBC: 255 ug/dL (ref 250–450)
UIBC: 234 ug/dL

## 2021-12-28 LAB — COMPREHENSIVE METABOLIC PANEL
ALT: 17 U/L (ref 0–44)
AST: 28 U/L (ref 15–41)
Albumin: 2.8 g/dL — ABNORMAL LOW (ref 3.5–5.0)
Alkaline Phosphatase: 36 U/L — ABNORMAL LOW (ref 38–126)
Anion gap: 7 (ref 5–15)
BUN: 39 mg/dL — ABNORMAL HIGH (ref 8–23)
CO2: 27 mmol/L (ref 22–32)
Calcium: 8.5 mg/dL — ABNORMAL LOW (ref 8.9–10.3)
Chloride: 103 mmol/L (ref 98–111)
Creatinine, Ser: 1.52 mg/dL — ABNORMAL HIGH (ref 0.44–1.00)
GFR, Estimated: 34 mL/min — ABNORMAL LOW (ref >60)
Glucose, Bld: 123 mg/dL — ABNORMAL HIGH (ref 70–99)
Potassium: 4 mmol/L (ref 3.5–5.1)
Sodium: 137 mmol/L (ref 135–145)
Total Bilirubin: 0.5 mg/dL (ref 0.3–1.2)
Total Protein: 8.1 g/dL (ref 6.5–8.1)

## 2021-12-28 LAB — ECHOCARDIOGRAM COMPLETE
AR max vel: 1.99 cm2
AV Area VTI: 1.93 cm2
AV Area mean vel: 1.7 cm2
AV Mean grad: 6 mmHg
AV Peak grad: 11.2 mmHg
Ao pk vel: 1.67 m/s
Area-P 1/2: 3.53 cm2
MV VTI: 2.23 cm2
S' Lateral: 2.5 cm
Weight: 1806.01 oz

## 2021-12-28 LAB — GLUCOSE, CAPILLARY
Glucose-Capillary: 147 mg/dL — ABNORMAL HIGH (ref 70–99)
Glucose-Capillary: 192 mg/dL — ABNORMAL HIGH (ref 70–99)
Glucose-Capillary: 152 mg/dL — ABNORMAL HIGH (ref 70–99)
Glucose-Capillary: 146 mg/dL — ABNORMAL HIGH (ref 70–99)

## 2021-12-28 LAB — TROPONIN I (HIGH SENSITIVITY): Troponin I (High Sensitivity): 118 ng/L (ref <18)

## 2021-12-28 LAB — APTT: aPTT: 28 seconds (ref 24–36)

## 2021-12-28 LAB — PHOSPHORUS: Phosphorus: 4 mg/dL (ref 2.5–4.6)

## 2021-12-28 LAB — MAGNESIUM: Magnesium: 2.1 mg/dL (ref 1.7–2.4)

## 2021-12-28 LAB — FERRITIN: Ferritin: 450 ng/mL — ABNORMAL HIGH (ref 11–307)

## 2021-12-28 MED ORDER — METHYLPREDNISOLONE SODIUM SUCC 40 MG IJ SOLR
0.5000 mg/kg | Freq: Two times a day (BID) | INTRAMUSCULAR | Status: AC
  Administered 2021-12-28 – 2021-12-30 (×6): 23.6 mg via INTRAVENOUS
  Filled 2021-12-28 (×6): qty 1

## 2021-12-28 MED ORDER — GUAIFENESIN-DM 100-10 MG/5ML PO SYRP
10.0000 mL | ORAL_SOLUTION | ORAL | Status: DC | PRN
Start: 2021-12-28 — End: 2021-12-31

## 2021-12-28 MED ORDER — ENOXAPARIN SODIUM 30 MG/0.3ML IJ SOSY
30.0000 mg | PREFILLED_SYRINGE | INTRAMUSCULAR | Status: DC
Start: 2021-12-28 — End: 2021-12-29
  Administered 2021-12-28 – 2021-12-29 (×2): 30 mg via SUBCUTANEOUS
  Filled 2021-12-28 (×2): qty 0.3

## 2021-12-28 MED ORDER — PANTOPRAZOLE SODIUM 40 MG IV SOLR
40.0000 mg | Freq: Every day | INTRAVENOUS | Status: DC
  Administered 2021-12-28 – 2021-12-31 (×3): 40 mg via INTRAVENOUS
  Filled 2021-12-28 (×3): qty 10

## 2021-12-28 MED ORDER — ASCORBIC ACID 500 MG PO TABS
500.0000 mg | ORAL_TABLET | Freq: Every day | ORAL | Status: DC
  Administered 2021-12-28 – 2021-12-31 (×4): 500 mg via ORAL
  Filled 2021-12-28 (×4): qty 1

## 2021-12-28 MED ORDER — FUROSEMIDE 20 MG PO TABS
20.0000 mg | ORAL_TABLET | Freq: Two times a day (BID) | ORAL | Status: DC
  Administered 2021-12-28 – 2021-12-31 (×6): 20 mg via ORAL
  Filled 2021-12-28 (×6): qty 1

## 2021-12-28 MED ORDER — ZINC SULFATE 220 (50 ZN) MG PO CAPS
220.0000 mg | ORAL_CAPSULE | Freq: Every day | ORAL | Status: DC
  Administered 2021-12-28 – 2021-12-31 (×4): 220 mg via ORAL
  Filled 2021-12-28 (×4): qty 1

## 2021-12-28 MED ORDER — ASPIRIN 81 MG PO TBEC
81.0000 mg | DELAYED_RELEASE_TABLET | Freq: Every morning | ORAL | Status: DC
  Administered 2021-12-28 – 2021-12-31 (×4): 81 mg via ORAL
  Filled 2021-12-28 (×4): qty 1

## 2021-12-28 MED ORDER — HYDROCOD POLI-CHLORPHE POLI ER 10-8 MG/5ML PO SUER
5.0000 mL | Freq: Two times a day (BID) | ORAL | Status: DC | PRN
  Administered 2021-12-30 – 2021-12-31 (×2): 5 mL via ORAL
  Filled 2021-12-28 (×2): qty 5

## 2021-12-28 MED ORDER — DM-GUAIFENESIN ER 30-600 MG PO TB12
1.0000 | ORAL_TABLET | Freq: Two times a day (BID) | ORAL | Status: DC
  Administered 2021-12-28 – 2021-12-31 (×7): 1 via ORAL
  Filled 2021-12-28 (×7): qty 1

## 2021-12-28 MED ORDER — INSULIN ASPART 100 UNIT/ML IJ SOLN
0.0000 [IU] | Freq: Three times a day (TID) | INTRAMUSCULAR | Status: DC
  Administered 2021-12-28 (×2): 2 [IU] via SUBCUTANEOUS
  Administered 2021-12-28 – 2021-12-29 (×3): 1 [IU] via SUBCUTANEOUS
  Administered 2021-12-29: 2 [IU] via SUBCUTANEOUS
  Administered 2021-12-30 (×2): 1 [IU] via SUBCUTANEOUS

## 2021-12-28 MED ORDER — NICOTINE 21 MG/24HR TD PT24
21.0000 mg | MEDICATED_PATCH | Freq: Every day | TRANSDERMAL | Status: DC
  Filled 2021-12-28 (×2): qty 1

## 2021-12-28 MED ORDER — PREDNISONE 20 MG PO TABS
50.0000 mg | ORAL_TABLET | Freq: Every day | ORAL | Status: DC
  Administered 2021-12-31: 50 mg via ORAL
  Filled 2021-12-28: qty 1

## 2021-12-28 MED ORDER — SODIUM CHLORIDE 0.9 % IV SOLN
510.0000 mg | Freq: Once | INTRAVENOUS | Status: AC
  Administered 2021-12-28: 510 mg via INTRAVENOUS
  Filled 2021-12-28: qty 17

## 2021-12-28 MED ORDER — ISOSORBIDE MONONITRATE ER 60 MG PO TB24
30.0000 mg | ORAL_TABLET | Freq: Every day | ORAL | Status: DC
  Administered 2021-12-28 – 2021-12-31 (×4): 30 mg via ORAL
  Filled 2021-12-28 (×4): qty 1

## 2021-12-28 MED ORDER — CARVEDILOL 12.5 MG PO TABS
25.0000 mg | ORAL_TABLET | Freq: Two times a day (BID) | ORAL | Status: DC
Start: 2021-12-28 — End: 2021-12-31
  Administered 2021-12-28 – 2021-12-31 (×6): 25 mg via ORAL
  Filled 2021-12-28 (×6): qty 2

## 2021-12-28 MED ORDER — ALBUTEROL SULFATE HFA 108 (90 BASE) MCG/ACT IN AERS
2.0000 | INHALATION_SPRAY | Freq: Four times a day (QID) | RESPIRATORY_TRACT | Status: DC
Start: 2021-12-28 — End: 2021-12-28

## 2021-12-28 MED ORDER — ALBUTEROL SULFATE HFA 108 (90 BASE) MCG/ACT IN AERS
2.0000 | INHALATION_SPRAY | Freq: Two times a day (BID) | RESPIRATORY_TRACT | Status: DC
  Administered 2021-12-28 – 2021-12-31 (×5): 2 via RESPIRATORY_TRACT
  Filled 2021-12-28: qty 6.7

## 2021-12-28 MED ORDER — NIRMATRELVIR/RITONAVIR (PAXLOVID) TABLET (RENAL DOSING)
2.0000 | ORAL_TABLET | Freq: Two times a day (BID) | ORAL | Status: DC
  Filled 2021-12-28: qty 20

## 2021-12-28 MED ORDER — AMLODIPINE BESYLATE 5 MG PO TABS
10.0000 mg | ORAL_TABLET | Freq: Every day | ORAL | Status: DC
Start: 2021-12-28 — End: 2021-12-31
  Administered 2021-12-28 – 2021-12-31 (×4): 10 mg via ORAL
  Filled 2021-12-28 (×4): qty 2

## 2021-12-28 MED ORDER — NIRMATRELVIR/RITONAVIR (PAXLOVID)TABLET: 3.0000 | ORAL_TABLET | Freq: Two times a day (BID) | ORAL | Status: DC

## 2021-12-28 MED ORDER — FERROUS SULFATE 325 (65 FE) MG PO TABS
325.0000 mg | ORAL_TABLET | Freq: Every day | ORAL | Status: DC
  Administered 2021-12-29: 325 mg via ORAL
  Filled 2021-12-28: qty 1

## 2021-12-28 MED ORDER — ENSURE ENLIVE PO LIQD
237.0000 mL | Freq: Two times a day (BID) | ORAL | Status: DC
  Administered 2021-12-28 – 2021-12-31 (×7): 237 mL via ORAL

## 2021-12-28 MED ORDER — MOLNUPIRAVIR EUA 200MG CAPSULE
4.0000 | ORAL_CAPSULE | Freq: Two times a day (BID) | ORAL | Status: DC
Start: 2021-12-28 — End: 2021-12-31
  Administered 2021-12-28 – 2021-12-31 (×7): 800 mg via ORAL
  Filled 2021-12-28 (×2): qty 4

## 2021-12-28 NOTE — Progress Notes (Signed)
  Transition of Care [facility]) Screening Note   Patient Details  Name: Lisa [name] Date of Birth: [birth year]   Transition of Care [facility]) CM/SW Contact:    Iona Beard, [place] Phone Number: 12/28/2021, 10:38 AM    Transition of Care Department [facility]) has reviewed patient and no TOC needs have been identified at this time. We will continue to monitor patient advancement through interdisciplinary progression rounds. If new patient transition needs arise, please place a TOC consult.

## 2021-12-28 NOTE — Progress Notes (Signed)
Date and time results received: 12/28/21 0804 (use smartphrase ".now" to insert current time)  Test: troponin  Critical Value: 118  Name of Provider Notified: Shelly Coss MD   Orders Received? Or Actions Taken?:

## 2021-12-28 NOTE — Progress Notes (Signed)
PROGRESS NOTE  Lisa [name]  HIQ:[id number] DOB: Aug 21, 1934 DOA: 12/27/2021 PCP: Carrolyn Meiers, MD   Brief Narrative:  Patient is a 87 year old female with history of COPD, hypertension, chronic hepatitis C, tobacco use, CKD stage IIIb, mild neoplasm of left lung, smoldering myeloma who presents from home with complaint of shortness of breath.  EMS found her to be hypoxic on room air.  On presentation she was hypertensive.  Lab work showed elevated BNP.  COVID screen test was positive.  Chest x-ray did not show any pneumonia.  Patient was started on steroids, molnupiravir for COVID.  Also noted to have elevated troponin without chest pain, ordering echo.   Assessment & Plan:  Principal Problem:   COPD with acute exacerbation ([place]) Active Problems:   Smoldering multiple myeloma   Acute respiratory failure with hypoxia ([facility])   COVID-19 virus infection   Elevated brain natriuretic peptide (BNP) level   Elevated troponin   Chronic kidney disease, stage 3b ([facility])   Failure to thrive in adult   Underweight   Pain and swelling of forearm, left   Acute respiratory failure with hypoxia: Secondary to COVID infection/COPD exacerbation.  Continue bronchodilators.  Started on steroid, molnupiravir, vitamin C, zinc.  Continue cough medications.  She is on 2 L of oxygen at present, we will try to wean.  COPD exacerbation: Secondary to COVID illness.  Continue steroids, bronchodilators.  She is not on oxygen at home.  Much better today, free of wheezing  Diastolic congestive heart failure/Elevated troponin/elevated BNP: Denies any chest pain.  Last echo done on March  2023 showed EF of 70 to 16%, grade 2 diastolic dysfunction.  Elevated BNP.  Takes Lasix at home,will continue Denies chest pain.  Elevated troponin this morning with positive delta.  Likely demand ischemia.  We will check new echo.  We will continue to cycle troponins  CKD stage IIIb: Baseline creatinine around 1.3-1.9.   Currently kidney function at baseline.  History of smoldering myeloma/adenocarcinoma of left lung: Currently in remission.  Follows with Dr. Delton Coombes.  Will recommend outpatient follow-up  Hypertension: Patient on amlodipine, carvedilol, Lasix, Imdur at home.  Restarted   Tobacco use: Counseled cessation.  Continue nicotine patch  Normocytic anemia/thrombocytopenia: Patient has pancytopenia picture.  This is most likely secondary to history of malignancy: Smoldering myeloma/adenocarcinoma of the left lung which are currently in remission.  We will check iron studies.  No evidence of acute blood loss.        DVT prophylaxis:enoxaparin (LOVENOX) injection 30 mg Start: 12/28/21 1000 SCDs Start: 12/28/21 0258 SCDs Start: 12/27/21 2332     Code Status: Full Code  Family Communication: None at the bedside  Patient status:Inpatient  Patient is from :Home  Anticipated discharge PG:ATAO  Estimated DC date:1-2 days   Consultants: None  Procedures:None  Antimicrobials:  Anti-infectives (From admission, onward)    Start     Dose/Rate Route Frequency Ordered Stop   12/28/21 1000  nirmatrelvir/ritonavir EUA (renal dosing) (PAXLOVID) 2 tablet  Status:  Discontinued        2 tablet Oral 2 times daily 12/28/21 0309 12/28/21 0751   12/28/21 1000  molnupiravir EUA (LAGEVRIO) capsule 800 mg        4 capsule Oral 2 times daily 12/28/21 0751 01/02/22 0959   12/28/21 0315  nirmatrelvir/ritonavir EUA (PAXLOVID) 3 tablet  Status:  Discontinued        3 tablet Oral 2 times daily 12/28/21 0303 12/28/21 0309  Subjective:  Patient seen and examined at the bedside this morning.  Very comfortable, speaking in full sentences.  Denies any shortness of breath, chest pain or cough Objective: Vitals:   12/28/21 0130 12/28/21 0240 12/28/21 0611 12/28/21 0750  BP: (!) 157/63 (!) 165/64 (!) 151/59   Pulse: 68 77 72   Resp: (!) _0 Temp:  99.5 F (37.5 C) 98.8 F (37.1 C)    TempSrc:      SpO2: 99% 100% 100% 100%  Weight:   51.2 kg    No intake or output data in the 24 hours ending 12/28/21 0924 Filed Weights   12/28/21 0611  Weight: 51.2 kg    Examination:  General exam: Overall comfortable, not in distress, pleasant elderly female HEENT: PERRL Respiratory system:  no wheezes or crackles  Cardiovascular system: S1 & S2 heard, RRR.  Gastrointestinal system: Abdomen is nondistended, soft and nontender. Central nervous system: Alert and oriented Extremities: Chronic edema of the left upper extremity, no clubbing ,no cyanosis Skin: No rashes, no ulcers,no icterus     Data Reviewed: I have personally reviewed following labs and imaging studies  CBC: Recent Labs  Lab 12/27/21 2026 12/28/21 0400  WBC 5.7 4.4  HGB 9.0* 8.5*  HCT 27.8* 27.1*  MCV 92.4 93.8  PLT 116* 373*   Basic Metabolic Panel: Recent Labs  Lab 12/27/21 2026 12/28/21 0400  NA 137 137  K 4.3 4.0  CL 104 103  CO2 27 27  GLUCOSE 102* 123*  BUN 39* 39*  CREATININE 1.54* 1.52*  CALCIUM 8.5* 8.5*  MG  --  2.1  PHOS  --  4.0     Recent Results (from the past 240 hour(s))  Resp Panel by RT-PCR (Flu A&B, Covid) Anterior Nasal Swab     Status: Abnormal   Collection Time: 12/27/21  9:08 PM   Specimen: Anterior Nasal Swab  Result Value Ref Range Status   SARS Coronavirus 2 by RT PCR POSITIVE (A) NEGATIVE Final    Comment: (NOTE) SARS-CoV-2 target nucleic acids are DETECTED.  The SARS-CoV-2 RNA is generally detectable in upper respiratory specimens during the acute phase of infection. Positive results are indicative of the presence of the identified virus, but do not rule out bacterial infection or co-infection with other pathogens not detected by the test. Clinical correlation with patient history and other diagnostic information is necessary to determine patient infection status. The expected result is Negative.  Fact Sheet for  Patients: EntrepreneurPulse.com.au  Fact Sheet for Healthcare Providers: IncredibleEmployment.be  This test is not yet approved or cleared by the [place] FDA and  has been authorized for detection and/or diagnosis of SARS-CoV-2 by FDA under an Emergency Use Authorization (EUA).  This EUA will remain in effect (meaning this test can be used) for the duration of  the COVID-19 declaration under Section 564(b)(1) of the A ct, 21 U.S.C. section 360bbb-3(b)(1), unless the authorization is terminated or revoked sooner.     Influenza A by PCR NEGATIVE NEGATIVE Final   Influenza B by PCR NEGATIVE NEGATIVE Final    Comment: (NOTE) The Xpert Xpress SARS-CoV-2/FLU/RSV plus assay is intended as an aid in the diagnosis of influenza from Nasopharyngeal swab specimens and should not be used as a sole basis for treatment. Nasal washings and aspirates are unacceptable for Xpert Xpress SARS-CoV-2/FLU/RSV testing.  Fact Sheet for Patients: EntrepreneurPulse.com.au  Fact Sheet for Healthcare Providers: IncredibleEmployment.be  This test is not yet approved or cleared by the  [place] Architectural technologist and has been authorized for detection and/or diagnosis of SARS-CoV-2 by FDA under an Print production planner (EUA). This EUA will remain in effect (meaning this test can be used) for the duration of the COVID-19 declaration under Section 564(b)(1) of the Act, 21 U.S.C. section 360bbb-3(b)(1), unless the authorization is terminated or revoked.  Performed at [facility], [address] [place], [place] [address]      Radiology Studies: DG Chest 2 View  Result Date: 12/27/2021 CLINICAL DATA:  Shortness of breath. EXAM: CHEST - 2 VIEW COMPARISON:  CT chest dated December 05, 2021. Chest x-ray dated September 04, 2021. FINDINGS: The heart size and mediastinal contours are within normal limits. Normal pulmonary vascularity. Fiducial  markers again noted in the left upper lobe with resolved patchy opacity seen on the prior chest x-ray, likely resolved post biopsy hemorrhage. Small left apical nodule seen on recent CT is not visualized by x-ray. No focal consolidation, pleural effusion, or pneumothorax. No acute osseous abnormality. IMPRESSION: 1. No acute cardiopulmonary disease. Electronically Signed   By: Titus Dubin M.D.   On: 12/27/2021 21:01    Scheduled Meds:  albuterol  2 puff Inhalation Q6H   vitamin C  500 mg Oral Daily   dextromethorphan-guaiFENesin  1 tablet Oral BID   enoxaparin (LOVENOX) injection  30 mg Subcutaneous Q24H   feeding supplement  237 mL Oral BID BM   insulin aspart  0-9 Units Subcutaneous TID WC   ipratropium-albuterol  3 mL Nebulization Q6H   methylPREDNISolone (SOLU-MEDROL) injection  0.5 mg/kg Intravenous Q12H   Followed by   Derrill Memo ON 12/31/2021] predniSONE  50 mg Oral Daily   molnupiravir EUA  4 capsule Oral BID   nicotine  21 mg Transdermal Daily   pantoprazole (PROTONIX) IV  40 mg Intravenous QHS   zinc sulfate  220 mg Oral Daily   Continuous Infusions:   LOS: 0 days   Shelly Coss, MD [facility] P7/19/2023, 9:24 AM

## 2021-12-29 ENCOUNTER — Observation Stay (HOSPITAL_COMMUNITY): Payer: Medicare Other

## 2021-12-29 ENCOUNTER — Encounter (HOSPITAL_COMMUNITY): Payer: Self-pay | Admitting: Internal Medicine

## 2021-12-29 DIAGNOSIS — I130 Hypertensive heart and chronic kidney disease with heart failure and stage 1 through stage 4 chronic kidney disease, or unspecified chronic kidney disease: Secondary | ICD-10-CM | POA: Diagnosis present

## 2021-12-29 DIAGNOSIS — Z79899 Other long term (current) drug therapy: Secondary | ICD-10-CM | POA: Diagnosis not present

## 2021-12-29 DIAGNOSIS — R0602 Shortness of breath: Secondary | ICD-10-CM | POA: Diagnosis not present

## 2021-12-29 DIAGNOSIS — J9601 Acute respiratory failure with hypoxia: Secondary | ICD-10-CM | POA: Diagnosis present

## 2021-12-29 DIAGNOSIS — Z9049 Acquired absence of other specified parts of digestive tract: Secondary | ICD-10-CM | POA: Diagnosis not present

## 2021-12-29 DIAGNOSIS — B182 Chronic viral hepatitis C: Secondary | ICD-10-CM | POA: Diagnosis present

## 2021-12-29 DIAGNOSIS — I248 Other forms of acute ischemic heart disease: Secondary | ICD-10-CM | POA: Diagnosis present

## 2021-12-29 DIAGNOSIS — N1832 Chronic kidney disease, stage 3b: Secondary | ICD-10-CM | POA: Diagnosis present

## 2021-12-29 DIAGNOSIS — U071 COVID-19: Secondary | ICD-10-CM | POA: Diagnosis present

## 2021-12-29 DIAGNOSIS — Z8349 Family history of other endocrine, nutritional and metabolic diseases: Secondary | ICD-10-CM | POA: Diagnosis not present

## 2021-12-29 DIAGNOSIS — D472 Monoclonal gammopathy: Secondary | ICD-10-CM | POA: Diagnosis present

## 2021-12-29 DIAGNOSIS — Z923 Personal history of irradiation: Secondary | ICD-10-CM | POA: Diagnosis not present

## 2021-12-29 DIAGNOSIS — R627 Adult failure to thrive: Secondary | ICD-10-CM | POA: Diagnosis present

## 2021-12-29 DIAGNOSIS — M7989 Other specified soft tissue disorders: Secondary | ICD-10-CM | POA: Diagnosis present

## 2021-12-29 DIAGNOSIS — D61818 Other pancytopenia: Secondary | ICD-10-CM | POA: Diagnosis present

## 2021-12-29 DIAGNOSIS — I5032 Chronic diastolic (congestive) heart failure: Secondary | ICD-10-CM | POA: Diagnosis present

## 2021-12-29 DIAGNOSIS — Z8249 Family history of ischemic heart disease and other diseases of the circulatory system: Secondary | ICD-10-CM | POA: Diagnosis not present

## 2021-12-29 DIAGNOSIS — Z7951 Long term (current) use of inhaled steroids: Secondary | ICD-10-CM | POA: Diagnosis not present

## 2021-12-29 DIAGNOSIS — F17210 Nicotine dependence, cigarettes, uncomplicated: Secondary | ICD-10-CM | POA: Diagnosis present

## 2021-12-29 DIAGNOSIS — Z7982 Long term (current) use of aspirin: Secondary | ICD-10-CM | POA: Diagnosis not present

## 2021-12-29 DIAGNOSIS — Z85118 Personal history of other malignant neoplasm of bronchus and lung: Secondary | ICD-10-CM | POA: Diagnosis not present

## 2021-12-29 DIAGNOSIS — R636 Underweight: Secondary | ICD-10-CM | POA: Diagnosis present

## 2021-12-29 DIAGNOSIS — J441 Chronic obstructive pulmonary disease with (acute) exacerbation: Secondary | ICD-10-CM | POA: Diagnosis present

## 2021-12-29 DIAGNOSIS — Z90710 Acquired absence of both cervix and uterus: Secondary | ICD-10-CM | POA: Diagnosis not present

## 2021-12-29 DIAGNOSIS — Z681 Body mass index (BMI) 19 or less, adult: Secondary | ICD-10-CM | POA: Diagnosis not present

## 2021-12-29 DIAGNOSIS — R0902 Hypoxemia: Secondary | ICD-10-CM | POA: Diagnosis present

## 2021-12-29 LAB — GLUCOSE, CAPILLARY
Glucose-Capillary: 143 mg/dL — ABNORMAL HIGH (ref 70–99)
Glucose-Capillary: 145 mg/dL — ABNORMAL HIGH (ref 70–99)
Glucose-Capillary: 164 mg/dL — ABNORMAL HIGH (ref 70–99)
Glucose-Capillary: 130 mg/dL — ABNORMAL HIGH (ref 70–99)

## 2021-12-29 LAB — CBC WITH DIFFERENTIAL/PLATELET
Abs Immature Granulocytes: 0.03 10*3/uL (ref 0.00–0.07)
Basophils Absolute: 0 10*3/uL (ref 0.0–0.1)
Basophils Relative: 0 %
Eosinophils Absolute: 0 10*3/uL (ref 0.0–0.5)
Eosinophils Relative: 0 %
HCT: 26.4 % — ABNORMAL LOW (ref 36.0–46.0)
Hemoglobin: 8.6 g/dL — ABNORMAL LOW (ref 12.0–15.0)
Immature Granulocytes: 1 %
Lymphocytes Relative: 7 %
Lymphs Abs: 0.5 10*3/uL — ABNORMAL LOW (ref 0.7–4.0)
MCH: 29.9 pg (ref 26.0–34.0)
MCHC: 32.6 g/dL (ref 30.0–36.0)
MCV: 91.7 fL (ref 80.0–100.0)
Monocytes Absolute: 0.3 10*3/uL (ref 0.1–1.0)
Monocytes Relative: 4 %
Neutro Abs: 5.7 10*3/uL (ref 1.7–7.7)
Neutrophils Relative %: 88 %
Platelets: 107 10*3/uL — ABNORMAL LOW (ref 150–400)
RBC: 2.88 MIL/uL — ABNORMAL LOW (ref 3.87–5.11)
RDW: 14.4 % (ref 11.5–15.5)
WBC: 6.5 10*3/uL (ref 4.0–10.5)
nRBC: 0 % (ref 0.0–0.2)

## 2021-12-29 LAB — FERRITIN: Ferritin: 478 ng/mL — ABNORMAL HIGH (ref 11–307)

## 2021-12-29 LAB — D-DIMER, QUANTITATIVE: D-Dimer, Quant: 0.76 ug/mL-FEU — ABNORMAL HIGH (ref 0.00–0.50)

## 2021-12-29 LAB — COMPREHENSIVE METABOLIC PANEL
ALT: 18 U/L (ref 0–44)
AST: 23 U/L (ref 15–41)
Albumin: 2.8 g/dL — ABNORMAL LOW (ref 3.5–5.0)
Alkaline Phosphatase: 39 U/L (ref 38–126)
Anion gap: 8 (ref 5–15)
BUN: 56 mg/dL — ABNORMAL HIGH (ref 8–23)
CO2: 26 mmol/L (ref 22–32)
Calcium: 8.5 mg/dL — ABNORMAL LOW (ref 8.9–10.3)
Chloride: 100 mmol/L (ref 98–111)
Creatinine, Ser: 1.44 mg/dL — ABNORMAL HIGH (ref 0.44–1.00)
GFR, Estimated: 36 mL/min — ABNORMAL LOW (ref >60)
Glucose, Bld: 131 mg/dL — ABNORMAL HIGH (ref 70–99)
Potassium: 4.2 mmol/L (ref 3.5–5.1)
Sodium: 134 mmol/L — ABNORMAL LOW (ref 135–145)
Total Bilirubin: 0.6 mg/dL (ref 0.3–1.2)
Total Protein: 8.1 g/dL (ref 6.5–8.1)

## 2021-12-29 LAB — C-REACTIVE PROTEIN: CRP: <0.5 mg/dL (ref <1.0)

## 2021-12-29 LAB — PHOSPHORUS: Phosphorus: 4.8 mg/dL — ABNORMAL HIGH (ref 2.5–4.6)

## 2021-12-29 LAB — MAGNESIUM: Magnesium: 2.3 mg/dL (ref 1.7–2.4)

## 2021-12-29 MED ORDER — FERROUS SULFATE 325 (65 FE) MG PO TABS
325.0000 mg | ORAL_TABLET | Freq: Every day | ORAL | Status: DC
  Administered 2021-12-29 – 2021-12-31 (×3): 325 mg via ORAL
  Filled 2021-12-29 (×4): qty 1

## 2021-12-29 MED ORDER — SENNA 8.6 MG PO TABS
1.0000 | ORAL_TABLET | Freq: Two times a day (BID) | ORAL | Status: DC
  Administered 2021-12-29 – 2021-12-31 (×5): 8.6 mg via ORAL
  Filled 2021-12-29 (×5): qty 1

## 2021-12-29 MED ORDER — POLYETHYLENE GLYCOL 3350 17 G PO PACK
17.0000 g | PACK | Freq: Every day | ORAL | Status: DC
  Administered 2021-12-29 – 2021-12-30 (×2): 17 g via ORAL
  Filled 2021-12-29 (×2): qty 1

## 2021-12-29 NOTE — Progress Notes (Signed)
pt  SOB following walk with physical therapy . 2L of oxygen reapplied. Will continue to monitor O2 sat.

## 2021-12-29 NOTE — Progress Notes (Signed)
PROGRESS NOTE  Lisa [name]  OTR:[id number] DOB: June 28, 1934 DOA: 12/27/2021 PCP: Carrolyn Meiers, MD   Brief Narrative:  Patient is a 87 year old female with history of COPD, hypertension, chronic hepatitis C, tobacco use, CKD stage IIIb, mild neoplasm of left lung, smoldering myeloma who presents from home with complaint of shortness of breath.  EMS found her to be hypoxic on room air.  On presentation she was hypertensive.  Lab work showed elevated BNP.  COVID screen test was positive.  Chest x-ray did not show any pneumonia.  Patient was started on steroids, molnupiravir for COVID.     Assessment & Plan:  Principal Problem:   COPD with acute exacerbation ([place]) Active Problems:   Smoldering multiple myeloma   Acute respiratory failure with hypoxia (HCC)   COVID-19 virus infection   Elevated brain natriuretic peptide (BNP) level   Elevated troponin   Chronic kidney disease, stage 3b (HCC)   Failure to thrive in adult   Underweight   Pain and swelling of forearm, left   COVID-19   Acute respiratory failure with hypoxia: Secondary to COVID infection/COPD exacerbation.  Continue bronchodilators.  Started on steroid, molnupiravir, vitamin C, zinc.  Continue cough medications.  She is on 2 L of oxygen at present, we will try to wean. She complains of severe cough today.  Follow-up chest x-ray done on 7/20 did not show any edema or focal infiltrates. Continue inhaler,flutter valve  COPD exacerbation: Secondary to COVID illness.  Continue steroids, bronchodilators.  She is not on oxygen at home.  No wheezing is heard today  Diastolic congestive heart failure/Elevated troponin/elevated BNP:  Elevated BNP.  Takes Lasix at home,will continue Denies chest pain.  Elevated troponin  with positive delta.  Likely demand ischemia.  Echo showed EF of 65 to 70%, no wall motion abnormality.  CKD stage IIIb: Baseline creatinine around 1.3-1.9.  Currently kidney function at  baseline.  History of smoldering myeloma/adenocarcinoma of left lung: Currently in remission.  Follows with Dr. Delton Coombes.  Will recommend outpatient follow-up  Hypertension: Patient on amlodipine, carvedilol, Lasix, Imdur at home.  Restarted   Tobacco use: Counseled cessation.  Continue nicotine patch  Normocytic anemia/thrombocytopenia: Patient has pancytopenia picture.  This is most likely secondary to history of malignancy: Smoldering myeloma/adenocarcinoma of the left lung which are currently in remission.   No evidence of acute blood loss.  Iron studies showed low iron, given a dose of IV iron.  FOBT ordered but not collected.  No change in the color of the stool reported.        DVT prophylaxis:enoxaparin (LOVENOX) injection 30 mg Start: 12/28/21 1000 SCDs Start: 12/28/21 0258 SCDs Start: 12/27/21 2332     Code Status: Full Code  Family Communication: Called and discussed with niece on phone today  Patient status:Inpatient  Patient is from :Home  Anticipated discharge YB:FXOV  Estimated DC date:1-2 days   Consultants: None  Procedures:None  Antimicrobials:  Anti-infectives (From admission, onward)    Start     Dose/Rate Route Frequency Ordered Stop   12/28/21 1000  nirmatrelvir/ritonavir EUA (renal dosing) (PAXLOVID) 2 tablet  Status:  Discontinued        2 tablet Oral 2 times daily 12/28/21 0309 12/28/21 0751   12/28/21 1000  molnupiravir EUA (LAGEVRIO) capsule 800 mg        4 capsule Oral 2 times daily 12/28/21 0751 01/02/22 0959   12/28/21 0315  nirmatrelvir/ritonavir EUA (PAXLOVID) 3 tablet  Status:  Discontinued  3 tablet Oral 2 times daily 12/28/21 0303 12/28/21 0309       Subjective:  Patient seen and examined at bedside this morning.  This morning she looks uncomfortable than yesterday.  She states she could not sleep due to cough.  She was on room air during my evaluation and was saturating fine.    Objective: Vitals:   12/29/21 0406  12/29/21 0604 12/29/21 0805 12/29/21 0806  BP: (!) 168/59     Pulse: (!) 57  62   Resp: 18  20   Temp: 97.7 F (36.5 C)     TempSrc:      SpO2: 100%  99% 99%  Weight:  49.3 kg      Intake/Output Summary (Last 24 hours) at 12/29/2021 1145 Last data filed at 12/29/2021 0900 Gross per 24 hour  Intake 835.11 ml  Output --  Net 835.11 ml   Filed Weights   12/28/21 0611 12/29/21 0604  Weight: 51.2 kg 49.3 kg    Examination:  General exam: Overall comfortable, not in distress HEENT: PERRL Respiratory system: Bilateral rhonchi ,no wheezes or crackles  Cardiovascular system: S1 & S2 heard, RRR.  Gastrointestinal system: Abdomen is nondistended, soft and nontender. Central nervous system: Alert and oriented Extremities: Chronic edema of left upper extremity, no clubbing ,no cyanosis Skin: No rashes, no ulcers,no icterus     Data Reviewed: I have personally reviewed following labs and imaging studies  CBC: Recent Labs  Lab 12/27/21 2026 12/28/21 0400 12/29/21 0525  WBC 5.7 4.4 6.5  NEUTROABS  --   --  5.7  HGB 9.0* 8.5* 8.6*  HCT 27.8* 27.1* 26.4*  MCV 92.4 93.8 91.7  PLT 116* 102* 277*   Basic Metabolic Panel: Recent Labs  Lab 12/27/21 2026 12/28/21 0400 12/29/21 0525  NA 137 137 134*  K 4.3 4.0 4.2  CL 104 103 100  CO2 '27 27 26  ' GLUCOSE 102* 123* 131*  BUN 39* 39* 56*  CREATININE 1.54* 1.52* 1.44*  CALCIUM 8.5* 8.5* 8.5*  MG  --  2.1 2.3  PHOS  --  4.0 4.8*     Recent Results (from the past 240 hour(s))  Resp Panel by RT-PCR (Flu A&B, Covid) Anterior Nasal Swab     Status: Abnormal   Collection Time: 12/27/21  9:08 PM   Specimen: Anterior Nasal Swab  Result Value Ref Range Status   SARS Coronavirus 2 by RT PCR POSITIVE (A) NEGATIVE Final    Comment: (NOTE) SARS-CoV-2 target nucleic acids are DETECTED.  The SARS-CoV-2 RNA is generally detectable in upper respiratory specimens during the acute phase of infection. Positive results are indicative of  the presence of the identified virus, but do not rule out bacterial infection or co-infection with other pathogens not detected by the test. Clinical correlation with patient history and other diagnostic information is necessary to determine patient infection status. The expected result is Negative.  Fact Sheet for Patients: EntrepreneurPulse.com.au  Fact Sheet for Healthcare Providers: IncredibleEmployment.be  This test is not yet approved or cleared by the [place] FDA and  has been authorized for detection and/or diagnosis of SARS-CoV-2 by FDA under an Emergency Use Authorization (EUA).  This EUA will remain in effect (meaning this test can be used) for the duration of  the COVID-19 declaration under Section 564(b)(1) of the A ct, 21 U.S.C. section 360bbb-3(b)(1), unless the authorization is terminated or revoked sooner.     Influenza A by PCR NEGATIVE NEGATIVE Final   Influenza B  by PCR NEGATIVE NEGATIVE Final    Comment: (NOTE) The Xpert Xpress SARS-CoV-2/FLU/RSV plus assay is intended as an aid in the diagnosis of influenza from Nasopharyngeal swab specimens and should not be used as a sole basis for treatment. Nasal washings and aspirates are unacceptable for Xpert Xpress SARS-CoV-2/FLU/RSV testing.  Fact Sheet for Patients: EntrepreneurPulse.com.au  Fact Sheet for Healthcare Providers: IncredibleEmployment.be  This test is not yet approved or cleared by the [place] FDA and has been authorized for detection and/or diagnosis of SARS-CoV-2 by FDA under an Emergency Use Authorization (EUA). This EUA will remain in effect (meaning this test can be used) for the duration of the COVID-19 declaration under Section 564(b)(1) of the Act, 21 U.S.C. section 360bbb-3(b)(1), unless the authorization is terminated or revoked.  Performed at [facility], [address] [place], [place] [address]       Radiology Studies: DG CHEST PORT 1 VIEW  Result Date: 12/29/2021 CLINICAL DATA:  Shortness of breath EXAM: PORTABLE CHEST 1 VIEW COMPARISON:  12/27/2021 FINDINGS: Transverse diameter of heart is in upper limits of normal. Lung fields are clear of any infiltrates or pulmonary edema. There are few surgical clips overlying the left upper lung field, possibly fiduciary markers or residual change from previous surgery. Left lateral CP angle is not included in the radiograph. There is no significant pleural effusion or pneumothorax. IMPRESSION: There are no signs of pulmonary edema or new focal infiltrates. Electronically Signed   By: Elmer Picker M.D.   On: 12/29/2021 09:59   ECHOCARDIOGRAM COMPLETE  Result Date: 12/28/2021    ECHOCARDIOGRAM REPORT   Patient Name:   Lisa [name] Date of Exam: 12/28/2021 Medical Rec #:  [id number]          Height:       66.0 in Accession #:    [id number]         Weight:       112.9 lb Date of Birth:  [birth year]           BSA:          1.568 m Patient Age:    29 years           BP:           160/65 mmHg Patient Gender: F                  HR:           71 bpm. Exam Location:  [facility] Procedure: 2D Echo, Cardiac Doppler and Color Doppler Indications:    CHF  History:        Patient has prior history of Echocardiogram examinations, most                 recent 09/03/2021. Previous Myocardial Infarction, COPD,                 Signs/Symptoms:Chest Pain; Risk Factors:Hypertension and Current                 Smoker. Lung CA, CKD, Covid +.  Sonographer:    Wenda Low Referring Phys: [id number] Breana Litts  Sonographer Comments: Image acquisition challenging due to patient body habitus. IMPRESSIONS  1. Left ventricular ejection fraction, by estimation, is 65 to 70%. The left ventricle has normal function. The left ventricle has no regional wall motion abnormalities. Left ventricular diastolic parameters are indeterminate.  2. Right ventricular systolic function is  normal. The right ventricular size is normal. There is normal  pulmonary artery systolic pressure.  3. Left atrial size was mild to moderately dilated.  4. The mitral valve is normal in structure. Trivial mitral valve regurgitation. No evidence of mitral stenosis.  5. The tricuspid valve is abnormal.  6. The aortic valve has an indeterminant number of cusps. There is mild calcification of the aortic valve. There is mild thickening of the aortic valve. Aortic valve regurgitation is not visualized. No aortic stenosis is present. FINDINGS  Left Ventricle: Left ventricular ejection fraction, by estimation, is 65 to 70%. The left ventricle has normal function. The left ventricle has no regional wall motion abnormalities. The left ventricular internal cavity size was normal in size. There is  no left ventricular hypertrophy. Left ventricular diastolic parameters are indeterminate. Right Ventricle: The right ventricular size is normal. Right vetricular wall thickness was not well visualized. Right ventricular systolic function is normal. There is normal pulmonary artery systolic pressure. The tricuspid regurgitant velocity is 2.73 m/s, and with an assumed right atrial pressure of 3 mmHg, the estimated right ventricular systolic pressure is 31.5 mmHg. Left Atrium: Left atrial size was mild to moderately dilated. Right Atrium: Right atrial size was normal in size. Pericardium: There is no evidence of pericardial effusion. Mitral Valve: The mitral valve is normal in structure. There is mild thickening of the mitral valve leaflet(s). There is mild calcification of the mitral valve leaflet(s). Mild mitral annular calcification. Trivial mitral valve regurgitation. No evidence  of mitral valve stenosis. MV peak gradient, 7.2 mmHg. The mean mitral valve gradient is 2.0 mmHg. Tricuspid Valve: The tricuspid valve is abnormal. Tricuspid valve regurgitation is mild . No evidence of tricuspid stenosis. Aortic Valve: The aortic valve has  an indeterminant number of cusps. There is mild calcification of the aortic valve. There is mild thickening of the aortic valve. There is mild aortic valve annular calcification. Aortic valve regurgitation is not visualized. No aortic stenosis is present. Aortic valve mean gradient measures 6.0 mmHg. Aortic valve peak gradient measures 11.2 mmHg. Aortic valve area, by VTI measures 1.93 cm. Pulmonic Valve: The pulmonic valve was not well visualized. Pulmonic valve regurgitation is not visualized. No evidence of pulmonic stenosis. Aorta: The aortic root is normal in size and structure. IAS/Shunts: No atrial level shunt detected by color flow Doppler.  LEFT VENTRICLE PLAX 2D LVIDd:         4.50 cm   Diastology LVIDs:         2.50 cm   LV e' medial:    6.20 cm/s LV PW:         0.90 cm   LV E/e' medial:  18.4 LV IVS:        0.90 cm   LV e' lateral:   7.94 cm/s LVOT diam:     2.00 cm   LV E/e' lateral: 14.4 LV SV:         81 LV SV Index:   52 LVOT Area:     3.14 cm  RIGHT VENTRICLE RV Basal diam:  2.95 cm RV Mid diam:    2.20 cm RV S prime:     11.70 cm/s TAPSE (M-mode): 2.0 cm LEFT ATRIUM             Index        RIGHT ATRIUM           Index LA diam:        4.00 cm 2.55 cm/m   RA Area:     14.20 cm LA Vol (  A2C):   59.9 ml 38.20 ml/m  RA Volume:   34.40 ml  21.94 ml/m LA Vol (A4C):   73.3 ml 46.74 ml/m LA Biplane Vol: 68.6 ml 43.74 ml/m  AORTIC VALVE                     PULMONIC VALVE AV Area (Vmax):    1.99 cm      PV Vmax:       0.84 m/s AV Area (Vmean):   1.70 cm      PV Peak grad:  2.8 mmHg AV Area (VTI):     1.93 cm AV Vmax:           167.00 cm/s AV Vmean:          118.000 cm/s AV VTI:            0.421 m AV Peak Grad:      11.2 mmHg AV Mean Grad:      6.0 mmHg LVOT Vmax:         106.00 cm/s LVOT Vmean:        63.900 cm/s LVOT VTI:          0.258 m LVOT/AV VTI ratio: 0.61  AORTA Ao Root diam: 3.20 cm MITRAL VALVE                TRICUSPID VALVE MV Area (PHT): 3.53 cm     TR Peak grad:   29.8 mmHg MV Area  VTI:   2.23 cm     TR Vmax:        273.00 cm/s MV Peak grad:  7.2 mmHg MV Mean grad:  2.0 mmHg     SHUNTS MV Vmax:       1.34 m/s     Systemic VTI:  0.26 m MV Vmean:      67.4 cm/s    Systemic Diam: 2.00 cm MV Decel Time: 215 msec MV E velocity: 114.00 cm/s MV A velocity: 99.80 cm/s MV E/A ratio:  1.14 Carlyle Dolly MD Electronically signed by Carlyle Dolly MD Signature Date/Time: 12/28/2021/3:05:03 PM    Final    DG Chest 2 View  Result Date: 12/27/2021 CLINICAL DATA:  Shortness of breath. EXAM: CHEST - 2 VIEW COMPARISON:  CT chest dated December 05, 2021. Chest x-ray dated September 04, 2021. FINDINGS: The heart size and mediastinal contours are within normal limits. Normal pulmonary vascularity. Fiducial markers again noted in the left upper lobe with resolved patchy opacity seen on the prior chest x-ray, likely resolved post biopsy hemorrhage. Small left apical nodule seen on recent CT is not visualized by x-ray. No focal consolidation, pleural effusion, or pneumothorax. No acute osseous abnormality. IMPRESSION: 1. No acute cardiopulmonary disease. Electronically Signed   By: Titus Dubin M.D.   On: 12/27/2021 21:01    Scheduled Meds:  albuterol  2 puff Inhalation BID   amLODipine  10 mg Oral Daily   vitamin C  500 mg Oral Daily   aspirin EC  81 mg Oral q AM   carvedilol  25 mg Oral BID WC   dextromethorphan-guaiFENesin  1 tablet Oral BID   enoxaparin (LOVENOX) injection  30 mg Subcutaneous Q24H   feeding supplement  237 mL Oral BID BM   ferrous sulfate  325 mg Oral Q breakfast   furosemide  20 mg Oral BID   insulin aspart  0-9 Units Subcutaneous TID WC   isosorbide mononitrate  30 mg Oral Daily   methylPREDNISolone (SOLU-MEDROL) injection  0.5 mg/kg Intravenous Q12H   Followed by   Derrill Memo ON 12/31/2021] predniSONE  50 mg Oral Daily   molnupiravir EUA  4 capsule Oral BID   nicotine  21 mg Transdermal Daily   pantoprazole (PROTONIX) IV  40 mg Intravenous QHS   zinc sulfate  220 mg Oral  Daily   Continuous Infusions:   LOS: 0 days   Shelly Coss, MD [facility] P7/20/2023, 11:45 AM

## 2021-12-29 NOTE — Plan of Care (Signed)
  Problem: Acute Rehab PT Goals(only PT should resolve) Goal: Pt Will Go Supine/Side To Sit Outcome: Progressing Flowsheets (Taken 12/29/2021 1211) Pt will go Supine/Side to Sit:  with modified independence  with supervision Goal: Patient Will Transfer Sit To/From Stand Outcome: Progressing Flowsheets (Taken 12/29/2021 1211) Patient will transfer sit to/from stand:  with modified independence  with supervision Goal: Pt Will Transfer Bed To Chair/Chair To Bed Outcome: Progressing Flowsheets (Taken 12/29/2021 1211) Pt will Transfer Bed to Chair/Chair to Bed:  with modified independence  with supervision Goal: Pt Will Ambulate Outcome: Progressing Flowsheets (Taken 12/29/2021 1211) Pt will Ambulate:  50 feet  with supervision  with rolling walker   12:12 PM, 12/29/21 Lonell Grandchild, MPT Physical Therapist with [facility] 336 [phone] office [phone] mobile phone

## 2021-12-29 NOTE — Care Management Obs Status (Signed)
[place] NOTIFICATION   Patient Details  Name: Lisa [name] MRN: [id number] Date of Birth: 09/18/1934   Medicare Observation Status Notification Given:  Yes    Tommy Medal 12/29/2021, 9:10 AM

## 2021-12-29 NOTE — TOC Initial Note (Signed)
Transition of Care [facility]) - Initial/Assessment Note    Patient Details  Name: Lisa [name] MRN: [id number] Date of Birth: [birth year]  [facility] CM/SW Contact:    Iona Beard, [place] Phone Number: 12/29/2021, 3:21 PM  Clinical Narrative:                 Pt is high risk for readmission. CSW spoke with pt to complete assessment. Pt states that she lives alone however she has family checking on her daily. Pt is independent in completing her ADLs. Pt states that she had [place] set up at last hospital admission. Pt states that she is interested in this being set up again. CSW reached out to [facility] to see if they can accept pt back. Pt has a walker, wheelchair and cane to use in the home when needed. TOC to follow for needs.   Expected Discharge Plan: [place] Barriers to Discharge: Continued Medical Work up   Patient Goals and CMS Choice Patient states their goals for this hospitalization and ongoing recovery are:: return home CMS Medicare.gov Compare Post Acute Care list provided to:: Patient Choice offered to / list presented to : Patient  Expected Discharge Plan and Services Expected Discharge Plan: [place] In-house Referral: Clinical Social Work Discharge Planning Services: CM Consult Post Acute Care Choice: [place] arrangements for the past 2 months: Single Family Home                                      Prior Living Arrangements/Services Living arrangements for the past 2 months: Single Family Home Lives with:: Self Patient language and need for interpreter reviewed:: Yes Do you feel safe going back to the place where you live?: Yes      Need for Family Participation in Patient Care: Yes (Comment) Care giver support system in place?: Yes (comment) Current home services: DME Criminal Activity/Legal Involvement Pertinent to Current Situation/Hospitalization: No - Comment as needed  Activities of  Daily Living Home Assistive Devices/Equipment: None ADL Screening (condition at time of admission) Patient's cognitive ability adequate to safely complete daily activities?: Yes Is the patient deaf or have difficulty hearing?: No Does the patient have difficulty seeing, even when wearing glasses/contacts?: No Does the patient have difficulty concentrating, remembering, or making decisions?: No Patient able to express need for assistance with ADLs?: Yes Does the patient have difficulty dressing or bathing?: No Independently performs ADLs?: Yes (appropriate for developmental age) Does the patient have difficulty walking or climbing stairs?: No Weakness of Legs: None Weakness of Arms/Hands: None  Permission Sought/Granted                  Emotional Assessment Appearance:: Appears stated age Attitude/Demeanor/Rapport: Engaged Affect (typically observed): Accepting Orientation: : Oriented to Self, Oriented to Place, Oriented to  Time, Oriented to Situation Alcohol / Substance Use: Not Applicable Psych Involvement: No (comment)  Admission diagnosis:  Hypoxia [R09.02] COPD exacerbation ([place]) [J44.1] COPD with acute exacerbation ([place]) [J44.1] COVID-19 [U07.1] Patient Active Problem List   Diagnosis Date Noted   COVID-19 12/29/2021   Acute respiratory failure with hypoxia ([place]) 12/28/2021   COVID-19 virus infection 12/28/2021   Elevated brain natriuretic peptide (BNP) level 12/28/2021   Elevated troponin 12/28/2021   Chronic kidney disease, stage 3b ([place]) 12/28/2021   Failure to thrive in adult 12/28/2021   Underweight  12/28/2021   Pain and swelling of forearm, left 12/28/2021   COPD with acute exacerbation ([place]) 12/27/2021   Malignant neoplasm of upper lobe of left lung ([place]) 09/15/2021   Adenocarcinoma of left lung ([place]) 09/11/2021   Enlarged thyroid gland 09/05/2021   Odynophagia 09/03/2021   Mass of upper lobe of left lung 09/03/2021   Hypertensive urgency 09/02/2021    Chest pain 09/02/2021   CKD (chronic kidney disease) stage 4, GFR 15-29 ml/min (HCC) 09/02/2021   Tobacco abuse 09/02/2021   Myeloma ([place]) 09/02/2021   Non-ST elevation (NSTEMI) myocardial infarction [facility])    Demand ischemia ([place])    Iron deficiency anemia 07/29/2021   Anemia due to stage 4 chronic kidney disease ([place]) 07/22/2021   Hepatitis C virus infection without hepatic coma 07/22/2021   Smoldering multiple myeloma 06/21/2021   Abnormal LFTs (liver function tests)    Calculus of gallbladder without cholecystitis without obstruction    Gallstone pancreatitis 09/13/2018   Protein-calorie malnutrition, severe ([place]) 05/09/2014   Sepsis ([place]) 05/07/2014   Encephalopathy 05/07/2014   Back pain 05/07/2014   Acute renal failure syndrome (HCC)    Bacteremia    COLD (chronic obstructive lung disease) ([place])    Viridans streptococci infection    H/O goiter 04/10/2014   Moderate protein-calorie malnutrition ([place]) 04/10/2014   Acute encephalopathy 04/09/2014   Streptococcus viridans infection 04/08/2014   Leukocytosis 04/07/2014   Dehydration 04/07/2014   Fever 04/06/2014   UTI (lower urinary tract infection) 04/06/2014   ARF (acute renal failure) ([place]) 04/06/2014   Pyelonephritis, acute 04/06/2014   Hypertension    COPD (chronic obstructive pulmonary disease) ([place])    PCP:  Carrolyn Meiers, MD Pharmacy:   [facility], [place] [address] Phone: [phone] Fax: [phone]     Social Determinants of Health (SDOH) Interventions    Readmission Risk Interventions    09/06/2021    9:23 AM  Readmission Risk Prevention Plan  Transportation Screening Complete  PCP or Specialist Appt within 3-5 Days Complete  HRI or [place] Complete  Social Work Consult for [place] Planning/Counseling Complete  Palliative Care Screening Not Applicable  Medication Review Press photographer) Complete

## 2021-12-29 NOTE — Evaluation (Addendum)
Physical Therapy Evaluation Patient Details Name: Lisa [name] MRN: [id number] DOB: 08/17/1934 Today's Date: 12/29/2021  History of Present Illness  Lisa [name] is a 87 y.o. female with medical history significant of COPD, hypertension, chronic hepatitis C, tobacco abuse, CKD stage IIIb, malignant neoplasm of left lung, smoldering myeloma who presents to the emergency department from home due to shortness of breath which started today.  She states that she has been having increased breathing problems since radiotherapy due to lung Cancer.  Patient states that she tried home inhaler without any improvement, so she activated EMS,, so she activated EMS, on arrival of EMS team, she was noted to have an O2 sat of 88% on room air (patient does not use supplemental oxygen at baseline).  Supplemental oxygen was provided with improved oxygenation.  She denies fever, chills, nausea, vomiting, abdominal pain, diarrhea.   Clinical Impression  Patient functioning near baseline for functional mobility and gait other than limited mostly due to c/o SOB with difficulty breathing.  Patient put on 2 LPM and able to ambulate in room without loss of balance, but limited mostly due to fatigue/SOB and tolerated sitting up in chair after therapy - nurse notified.  Patient will benefit from continued skilled physical therapy in hospital and recommended venue below to increase strength, balance, endurance for safe ADLs and gait.         Recommendations for follow up therapy are one component of a multi-disciplinary discharge planning process, led by the attending physician.  Recommendations may be updated based on patient status, additional functional criteria and insurance authorization.  Follow Up Recommendations Home health PT      Assistance Recommended at Discharge Set up Supervision/Assistance  Patient can return home with the following  A little help with walking and/or transfers;A little help with  bathing/dressing/bathroom;Help with stairs or ramp for entrance;Assistance with cooking/housework    Equipment Recommendations None recommended by PT  Recommendations for Other Services       Functional Status Assessment Patient has had a recent decline in their functional status and demonstrates the ability to make significant improvements in function in a reasonable and predictable amount of time.     Precautions / Restrictions Precautions Precautions: Fall Restrictions Weight Bearing Restrictions: No      Mobility  Bed Mobility Overal bed mobility: Needs Assistance Bed Mobility: Supine to Sit     Supine to sit: Supervision     General bed mobility comments: increased time, labored movement    Transfers Overall transfer level: Needs assistance Equipment used: Rolling walker (2 wheels) Transfers: Sit to/from Stand, Bed to chair/wheelchair/BSC Sit to Stand: Min guard   Step pivot transfers: Min guard       General transfer comment: slow labored movement    Ambulation/Gait Ambulation/Gait assistance: Min guard, Min assist Gait Distance (Feet): 20 Feet Assistive device: Rolling walker (2 wheels) Gait Pattern/deviations: Decreased step length - right, Decreased step length - left, Decreased stride length, Decreased dorsiflexion - right, Decreased dorsiflexion - left Gait velocity: decreased     General Gait Details: slow labored cadence having to hike hips due to decreased BLE dorsiflexion, no loss of balance and limited mostly due to fatigue and SOB while on 2 LPM  Stairs            Wheelchair Mobility    Modified Rankin (Stroke Patients Only)       Balance Overall balance assessment: Needs assistance Sitting-balance support: Feet supported, No upper extremity supported Sitting  balance-Leahy Scale: Good Sitting balance - Comments: seated at EOB   Standing balance support: During functional activity, Bilateral upper extremity supported Standing  balance-Leahy Scale: Fair Standing balance comment: fair/good using RW                             Pertinent Vitals/Pain Pain Assessment Pain Assessment: No/denies pain    Home Living Family/patient expects to be discharged to:: Private residence Living Arrangements: Other relatives Available Help at Discharge: Family;Available PRN/intermittently Type of Home: House Home Access: Stairs to enter Entrance Stairs-Rails: Can reach both;Right;Left Entrance Stairs-Number of Steps: 2 Alternate Level Stairs-Number of Steps: can stay on main level, has ramp to 2nd level Home Layout: Two level;Able to live on main level with bedroom/bathroom Home Equipment: Rolling Walker (2 wheels);Rollator (4 wheels);BSC/3in1;Shower seat      Prior Function Prior Level of Function : Needs assist       Physical Assist : Mobility (physical);ADLs (physical) Mobility (physical): Bed mobility;Transfers;Gait;Stairs   Mobility Comments: household ambulator using RW, uses electric when in stores ADLs Comments: assisted by family     Hand Dominance   Dominant Hand: Right    Extremity/Trunk Assessment   Upper Extremity Assessment Upper Extremity Assessment: Overall WFL for tasks assessed    Lower Extremity Assessment Lower Extremity Assessment: Generalized weakness    Cervical / Trunk Assessment Cervical / Trunk Assessment: Kyphotic  Communication   Communication: No difficulties  Cognition Arousal/Alertness: Awake/alert Behavior During Therapy: WFL for tasks assessed/performed Overall Cognitive Status: Within Functional Limits for tasks assessed                                          General Comments      Exercises     Assessment/Plan    PT Assessment Patient needs continued PT services  PT Problem List Decreased strength;Decreased activity tolerance;Decreased balance;Decreased mobility       PT Treatment Interventions DME instruction;Gait  training;Stair training;Functional mobility training;Therapeutic activities;Therapeutic exercise;Patient/family education;Balance training    PT Goals (Current goals can be found in the Care Plan section)  Acute Rehab PT Goals Patient Stated Goal: return home with family to assist PT Goal Formulation: With patient Time For Goal Achievement: 01/05/22 Potential to Achieve Goals: Good    Frequency Min 3X/week     Co-evaluation               AM-PAC PT "6 Clicks" Mobility  Outcome Measure Help needed turning from your back to your side while in a flat bed without using bedrails?: None Help needed moving from lying on your back to sitting on the side of a flat bed without using bedrails?: A Little Help needed moving to and from a bed to a chair (including a wheelchair)?: A Little Help needed standing up from a chair using your arms (e.g., wheelchair or bedside chair)?: A Little Help needed to walk in hospital room?: A Little Help needed climbing 3-5 steps with a railing? : A Lot 6 Click Score: 18    End of Session Equipment Utilized During Treatment: Oxygen Activity Tolerance: Patient tolerated treatment well;Patient limited by fatigue Patient left: in chair;with call bell/phone within reach;with chair alarm set Nurse Communication: Mobility status PT Visit Diagnosis: Unsteadiness on feet (R26.81);Other abnormalities of gait and mobility (R26.89);Muscle weakness (generalized) (M62.81)    Time: 5956-3875 PT Time Calculation (  min) (ACUTE ONLY): 28 min   Charges:   PT Evaluation $PT Eval Moderate Complexity: 1 Mod PT Treatments $Therapeutic Activity: 23-37 mins        3:12 PM, 12/29/21 Lonell Grandchild, MPT Physical Therapist with [facility] [phone] office [phone] mobile phone

## 2021-12-30 DIAGNOSIS — J441 Chronic obstructive pulmonary disease with (acute) exacerbation: Secondary | ICD-10-CM | POA: Diagnosis not present

## 2021-12-30 LAB — CBC WITH DIFFERENTIAL/PLATELET
Abs Immature Granulocytes: 0.04 10*3/uL (ref 0.00–0.07)
Basophils Absolute: 0 10*3/uL (ref 0.0–0.1)
Basophils Relative: 0 %
Eosinophils Absolute: 0 10*3/uL (ref 0.0–0.5)
Eosinophils Relative: 0 %
HCT: 27.5 % — ABNORMAL LOW (ref 36.0–46.0)
Hemoglobin: 8.7 g/dL — ABNORMAL LOW (ref 12.0–15.0)
Immature Granulocytes: 1 %
Lymphocytes Relative: 6 %
Lymphs Abs: 0.4 10*3/uL — ABNORMAL LOW (ref 0.7–4.0)
MCH: 29.2 pg (ref 26.0–34.0)
MCHC: 31.6 g/dL (ref 30.0–36.0)
MCV: 92.3 fL (ref 80.0–100.0)
Monocytes Absolute: 0.2 10*3/uL (ref 0.1–1.0)
Monocytes Relative: 4 %
Neutro Abs: 5.4 10*3/uL (ref 1.7–7.7)
Neutrophils Relative %: 89 %
Platelets: 100 10*3/uL — ABNORMAL LOW (ref 150–400)
RBC: 2.98 MIL/uL — ABNORMAL LOW (ref 3.87–5.11)
RDW: 14.3 % (ref 11.5–15.5)
WBC: 6 10*3/uL (ref 4.0–10.5)
nRBC: 0 % (ref 0.0–0.2)

## 2021-12-30 LAB — COMPREHENSIVE METABOLIC PANEL
ALT: 18 U/L (ref 0–44)
AST: 22 U/L (ref 15–41)
Albumin: 2.7 g/dL — ABNORMAL LOW (ref 3.5–5.0)
Alkaline Phosphatase: 35 U/L — ABNORMAL LOW (ref 38–126)
Anion gap: 7 (ref 5–15)
BUN: 70 mg/dL — ABNORMAL HIGH (ref 8–23)
CO2: 27 mmol/L (ref 22–32)
Calcium: 8.2 mg/dL — ABNORMAL LOW (ref 8.9–10.3)
Chloride: 100 mmol/L (ref 98–111)
Creatinine, Ser: 1.49 mg/dL — ABNORMAL HIGH (ref 0.44–1.00)
GFR, Estimated: 34 mL/min — ABNORMAL LOW (ref >60)
Glucose, Bld: 126 mg/dL — ABNORMAL HIGH (ref 70–99)
Potassium: 4.3 mmol/L (ref 3.5–5.1)
Sodium: 134 mmol/L — ABNORMAL LOW (ref 135–145)
Total Bilirubin: 0.8 mg/dL (ref 0.3–1.2)
Total Protein: 7.6 g/dL (ref 6.5–8.1)

## 2021-12-30 LAB — GLUCOSE, CAPILLARY
Glucose-Capillary: 122 mg/dL — ABNORMAL HIGH (ref 70–99)
Glucose-Capillary: 130 mg/dL — ABNORMAL HIGH (ref 70–99)
Glucose-Capillary: 115 mg/dL — ABNORMAL HIGH (ref 70–99)
Glucose-Capillary: 132 mg/dL — ABNORMAL HIGH (ref 70–99)

## 2021-12-30 MED ORDER — ALBUTEROL SULFATE HFA 108 (90 BASE) MCG/ACT IN AERS
INHALATION_SPRAY | RESPIRATORY_TRACT | Status: AC
  Filled 2021-12-30: qty 6.7

## 2021-12-30 MED ORDER — ALBUTEROL SULFATE (2.5 MG/3ML) 0.083% IN NEBU
INHALATION_SOLUTION | RESPIRATORY_TRACT | Status: AC
  Administered 2021-12-30: 2.5 mg
  Filled 2021-12-30: qty 3

## 2021-12-30 MED ORDER — IPRATROPIUM-ALBUTEROL 0.5-2.5 (3) MG/3ML IN SOLN
RESPIRATORY_TRACT | Status: AC
  Administered 2021-12-30: 3 mL
  Filled 2021-12-30: qty 3

## 2021-12-30 NOTE — TOC Progression Note (Addendum)
Transition of Care [facility]) - Progression Note    Patient Details  Name: Lisa [name] MRN: [id number] Date of Birth: 06-Jun-1935  Transition of Care [facility]) CM/SW Contact  Salome Arnt, Between Phone Number: 12/30/2021, 11:32 AM  Clinical Narrative:  Tommi Rumps with [facility] can accept for HHPT. Pt notified. MD aware of need for home health order. TOC will continue to follow.      Expected Discharge Plan: [place] Barriers to Discharge: Continued Medical Work up  Expected Discharge Plan and Services Expected Discharge Plan: [place] In-house Referral: Clinical Social Work Discharge Planning Services: CM Consult Post Acute Care Choice: [place] arrangements for the past 2 months: [place]: PT [place]: [place] Date [place]: 12/30/21 Time [place]: [id number] Representative spoke with at [place]: [place] ([place]) Interventions    Readmission Risk Interventions    09/06/2021    9:23 AM  Readmission Risk Prevention Plan  Transportation Screening Complete  PCP or Specialist Appt within 3-5 Days Complete  HRI or [place] Complete  Social Work Consult for [place] Planning/Counseling Complete  Palliative Care Screening Not Applicable  Medication Review Press photographer) Complete

## 2021-12-30 NOTE — Progress Notes (Signed)
PT Cancellation Note  Patient Details Name: Lisa [name] MRN: [id number] DOB: 02-Jan-1935   Cancelled Treatment:    Reason Eval/Treat Not Completed: Patient declined, no reason specified.  Patient declined therapy secondary to fatigue after sitting up in chair for most of day - nurse notified that patient has grandson that lives with her, but works during the day and her son comes over to help during the day, "per patient."   3:15 PM, 12/30/21 Lonell Grandchild, MPT Physical Therapist with [facility] [phone] office [phone] mobile phone

## 2021-12-30 NOTE — Progress Notes (Signed)
PROGRESS NOTE  Lisa [name]  UYQ:[id number] DOB: 30-May-1935 DOA: 12/27/2021 PCP: Carrolyn Meiers, MD   Brief Narrative:  Patient is a 87 year old female with history of COPD, hypertension, chronic hepatitis C, tobacco use, CKD stage IIIb, mild neoplasm of left lung, smoldering myeloma who presents from home with complaint of shortness of breath.  EMS found her to be hypoxic on room air.  On presentation she was hypertensive.  Lab work showed elevated BNP.  COVID screen test was positive.  Chest x-ray did not show any pneumonia.  Patient was started on steroids, molnupiravir for COVID.  Patient slowly improving but does not feel ready to go home yet.  PT recommended home health on discharge.  May be discharge in the weekend   Assessment & Plan:  Principal Problem:   COPD with acute exacerbation ([place]) Active Problems:   Smoldering multiple myeloma   Acute respiratory failure with hypoxia (HCC)   COVID-19 virus infection   Elevated brain natriuretic peptide (BNP) level   Elevated troponin   Chronic kidney disease, stage 3b (HCC)   Failure to thrive in adult   Underweight   Pain and swelling of forearm, left   COVID-19   Acute respiratory failure with hypoxia: Secondary to COVID infection/COPD exacerbation.  Continue bronchodilators.  Started on steroid, molnupiravir, vitamin C, zinc.  Continue cough medications.  She is on 2 L of oxygen at present, we will try to wean.  She is not on home oxygen Follow-up chest x-ray done on 7/20 did not show any edema or focal infiltrates.  She feels better today but does not feel ready to go home yet. Continue inhaler,flutter valve  COPD exacerbation: Secondary to COVID illness.  Continue steroids, bronchodilators.  She is not on oxygen at home.  No wheezing is heard today  Diastolic congestive heart failure/Elevated troponin/elevated BNP:  Elevated BNP.  Takes Lasix at home,will continue Denies chest pain.  Elevated troponin  with  positive delta.  Likely demand ischemia.  Echo showed EF of 65 to 70%, no wall motion abnormality.  CKD stage IIIb: Baseline creatinine around 1.3-1.9.  Currently kidney function at baseline.  History of smoldering myeloma/adenocarcinoma of left lung: Currently in remission.  Follows with Dr. Delton Coombes.  Will recommend outpatient follow-up  Hypertension: Patient on amlodipine, carvedilol, Lasix, Imdur at home.  Restarted   Tobacco use: Counseled cessation.  Continue nicotine patch  Normocytic anemia/thrombocytopenia: Patient has pancytopenia picture.  This is most likely secondary to history of malignancy: Smoldering myeloma/adenocarcinoma of the left lung which are currently in remission.   No evidence of acute blood loss.  Iron studies showed low iron, given a dose of IV iron.  FOBT ordered but not collected.  No change in the color of the stool reported.  Generalized weakness: PT recommend home health on discharge        DVT prophylaxis:SCDs Start: 12/28/21 0258 SCDs Start: 12/27/21 2332     Code Status: Full Code  Family Communication: Called and discussed with niece on phone on 7/21  Patient status:Inpatient  Patient is from :Home  Anticipated discharge GL:OVFI  Estimated DC date:1-2 days   Consultants: None  Procedures:None  Antimicrobials:  Anti-infectives (From admission, onward)    Start     Dose/Rate Route Frequency Ordered Stop   12/28/21 1000  nirmatrelvir/ritonavir EUA (renal dosing) (PAXLOVID) 2 tablet  Status:  Discontinued        2 tablet Oral 2 times daily 12/28/21 0309 12/28/21 0751   12/28/21 1000  molnupiravir EUA (LAGEVRIO) capsule 800 mg        4 capsule Oral 2 times daily 12/28/21 0751 01/02/22 0959   12/28/21 0315  nirmatrelvir/ritonavir EUA (PAXLOVID) 3 tablet  Status:  Discontinued        3 tablet Oral 2 times daily 12/28/21 0303 12/28/21 0309       Subjective:  Patient seen and examined at the bedside this morning.  Slept well.   Coughing is better.  She is still on oxygen which we are trying to wean.  She feels better today but does not feel ready to go home yet.  She states she lives alone  Objective: Vitals:   12/29/21 2003 12/30/21 0319 12/30/21 0522 12/30/21 0749  BP:  (!) 157/60    Pulse:  (!) 55    Resp:  20    Temp:  97.7 F (36.5 C)    TempSrc:  Oral    SpO2: 98% 100%  100%  Weight:   50.7 kg     Intake/Output Summary (Last 24 hours) at 12/30/2021 1156 Last data filed at 12/30/2021 0900 Gross per 24 hour  Intake 840 ml  Output 800 ml  Net 40 ml   Filed Weights   12/28/21 0611 12/29/21 0604 12/30/21 0522  Weight: 51.2 kg 49.3 kg 50.7 kg    Examination:   General exam: Overall comfortable, not in distress, pleasant elderly female HEENT: PERRL Respiratory system: Bilateral scattered rhonchi, no wheezes or crackles  Cardiovascular system: S1 & S2 heard, RRR.  Gastrointestinal system: Abdomen is nondistended, soft and nontender. Central nervous system: Alert and oriented Extremities: Chronic edema of the left upper extremity, no clubbing ,no cyanosis Skin: No rashes, no ulcers,no icterus     Data Reviewed: I have personally reviewed following labs and imaging studies  CBC: Recent Labs  Lab 12/27/21 2026 12/28/21 0400 12/29/21 0525 12/30/21 0516  WBC 5.7 4.4 6.5 6.0  NEUTROABS  --   --  5.7 5.4  HGB 9.0* 8.5* 8.6* 8.7*  HCT 27.8* 27.1* 26.4* 27.5*  MCV 92.4 93.8 91.7 92.3  PLT 116* 102* 107* 876*   Basic Metabolic Panel: Recent Labs  Lab 12/27/21 2026 12/28/21 0400 12/29/21 0525 12/30/21 0516  NA 137 137 134* 134*  K 4.3 4.0 4.2 4.3  CL 104 103 100 100  CO2 '27 27 26 27  ' GLUCOSE 102* 123* 131* 126*  BUN 39* 39* 56* 70*  CREATININE 1.54* 1.52* 1.44* 1.49*  CALCIUM 8.5* 8.5* 8.5* 8.2*  MG  --  2.1 2.3  --   PHOS  --  4.0 4.8*  --      Recent Results (from the past 240 hour(s))  Resp Panel by RT-PCR (Flu A&B, Covid) Anterior Nasal Swab     Status: Abnormal    Collection Time: 12/27/21  9:08 PM   Specimen: Anterior Nasal Swab  Result Value Ref Range Status   SARS Coronavirus 2 by RT PCR POSITIVE (A) NEGATIVE Final    Comment: (NOTE) SARS-CoV-2 target nucleic acids are DETECTED.  The SARS-CoV-2 RNA is generally detectable in upper respiratory specimens during the acute phase of infection. Positive results are indicative of the presence of the identified virus, but do not rule out bacterial infection or co-infection with other pathogens not detected by the test. Clinical correlation with patient history and other diagnostic information is necessary to determine patient infection status. The expected result is Negative.  Fact Sheet for Patients: EntrepreneurPulse.com.au  Fact Sheet for Healthcare Providers: IncredibleEmployment.be  This  test is not yet approved or cleared by the [place] and  has been authorized for detection and/or diagnosis of SARS-CoV-2 by FDA under an Emergency Use Authorization (EUA).  This EUA will remain in effect (meaning this test can be used) for the duration of  the COVID-19 declaration under Section 564(b)(1) of the A ct, 21 U.S.C. section 360bbb-3(b)(1), unless the authorization is terminated or revoked sooner.     Influenza A by PCR NEGATIVE NEGATIVE Final   Influenza B by PCR NEGATIVE NEGATIVE Final    Comment: (NOTE) The Xpert Xpress SARS-CoV-2/FLU/RSV plus assay is intended as an aid in the diagnosis of influenza from Nasopharyngeal swab specimens and should not be used as a sole basis for treatment. Nasal washings and aspirates are unacceptable for Xpert Xpress SARS-CoV-2/FLU/RSV testing.  Fact Sheet for Patients: EntrepreneurPulse.com.au  Fact Sheet for Healthcare Providers: IncredibleEmployment.be  This test is not yet approved or cleared by the [place] FDA and has been authorized for detection and/or  diagnosis of SARS-CoV-2 by FDA under an Emergency Use Authorization (EUA). This EUA will remain in effect (meaning this test can be used) for the duration of the COVID-19 declaration under Section 564(b)(1) of the Act, 21 U.S.C. section 360bbb-3(b)(1), unless the authorization is terminated or revoked.  Performed at [facility], [address] [place], [place] [address]      Radiology Studies: DG CHEST PORT 1 VIEW  Result Date: 12/29/2021 CLINICAL DATA:  Shortness of breath EXAM: PORTABLE CHEST 1 VIEW COMPARISON:  12/27/2021 FINDINGS: Transverse diameter of heart is in upper limits of normal. Lung fields are clear of any infiltrates or pulmonary edema. There are few surgical clips overlying the left upper lung field, possibly fiduciary markers or residual change from previous surgery. Left lateral CP angle is not included in the radiograph. There is no significant pleural effusion or pneumothorax. IMPRESSION: There are no signs of pulmonary edema or new focal infiltrates. Electronically Signed   By: Elmer Picker M.D.   On: 12/29/2021 09:59   ECHOCARDIOGRAM COMPLETE  Result Date: 12/28/2021    ECHOCARDIOGRAM REPORT   Patient Name:   MARTI MCLANE DAVIS Date of Exam: 12/28/2021 Medical Rec #:  [id number]          Height:       66.0 in Accession #:    [id number]         Weight:       112.9 lb Date of Birth:  31-May-1935           BSA:          1.568 m Patient Age:    23 years           BP:           160/65 mmHg Patient Gender: F                  HR:           71 bpm. Exam Location:  [facility] Procedure: 2D Echo, Cardiac Doppler and Color Doppler Indications:    CHF  History:        Patient has prior history of Echocardiogram examinations, most                 recent 09/03/2021. Previous Myocardial Infarction, COPD,                 Signs/Symptoms:Chest Pain; Risk Factors:Hypertension and Current  Smoker. Lung CA, CKD, Covid +.  Sonographer:    Wenda Low Referring Phys: [id number]  Tiffine Henigan  Sonographer Comments: Image acquisition challenging due to patient body habitus. IMPRESSIONS  1. Left ventricular ejection fraction, by estimation, is 65 to 70%. The left ventricle has normal function. The left ventricle has no regional wall motion abnormalities. Left ventricular diastolic parameters are indeterminate.  2. Right ventricular systolic function is normal. The right ventricular size is normal. There is normal pulmonary artery systolic pressure.  3. Left atrial size was mild to moderately dilated.  4. The mitral valve is normal in structure. Trivial mitral valve regurgitation. No evidence of mitral stenosis.  5. The tricuspid valve is abnormal.  6. The aortic valve has an indeterminant number of cusps. There is mild calcification of the aortic valve. There is mild thickening of the aortic valve. Aortic valve regurgitation is not visualized. No aortic stenosis is present. FINDINGS  Left Ventricle: Left ventricular ejection fraction, by estimation, is 65 to 70%. The left ventricle has normal function. The left ventricle has no regional wall motion abnormalities. The left ventricular internal cavity size was normal in size. There is  no left ventricular hypertrophy. Left ventricular diastolic parameters are indeterminate. Right Ventricle: The right ventricular size is normal. Right vetricular wall thickness was not well visualized. Right ventricular systolic function is normal. There is normal pulmonary artery systolic pressure. The tricuspid regurgitant velocity is 2.73 m/s, and with an assumed right atrial pressure of 3 mmHg, the estimated right ventricular systolic pressure is 10.2 mmHg. Left Atrium: Left atrial size was mild to moderately dilated. Right Atrium: Right atrial size was normal in size. Pericardium: There is no evidence of pericardial effusion. Mitral Valve: The mitral valve is normal in structure. There is mild thickening of the mitral valve leaflet(s). There is mild  calcification of the mitral valve leaflet(s). Mild mitral annular calcification. Trivial mitral valve regurgitation. No evidence  of mitral valve stenosis. MV peak gradient, 7.2 mmHg. The mean mitral valve gradient is 2.0 mmHg. Tricuspid Valve: The tricuspid valve is abnormal. Tricuspid valve regurgitation is mild . No evidence of tricuspid stenosis. Aortic Valve: The aortic valve has an indeterminant number of cusps. There is mild calcification of the aortic valve. There is mild thickening of the aortic valve. There is mild aortic valve annular calcification. Aortic valve regurgitation is not visualized. No aortic stenosis is present. Aortic valve mean gradient measures 6.0 mmHg. Aortic valve peak gradient measures 11.2 mmHg. Aortic valve area, by VTI measures 1.93 cm. Pulmonic Valve: The pulmonic valve was not well visualized. Pulmonic valve regurgitation is not visualized. No evidence of pulmonic stenosis. Aorta: The aortic root is normal in size and structure. IAS/Shunts: No atrial level shunt detected by color flow Doppler.  LEFT VENTRICLE PLAX 2D LVIDd:         4.50 cm   Diastology LVIDs:         2.50 cm   LV e' medial:    6.20 cm/s LV PW:         0.90 cm   LV E/e' medial:  18.4 LV IVS:        0.90 cm   LV e' lateral:   7.94 cm/s LVOT diam:     2.00 cm   LV E/e' lateral: 14.4 LV SV:         81 LV SV Index:   52 LVOT Area:     3.14 cm  RIGHT VENTRICLE RV Basal diam:  2.95 cm  RV Mid diam:    2.20 cm RV S prime:     11.70 cm/s TAPSE (M-mode): 2.0 cm LEFT ATRIUM             Index        RIGHT ATRIUM           Index LA diam:        4.00 cm 2.55 cm/m   RA Area:     14.20 cm LA Vol (A2C):   59.9 ml 38.20 ml/m  RA Volume:   34.40 ml  21.94 ml/m LA Vol (A4C):   73.3 ml 46.74 ml/m LA Biplane Vol: 68.6 ml 43.74 ml/m  AORTIC VALVE                     PULMONIC VALVE AV Area (Vmax):    1.99 cm      PV Vmax:       0.84 m/s AV Area (Vmean):   1.70 cm      PV Peak grad:  2.8 mmHg AV Area (VTI):     1.93 cm AV Vmax:            167.00 cm/s AV Vmean:          118.000 cm/s AV VTI:            0.421 m AV Peak Grad:      11.2 mmHg AV Mean Grad:      6.0 mmHg LVOT Vmax:         106.00 cm/s LVOT Vmean:        63.900 cm/s LVOT VTI:          0.258 m LVOT/AV VTI ratio: 0.61  AORTA Ao Root diam: 3.20 cm MITRAL VALVE                TRICUSPID VALVE MV Area (PHT): 3.53 cm     TR Peak grad:   29.8 mmHg MV Area VTI:   2.23 cm     TR Vmax:        273.00 cm/s MV Peak grad:  7.2 mmHg MV Mean grad:  2.0 mmHg     SHUNTS MV Vmax:       1.34 m/s     Systemic VTI:  0.26 m MV Vmean:      67.4 cm/s    Systemic Diam: 2.00 cm MV Decel Time: 215 msec MV E velocity: 114.00 cm/s MV A velocity: 99.80 cm/s MV E/A ratio:  1.14 Carlyle Dolly MD Electronically signed by Carlyle Dolly MD Signature Date/Time: 12/28/2021/3:05:03 PM    Final     Scheduled Meds:  albuterol  2 puff Inhalation BID   amLODipine  10 mg Oral Daily   vitamin C  500 mg Oral Daily   aspirin EC  81 mg Oral q AM   carvedilol  25 mg Oral BID WC   dextromethorphan-guaiFENesin  1 tablet Oral BID   feeding supplement  237 mL Oral BID BM   ferrous sulfate  325 mg Oral Q breakfast   furosemide  20 mg Oral BID   insulin aspart  0-9 Units Subcutaneous TID WC   isosorbide mononitrate  30 mg Oral Daily   methylPREDNISolone (SOLU-MEDROL) injection  0.5 mg/kg Intravenous Q12H   Followed by   Derrill Memo ON 12/31/2021] predniSONE  50 mg Oral Daily   molnupiravir EUA  4 capsule Oral BID   nicotine  21 mg Transdermal Daily   pantoprazole (PROTONIX) IV  40 mg Intravenous  QHS   polyethylene glycol  17 g Oral Daily   senna  1 tablet Oral BID   zinc sulfate  220 mg Oral Daily   Continuous Infusions:   LOS: 1 day   Shelly Coss, MD [facility] P7/21/2023, 11:56 AM

## 2021-12-30 NOTE — Progress Notes (Signed)
Retreive new albuterol inhaler for patient. Last inhaler missing from room. Placed with spacer.

## 2021-12-31 LAB — COMPREHENSIVE METABOLIC PANEL
ALT: 20 U/L (ref 0–44)
AST: 27 U/L (ref 15–41)
Albumin: 2.7 g/dL — ABNORMAL LOW (ref 3.5–5.0)
Alkaline Phosphatase: 37 U/L — ABNORMAL LOW (ref 38–126)
Anion gap: 5 (ref 5–15)
BUN: 85 mg/dL — ABNORMAL HIGH (ref 8–23)
CO2: 26 mmol/L (ref 22–32)
Calcium: 8.1 mg/dL — ABNORMAL LOW (ref 8.9–10.3)
Chloride: 101 mmol/L (ref 98–111)
Creatinine, Ser: 1.58 mg/dL — ABNORMAL HIGH (ref 0.44–1.00)
GFR, Estimated: 32 mL/min — ABNORMAL LOW (ref >60)
Glucose, Bld: 111 mg/dL — ABNORMAL HIGH (ref 70–99)
Potassium: 4.2 mmol/L (ref 3.5–5.1)
Sodium: 132 mmol/L — ABNORMAL LOW (ref 135–145)
Total Bilirubin: 0.4 mg/dL (ref 0.3–1.2)
Total Protein: 7.5 g/dL (ref 6.5–8.1)

## 2021-12-31 LAB — CBC WITH DIFFERENTIAL/PLATELET
Abs Immature Granulocytes: 0.04 10*3/uL (ref 0.00–0.07)
Basophils Absolute: 0 10*3/uL (ref 0.0–0.1)
Basophils Relative: 0 %
Eosinophils Absolute: 0 10*3/uL (ref 0.0–0.5)
Eosinophils Relative: 0 %
HCT: 26.8 % — ABNORMAL LOW (ref 36.0–46.0)
Hemoglobin: 8.7 g/dL — ABNORMAL LOW (ref 12.0–15.0)
Immature Granulocytes: 1 %
Lymphocytes Relative: 7 %
Lymphs Abs: 0.3 10*3/uL — ABNORMAL LOW (ref 0.7–4.0)
MCH: 29.4 pg (ref 26.0–34.0)
MCHC: 32.5 g/dL (ref 30.0–36.0)
MCV: 90.5 fL (ref 80.0–100.0)
Monocytes Absolute: 0.3 10*3/uL (ref 0.1–1.0)
Monocytes Relative: 6 %
Neutro Abs: 3.9 10*3/uL (ref 1.7–7.7)
Neutrophils Relative %: 86 %
Platelets: 95 10*3/uL — ABNORMAL LOW (ref 150–400)
RBC: 2.96 MIL/uL — ABNORMAL LOW (ref 3.87–5.11)
RDW: 13.9 % (ref 11.5–15.5)
WBC: 4.5 10*3/uL (ref 4.0–10.5)
nRBC: 0 % (ref 0.0–0.2)

## 2021-12-31 LAB — GLUCOSE, CAPILLARY
Glucose-Capillary: 113 mg/dL — ABNORMAL HIGH (ref 70–99)
Glucose-Capillary: 118 mg/dL — ABNORMAL HIGH (ref 70–99)

## 2021-12-31 MED ORDER — DM-GUAIFENESIN ER 30-600 MG PO TB12: 1.0000 | ORAL_TABLET | Freq: Two times a day (BID) | ORAL | 0 refills | Status: AC

## 2021-12-31 MED ORDER — ZINC SULFATE 220 (50 ZN) MG PO CAPS: 220.0000 mg | ORAL_CAPSULE | Freq: Every day | ORAL | 0 refills | Status: AC

## 2021-12-31 MED ORDER — MOLNUPIRAVIR EUA 200MG CAPSULE: 4.0000 | ORAL_CAPSULE | Freq: Two times a day (BID) | ORAL | 0 refills | Status: AC

## 2021-12-31 MED ORDER — GUAIFENESIN-DM 100-10 MG/5ML PO SYRP: 10.0000 mL | ORAL_SOLUTION | ORAL | 0 refills | Status: DC | PRN

## 2021-12-31 MED ORDER — PREDNISONE 50 MG PO TABS: 50.0000 mg | ORAL_TABLET | Freq: Every day | ORAL | 0 refills | Status: AC

## 2021-12-31 MED ORDER — PANTOPRAZOLE SODIUM 40 MG PO TBEC: 40.0000 mg | DELAYED_RELEASE_TABLET | Freq: Every day | ORAL | 0 refills | Status: DC

## 2021-12-31 MED ORDER — COMBIVENT RESPIMAT 20-100 MCG/ACT IN AERS: 1.0000 | INHALATION_SPRAY | Freq: Four times a day (QID) | RESPIRATORY_TRACT | 2 refills | Status: DC | PRN

## 2021-12-31 MED ORDER — ASCORBIC ACID 500 MG PO TABS: 500.0000 mg | ORAL_TABLET | Freq: Every day | ORAL | 0 refills | Status: AC

## 2021-12-31 MED ORDER — ENSURE ENLIVE PO LIQD: 237.0000 mL | Freq: Two times a day (BID) | ORAL | 12 refills | Status: DC

## 2021-12-31 NOTE — Progress Notes (Addendum)
12:30pm: CSW spoke with patient's niece Dewaine Oats to discuss patient's discharge plan. Dewaine Oats states the patient's brother Juanda Crumble will transport the patient home.   CSW spoke with Tommi Rumps at [facility] to update him on new orders and discharge plan.  11am: CSW spoke with Jasmine of [employer] to have home oxygen services initiated. [employer] will deliver the equipment to patient's room before discharge.  Madilyn Fireman, MSW, LCSW Transitions of Care  Clinical Social Worker II [phone]

## 2021-12-31 NOTE — Progress Notes (Signed)
SATURATION QUALIFICATIONS: (This note is used to comply with regulatory documentation for home oxygen)  Patient Saturations on Room Air at Rest = 92%  Patient Saturations on Room Air while Ambulating = 86%  Patient Saturations on 2 Liters of oxygen while Ambulating = 99%  Please briefly explain why patient needs home oxygen:

## 2021-12-31 NOTE — Progress Notes (Signed)
Placed inhaler by the sink. Patient keeps insisting that she needs at the bedside. Inhaler that was left at her bedside yesterday was missplaced. I felt it was best to remove from bedside and be called by RN if needed.

## 2021-12-31 NOTE — Progress Notes (Signed)
Patient waiting for oxygen to be delivered and will be picked up by her brother and taken home.

## 2021-12-31 NOTE — Discharge Summary (Addendum)
Physician Discharge Summary  Lisa [name] ZDG:[id number] DOB: [birth year] DOA: 12/27/2021  PCP: Carrolyn Meiers, MD  Admit date: 12/27/2021  Discharge date: 12/31/2021  Admitted From:Home  Disposition:  Home  Recommendations for Outpatient Follow-up:  Follow up with PCP in 1-2 weeks Continue on Paxlovid for 1 more day and remain on antitussives and Combivent as needed for symptomatic management Continue prednisone and Protonix as ordered Continue home medications as prior  Home Health: Continue prior home health  Equipment/Devices: 2 L nasal cannula oxygen  Discharge Condition:Stable  CODE STATUS: Full  Diet recommendation: Heart Healthy  Brief/Interim Summary: Patient is a 87 year old female with history of COPD, hypertension, chronic hepatitis C, tobacco use, CKD stage IIIb, mild neoplasm of left lung, smoldering myeloma who presents from home with complaint of shortness of breath.  EMS found her to be hypoxic on room air.  On presentation she was hypertensive.  Lab work showed elevated BNP.  COVID screen test was positive.  Chest x-ray did not show any pneumonia.  Patient was started on steroids, molnupiravir for COVID.  She gradually demonstrated improvement in her symptoms over the course of approximately 4 days.  She is currently in stable condition for discharge and will require some home oxygen which has been arranged for her.  No other acute events noted and recommendations are as noted above.  Discharge Diagnoses:  Principal Problem:   COPD with acute exacerbation ([place]) Active Problems:   Smoldering multiple myeloma   Acute respiratory failure with hypoxia (HCC)   COVID-19 virus infection   Elevated brain natriuretic peptide (BNP) level   Elevated troponin   Chronic kidney disease, stage 3b (HCC)   Failure to thrive in adult   Underweight   Pain and swelling of forearm, left   COVID-19  Principal discharge diagnosis: Acute hypoxemic respiratory failure  with COPD exacerbation secondary to COVID infection.  Discharge Instructions  Discharge Instructions     Diet - low sodium heart healthy   Complete by: As directed    Increase activity slowly   Complete by: As directed       Allergies as of 12/31/2021   No Known Allergies      Medication List     TAKE these medications    amLODipine 10 MG tablet Commonly known as: NORVASC Take 1 tablet (10 mg total) by mouth daily.   ascorbic acid 500 MG tablet Commonly known as: VITAMIN C Take 1 tablet (500 mg total) by mouth daily. Start taking on: January 01, 2022   aspirin EC 81 MG tablet Take 81 mg by mouth in the morning.   calcium carbonate 1250 (500 Ca) MG tablet Commonly known as: OS-CAL - dosed in mg of elemental calcium Take 1 tablet by mouth daily with breakfast.   carvedilol 25 MG tablet Commonly known as: COREG Take 1 tablet (25 mg total) by mouth 2 (two) times daily with a meal.   feeding supplement (ENSURE COMPLETE) Liqd Take 237 mLs by mouth 2 (two) times daily between meals. What changed: Another medication with the same name was added. Make sure you understand how and when to take each.   feeding supplement Liqd Take 237 mLs by mouth 2 (two) times daily between meals. What changed: You were already taking a medication with the same name, and this prescription was added. Make sure you understand how and when to take each.   furosemide 20 MG tablet Commonly known as: LASIX Take 20 mg by mouth 2 (two) times  daily. Morning & afternoon   guaiFENesin-dextromethorphan 100-10 MG/5ML syrup Commonly known as: ROBITUSSIN DM Take 10 mLs by mouth every 4 (four) hours as needed for cough.   dextromethorphan-guaiFENesin 30-600 MG 12hr tablet Commonly known as: MUCINEX DM Take 1 tablet by mouth 2 (two) times daily for 7 days.   Ipratropium-Albuterol 20-100 MCG/ACT Aers respimat Commonly known as: COMBIVENT Inhale 1 puff into the lungs every 6 (six) hours as needed for  wheezing or shortness of breath. What changed: Another medication with the same name was added. Make sure you understand how and when to take each.   ipratropium-albuterol 0.5-2.5 (3) MG/3ML Soln Commonly known as: DUONEB Take 3 mLs by nebulization every 4 (four) hours as needed (wheezing/shortness of breath). What changed: Another medication with the same name was added. Make sure you understand how and when to take each.   Combivent Respimat 20-100 MCG/ACT Aers respimat Generic drug: Ipratropium-Albuterol Inhale 1 puff into the lungs every 6 (six) hours as needed for wheezing or shortness of breath. What changed: You were already taking a medication with the same name, and this prescription was added. Make sure you understand how and when to take each.   isosorbide mononitrate 30 MG 24 hr tablet Commonly known as: IMDUR Take 1 tablet (30 mg total) by mouth daily.   megestrol 20 MG tablet Commonly known as: MEGACE Take 20 mg by mouth daily.   molnupiravir EUA 200 mg Caps capsule Commonly known as: LAGEVRIO Take 4 capsules (800 mg total) by mouth 2 (two) times daily for 1 day.   nitroGLYCERIN 0.4 MG SL tablet Commonly known as: NITROSTAT Place 1 tablet (0.4 mg total) under the tongue every 5 (five) minutes as needed for chest pain.   ondansetron 4 MG tablet Commonly known as: ZOFRAN Take 1 tablet (4 mg total) by mouth every 6 (six) hours as needed for nausea.   pantoprazole 40 MG tablet Commonly known as: Protonix Take 1 tablet (40 mg total) by mouth daily for 7 days.   predniSONE 50 MG tablet Commonly known as: DELTASONE Take 1 tablet (50 mg total) by mouth daily for 5 days. Start taking on: January 01, 2022   Vitamin D3 125 MCG (5000 UT) Caps Take 5,000 Units by mouth daily.   zinc sulfate 220 (50 Zn) MG capsule Take 1 capsule (220 mg total) by mouth daily. Start taking on: January 01, 2022               Durable Medical Equipment  (From admission, onward)            Start     Ordered   12/31/21 1106  For home use only DME oxygen  Once       Question Answer Comment  Length of Need 12 Months   Mode or (Route) Nasal cannula   Liters per Minute 2   Frequency Continuous (stationary and portable oxygen unit needed)   Oxygen conserving device Yes   Oxygen delivery system Gas      12/31/21 1106            Follow-up Information     [facility].   Specialty: Home Health Services Why: Will contact you to schedule home health visits. Contact information: [address] [place] [address] [phone]         Carrolyn Meiers, MD. Schedule an appointment as soon as possible for a visit in 1 week(s).   Specialty: Internal Medicine Contact  information: [address] [place] [address] [phone]                No Known Allergies  Consultations: None   Procedures/Studies: DG CHEST PORT 1 VIEW  Result Date: 12/29/2021 CLINICAL DATA:  Shortness of breath EXAM: PORTABLE CHEST 1 VIEW COMPARISON:  12/27/2021 FINDINGS: Transverse diameter of heart is in upper limits of normal. Lung fields are clear of any infiltrates or pulmonary edema. There are few surgical clips overlying the left upper lung field, possibly fiduciary markers or residual change from previous surgery. Left lateral CP angle is not included in the radiograph. There is no significant pleural effusion or pneumothorax. IMPRESSION: There are no signs of pulmonary edema or new focal infiltrates. Electronically Signed   By: Elmer Picker M.D.   On: 12/29/2021 09:59   ECHOCARDIOGRAM COMPLETE  Result Date: 12/28/2021    ECHOCARDIOGRAM REPORT   Patient Name:   Lisa [name] Date of Exam: 12/28/2021 Medical Rec #:  [id number]          Height:       66.0 in Accession #:    [id number]         Weight:       112.9 lb Date of Birth:  03/25/1935           BSA:          1.568 m Patient Age:    19 years            BP:           160/65 mmHg Patient Gender: F                  HR:           71 bpm. Exam Location:  [facility] Procedure: 2D Echo, Cardiac Doppler and Color Doppler Indications:    CHF  History:        Patient has prior history of Echocardiogram examinations, most                 recent 09/03/2021. Previous Myocardial Infarction, COPD,                 Signs/Symptoms:Chest Pain; Risk Factors:Hypertension and Current                 Smoker. Lung CA, CKD, Covid +.  Sonographer:    Wenda Low Referring Phys: [id number] AMRIT ADHIKARI  Sonographer Comments: Image acquisition challenging due to patient body habitus. IMPRESSIONS  1. Left ventricular ejection fraction, by estimation, is 65 to 70%. The left ventricle has normal function. The left ventricle has no regional wall motion abnormalities. Left ventricular diastolic parameters are indeterminate.  2. Right ventricular systolic function is normal. The right ventricular size is normal. There is normal pulmonary artery systolic pressure.  3. Left atrial size was mild to moderately dilated.  4. The mitral valve is normal in structure. Trivial mitral valve regurgitation. No evidence of mitral stenosis.  5. The tricuspid valve is abnormal.  6. The aortic valve has an indeterminant number of cusps. There is mild calcification of the aortic valve. There is mild thickening of the aortic valve. Aortic valve regurgitation is not visualized. No aortic stenosis is present. FINDINGS  Left Ventricle: Left ventricular ejection fraction, by estimation, is 65 to 70%. The left ventricle has normal function. The left ventricle has no regional wall motion abnormalities. The left ventricular internal cavity size was normal in size. There is  no  left ventricular hypertrophy. Left ventricular diastolic parameters are indeterminate. Right Ventricle: The right ventricular size is normal. Right vetricular wall thickness was not well visualized. Right ventricular systolic function is normal.  There is normal pulmonary artery systolic pressure. The tricuspid regurgitant velocity is 2.73 m/s, and with an assumed right atrial pressure of 3 mmHg, the estimated right ventricular systolic pressure is 53.2 mmHg. Left Atrium: Left atrial size was mild to moderately dilated. Right Atrium: Right atrial size was normal in size. Pericardium: There is no evidence of pericardial effusion. Mitral Valve: The mitral valve is normal in structure. There is mild thickening of the mitral valve leaflet(s). There is mild calcification of the mitral valve leaflet(s). Mild mitral annular calcification. Trivial mitral valve regurgitation. No evidence  of mitral valve stenosis. MV peak gradient, 7.2 mmHg. The mean mitral valve gradient is 2.0 mmHg. Tricuspid Valve: The tricuspid valve is abnormal. Tricuspid valve regurgitation is mild . No evidence of tricuspid stenosis. Aortic Valve: The aortic valve has an indeterminant number of cusps. There is mild calcification of the aortic valve. There is mild thickening of the aortic valve. There is mild aortic valve annular calcification. Aortic valve regurgitation is not visualized. No aortic stenosis is present. Aortic valve mean gradient measures 6.0 mmHg. Aortic valve peak gradient measures 11.2 mmHg. Aortic valve area, by VTI measures 1.93 cm. Pulmonic Valve: The pulmonic valve was not well visualized. Pulmonic valve regurgitation is not visualized. No evidence of pulmonic stenosis. Aorta: The aortic root is normal in size and structure. IAS/Shunts: No atrial level shunt detected by color flow Doppler.  LEFT VENTRICLE PLAX 2D LVIDd:         4.50 cm   Diastology LVIDs:         2.50 cm   LV e' medial:    6.20 cm/s LV PW:         0.90 cm   LV E/e' medial:  18.4 LV IVS:        0.90 cm   LV e' lateral:   7.94 cm/s LVOT diam:     2.00 cm   LV E/e' lateral: 14.4 LV SV:         81 LV SV Index:   52 LVOT Area:     3.14 cm  RIGHT VENTRICLE RV Basal diam:  2.95 cm RV Mid diam:    2.20 cm RV S  prime:     11.70 cm/s TAPSE (M-mode): 2.0 cm LEFT ATRIUM             Index        RIGHT ATRIUM           Index LA diam:        4.00 cm 2.55 cm/m   RA Area:     14.20 cm LA Vol (A2C):   59.9 ml 38.20 ml/m  RA Volume:   34.40 ml  21.94 ml/m LA Vol (A4C):   73.3 ml 46.74 ml/m LA Biplane Vol: 68.6 ml 43.74 ml/m  AORTIC VALVE                     PULMONIC VALVE AV Area (Vmax):    1.99 cm      PV Vmax:       0.84 m/s AV Area (Vmean):   1.70 cm      PV Peak grad:  2.8 mmHg AV Area (VTI):     1.93 cm AV Vmax:  167.00 cm/s AV Vmean:          118.000 cm/s AV VTI:            0.421 m AV Peak Grad:      11.2 mmHg AV Mean Grad:      6.0 mmHg LVOT Vmax:         106.00 cm/s LVOT Vmean:        63.900 cm/s LVOT VTI:          0.258 m LVOT/AV VTI ratio: 0.61  AORTA Ao Root diam: 3.20 cm MITRAL VALVE                TRICUSPID VALVE MV Area (PHT): 3.53 cm     TR Peak grad:   29.8 mmHg MV Area VTI:   2.23 cm     TR Vmax:        273.00 cm/s MV Peak grad:  7.2 mmHg MV Mean grad:  2.0 mmHg     SHUNTS MV Vmax:       1.34 m/s     Systemic VTI:  0.26 m MV Vmean:      67.4 cm/s    Systemic Diam: 2.00 cm MV Decel Time: 215 msec MV E velocity: 114.00 cm/s MV A velocity: 99.80 cm/s MV E/A ratio:  1.14 Carlyle Dolly MD Electronically signed by Carlyle Dolly MD Signature Date/Time: 12/28/2021/3:05:03 PM    Final    DG Chest 2 View  Result Date: 12/27/2021 CLINICAL DATA:  Shortness of breath. EXAM: CHEST - 2 VIEW COMPARISON:  CT chest dated December 05, 2021. Chest x-ray dated September 04, 2021. FINDINGS: The heart size and mediastinal contours are within normal limits. Normal pulmonary vascularity. Fiducial markers again noted in the left upper lobe with resolved patchy opacity seen on the prior chest x-ray, likely resolved post biopsy hemorrhage. Small left apical nodule seen on recent CT is not visualized by x-ray. No focal consolidation, pleural effusion, or pneumothorax. No acute osseous abnormality. IMPRESSION: 1. No acute  cardiopulmonary disease. Electronically Signed   By: Titus Dubin M.D.   On: 12/27/2021 21:01   US THYROID  Result Date: 12/21/2021 CLINICAL DATA:  Goiter. EXAM: THYROID ULTRASOUND TECHNIQUE: Ultrasound examination of the thyroid gland and adjacent soft tissues was performed. COMPARISON:  None Available. FINDINGS: Parenchymal Echotexture: Markedly heterogenous Isthmus: 1.2 cm Right lobe: 8.6 x 4.6 x 4.2 cm Left lobe: 8.1 x 5.0 x 5.7 cm _________________________________________________________ Estimated total number of nodules >/= 1 cm: Number of spongiform nodules >/=  2 cm not described below (TR1): 0 Number of mixed cystic and solid nodules >/= 1.5 cm not described below (TR2): 0 _________________________________________________________ Nodule labeled 1 is a solid isoechoic TR 3 nodule in the thyroid isthmus that measures 4.6 x 3.8 x 3.8 cm. **Given size (>/= 2.5 cm) and appearance, fine needle aspiration of this mildly suspicious nodule should be considered based on TI-RADS criteria. Nodule labeled 2 appears to be a small heterogeneous area/pseudo nodule in the thyroid isthmus. Nodule labeled 3 is a solid isoechoic TR 3 nodule in the superior right thyroid lobe that measures 2.3 x 1.8 x 1.3 cm. *Given size (>/= 1.5 - 2.4 cm) and appearance, a follow-up ultrasound in 1 year should be considered based on TI-RADS criteria. Nodule labeled 4 is a solid and heterogeneous but predominantly isoechoic nodule with macrocalcification (TR 4) in the mid right thyroid lobe that measures 5.2 x 4.7 x 3.5 cm. **Given size (>/= 1.5 cm) and appearance, fine needle  aspiration of this moderately suspicious nodule should be considered based on TI-RADS criteria. Nodule labeled 5 is a solid isoechoic TR 3 nodule in the superior left thyroid lobe that measures 3.1 x 2.3 x 2.1 cm. **Given size (>/= 2.5 cm) and appearance, fine needle aspiration of this mildly suspicious nodule should be considered based on TI-RADS criteria. Nodule  labeled 6 is a solid isoechoic nodule with macrocalcification (TR 4) in the mid left thyroid lobe that measures 3.0 x 2.9 x 2.6 cm. **Given size (>/= 1.5 cm) and appearance, fine needle aspiration of this moderately suspicious nodule should be considered based on TI-RADS criteria. Nodule labeled 7 is a large solid isoechoic TR 3 nodule in the inferior left thyroid lobe that measures 6.2 x 4.7 x 4.6 cm. **Given size (>/= 2.5 cm) and appearance, fine needle aspiration of this mildly suspicious nodule should be considered based on TI-RADS criteria. IMPRESSION: 1. Markedly enlarged multinodular thyroid gland. 2. Nodules labeled 1, 4, 5, 6 and 7 all meet criteria for biopsy. Per ACR TI-RADS criteria, the 2 most suspicious thyroid nodules should be biopsied. Therefore, nodules labeled 4 and 6 are recommended for biopsy. 3. Nodule labeled 3 meets criteria for follow-up ultrasound in 1 year. Total follow-up interval of 5 years is recommended, with this exam serving as baseline. The above is in keeping with the ACR TI-RADS recommendations - J Am Coll Radiol 2017;14:587-595. Electronically Signed   By: Albin Felling M.D.   On: 12/21/2021 15:35   CT Chest W Contrast  Result Date: 12/06/2021 CLINICAL DATA:  Non-small cell lung cancer, monitor. Left arm swelling for 2.5 weeks. No known injury. History of multinodular goiter. * Tracking Code: BO * EXAM: CT CHEST WITH CONTRAST TECHNIQUE: Multidetector CT imaging of the chest was performed during intravenous contrast administration. RADIATION DOSE REDUCTION: This exam was performed according to the departmental dose-optimization program which includes automated exposure control, adjustment of the mA and/or kV according to patient size and/or use of iterative reconstruction technique. CONTRAST:  40m OMNIPAQUE IOHEXOL 300 MG/ML  SOLN COMPARISON:  Chest CT without contrast 08/26/2021. CT neck 09/04/2021. Upper extremity venous ultrasound 11/29/2021 and 12/05/2021. FINDINGS:  Cardiovascular: Extensive atherosclerosis of the aorta, great vessels and coronary arteries again noted. No acute vascular findings are identified, although there is possible ostial stenosis of the left common carotid and subclavian arteries. Aortic valvular and mitral annular calcifications are present. The heart size is normal. There is no pericardial effusion. Mediastinum/Nodes: There are no enlarged mediastinal, hilar or axillary lymph nodes.Marked heterogeneous enlargement of both thyroid lobes again noted with associated parenchymal calcifications and constriction of the trachea, similar to previous study. The esophagus appears unremarkable. Lungs/Pleura: No pleural effusion or pneumothorax. Mild centrilobular emphysema and central airway thickening. The spiculated nodule at the left lung apex has decreased in size, now measuring 1.7 x 1.3 cm on image 34/4 (previously 2.1 x 1.6 cm). Adjacent fiduciary markers. No new or enlarging nodules. Upper abdomen: The visualized upper abdomen appears stable without significant findings. Diffuse aortic and branch vessel atherosclerosis. Musculoskeletal/Chest wall: There is no chest wall mass or suspicious osseous finding. Mild spondylosis. IMPRESSION: 1. Interval response to therapy. The dominant spiculated nodule at the left lung apex has decreased in size. 2. No evidence of metastatic disease. 3. No acute vascular findings identified. Possible ostial stenosis of the left common carotid and subclavian arteries. 4. Severe multinodular goiter with resulting mass effect on the trachea, similar to previous imaging. 5. Coronary and aortic atherosclerosis (ICD10-I70.0). Emphysema (  ICD10-J43.9). Electronically Signed   By: Richardean Sale M.D.   On: 12/06/2021 13:41   US Venous Img Upper Uni Left  Result Date: 12/05/2021 CLINICAL DATA:  Swelling x2 0.5 weeks, MGUS EXAM: LEFT UPPER EXTREMITY VENOUS DOPPLER ULTRASOUND TECHNIQUE: Gray-scale sonography with graded compression,  as well as color Doppler and duplex ultrasound were performed to evaluate the upper extremity deep venous system from the level of the subclavian vein and including the jugular, axillary, basilic, radial, ulnar and upper cephalic vein. Spectral Doppler was utilized to evaluate flow at rest and with distal augmentation maneuvers. COMPARISON:  11/29/2021 FINDINGS: Contralateral IJ vein: Respiratory phasicity is normal and symmetric with the symptomatic side. No evidence of thrombus. Normal compressibility. Internal Jugular Vein: No evidence of thrombus. Normal compressibility, respiratory phasicity and response to augmentation. Subclavian Vein: No evidence of thrombus. Normal compressibility, respiratory phasicity and response to augmentation. Axillary Vein: No evidence of thrombus. Normal compressibility, respiratory phasicity and response to augmentation. Cephalic Vein: No evidence of thrombus. Normal compressibility, respiratory phasicity and response to augmentation. Basilic Vein: No evidence of thrombus. Normal compressibility, respiratory phasicity and response to augmentation. Brachial Veins: No evidence of thrombus. Normal compressibility, respiratory phasicity and response to augmentation. Radial Veins: No evidence of thrombus. Normal compressibility, respiratory phasicity and response to augmentation. Ulnar Veins: No evidence of thrombus. Normal compressibility, respiratory phasicity and response to augmentation. Venous Reflux:  None visualized. Other Findings:  Subcutaneous edema in the forearm. IMPRESSION: No evidence of DVT within the left upper extremity. Electronically Signed   By: Lucrezia Europe M.D.   On: 12/05/2021 15:36     Discharge Exam: Vitals:   12/31/21 0658 12/31/21 0820  BP: (!) 193/74   Pulse: 62   Resp:    Temp: 97.7 F (36.5 C)   SpO2: 95% 99%   Vitals:   12/31/21 0500 12/31/21 0653 12/31/21 0658 12/31/21 0820  BP:   (!) 193/74   Pulse:   62   Resp:      Temp:   97.7 F (36.5  C)   TempSrc:   Oral   SpO2:   95% 99%  Weight: 49.6 kg 50.1 kg      General: Pt is alert, awake, not in acute distress Cardiovascular: RRR, S1/S2 +, no rubs, no gallops Respiratory: CTA bilaterally, no wheezing, no rhonchi, currently on 2 L nasal cannula Abdominal: Soft, NT, ND, bowel sounds + Extremities: no edema, no cyanosis    The results of significant diagnostics from this hospitalization (including imaging, microbiology, ancillary and laboratory) are listed below for reference.     Microbiology: Recent Results (from the past 240 hour(s))  Resp Panel by RT-PCR (Flu A&B, Covid) Anterior Nasal Swab     Status: Abnormal   Collection Time: 12/27/21  9:08 PM   Specimen: Anterior Nasal Swab  Result Value Ref Range Status   SARS Coronavirus 2 by RT PCR POSITIVE (A) NEGATIVE Final    Comment: (NOTE) SARS-CoV-2 target nucleic acids are DETECTED.  The SARS-CoV-2 RNA is generally detectable in upper respiratory specimens during the acute phase of infection. Positive results are indicative of the presence of the identified virus, but do not rule out bacterial infection or co-infection with other pathogens not detected by the test. Clinical correlation with patient history and other diagnostic information is necessary to determine patient infection status. The expected result is Negative.  Fact Sheet for Patients: EntrepreneurPulse.com.au  Fact Sheet for Healthcare Providers: IncredibleEmployment.be  This test is not yet approved or cleared by  the [employer] and  has been authorized for detection and/or diagnosis of SARS-CoV-2 by FDA under an Emergency Use Authorization (EUA).  This EUA will remain in effect (meaning this test can be used) for the duration of  the COVID-19 declaration under Section 564(b)(1) of the A ct, 21 U.S.C. section 360bbb-3(b)(1), unless the authorization is terminated or revoked sooner.     Influenza A by  PCR NEGATIVE NEGATIVE Final   Influenza B by PCR NEGATIVE NEGATIVE Final    Comment: (NOTE) The Xpert Xpress SARS-CoV-2/FLU/RSV plus assay is intended as an aid in the diagnosis of influenza from Nasopharyngeal swab specimens and should not be used as a sole basis for treatment. Nasal washings and aspirates are unacceptable for Xpert Xpress SARS-CoV-2/FLU/RSV testing.  Fact Sheet for Patients: EntrepreneurPulse.com.au  Fact Sheet for Healthcare Providers: IncredibleEmployment.be  This test is not yet approved or cleared by the [place] FDA and has been authorized for detection and/or diagnosis of SARS-CoV-2 by FDA under an Emergency Use Authorization (EUA). This EUA will remain in effect (meaning this test can be used) for the duration of the COVID-19 declaration under Section 564(b)(1) of the Act, 21 U.S.C. section 360bbb-3(b)(1), unless the authorization is terminated or revoked.  Performed at [facility], [address] [address] [place], [place] [address]      Labs: BNP (last 3 results) Recent Labs    12/27/21 2026  BNP 711.6*   Basic Metabolic Panel: Recent Labs  Lab 12/27/21 2026 12/28/21 0400 12/29/21 0525 12/30/21 0516 12/31/21 0625  NA 137 137 134* 134* 132*  K 4.3 4.0 4.2 4.3 4.2  CL 104 103 100 100 101  CO2 '27 27 26 27 26  ' GLUCOSE 102* 123* 131* 126* 111*  BUN 39* 39* 56* 70* 85*  CREATININE 1.54* 1.52* 1.44* 1.49* 1.58*  CALCIUM 8.5* 8.5* 8.5* 8.2* 8.1*  MG  --  2.1 2.3  --   --   PHOS  --  4.0 4.8*  --   --    Liver Function Tests: Recent Labs  Lab 12/28/21 0400 12/29/21 0525 12/30/21 0516 12/31/21 0625  AST '28 23 22 27  ' ALT '17 18 18 20  ' ALKPHOS 36* 39 35* 37*  BILITOT 0.5 0.6 0.8 0.4  PROT 8.1 8.1 7.6 7.5  ALBUMIN 2.8* 2.8* 2.7* 2.7*   No results for input(s): "LIPASE", "AMYLASE" in the last 168 hours. No results for input(s): "AMMONIA" in the last 168 hours. CBC: Recent Labs  Lab 12/27/21 2026  12/28/21 0400 12/29/21 0525 12/30/21 0516 12/31/21 0625  WBC 5.7 4.4 6.5 6.0 4.5  NEUTROABS  --   --  5.7 5.4 3.9  HGB 9.0* 8.5* 8.6* 8.7* 8.7*  HCT 27.8* 27.1* 26.4* 27.5* 26.8*  MCV 92.4 93.8 91.7 92.3 90.5  PLT 116* 102* 107* 100* 95*   Cardiac Enzymes: No results for input(s): "CKTOTAL", "CKMB", "CKMBINDEX", "TROPONINI" in the last 168 hours. BNP: Invalid input(s): "POCBNP" CBG: Recent Labs  Lab 12/30/21 0744 12/30/21 1223 12/30/21 1631 12/30/21 2243 12/31/21 0729  GLUCAP 122* 130* 115* 132* 113*   D-Dimer Recent Labs    12/29/21 0525  DDIMER 0.76*   Hgb A1c No results for input(s): "HGBA1C" in the last 72 hours. Lipid Profile No results for input(s): "CHOL", "HDL", "LDLCALC", "TRIG", "CHOLHDL", "LDLDIRECT" in the last 72 hours. Thyroid function studies No results for input(s): "TSH", "T4TOTAL", "T3FREE", "THYROIDAB" in the last 72 hours.  Invalid input(s): "FREET3" Anemia work up [employer]    12/29/21 [phone]  FERRITIN 478*   Urinalysis    Component Value Date/Time   COLORURINE YELLOW 02/07/2021 1550   APPEARANCEUR HAZY (A) 02/07/2021 1550   LABSPEC 1.018 02/07/2021 1550   PHURINE 5.0 02/07/2021 1550   GLUCOSEU NEGATIVE 02/07/2021 1550   HGBUR NEGATIVE 02/07/2021 1550   BILIRUBINUR NEGATIVE 02/07/2021 1550   KETONESUR 5 (A) 02/07/2021 1550   PROTEINUR 30 (A) 02/07/2021 1550   UROBILINOGEN 0.2 06/02/2014 1715   NITRITE NEGATIVE 02/07/2021 1550   LEUKOCYTESUR NEGATIVE 02/07/2021 1550   Sepsis Labs Recent Labs  Lab 12/28/21 0400 12/29/21 0525 12/30/21 0516 12/31/21 0625  WBC 4.4 6.5 6.0 4.5   Microbiology Recent Results (from the past 240 hour(s))  Resp Panel by RT-PCR (Flu A&B, Covid) Anterior Nasal Swab     Status: Abnormal   Collection Time: 12/27/21  9:08 PM   Specimen: Anterior Nasal Swab  Result Value Ref Range Status   SARS Coronavirus 2 by RT PCR POSITIVE (A) NEGATIVE Final    Comment: (NOTE) SARS-CoV-2 target nucleic acids are  DETECTED.  The SARS-CoV-2 RNA is generally detectable in upper respiratory specimens during the acute phase of infection. Positive results are indicative of the presence of the identified virus, but do not rule out bacterial infection or co-infection with other pathogens not detected by the test. Clinical correlation with patient history and other diagnostic information is necessary to determine patient infection status. The expected result is Negative.  Fact Sheet for Patients: EntrepreneurPulse.com.au  Fact Sheet for Healthcare Providers: IncredibleEmployment.be  This test is not yet approved or cleared by the [place] FDA and  has been authorized for detection and/or diagnosis of SARS-CoV-2 by FDA under an Emergency Use Authorization (EUA).  This EUA will remain in effect (meaning this test can be used) for the duration of  the COVID-19 declaration under Section 564(b)(1) of the A ct, 21 U.S.C. section 360bbb-3(b)(1), unless the authorization is terminated or revoked sooner.     Influenza A by PCR NEGATIVE NEGATIVE Final   Influenza B by PCR NEGATIVE NEGATIVE Final    Comment: (NOTE) The Xpert Xpress SARS-CoV-2/FLU/RSV plus assay is intended as an aid in the diagnosis of influenza from Nasopharyngeal swab specimens and should not be used as a sole basis for treatment. Nasal washings and aspirates are unacceptable for Xpert Xpress SARS-CoV-2/FLU/RSV testing.  Fact Sheet for Patients: EntrepreneurPulse.com.au  Fact Sheet for Healthcare Providers: IncredibleEmployment.be  This test is not yet approved or cleared by the [place] FDA and has been authorized for detection and/or diagnosis of SARS-CoV-2 by FDA under an Emergency Use Authorization (EUA). This EUA will remain in effect (meaning this test can be used) for the duration of the COVID-19 declaration under Section 564(b)(1) of the Act, 21  U.S.C. section 360bbb-3(b)(1), unless the authorization is terminated or revoked.  Performed at [facility], [address] [place], [place] [address]      Time coordinating discharge: 35 minutes  SIGNED:   Rodena Goldmann, DO [facility] 12/31/2021, 11:16 AM  If 7PM-7AM, please contact night-coverage [email or web address]

## 2022-01-05 ENCOUNTER — Other Ambulatory Visit (HOSPITAL_COMMUNITY): Payer: Medicare Other

## 2022-01-12 ENCOUNTER — Ambulatory Visit (HOSPITAL_COMMUNITY): Payer: Medicare Other | Admitting: Hematology

## 2022-01-13 DIAGNOSIS — N1832 Chronic kidney disease, stage 3b: Secondary | ICD-10-CM | POA: Diagnosis not present

## 2022-01-13 DIAGNOSIS — I129 Hypertensive chronic kidney disease with stage 1 through stage 4 chronic kidney disease, or unspecified chronic kidney disease: Secondary | ICD-10-CM | POA: Diagnosis not present

## 2022-01-13 DIAGNOSIS — D696 Thrombocytopenia, unspecified: Secondary | ICD-10-CM | POA: Diagnosis not present

## 2022-01-13 DIAGNOSIS — R809 Proteinuria, unspecified: Secondary | ICD-10-CM | POA: Diagnosis not present

## 2022-01-13 DIAGNOSIS — N170 Acute kidney failure with tubular necrosis: Secondary | ICD-10-CM | POA: Diagnosis not present

## 2022-01-25 DIAGNOSIS — N1832 Chronic kidney disease, stage 3b: Secondary | ICD-10-CM | POA: Diagnosis not present

## 2022-01-25 DIAGNOSIS — D638 Anemia in other chronic diseases classified elsewhere: Secondary | ICD-10-CM | POA: Diagnosis not present

## 2022-01-25 DIAGNOSIS — R600 Localized edema: Secondary | ICD-10-CM | POA: Diagnosis not present

## 2022-01-25 DIAGNOSIS — D472 Monoclonal gammopathy: Secondary | ICD-10-CM | POA: Diagnosis not present

## 2022-01-25 DIAGNOSIS — R809 Proteinuria, unspecified: Secondary | ICD-10-CM | POA: Diagnosis not present

## 2022-01-25 DIAGNOSIS — D696 Thrombocytopenia, unspecified: Secondary | ICD-10-CM | POA: Diagnosis not present

## 2022-01-25 DIAGNOSIS — C9001 Multiple myeloma in remission: Secondary | ICD-10-CM | POA: Diagnosis not present

## 2022-01-30 DIAGNOSIS — Z0001 Encounter for general adult medical examination with abnormal findings: Secondary | ICD-10-CM | POA: Diagnosis not present

## 2022-01-30 DIAGNOSIS — I10 Essential (primary) hypertension: Secondary | ICD-10-CM | POA: Diagnosis not present

## 2022-01-30 DIAGNOSIS — R2681 Unsteadiness on feet: Secondary | ICD-10-CM | POA: Diagnosis not present

## 2022-01-30 DIAGNOSIS — Z1389 Encounter for screening for other disorder: Secondary | ICD-10-CM | POA: Diagnosis not present

## 2022-01-30 DIAGNOSIS — J449 Chronic obstructive pulmonary disease, unspecified: Secondary | ICD-10-CM | POA: Diagnosis not present

## 2022-02-15 ENCOUNTER — Inpatient Hospital Stay: Payer: Medicare Other | Attending: Hematology | Admitting: Hematology

## 2022-02-15 VITALS — BP 166/64 | HR 58 | Temp 98.3°F | Resp 17 | Wt 106.2 lb

## 2022-02-15 DIAGNOSIS — E041 Nontoxic single thyroid nodule: Secondary | ICD-10-CM | POA: Diagnosis not present

## 2022-02-15 DIAGNOSIS — F17210 Nicotine dependence, cigarettes, uncomplicated: Secondary | ICD-10-CM | POA: Diagnosis not present

## 2022-02-15 DIAGNOSIS — D649 Anemia, unspecified: Secondary | ICD-10-CM | POA: Diagnosis not present

## 2022-02-15 DIAGNOSIS — Z79899 Other long term (current) drug therapy: Secondary | ICD-10-CM | POA: Diagnosis not present

## 2022-02-15 DIAGNOSIS — C3412 Malignant neoplasm of upper lobe, left bronchus or lung: Secondary | ICD-10-CM | POA: Insufficient documentation

## 2022-02-15 DIAGNOSIS — D472 Monoclonal gammopathy: Secondary | ICD-10-CM | POA: Insufficient documentation

## 2022-02-15 NOTE — Progress Notes (Signed)
[place], [place] [address]   CLINIC:  Medical Oncology/Hematology  PCP:  Carrolyn Meiers, MD [place] / [place] [address] [phone]   REASON FOR VISIT:  Follow-up for MGUS and left lung cancer  PRIOR THERAPY: none  CURRENT THERAPY: suveillance  BRIEF ONCOLOGIC HISTORY:  Oncology History   No history exists.    CANCER STAGING:  Cancer Staging  Adenocarcinoma of left lung [facility]) Staging form: Lung, AJCC 8th Edition - Clinical stage from 09/11/2021: cT1, cN0, cM0 - Unsigned   INTERVAL HISTORY:  [name], a 87 y.o. female, seen for follow-up of MGUS, left upper extremity swelling and thyroid nodules.  She did not have a biopsy of the thyroid done yet.  She reports improvement in left arm swelling.  Energy levels are 70%.  Dyspnea on exertion is stable.  REVIEW OF SYSTEMS:  Review of Systems  Constitutional:  Negative for appetite change and fatigue.  HENT:   Negative for nosebleeds.   Respiratory:  Positive for shortness of breath (COPD). Negative for hemoptysis.   Gastrointestinal:  Negative for blood in stool.  Genitourinary:  Negative for hematuria and vaginal bleeding.   Hematological:  Does not bruise/bleed easily.  All other systems reviewed and are negative.   PAST MEDICAL/SURGICAL HISTORY:  Past Medical History:  Diagnosis Date   Anemia    Carotid artery stenosis    CKD (chronic kidney disease)    COPD (chronic obstructive pulmonary disease) (HCC)    Dyspnea    Due to COPD per patient   Goiter diffuse, nontoxic    Per patient   Hepatitis C    History of radiation therapy    Left lung-10/10/21-10/17/21- Dr. Gery Pray   Hypertension    Monoclonal gammopathy of unknown significance (MGUS)    Pyelonephritis 04/06/2014   UTI   Past Surgical History:  Procedure Laterality Date   ABDOMINAL HYSTERECTOMY     CHOLECYSTECTOMY N/A 09/17/2018   Procedure: LAPAROSCOPIC  CHOLECYSTECTOMY;  Surgeon: Virl Cagey, MD;  Location: [facility];  Service: General;  Laterality: N/A;   FUDUCIAL PLACEMENT Left 09/01/2021   Procedure: PLACEMENT OF FIDUCIAL MARKERS TIMES FOUR;  Surgeon: Melrose Nakayama, MD;  Location: [place];  Service: Thoracic;  Laterality: Left;   TEE WITHOUT CARDIOVERSION N/A 04/10/2014   Procedure: TRANSESOPHAGEAL ECHOCARDIOGRAM (TEE);  Surgeon: [name], MD;  Location: [facility];  Service: Cardiovascular;  Laterality: N/A;   VIDEO BRONCHOSCOPY WITH ENDOBRONCHIAL NAVIGATION N/A 09/01/2021   Procedure: VIDEO BRONCHOSCOPY WITH ENDOBRONCHIAL NAVIGATION;  Surgeon: Melrose Nakayama, MD;  Location: [place];  Service: Thoracic;  Laterality: N/A;    SOCIAL HISTORY:  Social History   Socioeconomic History   Marital status: Widowed    Spouse name: Not on file   Number of children: Not on file   Years of education: Not on file   Highest education level: Not on file  Occupational History   Not on file  Tobacco Use   Smoking status: Every Day    Packs/day: 0.25    Types: Cigarettes   Smokeless tobacco: Never  Vaping Use   Vaping Use: Never used  Substance and Sexual Activity   Alcohol use: No   Drug use: No   Sexual activity: Not on file  Other Topics Concern   Not on file  Social History Narrative   Not on file   Social Determinants of Health   Financial Resource Strain: Medium Risk (  09/21/2021)   Overall Financial Resource Strain (CARDIA)    Difficulty of Paying Living Expenses: Somewhat hard  Food Insecurity: Not on file  Transportation Needs: Not on file  Physical Activity: Inactive (09/21/2021)   Exercise Vital Sign    Days of Exercise per Week: 0 days    Minutes of Exercise per Session: 0 min  Stress: Not on file  Social Connections: Not on file  Intimate Partner Violence: Not on file    FAMILY HISTORY:  Family History  Problem Relation Age of Onset   Hypertension Mother    Thyroid disease Mother    Hypertension  Father    Thyroid disease Sister    Thyroid disease Brother    Thyroid disease Sister     CURRENT MEDICATIONS:  Current Outpatient Medications  Medication Sig Dispense Refill   aspirin 81 MG EC tablet Take 81 mg by mouth in the morning.     calcium carbonate (OS-CAL - DOSED IN MG OF ELEMENTAL CALCIUM) 1250 (500 Ca) MG tablet Take 1 tablet by mouth daily with breakfast.     Cholecalciferol (VITAMIN D3) 125 MCG (5000 UT) CAPS Take 5,000 Units by mouth daily.     feeding supplement (ENSURE ENLIVE / ENSURE PLUS) LIQD Take 237 mLs by mouth 2 (two) times daily between meals. 237 mL 12   feeding supplement, ENSURE COMPLETE, (ENSURE COMPLETE) LIQD Take 237 mLs by mouth 2 (two) times daily between meals.     furosemide (LASIX) 20 MG tablet Take 20 mg by mouth 2 (two) times daily. Morning & afternoon     guaiFENesin-dextromethorphan (ROBITUSSIN DM) 100-10 MG/5ML syrup Take 10 mLs by mouth every 4 (four) hours as needed for cough. 118 mL 0   Ipratropium-Albuterol (COMBIVENT RESPIMAT) 20-100 MCG/ACT AERS respimat Inhale 1 puff into the lungs every 6 (six) hours as needed for wheezing or shortness of breath. 4 g 2   Ipratropium-Albuterol (COMBIVENT) 20-100 MCG/ACT AERS respimat Inhale 1 puff into the lungs every 6 (six) hours as needed for wheezing or shortness of breath.      ipratropium-albuterol (DUONEB) 0.5-2.5 (3) MG/3ML SOLN Take 3 mLs by nebulization every 4 (four) hours as needed (wheezing/shortness of breath).     megestrol (MEGACE) 20 MG tablet Take 20 mg by mouth daily.     nitroGLYCERIN (NITROSTAT) 0.4 MG SL tablet Place 1 tablet (0.4 mg total) under the tongue every 5 (five) minutes as needed for chest pain. 100 tablet 1   ondansetron (ZOFRAN) 4 MG tablet Take 1 tablet (4 mg total) by mouth every 6 (six) hours as needed for nausea. 20 tablet 0   amLODipine (NORVASC) 10 MG tablet Take 1 tablet (10 mg total) by mouth daily. 30 tablet 2   carvedilol (COREG) 25 MG tablet Take 1 tablet (25 mg  total) by mouth 2 (two) times daily with a meal. 60 tablet 2   isosorbide mononitrate (IMDUR) 30 MG 24 hr tablet Take 1 tablet (30 mg total) by mouth daily. 30 tablet 2   pantoprazole (PROTONIX) 40 MG tablet Take 1 tablet (40 mg total) by mouth daily for 7 days. 7 tablet 0   No current facility-administered medications for this visit.    ALLERGIES:  No Known Allergies  PHYSICAL EXAM:  Performance status (ECOG): 2 - Symptomatic, <50% confined to bed  Vitals:   02/15/22 1523  BP: (!) 166/64  Pulse: (!) 58  Resp: 17  Temp: 98.3 F (36.8 C)  SpO2: 94%   Wt Readings from Last 3  Encounters:  02/15/22 106 lb 3.2 oz (48.2 kg)  12/31/21 110 lb 7.2 oz (50.1 kg)  12/22/21 104 lb 9.6 oz (47.4 kg)   Physical Exam Vitals reviewed.  Constitutional:      Appearance: Normal appearance.     Comments: In wheelchair  Cardiovascular:     Rate and Rhythm: Normal rate and regular rhythm.     Pulses: Normal pulses.     Heart sounds: Normal heart sounds.  Pulmonary:     Effort: Pulmonary effort is normal.     Breath sounds: Normal breath sounds.  Neurological:     General: No focal deficit present.     Mental Status: She is alert and oriented to person, place, and time.  Psychiatric:        Mood and Affect: Mood normal.        Behavior: Behavior normal.      LABORATORY DATA:  I have reviewed the labs as listed.     Latest Ref Rng & Units 12/31/2021    6:25 AM 12/30/2021    5:16 AM 12/29/2021    5:25 AM  CBC  WBC 4.0 - 10.5 K/uL 4.5  6.0  6.5   Hemoglobin 12.0 - 15.0 g/dL 8.7  8.7  8.6   Hematocrit 36.0 - 46.0 % 26.8  27.5  26.4   Platelets 150 - 400 K/uL 95  100  107       Latest Ref Rng & Units 12/31/2021    6:25 AM 12/30/2021    5:16 AM 12/29/2021    5:25 AM  CMP  Glucose 70 - 99 mg/dL 111  126  131   BUN 8 - 23 mg/dL 85  70  56   Creatinine 0.44 - 1.00 mg/dL 1.58  1.49  1.44   Sodium 135 - 145 mmol/L 132  134  134   Potassium 3.5 - 5.1 mmol/L 4.2  4.3  4.2   Chloride 98 -  111 mmol/L 101  100  100   CO2 22 - 32 mmol/L 26  27  26    Calcium 8.9 - 10.3 mg/dL 8.1  8.2  8.5   Total Protein 6.5 - 8.1 g/dL 7.5  7.6  8.1   Total Bilirubin 0.3 - 1.2 mg/dL 0.4  0.8  0.6   Alkaline Phos 38 - 126 U/L 37  35  39   AST 15 - 41 U/L 27  22  23    ALT 0 - 44 U/L 20  18  18      DIAGNOSTIC IMAGING:  I have independently reviewed the scans and discussed with the patient. No results found.   ASSESSMENT:  IgG kappa smoldering myeloma: - Patient seen at the request of Dr. Theador Hawthorne for monoclonal gammopathy. - Labs on 05/12/2021 showed M spike 2.4 g.  Immunofixation shows IgG kappa monoclonal protein with a faint IgA lambda monoclonal protein. - Kappa light chains 143, lambda light chains 38, ratio of 3.72. - 24-hour urine total protein 84 mg.  Urine immunofixation shows faint IgG kappa. - No B symptoms or neuropathy. - BMBX on 07/14/2021: Pathology shows hypocellular marrow with clonal plasma cell population comprising 15-20% of the total marrow cellularity.  No overt dysplasia. - Cytogenetics: 45,X-X[9]/46,XX[11] - Myeloma FISH panel: Monosomy 13, duplication 1 q. - Skeletal survey on 06/21/2021: 1 cm circumscribed sclerotic lesion in the distal tibial shaft on the right, nonspecific.  No lytic bone lesion.   Social/family history: - She lives with her nephew.  She is seen  with her brother today.  She uses walker to ambulate. 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 - Current active smoker, half pack per day for 62 years.  3.  Stage I (T1 N0 M0) adenocarcinoma of the left upper lobe of the lung:       - PET scan on 08/04/2021: 1.7 x 1.4 cm left upper lobe nodule SUV 18.  No hypermetabolic mediastinal or hilar adenopathy.  No evidence of metastatic disease. - Navigational bronchoscopy and biopsy by Dr. Roxan Hockey on 09/01/2021.  Not a surgical candidate per Dr. Roxan Hockey. - Pathology consistent with moderately differentiated adenocarcinoma. - SBRT to the  left upper lobe lung lesion, 62 Gray in 3 fractions completed in May 2023   PLAN:  IgG kappa smoldering myeloma: - PET scan on 08/04/2021: Did not show any evidence of myeloma.  No lytic lesions or soft tissue lesions. - Myeloma labs on 12/15/2021 with M spike 2.6 g, up from 2.2 g.  FLC is 3.56, kappa light chains 96, calcium 8.7 and creatinine 1.3 with hemoglobin 8.9. - Recommend checking myeloma panel in 6 to 8 weeks.  2.  Stage I (T1 N0 M0) adenocarcinoma of the left upper lobe of the lung: - CT chest on 12/05/2021: Left lung apex spiculated nodule decreased in size measuring 1.7 x 1.3 cm, previously 2.1 x 1.6 cm.  No evidence of metastatic disease.  3.  Normocytic anemia: - She has anemia from chronic inflammation from CKD.  Latest hemoglobin is 9.1 when checked by Dr. Theador Hawthorne in the first week of August.  Previous ferritin was between 4-500 and saturations were low between 10 and 20%.  If it continues to drop, consider ESA with or without parenteral iron.  4.  Left upper extremity swelling: - Dopplers of the left upper extremity twice in June were negative. - She kept elevating her left upper extremity which helped decrease the swelling. - Today the left arm is back to normal size.  5.  Thyroid nodules: - Thyroid ultrasound shows 2 most suspicious thyroid nodules labeled 4 and 6. - I have recommended biopsy.  We will arrange it locally. - We will follow-up on the results next visit.   Orders placed this encounter:  No orders of the defined types were placed in this encounter.    Derek Jack, MD [place] [phone]

## 2022-02-15 NOTE — Patient Instructions (Addendum)
[place] at [facility] Discharge Instructions   You were seen and examined today by Dr. Delton Coombes.  He suggested you get a biopsy of the nodules on your thyroid because they are suspicious for cancer.   We will repeat blood work prior to next visit.   Return as scheduled.    Thank you for choosing [place] at [facility] to provide your oncology and hematology care.  To afford each patient quality time with our provider, please arrive at least 15 minutes before your scheduled appointment time.   If you have a lab appointment with the [place] please come in thru the Main Entrance and check in at the main information desk.  You need to re-schedule your appointment should you arrive 10 or more minutes late.  We strive to give you quality time with our providers, and arriving late affects you and other patients whose appointments are after yours.  Also, if you no show three or more times for appointments you may be dismissed from the clinic at the providers discretion.     Again, thank you for choosing [facility].  Our hope is that these requests will decrease the amount of time that you wait before being seen by our physicians.       _____________________________________________________________  Should you have questions after your visit to [facility], please contact our office at [phone] and follow the prompts.  Our office hours are 8:00 a.m. and 4:30 p.m. Monday - Friday.  Please note that voicemails left after 4:00 p.m. may not be returned until the following business day.  We are closed weekends and major holidays.  You do have access to a nurse 24-7, just call the main number to the clinic [phone] and do not press any options, hold on the line and a nurse will answer the phone.    For prescription refill requests, have your pharmacy contact our office and allow 72 hours.    Due to Covid, you  will need to wear a mask upon entering the hospital. If you do not have a mask, a mask will be given to you at the Main Entrance upon arrival. For doctor visits, patients may have 1 support person age 25 or older with them. For treatment visits, patients can not have anyone with them due to social distancing guidelines and our immunocompromised population.

## 2022-02-20 ENCOUNTER — Other Ambulatory Visit: Payer: Self-pay | Admitting: *Deleted

## 2022-02-20 DIAGNOSIS — D472 Monoclonal gammopathy: Secondary | ICD-10-CM

## 2022-02-22 ENCOUNTER — Ambulatory Visit (HOSPITAL_COMMUNITY)
Admission: RE | Admit: 2022-02-22 | Discharge: 2022-02-22 | Disposition: A | Discharge status: Home / Self Care | Payer: Medicare Other | Source: Ambulatory Visit | Attending: Hematology | Admitting: Hematology

## 2022-02-22 ENCOUNTER — Encounter (HOSPITAL_COMMUNITY): Payer: Self-pay

## 2022-02-22 DIAGNOSIS — E042 Nontoxic multinodular goiter: Secondary | ICD-10-CM | POA: Diagnosis not present

## 2022-02-22 DIAGNOSIS — E0789 Other specified disorders of thyroid: Secondary | ICD-10-CM | POA: Diagnosis not present

## 2022-02-22 MED ORDER — LIDOCAINE HCL (PF) 2 % IJ SOLN
INTRAMUSCULAR | Status: AC
  Administered 2022-02-22: 10 mL
  Filled 2022-02-22: qty 10

## 2022-02-22 NOTE — Progress Notes (Signed)
PT tolerated double thyroid biopsy procedure well today. Labs and afirmas obtained and sent to lab for processing. PT pushed via wheelchair to family waiting on her with no acute distress noted at departure and verbalized understanding of discharge instructions.

## 2022-02-22 NOTE — Procedures (Signed)
PreOperative Dx: BILATERAL thyroid nodule Postoperative Dx: BILATERAL thyroid nodule Procedure:   US guided FNA of BILATERAL thyroid nodule Radiologist:  Thornton Papas Anesthesia:  5 ml of 2% lidocaine Specimen:  FNA x 5 of RT mid and LT mid thyroid nodlues  EBL:   < 1 ml Complications: None

## 2022-02-27 LAB — CYTOLOGY - NON PAP

## 2022-03-02 DIAGNOSIS — J449 Chronic obstructive pulmonary disease, unspecified: Secondary | ICD-10-CM | POA: Diagnosis not present

## 2022-03-02 DIAGNOSIS — I10 Essential (primary) hypertension: Secondary | ICD-10-CM | POA: Diagnosis not present

## 2022-03-03 DIAGNOSIS — E042 Nontoxic multinodular goiter: Secondary | ICD-10-CM | POA: Diagnosis not present

## 2022-03-15 ENCOUNTER — Encounter (HOSPITAL_COMMUNITY): Payer: Self-pay

## 2022-04-01 DIAGNOSIS — I10 Essential (primary) hypertension: Secondary | ICD-10-CM | POA: Diagnosis not present

## 2022-04-01 DIAGNOSIS — J449 Chronic obstructive pulmonary disease, unspecified: Secondary | ICD-10-CM | POA: Diagnosis not present

## 2022-04-03 ENCOUNTER — Inpatient Hospital Stay: Payer: Medicare Other | Attending: Hematology

## 2022-04-03 DIAGNOSIS — D472 Monoclonal gammopathy: Secondary | ICD-10-CM | POA: Insufficient documentation

## 2022-04-03 DIAGNOSIS — Z923 Personal history of irradiation: Secondary | ICD-10-CM | POA: Insufficient documentation

## 2022-04-03 DIAGNOSIS — F17210 Nicotine dependence, cigarettes, uncomplicated: Secondary | ICD-10-CM | POA: Insufficient documentation

## 2022-04-03 DIAGNOSIS — N1832 Chronic kidney disease, stage 3b: Secondary | ICD-10-CM | POA: Diagnosis not present

## 2022-04-03 DIAGNOSIS — R809 Proteinuria, unspecified: Secondary | ICD-10-CM | POA: Diagnosis not present

## 2022-04-03 DIAGNOSIS — C3412 Malignant neoplasm of upper lobe, left bronchus or lung: Secondary | ICD-10-CM | POA: Insufficient documentation

## 2022-04-03 DIAGNOSIS — R600 Localized edema: Secondary | ICD-10-CM | POA: Diagnosis not present

## 2022-04-03 DIAGNOSIS — Z7982 Long term (current) use of aspirin: Secondary | ICD-10-CM | POA: Insufficient documentation

## 2022-04-03 DIAGNOSIS — D696 Thrombocytopenia, unspecified: Secondary | ICD-10-CM | POA: Diagnosis not present

## 2022-04-03 DIAGNOSIS — Z79899 Other long term (current) drug therapy: Secondary | ICD-10-CM | POA: Insufficient documentation

## 2022-04-05 DIAGNOSIS — D696 Thrombocytopenia, unspecified: Secondary | ICD-10-CM | POA: Diagnosis not present

## 2022-04-05 DIAGNOSIS — R809 Proteinuria, unspecified: Secondary | ICD-10-CM | POA: Diagnosis not present

## 2022-04-05 DIAGNOSIS — D472 Monoclonal gammopathy: Secondary | ICD-10-CM | POA: Diagnosis not present

## 2022-04-05 DIAGNOSIS — D638 Anemia in other chronic diseases classified elsewhere: Secondary | ICD-10-CM | POA: Diagnosis not present

## 2022-04-05 DIAGNOSIS — C9001 Multiple myeloma in remission: Secondary | ICD-10-CM | POA: Diagnosis not present

## 2022-04-05 DIAGNOSIS — N1832 Chronic kidney disease, stage 3b: Secondary | ICD-10-CM | POA: Diagnosis not present

## 2022-04-10 ENCOUNTER — Ambulatory Visit: Payer: Medicare Other | Admitting: Hematology

## 2022-04-10 ENCOUNTER — Inpatient Hospital Stay: Payer: Medicare Other

## 2022-04-10 DIAGNOSIS — D472 Monoclonal gammopathy: Secondary | ICD-10-CM

## 2022-04-10 DIAGNOSIS — C3412 Malignant neoplasm of upper lobe, left bronchus or lung: Secondary | ICD-10-CM | POA: Diagnosis not present

## 2022-04-10 DIAGNOSIS — F17210 Nicotine dependence, cigarettes, uncomplicated: Secondary | ICD-10-CM | POA: Diagnosis not present

## 2022-04-10 DIAGNOSIS — Z923 Personal history of irradiation: Secondary | ICD-10-CM | POA: Diagnosis not present

## 2022-04-10 DIAGNOSIS — Z79899 Other long term (current) drug therapy: Secondary | ICD-10-CM | POA: Diagnosis not present

## 2022-04-10 DIAGNOSIS — Z7982 Long term (current) use of aspirin: Secondary | ICD-10-CM | POA: Diagnosis not present

## 2022-04-10 LAB — COMPREHENSIVE METABOLIC PANEL
ALT: 19 U/L (ref 0–44)
AST: 27 U/L (ref 15–41)
Albumin: 2.8 g/dL — ABNORMAL LOW (ref 3.5–5.0)
Alkaline Phosphatase: 51 U/L (ref 38–126)
Anion gap: 4 — ABNORMAL LOW (ref 5–15)
BUN: 32 mg/dL — ABNORMAL HIGH (ref 8–23)
CO2: 27 mmol/L (ref 22–32)
Calcium: 8.5 mg/dL — ABNORMAL LOW (ref 8.9–10.3)
Chloride: 108 mmol/L (ref 98–111)
Creatinine, Ser: 1.19 mg/dL — ABNORMAL HIGH (ref 0.44–1.00)
GFR, Estimated: 45 mL/min — ABNORMAL LOW (ref >60)
Glucose, Bld: 94 mg/dL (ref 70–99)
Potassium: 3.9 mmol/L (ref 3.5–5.1)
Sodium: 139 mmol/L (ref 135–145)
Total Bilirubin: 0.5 mg/dL (ref 0.3–1.2)
Total Protein: 7.6 g/dL (ref 6.5–8.1)

## 2022-04-10 LAB — CBC WITH DIFFERENTIAL/PLATELET
Abs Immature Granulocytes: 0 10*3/uL (ref 0.00–0.07)
Basophils Absolute: 0 10*3/uL (ref 0.0–0.1)
Basophils Relative: 1 %
Eosinophils Absolute: 0 10*3/uL (ref 0.0–0.5)
Eosinophils Relative: 1 %
HCT: 27.3 % — ABNORMAL LOW (ref 36.0–46.0)
Hemoglobin: 9.1 g/dL — ABNORMAL LOW (ref 12.0–15.0)
Immature Granulocytes: 0 %
Lymphocytes Relative: 30 %
Lymphs Abs: 0.6 10*3/uL — ABNORMAL LOW (ref 0.7–4.0)
MCH: 30.7 pg (ref 26.0–34.0)
MCHC: 33.3 g/dL (ref 30.0–36.0)
MCV: 92.2 fL (ref 80.0–100.0)
Monocytes Absolute: 0.2 10*3/uL (ref 0.1–1.0)
Monocytes Relative: 9 %
Neutro Abs: 1.2 10*3/uL — ABNORMAL LOW (ref 1.7–7.7)
Neutrophils Relative %: 59 %
Platelets: 98 10*3/uL — ABNORMAL LOW (ref 150–400)
RBC: 2.96 MIL/uL — ABNORMAL LOW (ref 3.87–5.11)
RDW: 13.1 % (ref 11.5–15.5)
WBC: 2.1 10*3/uL — ABNORMAL LOW (ref 4.0–10.5)
nRBC: 0 % (ref 0.0–0.2)

## 2022-04-10 LAB — LACTATE DEHYDROGENASE: LDH: 144 U/L (ref 98–192)

## 2022-04-11 LAB — KAPPA/LAMBDA LIGHT CHAINS
Kappa free light chain: 95.2 mg/L — ABNORMAL HIGH (ref 3.3–19.4)
Kappa, lambda light chain ratio: 4.16 — ABNORMAL HIGH (ref 0.26–1.65)
Lambda free light chains: 22.9 mg/L (ref 5.7–26.3)

## 2022-04-13 LAB — PROTEIN ELECTROPHORESIS, SERUM
A/G Ratio: 0.7 (ref 0.7–1.7)
Albumin ELP: 3.1 g/dL (ref 2.9–4.4)
Alpha-1-Globulin: 0.2 g/dL (ref 0.0–0.4)
Alpha-2-Globulin: 0.6 g/dL (ref 0.4–1.0)
Beta Globulin: 0.7 g/dL (ref 0.7–1.3)
Gamma Globulin: 2.9 g/dL — ABNORMAL HIGH (ref 0.4–1.8)
Globulin, Total: 4.4 g/dL — ABNORMAL HIGH (ref 2.2–3.9)
M-Spike, %: 2.6 g/dL — ABNORMAL HIGH
Total Protein ELP: 7.5 g/dL (ref 6.0–8.5)

## 2022-04-13 LAB — IMMUNOFIXATION ELECTROPHORESIS
IgA: 56 mg/dL — ABNORMAL LOW (ref 64–422)
IgG (Immunoglobin G), Serum: 4009 mg/dL — ABNORMAL HIGH (ref 586–1602)
IgM (Immunoglobulin M), Srm: 12 mg/dL — ABNORMAL LOW (ref 26–217)
Total Protein ELP: 7.6 g/dL (ref 6.0–8.5)

## 2022-04-14 ENCOUNTER — Ambulatory Visit: Payer: Medicare Other | Admitting: Internal Medicine

## 2022-04-17 ENCOUNTER — Inpatient Hospital Stay: Payer: Medicare Other

## 2022-04-17 ENCOUNTER — Inpatient Hospital Stay: Payer: Medicare Other | Attending: Hematology | Admitting: Hematology

## 2022-04-17 VITALS — BP 111/73 | HR 57 | Temp 97.6°F | Resp 17 | Ht 67.0 in | Wt 107.0 lb

## 2022-04-17 DIAGNOSIS — D61818 Other pancytopenia: Secondary | ICD-10-CM | POA: Diagnosis not present

## 2022-04-17 DIAGNOSIS — C3412 Malignant neoplasm of upper lobe, left bronchus or lung: Secondary | ICD-10-CM | POA: Insufficient documentation

## 2022-04-17 DIAGNOSIS — Z79899 Other long term (current) drug therapy: Secondary | ICD-10-CM | POA: Diagnosis not present

## 2022-04-17 DIAGNOSIS — F17210 Nicotine dependence, cigarettes, uncomplicated: Secondary | ICD-10-CM | POA: Diagnosis not present

## 2022-04-17 DIAGNOSIS — C3492 Malignant neoplasm of unspecified part of left bronchus or lung: Secondary | ICD-10-CM

## 2022-04-17 DIAGNOSIS — Z23 Encounter for immunization: Secondary | ICD-10-CM | POA: Insufficient documentation

## 2022-04-17 DIAGNOSIS — D509 Iron deficiency anemia, unspecified: Secondary | ICD-10-CM | POA: Diagnosis not present

## 2022-04-17 DIAGNOSIS — D472 Monoclonal gammopathy: Secondary | ICD-10-CM | POA: Insufficient documentation

## 2022-04-17 MED ORDER — INFLUENZA VAC A&B SA ADJ QUAD 0.5 ML IM PRSY
0.5000 mL | PREFILLED_SYRINGE | Freq: Once | INTRAMUSCULAR | Status: AC
  Administered 2022-04-17: 0.5 mL via INTRAMUSCULAR
  Filled 2022-04-17: qty 0.5

## 2022-04-17 NOTE — Progress Notes (Signed)
[place], [place] [address]   CLINIC:  Medical Oncology/Hematology  PCP:  Carrolyn Meiers, MD [place] / [place] [id number] [phone]   REASON FOR VISIT:  Follow-up for MGUS and left lung cancer  PRIOR THERAPY: none  CURRENT THERAPY: suveillance  BRIEF ONCOLOGIC HISTORY:  Oncology History   No history exists.    CANCER STAGING:  Cancer Staging  Adenocarcinoma of left lung [facility]) Staging form: Lung, AJCC 8th Edition - Clinical stage from 09/11/2021: cT1, cN0, cM0 - Unsigned   INTERVAL HISTORY:  [name], a 87 y.o. female, seen for follow-up follow-up of MGUS, left upper extremity swelling and thyroid nodules.  She recently had biopsy of 2 thyroid nodules.  There is slight improvement in the left upper extremity swelling.  Energy levels are reported as 40%.  She has occasional cramping in the hands.  REVIEW OF SYSTEMS:  Review of Systems  Genitourinary:  Positive for frequency.   Hematological:  Does not bruise/bleed easily.  All other systems reviewed and are negative.   PAST MEDICAL/SURGICAL HISTORY:  Past Medical History:  Diagnosis Date   Anemia    Carotid artery stenosis    CKD (chronic kidney disease)    COPD (chronic obstructive pulmonary disease) (HCC)    Dyspnea    Due to COPD per patient   Goiter diffuse, nontoxic    Per patient   Hepatitis C    History of radiation therapy    Left lung-10/10/21-10/17/21- Dr. Gery Pray   Hypertension    Monoclonal gammopathy of unknown significance (MGUS)    Pyelonephritis 04/06/2014   UTI   Past Surgical History:  Procedure Laterality Date   ABDOMINAL HYSTERECTOMY     CHOLECYSTECTOMY N/A 09/17/2018   Procedure: LAPAROSCOPIC CHOLECYSTECTOMY;  Surgeon: Virl Cagey, MD;  Location: [facility];  Service: General;  Laterality: N/A;   FUDUCIAL PLACEMENT Left 09/01/2021   Procedure: PLACEMENT OF FIDUCIAL MARKERS TIMES FOUR;  Surgeon:  Melrose Nakayama, MD;  Location: [place];  Service: Thoracic;  Laterality: Left;   TEE WITHOUT CARDIOVERSION N/A 04/10/2014   Procedure: TRANSESOPHAGEAL ECHOCARDIOGRAM (TEE);  Surgeon: [name], MD;  Location: [facility] ENDOSCOPY;  Service: Cardiovascular;  Laterality: N/A;   VIDEO BRONCHOSCOPY WITH ENDOBRONCHIAL NAVIGATION N/A 09/01/2021   Procedure: VIDEO BRONCHOSCOPY WITH ENDOBRONCHIAL NAVIGATION;  Surgeon: Melrose Nakayama, MD;  Location: [place];  Service: Thoracic;  Laterality: N/A;    SOCIAL HISTORY:  Social History   Socioeconomic History   Marital status: Widowed    Spouse name: Not on file   Number of children: Not on file   Years of education: Not on file   Highest education level: Not on file  Occupational History   Not on file  Tobacco Use   Smoking status: Every Day    Packs/day: 0.25    Types: Cigarettes   Smokeless tobacco: Never  Vaping Use   Vaping Use: Never used  Substance and Sexual Activity   Alcohol use: No   Drug use: No   Sexual activity: Not on file  Other Topics Concern   Not on file  Social History Narrative   Not on file   Social Determinants of Health   Financial Resource Strain: Medium Risk (09/21/2021)   Overall Financial Resource Strain (CARDIA)    Difficulty of Paying Living Expenses: Somewhat hard  Food Insecurity: Not on file  Transportation Needs: Not on file  Physical Activity: Inactive (09/21/2021)  Exercise Vital Sign    Days of Exercise per Week: 0 days    Minutes of Exercise per Session: 0 min  Stress: Not on file  Social Connections: Not on file  Intimate Partner Violence: Not on file    FAMILY HISTORY:  Family History  Problem Relation Age of Onset   Hypertension Mother    Thyroid disease Mother    Hypertension Father    Thyroid disease Sister    Thyroid disease Brother    Thyroid disease Sister     CURRENT MEDICATIONS:  Current Outpatient Medications  Medication Sig Dispense Refill   aspirin 81 MG EC tablet  Take 81 mg by mouth in the morning.     calcium carbonate (OS-CAL - DOSED IN MG OF ELEMENTAL CALCIUM) 1250 (500 Ca) MG tablet Take 1 tablet by mouth daily with breakfast.     Cholecalciferol (VITAMIN D3) 125 MCG (5000 UT) CAPS Take 5,000 Units by mouth daily.     feeding supplement (ENSURE ENLIVE / ENSURE PLUS) LIQD Take 237 mLs by mouth 2 (two) times daily between meals. 237 mL 12   feeding supplement, ENSURE COMPLETE, (ENSURE COMPLETE) LIQD Take 237 mLs by mouth 2 (two) times daily between meals.     furosemide (LASIX) 20 MG tablet Take 20 mg by mouth 2 (two) times daily. Morning & afternoon     guaiFENesin-dextromethorphan (ROBITUSSIN DM) 100-10 MG/5ML syrup Take 10 mLs by mouth every 4 (four) hours as needed for cough. 118 mL 0   Ipratropium-Albuterol (COMBIVENT RESPIMAT) 20-100 MCG/ACT AERS respimat Inhale 1 puff into the lungs every 6 (six) hours as needed for wheezing or shortness of breath. 4 g 2   Ipratropium-Albuterol (COMBIVENT) 20-100 MCG/ACT AERS respimat Inhale 1 puff into the lungs every 6 (six) hours as needed for wheezing or shortness of breath.      ipratropium-albuterol (DUONEB) 0.5-2.5 (3) MG/3ML SOLN Take 3 mLs by nebulization every 4 (four) hours as needed (wheezing/shortness of breath).     megestrol (MEGACE) 20 MG tablet Take 20 mg by mouth daily.     nitroGLYCERIN (NITROSTAT) 0.4 MG SL tablet Place 1 tablet (0.4 mg total) under the tongue every 5 (five) minutes as needed for chest pain. 100 tablet 1   ondansetron (ZOFRAN) 4 MG tablet Take 1 tablet (4 mg total) by mouth every 6 (six) hours as needed for nausea. 20 tablet 0   amLODipine (NORVASC) 10 MG tablet Take 1 tablet (10 mg total) by mouth daily. 30 tablet 2   carvedilol (COREG) 25 MG tablet Take 1 tablet (25 mg total) by mouth 2 (two) times daily with a meal. 60 tablet 2   isosorbide mononitrate (IMDUR) 30 MG 24 hr tablet Take 1 tablet (30 mg total) by mouth daily. 30 tablet 2   pantoprazole (PROTONIX) 40 MG tablet Take  1 tablet (40 mg total) by mouth daily for 7 days. 7 tablet 0   No current facility-administered medications for this visit.    ALLERGIES:  No Known Allergies  PHYSICAL EXAM:  Performance status (ECOG): 2 - Symptomatic, <50% confined to bed  Vitals:   04/17/22 0937  BP: 111/73  Pulse: (!) 57  Resp: 17  Temp: 97.6 F (36.4 C)  SpO2: 100%   Wt Readings from Last 3 Encounters:  04/17/22 107 lb (48.5 kg)  02/15/22 106 lb 3.2 oz (48.2 kg)  12/31/21 110 lb 7.2 oz (50.1 kg)   Physical Exam Vitals reviewed.  Constitutional:      Appearance: Normal  appearance.     Comments: In wheelchair  Cardiovascular:     Rate and Rhythm: Normal rate and regular rhythm.     Pulses: Normal pulses.     Heart sounds: Normal heart sounds.  Pulmonary:     Effort: Pulmonary effort is normal.     Breath sounds: Normal breath sounds.  Neurological:     General: No focal deficit present.     Mental Status: She is alert and oriented to person, place, and time.  Psychiatric:        Mood and Affect: Mood normal.        Behavior: Behavior normal.      LABORATORY DATA:  I have reviewed the labs as listed.     Latest Ref Rng & Units 04/10/2022   10:44 AM 12/31/2021    6:25 AM 12/30/2021    5:16 AM  CBC  WBC 4.0 - 10.5 K/uL 2.1  4.5  6.0   Hemoglobin 12.0 - 15.0 g/dL 9.1  8.7  8.7   Hematocrit 36.0 - 46.0 % 27.3  26.8  27.5   Platelets 150 - 400 K/uL 98  95  100       Latest Ref Rng & Units 04/10/2022   10:44 AM 12/31/2021    6:25 AM 12/30/2021    5:16 AM  CMP  Glucose 70 - 99 mg/dL 94  111  126   BUN 8 - 23 mg/dL 32  85  70   Creatinine 0.44 - 1.00 mg/dL 1.19  1.58  1.49   Sodium 135 - 145 mmol/L 139  132  134   Potassium 3.5 - 5.1 mmol/L 3.9  4.2  4.3   Chloride 98 - 111 mmol/L 108  101  100   CO2 22 - 32 mmol/L _0 Calcium 8.9 - 10.3 mg/dL 8.5  8.1  8.2   Total Protein 6.5 - 8.1 g/dL 7.6  7.5  7.6   Total Bilirubin 0.3 - 1.2 mg/dL 0.5  0.4  0.8   Alkaline Phos 38 - 126 U/L  51  37  35   AST 15 - 41 U/L _1 ALT 0 - 44 U/L _2 DIAGNOSTIC IMAGING:  I have independently reviewed the scans and discussed with the patient. No results found.   ASSESSMENT:  IgG kappa smoldering myeloma: - Patient seen at the request of Dr. Theador Hawthorne for monoclonal gammopathy. - Labs on 05/12/2021 showed M spike 2.4 g.  Immunofixation shows IgG kappa monoclonal protein with a faint IgA lambda monoclonal protein. - Kappa light chains 143, lambda light chains 38, ratio of 3.72. - 24-hour urine total protein 84 mg.  Urine immunofixation shows faint IgG kappa. - No B symptoms or neuropathy. - BMBX on 07/14/2021: Pathology shows hypocellular marrow with clonal plasma cell population comprising 15-20% of the total marrow cellularity.  No overt dysplasia. - Cytogenetics: 45,X-X[9]/46,XX[11] - Myeloma FISH panel: Monosomy 13, duplication 1 q. - Skeletal survey on 06/21/2021: 1 cm circumscribed sclerotic lesion in the distal tibial shaft on the right, nonspecific.  No lytic bone lesion.   Social/family history: - She lives with her nephew.  She is seen with her brother today.  She uses walker to ambulate. 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 - Current active smoker, half pack per day for 62 years.  3.  Stage I (T1 N0 M0) adenocarcinoma  of the left upper lobe of the lung:       - PET scan on 08/04/2021: 1.7 x 1.4 cm left upper lobe nodule SUV 18.  No hypermetabolic mediastinal or hilar adenopathy.  No evidence of metastatic disease. - Navigational bronchoscopy and biopsy by Dr. Roxan Hockey on 09/01/2021.  Not a surgical candidate per Dr. Roxan Hockey. - Pathology consistent with moderately differentiated adenocarcinoma. - SBRT to the left upper lobe lung lesion, 54 Gray in 3 fractions completed in May 2023   PLAN:  IgG kappa smoldering myeloma: - PET scan on 08/04/2021: Did not show any evidence of lytic lesions. - Reviewed myeloma labs from  04/10/2022: M spike stable at 2.6.  Free light chain ratio slightly increased to 4.16.  Kappa light chains are 95 and stable. - Recommend checking myeloma panel again in 3 months.  2.  Stage I (T1 N0 M0) adenocarcinoma of the left upper lobe of the lung: - Last CT chest on 12/05/2021 showed decrease in size of the left apex spiculated nodule.  No evidence of metastatic disease.  I have recommended CT chest without contrast in 6 weeks.  3.  Normocytic anemia/pancytopenia: - She has anemia from chronic inflammation from CKD.  The latest hemoglobin is 9.1.  Will check ferritin and iron panel, Y65 and folic acid at next visit.  Last Feraheme was on 12/28/2021.  Will consider parenteral iron therapy with/without erythropoiesis stimulating agents.  She also has a mild leukopenia and thrombocytopenia since 2020.  Bone marrow biopsy no evidence of dysplasia in the granulocytic precursors and megakaryocytes.  We will closely monitor.  Recommend starting iron tablet every day to every other day if she can tolerate it.  4.  Left upper extremity swelling: - Doppler of the left upper extremity was negative in June. - Overall the swelling has improved. - Continue to keep it elevated.  5.  Thyroid nodules: - Reviewed biopsy of the LML nodule, consistent with benign follicular nodule Bethesda category 2. - Biopsy of the RML-Bethesda category 3, atypia of undetermined significance.  Afirma test was negative. - We discussed biopsy results in detail.   Orders placed this encounter:  Orders Placed This Encounter  Procedures   CT Chest W Contrast   CBC with Differential   Comprehensive metabolic panel   Iron and TIBC ([facility] ONLY)   Ferritin   Vitamin B12   Folate      Derek Jack, MD [place] [phone]

## 2022-04-17 NOTE — Patient Instructions (Signed)
[place] at [facility] Discharge Instructions   You were seen and examined today by Dr. Delton Coombes.  He reviewed the results of your lab work which are normal/stable.  We will plan to repeat a CT scan of your chest next month.   We will see you back after the scan. We will also repeat blood work prior to your next visit.   Thank you for choosing [place] at [facility] to provide your oncology and hematology care.  To afford each patient quality time with our provider, please arrive at least 15 minutes before your scheduled appointment time.   If you have a lab appointment with the [facility] please come in thru the Main Entrance and check in at the main information desk.  You need to re-schedule your appointment should you arrive 10 or more minutes late.  We strive to give you quality time with our providers, and arriving late affects you and other patients whose appointments are after yours.  Also, if you no show three or more times for appointments you may be dismissed from the clinic at the providers discretion.     Again, thank you for choosing [facility].  Our hope is that these requests will decrease the amount of time that you wait before being seen by our physicians.       _____________________________________________________________  Should you have questions after your visit to [facility], please contact our office at [phone] and follow the prompts.  Our office hours are 8:00 a.m. and 4:30 p.m. Monday - Friday.  Please note that voicemails left after 4:00 p.m. may not be returned until the following business day.  We are closed weekends and major holidays.  You do have access to a nurse 24-7, just call the main number to the clinic [phone] and do not press any options, hold on the line and a nurse will answer the phone.    For prescription refill requests, have your pharmacy contact our  office and allow 72 hours.    Due to Covid, you will need to wear a mask upon entering the hospital. If you do not have a mask, a mask will be given to you at the Main Entrance upon arrival. For doctor visits, patients may have 1 support person age 50 or older with them. For treatment visits, patients can not have anyone with them due to social distancing guidelines and our immunocompromised population.

## 2022-04-17 NOTE — Patient Instructions (Signed)
[facility]  Discharge Instructions: Thank you for choosing [facility] to provide your oncology and hematology care.  If you have a lab appointment with the [facility], please come in thru the Main Entrance and check in at the main information desk.  Wear comfortable clothing and clothing appropriate for easy access to any Portacath or PICC line.   We strive to give you quality time with your provider. You may need to reschedule your appointment if you arrive late (15 or more minutes).  Arriving late affects you and other patients whose appointments are after yours.  Also, if you miss three or more appointments without notifying the office, you may be dismissed from the clinic at the provider's discretion.      For prescription refill requests, have your pharmacy contact our office and allow 72 hours for refills to be completed.    Today you received the flu shot       To help prevent nausea and vomiting after your treatment, we encourage you to take your nausea medication as directed.  BELOW ARE SYMPTOMS THAT SHOULD BE REPORTED IMMEDIATELY: *FEVER GREATER THAN 100.4 F (38 C) OR HIGHER *CHILLS OR SWEATING *NAUSEA AND VOMITING THAT IS NOT CONTROLLED WITH YOUR NAUSEA MEDICATION *UNUSUAL SHORTNESS OF BREATH *UNUSUAL BRUISING OR BLEEDING *URINARY PROBLEMS (pain or burning when urinating, or frequent urination) *BOWEL PROBLEMS (unusual diarrhea, constipation, pain near the anus) TENDERNESS IN MOUTH AND THROAT WITH OR WITHOUT PRESENCE OF ULCERS (sore throat, sores in mouth, or a toothache) UNUSUAL RASH, SWELLING OR PAIN  UNUSUAL VAGINAL DISCHARGE OR ITCHING   Items with * indicate a potential emergency and should be followed up as soon as possible or go to the Emergency Department if any problems should occur.  Please show the CHEMOTHERAPY ALERT CARD or IMMUNOTHERAPY ALERT CARD at check-in to the Emergency Department and triage nurse.  Should you have  questions after your visit or need to cancel or reschedule your appointment, please contact [facility] [phone]  and follow the prompts.  Office hours are 8:00 a.m. to 4:30 p.m. Monday - Friday. Please note that voicemails left after 4:00 p.m. may not be returned until the following business day.  We are closed weekends and major holidays. You have access to a nurse at all times for urgent questions. Please call the main number to the clinic [phone] and follow the prompts.  For any non-urgent questions, you may also contact your provider using MyChart. We now offer e-Visits for anyone 37 and older to request care online for non-urgent symptoms. For details visit [email or web address].   Also download the MyChart app! Go to the app store, search "MyChart", open the app, select [facility], and log in with your MyChart username and password.  Masks are optional in the cancer centers. If you would like for your care team to wear a mask while they are taking care of you, please let them know. You may have one support person who is at least 85 years old accompany you for your appointments.

## 2022-04-17 NOTE — Progress Notes (Unsigned)
Patient in office today for follow up visit. Flu shot ordered for patient. See MAR for injection information. Patient remained stable throughout injection. Patient discharged from clinic in wheel chair with daughter in stable condition.

## 2022-05-02 DIAGNOSIS — J449 Chronic obstructive pulmonary disease, unspecified: Secondary | ICD-10-CM | POA: Diagnosis not present

## 2022-05-02 DIAGNOSIS — I10 Essential (primary) hypertension: Secondary | ICD-10-CM | POA: Diagnosis not present

## 2022-05-19 DIAGNOSIS — M6281 Muscle weakness (generalized): Secondary | ICD-10-CM | POA: Diagnosis not present

## 2022-05-19 DIAGNOSIS — J449 Chronic obstructive pulmonary disease, unspecified: Secondary | ICD-10-CM | POA: Diagnosis not present

## 2022-05-19 DIAGNOSIS — I10 Essential (primary) hypertension: Secondary | ICD-10-CM | POA: Diagnosis not present

## 2022-05-19 DIAGNOSIS — R2681 Unsteadiness on feet: Secondary | ICD-10-CM | POA: Diagnosis not present

## 2022-05-29 ENCOUNTER — Inpatient Hospital Stay: Payer: Medicare Other | Attending: Hematology

## 2022-05-29 ENCOUNTER — Ambulatory Visit (HOSPITAL_COMMUNITY)
Admission: RE | Admit: 2022-05-29 | Discharge: 2022-05-29 | Disposition: A | Discharge status: Home / Self Care | Payer: Medicare Other | Source: Ambulatory Visit | Attending: Hematology | Admitting: Hematology

## 2022-05-29 DIAGNOSIS — C3412 Malignant neoplasm of upper lobe, left bronchus or lung: Secondary | ICD-10-CM | POA: Diagnosis not present

## 2022-05-29 DIAGNOSIS — Z79899 Other long term (current) drug therapy: Secondary | ICD-10-CM | POA: Diagnosis not present

## 2022-05-29 DIAGNOSIS — D472 Monoclonal gammopathy: Secondary | ICD-10-CM | POA: Insufficient documentation

## 2022-05-29 DIAGNOSIS — C3490 Malignant neoplasm of unspecified part of unspecified bronchus or lung: Secondary | ICD-10-CM | POA: Diagnosis not present

## 2022-05-29 DIAGNOSIS — F17210 Nicotine dependence, cigarettes, uncomplicated: Secondary | ICD-10-CM | POA: Insufficient documentation

## 2022-05-29 DIAGNOSIS — J439 Emphysema, unspecified: Secondary | ICD-10-CM | POA: Diagnosis not present

## 2022-05-29 DIAGNOSIS — C3492 Malignant neoplasm of unspecified part of left bronchus or lung: Secondary | ICD-10-CM | POA: Insufficient documentation

## 2022-05-29 DIAGNOSIS — Z7982 Long term (current) use of aspirin: Secondary | ICD-10-CM | POA: Insufficient documentation

## 2022-05-29 DIAGNOSIS — D509 Iron deficiency anemia, unspecified: Secondary | ICD-10-CM

## 2022-05-29 DIAGNOSIS — R911 Solitary pulmonary nodule: Secondary | ICD-10-CM | POA: Diagnosis not present

## 2022-05-29 LAB — COMPREHENSIVE METABOLIC PANEL
ALT: 20 U/L (ref 0–44)
AST: 30 U/L (ref 15–41)
Albumin: 2.9 g/dL — ABNORMAL LOW (ref 3.5–5.0)
Alkaline Phosphatase: 46 U/L (ref 38–126)
Anion gap: 4 — ABNORMAL LOW (ref 5–15)
BUN: 38 mg/dL — ABNORMAL HIGH (ref 8–23)
CO2: 28 mmol/L (ref 22–32)
Calcium: 8.9 mg/dL (ref 8.9–10.3)
Chloride: 106 mmol/L (ref 98–111)
Creatinine, Ser: 1.47 mg/dL — ABNORMAL HIGH (ref 0.44–1.00)
GFR, Estimated: 35 mL/min — ABNORMAL LOW (ref >60)
Glucose, Bld: 92 mg/dL (ref 70–99)
Potassium: 4.1 mmol/L (ref 3.5–5.1)
Sodium: 138 mmol/L (ref 135–145)
Total Bilirubin: 0.5 mg/dL (ref 0.3–1.2)
Total Protein: 8.4 g/dL — ABNORMAL HIGH (ref 6.5–8.1)

## 2022-05-29 LAB — CBC WITH DIFFERENTIAL/PLATELET
Abs Immature Granulocytes: 0 10*3/uL (ref 0.00–0.07)
Basophils Absolute: 0 10*3/uL (ref 0.0–0.1)
Basophils Relative: 0 %
Eosinophils Absolute: 0 10*3/uL (ref 0.0–0.5)
Eosinophils Relative: 1 %
HCT: 29.5 % — ABNORMAL LOW (ref 36.0–46.0)
Hemoglobin: 9.6 g/dL — ABNORMAL LOW (ref 12.0–15.0)
Immature Granulocytes: 0 %
Lymphocytes Relative: 29 %
Lymphs Abs: 0.7 10*3/uL (ref 0.7–4.0)
MCH: 29.9 pg (ref 26.0–34.0)
MCHC: 32.5 g/dL (ref 30.0–36.0)
MCV: 91.9 fL (ref 80.0–100.0)
Monocytes Absolute: 0.3 10*3/uL (ref 0.1–1.0)
Monocytes Relative: 11 %
Neutro Abs: 1.4 10*3/uL — ABNORMAL LOW (ref 1.7–7.7)
Neutrophils Relative %: 59 %
Platelets: 106 10*3/uL — ABNORMAL LOW (ref 150–400)
RBC: 3.21 MIL/uL — ABNORMAL LOW (ref 3.87–5.11)
RDW: 12.9 % (ref 11.5–15.5)
WBC: 2.4 10*3/uL — ABNORMAL LOW (ref 4.0–10.5)
nRBC: 0 % (ref 0.0–0.2)

## 2022-05-29 LAB — IRON AND TIBC
Iron: 65 ug/dL (ref 28–170)
Saturation Ratios: 25 % (ref 10.4–31.8)
TIBC: 265 ug/dL (ref 250–450)
UIBC: 200 ug/dL

## 2022-05-29 LAB — VITAMIN B12: Vitamin B-12: 273 pg/mL (ref 180–914)

## 2022-05-29 LAB — FOLATE: Folate: 17 ng/mL (ref >5.9)

## 2022-05-29 LAB — FERRITIN: Ferritin: 495 ng/mL — ABNORMAL HIGH (ref 11–307)

## 2022-05-29 MED ORDER — IOHEXOL 300 MG/ML  SOLN
50.0000 mL | Freq: Once | INTRAMUSCULAR | Status: AC | PRN
  Administered 2022-05-29: 50 mL via INTRAVENOUS

## 2022-06-01 DIAGNOSIS — J449 Chronic obstructive pulmonary disease, unspecified: Secondary | ICD-10-CM | POA: Diagnosis not present

## 2022-06-01 DIAGNOSIS — I10 Essential (primary) hypertension: Secondary | ICD-10-CM | POA: Diagnosis not present

## 2022-06-06 ENCOUNTER — Ambulatory Visit: Payer: Medicare Other | Admitting: Hematology

## 2022-06-14 ENCOUNTER — Inpatient Hospital Stay: Payer: Medicare Other | Attending: Hematology | Admitting: Hematology

## 2022-06-14 VITALS — BP 134/60 | HR 54 | Temp 97.5°F | Resp 18 | Wt 109.5 lb

## 2022-06-14 DIAGNOSIS — C3412 Malignant neoplasm of upper lobe, left bronchus or lung: Secondary | ICD-10-CM | POA: Diagnosis not present

## 2022-06-14 DIAGNOSIS — D509 Iron deficiency anemia, unspecified: Secondary | ICD-10-CM | POA: Diagnosis not present

## 2022-06-14 DIAGNOSIS — E041 Nontoxic single thyroid nodule: Secondary | ICD-10-CM | POA: Insufficient documentation

## 2022-06-14 DIAGNOSIS — D649 Anemia, unspecified: Secondary | ICD-10-CM | POA: Diagnosis not present

## 2022-06-14 DIAGNOSIS — R918 Other nonspecific abnormal finding of lung field: Secondary | ICD-10-CM | POA: Diagnosis not present

## 2022-06-14 DIAGNOSIS — N189 Chronic kidney disease, unspecified: Secondary | ICD-10-CM | POA: Insufficient documentation

## 2022-06-14 DIAGNOSIS — F17210 Nicotine dependence, cigarettes, uncomplicated: Secondary | ICD-10-CM | POA: Diagnosis not present

## 2022-06-14 DIAGNOSIS — Z79899 Other long term (current) drug therapy: Secondary | ICD-10-CM | POA: Insufficient documentation

## 2022-06-14 DIAGNOSIS — D472 Monoclonal gammopathy: Secondary | ICD-10-CM | POA: Diagnosis not present

## 2022-06-14 NOTE — Progress Notes (Signed)
[name], [place] [address]   CLINIC:  Medical Oncology/Hematology  PCP:  Carrolyn Meiers, MD [place] / [place] [address] [phone]   REASON FOR VISIT:  Follow-up for MGUS and left lung cancer  PRIOR THERAPY: none  CURRENT THERAPY: suveillance  BRIEF ONCOLOGIC HISTORY:  Oncology History   No history exists.    CANCER STAGING:  Cancer Staging  Adenocarcinoma of left lung [facility]) Staging form: Lung, AJCC 8th Edition - Clinical stage from 09/11/2021: cT1, cN0, cM0 - Unsigned   INTERVAL HISTORY:  [name], a 88 y.o. female, seen for follow-up of MGUS, anemia.  She reports that she has been doing well.  Denies any swelling of the left upper extremity.  No new pains.  No bleeding per rectum or melena.  Taking iron tablet daily.  REVIEW OF SYSTEMS:  Review of Systems  Respiratory:  Positive for shortness of breath.   Hematological:  Does not bruise/bleed easily.  All other systems reviewed and are negative.   PAST MEDICAL/SURGICAL HISTORY:  Past Medical History:  Diagnosis Date   Anemia    Carotid artery stenosis    CKD (chronic kidney disease)    COPD (chronic obstructive pulmonary disease) (HCC)    Dyspnea    Due to COPD per patient   Goiter diffuse, nontoxic    Per patient   Hepatitis C    History of radiation therapy    Left lung-10/10/21-10/17/21- Dr. Gery Pray   Hypertension    Monoclonal gammopathy of unknown significance (MGUS)    Pyelonephritis 04/06/2014   UTI   Past Surgical History:  Procedure Laterality Date   ABDOMINAL HYSTERECTOMY     CHOLECYSTECTOMY N/A 09/17/2018   Procedure: LAPAROSCOPIC CHOLECYSTECTOMY;  Surgeon: Virl Cagey, MD;  Location: [facility];  Service: General;  Laterality: N/A;   FUDUCIAL PLACEMENT Left 09/01/2021   Procedure: PLACEMENT OF FIDUCIAL MARKERS TIMES FOUR;  Surgeon: Melrose Nakayama, MD;  Location: [facility];  Service: Thoracic;   Laterality: Left;   TEE WITHOUT CARDIOVERSION N/A 04/10/2014   Procedure: TRANSESOPHAGEAL ECHOCARDIOGRAM (TEE);  Surgeon: [name], MD;  Location: [facility] ENDOSCOPY;  Service: Cardiovascular;  Laterality: N/A;   VIDEO BRONCHOSCOPY WITH ENDOBRONCHIAL NAVIGATION N/A 09/01/2021   Procedure: VIDEO BRONCHOSCOPY WITH ENDOBRONCHIAL NAVIGATION;  Surgeon: Melrose Nakayama, MD;  Location: [facility];  Service: Thoracic;  Laterality: N/A;    SOCIAL HISTORY:  Social History   Socioeconomic History   Marital status: Widowed    Spouse name: Not on file   Number of children: Not on file   Years of education: Not on file   Highest education level: Not on file  Occupational History   Not on file  Tobacco Use   Smoking status: Every Day    Packs/day: 0.25    Types: Cigarettes   Smokeless tobacco: Never  Vaping Use   Vaping Use: Never used  Substance and Sexual Activity   Alcohol use: No   Drug use: No   Sexual activity: Not on file  Other Topics Concern   Not on file  Social History Narrative   Not on file   Social Determinants of Health   Financial Resource Strain: Medium Risk (09/21/2021)   Overall Financial Resource Strain (CARDIA)    Difficulty of Paying Living Expenses: Somewhat hard  Food Insecurity: Not on file  Transportation Needs: Not on file  Physical Activity: Inactive (09/21/2021)   Exercise Vital Sign  Days of Exercise per Week: 0 days    Minutes of Exercise per Session: 0 min  Stress: Not on file  Social Connections: Not on file  Intimate Partner Violence: Not on file    FAMILY HISTORY:  Family History  Problem Relation Age of Onset   Hypertension Mother    Thyroid disease Mother    Hypertension Father    Thyroid disease Sister    Thyroid disease Brother    Thyroid disease Sister     CURRENT MEDICATIONS:  Current Outpatient Medications  Medication Sig Dispense Refill   aspirin 81 MG EC tablet Take 81 mg by mouth in the morning.     calcium carbonate (OS-CAL  - DOSED IN MG OF ELEMENTAL CALCIUM) 1250 (500 Ca) MG tablet Take 1 tablet by mouth daily with breakfast.     Cholecalciferol (VITAMIN D3) 125 MCG (5000 UT) CAPS Take 5,000 Units by mouth daily.     feeding supplement (ENSURE ENLIVE / ENSURE PLUS) LIQD Take 237 mLs by mouth 2 (two) times daily between meals. 237 mL 12   feeding supplement, ENSURE COMPLETE, (ENSURE COMPLETE) LIQD Take 237 mLs by mouth 2 (two) times daily between meals.     furosemide (LASIX) 20 MG tablet Take 20 mg by mouth 2 (two) times daily. Morning & afternoon     guaiFENesin-dextromethorphan (ROBITUSSIN DM) 100-10 MG/5ML syrup Take 10 mLs by mouth every 4 (four) hours as needed for cough. 118 mL 0   Ipratropium-Albuterol (COMBIVENT RESPIMAT) 20-100 MCG/ACT AERS respimat Inhale 1 puff into the lungs every 6 (six) hours as needed for wheezing or shortness of breath. 4 g 2   Ipratropium-Albuterol (COMBIVENT) 20-100 MCG/ACT AERS respimat Inhale 1 puff into the lungs every 6 (six) hours as needed for wheezing or shortness of breath.      ipratropium-albuterol (DUONEB) 0.5-2.5 (3) MG/3ML SOLN Take 3 mLs by nebulization every 4 (four) hours as needed (wheezing/shortness of breath).     Iron, Ferrous Sulfate, 325 (65 Fe) MG TABS Take by mouth.     megestrol (MEGACE) 20 MG tablet Take 20 mg by mouth daily.     amLODipine (NORVASC) 10 MG tablet Take 1 tablet (10 mg total) by mouth daily. 30 tablet 2   carvedilol (COREG) 25 MG tablet Take 1 tablet (25 mg total) by mouth 2 (two) times daily with a meal. 60 tablet 2   isosorbide mononitrate (IMDUR) 30 MG 24 hr tablet Take 1 tablet (30 mg total) by mouth daily. 30 tablet 2   nitroGLYCERIN (NITROSTAT) 0.4 MG SL tablet Place 1 tablet (0.4 mg total) under the tongue every 5 (five) minutes as needed for chest pain. (Patient not taking: Reported on 06/14/2022) 100 tablet 1   ondansetron (ZOFRAN) 4 MG tablet Take 1 tablet (4 mg total) by mouth every 6 (six) hours as needed for nausea. (Patient not  taking: Reported on 06/14/2022) 20 tablet 0   pantoprazole (PROTONIX) 40 MG tablet Take 1 tablet (40 mg total) by mouth daily for 7 days. 7 tablet 0   No current facility-administered medications for this visit.    ALLERGIES:  No Known Allergies  PHYSICAL EXAM:  Performance status (ECOG): 2 - Symptomatic, <50% confined to bed  Vitals:   06/14/22 1106  BP: 134/60  Pulse: (!) 54  Resp: 18  Temp: (!) 97.5 F (36.4 C)  SpO2: 97%   Wt Readings from Last 3 Encounters:  06/14/22 109 lb 8 oz (49.7 kg)  04/17/22 107 lb (48.5 kg)  02/15/22 106 lb 3.2 oz (48.2 kg)   Physical Exam Vitals reviewed.  Constitutional:      Appearance: Normal appearance.     Comments: In wheelchair  Cardiovascular:     Rate and Rhythm: Normal rate and regular rhythm.     Pulses: Normal pulses.     Heart sounds: Normal heart sounds.  Pulmonary:     Effort: Pulmonary effort is normal.     Breath sounds: Normal breath sounds.  Neurological:     General: No focal deficit present.     Mental Status: She is alert and oriented to person, place, and time.  Psychiatric:        Mood and Affect: Mood normal.        Behavior: Behavior normal.      LABORATORY DATA:  I have reviewed the labs as listed.     Latest Ref Rng & Units 05/29/2022    1:44 PM 04/10/2022   10:44 AM 12/31/2021    6:25 AM  CBC  WBC 4.0 - 10.5 K/uL 2.4  2.1  4.5   Hemoglobin 12.0 - 15.0 g/dL 9.6  9.1  8.7   Hematocrit 36.0 - 46.0 % 29.5  27.3  26.8   Platelets 150 - 400 K/uL 106  98  95       Latest Ref Rng & Units 05/29/2022    1:44 PM 04/10/2022   10:44 AM 12/31/2021    6:25 AM  CMP  Glucose 70 - 99 mg/dL 92  94  111   BUN 8 - 23 mg/dL 38  32  85   Creatinine 0.44 - 1.00 mg/dL 1.47  1.19  1.58   Sodium 135 - 145 mmol/L 138  139  132   Potassium 3.5 - 5.1 mmol/L 4.1  3.9  4.2   Chloride 98 - 111 mmol/L 106  108  101   CO2 22 - 32 mmol/L _0 Calcium 8.9 - 10.3 mg/dL 8.9  8.5  8.1   Total Protein 6.5 - 8.1 g/dL 8.4   7.6  7.5   Total Bilirubin 0.3 - 1.2 mg/dL 0.5  0.5  0.4   Alkaline Phos 38 - 126 U/L 46  51  37   AST 15 - 41 U/L _1 ALT 0 - 44 U/L _2 DIAGNOSTIC IMAGING:  I have independently reviewed the scans and discussed with the patient. CT Chest W Contrast  Result Date: 05/31/2022 CLINICAL DATA:  Non-small-cell lung cancer. Restaging. * Tracking Code: BO * EXAM: CT CHEST WITH CONTRAST TECHNIQUE: Multidetector CT imaging of the chest was performed during intravenous contrast administration. RADIATION DOSE REDUCTION: This exam was performed according to the departmental dose-optimization program which includes automated exposure control, adjustment of the mA and/or kV according to patient size and/or use of iterative reconstruction technique. CONTRAST:  40m OMNIPAQUE IOHEXOL 300 MG/ML  SOLN COMPARISON:  12/05/2021 FINDINGS: Cardiovascular: The heart size is normal. No substantial pericardial effusion. Coronary artery calcification is evident. Moderate atherosclerotic calcification is noted in the wall of the thoracic aorta. Mediastinum/Nodes: Upper normal 9 mm short axis AP window lymph node was 5 mm previously. No mediastinal lymphadenopathy. Multinodular thyroid enlargement is similar to prior. This has been evaluated on previous imaging. (ref: J Am Coll Radiol. 2015 Feb;12(2): 143-50).There is no hilar lymphadenopathy. The esophagus has normal imaging features. There is no axillary lymphadenopathy. Lungs/Pleura: Centrilobular emphysema noted. Posterior  left upper lobe pulmonary nodule with adjacent fiducial markers identified on 34/4. Previously measured at 17 x 13 mm, this nodule measures 12 x 10 mm today. Adjacent interstitial and alveolar opacities compatible with evolving post treatment scarring and adjacent pleural thickening again noted, similar to prior. No new suspicious pulmonary nodule or mass. No focal airspace consolidation. No pleural effusion. Upper Abdomen: Unremarkable.  Musculoskeletal: No worrisome lytic or sclerotic osseous abnormality. IMPRESSION: 1. Interval decrease in size of the posterior left upper lobe pulmonary nodule with adjacent fiducial markers. 2. Upper normal AP window lymph node has increased in size in the interval. Close attention on follow-up recommended. 3.  Aortic Atherosclerosis (ICD10-I70.0). 4.  Emphysema. (JAS50-N39.9) Electronically Signed   By: Misty Stanley M.D.   On: 05/31/2022 13:19     ASSESSMENT:  IgG kappa smoldering myeloma: - Patient seen at the request of Dr. Theador Hawthorne for monoclonal gammopathy. - Labs on 05/12/2021 showed M spike 2.4 g.  Immunofixation shows IgG kappa monoclonal protein with a faint IgA lambda monoclonal protein. - Kappa light chains 143, lambda light chains 38, ratio of 3.72. - 24-hour urine total protein 84 mg.  Urine immunofixation shows faint IgG kappa. - No B symptoms or neuropathy. - BMBX on 07/14/2021: Pathology shows hypocellular marrow with clonal plasma cell population comprising 15-20% of the total marrow cellularity.  No overt dysplasia. - Cytogenetics: 45,X-X[9]/46,XX[11] - Myeloma FISH panel: Monosomy 13, duplication 1 q. - Skeletal survey on 06/21/2021: 1 cm circumscribed sclerotic lesion in the distal tibial shaft on the right, nonspecific.  No lytic bone lesion.   Social/family history: - She lives with her nephew.  She is seen with her brother today.  She uses walker to ambulate. 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 - Current active smoker, half pack per day for 62 years.  3.  Stage I (T1 N0 M0) adenocarcinoma of the left upper lobe of the lung:       - PET scan on 08/04/2021: 1.7 x 1.4 cm left upper lobe nodule SUV 18.  No hypermetabolic mediastinal or hilar adenopathy.  No evidence of metastatic disease. - Navigational bronchoscopy and biopsy by Dr. Roxan Hockey on 09/01/2021.  Not a surgical candidate per Dr. Roxan Hockey. - Pathology consistent with moderately  differentiated adenocarcinoma. - SBRT to the left upper lobe lung lesion, 54 Gray in 3 fractions completed in May 2023  4.  Thyroid nodules: - Reviewed biopsy of the LML nodule, consistent with benign follicular nodule Bethesda category 2. - Biopsy of the RML-Bethesda category 3, atypia of undetermined significance.  Afirma test was negative.  PLAN:  IgG kappa smoldering myeloma: -PET scan on 08/04/2021: Did not show any lytic lesions. - Myeloma labs on 04/10/2022 with M spike 2.6, stable.  Free light chain ratio is 4.16 with kappa light chains 95, which is stable.  Immunofixation shows IgG kappa. - Creatinine is stable at 1.47 and normal calcium.  Hemoglobin is also stable. - No "crab" features. - Recommend follow-up in 5 months with repeat myeloma labs and skeletal survey.  2.  Stage I (T1 N0 M0) adenocarcinoma of the left upper lobe of the lung: - CT chest (05/29/2022): Interval decrease in size of the left upper lobe lung nodule which was treated with SBRT.  AP window lymph node increased to 9 mm, previously 5 mm in June 2023.  No other lymphadenopathy. - I have recommended follow-up CT scan in 6 months.  3.  Normocytic anemia/pancytopenia: - Anemia from  chronic inflammation and CKD. - Ferritin is 495, percent saturation 25.  B12 and folate are normal.  Hemoglobin is 9.6. - Continue iron tablet daily.  Will consider ESA if hemoglobin drops below 8.     Orders placed this encounter:  Orders Placed This Encounter  Procedures   CT Chest W Contrast   DG Bone Survey Met   CBC with Differential   Comprehensive metabolic panel   Lactate dehydrogenase   Iron and TIBC ([facility] ONLY)   Ferritin   Vitamin B12   Kappa/lambda light chains   Protein electrophoresis, serum      Derek Jack, MD [place] [phone]

## 2022-06-20 DIAGNOSIS — D696 Thrombocytopenia, unspecified: Secondary | ICD-10-CM | POA: Diagnosis not present

## 2022-06-20 DIAGNOSIS — D472 Monoclonal gammopathy: Secondary | ICD-10-CM | POA: Diagnosis not present

## 2022-06-20 DIAGNOSIS — N1832 Chronic kidney disease, stage 3b: Secondary | ICD-10-CM | POA: Diagnosis not present

## 2022-06-20 DIAGNOSIS — C9001 Multiple myeloma in remission: Secondary | ICD-10-CM | POA: Diagnosis not present

## 2022-06-20 DIAGNOSIS — R809 Proteinuria, unspecified: Secondary | ICD-10-CM | POA: Diagnosis not present

## 2022-06-23 DIAGNOSIS — E875 Hyperkalemia: Secondary | ICD-10-CM | POA: Diagnosis not present

## 2022-06-23 DIAGNOSIS — N1832 Chronic kidney disease, stage 3b: Secondary | ICD-10-CM | POA: Diagnosis not present

## 2022-06-23 DIAGNOSIS — D638 Anemia in other chronic diseases classified elsewhere: Secondary | ICD-10-CM | POA: Diagnosis not present

## 2022-06-23 DIAGNOSIS — D696 Thrombocytopenia, unspecified: Secondary | ICD-10-CM | POA: Diagnosis not present

## 2022-06-23 DIAGNOSIS — C9001 Multiple myeloma in remission: Secondary | ICD-10-CM | POA: Diagnosis not present

## 2022-06-23 DIAGNOSIS — D72819 Decreased white blood cell count, unspecified: Secondary | ICD-10-CM | POA: Diagnosis not present

## 2022-07-02 DIAGNOSIS — J449 Chronic obstructive pulmonary disease, unspecified: Secondary | ICD-10-CM | POA: Diagnosis not present

## 2022-07-02 DIAGNOSIS — I10 Essential (primary) hypertension: Secondary | ICD-10-CM | POA: Diagnosis not present

## 2022-07-07 ENCOUNTER — Telehealth: Payer: Self-pay | Admitting: Internal Medicine

## 2022-07-07 NOTE — Telephone Encounter (Signed)
Lisa [name] patient regarding recall appointment, patient notified our office they did not wish to keep this appointment at this time.  Deleted recall from system.  27MO F/U  1/11/ spoke with care giver in regards to past due appt. she will call back if patient wants to come in for an appointment .

## 2022-07-10 DIAGNOSIS — D696 Thrombocytopenia, unspecified: Secondary | ICD-10-CM | POA: Diagnosis not present

## 2022-07-10 DIAGNOSIS — D72819 Decreased white blood cell count, unspecified: Secondary | ICD-10-CM | POA: Diagnosis not present

## 2022-07-10 DIAGNOSIS — N1832 Chronic kidney disease, stage 3b: Secondary | ICD-10-CM | POA: Diagnosis not present

## 2022-07-10 DIAGNOSIS — C9001 Multiple myeloma in remission: Secondary | ICD-10-CM | POA: Diagnosis not present

## 2022-07-10 DIAGNOSIS — E875 Hyperkalemia: Secondary | ICD-10-CM | POA: Diagnosis not present

## 2022-08-01 DIAGNOSIS — C3492 Malignant neoplasm of unspecified part of left bronchus or lung: Secondary | ICD-10-CM | POA: Diagnosis not present

## 2022-08-01 DIAGNOSIS — D472 Monoclonal gammopathy: Secondary | ICD-10-CM | POA: Diagnosis not present

## 2022-08-01 DIAGNOSIS — R2681 Unsteadiness on feet: Secondary | ICD-10-CM | POA: Diagnosis not present

## 2022-08-01 DIAGNOSIS — J449 Chronic obstructive pulmonary disease, unspecified: Secondary | ICD-10-CM | POA: Diagnosis not present

## 2022-08-01 DIAGNOSIS — E041 Nontoxic single thyroid nodule: Secondary | ICD-10-CM | POA: Diagnosis not present

## 2022-08-01 DIAGNOSIS — I10 Essential (primary) hypertension: Secondary | ICD-10-CM | POA: Diagnosis not present

## 2022-08-30 DIAGNOSIS — J449 Chronic obstructive pulmonary disease, unspecified: Secondary | ICD-10-CM | POA: Diagnosis not present

## 2022-08-30 DIAGNOSIS — I10 Essential (primary) hypertension: Secondary | ICD-10-CM | POA: Diagnosis not present

## 2022-09-04 ENCOUNTER — Encounter: Payer: Self-pay | Admitting: Internal Medicine

## 2022-09-04 ENCOUNTER — Ambulatory Visit (INDEPENDENT_AMBULATORY_CARE_PROVIDER_SITE_OTHER): Payer: Medicare Other | Admitting: Internal Medicine

## 2022-09-04 VITALS — BP 98/49 | HR 73 | Ht 65.0 in | Wt 105.0 lb

## 2022-09-04 DIAGNOSIS — Z0001 Encounter for general adult medical examination with abnormal findings: Secondary | ICD-10-CM | POA: Diagnosis not present

## 2022-09-04 DIAGNOSIS — J438 Other emphysema: Secondary | ICD-10-CM | POA: Diagnosis not present

## 2022-09-04 DIAGNOSIS — E042 Nontoxic multinodular goiter: Secondary | ICD-10-CM

## 2022-09-04 DIAGNOSIS — I10 Essential (primary) hypertension: Secondary | ICD-10-CM

## 2022-09-04 DIAGNOSIS — N184 Chronic kidney disease, stage 4 (severe): Secondary | ICD-10-CM

## 2022-09-04 DIAGNOSIS — Z720 Tobacco use: Secondary | ICD-10-CM | POA: Diagnosis not present

## 2022-09-04 DIAGNOSIS — D472 Monoclonal gammopathy: Secondary | ICD-10-CM

## 2022-09-04 DIAGNOSIS — C3492 Malignant neoplasm of unspecified part of left bronchus or lung: Secondary | ICD-10-CM

## 2022-09-04 DIAGNOSIS — R278 Other lack of coordination: Secondary | ICD-10-CM | POA: Diagnosis not present

## 2022-09-04 DIAGNOSIS — I214 Non-ST elevation (NSTEMI) myocardial infarction: Secondary | ICD-10-CM | POA: Diagnosis not present

## 2022-09-04 DIAGNOSIS — B182 Chronic viral hepatitis C: Secondary | ICD-10-CM

## 2022-09-04 DIAGNOSIS — D509 Iron deficiency anemia, unspecified: Secondary | ICD-10-CM | POA: Diagnosis not present

## 2022-09-04 NOTE — Assessment & Plan Note (Signed)
Previously documented history of chronic HCV.  Evaluated by gastroenterology in March 2023 and deemed not a candidate for treatment in the absence of symptoms or evidence of cirrhosis.

## 2022-09-04 NOTE — Assessment & Plan Note (Signed)
Previously documented history of multinodular goiter s/p biopsy of multiple thyroid nodules completed in September 2023.  Pathology was consistent with benign etiology. -Recent labs have been requested.  Update thyroid studies accordingly.

## 2022-09-04 NOTE — Assessment & Plan Note (Signed)
Previously documented history of MGUS characterized as smoldering myeloma.  Followed by oncology (Dr. Delton Coombes).  Most recent labs are stable. -Oncology follow-up scheduled for June

## 2022-09-04 NOTE — Assessment & Plan Note (Signed)
Previously documented history of COPD.  She is asymptomatic currently.  She is prescribed Combivent and DuoNebs for as needed use every 6 hours. -No medication changes today

## 2022-09-04 NOTE — Assessment & Plan Note (Signed)
History of adenocarcinoma of the left lung s/p radiation treatment completed May 2023.  Followed by oncology (Dr. Delton Coombes).  Last seen for follow-up in January.  Repeat CT scan recommended for 6 months.

## 2022-09-04 NOTE — Progress Notes (Signed)
New Patient Office Visit  Subjective    Patient ID: Lisa [name], female    DOB: 1934/08/02  Age: 88 y.o. MRN: [id number]  CC:  Chief Complaint  Patient presents with   Establish Care   HPI Lisa [name] presents to establish care.  She is an 88 year old woman with a past medical history significant for COPD, adenocarcinoma of the lung s/p radiation completed 10/2021, CKD stage IV, chronic hepatitis C, HTN, MGUS, CAD, former tobacco use, iron deficiency, and multinodular goiter.  She was most recently followed by Dr. Legrand Rams.  Lisa [name] is also followed by nephrology (Dr. Theador Hawthorne) and oncology (Dr. Delton Coombes).  She reports feeling well today.  Her acute concern is shaking in her hands that has been present for several months.  She is unable to write her name because of uncontrollable shaking and often drops objects.  She is accompanied by her brother today.  Acute concerns, chronic medical conditions, and outstanding preventative care items discussed today are individually addressed A/P below.  Outpatient Encounter Medications as of 09/04/2022  Medication Sig   aspirin 81 MG EC tablet Take 81 mg by mouth in the morning.   calcium carbonate (OS-CAL - DOSED IN MG OF ELEMENTAL CALCIUM) 1250 (500 Ca) MG tablet Take 1 tablet by mouth daily with breakfast.   carvedilol (COREG) 25 MG tablet Take 1 tablet (25 mg total) by mouth 2 (two) times daily with a meal.   Cholecalciferol (VITAMIN D3) 125 MCG (5000 UT) CAPS Take 5,000 Units by mouth daily.   feeding supplement (ENSURE ENLIVE / ENSURE PLUS) LIQD Take 237 mLs by mouth 2 (two) times daily between meals.   furosemide (LASIX) 20 MG tablet Take 20 mg by mouth 2 (two) times daily. Morning & afternoon   guaiFENesin-dextromethorphan (ROBITUSSIN DM) 100-10 MG/5ML syrup Take 10 mLs by mouth every 4 (four) hours as needed for cough.   Ipratropium-Albuterol (COMBIVENT RESPIMAT) 20-100 MCG/ACT AERS respimat Inhale 1 puff into the lungs every 6 (six)  hours as needed for wheezing or shortness of breath.   ipratropium-albuterol (DUONEB) 0.5-2.5 (3) MG/3ML SOLN Take 3 mLs by nebulization every 4 (four) hours as needed (wheezing/shortness of breath).   Iron, Ferrous Sulfate, 325 (65 Fe) MG TABS Take by mouth.   isosorbide mononitrate (IMDUR) 30 MG 24 hr tablet Take 1 tablet (30 mg total) by mouth daily.   nitroGLYCERIN (NITROSTAT) 0.4 MG SL tablet Place 1 tablet (0.4 mg total) under the tongue every 5 (five) minutes as needed for chest pain.   ondansetron (ZOFRAN) 4 MG tablet Take 1 tablet (4 mg total) by mouth every 6 (six) hours as needed for nausea.   telmisartan (MICARDIS) 20 MG tablet Take 0.5 tablets by mouth daily.   [DISCONTINUED] amLODipine (NORVASC) 10 MG tablet Take 1 tablet (10 mg total) by mouth daily.   [DISCONTINUED] feeding supplement, ENSURE COMPLETE, (ENSURE COMPLETE) LIQD Take 237 mLs by mouth 2 (two) times daily between meals.   [DISCONTINUED] Ipratropium-Albuterol (COMBIVENT) 20-100 MCG/ACT AERS respimat Inhale 1 puff into the lungs every 6 (six) hours as needed for wheezing or shortness of breath.    [DISCONTINUED] megestrol (MEGACE) 20 MG tablet Take 20 mg by mouth daily.   [DISCONTINUED] pantoprazole (PROTONIX) 40 MG tablet Take 1 tablet (40 mg total) by mouth daily for 7 days.   No facility-administered encounter medications on file as of 09/04/2022.    Past Medical History:  Diagnosis Date   Anemia    Carotid artery stenosis  CKD (chronic kidney disease)    COPD (chronic obstructive pulmonary disease) (HCC)    Dyspnea    Due to COPD per patient   Goiter diffuse, nontoxic    Per patient   Hepatitis C    History of radiation therapy    Left lung-10/10/21-10/17/21- Dr. Gery Pray   Hypertension    Monoclonal gammopathy of unknown significance (MGUS)    Pyelonephritis 04/06/2014   UTI    Past Surgical History:  Procedure Laterality Date   ABDOMINAL HYSTERECTOMY     CHOLECYSTECTOMY N/A 09/17/2018    Procedure: LAPAROSCOPIC CHOLECYSTECTOMY;  Surgeon: Virl Cagey, MD;  Location: [facility];  Service: General;  Laterality: N/A;   FUDUCIAL PLACEMENT Left 09/01/2021   Procedure: PLACEMENT OF FIDUCIAL MARKERS TIMES FOUR;  Surgeon: Melrose Nakayama, MD;  Location: [place];  Service: Thoracic;  Laterality: Left;   TEE WITHOUT CARDIOVERSION N/A 04/10/2014   Procedure: TRANSESOPHAGEAL ECHOCARDIOGRAM (TEE);  Surgeon: [name], MD;  Location: [facility] ENDOSCOPY;  Service: Cardiovascular;  Laterality: N/A;   VIDEO BRONCHOSCOPY WITH ENDOBRONCHIAL NAVIGATION N/A 09/01/2021   Procedure: VIDEO BRONCHOSCOPY WITH ENDOBRONCHIAL NAVIGATION;  Surgeon: Melrose Nakayama, MD;  Location: [facility] OR;  Service: Thoracic;  Laterality: N/A;    Family History  Problem Relation Age of Onset   Hypertension Mother    Thyroid disease Mother    Hypertension Father    Thyroid disease Sister    Thyroid disease Brother    Thyroid disease Sister     Social History   Socioeconomic History   Marital status: Widowed    Spouse name: Not on file   Number of children: Not on file   Years of education: Not on file   Highest education level: Not on file  Occupational History   Not on file  Tobacco Use   Smoking status: Every Day    Packs/day: .25    Types: Cigarettes   Smokeless tobacco: Never  Vaping Use   Vaping Use: Never used  Substance and Sexual Activity   Alcohol use: No   Drug use: No   Sexual activity: Not on file  Other Topics Concern   Not on file  Social History Narrative   Not on file   Social Determinants of Health   Financial Resource Strain: Medium Risk (09/21/2021)   Overall Financial Resource Strain (CARDIA)    Difficulty of Paying Living Expenses: Somewhat hard  Food Insecurity: Not on file  Transportation Needs: Not on file  Physical Activity: Inactive (09/21/2021)   Exercise Vital Sign    Days of Exercise per Week: 0 days    Minutes of Exercise per Session: 0 min  Stress: Not on file   Social Connections: Not on file  Intimate Partner Violence: Not on file    Review of Systems  Constitutional:  Negative for chills and fever.  HENT:  Negative for sore throat.   Respiratory:  Negative for cough and shortness of breath.   Cardiovascular:  Negative for chest pain, palpitations and leg swelling.  Gastrointestinal:  Negative for abdominal pain, blood in stool, constipation, diarrhea, nausea and vomiting.  Genitourinary:  Negative for dysuria and hematuria.  Musculoskeletal:  Negative for myalgias.  Skin:  Negative for itching and rash.  Neurological:  Positive for tremors (Both hands). Negative for dizziness and headaches.  Psychiatric/Behavioral:  Negative for depression and suicidal ideas.         Objective    BP (!) 98/49   Pulse 73   Ht 5\' 5"  (  1.651 m)   Wt 105 lb (47.6 kg)   SpO2 90%   BMI 17.47 kg/m   Physical Exam Vitals reviewed.  Constitutional:      General: She is not in acute distress.    Appearance: Normal appearance. She is not toxic-appearing.     Comments: Examined in wheelchair  HENT:     Head: Normocephalic and atraumatic.     Right Ear: External ear normal.     Left Ear: External ear normal.     Nose: Nose normal. No congestion or rhinorrhea.     Mouth/Throat:     Mouth: Mucous membranes are moist.     Pharynx: Oropharynx is clear. No oropharyngeal exudate or posterior oropharyngeal erythema.  Eyes:     General: No scleral icterus.    Extraocular Movements: Extraocular movements intact.     Conjunctiva/sclera: Conjunctivae normal.     Pupils: Pupils are equal, round, and reactive to light.  Neck:     Comments: Goiter with a large, palpable thyroid nodule Cardiovascular:     Rate and Rhythm: Normal rate and regular rhythm.     Pulses: Normal pulses.     Heart sounds: Normal heart sounds. No murmur heard.    No friction rub. No gallop.  Pulmonary:     Effort: Pulmonary effort is normal.     Breath sounds: Normal breath sounds.  No wheezing, rhonchi or rales.  Abdominal:     General: Abdomen is flat. Bowel sounds are normal. There is no distension.     Palpations: Abdomen is soft.     Tenderness: There is no abdominal tenderness.  Musculoskeletal:        General: No swelling. Normal range of motion.     Cervical back: Normal range of motion.     Right lower leg: No edema.     Left lower leg: No edema.  Lymphadenopathy:     Cervical: No cervical adenopathy.  Skin:    General: Skin is warm and dry.     Capillary Refill: Capillary refill takes less than 2 seconds.     Coloration: Skin is not jaundiced.  Neurological:     General: No focal deficit present.     Mental Status: She is alert and oriented to person, place, and time.     Comments: Dysdiadochokinesia and dysmetria noted on exam  Psychiatric:        Mood and Affect: Mood normal.        Behavior: Behavior normal.   Last CBC Lab Results  Component Value Date   WBC 2.4 (L) 05/29/2022   HGB 9.6 (L) 05/29/2022   HCT 29.5 (L) 05/29/2022   MCV 91.9 05/29/2022   MCH 29.9 05/29/2022   RDW 12.9 05/29/2022   PLT 106 (L) [id number]   Last metabolic panel Lab Results  Component Value Date   GLUCOSE 92 05/29/2022   NA 138 05/29/2022   K 4.1 05/29/2022   CL 106 05/29/2022   CO2 28 05/29/2022   BUN 38 (H) 05/29/2022   CREATININE 1.47 (H) 05/29/2022   GFRNONAA 35 (L) 05/29/2022   CALCIUM 8.9 05/29/2022   PHOS 4.8 (H) 12/29/2021   PROT 8.4 (H) 05/29/2022   ALBUMIN 2.9 (L) 05/29/2022   LABGLOB 4.4 (H) 04/10/2022   AGRATIO 0.7 04/10/2022   BILITOT 0.5 05/29/2022   ALKPHOS 46 05/29/2022   AST 30 05/29/2022   ALT 20 05/29/2022   ANIONGAP 4 (L) 05/29/2022   Last lipids Lab Results  Component Value Date  CHOL 166 09/14/2018   HDL 59 09/14/2018   LDLCALC 87 09/14/2018   TRIG 102 09/14/2018   CHOLHDL 2.8 09/14/2018   Last thyroid functions Lab Results  Component Value Date   TSH 1.140 04/09/2014   Last vitamin B12 and Folate Lab Results   Component Value Date   [id number] 05/29/2022   FOLATE 17.0 05/29/2022    Assessment & Plan:   Problem List Items Addressed This Visit       Hypertension - Primary    Borderline hypotensive today.  She is currently prescribed telmisartan 10 mg daily, carvedilol 25 mg twice daily, Imdur 30 mg daily, and Lasix 20 mg daily.  Amlodipine was recently discontinued. -No medication changes for now.  Agree with discontinuing amlodipine.  Would consider reducing/discontinuing Imdur at future appointments if BP remains borderline low.      Non-ST elevation (NSTEMI) myocardial infarction [facility])    Previously documented history of CAD with NSTEMI.  Denies recent chest pain.  She is currently prescribed ASA 81 mg daily and carvedilol 25 mg twice daily. -No medication changes today      COPD (chronic obstructive pulmonary disease) (HCC)    Previously documented history of COPD.  She is asymptomatic currently.  She is prescribed Combivent and DuoNebs for as needed use every 6 hours. -No medication changes today      Adenocarcinoma of left lung ([place])    History of adenocarcinoma of the left lung s/p radiation treatment completed May 2023.  Followed by oncology (Dr. Delton Coombes).  Last seen for follow-up in January.  Repeat CT scan recommended for 6 months.      Chronic hepatitis C ([place])    Previously documented history of chronic HCV.  Evaluated by gastroenterology in March 2023 and deemed not a candidate for treatment in the absence of symptoms or evidence of cirrhosis.      Multinodular goiter    Previously documented history of multinodular goiter s/p biopsy of multiple thyroid nodules completed in September 2023.  Pathology was consistent with benign etiology. -Recent labs have been requested.  Update thyroid studies accordingly.      CKD (chronic kidney disease) stage 4, GFR 15-29 ml/min (HCC)    CKD 4.  Followed by nephrology (Dr. Theador Hawthorne).  Recently seen for follow-up.  Most recent  labs are stable. -No medication changes today      Smoldering multiple myeloma    Previously documented history of MGUS characterized as smoldering myeloma.  Followed by oncology (Dr. Delton Coombes).  Most recent labs are stable. -Oncology follow-up scheduled for June      Iron deficiency anemia    History of IDA.  She is currently on oral iron supplementation.  Followed by hematology/oncology (Dr. Delton Coombes).  Hgb stably low on recent labs. -No medication changes today      Tobacco abuse    She endorses a history of tobacco use, quit 1.5 years ago.      Dysmetria    Noted on exam today.  She describes shaking in her hands, often dropping things, and an inability to write her name.  She displays dysmetria that is distinguishable from a fine, intention tremor.  States that her symptoms have been present for several months.  Suggestive of cerebellar pathology. -Records from her previous PCP have been requested today.  She states that she was last evaluated 1 month ago.  Consider imaging at follow-up in 6 weeks      Encounter for general adult medical examination with abnormal findings  Presenting today to establish care.  Available records and labs been reviewed. -Records from her previous PCP have been requested today -We will tentatively plan for follow-up in 6 weeks and address outstanding preventative care items at that time       Return in about 6 weeks (around 10/16/2022).   Johnette Abraham, MD

## 2022-09-04 NOTE — Assessment & Plan Note (Signed)
She endorses a history of tobacco use, quit 1.5 years ago.

## 2022-09-04 NOTE — Assessment & Plan Note (Signed)
Noted on exam today.  She describes shaking in her hands, often dropping things, and an inability to write her name.  She displays dysmetria that is distinguishable from a fine, intention tremor.  States that her symptoms have been present for several months.  Suggestive of cerebellar pathology. -Records from her previous PCP have been requested today.  She states that she was last evaluated 1 month ago.  Consider imaging at follow-up in 6 weeks

## 2022-09-04 NOTE — Assessment & Plan Note (Signed)
CKD 4.  Followed by nephrology (Dr. Theador Hawthorne).  Recently seen for follow-up.  Most recent labs are stable. -No medication changes today

## 2022-09-04 NOTE — Assessment & Plan Note (Signed)
Previously documented history of CAD with NSTEMI.  Denies recent chest pain.  She is currently prescribed ASA 81 mg daily and carvedilol 25 mg twice daily. -No medication changes today

## 2022-09-04 NOTE — Assessment & Plan Note (Signed)
History of IDA.  She is currently on oral iron supplementation.  Followed by hematology/oncology (Dr. Delton Coombes).  Hgb stably low on recent labs. -No medication changes today

## 2022-09-04 NOTE — Assessment & Plan Note (Signed)
Borderline hypotensive today.  She is currently prescribed telmisartan 10 mg daily, carvedilol 25 mg twice daily, Imdur 30 mg daily, and Lasix 20 mg daily.  Amlodipine was recently discontinued. -No medication changes for now.  Agree with discontinuing amlodipine.  Would consider reducing/discontinuing Imdur at future appointments if BP remains borderline low.

## 2022-09-04 NOTE — Patient Instructions (Signed)
It was a pleasure to see you today.  Thank you for giving [place] the opportunity to be involved in your care.  Below is a brief recap of your visit and next steps.  We will plan to see you again in 6 weeks.  Summary You have established care today We will request records from Dr. Legrand Rams and plan for follow up in 6 weeks

## 2022-09-04 NOTE — Assessment & Plan Note (Signed)
Presenting today to establish care.  Available records and labs been reviewed. -Records from her previous PCP have been requested today -We will tentatively plan for follow-up in 6 weeks and address outstanding preventative care items at that time

## 2022-09-13 ENCOUNTER — Telehealth: Payer: Self-pay | Admitting: Internal Medicine

## 2022-09-13 NOTE — Telephone Encounter (Signed)
Lisa [name], [phone]   Called in regards to pt needing a PA on Ipratropium-Albuterol (COMBIVENT RESPIMAT) 20-100 MCG/ACT AERS respimat. Can you please look into this?

## 2022-10-04 DIAGNOSIS — D72819 Decreased white blood cell count, unspecified: Secondary | ICD-10-CM | POA: Diagnosis not present

## 2022-10-04 DIAGNOSIS — D696 Thrombocytopenia, unspecified: Secondary | ICD-10-CM | POA: Diagnosis not present

## 2022-10-04 DIAGNOSIS — E875 Hyperkalemia: Secondary | ICD-10-CM | POA: Diagnosis not present

## 2022-10-04 DIAGNOSIS — N1832 Chronic kidney disease, stage 3b: Secondary | ICD-10-CM | POA: Diagnosis not present

## 2022-10-04 DIAGNOSIS — C9001 Multiple myeloma in remission: Secondary | ICD-10-CM | POA: Diagnosis not present

## 2022-10-09 DIAGNOSIS — N1832 Chronic kidney disease, stage 3b: Secondary | ICD-10-CM | POA: Diagnosis not present

## 2022-10-09 DIAGNOSIS — R809 Proteinuria, unspecified: Secondary | ICD-10-CM | POA: Diagnosis not present

## 2022-10-09 DIAGNOSIS — I129 Hypertensive chronic kidney disease with stage 1 through stage 4 chronic kidney disease, or unspecified chronic kidney disease: Secondary | ICD-10-CM | POA: Diagnosis not present

## 2022-10-16 ENCOUNTER — Ambulatory Visit (INDEPENDENT_AMBULATORY_CARE_PROVIDER_SITE_OTHER): Payer: Medicare Other | Admitting: Internal Medicine

## 2022-10-16 ENCOUNTER — Encounter: Payer: Self-pay | Admitting: Internal Medicine

## 2022-10-16 VITALS — BP 138/60 | HR 72 | Ht 67.0 in | Wt 104.2 lb

## 2022-10-16 DIAGNOSIS — I10 Essential (primary) hypertension: Secondary | ICD-10-CM | POA: Diagnosis not present

## 2022-10-16 DIAGNOSIS — R278 Other lack of coordination: Secondary | ICD-10-CM | POA: Diagnosis not present

## 2022-10-16 DIAGNOSIS — Z23 Encounter for immunization: Secondary | ICD-10-CM | POA: Diagnosis not present

## 2022-10-16 DIAGNOSIS — R5381 Other malaise: Secondary | ICD-10-CM | POA: Diagnosis not present

## 2022-10-16 NOTE — Progress Notes (Signed)
Established Patient Office Visit  Subjective   Patient ID: Lisa [name], female    DOB: May 08, 1935  Age: 88 y.o. MRN: [id number]  Chief Complaint  Patient presents with   Hypertension    Follow up   [name] returns to care today for follow-up.  She was last evaluated by me on 3/25 as a new patient presenting to establish care.  At that time she displayed dysmetria and dysdiadochokinesia on exam.  Her blood pressure was also borderline hypotensive.  6-week follow-up was arranged.  There have been no acute interval events. [name] reports feeling fairly well today.  She continues to endorse tremoring in her hands but is otherwise asymptomatic and has no additional concerns to discuss.  Past Medical History:  Diagnosis Date   Anemia    Carotid artery stenosis    CKD (chronic kidney disease)    COPD (chronic obstructive pulmonary disease) (HCC)    Dyspnea    Due to COPD per patient   Goiter diffuse, nontoxic    Per patient   Hepatitis C    History of radiation therapy    Left lung-10/10/21-10/17/21- Dr. Antony Blackbird   Hypertension    Monoclonal gammopathy of unknown significance (MGUS)    Pyelonephritis 04/06/2014   UTI   Past Surgical History:  Procedure Laterality Date   ABDOMINAL HYSTERECTOMY     CHOLECYSTECTOMY N/A 09/17/2018   Procedure: LAPAROSCOPIC CHOLECYSTECTOMY;  Surgeon: Lucretia Roers, MD;  Location: [facility];  Service: General;  Laterality: N/A;   FUDUCIAL PLACEMENT Left 09/01/2021   Procedure: PLACEMENT OF FIDUCIAL MARKERS TIMES FOUR;  Surgeon: Loreli Slot, MD;  Location: [facility];  Service: Thoracic;  Laterality: Left;   TEE WITHOUT CARDIOVERSION N/A 04/10/2014   Procedure: TRANSESOPHAGEAL ECHOCARDIOGRAM (TEE);  Surgeon: Donato Schultz, MD;  Location: [facility]COPY;  Service: Cardiovascular;  Laterality: N/A;   VIDEO BRONCHOSCOPY WITH ENDOBRONCHIAL NAVIGATION N/A 09/01/2021   Procedure: VIDEO BRONCHOSCOPY WITH ENDOBRONCHIAL NAVIGATION;  Surgeon:  Loreli Slot, MD;  Location: [facility] OR;  Service: Thoracic;  Laterality: N/A;   Social History   Tobacco Use   Smoking status: Every Day    Packs/day: .25    Types: Cigarettes   Smokeless tobacco: Never  Vaping Use   Vaping Use: Never used  Substance Use Topics   Alcohol use: No   Drug use: No   Family History  Problem Relation Age of Onset   Hypertension Mother    Thyroid disease Mother    Hypertension Father    Thyroid disease Sister    Thyroid disease Brother    Thyroid disease Sister    No Known Allergies  Review of Systems  Constitutional:  Negative for chills and fever.  HENT:  Negative for sore throat.   Respiratory:  Negative for cough and shortness of breath.   Cardiovascular:  Negative for chest pain, palpitations and leg swelling.  Gastrointestinal:  Negative for abdominal pain, blood in stool, constipation, diarrhea, nausea and vomiting.  Genitourinary:  Negative for dysuria and hematuria.  Musculoskeletal:  Negative for myalgias.  Skin:  Negative for itching and rash.  Neurological:  Positive for tremors. Negative for dizziness and headaches.  Psychiatric/Behavioral:  Negative for depression and suicidal ideas.      Objective:     BP 138/60   Pulse 72   Ht 5\' 7"  (1.702 m)   Wt 104 lb 3.2 oz (47.3 kg)   SpO2 96%   BMI 16.32 kg/m  BP Readings from  Last 3 Encounters:  10/16/22 138/60  09/04/22 (!) 98/49  06/14/22 134/60   Physical Exam Vitals reviewed.  Constitutional:      General: She is not in acute distress.    Appearance: Normal appearance. She is not toxic-appearing.     Comments: Examined in wheelchair  HENT:     Head: Normocephalic and atraumatic.     Right Ear: External ear normal.     Left Ear: External ear normal.     Nose: Nose normal. No congestion or rhinorrhea.     Mouth/Throat:     Mouth: Mucous membranes are moist.     Pharynx: Oropharynx is clear. No oropharyngeal exudate or posterior oropharyngeal erythema.  Eyes:      General: No scleral icterus.    Extraocular Movements: Extraocular movements intact.     Conjunctiva/sclera: Conjunctivae normal.     Pupils: Pupils are equal, round, and reactive to light.  Cardiovascular:     Rate and Rhythm: Normal rate and regular rhythm.     Pulses: Normal pulses.     Heart sounds: Normal heart sounds. No murmur heard.    No friction rub. No gallop.  Pulmonary:     Effort: Pulmonary effort is normal.     Breath sounds: Normal breath sounds. No wheezing, rhonchi or rales.  Abdominal:     General: Abdomen is flat. Bowel sounds are normal. There is no distension.     Palpations: Abdomen is soft.     Tenderness: There is no abdominal tenderness.  Musculoskeletal:        General: No swelling. Normal range of motion.     Cervical back: Normal range of motion.     Right lower leg: No edema.     Left lower leg: No edema.  Lymphadenopathy:     Cervical: No cervical adenopathy.  Skin:    General: Skin is warm and dry.     Capillary Refill: Capillary refill takes less than 2 seconds.     Coloration: Skin is not jaundiced.  Neurological:     Mental Status: She is alert and oriented to person, place, and time.     Coordination: Coordination abnormal.     Comments: She again displays dysmetria and dysdiadochokinesia   Psychiatric:        Mood and Affect: Mood normal.        Behavior: Behavior normal.   Last CBC Lab Results  Component Value Date   WBC 2.4 (L) 05/29/2022   HGB 9.6 (L) 05/29/2022   HCT 29.5 (L) 05/29/2022   MCV 91.9 05/29/2022   MCH 29.9 05/29/2022   RDW 12.9 05/29/2022   PLT 106 (L) 05/29/2022   Last metabolic panel Lab Results  Component Value Date   GLUCOSE 92 05/29/2022   NA 138 05/29/2022   K 4.1 05/29/2022   CL 106 05/29/2022   CO2 28 05/29/2022   BUN 38 (H) 05/29/2022   CREATININE 1.47 (H) 05/29/2022   GFRNONAA 35 (L) 05/29/2022   CALCIUM 8.9 05/29/2022   PHOS 4.8 (H) 12/29/2021   PROT 8.4 (H) 05/29/2022   ALBUMIN 2.9 (L)  05/29/2022   LABGLOB 4.4 (H) 04/10/2022   AGRATIO 0.7 04/10/2022   BILITOT 0.5 05/29/2022   ALKPHOS 46 05/29/2022   AST 30 05/29/2022   ALT 20 05/29/2022   ANIONGAP 4 (L) 05/29/2022   Last lipids Lab Results  Component Value Date   CHOL 166 09/14/2018   HDL 59 09/14/2018   LDLCALC 87 09/14/2018   TRIG  102 09/14/2018   CHOLHDL 2.8 09/14/2018   Last thyroid functions Lab Results  Component Value Date   TSH 1.140 04/09/2014   Last vitamin B12 and Folate Lab Results  Component Value Date   VITAMINB12 273 05/29/2022   FOLATE 17.0 05/29/2022     Assessment & Plan:   Problem List Items Addressed This Visit       Hypertension    Presenting today for HTN check.  Manual BP today is 138/60.  She is currently prescribed telmisartan 10 mg daily, carvedilol 25 mg twice daily, Imdur 30 mg daily, and Lasix 20 mg daily. -No medication changes today.  Continue current antihypertensive regimen.      Dysmetria - Primary    She again displays dysmetria and dysdiadochokinesia on exam, concerning for cerebellar pathology.  She states that her symptoms have been present for several months. -Neurology referral placed today for further evaluation      Need for pneumococcal 20-valent conjugate vaccination    PCV 20 administered today      Return in about 3 months (around 01/16/2023).   [name], MD

## 2022-10-16 NOTE — Patient Instructions (Signed)
It was a pleasure to see you today.  Thank you for giving [place] the opportunity to be involved in your care.  Below is a brief recap of your visit and next steps.  We will plan to see you again in 3 months.  Summary Neurology referral placed today PCV-20 administered We will follow up in 3 months

## 2022-10-16 NOTE — Assessment & Plan Note (Signed)
She again displays dysmetria and dysdiadochokinesia on exam, concerning for cerebellar pathology.  She states that her symptoms have been present for several months. -Neurology referral placed today for further evaluation

## 2022-10-16 NOTE — Assessment & Plan Note (Signed)
Presenting today for HTN check.  Manual BP today is 138/60.  She is currently prescribed telmisartan 10 mg daily, carvedilol 25 mg twice daily, Imdur 30 mg daily, and Lasix 20 mg daily. -No medication changes today.  Continue current antihypertensive regimen.

## 2022-10-16 NOTE — Assessment & Plan Note (Signed)
PCV 20 administered today 

## 2022-10-17 ENCOUNTER — Telehealth: Payer: Self-pay | Admitting: *Deleted

## 2022-10-17 NOTE — Progress Notes (Signed)
  Care Coordination   Note   10/17/2022 Name: [name] MRN: [id number] DOB: 05/01/1935  Lisa [name] is a 88 y.o. year old female who sees Durwin Nora, Lucina Mellow, MD for primary care. I reached out to Janan Ridge by phone today to offer care coordination services.  [name] was given information about Care Coordination services today including:   The Care Coordination services include support from the care team which includes your Nurse Coordinator, Clinical Social Worker, or Pharmacist.  The Care Coordination team is here to help remove barriers to the health concerns and goals most important to you. Care Coordination services are voluntary, and the patient may decline or stop services at any time by request to their care team member.   Care Coordination Consent Status: Patient niece Lisa [name] DPR on file Patient agreed to services and verbal consent obtained.   Follow up plan:  Telephone appointment with care coordination team member scheduled for:  10/23/22  Encounter Outcome:  Pt. Scheduled  [facility] Coordination Care Guide  Direct Dial: [phone]

## 2022-10-23 ENCOUNTER — Ambulatory Visit: Payer: Self-pay | Admitting: *Deleted

## 2022-10-24 ENCOUNTER — Encounter: Payer: Self-pay | Admitting: *Deleted

## 2022-10-24 NOTE — Patient Outreach (Signed)
Care Coordination   Initial Visit Note   10/24/2022 - Late Entry  Name: Lisa [name] MRN: [id number] DOB: 1934-12-23  Lisa [name] is a 89 y.o. year old female who sees Durwin Nora, Lucina Mellow, MD for primary care. I spoke with patient's niece, [name] by phone today.  What matters to the patients health and wellness today?  Find Help in My Community.   Goals Addressed             This Visit's Progress    Find Help in My Community.   On track    Care Coordination Interventions:  Interventions Today    Flowsheet Row Most Recent Value  Chronic Disease   Chronic disease during today's visit Chronic Obstructive Pulmonary Disease (COPD), Chronic Kidney Disease/End Stage Renal Disease (ESRD), Hypertension (HTN), Other  [Tobacco Abuse]  General Interventions   General Interventions Discussed/Reviewed --  [Encouraged]  Labs Hgb A1c annually  [Encouraged]  Vaccines COVID-19, Flu, Pneumonia, RSV, Shingles, Tetanus/Pertussis/Diphtheria  [Encouraged]  Doctor Visits Discussed/Reviewed Doctor Visits Discussed, Doctor Visits Reviewed, Annual Wellness Visits, PCP, Specialist  [Encouraged]  Health Screening Bone Density, Colonoscopy, Mammogram  [Encouraged]  PCP/Specialist Visits Compliance with follow-up visit  [Encouraged]  Communication with PCP/Specialists  [Encouraged]  Level of Care Adult Daycare, Personal Care Services, Applications, Assisted Living, Skilled Nursing Facility  [Encouraged]  Applications Medicaid, Personal Care Services, FL-2  [Encouraged]  Exercise Interventions   Exercise Discussed/Reviewed Exercise Discussed, Assistive device use and maintanence, Exercise Reviewed, Physical Activity, Weight Managment  [Encouraged]  Physical Activity Discussed/Reviewed Physical Activity Discussed, Physical Activity Reviewed, Home Exercise Program (HEP), Types of exercise  [Encouraged]  Weight Management Weight maintenance  [Encouraged]  Education Interventions   Education  Provided Provided Therapist, sports, Provided Web-based Education, Provided Education  Provided Verbal Education On Nutrition, Mental Health/Coping with Illness, When to see the doctor, Foot Care, Eye Care, Applications, Labs, Exercise, Medication, Blood Sugar Monitoring, Programmer, applications, Nurse, mental health, Personal Care Services, FL-2  [Encouraged]  Mental Health Interventions   Mental Health Discussed/Reviewed Mental Health Discussed, Anxiety, Depression, Grief and Loss, Mental Health Reviewed, Substance Abuse, Coping Strategies, Crisis, Other, Suicide  [Domestic Violence]  Nutrition Interventions   Nutrition Discussed/Reviewed Nutrition Discussed, Adding fruits and vegetables, Increaing proteins, Decreasing fats, Decreasing salt, Supplmental nutrition, Decreasing sugar intake, Portion sizes, Fluid intake, Nutrition Reviewed, Carbohydrate meal planning  [Encouraged]  Pharmacy Interventions   Pharmacy Dicussed/Reviewed Pharmacy Topics Discussed, Medications and their functions, Medication Adherence, Pharmacy Topics Reviewed, Affording Medications  [Encouraged]  Safety Interventions   Safety Discussed/Reviewed Safety Discussed, Safety Reviewed, Home Safety  [Encouraged]  Home Safety Assistive Devices, Need for home safety assessment, Refer for community resources  [Encouraged]  Advanced Directive Interventions   Advanced Directives Discussed/Reviewed Advanced Directives Discussed  [Advanced Directives Completed]     Assessed Social Determinant of Health Barriers. Discussed Plans for Ongoing Care Management Follow Up. Provided Careers information officer Information for Care Management Team Members. Screened for Signs & Symptoms of Depression, Related to Chronic Disease State.  PHQ2 & PHQ9 Depression Screen Completed & Results Reviewed.  Suicidal Ideation & Homicidal Ideation Assessed - None Present.   Domestic Violence Assessed - None Present. Access to Weapons  Assessed - None Present.   Active Listening & Reflection Utilized.  Verbalization of Feelings Encouraged.  Emotional Support Provided. Feelings of Caregiver Burnout Validated. Caregiver Stress Acknowledged. Caregiver Resources Reviewed. Caregiver Support Groups Mailed. Self-Enrollment in Caregiver Support Group of Interest Emphasized. Crisis Support Information, Agencies, Services &  Resources Discussed. Problem Solving Interventions Identified. Task-Centered Solutions Implemented.   Solution-Focused Strategies Developed. Acceptance & Commitment Therapy Introduced. Brief Cognitive Behavioral Therapy Initiated. Client-Centered Therapy Enacted. Reviewed Prescription Medications & Discussed Importance of Compliance. Quality of Sleep Assessed & Sleep Hygiene Techniques Promoted. CSW Collaboration with Niece, [name] to Emphasize Importance of Patient Quitting Smoking & Offered Smoking Cessation Classes, Services, Agencies & Resources. CSW Collaboration with Niece, [name] to Discuss Higher Level of Care Options (I.e. Assisted Living, Extended Care, Rest Home, Family Care Home, Etc.) & Encouraged Consideration.  ~ Patient is Adamant About Remaining in Her Own Home to Live Independently. CSW Collaboration with Niece, [name] to Verify No In-Home Care Services, [employer], Warden/ranger, Etc., Covered Under Firefighter through [employer].  CSW Collaboration with Niece, [name] to Review Insurance Provider Benefits through [employer] & Encouraged Completion of Medicaid Application. 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 CSW Collaboration with Niece, [name] to Verify No Long-Term Care Insurance Benefits, [employer], Plans, Coverage, Etc.  CSW  Collaboration with Niece, [name] to Confirm Patient, Nor Deceased Husband Were Veterans, Making Her Ineligible to Apply for Aid & Attendance Benefits, Through [employer]. CSW Collaboration with Niece, [name] to Request Review of The Following List of [employer], [employer] in [place], Emailed on 10/23/2022: ~ Meals on Wheels, through Aging, Disability & Transit Services (ADTS) of     [place] ~ [facility], through Aging, Disability &     Transit Services (ADTS) of [place] ~ [employer] Family Estate agent, through Aging, Disability &     Transit Services (ADTS) of [employer] [place] ~ Adult Day Care Programs  ~ Adult Day Care Centers ~ In-Home Care & Respite Agencies ~ Home Health Care Agencies ~ Respite Care Agencies & Facilities CSW Collaboration with Niece, [name] to Control and instrumentation engineer with [employer], Wellsite geologist of Interest in [place], in An Effort to Obtain Care & Supervision for Patient in The Home.   ~ Patient is Not Interested in Receiving Caregiver Services in The Home. CSW Collaboration with Niece, [name] to [employer] with CSW [phone]# [phone]), if She Has Questions, Needs Assistance, or If Additional Social Work Needs Are Identified Between Now & Our Next Scheduled Follow-Up Outreach Call.         SDOH assessments and interventions completed:  Yes.  SDOH Interventions Today    Flowsheet Row Most Recent Value  SDOH Interventions   Food Insecurity Interventions Intervention Not Indicated  [Verified by Niece, Bronson Ing Moore]  Housing Interventions Intervention Not Indicated  [Verified by Niece, Bronson Ing Moore]  Transportation Interventions Intervention Not Indicated, Patient Resources (Friends/Family), Payor Benefit  [Verified by Niece, [employer] Moore]  Utilities Interventions Intervention Not Indicated   [Verified by Niece, [employer]onson [employer] Moore]  Alcohol Usage Interventions Intervention Not Indicated (Score <7)  [Verified by Niece, Yvette Moore]  Financial Strain Interventions Intervention Not Indicated  [Verified by Niece, [employer]onson [employer] Moore]  Physical Activity Interventions Intervention Not Indicated, Patient Declined  [Verified by Niece, Bronson [employer] Moore]  Stress Interventions Intervention Not Indicated  [Verified by Niece, Bronson Ing Moore]  Social Connections Interventions Intervention Not Indicated  [Verified by Niece, Bronson Ing Moore]     Care Coordination Interventions:  Yes, provided.   Follow up plan: Follow up call scheduled for 10/30/2022 at 2:45 pm.   Encounter Outcome:  Pt. Visit Completed.   Danford Bad, BSW, MSW, LCSW  Licensed Restaurant manager, fast food [facility]  Mailing [place] [address], [place], [place] [address] Physical Address[address] [place], [place] [address] Toll Free Main # [phone] Fax # [phone] Cell # [phone] [email or web address]

## 2022-10-24 NOTE — Patient Instructions (Signed)
Visit Information  Thank you for taking time to visit with me today. Please don't hesitate to contact me if I can be of assistance to you.   Following are the goals we discussed today:   Goals Addressed             This Visit's Progress    Find Help in My Community.   On track    Care Coordination Interventions:  Interventions Today    Flowsheet Row Most Recent Value  Chronic Disease   Chronic disease during today's visit Chronic Obstructive Pulmonary Disease (COPD), Chronic Kidney Disease/End Stage Renal Disease (ESRD), Hypertension (HTN), Other  [Tobacco Abuse]  General Interventions   General Interventions Discussed/Reviewed --  [Encouraged]  Labs Hgb A1c annually  [Encouraged]  Vaccines COVID-19, Flu, Pneumonia, RSV, Shingles, Tetanus/Pertussis/Diphtheria  [Encouraged]  Doctor Visits Discussed/Reviewed Doctor Visits Discussed, Doctor Visits Reviewed, Annual Wellness Visits, PCP, Specialist  [Encouraged]  Health Screening Bone Density, Colonoscopy, Mammogram  [Encouraged]  PCP/Specialist Visits Compliance with follow-up visit  [Encouraged]  Communication with PCP/Specialists  [Encouraged]  Level of Care Adult Daycare, Personal Care Services, Applications, Assisted Living, Skilled Nursing Facility  [Encouraged]  Applications Medicaid, Personal Care Services, FL-2  [Encouraged]  Exercise Interventions   Exercise Discussed/Reviewed Exercise Discussed, Assistive device use and maintanence, Exercise Reviewed, Physical Activity, Weight Managment  [Encouraged]  Physical Activity Discussed/Reviewed Physical Activity Discussed, Physical Activity Reviewed, Home Exercise Program (HEP), Types of exercise  [Encouraged]  Weight Management Weight maintenance  [Encouraged]  Education Interventions   Education Provided Provided Therapist, sports, Provided Web-based Education, Provided Education  Provided Verbal Education On Nutrition, Mental Health/Coping with Illness, When to see the doctor,  Foot Care, Eye Care, Applications, Labs, Exercise, Medication, Blood Sugar Monitoring, Programmer, applications, Nurse, mental health, Personal Care Services, FL-2  [Encouraged]  Mental Health Interventions   Mental Health Discussed/Reviewed Mental Health Discussed, Anxiety, Depression, Grief and Loss, Mental Health Reviewed, Substance Abuse, Coping Strategies, Crisis, Other, Suicide  [Domestic Violence]  Nutrition Interventions   Nutrition Discussed/Reviewed Nutrition Discussed, Adding fruits and vegetables, Increaing proteins, Decreasing fats, Decreasing salt, Supplmental nutrition, Decreasing sugar intake, Portion sizes, Fluid intake, Nutrition Reviewed, Carbohydrate meal planning  [Encouraged]  Pharmacy Interventions   Pharmacy Dicussed/Reviewed Pharmacy Topics Discussed, Medications and their functions, Medication Adherence, Pharmacy Topics Reviewed, Affording Medications  [Encouraged]  Safety Interventions   Safety Discussed/Reviewed Safety Discussed, Safety Reviewed, Home Safety  [Encouraged]  Home Safety Assistive Devices, Need for home safety assessment, Refer for community resources  [Encouraged]  Advanced Directive Interventions   Advanced Directives Discussed/Reviewed Advanced Directives Discussed  [Advanced Directives Completed]     Assessed Social Determinant of Health Barriers. Discussed Plans for Ongoing Care Management Follow Up. Provided Careers information officer Information for Care Management Team Members. Screened for Signs & Symptoms of Depression, Related to Chronic Disease State.  PHQ2 & PHQ9 Depression Screen Completed & Results Reviewed.  Suicidal Ideation & Homicidal Ideation Assessed - None Present.   Domestic Violence Assessed - None Present. Access to Weapons Assessed - None Present.   Active Listening & Reflection Utilized.  Verbalization of Feelings Encouraged.  Emotional Support Provided. Feelings of Caregiver Burnout Validated. Caregiver  Stress Acknowledged. Caregiver Resources Reviewed. Caregiver Support Groups Mailed. Self-Enrollment in Caregiver Support Group of Interest Emphasized. Crisis Support Information, Agencies, Services & Resources Discussed. Problem Solving Interventions Identified. Task-Centered Solutions Implemented.   Solution-Focused Strategies Developed. Acceptance & Commitment Therapy Introduced. Brief Cognitive Behavioral Therapy Initiated. Client-Centered Therapy Enacted. Reviewed Prescription Medications & Discussed Importance  of Compliance. Quality of Sleep Assessed & Sleep Hygiene Techniques Promoted. CSW Collaboration with Niece, [name] to Emphasize Importance of Patient Quitting Smoking & Offered Smoking Cessation Classes, Services, Agencies & Resources. CSW Collaboration with Niece, [name] to Discuss Higher Level of Care Options (I.e. Assisted Living, Extended Care, Rest Home, Family Care Home, Etc.) & Encouraged Consideration.  ~ Patient is Adamant About Remaining in Her Own Home to Live Independently. CSW Collaboration with Niece, [name] to Verify No In-Home Care Services, [employer], Warden/ranger, Etc., Covered Under Firefighter through [employer].  CSW Collaboration with Niece, [name] to Review Insurance Provider Benefits through [employer] & Encouraged Completion of Medicaid Application. 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 CSW Collaboration with Niece, [name] to Verify No Long-Term Care Insurance Benefits, [employer], Plans, Coverage, Etc.  CSW Collaboration with Niece, [name] to Confirm Patient, Nor Deceased Husband Were Veterans, Making Her Ineligible to Apply for Aid & Attendance Benefits, Through CIGNA. CSW  Collaboration with Niece, [name] to Request Review of The Following List of [employer], [employer] in [place], Emailed on 10/23/2022: ~ Meals on Wheels, through Aging, Disability & Transit Services (ADTS) of     [place] ~ [facility], through Aging, Disability &     Transit Services (ADTS) of [place] ~ [employer] Family Estate agent, through Aging, Disability &     Transit Services (ADTS) of [employer] [place] ~ Adult Day Care Programs  ~ Adult Day Care Centers ~ In-Home Care & Respite Agencies ~ Home Health Care Agencies ~ Respite Care Agencies & Facilities CSW Collaboration with Niece, [name] to Control and instrumentation engineer with [employer], Wellsite geologist of Interest in [place], in An Effort to Obtain Care & Supervision for Patient in The Home.   ~ Patient is Not Interested in Receiving Caregiver Services in The Home. CSW Collaboration with Niece, [name] to [employer] with CSW [phone]# [phone]), if She Has Questions, Needs Assistance, or If Additional Social Work Needs Are Identified Between Now & Our Next Scheduled Follow-Up Outreach Call.      Our next appointment is by telephone on 10/30/2022 at 2:45 pm.   Please call the care guide team at [phone] if you need to cancel or reschedule your appointment.   If you are experiencing a Mental Health or Behavioral Health Crisis or need someone to talk to, please call the Suicide and Crisis Lifeline: 988 call the [place] National Suicide Prevention Lifeline: 503-434-6010 or TTY: 937 512 6534 TTY 418-081-8356) to talk to a trained counselor call 1-800-273-TALK (toll free, 24 hour hotline) go to [facility] [address], [place] [phone]) call the [facility] Crisis Line: 5754740432 call 911  Patient verbalizes understanding of  instructions and care plan provided today and agrees to view in MyChart. Active MyChart status and patient understanding of how to access instructions and care plan via MyChart confirmed with patient.     Telephone follow up appointment with care management team member scheduled for:  10/30/2022 at 2:45 pm.   Danford Bad, BSW, MSW, LCSW  Licensed Clinical Social Worker  [employer] [facility]  Mailing [place]. [address] [place],  [place] [address] Physical Address[address], [place], [place] [address] Toll Free Main # [phone] Fax # [phone] Cell # [phone] [email or web address]

## 2022-10-30 ENCOUNTER — Encounter: Payer: Self-pay | Admitting: *Deleted

## 2022-10-30 ENCOUNTER — Ambulatory Visit: Payer: Self-pay | Admitting: *Deleted

## 2022-10-30 NOTE — Patient Outreach (Signed)
Care Coordination   Follow Up Visit Note   10/30/2022  Name: Lisa [name] MRN: [id number] DOB: 02/28/1935  Lisa [name] Lisa [name] is a 88 y.o. year old female who sees Durwin Nora, Lucina Mellow, MD for primary care. I spoke with niece, [name] by phone today.  What matters to the patients health and wellness today?  Find Help in My Community.   Goals Addressed             This Visit's Progress    Find Help in My Community.   On track    Care Coordination Interventions:  Interventions Today    Flowsheet Row Most Recent Value  Chronic Disease   Chronic disease during today's visit Chronic Obstructive Pulmonary Disease (COPD), Chronic Kidney Disease/End Stage Renal Disease (ESRD), Hypertension (HTN), Other  [Tobacco Abuse]  General Interventions   General Interventions Discussed/Reviewed --  [Encouraged]  Labs Hgb A1c annually  [Encouraged]  Vaccines COVID-19, Flu, Pneumonia, RSV, Shingles, Tetanus/Pertussis/Diphtheria  [Encouraged]  Doctor Visits Discussed/Reviewed Doctor Visits Discussed, Doctor Visits Reviewed, Annual Wellness Visits, PCP, Specialist  [Encouraged]  Health Screening Bone Density, Colonoscopy, Mammogram  [Encouraged]  PCP/Specialist Visits Compliance with follow-up visit  [Encouraged]  Communication with PCP/Specialists  [Encouraged]  Level of Care Adult Daycare, Personal Care Services, Applications, Assisted Living, Skilled Nursing Facility  [Encouraged]  Applications Medicaid, Personal Care Services, FL-2  [Encouraged]  Exercise Interventions   Exercise Discussed/Reviewed Exercise Discussed, Assistive device use and maintanence, Exercise Reviewed, Physical Activity, Weight Managment  [Encouraged]  Physical Activity Discussed/Reviewed Physical Activity Discussed, Physical Activity Reviewed, Home Exercise Program (HEP), Types of exercise  [Encouraged]  Weight Management Weight maintenance  [Encouraged]  Education Interventions   Education Provided Provided  Therapist, sports, Provided Web-based Education, Provided Education  Provided Verbal Education On Nutrition, Mental Health/Coping with Illness, When to see the doctor, Foot Care, Eye Care, Applications, Labs, Exercise, Medication, Blood Sugar Monitoring, Programmer, applications, Nurse, mental health, Personal Care Services, FL-2  [Encouraged]  Mental Health Interventions   Mental Health Discussed/Reviewed Mental Health Discussed, Anxiety, Depression, Grief and Loss, Mental Health Reviewed, Substance Abuse, Coping Strategies, Crisis, Other, Suicide  [Domestic Violence]  Nutrition Interventions   Nutrition Discussed/Reviewed Nutrition Discussed, Adding fruits and vegetables, Increaing proteins, Decreasing fats, Decreasing salt, Supplmental nutrition, Decreasing sugar intake, Portion sizes, Fluid intake, Nutrition Reviewed, Carbohydrate meal planning  [Encouraged]  Pharmacy Interventions   Pharmacy Dicussed/Reviewed Pharmacy Topics Discussed, Medications and their functions, Medication Adherence, Pharmacy Topics Reviewed, Affording Medications  [Encouraged]  Safety Interventions   Safety Discussed/Reviewed Safety Discussed, Safety Reviewed, Home Safety  [Encouraged]  Home Safety Assistive Devices, Need for home safety assessment, Refer for community resources  [Encouraged]  Advanced Directive Interventions   Advanced Directives Discussed/Reviewed Advanced Directives Discussed  [Advanced Directives Completed]     Active Listening & Reflection Utilized.  Verbalization of Feelings Encouraged.  Emotional Support Provided. Feelings of Caregiver Burnout Validated. Caregiver Stress Acknowledged. Caregiver Resources Reviewed. Caregiver Support Groups Discussed. Self-Enrollment in Caregiver Support Group of Interest Emphasized. Problem Solving Interventions Employed. Task-Centered Solutions Indicated.   Solution-Focused Strategies Implemented. Acceptance & Commitment  Therapy Performed. Brief Cognitive Behavioral Therapy Initiated. Client-Centered Therapy Implemented. CSW Collaboration with Niece, [name] to Review Smoking Cessation Classes, Services, Agencies & Resources & Emphasize Importance of Patient Quitting Smoking. CSW Collaboration with Niece, [name] to [employer] Receipt & Thoroughly Review the Following List of [employer], Services & Resources in [place]: ~ [employer], through Aging, Disability & Transit  Services (ADTS) of [facility] ~ Life Enrichment & [facility], through Aging, Disability & Transit Services (ADTS) of     [place] ~ [employer] Family Estate agent, through Aging, Disability & Transit Services (ADTS) of     [employer] [place] ~ Adult Day Care Programs  ~ Adult Day Care Centers ~ In-Home Care & Respite Agencies ~ Home Health Care Agencies ~ Respite Care Agencies & Facilities CSW Collaboration with Niece, [name] to Control and instrumentation engineer with [employer], Wellsite geologist of Interest in [place], in An Effort to Obtain Care & Supervision for Patient in The Home. CSW Collaboration with Niece, [name] to Confirm Name on Waiting List for Meals-on-Wheels, through (ADTS) Aging, Disability & Transit Services of [place] [phone]). CSW Collaboration with Niece, [name] to [employer] with CSW [phone]), if She Has Questions, Needs Assistance, or If Additional Social Work Needs Are Identified Between Now & Our Next Follow-Up Outreach Call, Scheduled on 11/13/2022 at 12:45 PM.      SDOH assessments and interventions completed:  Yes.  Care Coordination Interventions:  Yes, provided.   Follow up plan: Follow up call scheduled for 11/13/2022 at 12:45 pm.  Encounter Outcome:  Pt. Visit Completed.   Danford Bad, BSW, MSW, LCSW  Licensed Teacher, [facility]  Mailing [place] [address] [place], [place] [address] Physical Address-[address], [place], [place] [address] Toll Free Main # [phone] Fax # [phone] Cell # [phone] [email or web address]
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
## 2022-11-13 ENCOUNTER — Ambulatory Visit: Payer: Self-pay | Admitting: *Deleted

## 2022-11-13 ENCOUNTER — Encounter: Payer: Self-pay | Admitting: *Deleted
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
## 2022-11-15 ENCOUNTER — Emergency Department (HOSPITAL_COMMUNITY): Payer: Medicare Other

## 2022-11-15 ENCOUNTER — Inpatient Hospital Stay (HOSPITAL_COMMUNITY): Payer: Medicare Other

## 2022-11-15 ENCOUNTER — Other Ambulatory Visit: Payer: Self-pay

## 2022-11-15 ENCOUNTER — Inpatient Hospital Stay (HOSPITAL_COMMUNITY)
Admission: EM | Admit: 2022-11-15 | Discharge: 2022-11-21 | DRG: 280 | Disposition: A | Discharge status: Home Health | Payer: Medicare Other | Attending: Family Medicine | Admitting: Family Medicine

## 2022-11-15 ENCOUNTER — Encounter (HOSPITAL_COMMUNITY): Payer: Self-pay | Admitting: Emergency Medicine

## 2022-11-15 DIAGNOSIS — I2510 Atherosclerotic heart disease of native coronary artery without angina pectoris: Secondary | ICD-10-CM | POA: Diagnosis not present

## 2022-11-15 DIAGNOSIS — E43 Unspecified severe protein-calorie malnutrition: Secondary | ICD-10-CM | POA: Diagnosis not present

## 2022-11-15 DIAGNOSIS — I509 Heart failure, unspecified: Secondary | ICD-10-CM

## 2022-11-15 DIAGNOSIS — Z9049 Acquired absence of other specified parts of digestive tract: Secondary | ICD-10-CM

## 2022-11-15 DIAGNOSIS — N184 Chronic kidney disease, stage 4 (severe): Secondary | ICD-10-CM | POA: Diagnosis not present

## 2022-11-15 DIAGNOSIS — I471 Supraventricular tachycardia, unspecified: Secondary | ICD-10-CM | POA: Diagnosis not present

## 2022-11-15 DIAGNOSIS — R0603 Acute respiratory distress: Principal | ICD-10-CM | POA: Diagnosis present

## 2022-11-15 DIAGNOSIS — I5032 Chronic diastolic (congestive) heart failure: Secondary | ICD-10-CM

## 2022-11-15 DIAGNOSIS — J9621 Acute and chronic respiratory failure with hypoxia: Secondary | ICD-10-CM | POA: Diagnosis present

## 2022-11-15 DIAGNOSIS — C3492 Malignant neoplasm of unspecified part of left bronchus or lung: Secondary | ICD-10-CM | POA: Diagnosis not present

## 2022-11-15 DIAGNOSIS — I214 Non-ST elevation (NSTEMI) myocardial infarction: Secondary | ICD-10-CM | POA: Diagnosis not present

## 2022-11-15 DIAGNOSIS — I160 Hypertensive urgency: Secondary | ICD-10-CM | POA: Diagnosis not present

## 2022-11-15 DIAGNOSIS — D631 Anemia in chronic kidney disease: Secondary | ICD-10-CM | POA: Diagnosis present

## 2022-11-15 DIAGNOSIS — Z743 Need for continuous supervision: Secondary | ICD-10-CM | POA: Diagnosis not present

## 2022-11-15 DIAGNOSIS — Z7982 Long term (current) use of aspirin: Secondary | ICD-10-CM

## 2022-11-15 DIAGNOSIS — I700 Atherosclerosis of aorta: Secondary | ICD-10-CM | POA: Diagnosis not present

## 2022-11-15 DIAGNOSIS — I130 Hypertensive heart and chronic kidney disease with heart failure and stage 1 through stage 4 chronic kidney disease, or unspecified chronic kidney disease: Principal | ICD-10-CM | POA: Diagnosis present

## 2022-11-15 DIAGNOSIS — D472 Monoclonal gammopathy: Secondary | ICD-10-CM | POA: Diagnosis not present

## 2022-11-15 DIAGNOSIS — Z66 Do not resuscitate: Secondary | ICD-10-CM | POA: Diagnosis not present

## 2022-11-15 DIAGNOSIS — Z87440 Personal history of urinary (tract) infections: Secondary | ICD-10-CM

## 2022-11-15 DIAGNOSIS — N289 Disorder of kidney and ureter, unspecified: Secondary | ICD-10-CM | POA: Diagnosis not present

## 2022-11-15 DIAGNOSIS — J9690 Respiratory failure, unspecified, unspecified whether with hypoxia or hypercapnia: Secondary | ICD-10-CM | POA: Diagnosis not present

## 2022-11-15 DIAGNOSIS — Z90710 Acquired absence of both cervix and uterus: Secondary | ICD-10-CM

## 2022-11-15 DIAGNOSIS — C3412 Malignant neoplasm of upper lobe, left bronchus or lung: Secondary | ICD-10-CM | POA: Diagnosis present

## 2022-11-15 DIAGNOSIS — R6889 Other general symptoms and signs: Secondary | ICD-10-CM | POA: Diagnosis not present

## 2022-11-15 DIAGNOSIS — Z681 Body mass index (BMI) 19 or less, adult: Secondary | ICD-10-CM | POA: Diagnosis not present

## 2022-11-15 DIAGNOSIS — J441 Chronic obstructive pulmonary disease with (acute) exacerbation: Secondary | ICD-10-CM | POA: Diagnosis not present

## 2022-11-15 DIAGNOSIS — Z1152 Encounter for screening for COVID-19: Secondary | ICD-10-CM

## 2022-11-15 DIAGNOSIS — Z8249 Family history of ischemic heart disease and other diseases of the circulatory system: Secondary | ICD-10-CM

## 2022-11-15 DIAGNOSIS — I5033 Acute on chronic diastolic (congestive) heart failure: Secondary | ICD-10-CM | POA: Diagnosis not present

## 2022-11-15 DIAGNOSIS — J449 Chronic obstructive pulmonary disease, unspecified: Secondary | ICD-10-CM | POA: Diagnosis not present

## 2022-11-15 DIAGNOSIS — J9602 Acute respiratory failure with hypercapnia: Secondary | ICD-10-CM | POA: Diagnosis present

## 2022-11-15 DIAGNOSIS — R627 Adult failure to thrive: Secondary | ICD-10-CM | POA: Diagnosis not present

## 2022-11-15 DIAGNOSIS — G9341 Metabolic encephalopathy: Secondary | ICD-10-CM | POA: Diagnosis not present

## 2022-11-15 DIAGNOSIS — R636 Underweight: Secondary | ICD-10-CM | POA: Diagnosis present

## 2022-11-15 DIAGNOSIS — R64 Cachexia: Secondary | ICD-10-CM | POA: Diagnosis not present

## 2022-11-15 DIAGNOSIS — I499 Cardiac arrhythmia, unspecified: Secondary | ICD-10-CM | POA: Diagnosis not present

## 2022-11-15 DIAGNOSIS — B182 Chronic viral hepatitis C: Secondary | ICD-10-CM | POA: Diagnosis present

## 2022-11-15 DIAGNOSIS — Z79899 Other long term (current) drug therapy: Secondary | ICD-10-CM

## 2022-11-15 DIAGNOSIS — J9601 Acute respiratory failure with hypoxia: Secondary | ICD-10-CM | POA: Diagnosis not present

## 2022-11-15 DIAGNOSIS — Z923 Personal history of irradiation: Secondary | ICD-10-CM

## 2022-11-15 DIAGNOSIS — Z7722 Contact with and (suspected) exposure to environmental tobacco smoke (acute) (chronic): Secondary | ICD-10-CM | POA: Diagnosis present

## 2022-11-15 DIAGNOSIS — G9340 Encephalopathy, unspecified: Secondary | ICD-10-CM | POA: Diagnosis present

## 2022-11-15 DIAGNOSIS — Z87891 Personal history of nicotine dependence: Secondary | ICD-10-CM

## 2022-11-15 DIAGNOSIS — R918 Other nonspecific abnormal finding of lung field: Secondary | ICD-10-CM | POA: Diagnosis present

## 2022-11-15 DIAGNOSIS — R7989 Other specified abnormal findings of blood chemistry: Secondary | ICD-10-CM | POA: Diagnosis present

## 2022-11-15 DIAGNOSIS — F410 Panic disorder [episodic paroxysmal anxiety] without agoraphobia: Secondary | ICD-10-CM | POA: Diagnosis present

## 2022-11-15 DIAGNOSIS — R079 Chest pain, unspecified: Secondary | ICD-10-CM | POA: Diagnosis not present

## 2022-11-15 DIAGNOSIS — N179 Acute kidney failure, unspecified: Secondary | ICD-10-CM | POA: Diagnosis present

## 2022-11-15 DIAGNOSIS — R069 Unspecified abnormalities of breathing: Secondary | ICD-10-CM | POA: Diagnosis not present

## 2022-11-15 DIAGNOSIS — R54 Age-related physical debility: Secondary | ICD-10-CM | POA: Diagnosis present

## 2022-11-15 DIAGNOSIS — I272 Pulmonary hypertension, unspecified: Secondary | ICD-10-CM | POA: Diagnosis not present

## 2022-11-15 DIAGNOSIS — E049 Nontoxic goiter, unspecified: Secondary | ICD-10-CM | POA: Diagnosis present

## 2022-11-15 DIAGNOSIS — I10 Essential (primary) hypertension: Secondary | ICD-10-CM | POA: Diagnosis not present

## 2022-11-15 DIAGNOSIS — I5031 Acute diastolic (congestive) heart failure: Secondary | ICD-10-CM | POA: Diagnosis not present

## 2022-11-15 DIAGNOSIS — Z8639 Personal history of other endocrine, nutritional and metabolic disease: Secondary | ICD-10-CM

## 2022-11-15 DIAGNOSIS — I110 Hypertensive heart disease with heart failure: Secondary | ICD-10-CM | POA: Diagnosis not present

## 2022-11-15 DIAGNOSIS — Z7189 Other specified counseling: Secondary | ICD-10-CM | POA: Diagnosis not present

## 2022-11-15 DIAGNOSIS — Z515 Encounter for palliative care: Secondary | ICD-10-CM | POA: Diagnosis not present

## 2022-11-15 DIAGNOSIS — Z8349 Family history of other endocrine, nutritional and metabolic diseases: Secondary | ICD-10-CM

## 2022-11-15 DIAGNOSIS — R0602 Shortness of breath: Secondary | ICD-10-CM | POA: Diagnosis not present

## 2022-11-15 LAB — BLOOD GAS, ARTERIAL
Acid-Base Excess: 1.9 mmol/L (ref 0.0–2.0)
Bicarbonate: 26.6 mmol/L (ref 20.0–28.0)
Drawn by: 21310
FIO2: 30 %
O2 Saturation: 98.9 %
Patient temperature: 36.4
pCO2 arterial: 40 mmHg (ref 32–48)
pH, Arterial: 7.43 (ref 7.35–7.45)
pO2, Arterial: 140 mmHg — ABNORMAL HIGH (ref 83–108)

## 2022-11-15 LAB — CBC WITH DIFFERENTIAL/PLATELET
Abs Immature Granulocytes: 0.05 10*3/uL (ref 0.00–0.07)
Basophils Absolute: 0 10*3/uL (ref 0.0–0.1)
Basophils Relative: 0 %
Eosinophils Absolute: 0.1 10*3/uL (ref 0.0–0.5)
Eosinophils Relative: 1 %
HCT: 28.6 % — ABNORMAL LOW (ref 36.0–46.0)
Hemoglobin: 9.2 g/dL — ABNORMAL LOW (ref 12.0–15.0)
Immature Granulocytes: 1 %
Lymphocytes Relative: 23 %
Lymphs Abs: 1.1 10*3/uL (ref 0.7–4.0)
MCH: 31.3 pg (ref 26.0–34.0)
MCHC: 32.2 g/dL (ref 30.0–36.0)
MCV: 97.3 fL (ref 80.0–100.0)
Monocytes Absolute: 0.4 10*3/uL (ref 0.1–1.0)
Monocytes Relative: 8 %
Neutro Abs: 3.3 10*3/uL (ref 1.7–7.7)
Neutrophils Relative %: 67 %
Platelets: 91 10*3/uL — ABNORMAL LOW (ref 150–400)
RBC: 2.94 MIL/uL — ABNORMAL LOW (ref 3.87–5.11)
RDW: 13 % (ref 11.5–15.5)
WBC: 4.9 10*3/uL (ref 4.0–10.5)
nRBC: 0 % (ref 0.0–0.2)

## 2022-11-15 LAB — MRSA NEXT GEN BY PCR, NASAL: MRSA by PCR Next Gen: NOT DETECTED

## 2022-11-15 LAB — RESP PANEL BY RT-PCR (RSV, FLU A&B, COVID)  RVPGX2
Influenza A by PCR: NEGATIVE
Influenza B by PCR: NEGATIVE
Resp Syncytial Virus by PCR: NEGATIVE
SARS Coronavirus 2 by RT PCR: NEGATIVE

## 2022-11-15 LAB — LACTIC ACID, PLASMA
Lactic Acid, Venous: 1.4 mmol/L (ref 0.5–1.9)
Lactic Acid, Venous: 1.2 mmol/L (ref 0.5–1.9)

## 2022-11-15 LAB — COMPREHENSIVE METABOLIC PANEL
ALT: 23 U/L (ref 0–44)
AST: 35 U/L (ref 15–41)
Albumin: 3.1 g/dL — ABNORMAL LOW (ref 3.5–5.0)
Alkaline Phosphatase: 59 U/L (ref 38–126)
Anion gap: 7 (ref 5–15)
BUN: 65 mg/dL — ABNORMAL HIGH (ref 8–23)
CO2: 25 mmol/L (ref 22–32)
Calcium: 8.7 mg/dL — ABNORMAL LOW (ref 8.9–10.3)
Chloride: 103 mmol/L (ref 98–111)
Creatinine, Ser: 1.8 mg/dL — ABNORMAL HIGH (ref 0.44–1.00)
GFR, Estimated: 27 mL/min — ABNORMAL LOW (ref >60)
Glucose, Bld: 152 mg/dL — ABNORMAL HIGH (ref 70–99)
Potassium: 3.9 mmol/L (ref 3.5–5.1)
Sodium: 135 mmol/L (ref 135–145)
Total Bilirubin: 0.4 mg/dL (ref 0.3–1.2)
Total Protein: 9.7 g/dL — ABNORMAL HIGH (ref 6.5–8.1)

## 2022-11-15 LAB — BLOOD GAS, VENOUS
Acid-Base Excess: 0.7 mmol/L (ref 0.0–2.0)
Bicarbonate: 30 mmol/L — ABNORMAL HIGH (ref 20.0–28.0)
Drawn by: 2792
O2 Saturation: 87 %
Patient temperature: 36.5
pCO2, Ven: 68 mmHg — ABNORMAL HIGH (ref 44–60)
pH, Ven: 7.25 (ref 7.25–7.43)
pO2, Ven: 59 mmHg — ABNORMAL HIGH (ref 32–45)

## 2022-11-15 LAB — BRAIN NATRIURETIC PEPTIDE: B Natriuretic Peptide: 612 pg/mL — ABNORMAL HIGH (ref 0.0–100.0)

## 2022-11-15 LAB — TROPONIN I (HIGH SENSITIVITY): Troponin I (High Sensitivity): 692 ng/L (ref <18)

## 2022-11-15 MED ORDER — SODIUM CHLORIDE 0.9 % IV SOLN
1.0000 g | Freq: Once | INTRAVENOUS | Status: AC
  Administered 2022-11-15: 1 g via INTRAVENOUS
  Filled 2022-11-15: qty 10

## 2022-11-15 MED ORDER — ALBUTEROL SULFATE (2.5 MG/3ML) 0.083% IN NEBU
INHALATION_SOLUTION | RESPIRATORY_TRACT | Status: AC
  Administered 2022-11-15: 10 mg/h via RESPIRATORY_TRACT
  Filled 2022-11-15: qty 12

## 2022-11-15 MED ORDER — IRBESARTAN 150 MG PO TABS
150.0000 mg | ORAL_TABLET | Freq: Every day | ORAL | Status: DC
  Administered 2022-11-15: 150 mg via ORAL
  Filled 2022-11-15: qty 1

## 2022-11-15 MED ORDER — AZITHROMYCIN 250 MG PO TABS
500.0000 mg | ORAL_TABLET | Freq: Once | ORAL | Status: DC
  Filled 2022-11-15: qty 2

## 2022-11-15 MED ORDER — ASPIRIN 81 MG PO CHEW
324.0000 mg | CHEWABLE_TABLET | Freq: Once | ORAL | Status: AC
  Administered 2022-11-15: 324 mg via ORAL
  Filled 2022-11-15: qty 4

## 2022-11-15 MED ORDER — ALBUTEROL SULFATE (2.5 MG/3ML) 0.083% IN NEBU
10.0000 mg/h | INHALATION_SOLUTION | Freq: Once | RESPIRATORY_TRACT | Status: AC
  Administered 2022-11-15: 10 mg/h via RESPIRATORY_TRACT
  Filled 2022-11-15: qty 12
  Filled 2022-11-15: qty 3

## 2022-11-15 MED ORDER — METHYLPREDNISOLONE SODIUM SUCC 125 MG IJ SOLR
60.0000 mg | Freq: Two times a day (BID) | INTRAMUSCULAR | Status: DC
  Administered 2022-11-15 – 2022-11-16 (×2): 60 mg via INTRAVENOUS
  Filled 2022-11-15 (×2): qty 2

## 2022-11-15 MED ORDER — ALBUTEROL (5 MG/ML) CONTINUOUS INHALATION SOLN
10.0000 mg | INHALATION_SOLUTION | RESPIRATORY_TRACT | Status: AC
  Administered 2022-11-15: 10 mg via RESPIRATORY_TRACT
  Filled 2022-11-15: qty 5

## 2022-11-15 MED ORDER — MORPHINE SULFATE (PF) 2 MG/ML IV SOLN
2.0000 mg | Freq: Once | INTRAVENOUS | Status: DC
  Filled 2022-11-15: qty 1

## 2022-11-15 MED ORDER — FUROSEMIDE 10 MG/ML IJ SOLN
20.0000 mg | Freq: Every day | INTRAMUSCULAR | Status: DC
  Administered 2022-11-15: 20 mg via INTRAVENOUS
  Filled 2022-11-15: qty 2

## 2022-11-15 MED ORDER — HYDRALAZINE HCL 20 MG/ML IJ SOLN
10.0000 mg | INTRAMUSCULAR | Status: DC | PRN
  Administered 2022-11-15 – 2022-11-19 (×6): 10 mg via INTRAVENOUS
  Filled 2022-11-15 (×6): qty 1

## 2022-11-15 MED ORDER — IPRATROPIUM-ALBUTEROL 0.5-2.5 (3) MG/3ML IN SOLN
3.0000 mL | RESPIRATORY_TRACT | Status: DC
  Administered 2022-11-15 – 2022-11-17 (×10): 3 mL via RESPIRATORY_TRACT
  Filled 2022-11-15 (×10): qty 3

## 2022-11-15 MED ORDER — ONDANSETRON HCL 4 MG/2ML IJ SOLN: 4.0000 mg | Freq: Four times a day (QID) | INTRAMUSCULAR | Status: DC | PRN

## 2022-11-15 MED ORDER — CHLORHEXIDINE GLUCONATE CLOTH 2 % EX PADS
6.0000 | MEDICATED_PAD | Freq: Every day | CUTANEOUS | Status: DC
  Administered 2022-11-15 – 2022-11-20 (×6): 6 via TOPICAL

## 2022-11-15 MED ORDER — MORPHINE SULFATE (PF) 2 MG/ML IV SOLN
2.0000 mg | Freq: Once | INTRAVENOUS | Status: AC
  Administered 2022-11-15: 2 mg via INTRAVENOUS
  Filled 2022-11-15: qty 1

## 2022-11-15 MED ORDER — SODIUM CHLORIDE 0.9 % IV SOLN
500.0000 mg | INTRAVENOUS | Status: AC
  Administered 2022-11-15 – 2022-11-19 (×5): 500 mg via INTRAVENOUS
  Filled 2022-11-15 (×6): qty 5

## 2022-11-15 MED ORDER — ENOXAPARIN SODIUM 30 MG/0.3ML IJ SOSY
30.0000 mg | PREFILLED_SYRINGE | INTRAMUSCULAR | Status: DC
  Administered 2022-11-15: 30 mg via SUBCUTANEOUS
  Filled 2022-11-15: qty 0.3

## 2022-11-15 MED ORDER — ISOSORBIDE MONONITRATE ER 30 MG PO TB24
30.0000 mg | ORAL_TABLET | Freq: Every day | ORAL | Status: DC
  Administered 2022-11-15 – 2022-11-17 (×3): 30 mg via ORAL
  Filled 2022-11-15 (×4): qty 1

## 2022-11-15 MED ORDER — HYDRALAZINE HCL 20 MG/ML IJ SOLN
20.0000 mg | Freq: Once | INTRAMUSCULAR | Status: AC
  Administered 2022-11-15: 20 mg via INTRAVENOUS
  Filled 2022-11-15: qty 1

## 2022-11-15 MED ORDER — METHYLPREDNISOLONE SODIUM SUCC 125 MG IJ SOLR
125.0000 mg | Freq: Once | INTRAMUSCULAR | Status: AC
  Administered 2022-11-15: 125 mg via INTRAVENOUS
  Filled 2022-11-15: qty 2

## 2022-11-15 MED ORDER — CARVEDILOL 12.5 MG PO TABS
25.0000 mg | ORAL_TABLET | Freq: Two times a day (BID) | ORAL | Status: DC
  Administered 2022-11-15: 25 mg via ORAL
  Filled 2022-11-15: qty 2

## 2022-11-15 MED ORDER — SODIUM CHLORIDE 0.9 % IV SOLN: INTRAVENOUS | Status: DC

## 2022-11-15 MED ORDER — SODIUM CHLORIDE 0.9 % IV SOLN
1.0000 g | INTRAVENOUS | Status: AC
  Administered 2022-11-16 – 2022-11-20 (×5): 1 g via INTRAVENOUS
  Filled 2022-11-15 (×5): qty 10

## 2022-11-15 MED ORDER — ASPIRIN 81 MG PO TBEC
81.0000 mg | DELAYED_RELEASE_TABLET | Freq: Every morning | ORAL | Status: DC
  Administered 2022-11-17 – 2022-11-21 (×5): 81 mg via ORAL
  Filled 2022-11-15 (×6): qty 1

## 2022-11-15 MED ORDER — ONDANSETRON HCL 4 MG PO TABS: 4.0000 mg | ORAL_TABLET | Freq: Four times a day (QID) | ORAL | Status: DC | PRN

## 2022-11-15 MED ORDER — METOPROLOL TARTRATE 5 MG/5ML IV SOLN
5.0000 mg | Freq: Four times a day (QID) | INTRAVENOUS | Status: DC
  Administered 2022-11-15 – 2022-11-17 (×7): 5 mg via INTRAVENOUS
  Filled 2022-11-15 (×7): qty 5

## 2022-11-15 MED ORDER — LORAZEPAM 0.5 MG PO TABS
0.5000 mg | ORAL_TABLET | ORAL | Status: AC
  Administered 2022-11-15: 0.5 mg via ORAL
  Filled 2022-11-15: qty 1

## 2022-11-15 NOTE — Progress Notes (Signed)
Patient transported from ED to ICU-10 without any complication. Patient remains on Bipap

## 2022-11-15 NOTE — Progress Notes (Signed)
ABG drawn and taken to lab 

## 2022-11-15 NOTE — ED Triage Notes (Addendum)
Pt c/o shob x 1 hour. Used inhaler with no relief. Pt arrived to ed with shob noted with some grunting. A/o. Pt was 85% ra with ems. Per ems pt had chest pain which was relieved when 02 placed in route. No home 02.cbg 172.  Pt a/o, denies any pain or chest tightness/pain. Color wnl. Non diaphoretic.  No swelling noted

## 2022-11-15 NOTE — ED Notes (Signed)
Pt taken to CT.

## 2022-11-15 NOTE — ED Provider Notes (Signed)
[place] EMERGENCY DEPARTMENT AT [facility] Provider Note   CSN: [id number] Arrival date & time: 11/15/22  4098     History  Chief Complaint  Patient presents with   Shortness of Breath    Lisa [name] is a 88 y.o. female.   Shortness of Breath  This patient is an 88 year old female, she has a known history of COPD, she was a longtime smoker until she stopped 2 years ago.  She has lung cancer status post radiation treatment, she is treated for hypertension with carvedilol and uses inhalers at home.  She does not use oxygen at home nevertheless she has had shortness of breath which has become much worse, paramedics were called and found the patient to be 85% on room air, she was placed on nasal cannula oxygen with some relief.  Reportedly had clear lungs and so was not given any medications.  The patient states that she is having no phlegm, no swelling of the legs, shortness of breath became quite severe today.    Home Medications Prior to Admission medications   Medication Sig Start Date End Date Taking? Authorizing Provider  aspirin 81 MG EC tablet Take 81 mg by mouth in the morning.    [provider]  calcium carbonate (OS-CAL - DOSED IN MG OF ELEMENTAL CALCIUM) 1250 (500 Ca) MG tablet Take 1 tablet by mouth daily with breakfast.    [provider]  carvedilol (COREG) 25 MG tablet Take 1 tablet (25 mg total) by mouth 2 (two) times daily with a meal. 09/06/21 10/16/22  Pokhrel, Laxman, MD  Cholecalciferol (VITAMIN D3) 125 MCG (5000 UT) CAPS Take 5,000 Units by mouth daily.    [provider]  feeding supplement (ENSURE ENLIVE / ENSURE PLUS) LIQD Take 237 mLs by mouth 2 (two) times daily between meals. 12/31/21   Sherryll Burger, Pratik D, DO  furosemide (LASIX) 20 MG tablet Take 20 mg by mouth 2 (two) times daily. Morning & afternoon 04/25/21   [provider]  guaiFENesin-dextromethorphan (ROBITUSSIN DM) 100-10 MG/5ML syrup Take 10 mLs by  mouth every 4 (four) hours as needed for cough. 12/31/21   Sherryll Burger, Pratik D, DO  Ipratropium-Albuterol (COMBIVENT RESPIMAT) 20-100 MCG/ACT AERS respimat Inhale 1 puff into the lungs every 6 (six) hours as needed for wheezing or shortness of breath. 12/31/21   Sherryll Burger, Pratik D, DO  ipratropium-albuterol (DUONEB) 0.5-2.5 (3) MG/3ML SOLN Take 3 mLs by nebulization every 4 (four) hours as needed (wheezing/shortness of breath).    [provider]  Iron, Ferrous Sulfate, 325 (65 Fe) MG TABS Take by mouth.    [provider]  isosorbide mononitrate (IMDUR) 30 MG 24 hr tablet Take 1 tablet (30 mg total) by mouth daily. 09/06/21 10/16/22  Pokhrel, Rebekah Chesterfield, MD  nitroGLYCERIN (NITROSTAT) 0.4 MG SL tablet Place 1 tablet (0.4 mg total) under the tongue every 5 (five) minutes as needed for chest pain. 09/06/21 10/16/22  Pokhrel, Rebekah Chesterfield, MD  ondansetron (ZOFRAN) 4 MG tablet Take 1 tablet (4 mg total) by mouth every 6 (six) hours as needed for nausea. 09/06/21   Pokhrel, Rebekah Chesterfield, MD  telmisartan (MICARDIS) 20 MG tablet Take 0.5 tablets by mouth daily. 06/23/22 06/23/23  [provider]      Allergies    Patient has no known allergies.    Review of Systems   Review of Systems  Respiratory:  Positive for shortness of breath.   All other systems reviewed and are negative.   Physical Exam  Updated Vital Signs BP (!) 184/89   Pulse 75   Temp 97.9 F (36.6 C) (Oral)   Resp (!) 27   SpO2 100%  Physical Exam Vitals and nursing note reviewed.  Constitutional:      General: She is not in acute distress.    Appearance: She is well-developed.  HENT:     Head: Normocephalic and atraumatic.     Mouth/Throat:     Pharynx: No oropharyngeal exudate.  Eyes:     General: No scleral icterus.       Right eye: No discharge.        Left eye: No discharge.     Conjunctiva/sclera: Conjunctivae normal.     Pupils: Pupils are equal, round, and reactive to light.  Neck:     Thyroid: No thyromegaly.      Vascular: No JVD.  Cardiovascular:     Rate and Rhythm: Regular rhythm. Tachycardia present.     Heart sounds: Normal heart sounds. No murmur heard.    No friction rub. No gallop.  Pulmonary:     Effort: Respiratory distress present.     Breath sounds: Wheezing and rhonchi present. No rales.  Abdominal:     General: Bowel sounds are normal. There is no distension.     Palpations: Abdomen is soft. There is no mass.     Tenderness: There is no abdominal tenderness.  Musculoskeletal:        General: No tenderness. Normal range of motion.     Cervical back: Normal range of motion and neck supple.     Right lower leg: No edema.     Left lower leg: No edema.  Lymphadenopathy:     Cervical: No cervical adenopathy.  Skin:    General: Skin is warm and dry.     Findings: No erythema or rash.  Neurological:     Mental Status: She is alert.     Coordination: Coordination normal.  Psychiatric:        Behavior: Behavior normal.     ED Results / Procedures / Treatments   Labs (all labs ordered are listed, but only abnormal results are displayed) Labs Reviewed  COMPREHENSIVE METABOLIC PANEL - Abnormal; Notable for the following components:      Result Value   Glucose, Bld 152 (*)    BUN 65 (*)    Creatinine, Ser 1.80 (*)    Calcium 8.7 (*)    Total Protein 9.7 (*)    Albumin 3.1 (*)    GFR, Estimated 27 (*)    All other components within normal limits  BRAIN NATRIURETIC PEPTIDE - Abnormal; Notable for the following components:   B Natriuretic Peptide 612.0 (*)    All other components within normal limits  BLOOD GAS, VENOUS - Abnormal; Notable for the following components:   pCO2, Ven 68 (*)    pO2, Ven 59 (*)    Bicarbonate 30.0 (*)    All other components within normal limits  CBC WITH DIFFERENTIAL/PLATELET - Abnormal; Notable for the following components:   RBC 2.94 (*)    Hemoglobin 9.2 (*)    HCT 28.6 (*)    Platelets 91 (*)    All other components within normal limits   RESP PANEL BY RT-PCR (RSV, FLU A&B, COVID)  RVPGX2  LACTIC ACID, PLASMA  LACTIC ACID, PLASMA    EKG EKG Interpretation  Date/Time:  Wednesday November 15 2022 08:08:16 EDT Ventricular Rate:  82 PR Interval:  111 QRS Duration: 92 QT  Interval:  354 QTC Calculation: 414 R Axis:   70 Text Interpretation: Sinus rhythm Borderline short PR interval LVH with secondary repolarization abnormality Anterior Q waves, possibly due to LVH ST depression, consider ischemia, diffuse lds Baseline wander in lead(s) V5 Confirmed by Eber Hong ([id number]) on 11/15/2022 8:36:05 AM  Radiology CT Chest Wo Contrast  Result Date: 11/15/2022 CLINICAL DATA:  Shortness of breath. History of lung cancer. Left apical nodular density on a portable chest radiograph today with an appearance suspicious for recurrent non-small-cell lung cancer. Chest CT was recommended for further evaluation. Status post radiation therapy. EXAM: CT CHEST WITHOUT CONTRAST TECHNIQUE: Multidetector CT imaging of the chest was performed following the standard protocol without IV contrast. RADIATION DOSE REDUCTION: This exam was performed according to the departmental dose-optimization program which includes automated exposure control, adjustment of the mA and/or kV according to patient size and/or use of iterative reconstruction technique. COMPARISON:  Chest radiographs dated 11/15/2022 and 12/29/2021. Chest CT dated 05/31/2022. FINDINGS: Cardiovascular: Atheromatous calcifications, including the coronary arteries and aorta. Normal sized heart. No pericardial effusion. Mediastinum/Nodes: Marked diffuse enlargement of the thyroid gland with diffuse heterogeneity and calcifications and substernal extension. This is causing mild tracheal narrowing without significant change. No enlarged lymph nodes. The previously demonstrated 9 mm short axis AP window node measures 8 mm in short axis diameter on image number 64/2. Unremarkable esophagus. Lungs/Pleura: Multiple  surgical clips in an oval area of consolidation and adjacent prominent interstitial markings in the left upper lobe, containing multiple interval surgical clips. This area measures 5.3 x 2.6 cm on image number 35/4 and corresponds to the mass-like density seen the portable chest obtained earlier today. This extends posteriorly to the major fissure with associated pleural thickening. The previously demonstrated left upper lobe nodule in this area is no longer visualized. No new lung nodules and no pleural fluid. The lungs remain hyperexpanded. Upper Abdomen: Extensive atheromatous arterial calcifications. Cholecystectomy clips. Musculoskeletal: Lower cervical spine degenerative changes and mild thoracic spine degenerative changes. IMPRESSION: 1. 5.3 x 2.6 cm oval area of consolidation and adjacent prominent interstitial markings in the left upper lobe, corresponding to the mass-like density seen on the portable chest obtained earlier today. This is compatible with postsurgical and post radiation changes. 2. The previously demonstrated left upper lobe nodule in this area is no longer visualized, compatible with interval resection. 3. No enlarged lymph nodes. 4. Stable large thyroid goiter causing mild tracheal narrowing. This has been previously evaluated with imaging. 5. Calcific coronary artery and aortic atherosclerosis. 6. Stable changes of COPD. Aortic Atherosclerosis (ICD10-I70.0) and Emphysema (ICD10-J43.9). Electronically Signed   By: Beckie Salts M.D.   On: 11/15/2022 10:53   DG Chest Port 1 View  Result Date: 11/15/2022 CLINICAL DATA:  Shortness of breath, history of lung cancer. EXAM: PORTABLE CHEST 1 VIEW COMPARISON:  May 29, 2022.  December 29, 2021. FINDINGS: The heart size and mediastinal contours are within normal limits. There is noted large left apical nodular density concerning for malignancy. Surgical clips are seen in this area. Right lung is unremarkable. The visualized skeletal structures  are unremarkable. IMPRESSION: Large left apical nodular density is noted concerning for recurrent malignancy. CT scan of the chest is recommended for further evaluation. Electronically Signed   By: Lupita Raider M.D.   On: 11/15/2022 08:18    Procedures .Critical Care  Performed by: Eber Hong, MD Authorized by: Eber Hong, MD   Critical care provider statement:    Critical care time (minutes):  45   Critical care time was exclusive of:  Separately billable procedures and treating other patients and teaching time   Critical care was necessary to treat or prevent imminent or life-threatening deterioration of the following conditions:  Respiratory failure and renal failure   Critical care was time spent personally by me on the following activities:  Development of treatment plan with patient or surrogate, discussions with consultants, evaluation of patient's response to treatment, examination of patient, ordering and review of laboratory studies, ordering and review of radiographic studies, ordering and performing treatments and interventions, pulse oximetry, re-evaluation of patient's condition, review of old charts and obtaining history from patient or surrogate   I assumed direction of critical care for this patient from another provider in my specialty: no     Care discussed with: admitting provider   Comments:           Medications Ordered in ED Medications  carvedilol (COREG) tablet 25 mg (25 mg Oral Given 11/15/22 0850)  isosorbide mononitrate (IMDUR) 24 hr tablet 30 mg (30 mg Oral Given 11/15/22 0948)  irbesartan (AVAPRO) tablet 150 mg (150 mg Oral Given 11/15/22 0948)  0.9 %  sodium chloride infusion ( Intravenous New Bag/Given 11/15/22 0956)  cefTRIAXone (ROCEPHIN) 1 g in sodium chloride 0.9 % 100 mL IVPB (has no administration in time range)  azithromycin (ZITHROMAX) tablet 500 mg (has no administration in time range)  albuterol (PROVENTIL,VENTOLIN) solution continuous neb (has  no administration in time range)  methylPREDNISolone sodium succinate (SOLU-MEDROL) 125 mg/2 mL injection 125 mg (125 mg Intravenous Given 11/15/22 0834)  albuterol (PROVENTIL) (2.5 MG/3ML) 0.083% nebulizer solution (10 mg/hr Nebulization Given 11/15/22 0850)  aspirin chewable tablet 324 mg (324 mg Oral Given 11/15/22 0850)  LORazepam (ATIVAN) tablet 0.5 mg (0.5 mg Oral Given 11/15/22 1128)  hydrALAZINE (APRESOLINE) injection 20 mg (20 mg Intravenous Given 11/15/22 1243)    ED Course/ Medical Decision Making/ A&P                             Medical Decision Making Amount and/or Complexity of Data Reviewed Labs: ordered. Radiology: ordered.  Risk OTC drugs. Prescription drug management. Decision regarding hospitalization.    This patient presents to the ED for concern of shortness of breath, this involves an extensive number of treatment options, and is a complaint that carries with it a high risk of complications and morbidity.  The differential diagnosis includes pneumonia, COPD, pulmonary embolism, pneumothorax, pleural effusion   Co morbidities that complicate the patient evaluation  Known history of cancer, prior smoker, long history of COPD   Additional history obtained:  Additional history obtained from medical record External records from outside source obtained and reviewed including is followed in the family medicine clinic as well as nephrology for stage IIIb chronic kidney disease, has known malignant neoplasm of the left lung as well as a monoclonal gammopathy, most recently seen by oncology in January The stage I adenocarcinoma of the left upper lobe was a approximately 1-1/2 cm nodule, not a surgical candidate according to cardiovascular surgery, oncology recommended follow-up CT scans   Lab Tests:  I Ordered, and personally interpreted labs.  The pertinent results include: No leukocytosis, progressive renal insufficiency   Imaging Studies ordered:  I ordered imaging  studies including chest strain and a follow-up CT scan of the chest I independently visualized and interpreted imaging which showed no signs of pneumonia, no recurrent tumor I agree with  the radiologist interpretation   Cardiac Monitoring: / EKG:  The patient was maintained on a cardiac monitor.  I personally viewed and interpreted the cardiac monitored which showed an underlying rhythm of: Sinus   Consultations Obtained:  I requested consultation with the hospitalist,  and discussed lab and imaging findings as well as pertinent plan - they recommend: Admit to hospital   Problem List / ED Course / Critical interventions / Medication management  The shortness of breath is still somewhat undifferentiated, unfortunately she could not lay still for a VQ scan, the CT angiogram was unable to be performed secondary to her renal function and low GFR.  She was given albuterol treatments, continuous nebulizer, antibiotics, still maintains her position in an upright position and feeling short of breath which may be related to orthopnea although it could be related to a PE, this was discussed with the hospitalist I ordered medication including albuterol continuous nebulizer for respiratory distress Reevaluation of the patient after these medicines showed that the patient some improvement but still oxygen requiring I have reviewed the patients home medicines and have made adjustments as needed   Social Determinants of Health:  COPD   Test / Admission - Considered:  Admit to higher level of care         Final Clinical Impression(s) / ED Diagnoses Final diagnoses:  Acute respiratory distress  Acute congestive heart failure, unspecified heart failure type [facility])  Renal insufficiency    Rx / DC Orders ED Discharge Orders     None         Eber Hong, MD 11/15/22 1400

## 2022-11-15 NOTE — Hospital Course (Addendum)
88 year old female with stage IIIb CKD, chronic hepatitis C, former heavy tobacco user, hypertension, COPD, malignant neoplasm of left lung, treated with radiation, smoldering myeloma, goiter, monoclonal gammopathy of unknown significance, chronic dyspnea, coronary artery disease, anemia and CKD, diastolic heart failure with failure to thrive presented to the emergency department by EMS with acute onset of severe shortness of breath.  She has had progressive worsening shortness of breath since yesterday.  She started showing more signs of respiratory stridor and grunting and EMS noted pulse ox 85% on room air.  She complained of chest tightness and chest pain.  She is not normally on supplemental oxygen but was placed on nasal cannula en route.  Patient was severely hypertensive on arrival with systolic blood pressure of 238.  She was given albuterol bronchodilator treatment and IV steroids.  Her chest x-ray and CT chest showed chronic findings.  Unfortunately her respiratory distress progressed and she required continuous BiPAP therapy.  She was not able to have IV contrast for CTA chest and VQ scan not completed.  She is being admitted for acute respiratory distress presumably secondary to his COPD exacerbation.  After discussion with her healthcare surrogate decision made to make patient DNR but continue BiPAP treatments and stepdown ICU care.  Influenza and COVID testing negative.

## 2022-11-15 NOTE — H&P (Signed)
History and Physical  [facility]  Lisa [name] ZOX:[id number] DOB: 07-Nov-1934 DOA: 11/15/2022  PCP: Lisa Lade, MD  Patient coming from: Home by [facility]  Level of care: Stepdown  I have personally briefly reviewed patient's old medical records in [facility]  Chief Complaint: SOB   HPI: Lisa [name] is a 87 year old female with stage IIIb CKD, chronic hepatitis C, former heavy tobacco user, hypertension, COPD, malignant neoplasm of left lung, treated with radiation, smoldering myeloma, goiter, monoclonal gammopathy of unknown significance, chronic dyspnea, coronary artery disease, anemia and CKD, diastolic heart failure with failure to thrive presented to the emergency department by EMS with acute onset of severe shortness of breath.  She has had progressive worsening shortness of breath since yesterday.  She started showing more signs of respiratory stridor and grunting and EMS noted pulse ox 85% on room air.  She complained of chest tightness and chest pain.  She is not normally on supplemental oxygen but was placed on nasal cannula en route.  Patient was severely hypertensive on arrival with systolic blood pressure of 238.  She was given albuterol bronchodilator treatment and IV steroids.  Her chest x-ray and CT chest showed chronic findings.  Unfortunately her respiratory distress progressed and she required continuous BiPAP therapy.  She was not able to have IV contrast for CTA chest and VQ scan not completed.  She is being admitted for acute respiratory distress presumably secondary to his COPD exacerbation.  After discussion with her healthcare surrogate decision made to make patient DNR but continue BiPAP treatments and stepdown ICU care.  Influenza and COVID testing negative.    Past Medical History:  Diagnosis Date   Anemia    Carotid artery stenosis    CKD (chronic kidney disease)    COPD (chronic obstructive pulmonary disease) (HCC)    Dyspnea    Due to  COPD per patient   Goiter diffuse, nontoxic    Per patient   Hepatitis C    History of radiation therapy    Left lung-10/10/21-10/17/21- Dr. Antony Blackbird   Hypertension    Monoclonal gammopathy of unknown significance (MGUS)    Pyelonephritis 04/06/2014   UTI    Past Surgical History:  Procedure Laterality Date   ABDOMINAL HYSTERECTOMY     CHOLECYSTECTOMY N/A 09/17/2018   Procedure: LAPAROSCOPIC CHOLECYSTECTOMY;  Surgeon: Lucretia Roers, MD;  Location: [facility] ORS;  Service: General;  Laterality: N/A;   FUDUCIAL PLACEMENT Left 09/01/2021   Procedure: PLACEMENT OF FIDUCIAL MARKERS TIMES FOUR;  Surgeon: Loreli Slot, MD;  Location: [facility] OR;  Service: Thoracic;  Laterality: Left;   TEE WITHOUT CARDIOVERSION N/A 04/10/2014   Procedure: TRANSESOPHAGEAL ECHOCARDIOGRAM (TEE);  Surgeon: Donato Schultz, MD;  Location: [facility] ENDOSCOPY;  Service: Cardiovascular;  Laterality: N/A;   VIDEO BRONCHOSCOPY WITH ENDOBRONCHIAL NAVIGATION N/A 09/01/2021   Procedure: VIDEO BRONCHOSCOPY WITH ENDOBRONCHIAL NAVIGATION;  Surgeon: Loreli Slot, MD;  Location: [facility] OR;  Service: Thoracic;  Laterality: N/A;     reports that she has quit smoking. Her smoking use included cigarettes. She smoked an average of .25 packs per day. She has been exposed to tobacco smoke. She has never used smokeless tobacco. She reports that she does not drink alcohol and does not use drugs.  No Known Allergies  Family History  Problem Relation Age of Onset   Hypertension Mother    Thyroid disease Mother    Hypertension Father    Thyroid disease Sister  Thyroid disease Brother    Thyroid disease Sister     Prior to Admission medications   Medication Sig Start Date End Date Taking? Authorizing Provider  aspirin 81 MG EC tablet Take 81 mg by mouth in the morning.   Yes [provider]  calcium carbonate (OS-CAL - DOSED IN MG OF ELEMENTAL CALCIUM) 1250 (500 Ca) MG tablet Take 1 tablet by mouth daily with breakfast.    Yes [provider]  Cholecalciferol (VITAMIN D3) 125 MCG (5000 UT) CAPS Take 5,000 Units by mouth daily.   Yes [provider]  feeding supplement (ENSURE ENLIVE / ENSURE PLUS) LIQD Take 237 mLs by mouth 2 (two) times daily between meals. 12/31/21  Yes Shah, Pratik D, DO  furosemide (LASIX) 20 MG tablet Take 20 mg by mouth 2 (two) times daily. Morning & afternoon 04/25/21  Yes [provider]  Ipratropium-Albuterol (COMBIVENT RESPIMAT) 20-100 MCG/ACT AERS respimat Inhale 1 puff into the lungs every 6 (six) hours as needed for wheezing or shortness of breath. 12/31/21  Yes Sherryll Burger, Pratik D, DO  ipratropium-albuterol (DUONEB) 0.5-2.5 (3) MG/3ML SOLN Take 3 mLs by nebulization every 4 (four) hours as needed (wheezing/shortness of breath).   Yes [provider]  Iron, Ferrous Sulfate, 325 (65 Fe) MG TABS Take by mouth.   Yes [provider]  nitroGLYCERIN (NITROSTAT) 0.4 MG SL tablet Place 1 tablet (0.4 mg total) under the tongue every 5 (five) minutes as needed for chest pain. 09/06/21 11/15/22 Yes Pokhrel, Laxman, MD  ondansetron (ZOFRAN) 4 MG tablet Take 1 tablet (4 mg total) by mouth every 6 (six) hours as needed for nausea. 09/06/21  Yes Pokhrel, Laxman, MD  telmisartan (MICARDIS) 20 MG tablet Take 20 mg by mouth daily. 06/23/22 06/23/23 Yes [provider]  amLODipine (NORVASC) 2.5 MG tablet Take 2.5 mg by mouth daily. Patient not taking: Reported on 11/15/2022 06/23/22   [provider]  carvedilol (COREG) 25 MG tablet Take 1 tablet (25 mg total) by mouth 2 (two) times daily with a meal. Patient not taking: Reported on 11/15/2022 09/06/21 10/16/22  Joycelyn Das, MD  isosorbide mononitrate (IMDUR) 30 MG 24 hr tablet Take 1 tablet (30 mg total) by mouth daily. Patient not taking: Reported on 11/15/2022 09/06/21 11/15/22  Joycelyn Das, MD    Physical Exam: Vitals:   11/15/22 1030 11/15/22 1130 11/15/22 1200 11/15/22 1330  BP: (!) 226/87 (!) 218/102  (!) 233/81 (!) 184/89  Pulse: 72 71 69 75  Resp: (!) 21 (!) 23 (!) 21 (!) 27  Temp:   97.9 F (36.6 C) 98.1 F (36.7 C)  TempSrc:   Oral Axillary  SpO2: 100% 100% 100% 100%    Constitutional: NAD, calm, comfortable Eyes: PERRL, lids and conjunctivae normal ENMT: Mucous membranes are moist. Posterior pharynx clear of any exudate or lesions.Normal dentition.  Neck: normal, supple, no masses, no thyromegaly Respiratory: clear to auscultation bilaterally, no wheezing, no crackles. Normal respiratory effort. No accessory muscle use.  Cardiovascular: normal s1, s2 sounds, no murmurs / rubs / gallops. No extremity edema. 2+ pedal pulses. No carotid bruits.  Abdomen: no tenderness, no masses palpated. No hepatosplenomegaly. Bowel sounds positive.  Musculoskeletal: no clubbing / cyanosis. No joint deformity upper and lower extremities. Good ROM, no contractures. Normal muscle tone.  Skin: no rashes, lesions, ulcers. No induration Neurologic: CN 2-12 grossly intact. Sensation intact, DTR normal. Strength 5/5 in all 4.  Psychiatric: Normal judgment and insight. Alert and oriented x 3. Normal mood.  Labs on Admission: I have personally reviewed following labs and imaging studies  CBC: Recent Labs  Lab 11/15/22 0805  WBC 4.9  NEUTROABS 3.3  HGB 9.2*  HCT 28.6*  MCV 97.3  PLT 91*   Basic Metabolic Panel: Recent Labs  Lab 11/15/22 0805  NA 135  K 3.9  CL 103  CO2 25  GLUCOSE 152*  BUN 65*  CREATININE 1.80*  CALCIUM 8.7*   GFR: CrCl cannot be calculated (Unknown ideal weight.). Liver Function Tests: Recent Labs  Lab 11/15/22 0805  AST 35  ALT 23  ALKPHOS 59  BILITOT 0.4  PROT 9.7*  ALBUMIN 3.1*   No results for input(s): "LIPASE", "AMYLASE" in the last 168 hours. No results for input(s): "AMMONIA" in the last 168 hours. Coagulation Profile: No results for input(s): "INR", "PROTIME" in the last 168 hours. Cardiac Enzymes: No results for input(s): "CKTOTAL", "CKMB",  "CKMBINDEX", "TROPONINI" in the last 168 hours. BNP (last 3 results) No results for input(s): "PROBNP" in the last 8760 hours. HbA1C: No results for input(s): "HGBA1C" in the last 72 hours. CBG: No results for input(s): "GLUCAP" in the last 168 hours. Lipid Profile: No results for input(s): "CHOL", "HDL", "LDLCALC", "TRIG", "CHOLHDL", "LDLDIRECT" in the last 72 hours. Thyroid Function Tests: No results for input(s): "TSH", "T4TOTAL", "FREET4", "T3FREE", "THYROIDAB" in the last 72 hours. Anemia Panel: No results for input(s): "VITAMINB12", "FOLATE", "FERRITIN", "TIBC", "IRON", "RETICCTPCT" in the last 72 hours. Urine analysis:    Component Value Date/Time   COLORURINE YELLOW 02/07/2021 1550   APPEARANCEUR HAZY (A) 02/07/2021 1550   LABSPEC 1.018 02/07/2021 1550   PHURINE 5.0 02/07/2021 1550   GLUCOSEU NEGATIVE 02/07/2021 1550   HGBUR NEGATIVE 02/07/2021 1550   BILIRUBINUR NEGATIVE 02/07/2021 1550   KETONESUR 5 (A) 02/07/2021 1550   PROTEINUR 30 (A) 02/07/2021 1550   UROBILINOGEN 0.2 06/02/2014 1715   NITRITE NEGATIVE 02/07/2021 1550   LEUKOCYTESUR NEGATIVE 02/07/2021 1550    Radiological Exams on Admission: CT Chest Wo Contrast  Result Date: 11/15/2022 CLINICAL DATA:  Shortness of breath. History of lung cancer. Left apical nodular density on a portable chest radiograph today with an appearance suspicious for recurrent non-small-cell lung cancer. Chest CT was recommended for further evaluation. Status post radiation therapy. EXAM: CT CHEST WITHOUT CONTRAST TECHNIQUE: Multidetector CT imaging of the chest was performed following the standard protocol without IV contrast. RADIATION DOSE REDUCTION: This exam was performed according to the departmental dose-optimization program which includes automated exposure control, adjustment of the mA and/or kV according to patient size and/or use of iterative reconstruction technique. COMPARISON:  Chest radiographs dated 11/15/2022 and 12/29/2021.  Chest CT dated 05/31/2022. FINDINGS: Cardiovascular: Atheromatous calcifications, including the coronary arteries and aorta. Normal sized heart. No pericardial effusion. Mediastinum/Nodes: Marked diffuse enlargement of the thyroid gland with diffuse heterogeneity and calcifications and substernal extension. This is causing mild tracheal narrowing without significant change. No enlarged lymph nodes. The previously demonstrated 9 mm short axis AP window node measures 8 mm in short axis diameter on image number 64/2. Unremarkable esophagus. Lungs/Pleura: Multiple surgical clips in an oval area of consolidation and adjacent prominent interstitial markings in the left upper lobe, containing multiple interval surgical clips. This area measures 5.3 x 2.6 cm on image number 35/4 and corresponds to the mass-like density seen the portable chest obtained earlier today. This extends posteriorly to the major fissure with associated pleural thickening. The previously demonstrated left upper lobe nodule in this area is no longer visualized. No new lung  nodules and no pleural fluid. The lungs remain hyperexpanded. Upper Abdomen: Extensive atheromatous arterial calcifications. Cholecystectomy clips. Musculoskeletal: Lower cervical spine degenerative changes and mild thoracic spine degenerative changes. IMPRESSION: 1. 5.3 x 2.6 cm oval area of consolidation and adjacent prominent interstitial markings in the left upper lobe, corresponding to the mass-like density seen on the portable chest obtained earlier today. This is compatible with postsurgical and post radiation changes. 2. The previously demonstrated left upper lobe nodule in this area is no longer visualized, compatible with interval resection. 3. No enlarged lymph nodes. 4. Stable large thyroid goiter causing mild tracheal narrowing. This has been previously evaluated with imaging. 5. Calcific coronary artery and aortic atherosclerosis. 6. Stable changes of COPD. Aortic  Atherosclerosis (ICD10-I70.0) and Emphysema (ICD10-J43.9). Electronically Signed   By: Beckie Salts M.D.   On: 11/15/2022 10:53   DG Chest Port 1 View  Result Date: 11/15/2022 CLINICAL DATA:  Shortness of breath, history of lung cancer. EXAM: PORTABLE CHEST 1 VIEW COMPARISON:  May 29, 2022.  December 29, 2021. FINDINGS: The heart size and mediastinal contours are within normal limits. There is noted large left apical nodular density concerning for malignancy. Surgical clips are seen in this area. Right lung is unremarkable. The visualized skeletal structures are unremarkable. IMPRESSION: Large left apical nodular density is noted concerning for recurrent malignancy. CT scan of the chest is recommended for further evaluation. Electronically Signed   By: Lupita Raider M.D.   On: 11/15/2022 08:18    EKG: Independently reviewed. Sinus rhythm   Assessment/Plan Principal Problem:   Acute on chronic respiratory failure with hypoxia (HCC) Active Problems:   Hypertensive urgency   COPD with acute exacerbation (HCC)   Hypertension   COPD (chronic obstructive pulmonary disease) (HCC)   H/O goiter   Encephalopathy   Protein-calorie malnutrition, severe (HCC)   Smoldering multiple myeloma   Anemia due to stage 4 chronic kidney disease (HCC)   Chronic hepatitis C (HCC)   CKD (chronic kidney disease) stage 4, GFR 15-29 ml/min (HCC)   Mass of upper lobe of left lung   Adenocarcinoma of left lung (HCC)   Malignant neoplasm of upper lobe of left lung (HCC)   Elevated brain natriuretic peptide (BNP) level   FTT (failure to thrive) in adult   Underweight   Acute respiratory distress   Acute on chronic respiratory failure with hypoxia -Patient presents with acute severe respiratory distress requiring continuous BiPAP therapy -Goals of care discussion with healthcare surrogate patient made DNR -Continue aggressive IV steroids and scheduled bronchodilators -Continue supplemental oxygen -when able to  take p.o. will add mucolytics and cough suppressants -follow for clinical improvement  -if no meaningful improvement, would request palliative medicine consultation   Acute Encephalopathy -secondary to hypercarbia related to acute respiratory failure -mentation is improving on bipap -if worsens would get ABG study  COPD with acute exacerbation  -continue IV methylprednisolone scheduled every 12 hours -continue IV antibiotics -continue scheduled bronchodilators   Severe protein calorie malnutrition -pt is severely emaciated -will request dietitian consultation when more medically stable  Adult Failure to thrive  -IPAL meeting for goals of care discussion with brother - healthcare decision maker -after discussions, decision made to make DNR  -if not improving with aggressive treatments would get a palliative medicine consultation  Stage IV CKD  -monitor Cr daily with diuresis -renally dose medication   Elevated BNP -likely acute exacerbation of diastolic heart failure in setting of severe respiratory distress -IV lasix 20 mg daily  ordered   Hypertensive Urgency  -severely elevated BPs likely related to acute respiratory failure -Pt presented with severe respiratory distress  -IV hydralazine ordered, IV metoprolol ordered -holding home oral meds in setting of NPO status   Adenocarcinoma / Mass of Left upper lung -s/p radiation treatments by Dr. Roselind Messier 5/1-10/17/2021 -CT and CXR show stable findings likely residual scarring -Follow up with Dr. Ellin Saba for ongoing surveillance as scheduled  MGUS / smoldering multiple myeloma - she is being followed for this by Dr. Ellin Saba    DVT prophylaxis: enoxaparin   Code Status: DNR   Family Communication: brother 11/15/22   Disposition Plan: TBD   Consults called:   Admission status: INP   Critical Care Procedure Note Authorized and Performed by: Maryln Manuel MD  Total Critical Care time:  68 mins Due to a high probability of  clinically significant, life threatening deterioration, the patient required my highest level of preparedness to intervene emergently and I personally spent this critical care time directly and personally managing the patient.  This critical care time included obtaining a history; examining the patient, pulse oximetry; ordering and review of studies; arranging urgent treatment with development of a management plan; evaluation of patient's response of treatment; frequent reassessment; and discussions with other providers.  This critical care time was performed to assess and manage the high probability of imminent and life threatening deterioration that could result in multi-organ failure.  It was exclusive of separately billable procedures and treating other patients and teaching time.      Level of care: Stepdown Standley Dakins MD [facility] How to contact the [facility] Attending or Consulting provider 7A - 7P or covering provider during after hours 7P -7A, for this patient?  Check the care team in [facility] and look for a) attending/consulting [facility] provider listed and b) the [facility] team listed Log into [email or web address] and use [facility] universal password to access. If you do not have the password, please contact the hospital operator. Locate the [facility] provider you are looking for under [facility] and page to a number that you can be directly reached. If you still have difficulty reaching the provider, please page the [facility] (Director on Call) for the Hospitalists listed on amion for assistance.   If 7PM-7AM, please contact night-coverage [email or web address] Password [facility]  11/15/2022, 4:24 PM

## 2022-11-15 NOTE — ED Notes (Signed)
RT at bedside.  IV removed by pt.

## 2022-11-15 NOTE — IPAL (Signed)
  Interdisciplinary Goals of Care Family Meeting   Date carried out: 11/15/2022  Location of the meeting: Unit  EMERGENCY DEPT   Member's involved: Physician and Family Member or next of kin  Durable Power of Attorney or acting medical decision maker: BROTHER     Discussion: We discussed goals of care for [employer] .  Pt's condition has rapidly deteriorated from having a new supplemental oxygen requirement with nasal cannula to now being on continuous bipap and still struggling to breathe. We talked about next steps would be intubation and life support.  We talked about things that could happen during a code situation and asked about what brother thought patient's wishes would be given her advanced age and co-morbidities and low likelihood of having a meaningful recovery. Brother decided to make patient DNR. Treat up to point of having to provide artificial life support.  He is ok with continuing bipap therapy for now.  He is ok with keeping in stepdown ICU.    Code status:   Code Status: DNR   Disposition: TBD  Time spent for the meeting: 30 mins   Standley Dakins, MD  11/15/2022, 3:30 PM

## 2022-11-15 NOTE — Progress Notes (Signed)
Patient complaining of chest pain, radiating to her back as 8/10, patient is in BIPAP, did 132 lleasd EKG, shows NSR with possible inferior endocardial injury, Troponin ordered,

## 2022-11-15 NOTE — ED Notes (Signed)
Float nurse Claris Gower, RN to take pt to ICU bed 10.

## 2022-11-16 ENCOUNTER — Encounter (HOSPITAL_COMMUNITY): Payer: Self-pay | Admitting: Family Medicine

## 2022-11-16 ENCOUNTER — Inpatient Hospital Stay (HOSPITAL_COMMUNITY): Payer: Medicare Other

## 2022-11-16 DIAGNOSIS — C3492 Malignant neoplasm of unspecified part of left bronchus or lung: Secondary | ICD-10-CM | POA: Diagnosis not present

## 2022-11-16 DIAGNOSIS — N184 Chronic kidney disease, stage 4 (severe): Secondary | ICD-10-CM | POA: Diagnosis not present

## 2022-11-16 DIAGNOSIS — J9621 Acute and chronic respiratory failure with hypoxia: Secondary | ICD-10-CM | POA: Diagnosis not present

## 2022-11-16 DIAGNOSIS — Z515 Encounter for palliative care: Secondary | ICD-10-CM

## 2022-11-16 DIAGNOSIS — I160 Hypertensive urgency: Secondary | ICD-10-CM | POA: Diagnosis not present

## 2022-11-16 DIAGNOSIS — R627 Adult failure to thrive: Secondary | ICD-10-CM | POA: Diagnosis not present

## 2022-11-16 DIAGNOSIS — I5031 Acute diastolic (congestive) heart failure: Secondary | ICD-10-CM | POA: Diagnosis not present

## 2022-11-16 DIAGNOSIS — I214 Non-ST elevation (NSTEMI) myocardial infarction: Secondary | ICD-10-CM

## 2022-11-16 DIAGNOSIS — Z7189 Other specified counseling: Secondary | ICD-10-CM

## 2022-11-16 LAB — CBC WITH DIFFERENTIAL/PLATELET
Abs Immature Granulocytes: 0.03 10*3/uL (ref 0.00–0.07)
Basophils Absolute: 0 10*3/uL (ref 0.0–0.1)
Basophils Relative: 0 %
Eosinophils Absolute: 0 10*3/uL (ref 0.0–0.5)
Eosinophils Relative: 0 %
HCT: 27.9 % — ABNORMAL LOW (ref 36.0–46.0)
Hemoglobin: 9 g/dL — ABNORMAL LOW (ref 12.0–15.0)
Immature Granulocytes: 1 %
Lymphocytes Relative: 12 %
Lymphs Abs: 0.4 10*3/uL — ABNORMAL LOW (ref 0.7–4.0)
MCH: 31.4 pg (ref 26.0–34.0)
MCHC: 32.3 g/dL (ref 30.0–36.0)
MCV: 97.2 fL (ref 80.0–100.0)
Monocytes Absolute: 0.1 10*3/uL (ref 0.1–1.0)
Monocytes Relative: 2 %
Neutro Abs: 2.9 10*3/uL (ref 1.7–7.7)
Neutrophils Relative %: 85 %
Platelets: 102 10*3/uL — ABNORMAL LOW (ref 150–400)
RBC: 2.87 MIL/uL — ABNORMAL LOW (ref 3.87–5.11)
RDW: 12.9 % (ref 11.5–15.5)
WBC: 3.4 10*3/uL — ABNORMAL LOW (ref 4.0–10.5)
nRBC: 0 % (ref 0.0–0.2)

## 2022-11-16 LAB — BRAIN NATRIURETIC PEPTIDE: B Natriuretic Peptide: 1659 pg/mL — ABNORMAL HIGH (ref 0.0–100.0)

## 2022-11-16 LAB — BASIC METABOLIC PANEL
Anion gap: 12 (ref 5–15)
BUN: 73 mg/dL — ABNORMAL HIGH (ref 8–23)
CO2: 23 mmol/L (ref 22–32)
Calcium: 8.7 mg/dL — ABNORMAL LOW (ref 8.9–10.3)
Chloride: 103 mmol/L (ref 98–111)
Creatinine, Ser: 1.86 mg/dL — ABNORMAL HIGH (ref 0.44–1.00)
GFR, Estimated: 26 mL/min — ABNORMAL LOW (ref >60)
Glucose, Bld: 122 mg/dL — ABNORMAL HIGH (ref 70–99)
Potassium: 4.1 mmol/L (ref 3.5–5.1)
Sodium: 138 mmol/L (ref 135–145)

## 2022-11-16 LAB — MAGNESIUM: Magnesium: 2 mg/dL (ref 1.7–2.4)

## 2022-11-16 LAB — TROPONIN I (HIGH SENSITIVITY)
Troponin I (High Sensitivity): 12379 ng/L (ref <18)
Troponin I (High Sensitivity): 504 ng/L (ref <18)

## 2022-11-16 LAB — ECHOCARDIOGRAM COMPLETE
Area-P 1/2: 4.6 cm2
S' Lateral: 3 cm
Weight: 1661.39 oz

## 2022-11-16 MED ORDER — FUROSEMIDE 10 MG/ML IJ SOLN
60.0000 mg | Freq: Two times a day (BID) | INTRAMUSCULAR | Status: DC
  Administered 2022-11-16 (×2): 60 mg via INTRAVENOUS
  Filled 2022-11-16 (×2): qty 6

## 2022-11-16 MED ORDER — HEPARIN BOLUS VIA INFUSION
2800.0000 [IU] | Freq: Once | INTRAVENOUS | Status: AC
  Administered 2022-11-16: 2800 [IU] via INTRAVENOUS
  Filled 2022-11-16: qty 2800

## 2022-11-16 MED ORDER — HEPARIN (PORCINE) 25000 UT/250ML-% IV SOLN
650.0000 [IU]/h | INTRAVENOUS | Status: AC
  Administered 2022-11-16: 550 [IU]/h via INTRAVENOUS
  Administered 2022-11-18: 650 [IU]/h via INTRAVENOUS
  Filled 2022-11-16 (×2): qty 250

## 2022-11-16 MED ORDER — FUROSEMIDE 10 MG/ML IJ SOLN: 60.0000 mg | Freq: Every day | INTRAMUSCULAR | Status: DC

## 2022-11-16 MED ORDER — METHYLPREDNISOLONE SODIUM SUCC 40 MG IJ SOLR
40.0000 mg | Freq: Every day | INTRAMUSCULAR | Status: DC
  Administered 2022-11-17: 40 mg via INTRAVENOUS
  Filled 2022-11-16: qty 1

## 2022-11-16 NOTE — Progress Notes (Signed)
Pt has been off of BIPAP most of the day maintaining and tolerating 2 lpm nasal cannula.  BIPAP on standby at bedside.

## 2022-11-16 NOTE — TOC Initial Note (Signed)
Transition of Care [facility]) - Initial/Assessment Note    Patient Details  Name: Lisa [name] MRN: [id number] Date of Birth: [birth year]  Transition of Care [facility]) CM/SW Contact:    Karn Cassis, LCSW Phone Number: 11/16/2022, 1:18 PM  Clinical Narrative:  Pt admitted with acute on chronic respiratory failure with hypoxia. Assessment completed due to high risk readmission score. Pt reports she lives alone. Her nephew and niece come by daily for several hours and assist with household chores. She is fairly independent with ADLs. Niece provides transportation to appointments. She plans to return home at d/c. [facility] will continue to follow and assist with d/c planning.                 Expected Discharge Plan: Home/Self Care Barriers to Discharge: Continued Medical Work up   Patient Goals and CMS Choice Patient states their goals for this hospitalization and ongoing recovery are:: return home   Choice offered to / list presented to : Patient [place] ownership interest in [facility].provided to::  (n/a)    Expected Discharge Plan and Services In-house Referral: Clinical Social Work     Living arrangements for the past 2 months: Single Family Home                                      Prior Living Arrangements/Services Living arrangements for the past 2 months: Single Family Home Lives with:: Self Patient language and need for interpreter reviewed:: Yes Do you feel safe going back to the place where you live?: Yes      Need for Family Participation in Patient Care: Yes (Comment) Care giver support system in place?: Yes (comment) Current home services: DME (cane, walker, wheelchair, BSC, shower chair) Criminal Activity/Legal Involvement Pertinent to Current Situation/Hospitalization: No - Comment as needed  Activities of Daily Living Home Assistive Devices/Equipment: Walker (specify type) ADL Screening (condition at time of admission) Patient's  cognitive ability adequate to safely complete daily activities?: No Is the patient deaf or have difficulty hearing?: No Does the patient have difficulty seeing, even when wearing glasses/contacts?: No Does the patient have difficulty concentrating, remembering, or making decisions?: Yes Patient able to express need for assistance with ADLs?: Yes Does the patient have difficulty dressing or bathing?: Yes Independently performs ADLs?: No Communication: Independent Dressing (OT): Needs assistance Is this a change from baseline?: Pre-admission baseline Grooming: Needs assistance Is this a change from baseline?: Pre-admission baseline Feeding: Independent with device (comment) Bathing: Needs assistance Is this a change from baseline?: Pre-admission baseline Toileting: Needs assistance Is this a change from baseline?: Pre-admission baseline In/Out Bed: Needs assistance Is this a change from baseline?: Pre-admission baseline Walks in Home: Needs assistance Is this a change from baseline?: Pre-admission baseline Does the patient have difficulty walking or climbing stairs?: Yes Weakness of Legs: Both Weakness of Arms/Hands: Both  Permission Sought/Granted                  Emotional Assessment     Affect (typically observed): Appropriate Orientation: : Oriented to Self, Oriented to Place, Oriented to  Time, Oriented to Situation Alcohol / Substance Use: Not Applicable Psych Involvement: No (comment)  Admission diagnosis:  Acute respiratory distress [R06.03] Renal insufficiency [N28.9] Acute respiratory failure with hypoxia (HCC) [J96.01] Acute on chronic respiratory failure with hypoxia (HCC) [J96.21] Acute congestive heart failure, unspecified heart failure type (HCC) [I50.9] Patient Active  Problem List   Diagnosis Date Noted   Acute on chronic respiratory failure with hypoxia (HCC) 11/15/2022   Acute respiratory distress 11/15/2022   Need for pneumococcal 20-valent conjugate  vaccination 10/16/2022   Dysmetria 09/04/2022   Encounter for general adult medical examination with abnormal findings 09/04/2022   COVID-19 12/29/2021   COVID-19 virus infection 12/28/2021   Elevated brain natriuretic peptide (BNP) level 12/28/2021   Elevated troponin 12/28/2021   Chronic kidney disease, stage 3b (HCC) 12/28/2021   FTT (failure to thrive) in adult 12/28/2021   Underweight 12/28/2021   Pain and swelling of forearm, left 12/28/2021   COPD with acute exacerbation (HCC) 12/27/2021   Malignant neoplasm of upper lobe of left lung (HCC) 09/15/2021   Adenocarcinoma of left lung (HCC) 09/11/2021   Multinodular goiter 09/05/2021   Odynophagia 09/03/2021   Mass of upper lobe of left lung 09/03/2021   Hypertensive urgency 09/02/2021   Chest pain 09/02/2021   CKD (chronic kidney disease) stage 4, GFR 15-29 ml/min (HCC) 09/02/2021   Tobacco abuse 09/02/2021   Myeloma (HCC) 09/02/2021   Non-ST elevation (NSTEMI) myocardial infarction [facility])    Demand ischemia    Iron deficiency anemia 07/29/2021   Anemia due to stage 4 chronic kidney disease (HCC) 07/22/2021   Chronic hepatitis C (HCC) 07/22/2021   Smoldering multiple myeloma 06/21/2021   Abnormal LFTs (liver function tests)    Calculus of gallbladder without cholecystitis without obstruction    Gallstone pancreatitis 09/13/2018   Protein-calorie malnutrition, severe (HCC) 05/09/2014   Sepsis (HCC) 05/07/2014   Encephalopathy 05/07/2014   Back pain 05/07/2014   Acute renal failure syndrome (HCC)    Bacteremia    COLD (chronic obstructive lung disease) (HCC)    Viridans streptococci infection    H/O goiter 04/10/2014   Moderate protein-calorie malnutrition (HCC) 04/10/2014   Acute encephalopathy 04/09/2014   Streptococcus viridans infection 04/08/2014   Leukocytosis 04/07/2014   Dehydration 04/07/2014   Fever 04/06/2014   UTI (lower urinary tract infection) 04/06/2014   ARF (acute renal failure) (HCC) 04/06/2014    Hypertension    COPD (chronic obstructive pulmonary disease) (HCC)    PCP:  [name], MD Pharmacy:   [facility] [place] [place] - [address] [place] [address] Phone: [phone] Fax: [phone]     Social Determinants of Health (SDOH) Social History: SDOH Screenings   Food Insecurity: No Food Insecurity (11/15/2022)  Housing: Low Risk  (11/15/2022)  Transportation Needs: No Transportation Needs (11/15/2022)  Utilities: Not At Risk (11/15/2022)  Alcohol Screen: Low Risk  (10/24/2022)  Depression (PHQ2-9): Low Risk  (10/24/2022)  Financial Resource Strain: Low Risk  (10/24/2022)  Physical Activity: Inactive (10/24/2022)  Social Connections: Moderately Isolated (10/24/2022)  Stress: No Stress Concern Present (10/24/2022)  Tobacco Use: Medium Risk (11/16/2022)   SDOH Interventions:     Readmission Risk Interventions    11/16/2022    1:13 PM 09/06/2021    9:23 AM  Readmission Risk Prevention Plan  Transportation Screening Complete Complete  PCP or Specialist Appt within 3-5 Days  Complete  HRI or Home Care Consult Complete Complete  Social Work Consult for Recovery Care Planning/Counseling Complete Complete  Palliative Care Screening Complete Not Applicable  Medication Review [employer]) Complete Complete

## 2022-11-16 NOTE — Progress Notes (Signed)
11/15/2022 9:55 PM RN called due to patient complaining of chest pain and having some difficulty with breathing while on BiPAP.  Troponin ordered 692 > 504.  At bedside, patient appears to be having air hunger while on BiPAP, IV morphine was given with improvement.  11/16/2022 6:20 AM RN called due to worsening BNP (612 > 1,659.0 ) Repeated chest x-ray done last night showed no change since prior study earlier in the day. Patient was already diagnosed by day team to likely have acute exacerbation of diastolic heart failure in the setting of severe respiratory distress and was already placed on IV Lasix 20 mg daily. RN was told to follow the order on Lasix administration Patient was in no distress, we shall continue to monitor patient and treat accordingly

## 2022-11-16 NOTE — Consult Note (Signed)
Consultation Note Date: 11/16/2022   Patient Name: Lisa [name]  DOB: [birth year]  MRN: [id number]  Age / Sex: 88 y.o., female  PCP: [name], MD Referring Physician: Cleora Fleet, MD  Reason for Consultation: Establishing goals of care  HPI/Patient Profile: 88 y.o. female  with past medical history of CKD 3, chronic hep C, left lung cancer treated with radiation, COPD former heavy smoker/chronic dyspnea, smoldering myeloma, monoclonal gammopathy of unknown significance, CAD, anemia, dHF, failure to thrive, HTN  admitted on 11/15/2022 with  acute on chronic respiratory failure with hypoxia, acute MI being treated medically .   Clinical Assessment and Goals of Care: I have reviewed medical records including EPIC notes, labs and imaging, received report from RN, assessed the patient.  Mrs. Earlene Plater is sitting up quietly in in bed.  She appears acutely/chronically ill and somewhat frail.  She is alert and oriented x 3, able to make her needs known.  There is no family at bedside at this time.  We meet at the bedside to discuss diagnosis prognosis, GOC, EOL wishes, disposition and options.  I introduced Palliative Medicine as specialized medical care for people living with serious illness. It focuses on providing relief from the symptoms and stress of a serious illness. The goal is to improve quality of life for both the patient and the family.  We discussed a brief life review of the patient.  Mrs. Earlene Plater is a widow, her husband died about 5 years ago.  She moved to [place] at age 12 and worked for 45 years at [employer].  Her nephew, Windy Fast, (her deceased sister's son) lives in her home.   We then focused on their current illness.  We talk about how the heart works.  We talk about her acute MI.  We talk about the treatment plan and options.  Mrs. Earlene Plater tells me that at this point she prefers to treat her  acute heart condition medically.  We talk about the risks for the use of dye and kidney function.  We talk about time for outcomes.  We talk about a few "what if's and maybe's".  The natural disease trajectory and expectations at EOL were discussed.  Advanced directives, concepts specific to code status, artifical feeding and hydration, and rehospitalization were considered and discussed.  Mrs. Earlene Plater endorses DNR.   Discussed the importance of continued conversation with family and the medical providers regarding overall plan of care and treatment options, ensuring decisions are within the context of the patient's values and GOCs.  Questions and concerns were addressed.  The patient was encouraged to call with questions or concerns.  PMT will continue to support holistically.  Conference with attending, cardiologist, bedside nursing staff, transition of care team related to patient condition, needs, goals of care, disposition.    HCPOA  NEXT OF KIN -Mrs. Christell Constant tells me that her niece, [name], is her healthcare surrogate.  She tells me that an event works at a hospital in [place].  SUMMARY OF RECOMMENDATIONS   At this point continue to treat the treatable but no CPR or intubation Would like to medically treat MI at this point Time for outcomes   Code Status/Advance Care Planning: DNR -verified with patient.  Is agreeable to the use of BiPAP and medications.  Symptom Management:  Per hospitalist, no additional needs at this time.  Palliative Prophylaxis:  Frequent Pain Assessment, Oral Care, and Turn Reposition  Additional Recommendations (Limitations, Scope, Preferences): Continue to treatment no CPR or intubation  Psycho-social/Spiritual:  Desire for further Chaplaincy support:no Additional Recommendations: Caregiving  Support/Resources and ICU Family Guide  Prognosis:  Unable to determine, based on outcomes.  Guarded at this point.  Discharge Planning: To Be  Determined      Primary Diagnoses: Present on Admission:  Acute on chronic respiratory failure with hypoxia (HCC)  Acute respiratory distress  FTT (failure to thrive) in adult  Hypertension  Hypertensive urgency  COPD (chronic obstructive pulmonary disease) (HCC)  Protein-calorie malnutrition, severe (HCC)  Smoldering multiple myeloma  Anemia due to stage 4 chronic kidney disease (HCC)  Chronic hepatitis C (HCC)  CKD (chronic kidney disease) stage 4, GFR 15-29 ml/min (HCC)  Mass of upper lobe of left lung  Adenocarcinoma of left lung (HCC)  Malignant neoplasm of upper lobe of left lung (HCC)  COPD with acute exacerbation (HCC)  Elevated brain natriuretic peptide (BNP) level  Underweight  Encephalopathy   I have reviewed the medical record, interviewed the patient and family, and examined the patient. The following aspects are pertinent.  Past Medical History:  Diagnosis Date   Anemia    Carotid artery stenosis    CKD (chronic kidney disease)    COPD (chronic obstructive pulmonary disease) (HCC)    Dyspnea    Due to COPD per patient   Goiter diffuse, nontoxic    Per patient   Hepatitis C    History of radiation therapy    Left lung-10/10/21-10/17/21- Dr. Antony Blackbird   Hypertension    Monoclonal gammopathy of unknown significance (MGUS)    Pyelonephritis 04/06/2014   UTI   Social History   Socioeconomic History   Marital status: Widowed    Spouse name: Not on file   Number of children: 0   Years of education: 38   Highest education level: 12th grade  Occupational History   Not on file  Tobacco Use   Smoking status: Former    Packs/day: .25    Types: Cigarettes    Passive exposure: Current   Smokeless tobacco: Never   Tobacco comments:    Smoking Cessation Classes, Services, Agencies & Resources Offered.  Vaping Use   Vaping Use: Never used  Substance and Sexual Activity   Alcohol use: No   Drug use: No   Sexual activity: Not Currently  Other Topics  Concern   Not on file  Social History Narrative   Not on file   Social Determinants of Health   Financial Resource Strain: Low Risk  (10/24/2022)   Overall Financial Resource Strain (CARDIA)    Difficulty of Paying Living Expenses: Not hard at all  Food Insecurity: No Food Insecurity (11/15/2022)   Hunger Vital Sign    Worried About Running Out of Food in the Last Year: Never true    Ran Out of Food in the Last Year: Never true  Transportation Needs: No Transportation Needs (11/15/2022)   PRAPARE - Administrator, Civil Service (Medical): No    Lack of Transportation (Non-Medical):  No  Physical Activity: Inactive (10/24/2022)   Exercise Vital Sign    Days of Exercise per Week: 0 days    Minutes of Exercise per Session: 0 min  Stress: No Stress Concern Present (10/24/2022)   [employer] of Occupational Health - Occupational Stress Questionnaire    Feeling of Stress : Not at all  Social Connections: Moderately Isolated (10/24/2022)   Social Connection and Isolation Panel [NHANES]    Frequency of Communication with Friends and Family: More than three times a week    Frequency of Social Gatherings with Friends and Family: More than three times a week    Attends Religious Services: More than 4 times per year    Active Member of [employer] or Organizations: No    Attends Banker Meetings: Never    Marital Status: Widowed   Family History  Problem Relation Age of Onset   Hypertension Mother    Thyroid disease Mother    Hypertension Father    Thyroid disease Sister    Thyroid disease Brother    Thyroid disease Sister    Scheduled Meds:  aspirin EC  81 mg Oral q AM   Chlorhexidine Gluconate Cloth  6 each Topical Daily   enoxaparin (LOVENOX) injection  30 mg Subcutaneous Q24H   furosemide  60 mg Intravenous BID   ipratropium-albuterol  3 mL Nebulization Q4H   isosorbide mononitrate  30 mg Oral Daily   methylPREDNISolone (SOLU-MEDROL) injection  60 mg  Intravenous Q12H   metoprolol tartrate  5 mg Intravenous Q6H    morphine injection  2 mg Intravenous Once   Continuous Infusions:  azithromycin Stopped (11/15/22 1833)   cefTRIAXone (ROCEPHIN)  IV     PRN Meds:.hydrALAZINE, ondansetron **OR** ondansetron (ZOFRAN) IV Medications Prior to Admission:  Prior to Admission medications   Medication Sig Start Date End Date Taking? Authorizing Provider  aspirin 81 MG EC tablet Take 81 mg by mouth in the morning.   Yes [provider]  calcium carbonate (OS-CAL - DOSED IN MG OF ELEMENTAL CALCIUM) 1250 (500 Ca) MG tablet Take 1 tablet by mouth daily with breakfast.   Yes [provider]  Cholecalciferol (VITAMIN D3) 125 MCG (5000 UT) CAPS Take 5,000 Units by mouth daily.   Yes [provider]  feeding supplement (ENSURE ENLIVE / ENSURE PLUS) LIQD Take 237 mLs by mouth 2 (two) times daily between meals. 12/31/21  Yes Shah, Pratik D, DO  furosemide (LASIX) 20 MG tablet Take 20 mg by mouth 2 (two) times daily. Morning & afternoon 04/25/21  Yes [provider]  Ipratropium-Albuterol (COMBIVENT RESPIMAT) 20-100 MCG/ACT AERS respimat Inhale 1 puff into the lungs every 6 (six) hours as needed for wheezing or shortness of breath. 12/31/21  Yes Sherryll Burger, Pratik D, DO  ipratropium-albuterol (DUONEB) 0.5-2.5 (3) MG/3ML SOLN Take 3 mLs by nebulization every 4 (four) hours as needed (wheezing/shortness of breath).   Yes [provider]  Iron, Ferrous Sulfate, 325 (65 Fe) MG TABS Take by mouth.   Yes [provider]  nitroGLYCERIN (NITROSTAT) 0.4 MG SL tablet Place 1 tablet (0.4 mg total) under the tongue every 5 (five) minutes as needed for chest pain. 09/06/21 11/15/22 Yes Pokhrel, Laxman, MD  ondansetron (ZOFRAN) 4 MG tablet Take 1 tablet (4 mg total) by mouth every 6 (six) hours as needed for nausea. 09/06/21  Yes Pokhrel, Laxman, MD  telmisartan (MICARDIS) 20 MG tablet Take 20 mg by mouth daily. 06/23/22 06/23/23 Yes  [provider]  amLODipine (NORVASC) 2.5 MG tablet Take 2.5 mg by mouth daily. Patient not taking: Reported on 11/15/2022 06/23/22   [provider]  carvedilol (COREG) 25 MG tablet Take 1 tablet (25 mg total) by mouth 2 (two) times daily with a meal. Patient not taking: Reported on 11/15/2022 09/06/21 10/16/22  Joycelyn Das, MD  isosorbide mononitrate (IMDUR) 30 MG 24 hr tablet Take 1 tablet (30 mg total) by mouth daily. Patient not taking: Reported on 11/15/2022 09/06/21 11/15/22  Joycelyn Das, MD   No Known Allergies Review of Systems  Physical Exam Vitals and nursing note reviewed.     Vital Signs: BP (!) 163/60   Pulse 79   Temp 98.2 F (36.8 C) (Axillary)   Resp 18   Wt 47.1 kg   SpO2 100%   BMI 16.26 kg/m  Pain Scale: 0-10   Pain Score: 0-No pain   SpO2: SpO2: 100 % O2 Device:SpO2: 100 % O2 Flow Rate: .O2 Flow Rate (L/min): 3 L/min  IO: Intake/output summary:  Intake/Output Summary (Last 24 hours) at 11/16/2022 0947 Last data filed at 11/16/2022 0400 Gross per 24 hour  Intake 250 ml  Output 600 ml  Net -350 ml    LBM:   Baseline Weight: Weight: 47.1 kg Most recent weight: Weight: 47.1 kg     Palliative Assessment/Data:     Time In: 0830  Time Out: 0945 Time Total: 75 minutes  Greater than 50%  of this time was spent counseling and coordinating care related to the above assessment and plan.  Signed by: Katheran Awe, NP   Please contact Palliative Medicine Team phone at [phone] for questions and concerns.  For individual provider: See Loretha Stapler

## 2022-11-16 NOTE — Consult Note (Signed)
Cardiology Consultation   Patient ID: Lisa [name] MRN: [id number]; DOB: 08-15-1934  Admit date: 11/15/2022 Date of Consult: 11/16/2022  PCP:  [name], MD   [facility] Providers Cardiologist:  Little Ishikawa, MD        Patient Profile:   Lisa [name] is a 88 y.o. female with a hx of elevated troponin values (occurring dyring admission in 08/2021 following bronchoscopy and medical management pursued given Stage 4 CKD), SVT, HTN, Stage 4 CKD, COPD, adenocarcinoma of left lung (s/p radiation) and MGUS who is being seen 11/16/2022 for the evaluation of chest pain and acute CHF at the request of Dr. Laural Benes.  History of Present Illness:   Lisa [name] presented to [facility] ED on 11/15/2022 for evaluation of worsening dyspnea and oxygen saturations had been at 85% on room air per EMS. She also reported having chest pain and tightness which was relieved with placement of oxygen.   BP was significantly elevated at 268/114 upon arrival. Initial labs showed WBC 4.9, Hgb 9.2 (close to baseline, platelets 91 K, Na+ 135, K+ 3.9 and creatinine 1.80. AST 35 and ALT 23.  BNP elevated to 612. Lactic acid 1.4. Negative for COVID and Influenza. CXR showed a large left apical nodular density concerning for recurrent malignancy. CT Chest showed a 5.3 x 2.6 cm oval area of consolidation and overall compatible with postsurgical and postradiation changes. Was noted to have a stable large thyroid goiter and coronary artery calcifications and aortic atherosclerosis along with COPD. VQ scan attempted but could not be completed.  EKG showed normal sinus rhythm, rate 82 with LVH and ST depression along the inferior and lateral leads which is similar to prior tracings and likely due to repolarization abnormalities.  She was admitted for further management of acute hypoxic respiratory failure and goals of care discussions were held with the patient's family and she was made DNR. She  has required continuous BiPAP therapy. Overnight, she reported chest pain and appeared restless and agitated.  Follow-up labs showed BNP had further increased to 1659. Initial and repeat Hs Troponin at 692 and 504. ABG showed pH of 7.43 with pCO2 normal at 40. Was given IV Morphine along with 20mg  IV Lasix.   In talking with the patient today, she is currently on BiPAP but is AxOx3. Says that her main symptoms prior to admission were weakness and shortness of breath. She would use her inhalers with improvement in her symptoms. Reports having some chest discomfort when she could not catch her breath but this would resolve with use of her inhalers as well. She does live by herself but reports having family members that check on her routinely. She did have worsening shortness of breath and chest discomfort overnight but reports this resolved with the medications administered at that time and she denies any symptoms currently.   Past Medical History:  Diagnosis Date   Anemia    Carotid artery stenosis    CKD (chronic kidney disease)    COPD (chronic obstructive pulmonary disease) (HCC)    Dyspnea    Due to COPD per patient   Goiter diffuse, nontoxic    Per patient   Hepatitis C    History of radiation therapy    Left lung-10/10/21-10/17/21- Dr. Antony Blackbird   Hypertension    Monoclonal gammopathy of unknown significance (MGUS)    Pyelonephritis 04/06/2014   UTI    Past Surgical History:  Procedure Laterality Date  ABDOMINAL HYSTERECTOMY     CHOLECYSTECTOMY N/A 09/17/2018   Procedure: LAPAROSCOPIC CHOLECYSTECTOMY;  Surgeon: Lucretia Roers, MD;  Location: [facility] ORS;  Service: General;  Laterality: N/A;   FUDUCIAL PLACEMENT Left 09/01/2021   Procedure: PLACEMENT OF FIDUCIAL MARKERS TIMES FOUR;  Surgeon: Loreli Slot, MD;  Location: [facility] OR;  Service: Thoracic;  Laterality: Left;   TEE WITHOUT CARDIOVERSION N/A 04/10/2014   Procedure: TRANSESOPHAGEAL ECHOCARDIOGRAM (TEE);  Surgeon: Donato Schultz, MD;  Location: [facility] ENDOSCOPY;  Service: Cardiovascular;  Laterality: N/A;   VIDEO BRONCHOSCOPY WITH ENDOBRONCHIAL NAVIGATION N/A 09/01/2021   Procedure: VIDEO BRONCHOSCOPY WITH ENDOBRONCHIAL NAVIGATION;  Surgeon: Loreli Slot, MD;  Location: [facility] OR;  Service: Thoracic;  Laterality: N/A;     Home Medications:  Prior to Admission medications   Medication Sig Start Date End Date Taking? Authorizing Provider  aspirin 81 MG EC tablet Take 81 mg by mouth in the morning.   Yes [provider]  calcium carbonate (OS-CAL - DOSED IN MG OF ELEMENTAL CALCIUM) 1250 (500 Ca) MG tablet Take 1 tablet by mouth daily with breakfast.   Yes [provider]  Cholecalciferol (VITAMIN D3) 125 MCG (5000 UT) CAPS Take 5,000 Units by mouth daily.   Yes [provider]  feeding supplement (ENSURE ENLIVE / ENSURE PLUS) LIQD Take 237 mLs by mouth 2 (two) times daily between meals. 12/31/21  Yes Shah, Pratik D, DO  furosemide (LASIX) 20 MG tablet Take 20 mg by mouth 2 (two) times daily. Morning & afternoon 04/25/21  Yes [provider]  Ipratropium-Albuterol (COMBIVENT RESPIMAT) 20-100 MCG/ACT AERS respimat Inhale 1 puff into the lungs every 6 (six) hours as needed for wheezing or shortness of breath. 12/31/21  Yes Sherryll Burger, Pratik D, DO  ipratropium-albuterol (DUONEB) 0.5-2.5 (3) MG/3ML SOLN Take 3 mLs by nebulization every 4 (four) hours as needed (wheezing/shortness of breath).   Yes [provider]  Iron, Ferrous Sulfate, 325 (65 Fe) MG TABS Take by mouth.   Yes [provider]  nitroGLYCERIN (NITROSTAT) 0.4 MG SL tablet Place 1 tablet (0.4 mg total) under the tongue every 5 (five) minutes as needed for chest pain. 09/06/21 11/15/22 Yes Pokhrel, Laxman, MD  ondansetron (ZOFRAN) 4 MG tablet Take 1 tablet (4 mg total) by mouth every 6 (six) hours as needed for nausea. 09/06/21  Yes Pokhrel, Laxman, MD  telmisartan (MICARDIS) 20 MG tablet Take 20 mg by mouth daily.  06/23/22 06/23/23 Yes [provider]  amLODipine (NORVASC) 2.5 MG tablet Take 2.5 mg by mouth daily. Patient not taking: Reported on 11/15/2022 06/23/22   [provider]  carvedilol (COREG) 25 MG tablet Take 1 tablet (25 mg total) by mouth 2 (two) times daily with a meal. Patient not taking: Reported on 11/15/2022 09/06/21 10/16/22  Joycelyn Das, MD  isosorbide mononitrate (IMDUR) 30 MG 24 hr tablet Take 1 tablet (30 mg total) by mouth daily. Patient not taking: Reported on 11/15/2022 09/06/21 11/15/22  Joycelyn Das, MD    Inpatient Medications: Scheduled Meds:  aspirin EC  81 mg Oral q AM   Chlorhexidine Gluconate Cloth  6 each Topical Daily   enoxaparin (LOVENOX) injection  30 mg Subcutaneous Q24H   furosemide  20 mg Intravenous Daily   ipratropium-albuterol  3 mL Nebulization Q4H   isosorbide mononitrate  30 mg Oral Daily   methylPREDNISolone (SOLU-MEDROL) injection  60 mg Intravenous Q12H   metoprolol tartrate  5 mg Intravenous Q6H    morphine injection  2 mg Intravenous Once  Continuous Infusions:  azithromycin Stopped (11/15/22 1833)   cefTRIAXone (ROCEPHIN)  IV     PRN Meds: hydrALAZINE, ondansetron **OR** ondansetron (ZOFRAN) IV  Allergies:   No Known Allergies  Social History:   Social History   Socioeconomic History   Marital status: Widowed    Spouse name: Not on file   Number of children: 0   Years of education: 4   Highest education level: 12th grade  Occupational History   Not on file  Tobacco Use   Smoking status: Former    Packs/day: .25    Types: Cigarettes    Passive exposure: Current   Smokeless tobacco: Never   Tobacco comments:    Smoking Cessation Classes, Services, Agencies & Resources Offered.  Vaping Use   Vaping Use: Never used  Substance and Sexual Activity   Alcohol use: No   Drug use: No   Sexual activity: Not Currently  Other Topics Concern   Not on file  Social History Narrative   Not on file   Social Determinants  of Health   Financial Resource Strain: Low Risk  (10/24/2022)   Overall Financial Resource Strain (CARDIA)    Difficulty of Paying Living Expenses: Not hard at all  Food Insecurity: No Food Insecurity (11/15/2022)   Hunger Vital Sign    Worried About Running Out of Food in the Last Year: Never true    Ran Out of Food in the Last Year: Never true  Transportation Needs: No Transportation Needs (11/15/2022)   PRAPARE - Administrator, Civil Service (Medical): No    Lack of Transportation (Non-Medical): No  Physical Activity: Inactive (10/24/2022)   Exercise Vital Sign    Days of Exercise per Week: 0 days    Minutes of Exercise per Session: 0 min  Stress: No Stress Concern Present (10/24/2022)   [employer] of Occupational Health - Occupational Stress Questionnaire    Feeling of Stress : Not at all  Social Connections: Moderately Isolated (10/24/2022)   Social Connection and Isolation Panel [NHANES]    Frequency of Communication with Friends and Family: More than three times a week    Frequency of Social Gatherings with Friends and Family: More than three times a week    Attends Religious Services: More than 4 times per year    Active Member of [employer] or Organizations: No    Attends Banker Meetings: Never    Marital Status: Widowed  Intimate Partner Violence: Not At Risk (11/15/2022)   Humiliation, Afraid, Rape, and Kick questionnaire    Fear of Current or Ex-Partner: No    Emotionally Abused: No    Physically Abused: No    Sexually Abused: No    Family History:    Family History  Problem Relation Age of Onset   Hypertension Mother    Thyroid disease Mother    Hypertension Father    Thyroid disease Sister    Thyroid disease Brother    Thyroid disease Sister      ROS:  Please see the history of present illness.   All other ROS reviewed and negative.     Physical Exam/Data:   Vitals:   11/16/22 0600 11/16/22 0630 11/16/22 0700 11/16/22 0755  BP:  (!) 141/53 (!) 157/69 (!) 163/60   Pulse:      Resp: 15 17 18    Temp:      TempSrc:      SpO2: 100%   100%  Weight:  Intake/Output Summary (Last 24 hours) at 11/16/2022 0811 Last data filed at 11/16/2022 0400 Gross per 24 hour  Intake 250 ml  Output 600 ml  Net -350 ml      11/15/2022    5:12 PM 10/16/2022   10:48 AM 09/04/2022   10:47 AM  Last 3 Weights  Weight (lbs) 103 lb 13.4 oz 104 lb 3.2 oz 105 lb  Weight (kg) 47.1 kg 47.265 kg 47.628 kg     Body mass index is 16.26 kg/m.  General:  Pleasant, elderly female and currently on BiPAP.  HEENT: normal Neck: JVD at 10-11 cm but difficult to assess given patient's position.  Vascular: No carotid bruits; Distal pulses 2+ bilaterally Cardiac:  normal S1, S2; RRR; no murmur  Lungs: rales along bases bilaterally.  Abd: soft, nontender, no hepatomegaly  Ext: no pitting edema Musculoskeletal:  No deformities, BUE and BLE strength normal and equal Skin: warm and dry  Neuro:  CNs 2-12 intact, no focal abnormalities noted Psych:  Normal affect   EKG:  The EKG was personally reviewed and demonstrates: NSR, HR 82 with LVH and ST depression along the inferior and lateral leads which is similar to prior tracings and likely due to repolarization abnormalities.  Telemetry:  Telemetry was personally reviewed and demonstrates:  NSR, HR in 60's to 70's with frequent PVC's. Episodes of brief, narrow-complex tachycardia and lasting for less than 5 seconds. Appears most consistent with SVT.   Relevant CV Studies:  Echocardiogram: 12/2021 IMPRESSIONS     1. Left ventricular ejection fraction, by estimation, is 65 to 70%. The  left ventricle has normal function. The left ventricle has no regional  wall motion abnormalities. Left ventricular diastolic parameters are  indeterminate.   2. Right ventricular systolic function is normal. The right ventricular  size is normal. There is normal pulmonary artery systolic pressure.   3. Left atrial  size was mild to moderately dilated.   4. The mitral valve is normal in structure. Trivial mitral valve  regurgitation. No evidence of mitral stenosis.   5. The tricuspid valve is abnormal.   6. The aortic valve has an indeterminant number of cusps. There is mild  calcification of the aortic valve. There is mild thickening of the aortic  valve. Aortic valve regurgitation is not visualized. No aortic stenosis is  present.   Laboratory Data:  High Sensitivity Troponin:   Recent Labs  Lab 11/15/22 2213 11/16/22 0003  TROPONINIHS 692* 504*     Chemistry Recent Labs  Lab 11/15/22 0805 11/16/22 0421  NA 135 138  K 3.9 4.1  CL 103 103  CO2 25 23  GLUCOSE 152* 122*  BUN 65* 73*  CREATININE 1.80* 1.86*  CALCIUM 8.7* 8.7*  MG  --  2.0  GFRNONAA 27* 26*  ANIONGAP 7 12    Recent Labs  Lab 11/15/22 0805  PROT 9.7*  ALBUMIN 3.1*  AST 35  ALT 23  ALKPHOS 59  BILITOT 0.4   Lipids No results for input(s): "CHOL", "TRIG", "HDL", "LABVLDL", "LDLCALC", "CHOLHDL" in the last 168 hours.  Hematology Recent Labs  Lab 11/15/22 0805 11/16/22 0421  WBC 4.9 3.4*  RBC 2.94* 2.87*  HGB 9.2* 9.0*  HCT 28.6* 27.9*  MCV 97.3 97.2  MCH 31.3 31.4  MCHC 32.2 32.3  RDW 13.0 12.9  PLT 91* 102*   Thyroid No results for input(s): "TSH", "FREET4" in the last 168 hours.  BNP Recent Labs  Lab 11/15/22 0805 11/16/22 0421  BNP 612.0*  1,659.0*    DDimer No results for input(s): "DDIMER" in the last 168 hours.   Radiology/Studies:  DG Chest 1 View  Result Date: 11/15/2022 CLINICAL DATA:  Shortness of breath. Acute on chronic respiratory failure. History of COPD and adenocarcinoma of left lung. EXAM: CHEST  1 VIEW COMPARISON:  11/15/2022 FINDINGS: Heart size and pulmonary vascularity are normal. Peribronchial thickening and central interstitial changes consistent with chronic bronchitis. Masslike areas consolidation surrounding surgical clips corresponds to previous neoplasm and treatment  changes. No new or developing consolidation or atelectasis. No pleural effusions. No pneumothorax. Calcification of the aorta. IMPRESSION: Chronic bronchitic changes in the lungs. Left upper lobe area of masslike consolidation with surgical clips likely corresponds to known neoplasm and post treatment changes. No change since prior study earlier today. Electronically Signed   By: Burman Nieves M.D.   On: 11/15/2022 23:29   CT Chest Wo Contrast  Result Date: 11/15/2022 CLINICAL DATA:  Shortness of breath. History of lung cancer. Left apical nodular density on a portable chest radiograph today with an appearance suspicious for recurrent non-small-cell lung cancer. Chest CT was recommended for further evaluation. Status post radiation therapy. EXAM: CT CHEST WITHOUT CONTRAST TECHNIQUE: Multidetector CT imaging of the chest was performed following the standard protocol without IV contrast. RADIATION DOSE REDUCTION: This exam was performed according to the departmental dose-optimization program which includes automated exposure control, adjustment of the mA and/or kV according to patient size and/or use of iterative reconstruction technique. COMPARISON:  Chest radiographs dated 11/15/2022 and 12/29/2021. Chest CT dated 05/31/2022. FINDINGS: Cardiovascular: Atheromatous calcifications, including the coronary arteries and aorta. Normal sized heart. No pericardial effusion. Mediastinum/Nodes: Marked diffuse enlargement of the thyroid gland with diffuse heterogeneity and calcifications and substernal extension. This is causing mild tracheal narrowing without significant change. No enlarged lymph nodes. The previously demonstrated 9 mm short axis AP window node measures 8 mm in short axis diameter on image number 64/2. Unremarkable esophagus. Lungs/Pleura: Multiple surgical clips in an oval area of consolidation and adjacent prominent interstitial markings in the left upper lobe, containing multiple interval surgical  clips. This area measures 5.3 x 2.6 cm on image number 35/4 and corresponds to the mass-like density seen the portable chest obtained earlier today. This extends posteriorly to the major fissure with associated pleural thickening. The previously demonstrated left upper lobe nodule in this area is no longer visualized. No new lung nodules and no pleural fluid. The lungs remain hyperexpanded. Upper Abdomen: Extensive atheromatous arterial calcifications. Cholecystectomy clips. Musculoskeletal: Lower cervical spine degenerative changes and mild thoracic spine degenerative changes. IMPRESSION: 1. 5.3 x 2.6 cm oval area of consolidation and adjacent prominent interstitial markings in the left upper lobe, corresponding to the mass-like density seen on the portable chest obtained earlier today. This is compatible with postsurgical and post radiation changes. 2. The previously demonstrated left upper lobe nodule in this area is no longer visualized, compatible with interval resection. 3. No enlarged lymph nodes. 4. Stable large thyroid goiter causing mild tracheal narrowing. This has been previously evaluated with imaging. 5. Calcific coronary artery and aortic atherosclerosis. 6. Stable changes of COPD. Aortic Atherosclerosis (ICD10-I70.0) and Emphysema (ICD10-J43.9). Electronically Signed   By: Beckie Salts M.D.   On: 11/15/2022 10:53   DG Chest Port 1 View  Result Date: 11/15/2022 CLINICAL DATA:  Shortness of breath, history of lung cancer. EXAM: PORTABLE CHEST 1 VIEW COMPARISON:  May 29, 2022.  December 29, 2021. FINDINGS: The heart size and mediastinal contours are within normal  limits. There is noted large left apical nodular density concerning for malignancy. Surgical clips are seen in this area. Right lung is unremarkable. The visualized skeletal structures are unremarkable. IMPRESSION: Large left apical nodular density is noted concerning for recurrent malignancy. CT scan of the chest is recommended for further  evaluation. Electronically Signed   By: Lupita Raider M.D.   On: 11/15/2022 08:18     Assessment and Plan:   1. Acute HFpEF - Presented with worsening fatigue and dyspnea, found to have a CHF exacerbation with BNP initially at 612 and repeat values elevated to 1659 overnight. She did receive fluids upon admission but these were discontinued. - She is still requiring BiPAP but by review of notes, she appears significantly improved as compared to the time of admission. She is currently scheduled to receive IV Lasix 20 mg but given her renal dysfunction, will dose 60 mg twice daily and assess response. Follow I&O's along with daily weights. Will obtain a follow-up echocardiogram for reassessment of EF and wall motion. Would not use an SGLT2 inhibitor given her variable PO intake and failure to thrive with BMI at 16.  2. Elevated Troponin/Chest Pain - She reports having chest pain last night but this improved with administration of Morphine and Lasix. Feels back to baseline this morning. Troponin values were elevated to 692 and 504. Suspect this is secondary to demand ischemia in the setting of her acute CHF exacerbation and hypertensive urgency given BP of 268/114 upon arrival. Will review with Dr. Jenene Slicker in regards to starting Heparin for medical management for at least 48 hours. As discussed during prior hospitalizations, she is not an ideal candidate for a cardiac catheterization given her CKD. Will obtain an echocardiogram to assess for any structural abnormalities. PO medications currently held given the use of BiPAP. Continue IV Lopressor. Plan to restart ASA and Imdur once taking PO medications. Not currently on statin therapy. Will check an FLP.   3. Acute on Chronic Hypoxic Respiratory Failure/COPD Exacerbation - Suspect this is multifactorial in the setting of CHF exacerbation and COPD exacerbation. Will diurese as discussed above. Management of COPD per the admitting team.  4.  Hypertensive Urgency - Her BP was significantly elevated at 268/114 upon admission, improved to 163/60 on most recent check. She has been receiving scheduled IV Lopressor and PRN Hydralazine. Restart PO medications once off BiPAP.  5. Stage 4 CKD - Followed by Dr. Wolfgang Phoenix as an outpatient. Baseline creatinine ~ 1.4. At 1.80 on admission. Follow with diuresis.   6. SVT - K+ at 4.1 today and Mg 2.0. She is on Coreg 25 mg twice daily as an outpatient but not ordered on admission given the use of BiPAP. Currently receiving IV Lopressor 5 mg every 6 hours.  For questions or updates, please contact [facility] Please consult [email or web address] for contact info under    Signed, Ellsworth Lennox, PA-C  11/16/2022 8:11 AM

## 2022-11-16 NOTE — Progress Notes (Signed)
ANTICOAGULATION CONSULT NOTE - Initial Consult  Pharmacy Consult for heparin Indication: chest pain/ACS  No Known Allergies  Patient Measurements: Weight: 47.1 kg (103 lb 13.4 oz) Heparin Dosing Weight: 47 kg  Vital Signs: Temp: 98.5 F (36.9 C) (06/06 1604) Temp Source: Oral (06/06 1604) BP: 173/70 (06/06 1229) Pulse Rate: 79 (06/06 0908)  Labs: Recent Labs    11/15/22 0805 11/15/22 2213 11/16/22 0003 11/16/22 0421 11/16/22 0939  HGB 9.2*  --   --  9.0*  --   HCT 28.6*  --   --  27.9*  --   PLT 91*  --   --  102*  --   CREATININE 1.80*  --   --  1.86*  --   TROPONINIHS  --  692* 504*  --  12,379*    Estimated Creatinine Clearance: 16.4 mL/min (A) (by C-G formula based on SCr of 1.86 mg/dL (H)).   Medical History: Past Medical History:  Diagnosis Date   Anemia    Carotid artery stenosis    CKD (chronic kidney disease)    COPD (chronic obstructive pulmonary disease) (HCC)    Dyspnea    Due to COPD per patient   Goiter diffuse, nontoxic    Per patient   Hepatitis C    History of radiation therapy    Left lung-10/10/21-10/17/21- Dr. Antony Blackbird   Hypertension    Monoclonal gammopathy of unknown significance (MGUS)    Pyelonephritis 04/06/2014   UTI    Medications:  Medications Prior to Admission  Medication Sig Dispense Refill Last Dose   aspirin 81 MG EC tablet Take 81 mg by mouth in the morning.   11/14/2022   calcium carbonate (OS-CAL - DOSED IN MG OF ELEMENTAL CALCIUM) 1250 (500 Ca) MG tablet Take 1 tablet by mouth daily with breakfast.   11/14/2022   Cholecalciferol (VITAMIN D3) 125 MCG (5000 UT) CAPS Take 5,000 Units by mouth daily.   11/14/2022   feeding supplement (ENSURE ENLIVE / ENSURE PLUS) LIQD Take 237 mLs by mouth 2 (two) times daily between meals. 237 mL 12 Past Week   furosemide (LASIX) 20 MG tablet Take 20 mg by mouth 2 (two) times daily. Morning & afternoon   11/14/2022   Ipratropium-Albuterol (COMBIVENT RESPIMAT) 20-100 MCG/ACT AERS respimat  Inhale 1 puff into the lungs every 6 (six) hours as needed for wheezing or shortness of breath. 4 g 2 11/14/2022   ipratropium-albuterol (DUONEB) 0.5-2.5 (3) MG/3ML SOLN Take 3 mLs by nebulization every 4 (four) hours as needed (wheezing/shortness of breath).   11/14/2022   Iron, Ferrous Sulfate, 325 (65 Fe) MG TABS Take by mouth.   11/14/2022   nitroGLYCERIN (NITROSTAT) 0.4 MG SL tablet Place 1 tablet (0.4 mg total) under the tongue every 5 (five) minutes as needed for chest pain. 100 tablet 1 unknown   ondansetron (ZOFRAN) 4 MG tablet Take 1 tablet (4 mg total) by mouth every 6 (six) hours as needed for nausea. 20 tablet 0 unknown   telmisartan (MICARDIS) 20 MG tablet Take 20 mg by mouth daily.   11/14/2022   amLODipine (NORVASC) 2.5 MG tablet Take 2.5 mg by mouth daily. (Patient not taking: Reported on 11/15/2022)   Not Taking   carvedilol (COREG) 25 MG tablet Take 1 tablet (25 mg total) by mouth 2 (two) times daily with a meal. (Patient not taking: Reported on 11/15/2022) 60 tablet 2 Not Taking   isosorbide mononitrate (IMDUR) 30 MG 24 hr tablet Take 1 tablet (30 mg total) by  mouth daily. (Patient not taking: Reported on 11/15/2022) 30 tablet 2 Not Taking    Assessment: Pharmacy consulted to dose heparin in patient with NSTEMI.  Patient is not on anticoagulation prior to admission but received lovenox 6/5 @ 2223.  Hgb 9.0 Trop 12,379  Goal of Therapy:  Heparin level 0.3-0.7 units/ml Monitor platelets by anticoagulation protocol: Yes   Plan:  Give 2800 units bolus x 1 Start heparin infusion at 550 units/hr Check anti-Xa level in 8 hours and daily while on heparin Continue to monitor H&H and platelets  Judeth Cornfield, PharmD Clinical Pharmacist 11/16/2022 4:58 PM

## 2022-11-16 NOTE — Progress Notes (Signed)
PROGRESS NOTE   Lisa [name]  ZOX:[id number] DOB: 08/04/1934 DOA: 11/15/2022 PCP: [name], MD   Chief Complaint  Patient presents with   Shortness of Breath   Level of care: Stepdown  Brief Admission History:  88 year old female with stage IIIb CKD, chronic hepatitis C, former heavy tobacco user, hypertension, COPD, malignant neoplasm of left lung, treated with radiation, smoldering myeloma, goiter, monoclonal gammopathy of unknown significance, chronic dyspnea, coronary artery disease, anemia and CKD, diastolic heart failure with failure to thrive presented to the emergency department by EMS with acute onset of severe shortness of breath.  She has had progressive worsening shortness of breath since yesterday.  She started showing more signs of respiratory stridor and grunting and EMS noted pulse ox 85% on room air.  She complained of chest tightness and chest pain.  She is not normally on supplemental oxygen but was placed on nasal cannula en route.  Patient was severely hypertensive on arrival with systolic blood pressure of 238.  She was given albuterol bronchodilator treatment and IV steroids.  Her chest x-ray and CT chest showed chronic findings.  Unfortunately her respiratory distress progressed and she required continuous BiPAP therapy.  She was not able to have IV contrast for CTA chest and VQ scan not completed.  She is being admitted for acute respiratory distress presumably secondary to his COPD exacerbation.  After discussion with her healthcare surrogate decision made to make patient DNR but continue BiPAP treatments and stepdown ICU care.  Influenza and COVID testing negative.   Assessment and Plan:  Acute on chronic respiratory failure with hypoxia -multifactorial causes including acute heart failure and acute MI  -Patient presents with acute severe respiratory distress requiring continuous BiPAP therapy -Goals of care discussion with healthcare surrogate patient made  DNR -Continue aggressive IV steroids and scheduled bronchodilators -Continue supplemental oxygen -when able to take p.o. will add mucolytics and cough suppressants -follow for clinical improvement    Acute Encephalopathy - resolved  -secondary to hypercarbia related to acute respiratory failure -mentation is improving on bipap -ABG reassuring   NSTEMI -appreciate cardiology consultation and recommendations -treating medically per ACS protocol per Dr. Jenene Slicker -goals of care discussion with palliative medicine team ongoing  -awaiting goals of care discussion regarding desire for further invasive procedures   COPD with acute exacerbation  -continue IV methylprednisolone but weaning dose as respiratory distress improves -continue IV antibiotics -continue scheduled bronchodilators    Severe protein calorie malnutrition -pt is severely emaciated -will request dietitian consultation when more medically stable   Adult Failure to thrive  -IPAL meeting for goals of care discussion with brother - healthcare decision maker -after discussions, decision made to make DNR  -if not improving with aggressive treatments would get a palliative medicine consultation   Stage IV CKD  -monitor Cr daily with diuresis -renally dose medication    Elevated BNP -likely acute exacerbation of diastolic heart failure in setting of severe respiratory distress -IV lasix by cardiology team  Intake/Output Summary (Last 24 hours) at 11/16/2022 1259 Last data filed at 11/16/2022 0400 Gross per 24 hour  Intake 250 ml  Output 600 ml  Net -350 ml   Filed Weights   11/15/22 1712  Weight: 47.1 kg     Hypertensive Urgency  -severely elevated BPs likely related to acute respiratory failure -Pt presented with severe respiratory distress  -IV hydralazine ordered, IV metoprolol ordered -holding home oral meds in setting of NPO status    Adenocarcinoma / Mass  of Left upper lung -s/p radiation treatments by  Dr. Roselind Messier 5/1-10/17/2021 -CT and CXR show stable findings likely residual scarring -Follow up with Dr. Ellin Saba for ongoing surveillance as scheduled   MGUS / smoldering multiple myeloma - she is being followed for this by Dr. Ellin Saba    DVT prophylaxis: IV heparin infusion  Code Status: DNR  Family Communication: brother  Disposition: Status is: Inpatient Remains inpatient appropriate because: intensity    Consultants:  Cardiology Palliative medicine  Procedures:   Antimicrobials:    Subjective: Pt had acute severe chest pain episode overnight, she is chest pain free this morning.   Objective: Vitals:   11/16/22 1136 11/16/22 1200 11/16/22 1213 11/16/22 1229  BP:  (!) 189/73  (!) 173/70  Pulse:      Resp: (!) 22 18  16   Temp: 98.7 F (37.1 C)     TempSrc: Oral     SpO2:  100% 100% 100%  Weight:        Intake/Output Summary (Last 24 hours) at 11/16/2022 1253 Last data filed at 11/16/2022 0400 Gross per 24 hour  Intake 250 ml  Output 600 ml  Net -350 ml   Filed Weights   11/15/22 1712  Weight: 47.1 kg   Examination:  General exam: Appears acutely and chronically ill on bipap, able to mentate better,  calm and comfortable  Respiratory system: better air movement, moderate increased work of breathing with tachypnea on bipap.  Cardiovascular system: normal S1 & S2 heard. No JVD, murmurs, rubs, gallops or clicks. No pedal edema. Gastrointestinal system: Abdomen is nondistended, soft and nontender. No organomegaly or masses felt. Normal bowel sounds heard. Central nervous system: Alert and oriented. No focal neurological deficits. Extremities: Symmetric 5 x 5 power. Skin: No rashes, lesions or ulcers. Psychiatry: Judgement and insight appear normal. Mood & affect appropriate.   Data Reviewed: I have personally reviewed following labs and imaging studies  CBC: Recent Labs  Lab 11/15/22 0805 11/16/22 0421  WBC 4.9 3.4*  NEUTROABS 3.3 2.9  HGB 9.2* 9.0*  HCT  28.6* 27.9*  MCV 97.3 97.2  PLT 91* 102*    Basic Metabolic Panel: Recent Labs  Lab 11/15/22 0805 11/16/22 0421  NA 135 138  K 3.9 4.1  CL 103 103  CO2 25 23  GLUCOSE 152* 122*  BUN 65* 73*  CREATININE 1.80* 1.86*  CALCIUM 8.7* 8.7*  MG  --  2.0    CBG: No results for input(s): "GLUCAP" in the last 168 hours.  Recent Results (from the past 240 hour(s))  Resp panel by RT-PCR (RSV, Flu A&B, Covid) Anterior Nasal Swab     Status: None   Collection Time: 11/15/22  8:12 AM   Specimen: Anterior Nasal Swab  Result Value Ref Range Status   SARS Coronavirus 2 by RT PCR NEGATIVE NEGATIVE Final    Comment: (NOTE) SARS-CoV-2 target nucleic acids are NOT DETECTED.  The SARS-CoV-2 RNA is generally detectable in upper respiratory specimens during the acute phase of infection. The lowest concentration of SARS-CoV-2 viral copies this assay can detect is 138 copies/mL. A negative result does not preclude SARS-Cov-2 infection and should not be used as the sole basis for treatment or other patient management decisions. A negative result may occur with  improper specimen collection/handling, submission of specimen other than nasopharyngeal swab, presence of viral mutation(s) within the areas targeted by this assay, and inadequate number of viral copies(<138 copies/mL). A negative result must be combined with clinical observations, patient history,  and epidemiological information. The expected result is Negative.  Fact Sheet for Patients:  BloggerCourse.com  Fact Sheet for Healthcare Providers:  SeriousBroker.it  This test is no t yet approved or cleared by the [place] FDA and  has been authorized for detection and/or diagnosis of SARS-CoV-2 by FDA under an Emergency Use Authorization (EUA). This EUA will remain  in effect (meaning this test can be used) for the duration of the COVID-19 declaration under Section 564(b)(1) of the  Act, 21 U.S.C.section 360bbb-3(b)(1), unless the authorization is terminated  or revoked sooner.       Influenza A by PCR NEGATIVE NEGATIVE Final   Influenza B by PCR NEGATIVE NEGATIVE Final    Comment: (NOTE) The Xpert Xpress SARS-CoV-2/FLU/RSV plus assay is intended as an aid in the diagnosis of influenza from Nasopharyngeal swab specimens and should not be used as a sole basis for treatment. Nasal washings and aspirates are unacceptable for Xpert Xpress SARS-CoV-2/FLU/RSV testing.  Fact Sheet for Patients: BloggerCourse.com  Fact Sheet for Healthcare Providers: SeriousBroker.it  This test is not yet approved or cleared by the [place] FDA and has been authorized for detection and/or diagnosis of SARS-CoV-2 by FDA under an Emergency Use Authorization (EUA). This EUA will remain in effect (meaning this test can be used) for the duration of the COVID-19 declaration under Section 564(b)(1) of the Act, 21 U.S.C. section 360bbb-3(b)(1), unless the authorization is terminated or revoked.     Resp Syncytial Virus by PCR NEGATIVE NEGATIVE Final    Comment: (NOTE) Fact Sheet for Patients: BloggerCourse.com  Fact Sheet for Healthcare Providers: SeriousBroker.it  This test is not yet approved or cleared by the [place] FDA and has been authorized for detection and/or diagnosis of SARS-CoV-2 by FDA under an Emergency Use Authorization (EUA). This EUA will remain in effect (meaning this test can be used) for the duration of the COVID-19 declaration under Section 564(b)(1) of the Act, 21 U.S.C. section 360bbb-3(b)(1), unless the authorization is terminated or revoked.  Performed at [facility], [address], [place], [place] [address]   MRSA Next Gen by PCR, Nasal     Status: None   Collection Time: 11/15/22  5:57 PM   Specimen: Nasal Mucosa; Nasal Swab  Result Value  Ref Range Status   MRSA by PCR Next Gen NOT DETECTED NOT DETECTED Final    Comment: (NOTE) The GeneXpert MRSA Assay (FDA approved for NASAL specimens only), is one component of a comprehensive MRSA colonization surveillance program. It is not intended to diagnose MRSA infection nor to guide or monitor treatment for MRSA infections. Test performance is not FDA approved in patients less than 56 years old. Performed at [facility], [address], [place], [place] [address]      Radiology Studies: DG Chest 1 View  Result Date: 11/15/2022 CLINICAL DATA:  Shortness of breath. Acute on chronic respiratory failure. History of COPD and adenocarcinoma of left lung. EXAM: CHEST  1 VIEW COMPARISON:  11/15/2022 FINDINGS: Heart size and pulmonary vascularity are normal. Peribronchial thickening and central interstitial changes consistent with chronic bronchitis. Masslike areas consolidation surrounding surgical clips corresponds to previous neoplasm and treatment changes. No new or developing consolidation or atelectasis. No pleural effusions. No pneumothorax. Calcification of the aorta. IMPRESSION: Chronic bronchitic changes in the lungs. Left upper lobe area of masslike consolidation with surgical clips likely corresponds to known neoplasm and post treatment changes. No change since prior study earlier today. Electronically Signed   By: Marisa Cyphers.D.  On: 11/15/2022 23:29   CT Chest Wo Contrast  Result Date: 11/15/2022 CLINICAL DATA:  Shortness of breath. History of lung cancer. Left apical nodular density on a portable chest radiograph today with an appearance suspicious for recurrent non-small-cell lung cancer. Chest CT was recommended for further evaluation. Status post radiation therapy. EXAM: CT CHEST WITHOUT CONTRAST TECHNIQUE: Multidetector CT imaging of the chest was performed following the standard protocol without IV contrast. RADIATION DOSE REDUCTION: This exam was performed according to  the departmental dose-optimization program which includes automated exposure control, adjustment of the mA and/or kV according to patient size and/or use of iterative reconstruction technique. COMPARISON:  Chest radiographs dated 11/15/2022 and 12/29/2021. Chest CT dated 05/31/2022. FINDINGS: Cardiovascular: Atheromatous calcifications, including the coronary arteries and aorta. Normal sized heart. No pericardial effusion. Mediastinum/Nodes: Marked diffuse enlargement of the thyroid gland with diffuse heterogeneity and calcifications and substernal extension. This is causing mild tracheal narrowing without significant change. No enlarged lymph nodes. The previously demonstrated 9 mm short axis AP window node measures 8 mm in short axis diameter on image number 64/2. Unremarkable esophagus. Lungs/Pleura: Multiple surgical clips in an oval area of consolidation and adjacent prominent interstitial markings in the left upper lobe, containing multiple interval surgical clips. This area measures 5.3 x 2.6 cm on image number 35/4 and corresponds to the mass-like density seen the portable chest obtained earlier today. This extends posteriorly to the major fissure with associated pleural thickening. The previously demonstrated left upper lobe nodule in this area is no longer visualized. No new lung nodules and no pleural fluid. The lungs remain hyperexpanded. Upper Abdomen: Extensive atheromatous arterial calcifications. Cholecystectomy clips. Musculoskeletal: Lower cervical spine degenerative changes and mild thoracic spine degenerative changes. IMPRESSION: 1. 5.3 x 2.6 cm oval area of consolidation and adjacent prominent interstitial markings in the left upper lobe, corresponding to the mass-like density seen on the portable chest obtained earlier today. This is compatible with postsurgical and post radiation changes. 2. The previously demonstrated left upper lobe nodule in this area is no longer visualized, compatible  with interval resection. 3. No enlarged lymph nodes. 4. Stable large thyroid goiter causing mild tracheal narrowing. This has been previously evaluated with imaging. 5. Calcific coronary artery and aortic atherosclerosis. 6. Stable changes of COPD. Aortic Atherosclerosis (ICD10-I70.0) and Emphysema (ICD10-J43.9). Electronically Signed   By: Beckie Salts M.D.   On: 11/15/2022 10:53   DG Chest Port 1 View  Result Date: 11/15/2022 CLINICAL DATA:  Shortness of breath, history of lung cancer. EXAM: PORTABLE CHEST 1 VIEW COMPARISON:  May 29, 2022.  December 29, 2021. FINDINGS: The heart size and mediastinal contours are within normal limits. There is noted large left apical nodular density concerning for malignancy. Surgical clips are seen in this area. Right lung is unremarkable. The visualized skeletal structures are unremarkable. IMPRESSION: Large left apical nodular density is noted concerning for recurrent malignancy. CT scan of the chest is recommended for further evaluation. Electronically Signed   By: Lupita Raider M.D.   On: 11/15/2022 08:18    Scheduled Meds:  aspirin EC  81 mg Oral q AM   Chlorhexidine Gluconate Cloth  6 each Topical Daily   enoxaparin (LOVENOX) injection  30 mg Subcutaneous Q24H   furosemide  60 mg Intravenous BID   ipratropium-albuterol  3 mL Nebulization Q4H   isosorbide mononitrate  30 mg Oral Daily   [START ON 11/17/2022] methylPREDNISolone (SOLU-MEDROL) injection  40 mg Intravenous Daily   metoprolol tartrate  5 mg  Intravenous Q6H   Continuous Infusions:  azithromycin Stopped (11/15/22 1833)   cefTRIAXone (ROCEPHIN)  IV       LOS: 1 day   Critical Care Procedure Note Authorized and Performed by: Maryln Manuel MD  Total Critical Care time:  48 mins Due to a high probability of clinically significant, life threatening deterioration, the patient required my highest level of preparedness to intervene emergently and I personally spent this critical care time directly and  personally managing the patient.  This critical care time included obtaining a history; examining the patient, pulse oximetry; ordering and review of studies; arranging urgent treatment with development of a management plan; evaluation of patient's response of treatment; frequent reassessment; and discussions with other providers.  This critical care time was performed to assess and manage the high probability of imminent and life threatening deterioration that could result in multi-organ failure.  It was exclusive of separately billable procedures and treating other patients and teaching time.    Standley Dakins, MD How to contact the [facility] Attending or Consulting provider 7A - 7P or covering provider during after hours 7P -7A, for this patient?  Check the care team in [facility] and look for a) attending/consulting [facility] provider listed and b) the [facility] team listed Log into [email or web address] and use [facility]'s universal password to access. If you do not have the password, please contact the hospital operator. Locate the [facility] provider you are looking for under [facility] and page to a number that you can be directly reached. If you still have difficulty reaching the provider, please page the [facility] (Director on Call) for the Hospitalists listed on amion for assistance.  11/16/2022, 12:53 PM

## 2022-11-16 NOTE — Progress Notes (Signed)
  Echocardiogram 2D Echocardiogram has been performed.  Lisa [name] 11/16/2022, 12:33 PM

## 2022-11-16 NOTE — Progress Notes (Signed)
Went in to give patient breathing treatment.  Sat at 100% on 2L.  Patient BS still diminished.  Tolerating being off Bipap well at this time but will continue to monitor.

## 2022-11-17 ENCOUNTER — Telehealth: Payer: Self-pay | Admitting: Internal Medicine

## 2022-11-17 DIAGNOSIS — J9601 Acute respiratory failure with hypoxia: Secondary | ICD-10-CM

## 2022-11-17 DIAGNOSIS — N179 Acute kidney failure, unspecified: Secondary | ICD-10-CM | POA: Diagnosis not present

## 2022-11-17 DIAGNOSIS — I5033 Acute on chronic diastolic (congestive) heart failure: Secondary | ICD-10-CM

## 2022-11-17 DIAGNOSIS — I5032 Chronic diastolic (congestive) heart failure: Secondary | ICD-10-CM

## 2022-11-17 DIAGNOSIS — I10 Essential (primary) hypertension: Secondary | ICD-10-CM

## 2022-11-17 DIAGNOSIS — I160 Hypertensive urgency: Secondary | ICD-10-CM | POA: Diagnosis not present

## 2022-11-17 DIAGNOSIS — E43 Unspecified severe protein-calorie malnutrition: Secondary | ICD-10-CM | POA: Diagnosis not present

## 2022-11-17 LAB — BASIC METABOLIC PANEL
Anion gap: 10 (ref 5–15)
BUN: 99 mg/dL — ABNORMAL HIGH (ref 8–23)
CO2: 24 mmol/L (ref 22–32)
Calcium: 8.2 mg/dL — ABNORMAL LOW (ref 8.9–10.3)
Chloride: 103 mmol/L (ref 98–111)
Creatinine, Ser: 2.18 mg/dL — ABNORMAL HIGH (ref 0.44–1.00)
GFR, Estimated: 22 mL/min — ABNORMAL LOW (ref >60)
Glucose, Bld: 127 mg/dL — ABNORMAL HIGH (ref 70–99)
Potassium: 4.2 mmol/L (ref 3.5–5.1)
Sodium: 137 mmol/L (ref 135–145)

## 2022-11-17 LAB — LIPID PANEL
Cholesterol: 127 mg/dL (ref 0–200)
HDL: 43 mg/dL (ref >40)
LDL Cholesterol: 69 mg/dL (ref 0–99)
Total CHOL/HDL Ratio: 3 RATIO
Triglycerides: 77 mg/dL (ref <150)
VLDL: 15 mg/dL (ref 0–40)

## 2022-11-17 LAB — TYPE AND SCREEN: Antibody Screen: NEGATIVE

## 2022-11-17 LAB — CBC WITH DIFFERENTIAL/PLATELET
Abs Immature Granulocytes: 0.02 10*3/uL (ref 0.00–0.07)
Basophils Absolute: 0 10*3/uL (ref 0.0–0.1)
Basophils Relative: 0 %
Eosinophils Absolute: 0 10*3/uL (ref 0.0–0.5)
Eosinophils Relative: 0 %
HCT: 24 % — ABNORMAL LOW (ref 36.0–46.0)
Hemoglobin: 7.9 g/dL — ABNORMAL LOW (ref 12.0–15.0)
Immature Granulocytes: 0 %
Lymphocytes Relative: 7 %
Lymphs Abs: 0.4 10*3/uL — ABNORMAL LOW (ref 0.7–4.0)
MCH: 31.1 pg (ref 26.0–34.0)
MCHC: 32.9 g/dL (ref 30.0–36.0)
MCV: 94.5 fL (ref 80.0–100.0)
Monocytes Absolute: 0.3 10*3/uL (ref 0.1–1.0)
Monocytes Relative: 5 %
Neutro Abs: 5 10*3/uL (ref 1.7–7.7)
Neutrophils Relative %: 88 %
Platelets: 102 10*3/uL — ABNORMAL LOW (ref 150–400)
RBC: 2.54 MIL/uL — ABNORMAL LOW (ref 3.87–5.11)
RDW: 13.2 % (ref 11.5–15.5)
WBC: 5.7 10*3/uL (ref 4.0–10.5)
nRBC: 0 % (ref 0.0–0.2)

## 2022-11-17 LAB — HEPARIN LEVEL (UNFRACTIONATED)
Heparin Unfractionated: 0.25 IU/mL — ABNORMAL LOW (ref 0.30–0.70)
Heparin Unfractionated: 0.37 IU/mL (ref 0.30–0.70)

## 2022-11-17 LAB — PREPARE RBC (CROSSMATCH)

## 2022-11-17 LAB — MAGNESIUM: Magnesium: 2.2 mg/dL (ref 1.7–2.4)

## 2022-11-17 LAB — BPAM RBC: Unit Type and Rh: 5100

## 2022-11-17 MED ORDER — IPRATROPIUM-ALBUTEROL 0.5-2.5 (3) MG/3ML IN SOLN
3.0000 mL | Freq: Three times a day (TID) | RESPIRATORY_TRACT | Status: DC
  Administered 2022-11-17 – 2022-11-21 (×12): 3 mL via RESPIRATORY_TRACT
  Filled 2022-11-17 (×12): qty 3

## 2022-11-17 MED ORDER — CARVEDILOL 12.5 MG PO TABS
12.5000 mg | ORAL_TABLET | Freq: Two times a day (BID) | ORAL | Status: DC
  Administered 2022-11-17 – 2022-11-19 (×5): 12.5 mg via ORAL
  Filled 2022-11-17 (×6): qty 1

## 2022-11-17 MED ORDER — METHYLPREDNISOLONE SODIUM SUCC 40 MG IJ SOLR
20.0000 mg | Freq: Every day | INTRAMUSCULAR | Status: DC
  Administered 2022-11-18: 20 mg via INTRAVENOUS
  Filled 2022-11-17: qty 1

## 2022-11-17 MED ORDER — ISOSORBIDE MONONITRATE ER 60 MG PO TB24
60.0000 mg | ORAL_TABLET | Freq: Every day | ORAL | Status: DC
  Administered 2022-11-18 – 2022-11-21 (×4): 60 mg via ORAL
  Filled 2022-11-17 (×4): qty 1

## 2022-11-17 MED ORDER — CARVEDILOL 12.5 MG PO TABS: 12.5000 mg | ORAL_TABLET | Freq: Two times a day (BID) | ORAL | Status: DC

## 2022-11-17 MED ORDER — SODIUM CHLORIDE 0.9% IV SOLUTION: Freq: Once | INTRAVENOUS | Status: DC

## 2022-11-17 MED ORDER — ISOSORBIDE MONONITRATE ER 30 MG PO TB24
30.0000 mg | ORAL_TABLET | Freq: Once | ORAL | Status: AC
  Administered 2022-11-17: 30 mg via ORAL
  Filled 2022-11-17: qty 1

## 2022-11-17 MED ORDER — ROSUVASTATIN CALCIUM 20 MG PO TABS
20.0000 mg | ORAL_TABLET | Freq: Every day | ORAL | Status: DC
  Administered 2022-11-17 – 2022-11-20 (×4): 20 mg via ORAL
  Filled 2022-11-17 (×4): qty 1

## 2022-11-17 NOTE — Progress Notes (Signed)
PROGRESS NOTE   Lisa [name]  ZOX:[id number] DOB: Oct 22, 1934 DOA: 11/15/2022 PCP: [name], MD   Chief Complaint  Patient presents with   Shortness of Breath   Level of care: Stepdown  Brief Admission History:  88 year old female with stage IIIb CKD, chronic hepatitis C, former heavy tobacco user, hypertension, COPD, malignant neoplasm of left lung, treated with radiation, smoldering myeloma, goiter, monoclonal gammopathy of unknown significance, chronic dyspnea, coronary artery disease, anemia and CKD, diastolic heart failure with failure to thrive presented to the emergency department by EMS with acute onset of severe shortness of breath.  She has had progressive worsening shortness of breath since yesterday.  She started showing more signs of respiratory stridor and grunting and EMS noted pulse ox 85% on room air.  She complained of chest tightness and chest pain.  She is not normally on supplemental oxygen but was placed on nasal cannula en route.  Patient was severely hypertensive on arrival with systolic blood pressure of 238.  She was given albuterol bronchodilator treatment and IV steroids.  Her chest x-ray and CT chest showed chronic findings.  Unfortunately her respiratory distress progressed and she required continuous BiPAP therapy.  She was not able to have IV contrast for CTA chest and VQ scan not completed.  She is being admitted for acute respiratory distress presumably secondary to his COPD exacerbation.  After discussion with her healthcare surrogate decision made to make patient DNR but continue BiPAP treatments and stepdown ICU care.  Influenza and COVID testing negative.   Assessment and Plan:  Acute on chronic respiratory failure with hypoxia -multifactorial causes including acute heart failure and acute MI  -Patient presents with acute severe respiratory distress requiring continuous BiPAP therapy -Goals of care discussion with healthcare surrogate patient made  DNR -Continue IV steroids and scheduled bronchodilators, weaning steroids  -Continue supplemental oxygen -clinical improvement noted today   Acute Encephalopathy - resolved  -secondary to hypercarbia related to acute respiratory failure -mentation is improving on bipap -ABG reassuring   Anemia of chronic disease -Hg down to 7.8, goal is >8 -transfuse 1 unit PRBC 6/7  NSTEMI -appreciate cardiology consultation and recommendations -treating medically per ACS protocol per Dr. Jenene Slicker -goals of care discussion with palliative medicine team ongoing  -pt decided today no further invasive testing desired and she does not want cath or interventions -goal is to medically treat    COPD with acute exacerbation  -continue IV methylprednisolone but weaning dose as respiratory distress improves -continue IV antibiotics -continue scheduled bronchodilators    Severe protein calorie malnutrition -pt is severely emaciated -will request dietitian consultation when more medically stable   Adult Failure to thrive  -IPAL meeting for goals of care discussion with brother - healthcare decision maker -after discussions, decision made to make DNR  -if not improving with aggressive treatments would get a palliative medicine consultation   Stage IV CKD  -monitor Cr daily with diuresis -renally dose medication    Elevated BNP -likely acute exacerbation of diastolic heart failure in setting of severe respiratory distress -IV lasix by cardiology team  Intake/Output Summary (Last 24 hours) at 11/17/2022 1546 Last data filed at 11/17/2022 1500 Gross per 24 hour  Intake 1313.84 ml  Output 1250 ml  Net 63.84 ml   Filed Weights   11/15/22 1712  Weight: 47.1 kg     Hypertensive Urgency  -severely elevated BPs likely related to acute respiratory failure -Pt presented with severe respiratory distress  -IV hydralazine ordered,  IV metoprolol ordered -cardiology made some med changes today -added  carvedilol 12.5 mg BID and increased dose of imdur to 60 mg    Adenocarcinoma / Mass of Left upper lung -s/p radiation treatments by Dr. Roselind Messier 5/1-10/17/2021 -CT and CXR show stable findings likely residual scarring -Follow up with Dr. Ellin Saba for ongoing surveillance as scheduled   MGUS / smoldering multiple myeloma - she is being followed for this by Dr. Ellin Saba    DVT prophylaxis: IV heparin infusion x 72 hours Code Status: DNR  Family Communication: brother  Disposition: Status is: Inpatient Remains inpatient appropriate because: intensity    Consultants:  Cardiology Palliative medicine  Procedures:   Antimicrobials:    Subjective: Pt had brief episode of chest pain overnight and had some SOB this morning.  She remains off bipap  Objective: Vitals:   11/17/22 1300 11/17/22 1400 11/17/22 1444 11/17/22 1500  BP: (!) 185/67 (!) 132/43  (!) 144/48  Pulse:      Resp: 18 18  20   Temp: 98.2 F (36.8 C)     TempSrc: Oral     SpO2:   100%   Weight:        Intake/Output Summary (Last 24 hours) at 11/17/2022 1546 Last data filed at 11/17/2022 1500 Gross per 24 hour  Intake 1313.84 ml  Output 1250 ml  Net 63.84 ml   Filed Weights   11/15/22 1712  Weight: 47.1 kg   Examination:  General exam: Appears chronically ill on [place], good  mentation, alert, cooperative, calm and comfortable  Respiratory system: better air movement, shallow breathing but mostly clear to auscultation.  Cardiovascular system: normal S1 & S2 heard. No JVD, murmurs, rubs, gallops or clicks. No pedal edema. Gastrointestinal system: Abdomen is nondistended, soft and nontender. No organomegaly or masses felt. Normal bowel sounds heard. Central nervous system: Alert and oriented. No focal neurological deficits. Extremities: Symmetric 5 x 5 power. Skin: No rashes, lesions or ulcers. Psychiatry: Judgement and insight appear normal. Mood & affect appropriate.   Data Reviewed: I have personally reviewed  following labs and imaging studies  CBC: Recent Labs  Lab 11/15/22 0805 11/16/22 0421 11/17/22 0145  WBC 4.9 3.4* 5.7  NEUTROABS 3.3 2.9 5.0  HGB 9.2* 9.0* 7.9*  HCT 28.6* 27.9* 24.0*  MCV 97.3 97.2 94.5  PLT 91* 102* 102*    Basic Metabolic Panel: Recent Labs  Lab 11/15/22 0805 11/16/22 0421 11/17/22 0145  NA 135 138 137  K 3.9 4.1 4.2  CL 103 103 103  CO2 25 23 24   GLUCOSE 152* 122* 127*  BUN 65* 73* 99*  CREATININE 1.80* 1.86* 2.18*  CALCIUM 8.7* 8.7* 8.2*  MG  --  2.0 2.2    CBG: No results for input(s): "GLUCAP" in the last 168 hours.  Recent Results (from the past 240 hour(s))  Resp panel by RT-PCR (RSV, Flu A&B, Covid) Anterior Nasal Swab     Status: None   Collection Time: 11/15/22  8:12 AM   Specimen: Anterior Nasal Swab  Result Value Ref Range Status   SARS Coronavirus 2 by RT PCR NEGATIVE NEGATIVE Final    Comment: (NOTE) SARS-CoV-2 target nucleic acids are NOT DETECTED.  The SARS-CoV-2 RNA is generally detectable in upper respiratory specimens during the acute phase of infection. The lowest concentration of SARS-CoV-2 viral copies this assay can detect is 138 copies/mL. A negative result does not preclude SARS-Cov-2 infection and should not be used as the sole basis for treatment or other patient  management decisions. A negative result may occur with  improper specimen collection/handling, submission of specimen other than nasopharyngeal swab, presence of viral mutation(s) within the areas targeted by this assay, and inadequate number of viral copies(<138 copies/mL). A negative result must be combined with clinical observations, patient history, and epidemiological information. The expected result is Negative.  Fact Sheet for Patients:  BloggerCourse.com  Fact Sheet for Healthcare Providers:  SeriousBroker.it  This test is no t yet approved or cleared by the [place] FDA and  has been  authorized for detection and/or diagnosis of SARS-CoV-2 by FDA under an Emergency Use Authorization (EUA). This EUA will remain  in effect (meaning this test can be used) for the duration of the COVID-19 declaration under Section 564(b)(1) of the Act, 21 U.S.C.section 360bbb-3(b)(1), unless the authorization is terminated  or revoked sooner.       Influenza A by PCR NEGATIVE NEGATIVE Final   Influenza B by PCR NEGATIVE NEGATIVE Final    Comment: (NOTE) The Xpert Xpress SARS-CoV-2/FLU/RSV plus assay is intended as an aid in the diagnosis of influenza from Nasopharyngeal swab specimens and should not be used as a sole basis for treatment. Nasal washings and aspirates are unacceptable for Xpert Xpress SARS-CoV-2/FLU/RSV testing.  Fact Sheet for Patients: BloggerCourse.com  Fact Sheet for Healthcare Providers: SeriousBroker.it  This test is not yet approved or cleared by the [place] FDA and has been authorized for detection and/or diagnosis of SARS-CoV-2 by FDA under an Emergency Use Authorization (EUA). This EUA will remain in effect (meaning this test can be used) for the duration of the COVID-19 declaration under Section 564(b)(1) of the Act, 21 U.S.C. section 360bbb-3(b)(1), unless the authorization is terminated or revoked.     Resp Syncytial Virus by PCR NEGATIVE NEGATIVE Final    Comment: (NOTE) Fact Sheet for Patients: BloggerCourse.com  Fact Sheet for Healthcare Providers: SeriousBroker.it  This test is not yet approved or cleared by the [place] FDA and has been authorized for detection and/or diagnosis of SARS-CoV-2 by FDA under an Emergency Use Authorization (EUA). This EUA will remain in effect (meaning this test can be used) for the duration of the COVID-19 declaration under Section 564(b)(1) of the Act, 21 U.S.C. section 360bbb-3(b)(1), unless the  authorization is terminated or revoked.  Performed at [facility], [address], [place], [place] [address]   MRSA Next Gen by PCR, Nasal     Status: None   Collection Time: 11/15/22  5:57 PM   Specimen: Nasal Mucosa; Nasal Swab  Result Value Ref Range Status   MRSA by PCR Next Gen NOT DETECTED NOT DETECTED Final    Comment: (NOTE) The GeneXpert MRSA Assay (FDA approved for NASAL specimens only), is one component of a comprehensive MRSA colonization surveillance program. It is not intended to diagnose MRSA infection nor to guide or monitor treatment for MRSA infections. Test performance is not FDA approved in patients less than 47 years old. Performed at [facility], [address], [place], [place] [address]      Radiology Studies: ECHOCARDIOGRAM COMPLETE  Result Date: 11/16/2022    ECHOCARDIOGRAM REPORT   Patient Name:   MARIANE LAPPEN DAVIS Date of Exam: 11/16/2022 Medical Rec #:  [id number]          Height:       67.0 in Accession #:    [id number]         Weight:       103.8 lb Date of Birth:  08-Apr-1935  BSA:          1.531 m Patient Age:    87 years           BP:           161/69 mmHg Patient Gender: F                  HR:           88 bpm. [facility] Procedure: 2D Echo, Color Doppler and Cardiac Doppler Indications:    acute diastolic chf  History:        Patient has prior history of Echocardiogram examinations, most                 recent 12/28/2021. COPD and chronic kidney disease,                 Signs/Symptoms:elevated troponin; Risk Factors:Hypertension.  Sonographer:    Delcie Roch RDCS Referring Phys: [id number] Lennart Pall STRADER IMPRESSIONS  1. Left ventricular ejection fraction, by estimation, is 55 to 60%. The left ventricle has normal function. The left ventricle demonstrates regional wall motion abnormalities (see scoring diagram/findings for description). Left ventricular diastolic parameters are consistent with Grade II diastolic dysfunction  (pseudonormalization).  2. Right ventricular systolic function is normal. The right ventricular size is normal. There is severely elevated pulmonary artery systolic pressure.  3. Left atrial size was mildly dilated.  4. The mitral valve is abnormal. Mild mitral valve regurgitation. No evidence of mitral stenosis. Severe mitral annular calcification.  5. The aortic valve has an indeterminant number of cusps. Aortic valve regurgitation is not visualized. No aortic stenosis is present.  6. The inferior vena cava is dilated in size with >50% respiratory variability, suggesting right atrial pressure of 8 mmHg. Comparison(s): Prior images reviewed side by side. Changes from prior study are noted. RWMA with akinesis in the basal inferior wall and basal inferoseptal wall are new. FINDINGS  Left Ventricle: Left ventricular ejection fraction, by estimation, is 55 to 60%. The left ventricle has normal function. The left ventricle demonstrates regional wall motion abnormalities. The left ventricular internal cavity size was normal in size. There is no left ventricular hypertrophy. Left ventricular diastolic parameters are consistent with Grade II diastolic dysfunction (pseudonormalization).  LV Wall Scoring: The basal inferior segment and basal inferoseptal segment are akinetic. Right Ventricle: The right ventricular size is normal. No increase in right ventricular wall thickness. Right ventricular systolic function is normal. There is severely elevated pulmonary artery systolic pressure. The tricuspid regurgitant velocity is 4.25 m/s, and with an assumed right atrial pressure of 8 mmHg, the estimated right ventricular systolic pressure is 80.2 mmHg. Left Atrium: Left atrial size was mildly dilated. Right Atrium: Right atrial size was normal in size. Pericardium: There is no evidence of pericardial effusion. Mitral Valve: The mitral valve is abnormal. Severe mitral annular calcification. Mild mitral valve regurgitation. No  evidence of mitral valve stenosis. Tricuspid Valve: The tricuspid valve is grossly normal. Tricuspid valve regurgitation is mild . No evidence of tricuspid stenosis. Aortic Valve: The aortic valve has an indeterminant number of cusps. Aortic valve regurgitation is not visualized. No aortic stenosis is present. Pulmonic Valve: The pulmonic valve was not well visualized. Pulmonic valve regurgitation is trivial. No evidence of pulmonic stenosis. Aorta: The aortic root is normal in size and structure. Venous: The inferior vena cava is dilated in size with greater than 50% respiratory variability, suggesting right atrial pressure of 8 mmHg. IAS/Shunts: No  atrial level shunt detected by color flow Doppler.  LEFT VENTRICLE PLAX 2D LVIDd:         4.00 cm   Diastology LVIDs:         3.00 cm   LV e' medial:    5.44 cm/s LV PW:         1.20 cm   LV E/e' medial:  25.9 LV IVS:        1.10 cm   LV e' lateral:   6.96 cm/s LVOT diam:     1.50 cm   LV E/e' lateral: 20.3 LV SV:         24 LV SV Index:   16 LVOT Area:     1.77 cm  RIGHT VENTRICLE             IVC RV Basal diam:  2.70 cm     IVC diam: 2.50 cm RV S prime:     12.90 cm/s TAPSE (M-mode): 2.4 cm LEFT ATRIUM             Index        RIGHT ATRIUM           Index LA diam:        4.00 cm 2.61 cm/m   RA Area:     11.40 cm LA Vol (A2C):   50.9 ml 33.26 ml/m  RA Volume:   23.90 ml  15.62 ml/m LA Vol (A4C):   56.9 ml 37.18 ml/m LA Biplane Vol: 56.3 ml 36.78 ml/m  AORTIC VALVE LVOT Vmax:   78.00 cm/s LVOT Vmean:  49.700 cm/s LVOT VTI:    0.137 m  AORTA Ao Root diam: 2.60 cm MITRAL VALVE                TRICUSPID VALVE MV Area (PHT): 4.60 cm     TR Peak grad:   72.2 mmHg MV Decel Time: 165 msec     TR Vmax:        425.00 cm/s MV E velocity: 141.00 cm/s MV A velocity: 72.80 cm/s   SHUNTS MV E/A ratio:  1.94         Systemic VTI:  0.14 m                             Systemic Diam: 1.50 cm Vishnu Priya Mallipeddi Electronically signed by Winfield Rast Mallipeddi Signature  Date/Time: 11/16/2022/1:41:45 PM    Final    DG Chest 1 View  Result Date: 11/15/2022 CLINICAL DATA:  Shortness of breath. Acute on chronic respiratory failure. History of COPD and adenocarcinoma of left lung. EXAM: CHEST  1 VIEW COMPARISON:  11/15/2022 FINDINGS: Heart size and pulmonary vascularity are normal. Peribronchial thickening and central interstitial changes consistent with chronic bronchitis. Masslike areas consolidation surrounding surgical clips corresponds to previous neoplasm and treatment changes. No new or developing consolidation or atelectasis. No pleural effusions. No pneumothorax. Calcification of the aorta. IMPRESSION: Chronic bronchitic changes in the lungs. Left upper lobe area of masslike consolidation with surgical clips likely corresponds to known neoplasm and post treatment changes. No change since prior study earlier today. Electronically Signed   By: Burman Nieves M.D.   On: 11/15/2022 23:29    Scheduled Meds:  sodium chloride   Intravenous Once   aspirin EC  81 mg Oral q AM   carvedilol  12.5 mg Oral BID WC   Chlorhexidine Gluconate Cloth  6 each Topical Daily  ipratropium-albuterol  3 mL Nebulization TID   [START ON 11/18/2022] isosorbide mononitrate  60 mg Oral Daily   [START ON 11/18/2022] methylPREDNISolone (SOLU-MEDROL) injection  20 mg Intravenous Daily   rosuvastatin  20 mg Oral q1800   Continuous Infusions:  azithromycin Stopped (11/16/22 1701)   cefTRIAXone (ROCEPHIN)  IV 1 g (11/17/22 1528)   heparin 650 Units/hr (11/17/22 0311)     LOS: 2 days   Critical Care Procedure Note Authorized and Performed by: Maryln Manuel MD  Total Critical Care time:  41 mins Due to a high probability of clinically significant, life threatening deterioration, the patient required my highest level of preparedness to intervene emergently and I personally spent this critical care time directly and personally managing the patient.  This critical care time included obtaining a  history; examining the patient, pulse oximetry; ordering and review of studies; arranging urgent treatment with development of a management plan; evaluation of patient's response of treatment; frequent reassessment; and discussions with other providers.  This critical care time was performed to assess and manage the high probability of imminent and life threatening deterioration that could result in multi-organ failure.  It was exclusive of separately billable procedures and treating other patients and teaching time.    Standley Dakins, MD How to contact the [facility] Attending or Consulting provider 7A - 7P or covering provider during after hours 7P -7A, for this patient?  Check the care team in [facility] and look for a) attending/consulting [facility] provider listed and b) the [facility] team listed Log into [email or web address] and use [facility] universal password to access. If you do not have the password, please contact the hospital operator. Locate the [facility] provider you are looking for under [facility] and page to a number that you can be directly reached. If you still have difficulty reaching the provider, please page the [facility] (Director on Call) for the Hospitalists listed on amion for assistance.  11/17/2022, 3:46 PM

## 2022-11-17 NOTE — Telephone Encounter (Addendum)
Pts niece called in regards to pt. Stated pt is in [facility] due to a recent heart attack 6/05. She wanted to inform [place].

## 2022-11-17 NOTE — Progress Notes (Signed)
Bipap is PRN order; patient is currently on 3L with sat of 100%.  BS Rhonchi but no distress noted.

## 2022-11-17 NOTE — Progress Notes (Signed)
ANTICOAGULATION CONSULT NOTE   Pharmacy Consult for heparin Indication: chest pain/ACS  No Known Allergies  Patient Measurements: Weight: 47.1 kg (103 lb 13.4 oz) Heparin Dosing Weight: 47 kg  Vital Signs: Temp: 98.3 F (36.8 C) (06/07 0430) Temp Source: Axillary (06/07 0430) BP: 162/53 (06/07 0600) Pulse Rate: 59 (06/07 0600)  Labs: Recent Labs    11/15/22 0805 11/15/22 2213 11/16/22 0003 11/16/22 0421 11/16/22 0939 11/17/22 0145  HGB 9.2*  --   --  9.0*  --  7.9*  HCT 28.6*  --   --  27.9*  --  24.0*  PLT 91*  --   --  102*  --  102*  HEPARINUNFRC  --   --   --   --   --  0.25*  CREATININE 1.80*  --   --  1.86*  --  2.18*  TROPONINIHS  --  692* 504*  --  12,379*  --      Estimated Creatinine Clearance: 14 mL/min (A) (by C-G formula based on SCr of 2.18 mg/dL (H)).   Medical History: Past Medical History:  Diagnosis Date   Anemia    Carotid artery stenosis    CKD (chronic kidney disease)    COPD (chronic obstructive pulmonary disease) (HCC)    Dyspnea    Due to COPD per patient   Goiter diffuse, nontoxic    Per patient   Hepatitis C    History of radiation therapy    Left lung-10/10/21-10/17/21- Dr. Antony Blackbird   Hypertension    Monoclonal gammopathy of unknown significance (MGUS)    Pyelonephritis 04/06/2014   UTI   Assessment: Pharmacy consulted to dose heparin in patient with NSTEMI.  Patient is not on anticoagulation prior to admit. Heparin level now at goal on 650 units/hr. No bleeding issues noted however patient did receive a unit of blood today for hgb of 7.9.   Goal of Therapy:  Heparin level 0.3-0.7 units/ml Monitor platelets by anticoagulation protocol: Yes   Plan:  Continue heparin at 650 units/hr Planning medical management - 72 hours of heparin  Sheppard Coil PharmD., BCPS Clinical Pharmacist 11/17/2022 8:00 AM

## 2022-11-17 NOTE — Progress Notes (Signed)
ANTICOAGULATION CONSULT NOTE - Follow Up Consult  Pharmacy Consult for heparin Indication:  NSTEMI  Labs: Recent Labs    11/15/22 0805 11/15/22 2213 11/16/22 0003 11/16/22 0421 11/16/22 0939 11/17/22 0145  HGB 9.2*  --   --  9.0*  --  7.9*  HCT 28.6*  --   --  27.9*  --  24.0*  PLT 91*  --   --  102*  --  102*  HEPARINUNFRC  --   --   --   --   --  0.25*  CREATININE 1.80*  --   --  1.86*  --  2.18*  TROPONINIHS  --  692* 504*  --  12,379*  --     Assessment: 87yo female subtherapeutic on heparin with initial dosing for NSTEMI; no infusion issues or signs of bleeding per RN.  Goal of Therapy:  Heparin level 0.3-0.7 units/ml   Plan:  Increase heparin infusion by 2 units/kg/hr to 650 units/hr. Check level in 8 hours.   Vernard Gambles, PharmD, BCPS 11/17/2022 3:09 AM

## 2022-11-17 NOTE — Progress Notes (Signed)
Rounding Note    Patient Name: Lisa [name] Date of Encounter: 11/17/2022  [facility] Cardiologist: Little Ishikawa, MD  Subjective   Breathing improved. No orthopnea or PND. No longer on BiPAP. Reports having one episode of chest pain overnight which occurred while rolling from side to side in bed. Pain spontaneously resolved within a few minutes. No recurrence this morning.   Inpatient Medications    Scheduled Meds:  sodium chloride   Intravenous Once   aspirin EC  81 mg Oral q AM   Chlorhexidine Gluconate Cloth  6 each Topical Daily   ipratropium-albuterol  3 mL Nebulization TID   isosorbide mononitrate  30 mg Oral Daily   methylPREDNISolone (SOLU-MEDROL) injection  40 mg Intravenous Daily   metoprolol tartrate  5 mg Intravenous Q6H   Continuous Infusions:  azithromycin Stopped (11/16/22 1701)   cefTRIAXone (ROCEPHIN)  IV Stopped (11/16/22 1555)   heparin 650 Units/hr (11/17/22 0311)   PRN Meds: hydrALAZINE, ondansetron **OR** ondansetron (ZOFRAN) IV   Vital Signs    Vitals:   11/17/22 0530 11/17/22 0600 11/17/22 0730 11/17/22 0803  BP: (!) 164/60 (!) 162/53    Pulse: 65 (!) 59    Resp: 16 17    Temp:    98.1 F (36.7 C)  TempSrc:    Oral  SpO2: 99% 98% 100%   Weight:        Intake/Output Summary (Last 24 hours) at 11/17/2022 0854 Last data filed at 11/17/2022 0600 Gross per 24 hour  Intake 390.03 ml  Output 1250 ml  Net -859.97 ml      11/15/2022    5:12 PM 10/16/2022   10:48 AM 09/04/2022   10:47 AM  Last 3 Weights  Weight (lbs) 103 lb 13.4 oz 104 lb 3.2 oz 105 lb  Weight (kg) 47.1 kg 47.265 kg 47.628 kg      Telemetry    NSR, HR in 70's to 80's. 8 beats NSVT.  - Personally Reviewed  ECG    No new tracings.   Physical Exam   GEN: Pleasant, elderly female appearing in no acute distress.   Neck: No JVD Cardiac: RRR, no murmurs, rubs, or gallops.  Respiratory: Clear to auscultation bilaterally without wheezing or  rales. GI: Soft, nontender, non-distended  MS: No pitting edema; No deformity. Neuro:  Nonfocal  Psych: Normal affect   Labs    High Sensitivity Troponin:   Recent Labs  Lab 11/15/22 2213 11/16/22 0003 11/16/22 0939  TROPONINIHS 692* 504* 12,379*     Chemistry Recent Labs  Lab 11/15/22 0805 11/16/22 0421 11/17/22 0145  NA 135 138 137  K 3.9 4.1 4.2  CL 103 103 103  CO2 25 23 24   GLUCOSE 152* 122* 127*  BUN 65* 73* 99*  CREATININE 1.80* 1.86* 2.18*  CALCIUM 8.7* 8.7* 8.2*  MG  --  2.0 2.2  PROT 9.7*  --   --   ALBUMIN 3.1*  --   --   AST 35  --   --   ALT 23  --   --   ALKPHOS 59  --   --   BILITOT 0.4  --   --   GFRNONAA 27* 26* 22*  ANIONGAP 7 12 10     Lipids  Recent Labs  Lab 11/17/22 0145  CHOL 127  TRIG 77  HDL 43  LDLCALC 69  CHOLHDL 3.0    Hematology Recent Labs  Lab 11/15/22 0805 11/16/22 0421 11/17/22 0145  WBC 4.9 3.4* 5.7  RBC 2.94* 2.87* 2.54*  HGB 9.2* 9.0* 7.9*  HCT 28.6* 27.9* 24.0*  MCV 97.3 97.2 94.5  MCH 31.3 31.4 31.1  MCHC 32.2 32.3 32.9  RDW 13.0 12.9 13.2  PLT 91* 102* 102*   Thyroid No results for input(s): "TSH", "FREET4" in the last 168 hours.  BNP Recent Labs  Lab 11/15/22 0805 11/16/22 0421  BNP 612.0* 1,659.0*    DDimer No results for input(s): "DDIMER" in the last 168 hours.   Radiology   DG Chest 1 View  Result Date: 11/15/2022 CLINICAL DATA:  Shortness of breath. Acute on chronic respiratory failure. History of COPD and adenocarcinoma of left lung. EXAM: CHEST  1 VIEW COMPARISON:  11/15/2022 FINDINGS: Heart size and pulmonary vascularity are normal. Peribronchial thickening and central interstitial changes consistent with chronic bronchitis. Masslike areas consolidation surrounding surgical clips corresponds to previous neoplasm and treatment changes. No new or developing consolidation or atelectasis. No pleural effusions. No pneumothorax. Calcification of the aorta. IMPRESSION: Chronic bronchitic changes in  the lungs. Left upper lobe area of masslike consolidation with surgical clips likely corresponds to known neoplasm and post treatment changes. No change since prior study earlier today. Electronically Signed   By: Burman Nieves M.D.   On: 11/15/2022 23:29   CT Chest Wo Contrast  Result Date: 11/15/2022 CLINICAL DATA:  Shortness of breath. History of lung cancer. Left apical nodular density on a portable chest radiograph today with an appearance suspicious for recurrent non-small-cell lung cancer. Chest CT was recommended for further evaluation. Status post radiation therapy. EXAM: CT CHEST WITHOUT CONTRAST TECHNIQUE: Multidetector CT imaging of the chest was performed following the standard protocol without IV contrast. RADIATION DOSE REDUCTION: This exam was performed according to the departmental dose-optimization program which includes automated exposure control, adjustment of the mA and/or kV according to patient size and/or use of iterative reconstruction technique. COMPARISON:  Chest radiographs dated 11/15/2022 and 12/29/2021. Chest CT dated 05/31/2022. FINDINGS: Cardiovascular: Atheromatous calcifications, including the coronary arteries and aorta. Normal sized heart. No pericardial effusion. Mediastinum/Nodes: Marked diffuse enlargement of the thyroid gland with diffuse heterogeneity and calcifications and substernal extension. This is causing mild tracheal narrowing without significant change. No enlarged lymph nodes. The previously demonstrated 9 mm short axis AP window node measures 8 mm in short axis diameter on image number 64/2. Unremarkable esophagus. Lungs/Pleura: Multiple surgical clips in an oval area of consolidation and adjacent prominent interstitial markings in the left upper lobe, containing multiple interval surgical clips. This area measures 5.3 x 2.6 cm on image number 35/4 and corresponds to the mass-like density seen the portable chest obtained earlier today. This extends  posteriorly to the major fissure with associated pleural thickening. The previously demonstrated left upper lobe nodule in this area is no longer visualized. No new lung nodules and no pleural fluid. The lungs remain hyperexpanded. Upper Abdomen: Extensive atheromatous arterial calcifications. Cholecystectomy clips. Musculoskeletal: Lower cervical spine degenerative changes and mild thoracic spine degenerative changes. IMPRESSION: 1. 5.3 x 2.6 cm oval area of consolidation and adjacent prominent interstitial markings in the left upper lobe, corresponding to the mass-like density seen on the portable chest obtained earlier today. This is compatible with postsurgical and post radiation changes. 2. The previously demonstrated left upper lobe nodule in this area is no longer visualized, compatible with interval resection. 3. No enlarged lymph nodes. 4. Stable large thyroid goiter causing mild tracheal narrowing. This has been previously evaluated with imaging. 5. Calcific coronary artery and  aortic atherosclerosis. 6. Stable changes of COPD. Aortic Atherosclerosis (ICD10-I70.0) and Emphysema (ICD10-J43.9). Electronically Signed   By: Beckie Salts M.D.   On: 11/15/2022 10:53    Cardiac Studies   Echocardiogram: 11/16/2022 IMPRESSIONS     1. Left ventricular ejection fraction, by estimation, is 55 to 60%. The  left ventricle has normal function. The left ventricle demonstrates  regional wall motion abnormalities (see scoring diagram/findings for  description). Left ventricular diastolic  parameters are consistent with Grade II diastolic dysfunction  (pseudonormalization).   2. Right ventricular systolic function is normal. The right ventricular  size is normal. There is severely elevated pulmonary artery systolic  pressure.   3. Left atrial size was mildly dilated.   4. The mitral valve is abnormal. Mild mitral valve regurgitation. No  evidence of mitral stenosis. Severe mitral annular calcification.    5. The aortic valve has an indeterminant number of cusps. Aortic valve  regurgitation is not visualized. No aortic stenosis is present.   6. The inferior vena cava is dilated in size with >50% respiratory  variability, suggesting right atrial pressure of 8 mmHg.   Comparison(s): Prior images reviewed side by side. Changes from prior  study are noted. RWMA with akinesis in the basal inferior wall and basal  inferoseptal wall are new.   Patient Profile     88 y.o. female w/PMH of elevated troponin values (occurring during admission in 08/2021 following bronchoscopy and medical management pursued given Stage 4 CKD), SVT, HTN, Stage 4 CKD, COPD, adenocarcinoma of left lung (s/p radiation) and MGUS who is currently admitted for acute hypoxic respiratory failure and NSTEMI.   Assessment & Plan    1. Acute HFpEF - Presented with worsening fatigue and dyspnea, found to have a CHF exacerbation with BNP elevated to 1659. Echo this admission shows a preserved EF of 55-60% but noted to have akinesis of the basal inferior and inferoseptal wall.  - She has a recorded output of -1.2 L. Weights not recorded. Given the significant improvement in her respiratory status, would hold additional IV Lasix for now as creatinine did trend up from 1.86 to 2.18. Not an ideal candidate for SGLT2 inhibitor given her BMI at 16.   2. NSTEMI - Reported episodes of chest pain which would occur when she could not catch her breath and Hs Troponin values are significantly elevated at 692, 504 and 12,379. In discussion with the patient yesterday and today, she does not wish to undergo invasive procedures such as a cardiac catheterization given her age. She is A&Ox3 and able to make her wishes known at this time. This is reasonable as she would also be at high-risk for contrast induced nephropathy given her Stage 4 CKD.  - Dr. Jenene Slicker has recommended medical management with IV Heparin for 72 hours. If Hgb continues to decline, we  may need to stop at 48 hours. Continue ASA 81mg  daily and will restart her PTA Coreg in place of IV Lopressor. Will titrate Imdur to 60mg  daily and start Crestor 20mg  daily (LDL 69 this admission).    3. Acute on Chronic Hypoxic Respiratory Failure/COPD Exacerbation - Felt to be multifactorial in the setting of CHF exacerbation and COPD exacerbation. Improving and now off BiPAP.    4. Hypertensive Urgency - SBP has improved but still in the 180's. Will titrate Imdur to 60mg  daily as discussed above and restart Coreg as she can now take PO medications. Coreg can be further titrated pending BP trend. May require Amlodipine as  well.    5. Stage 4 CKD - Followed by Dr. Wolfgang Phoenix as an outpatient. Baseline creatinine ~ 1.4. At 1.80 on admission and trending up to 2.18 today. Will hold additional IV Lasix. She was on Telmisartan prior to admission and this has been held as well.    6. SVT/Brief NSVT - She did have 8 beats NSVT overnight. No recurrent SVT. K+ 4.2 this AM and Mg 2.2. Given that she is now able to take PO medications, will stop IV Lopressor and switch to Coreg. Was on 25mg  BID at home. Will start at 12.5mg  BID and can titrate tomorrow pending BP response.   7. Anemia - Baseline Hgb 8.0 - 9.0. At 7.9 this AM. No reports of active bleeding. If still dropping tomorrow, will need to stop IV Heparin after 48 hours. With her episodes of chest pain, would aim to keep Hgb ~ 8.0.    For questions or updates, please contact [facility] Please consult [email or web address] for contact info under        Signed, Ellsworth Lennox, PA-C  11/17/2022, 8:54 AM

## 2022-11-17 NOTE — Telephone Encounter (Deleted)
yuytuty

## 2022-11-18 ENCOUNTER — Inpatient Hospital Stay (HOSPITAL_COMMUNITY): Payer: Medicare Other

## 2022-11-18 DIAGNOSIS — J9601 Acute respiratory failure with hypoxia: Secondary | ICD-10-CM | POA: Diagnosis not present

## 2022-11-18 DIAGNOSIS — J441 Chronic obstructive pulmonary disease with (acute) exacerbation: Secondary | ICD-10-CM | POA: Diagnosis not present

## 2022-11-18 DIAGNOSIS — I509 Heart failure, unspecified: Secondary | ICD-10-CM

## 2022-11-18 LAB — CBC WITH DIFFERENTIAL/PLATELET
Abs Immature Granulocytes: 0.02 10*3/uL (ref 0.00–0.07)
Basophils Absolute: 0 10*3/uL (ref 0.0–0.1)
Basophils Relative: 0 %
Eosinophils Absolute: 0 10*3/uL (ref 0.0–0.5)
Eosinophils Relative: 0 %
HCT: 31 % — ABNORMAL LOW (ref 36.0–46.0)
Hemoglobin: 10.3 g/dL — ABNORMAL LOW (ref 12.0–15.0)
Immature Granulocytes: 0 %
Lymphocytes Relative: 6 %
Lymphs Abs: 0.4 10*3/uL — ABNORMAL LOW (ref 0.7–4.0)
MCH: 31 pg (ref 26.0–34.0)
MCHC: 33.2 g/dL (ref 30.0–36.0)
MCV: 93.4 fL (ref 80.0–100.0)
Monocytes Absolute: 0.4 10*3/uL (ref 0.1–1.0)
Monocytes Relative: 7 %
Neutro Abs: 5.4 10*3/uL (ref 1.7–7.7)
Neutrophils Relative %: 87 %
Platelets: 100 10*3/uL — ABNORMAL LOW (ref 150–400)
RBC: 3.32 MIL/uL — ABNORMAL LOW (ref 3.87–5.11)
RDW: 14 % (ref 11.5–15.5)
WBC: 6.2 10*3/uL (ref 4.0–10.5)
nRBC: 0 % (ref 0.0–0.2)

## 2022-11-18 LAB — BASIC METABOLIC PANEL
Anion gap: 10 (ref 5–15)
BUN: 106 mg/dL — ABNORMAL HIGH (ref 8–23)
CO2: 23 mmol/L (ref 22–32)
Calcium: 8.2 mg/dL — ABNORMAL LOW (ref 8.9–10.3)
Chloride: 102 mmol/L (ref 98–111)
Creatinine, Ser: 2.05 mg/dL — ABNORMAL HIGH (ref 0.44–1.00)
GFR, Estimated: 23 mL/min — ABNORMAL LOW (ref >60)
Glucose, Bld: 126 mg/dL — ABNORMAL HIGH (ref 70–99)
Potassium: 4.4 mmol/L (ref 3.5–5.1)
Sodium: 135 mmol/L (ref 135–145)

## 2022-11-18 LAB — MAGNESIUM: Magnesium: 2.3 mg/dL (ref 1.7–2.4)

## 2022-11-18 LAB — HEPARIN LEVEL (UNFRACTIONATED): Heparin Unfractionated: 0.31 IU/mL (ref 0.30–0.70)

## 2022-11-18 LAB — GLUCOSE, CAPILLARY: Glucose-Capillary: 181 mg/dL — ABNORMAL HIGH (ref 70–99)

## 2022-11-18 MED ORDER — ALBUTEROL SULFATE (2.5 MG/3ML) 0.083% IN NEBU: 3.0000 mL | INHALATION_SOLUTION | Freq: Four times a day (QID) | RESPIRATORY_TRACT | Status: DC | PRN

## 2022-11-18 MED ORDER — HYDRALAZINE HCL 25 MG PO TABS
25.0000 mg | ORAL_TABLET | Freq: Three times a day (TID) | ORAL | Status: DC
  Administered 2022-11-18 – 2022-11-20 (×7): 25 mg via ORAL
  Filled 2022-11-18 (×7): qty 1

## 2022-11-18 MED ORDER — PREDNISONE 20 MG PO TABS
10.0000 mg | ORAL_TABLET | Freq: Every day | ORAL | Status: DC
  Administered 2022-11-21: 10 mg via ORAL
  Filled 2022-11-18: qty 1

## 2022-11-18 MED ORDER — DM-GUAIFENESIN ER 30-600 MG PO TB12
1.0000 | ORAL_TABLET | Freq: Two times a day (BID) | ORAL | Status: DC
  Administered 2022-11-18 – 2022-11-21 (×7): 1 via ORAL
  Filled 2022-11-18 (×7): qty 1

## 2022-11-18 MED ORDER — PREDNISONE 20 MG PO TABS
20.0000 mg | ORAL_TABLET | Freq: Every day | ORAL | Status: AC
  Administered 2022-11-19 – 2022-11-20 (×2): 20 mg via ORAL
  Filled 2022-11-18 (×2): qty 1

## 2022-11-18 NOTE — Progress Notes (Signed)
Bipap is PRN order.  No distress noted at this time. 

## 2022-11-18 NOTE — TOC Initial Note (Addendum)
Transition of Care [facility]) - Initial/Assessment Note    Patient Details  Name: Lisa [name] MRN: [id number] Date of Birth: December 23, 1934  Transition of Care (TOC) CM/SW Contact:    Catalina Gravel, LCSW Phone Number: 11/18/2022, 5:36 PM  Clinical Narrative:                 CSW visited pt at bedside. Pt agrees that she has family members visit and care for her. [name] is her niece who we can speak to and makes decisions for her per pt. CSW asked pt if she is open to having in home PT or OT if recommended to get her stronger and she was agreeable.   CSW also called Ms Christell Constant on file, agreed family assists  but these are adult persons, nephew in 48's. Family would also like eval HH if available or recommended. Secure chat to MD by CSW sharing info. Also niece concerned she may be over income for AL but family may begin to think about more assistance. TOC to follow.  Expected Discharge Plan: Home/Self Care Barriers to Discharge: Continued Medical Work up   Patient Goals and CMS Choice Patient states their goals for this hospitalization and ongoing recovery are:: return home   Choice offered to / list presented to : Patient  ownership interest in [facility].provided to::  (n/a)    Expected Discharge Plan and Services In-house Referral: Clinical Social Work     Living arrangements for the past 2 months: Single Family Home                                      Prior Living Arrangements/Services Living arrangements for the past 2 months: Single Family Home Lives with:: Self Patient language and need for interpreter reviewed:: Yes Do you feel safe going back to the place where you live?: Yes      Need for Family Participation in Patient Care: Yes (Comment) Care giver support system in place?: Yes (comment) Current home services: DME (cane, walker, wheelchair, BSC, shower chair) Criminal Activity/Legal Involvement Pertinent to Current  Situation/Hospitalization: No - Comment as needed  Activities of Daily Living Home Assistive Devices/Equipment: Walker (specify type) ADL Screening (condition at time of admission) Patient's cognitive ability adequate to safely complete daily activities?: No Is the patient deaf or have difficulty hearing?: No Does the patient have difficulty seeing, even when wearing glasses/contacts?: No Does the patient have difficulty concentrating, remembering, or making decisions?: Yes Patient able to express need for assistance with ADLs?: Yes Does the patient have difficulty dressing or bathing?: Yes Independently performs ADLs?: No Communication: Independent Dressing (OT): Needs assistance Is this a change from baseline?: Pre-admission baseline Grooming: Needs assistance Is this a change from baseline?: Pre-admission baseline Feeding: Independent with device (comment) Bathing: Needs assistance Is this a change from baseline?: Pre-admission baseline Toileting: Needs assistance Is this a change from baseline?: Pre-admission baseline In/Out Bed: Needs assistance Is this a change from baseline?: Pre-admission baseline Walks in Home: Needs assistance Is this a change from baseline?: Pre-admission baseline Does the patient have difficulty walking or climbing stairs?: Yes Weakness of Legs: Both Weakness of Arms/Hands: Both  Permission Sought/Granted                  Emotional Assessment     Affect (typically observed): Appropriate Orientation: : Oriented to Self, Oriented to Place, Oriented to  Time, Oriented to Situation Alcohol / Substance Use: Not Applicable Psych Involvement: No (comment)  Admission diagnosis:  Acute respiratory distress [R06.03] Renal insufficiency [N28.9] Acute respiratory failure with hypoxia (HCC) [J96.01] Acute on chronic respiratory failure with hypoxia (HCC) [J96.21] Acute congestive heart failure, unspecified heart failure type [facility]) [I50.9] Patient  Active Problem List   Diagnosis Date Noted   Acute congestive heart failure (HCC) 11/18/2022   Acute on chronic heart failure with preserved ejection fraction (HCC) 11/17/2022   Acute hypoxic respiratory failure (HCC) 11/15/2022   Acute respiratory distress 11/15/2022   Need for pneumococcal 20-valent conjugate vaccination 10/16/2022   Dysmetria 09/04/2022   Encounter for general adult medical examination with abnormal findings 09/04/2022   COVID-19 12/29/2021   COVID-19 virus infection 12/28/2021   Elevated brain natriuretic peptide (BNP) level 12/28/2021   Elevated troponin 12/28/2021   Chronic kidney disease, stage 3b (HCC) 12/28/2021   FTT (failure to thrive) in adult 12/28/2021   Underweight 12/28/2021   Pain and swelling of forearm, left 12/28/2021   COPD with acute exacerbation (HCC) 12/27/2021   Malignant neoplasm of upper lobe of left lung (HCC) 09/15/2021   Adenocarcinoma of left lung (HCC) 09/11/2021   Multinodular goiter 09/05/2021   Odynophagia 09/03/2021   Mass of upper lobe of left lung 09/03/2021   Hypertensive urgency 09/02/2021   Chest pain 09/02/2021   CKD (chronic kidney disease) stage 4, GFR 15-29 ml/min (HCC) 09/02/2021   Tobacco abuse 09/02/2021   Myeloma (HCC) 09/02/2021   Non-ST elevation (NSTEMI) myocardial infarction [facility])    Demand ischemia    Iron deficiency anemia 07/29/2021   Anemia due to stage 4 chronic kidney disease (HCC) 07/22/2021   Chronic hepatitis C (HCC) 07/22/2021   Smoldering multiple myeloma 06/21/2021   Abnormal LFTs (liver function tests)    Calculus of gallbladder without cholecystitis without obstruction    Gallstone pancreatitis 09/13/2018   Protein-calorie malnutrition, severe (HCC) 05/09/2014   Sepsis (HCC) 05/07/2014   Encephalopathy 05/07/2014   Back pain 05/07/2014   AKI (acute kidney injury) (HCC)    Bacteremia    COLD (chronic obstructive lung disease) (HCC)    Viridans streptococci infection    H/O goiter 04/10/2014    Moderate protein-calorie malnutrition (HCC) 04/10/2014   Acute encephalopathy 04/09/2014   Streptococcus viridans infection 04/08/2014   Leukocytosis 04/07/2014   Dehydration 04/07/2014   Fever 04/06/2014   UTI (lower urinary tract infection) 04/06/2014   ARF (acute renal failure) (HCC) 04/06/2014   Hypertension    COPD (chronic obstructive pulmonary disease) (HCC)    PCP:  [name], MD Pharmacy:   [facility] [place], [place] - [address] [place] [address] Phone: [phone] Fax: [phone]     Social Determinants of Health (SDOH) Social History: SDOH Screenings   Food Insecurity: No Food Insecurity (11/15/2022)  Housing: Low Risk  (11/15/2022)  Transportation Needs: No Transportation Needs (11/15/2022)  Utilities: Not At Risk (11/15/2022)  Alcohol Screen: Low Risk  (10/24/2022)  Depression (PHQ2-9): Low Risk  (10/24/2022)  Financial Resource Strain: Low Risk  (10/24/2022)  Physical Activity: Inactive (10/24/2022)  Social Connections: Moderately Isolated (10/24/2022)  Stress: No Stress Concern Present (10/24/2022)  Tobacco Use: Medium Risk (11/16/2022)   SDOH Interventions:     Readmission Risk Interventions    11/16/2022    1:13 PM 09/06/2021    9:23 AM  Readmission Risk Prevention Plan  Transportation Screening Complete Complete  PCP or Specialist Appt within 3-5 Days  Complete  HRI or Home Care Consult Complete Complete  Social Work Consult for Recovery Care Planning/Counseling Complete Complete  Palliative Care Screening Complete Not Applicable  Medication Review Oceanographer) Complete Complete

## 2022-11-18 NOTE — Progress Notes (Signed)
Critical Note  RN called due to patient complaining of shortness of breath O2 sat of 100% on 3 LPM, she was reported to be uncomfortable and using accessory muscles for breathing.  At bedside, she was not in an acute distress, though, she was anxious.  She was able to complete sentences without being short of breath and she requests for albuterol inhaler.   Physical Exam BP (!) 190/60   Pulse 76   Temp 98.6 F (37 C) (Oral)   Resp (!) 25   Wt 47.7 kg   SpO2 100%   BMI 16.47 kg/m    Gen:- Awake Alert, oriented x 3 HEENT:- [place].AT, No sclera icterus Neck-Supple Neck,No JVD,.  Lungs-coarse breath sounds with scattered wheezes CV- S1, S2 normal Abd-  +ve B.Sounds, Abd Soft, No tenderness,    Extremity/Skin:- No  edema,   skin warm and dry Psych-affect is appropriate, oriented x3 Neuro-no new focal deficits, no tremors  Assessment and plan  COPD with acute exacerbation Continue management per primary team Incentive telemetry, flutter valve, Mucinex were started Continue albuterol inhaler  Please refer to primary team's progress note for details regarding the care of this patient  Critical time: 32 minutes   Critical care personally provided  managing the patient due to high probability of clinically significant and life threatening deterioration. This critical care time included obtaining a history; examining the patient, pulse oximetry; ordering and review of studies; arranging urgent treatment with development of a management plan; evaluation of patient's response of treatment; frequent reassessment; and discussions with other providers.  This critical care time was performed to assess and manage the high probability of imminent and life threatening deterioration that could result in multi-organ failure.

## 2022-11-18 NOTE — Progress Notes (Signed)
ANTICOAGULATION CONSULT NOTE   Pharmacy Consult for heparin Indication: chest pain/ACS  No Known Allergies  Patient Measurements: Weight: 47.7 kg (105 lb 2.6 oz) Heparin Dosing Weight: 47 kg  Vital Signs: Temp: 97.6 F (36.4 C) (06/08 0725) Temp Source: Oral (06/08 0725) BP: 190/60 (06/08 0600) Pulse Rate: 76 (06/08 0600)  Labs: Recent Labs    11/15/22 2213 11/16/22 0003 11/16/22 0421 11/16/22 0939 11/17/22 0145 11/17/22 1437 11/18/22 0359  HGB  --   --  9.0*  --  7.9*  --  10.3*  HCT  --   --  27.9*  --  24.0*  --  31.0*  PLT  --   --  102*  --  102*  --  100*  HEPARINUNFRC  --   --   --   --  0.25* 0.37 0.31  CREATININE  --   --  1.86*  --  2.18*  --  2.05*  TROPONINIHS 692* 504*  --  12,379*  --   --   --      Estimated Creatinine Clearance: 15.1 mL/min (A) (by C-G formula based on SCr of 2.05 mg/dL (H)).   Medical History: Past Medical History:  Diagnosis Date   Anemia    Carotid artery stenosis    CKD (chronic kidney disease)    COPD (chronic obstructive pulmonary disease) (HCC)    Dyspnea    Due to COPD per patient   Goiter diffuse, nontoxic    Per patient   Hepatitis C    History of radiation therapy    Left lung-10/10/21-10/17/21- Dr. Antony Blackbird   Hypertension    Monoclonal gammopathy of unknown significance (MGUS)    Pyelonephritis 04/06/2014   UTI   Assessment: Pharmacy consulted to dose heparin in patient with NSTEMI.  Patient is not on anticoagulation prior to admit. Heparin level now at goal on 650 units/hr. No bleeding issues noted however patient did receive a unit of blood 6/7 for hgb of 7.9, responded and hgb now 10.3.   Goal of Therapy:  Heparin level 0.3-0.7 units/ml Monitor platelets by anticoagulation protocol: Yes   Plan:  Continue heparin at 650 units/hr Planning medical management - 72 hours of heparin  Sheppard Coil PharmD., BCPS Clinical Pharmacist 11/18/2022 8:01 AM

## 2022-11-18 NOTE — Progress Notes (Addendum)
PROGRESS NOTE   Danine Boughner  WUJ:[id number] DOB: December 24, 1934 DOA: 11/15/2022 PCP: [name], MD   Chief Complaint  Patient presents with   Shortness of Breath   Level of care: Stepdown  Brief Admission History:  88 year old female with stage IIIb CKD, chronic hepatitis C, former heavy tobacco user, hypertension, COPD, malignant neoplasm of left lung, treated with radiation, smoldering myeloma, goiter, monoclonal gammopathy of unknown significance, chronic dyspnea, coronary artery disease, anemia and CKD, diastolic heart failure with failure to thrive presented to the emergency department by EMS with acute onset of severe shortness of breath.  She has had progressive worsening shortness of breath since yesterday.  She started showing more signs of respiratory stridor and grunting and EMS noted pulse ox 85% on room air.  She complained of chest tightness and chest pain.  She is not normally on supplemental oxygen but was placed on nasal cannula en route.  Patient was severely hypertensive on arrival with systolic blood pressure of 238.  She was given albuterol bronchodilator treatment and IV steroids.  Her chest x-ray and CT chest showed chronic findings.  Unfortunately her respiratory distress progressed and she required continuous BiPAP therapy.  She was not able to have IV contrast for CTA chest and VQ scan not completed.  She is being admitted for acute respiratory distress presumably secondary to his COPD exacerbation.  After discussion with her healthcare surrogate decision made to make patient DNR but continue BiPAP treatments and stepdown ICU care.  Influenza and COVID testing negative.   Assessment and Plan:  Acute on chronic respiratory failure with hypoxia -multifactorial causes including acute heart failure and acute MI  -Patient presents with acute severe respiratory distress requiring continuous BiPAP therapy -Goals of care discussion with healthcare surrogate patient made  DNR -Continue IV steroids thru 6/8, then change to oral prednisone, continue scheduled bronchodilators  -Continue supplemental oxygen -intermittent dyspnea spells with anxiety and panic attacks -repeat portable CXR today    Acute Encephalopathy - resolved  -secondary to hypercarbia related to acute respiratory failure -mentation improved after bipap treatment -ABG reassuring   Anemia of chronic disease -Hg down to 7.8, goal is >8 -transfused 1 unit PRBC 6/7 -hg 10.3 on 6/8  NSTEMI -appreciate cardiology consultation and recommendations -treating medically per ACS protocol per Dr. Jenene Slicker -goals of care discussion with palliative medicine team ongoing  -pt decided today no further invasive testing desired and she does not want cath or interventions -plan is to treat MI medically -complete 72 hours of IV heparin  -continue aspirin 81 mg daily, add plavix load 300 mg followed by 75 mg daily per cardiologist    COPD with acute exacerbation  -continue IV methylprednisolone but weaning dose as respiratory distress improves -continue IV antibiotics -continue scheduled bronchodilators    Severe protein calorie malnutrition -pt is severely emaciated -will request dietitian consultation   Adult Failure to thrive  -IPAL meeting for goals of care discussion with brother - healthcare decision maker -after discussions, decision made by brother to make DNR  -palliative medicine consultation was requested  -ongoing goals of care discussion   Stage IV CKD  -monitor Cr closely as it has been labile -renally dose medication    Elevated BNP -likely acute exacerbation of diastolic heart failure in setting of severe respiratory distress -IV lasix treatment by cardiology team, currently on hold  Intake/Output Summary (Last 24 hours) at 11/18/2022 0949 Last data filed at 11/18/2022 0500 Gross per 24 hour  Intake 1684.16  ml  Output 300 ml  Net 1384.16 ml   Filed Weights   11/15/22 1712  11/18/22 0405  Weight: 47.1 kg 47.7 kg     Hypertensive Urgency / Uncontrolled HTN -severely elevated BPs likely related to acute respiratory failure -Pt presented with severe respiratory distress  -IV hydralazine ordered, IV metoprolol ordered -cardiology made some med changes today -cardiology added carvedilol 12.5 mg BID and increased dose of imdur to 60 mg  -BP remains high and we are adding hydralazine 25 mg TID    Adenocarcinoma / Mass of Left upper lung -s/p radiation treatments by Dr. Roselind Messier 5/1-10/17/2021 -CT and CXR show stable findings likely residual scarring -Follow up with Dr. Ellin Saba for ongoing surveillance as scheduled   MGUS / smoldering multiple myeloma - she is being followed for this by Dr. Ellin Saba    DVT prophylaxis: IV heparin infusion x 72 hours Code Status: DNR  Family Communication: brother  Disposition: Status is: Inpatient Remains inpatient appropriate because: intensity    Consultants:  Cardiology Palliative medicine  Procedures:   Antimicrobials:    Subjective: Pt had episode of SOB last night but consistent with a panic episode from anxiety; no CP. No SOB this morning.   Objective: Vitals:   11/18/22 0500 11/18/22 0600 11/18/22 0725 11/18/22 0844  BP: (!) 183/72 (!) 190/60  (!) 190/60  Pulse:  76    Resp: 15 (!) 25    Temp:   97.6 F (36.4 C)   TempSrc:   Oral   SpO2:  100% 100%   Weight:        Intake/Output Summary (Last 24 hours) at 11/18/2022 0949 Last data filed at 11/18/2022 0500 Gross per 24 hour  Intake 1684.16 ml  Output 300 ml  Net 1384.16 ml   Filed Weights   11/15/22 1712 11/18/22 0405  Weight: 47.1 kg 47.7 kg   Examination:  General exam: Appears chronically ill on [place], frail, cachectic, good  mentation, alert, cooperative, calm and comfortable  Respiratory system: BBS clear to auscultation.  Cardiovascular system: normal S1 & S2 heard. No JVD, murmurs, rubs, gallops or clicks. No pedal edema. Gastrointestinal  system: Abdomen is nondistended, soft and nontender. No organomegaly or masses felt. Normal bowel sounds heard. Central nervous system: Alert and oriented. No focal neurological deficits. Extremities: Symmetric 5 x 5 power. Skin: No rashes, lesions or ulcers. Psychiatry: Judgement and insight appear normal. Mood & affect appropriate.   Data Reviewed: I have personally reviewed following labs and imaging studies  CBC: Recent Labs  Lab 11/15/22 0805 11/16/22 0421 11/17/22 0145 11/18/22 0359  WBC 4.9 3.4* 5.7 6.2  NEUTROABS 3.3 2.9 5.0 5.4  HGB 9.2* 9.0* 7.9* 10.3*  HCT 28.6* 27.9* 24.0* 31.0*  MCV 97.3 97.2 94.5 93.4  PLT 91* 102* 102* 100*    Basic Metabolic Panel: Recent Labs  Lab 11/15/22 0805 11/16/22 0421 11/17/22 0145 11/18/22 0359  NA 135 138 137 135  K 3.9 4.1 4.2 4.4  CL 103 103 103 102  CO2 25 23 24 23   GLUCOSE 152* 122* 127* 126*  BUN 65* 73* 99* 106*  CREATININE 1.80* 1.86* 2.18* 2.05*  CALCIUM 8.7* 8.7* 8.2* 8.2*  MG  --  2.0 2.2 2.3    CBG: No results for input(s): "GLUCAP" in the last 168 hours.  Recent Results (from the past 240 hour(s))  Resp panel by RT-PCR (RSV, Flu A&B, Covid) Anterior Nasal Swab     Status: None   Collection Time: 11/15/22  8:12  AM   Specimen: Anterior Nasal Swab  Result Value Ref Range Status   SARS Coronavirus 2 by RT PCR NEGATIVE NEGATIVE Final    Comment: (NOTE) SARS-CoV-2 target nucleic acids are NOT DETECTED.  The SARS-CoV-2 RNA is generally detectable in upper respiratory specimens during the acute phase of infection. The lowest concentration of SARS-CoV-2 viral copies this assay can detect is 138 copies/mL. A negative result does not preclude SARS-Cov-2 infection and should not be used as the sole basis for treatment or other patient management decisions. A negative result may occur with  improper specimen collection/handling, submission of specimen other than nasopharyngeal swab, presence of viral mutation(s)  within the areas targeted by this assay, and inadequate number of viral copies(<138 copies/mL). A negative result must be combined with clinical observations, patient history, and epidemiological information. The expected result is Negative.  Fact Sheet for Patients:  BloggerCourse.com  Fact Sheet for Healthcare Providers:  SeriousBroker.it  This test is no t yet approved or cleared by the [place] FDA and  has been authorized for detection and/or diagnosis of SARS-CoV-2 by FDA under an Emergency Use Authorization (EUA). This EUA will remain  in effect (meaning this test can be used) for the duration of the COVID-19 declaration under Section 564(b)(1) of the Act, 21 U.S.C.section 360bbb-3(b)(1), unless the authorization is terminated  or revoked sooner.       Influenza A by PCR NEGATIVE NEGATIVE Final   Influenza B by PCR NEGATIVE NEGATIVE Final    Comment: (NOTE) The Xpert Xpress SARS-CoV-2/FLU/RSV plus assay is intended as an aid in the diagnosis of influenza from Nasopharyngeal swab specimens and should not be used as a sole basis for treatment. Nasal washings and aspirates are unacceptable for Xpert Xpress SARS-CoV-2/FLU/RSV testing.  Fact Sheet for Patients: BloggerCourse.com  Fact Sheet for Healthcare Providers: SeriousBroker.it  This test is not yet approved or cleared by the [place] FDA and has been authorized for detection and/or diagnosis of SARS-CoV-2 by FDA under an Emergency Use Authorization (EUA). This EUA will remain in effect (meaning this test can be used) for the duration of the COVID-19 declaration under Section 564(b)(1) of the Act, 21 U.S.C. section 360bbb-3(b)(1), unless the authorization is terminated or revoked.     Resp Syncytial Virus by PCR NEGATIVE NEGATIVE Final    Comment: (NOTE) Fact Sheet for  Patients: BloggerCourse.com  Fact Sheet for Healthcare Providers: SeriousBroker.it  This test is not yet approved or cleared by the [place] FDA and has been authorized for detection and/or diagnosis of SARS-CoV-2 by FDA under an Emergency Use Authorization (EUA). This EUA will remain in effect (meaning this test can be used) for the duration of the COVID-19 declaration under Section 564(b)(1) of the Act, 21 U.S.C. section 360bbb-3(b)(1), unless the authorization is terminated or revoked.  Performed at [facility], [address] [place], [place] [address]   MRSA Next Gen by PCR, Nasal     Status: None   Collection Time: 11/15/22  5:57 PM   Specimen: Nasal Mucosa; Nasal Swab  Result Value Ref Range Status   MRSA by PCR Next Gen NOT DETECTED NOT DETECTED Final    Comment: (NOTE) The GeneXpert MRSA Assay (FDA approved for NASAL specimens only), is one component of a comprehensive MRSA colonization surveillance program. It is not intended to diagnose MRSA infection nor to guide or monitor treatment for MRSA infections. Test performance is not FDA approved in patients less than 69 years old. Performed at [facility],  [address], [place], [place] [address]      Radiology Studies: ECHOCARDIOGRAM COMPLETE  Result Date: 11/16/2022    ECHOCARDIOGRAM REPORT   Patient Name:   BETHIA HAUSWIRTH DAVIS Date of Exam: 11/16/2022 Medical Rec #:  [id number]          Height:       67.0 in Accession #:    [id number]         Weight:       103.8 lb Date of Birth:  08/07/1934           BSA:          1.531 m Patient Age:    87 years           BP:           161/69 mmHg Patient Gender: F                  HR:           88 bpm. [facility] Procedure: 2D Echo, Color Doppler and Cardiac Doppler Indications:    acute diastolic chf  History:        Patient has prior history of Echocardiogram examinations, most                 recent 12/28/2021. COPD  and chronic kidney disease,                 Signs/Symptoms:elevated troponin; Risk Factors:Hypertension.  Sonographer:    Delcie Roch RDCS Referring Phys: [id number] Lennart Pall STRADER IMPRESSIONS  1. Left ventricular ejection fraction, by estimation, is 55 to 60%. The left ventricle has normal function. The left ventricle demonstrates regional wall motion abnormalities (see scoring diagram/findings for description). Left ventricular diastolic parameters are consistent with Grade II diastolic dysfunction (pseudonormalization).  2. Right ventricular systolic function is normal. The right ventricular size is normal. There is severely elevated pulmonary artery systolic pressure.  3. Left atrial size was mildly dilated.  4. The mitral valve is abnormal. Mild mitral valve regurgitation. No evidence of mitral stenosis. Severe mitral annular calcification.  5. The aortic valve has an indeterminant number of cusps. Aortic valve regurgitation is not visualized. No aortic stenosis is present.  6. The inferior vena cava is dilated in size with >50% respiratory variability, suggesting right atrial pressure of 8 mmHg. Comparison(s): Prior images reviewed side by side. Changes from prior study are noted. RWMA with akinesis in the basal inferior wall and basal inferoseptal wall are new. FINDINGS  Left Ventricle: Left ventricular ejection fraction, by estimation, is 55 to 60%. The left ventricle has normal function. The left ventricle demonstrates regional wall motion abnormalities. The left ventricular internal cavity size was normal in size. There is no left ventricular hypertrophy. Left ventricular diastolic parameters are consistent with Grade II diastolic dysfunction (pseudonormalization).  LV Wall Scoring: The basal inferior segment and basal inferoseptal segment are akinetic. Right Ventricle: The right ventricular size is normal. No increase in right ventricular wall thickness. Right ventricular systolic function is  normal. There is severely elevated pulmonary artery systolic pressure. The tricuspid regurgitant velocity is 4.25 m/s, and with an assumed right atrial pressure of 8 mmHg, the estimated right ventricular systolic pressure is 80.2 mmHg. Left Atrium: Left atrial size was mildly dilated. Right Atrium: Right atrial size was normal in size. Pericardium: There is no evidence of pericardial effusion. Mitral Valve: The mitral valve is abnormal. Severe mitral annular calcification. Mild mitral valve regurgitation. No  evidence of mitral valve stenosis. Tricuspid Valve: The tricuspid valve is grossly normal. Tricuspid valve regurgitation is mild . No evidence of tricuspid stenosis. Aortic Valve: The aortic valve has an indeterminant number of cusps. Aortic valve regurgitation is not visualized. No aortic stenosis is present. Pulmonic Valve: The pulmonic valve was not well visualized. Pulmonic valve regurgitation is trivial. No evidence of pulmonic stenosis. Aorta: The aortic root is normal in size and structure. Venous: The inferior vena cava is dilated in size with greater than 50% respiratory variability, suggesting right atrial pressure of 8 mmHg. IAS/Shunts: No atrial level shunt detected by color flow Doppler.  LEFT VENTRICLE PLAX 2D LVIDd:         4.00 cm   Diastology LVIDs:         3.00 cm   LV e' medial:    5.44 cm/s LV PW:         1.20 cm   LV E/e' medial:  25.9 LV IVS:        1.10 cm   LV e' lateral:   6.96 cm/s LVOT diam:     1.50 cm   LV E/e' lateral: 20.3 LV SV:         24 LV SV Index:   16 LVOT Area:     1.77 cm  RIGHT VENTRICLE             IVC RV Basal diam:  2.70 cm     IVC diam: 2.50 cm RV S prime:     12.90 cm/s TAPSE (M-mode): 2.4 cm LEFT ATRIUM             Index        RIGHT ATRIUM           Index LA diam:        4.00 cm 2.61 cm/m   RA Area:     11.40 cm LA Vol (A2C):   50.9 ml 33.26 ml/m  RA Volume:   23.90 ml  15.62 ml/m LA Vol (A4C):   56.9 ml 37.18 ml/m LA Biplane Vol: 56.3 ml 36.78 ml/m  AORTIC  VALVE LVOT Vmax:   78.00 cm/s LVOT Vmean:  49.700 cm/s LVOT VTI:    0.137 m  AORTA Ao Root diam: 2.60 cm MITRAL VALVE                TRICUSPID VALVE MV Area (PHT): 4.60 cm     TR Peak grad:   72.2 mmHg MV Decel Time: 165 msec     TR Vmax:        425.00 cm/s MV E velocity: 141.00 cm/s MV A velocity: 72.80 cm/s   SHUNTS MV E/A ratio:  1.94         Systemic VTI:  0.14 m                             Systemic Diam: 1.50 cm Vishnu Priya Mallipeddi Electronically signed by Winfield Rast Mallipeddi Signature Date/Time: 11/16/2022/1:41:45 PM    Final     Scheduled Meds:  sodium chloride   Intravenous Once   aspirin EC  81 mg Oral q AM   carvedilol  12.5 mg Oral BID WC   Chlorhexidine Gluconate Cloth  6 each Topical Daily   dextromethorphan-guaiFENesin  1 tablet Oral BID   hydrALAZINE  25 mg Oral Q8H   ipratropium-albuterol  3 mL Nebulization TID   isosorbide mononitrate  60 mg Oral Daily  methylPREDNISolone (SOLU-MEDROL) injection  20 mg Intravenous Daily   rosuvastatin  20 mg Oral q1800   Continuous Infusions:  azithromycin 500 mg (11/17/22 1609)   cefTRIAXone (ROCEPHIN)  IV 1 g (11/17/22 1528)   heparin 650 Units/hr (11/18/22 0256)     LOS: 3 days   Authorized and Performed by: Maryln Manuel MD  Total  time: 38 mins   Khali Perella Laural Benes, MD How to contact the [facility] Attending or Consulting provider 7A - 7P or covering provider during after hours 7P -7A, for this patient?  Check the care team in [facility] and look for a) attending/consulting [facility] provider listed and b) the [facility] team listed Log into [email or web address] and use [place]'s universal password to access. If you do not have the password, please contact the hospital operator. Locate the [facility] provider you are looking for under [facility] and page to a number that you can be directly reached. If you still have difficulty reaching the provider, please page the [facility] (Director on Call) for the Hospitalists listed on amion for assistance.  11/18/2022,  9:49 AM

## 2022-11-19 DIAGNOSIS — J9601 Acute respiratory failure with hypoxia: Secondary | ICD-10-CM | POA: Diagnosis not present

## 2022-11-19 DIAGNOSIS — I509 Heart failure, unspecified: Secondary | ICD-10-CM | POA: Diagnosis not present

## 2022-11-19 DIAGNOSIS — I214 Non-ST elevation (NSTEMI) myocardial infarction: Secondary | ICD-10-CM | POA: Diagnosis not present

## 2022-11-19 LAB — CBC
HCT: 28.5 % — ABNORMAL LOW (ref 36.0–46.0)
Hemoglobin: 9.3 g/dL — ABNORMAL LOW (ref 12.0–15.0)
MCH: 30.5 pg (ref 26.0–34.0)
MCHC: 32.6 g/dL (ref 30.0–36.0)
MCV: 93.4 fL (ref 80.0–100.0)
Platelets: 88 10*3/uL — ABNORMAL LOW (ref 150–400)
RBC: 3.05 MIL/uL — ABNORMAL LOW (ref 3.87–5.11)
RDW: 13.6 % (ref 11.5–15.5)
WBC: 4.8 10*3/uL (ref 4.0–10.5)
nRBC: 0 % (ref 0.0–0.2)

## 2022-11-19 LAB — BASIC METABOLIC PANEL
Anion gap: 8 (ref 5–15)
BUN: 116 mg/dL — ABNORMAL HIGH (ref 8–23)
CO2: 24 mmol/L (ref 22–32)
Calcium: 8.2 mg/dL — ABNORMAL LOW (ref 8.9–10.3)
Chloride: 103 mmol/L (ref 98–111)
Creatinine, Ser: 1.92 mg/dL — ABNORMAL HIGH (ref 0.44–1.00)
GFR, Estimated: 25 mL/min — ABNORMAL LOW (ref >60)
Glucose, Bld: 122 mg/dL — ABNORMAL HIGH (ref 70–99)
Potassium: 4.6 mmol/L (ref 3.5–5.1)
Sodium: 135 mmol/L (ref 135–145)

## 2022-11-19 LAB — BPAM RBC
Blood Product Expiration Date: 202407082359
ISSUE DATE / TIME: 202406071011

## 2022-11-19 LAB — TYPE AND SCREEN
ABO/RH(D): O POS
Unit division: 0

## 2022-11-19 LAB — HEPARIN LEVEL (UNFRACTIONATED): Heparin Unfractionated: 0.44 IU/mL (ref 0.30–0.70)

## 2022-11-19 MED ORDER — ADULT MULTIVITAMIN W/MINERALS CH
1.0000 | ORAL_TABLET | Freq: Every day | ORAL | Status: DC
  Administered 2022-11-19 – 2022-11-21 (×3): 1 via ORAL
  Filled 2022-11-19 (×3): qty 1

## 2022-11-19 MED ORDER — CLOPIDOGREL BISULFATE 75 MG PO TABS
300.0000 mg | ORAL_TABLET | Freq: Once | ORAL | Status: AC
  Administered 2022-11-19: 300 mg via ORAL
  Filled 2022-11-19: qty 4

## 2022-11-19 MED ORDER — CLOPIDOGREL BISULFATE 75 MG PO TABS
75.0000 mg | ORAL_TABLET | Freq: Every day | ORAL | Status: DC
  Administered 2022-11-20 – 2022-11-21 (×2): 75 mg via ORAL
  Filled 2022-11-19 (×2): qty 1

## 2022-11-19 MED ORDER — HEPARIN SODIUM (PORCINE) 5000 UNIT/ML IJ SOLN
5000.0000 [IU] | Freq: Three times a day (TID) | INTRAMUSCULAR | Status: DC
  Administered 2022-11-19 – 2022-11-21 (×6): 5000 [IU] via SUBCUTANEOUS
  Filled 2022-11-19 (×6): qty 1

## 2022-11-19 MED ORDER — ENSURE ENLIVE PO LIQD
237.0000 mL | Freq: Two times a day (BID) | ORAL | Status: DC
  Administered 2022-11-19 – 2022-11-21 (×4): 237 mL via ORAL

## 2022-11-19 NOTE — Progress Notes (Signed)
ANTICOAGULATION CONSULT NOTE   Pharmacy Consult for heparin Indication: chest pain/ACS  No Known Allergies  Patient Measurements: Weight: 46.5 kg (102 lb 8.2 oz) Heparin Dosing Weight: 47 kg  Vital Signs: Temp: 97.8 F (36.6 C) (06/09 0736) Temp Source: Axillary (06/09 0736) BP: 155/60 (06/09 0730) Pulse Rate: 86 (06/09 0730)  Labs: Recent Labs    11/16/22 0939 11/17/22 0145 11/17/22 0145 11/17/22 1437 11/18/22 0359 11/19/22 0336  HGB  --  7.9*   < >  --  10.3* 9.3*  HCT  --  24.0*  --   --  31.0* 28.5*  PLT  --  102*  --   --  100* 88*  HEPARINUNFRC  --  0.25*   < > 0.37 0.31 0.44  CREATININE  --  2.18*  --   --  2.05* 1.92*  TROPONINIHS 12,379*  --   --   --   --   --    < > = values in this interval not displayed.     Estimated Creatinine Clearance: 15.7 mL/min (A) (by C-G formula based on SCr of 1.92 mg/dL (H)).   Medical History: Past Medical History:  Diagnosis Date   Anemia    Carotid artery stenosis    CKD (chronic kidney disease)    COPD (chronic obstructive pulmonary disease) (HCC)    Dyspnea    Due to COPD per patient   Goiter diffuse, nontoxic    Per patient   Hepatitis C    History of radiation therapy    Left lung-10/10/21-10/17/21- Dr. Antony Blackbird   Hypertension    Monoclonal gammopathy of unknown significance (MGUS)    Pyelonephritis 04/06/2014   UTI   Assessment: Pharmacy consulted to dose heparin in patient with NSTEMI.  Patient is not on anticoagulation prior to admit. Heparin level continues to be at goal on 650 units/hr. No bleeding issues noted however patient did receive a unit of blood 6/7 for hgb of 7.9, responded and hgb now 9.3.  Heparin to stop this evening.   Goal of Therapy:  Heparin level 0.3-0.7 units/ml Monitor platelets by anticoagulation protocol: Yes   Plan:  Continue heparin at 650 units/hr Planning medical management - 72 hours of heparin  Sheppard Coil PharmD., BCPS Clinical Pharmacist 11/19/2022 8:43  AM

## 2022-11-19 NOTE — Progress Notes (Signed)
Initial Nutrition Assessment  DOCUMENTATION CODES:   Underweight  INTERVENTION:  Trial Ensure Plus High Protein po BID, each supplement provides 350 kcal and 20 grams of protein.  MVI   If patient is an aspiration risk, she would benefit from a Dysphagia 3 Diet (mechanically soft) instead of a SOFT diet (low fiber)   Encourage po intake    NUTRITION DIAGNOSIS:   Underweight related to chronic illness as evidenced by percent weight loss, meal completion < 50%.   GOAL:   Patient will meet greater than or equal to 90% of their needs   MONITOR:   PO intake, Labs, I & O's, Supplement acceptance, Weight trends, Diet advancement, Skin  REASON FOR ASSESSMENT:   Consult Assessment of nutrition requirement/status  ASSESSMENT:   88 y.o. female with PMHx including CKD 3b, chronic Hep C, former heavy tobacco user, HTN, COPD, malignant neoplasm of L lung, s/p radiation, smoldering myeloma, goiter, monoclonal gammopathy of unknown significance, chronic dyspnea, CAD, anemia, and  diastolic HF with failure to thrive presents with acute onset of severe SOB. Patient admitted for acute respiratory distress presumable 2/2 COPD exacerbation   Unable to reach patient. Per MD notes, her breathing has stabilized with PRN BiPAP.   Patient has likely lost more weight that is being masked by fluid positivity.   RD to trial ONS and see how patient tolerates.   Labs: Glu 122, BUN 116, Cr 1.92 Meds: zithromax, rocephin, plavix, deltasone, crestor Wt: 3.2 kg (6%) wt loss x 5 months  11/19/22 46.5 kg  10/16/22 47.3 kg  09/04/22 47.6 kg  06/14/22 49.7 kg  PO: 15-75% po x 5 meal   I/O's: +2.3 L    NUTRITION - FOCUSED PHYSICAL EXAM:  RD working remotely   Diet Order:   Diet Order             DIET SOFT Fluid consistency: Thin  Diet effective now                   EDUCATION NEEDS:      Skin:  Skin Assessment: Reviewed RN Assessment  Last BM:  6/8  Height:   Ht Readings  from Last 1 Encounters:  10/16/22 5\' 7"  (1.702 m)    Weight:   Wt Readings from Last 1 Encounters:  11/19/22 46.5 kg    Ideal Body Weight:     BMI:  Body mass index is 16.06 kg/m.  Estimated Nutritional Needs:   Kcal:  1400-1650  Protein:  70 g  Fluid:  >1.5L   Leodis Rains, RDN, LDN  Clinical Nutrition

## 2022-11-19 NOTE — Progress Notes (Signed)
PROGRESS NOTE   Lisa [name]  WUJ:[id number] DOB: 09/25/1934 DOA: 11/15/2022 PCP: [name], MD   Chief Complaint  Patient presents with   Shortness of Breath   Level of care: Stepdown  Brief Admission History:  88 year old female with stage IIIb CKD, chronic hepatitis C, former heavy tobacco user, hypertension, COPD, malignant neoplasm of left lung, treated with radiation, smoldering myeloma, goiter, monoclonal gammopathy of unknown significance, chronic dyspnea, coronary artery disease, anemia and CKD, diastolic heart failure with failure to thrive presented to the emergency department by EMS with acute onset of severe shortness of breath.  She has had progressive worsening shortness of breath since yesterday.  She started showing more signs of respiratory stridor and grunting and EMS noted pulse ox 85% on room air.  She complained of chest tightness and chest pain.  She is not normally on supplemental oxygen but was placed on nasal cannula en route.  Patient was severely hypertensive on arrival with systolic blood pressure of 238.  She was given albuterol bronchodilator treatment and IV steroids.  Her chest x-ray and CT chest showed chronic findings.  Unfortunately her respiratory distress progressed and she required continuous BiPAP therapy.  She was not able to have IV contrast for CTA chest and VQ scan not completed.  She is being admitted for acute respiratory distress presumably secondary to his COPD exacerbation.  After discussion with her healthcare surrogate decision made to make patient DNR but continue BiPAP treatments and stepdown ICU care.  Influenza and COVID testing negative.   Assessment and Plan:  Acute respiratory failure with hypoxia -multifactorial causes including acute heart failure and acute MI  -Patient presented with acute severe respiratory distress requiring continuous BiPAP therapy -Goals of care discussion with healthcare surrogate patient made  DNR -Continued IV steroids thru 6/8, then changed to oral prednisone, bronchodilators per RT -weaned off supplemental oxygen -intermittent dyspnea spells with anxiety and panic attacks -repeated portable CXR 6/8 was reassuring     Acute Encephalopathy - resolved  -secondary to hypercarbia related to acute respiratory failure -mentation improved after bipap treatment -ABG reassuring   Anemia of chronic disease -Hg down to 7.8, goal is >8 -transfused 1 unit PRBC 6/7 -recheck CBC in AM   NSTEMI -appreciate cardiology consultation and recommendations -treating medically per ACS protocol per Dr. Jenene Slicker -goals of care discussions with palliative medicine team and cardiology    -pt decided no further invasive testing desired and she does not want cath or other interventions -plan is to treat MI medically -complete 72 hours of IV heparin on 6/9  -continue aspirin 81 mg daily, add plavix load 300 mg followed by 75 mg daily per cardiologist  -recheck CBC in AM, if Hg stable, anticipate DC home 6/10 with family providing 24/7 supervision   COPD with acute exacerbation - TREATED -continue IV methylprednisolone but weaning dose as respiratory distress improves -continue IV antibiotics to complete 5 days -continue scheduled bronchodilators, wean as able  -weaning steroids now    Severe protein calorie malnutrition -pt is severely emaciated -will request dietitian consultation   Adult Failure to thrive  -IPAL meeting for goals of care discussion with brother - healthcare decision maker -after discussions, decision made by brother to make DNR  -palliative medicine consultation was requested  -ongoing goals of care discussions recommended -she would benefit from outpatient palliative care if family were agreeable    Stage IV CKD  -monitor Cr closely as it has been labile -renally dose medication  -  bump in creatinine from diuresis, temporary diuretic holiday, creatinine coming  down -hopefully can restart diuretic on 6/10   Acute HFpEF -likely acute exacerbation of diastolic heart failure in setting of severe respiratory distress and acute MI -IV lasix treatment by cardiology team, currently on hold for diuretic holiday due to bump in creatinine -hopefully can restart diuretics on 6/10  Intake/Output Summary (Last 24 hours) at 11/19/2022 1055 Last data filed at 11/19/2022 0800 Gross per 24 hour  Intake 484.94 ml  Output 200 ml  Net 284.94 ml   Filed Weights   11/15/22 1712 11/18/22 0405 11/19/22 0513  Weight: 47.1 kg 47.7 kg 46.5 kg    Hypertensive Urgency / Uncontrolled HTN - improving  -presented with severely elevated BPs likely related to acute respiratory failure -Pt presented with severe respiratory distress which is now RESOLVED -cardiology added carvedilol 12.5 mg BID and increased dose of imdur to 60 mg  -BP remained high and we added hydralazine 25 mg TID    Adenocarcinoma / Mass of Left upper lung -s/p radiation treatments by Dr. Roselind Messier 5/1-10/17/2021 -CT and CXR show stable findings likely residual scarring -Follow up with Dr. Ellin Saba for ongoing surveillance as scheduled   MGUS / smoldering multiple myeloma - she is being followed for this by Dr. Ellin Saba    DVT prophylaxis: IV heparin infusion x 72 hours, followed by subcutaneous heparin  Code Status: DNR  Family Communication: brother  Disposition: anticipate DC home with HH on 6/10  Remains inpatient appropriate because: intensity    Consultants:  Cardiology Palliative medicine  Procedures:   Antimicrobials:    Subjective: Pt had no CP or SOB symptoms today which is a big improvement. She doesn't really like the food in hospital.   Objective: Vitals:   11/19/22 0729 11/19/22 0730 11/19/22 0736 11/19/22 0800  BP:  (!) 155/60  (!) 148/56  Pulse:  86    Resp:    19  Temp:   97.8 F (36.6 C)   TempSrc:   Axillary   SpO2: 100%     Weight:        Intake/Output Summary  (Last 24 hours) at 11/19/2022 1055 Last data filed at 11/19/2022 0800 Gross per 24 hour  Intake 484.94 ml  Output 200 ml  Net 284.94 ml   Filed Weights   11/15/22 1712 11/18/22 0405 11/19/22 0513  Weight: 47.1 kg 47.7 kg 46.5 kg   Examination:  General exam: Appears chronically ill, on room air oxygen, frail, cachectic, good  mentation, alert, cooperative, calm and comfortable  Respiratory system: BBS clear to auscultation.  Cardiovascular system: normal S1 & S2 heard. No JVD, murmurs, rubs, gallops or clicks. No pedal edema. Gastrointestinal system: Abdomen is nondistended, soft and nontender. No organomegaly or masses felt. Normal bowel sounds heard. Central nervous system: Alert and oriented. No focal neurological deficits. Extremities: Symmetric 5 x 5 power. Skin: No rashes, lesions or ulcers. Psychiatry: Judgement and insight appear normal. Mood & affect appropriate.   Data Reviewed: I have personally reviewed following labs and imaging studies  CBC: Recent Labs  Lab 11/15/22 0805 11/16/22 0421 11/17/22 0145 11/18/22 0359 11/19/22 0336  WBC 4.9 3.4* 5.7 6.2 4.8  NEUTROABS 3.3 2.9 5.0 5.4  --   HGB 9.2* 9.0* 7.9* 10.3* 9.3*  HCT 28.6* 27.9* 24.0* 31.0* 28.5*  MCV 97.3 97.2 94.5 93.4 93.4  PLT 91* 102* 102* 100* 88*    Basic Metabolic Panel: Recent Labs  Lab 11/15/22 0805 11/16/22 0421 11/17/22 0145 11/18/22  0359 11/19/22 0336  NA 135 138 137 135 135  K 3.9 4.1 4.2 4.4 4.6  CL 103 103 103 102 103  CO2 25 23 24 23 24   GLUCOSE 152* 122* 127* 126* 122*  BUN 65* 73* 99* 106* 116*  CREATININE 1.80* 1.86* 2.18* 2.05* 1.92*  CALCIUM 8.7* 8.7* 8.2* 8.2* 8.2*  MG  --  2.0 2.2 2.3  --     CBG: Recent Labs  Lab 11/18/22 1630  GLUCAP 181*    Recent Results (from the past 240 hour(s))  Resp panel by RT-PCR (RSV, Flu A&B, Covid) Anterior Nasal Swab     Status: None   Collection Time: 11/15/22  8:12 AM   Specimen: Anterior Nasal Swab  Result Value Ref Range  Status   SARS Coronavirus 2 by RT PCR NEGATIVE NEGATIVE Final    Comment: (NOTE) SARS-CoV-2 target nucleic acids are NOT DETECTED.  The SARS-CoV-2 RNA is generally detectable in upper respiratory specimens during the acute phase of infection. The lowest concentration of SARS-CoV-2 viral copies this assay can detect is 138 copies/mL. A negative result does not preclude SARS-Cov-2 infection and should not be used as the sole basis for treatment or other patient management decisions. A negative result may occur with  improper specimen collection/handling, submission of specimen other than nasopharyngeal swab, presence of viral mutation(s) within the areas targeted by this assay, and inadequate number of viral copies(<138 copies/mL). A negative result must be combined with clinical observations, patient history, and epidemiological information. The expected result is Negative.  Fact Sheet for Patients:  BloggerCourse.com  Fact Sheet for Healthcare Providers:  SeriousBroker.it  This test is no t yet approved or cleared by the [place] FDA and  has been authorized for detection and/or diagnosis of SARS-CoV-2 by FDA under an Emergency Use Authorization (EUA). This EUA will remain  in effect (meaning this test can be used) for the duration of the COVID-19 declaration under Section 564(b)(1) of the Act, 21 U.S.C.section 360bbb-3(b)(1), unless the authorization is terminated  or revoked sooner.       Influenza A by PCR NEGATIVE NEGATIVE Final   Influenza B by PCR NEGATIVE NEGATIVE Final    Comment: (NOTE) The Xpert Xpress SARS-CoV-2/FLU/RSV plus assay is intended as an aid in the diagnosis of influenza from Nasopharyngeal swab specimens and should not be used as a sole basis for treatment. Nasal washings and aspirates are unacceptable for Xpert Xpress SARS-CoV-2/FLU/RSV testing.  Fact Sheet for  Patients: BloggerCourse.com  Fact Sheet for Healthcare Providers: SeriousBroker.it  This test is not yet approved or cleared by the [place] FDA and has been authorized for detection and/or diagnosis of SARS-CoV-2 by FDA under an Emergency Use Authorization (EUA). This EUA will remain in effect (meaning this test can be used) for the duration of the COVID-19 declaration under Section 564(b)(1) of the Act, 21 U.S.C. section 360bbb-3(b)(1), unless the authorization is terminated or revoked.     Resp Syncytial Virus by PCR NEGATIVE NEGATIVE Final    Comment: (NOTE) Fact Sheet for Patients: BloggerCourse.com  Fact Sheet for Healthcare Providers: SeriousBroker.it  This test is not yet approved or cleared by the [place] FDA and has been authorized for detection and/or diagnosis of SARS-CoV-2 by FDA under an Emergency Use Authorization (EUA). This EUA will remain in effect (meaning this test can be used) for the duration of the COVID-19 declaration under Section 564(b)(1) of the Act, 21 U.S.C. section 360bbb-3(b)(1), unless the authorization is terminated or revoked.  Performed at [facility], [address], [place], [place] [address]   MRSA Next Gen by PCR, Nasal     Status: None   Collection Time: 11/15/22  5:57 PM   Specimen: Nasal Mucosa; Nasal Swab  Result Value Ref Range Status   MRSA by PCR Next Gen NOT DETECTED NOT DETECTED Final    Comment: (NOTE) The GeneXpert MRSA Assay (FDA approved for NASAL specimens only), is one component of a comprehensive MRSA colonization surveillance program. It is not intended to diagnose MRSA infection nor to guide or monitor treatment for MRSA infections. Test performance is not FDA approved in patients less than 25 years old. Performed at [facility], [address], [place], [place] [address]      Radiology Studies: DG CHEST  PORT 1 VIEW  Result Date: 11/18/2022 CLINICAL DATA:  Shortness of breath EXAM: PORTABLE CHEST 1 VIEW COMPARISON:  11/15/2022 FINDINGS: Irregular 3.8 cm in long axis left apical mass with associated fiducials. Atherosclerotic calcification of the aortic arch. Heart size within normal limits. The lungs appear otherwise clear. No blunting of the costophrenic angles. No significant bony abnormality. IMPRESSION: 1. 3.8 cm irregular left apical mass with associated fiducials. 2. Atherosclerotic calcification of the aortic arch. Aortic Atherosclerosis (ICD10-I70.0). 3. No acute findings. Electronically Signed   By: Gaylyn Rong M.D.   On: 11/18/2022 13:50    Scheduled Meds:  aspirin EC  81 mg Oral q AM   carvedilol  12.5 mg Oral BID WC   Chlorhexidine Gluconate Cloth  6 each Topical Daily   clopidogrel  300 mg Oral Once   [START ON 11/20/2022] clopidogrel  75 mg Oral Daily   dextromethorphan-guaiFENesin  1 tablet Oral BID   feeding supplement  237 mL Oral BID BM   hydrALAZINE  25 mg Oral Q8H   ipratropium-albuterol  3 mL Nebulization TID   isosorbide mononitrate  60 mg Oral Daily   multivitamin with minerals  1 tablet Oral Daily   predniSONE  20 mg Oral Q breakfast   Followed by   Melene Muller ON 11/21/2022] predniSONE  10 mg Oral Q breakfast   rosuvastatin  20 mg Oral q1800   Continuous Infusions:  azithromycin 500 mg (11/18/22 1515)   cefTRIAXone (ROCEPHIN)  IV Stopped (11/18/22 1413)   heparin 650 Units/hr (11/18/22 1644)     LOS: 4 days   Authorized and Performed by: Maryln Manuel MD  Total  time: 36 mins   Shaun Zuccaro Laural Benes, MD How to contact the [facility] Attending or Consulting provider 7A - 7P or covering provider during after hours 7P -7A, for this patient?  Check the care team in [facility] and look for a) attending/consulting [facility] provider listed and b) the [facility] team listed Log into [email or web address] and use [place]'s universal password to access. If you do not have the password, please contact the  hospital operator. Locate the [facility] provider you are looking for under [facility] and page to a number that you can be directly reached. If you still have difficulty reaching the provider, please page the [facility] (Director on Call) for the Hospitalists listed on amion for assistance.  11/19/2022, 10:55 AM

## 2022-11-20 DIAGNOSIS — Z515 Encounter for palliative care: Secondary | ICD-10-CM | POA: Diagnosis not present

## 2022-11-20 DIAGNOSIS — J9601 Acute respiratory failure with hypoxia: Secondary | ICD-10-CM | POA: Diagnosis not present

## 2022-11-20 DIAGNOSIS — Z7189 Other specified counseling: Secondary | ICD-10-CM | POA: Diagnosis not present

## 2022-11-20 DIAGNOSIS — I160 Hypertensive urgency: Secondary | ICD-10-CM | POA: Diagnosis not present

## 2022-11-20 DIAGNOSIS — I5033 Acute on chronic diastolic (congestive) heart failure: Secondary | ICD-10-CM

## 2022-11-20 DIAGNOSIS — R627 Adult failure to thrive: Secondary | ICD-10-CM | POA: Diagnosis not present

## 2022-11-20 LAB — CBC
HCT: 27.8 % — ABNORMAL LOW (ref 36.0–46.0)
Hemoglobin: 9 g/dL — ABNORMAL LOW (ref 12.0–15.0)
MCH: 30.7 pg (ref 26.0–34.0)
MCHC: 32.4 g/dL (ref 30.0–36.0)
MCV: 94.9 fL (ref 80.0–100.0)
Platelets: 95 10*3/uL — ABNORMAL LOW (ref 150–400)
RBC: 2.93 MIL/uL — ABNORMAL LOW (ref 3.87–5.11)
RDW: 13.2 % (ref 11.5–15.5)
WBC: 3.8 10*3/uL — ABNORMAL LOW (ref 4.0–10.5)
nRBC: 0 % (ref 0.0–0.2)

## 2022-11-20 LAB — BASIC METABOLIC PANEL
Anion gap: 7 (ref 5–15)
BUN: 115 mg/dL — ABNORMAL HIGH (ref 8–23)
CO2: 24 mmol/L (ref 22–32)
Calcium: 8.3 mg/dL — ABNORMAL LOW (ref 8.9–10.3)
Chloride: 103 mmol/L (ref 98–111)
Creatinine, Ser: 1.95 mg/dL — ABNORMAL HIGH (ref 0.44–1.00)
GFR, Estimated: 25 mL/min — ABNORMAL LOW (ref >60)
Glucose, Bld: 107 mg/dL — ABNORMAL HIGH (ref 70–99)
Potassium: 4.3 mmol/L (ref 3.5–5.1)
Sodium: 134 mmol/L — ABNORMAL LOW (ref 135–145)

## 2022-11-20 MED ORDER — CARVEDILOL 3.125 MG PO TABS
6.2500 mg | ORAL_TABLET | Freq: Two times a day (BID) | ORAL | Status: DC
  Administered 2022-11-20 – 2022-11-21 (×2): 6.25 mg via ORAL
  Filled 2022-11-20 (×2): qty 2

## 2022-11-20 NOTE — Progress Notes (Signed)
Pt arrived to room 312 via WC from ICU. Pt ambulatory with one assist from [facility] to bed. Pt c/o SOB with exertion, SaO2 99%, resp 20/min. A&O, denies c/o pain. Oriented to room and safety precautions, bed alarm on and call bell within reach. Fall mats in place. Pt advised to call for assistance prior to attempting to get OOB due to unsteady on feet and dyspnea with exertion.

## 2022-11-20 NOTE — Progress Notes (Signed)
Palliative: [name] is resting quietly in bed.  She appears acutely/chronically ill and somewhat frail.  She greets me, making and mostly keeping eye contact.  She is alert and oriented, able to make her needs known.  There is no family at bedside at this time.  We talk about her acute health concerns.  Overall, [name] tells me that she is improved and feels that she is strong enough to return to her own home with home health services and her family's support.  We discussed the benefits of outpatient palliative services.  At this point [name] states that she would like time to consider.  Conference with attending, cardiology, bedside nursing staff, transition of care team related to patient condition, needs, goals of care, disposition.  Plan: Continue to treat the treatable but no CPR or intubation.  Home with home health.  25 minutes     Lillia Carmel, NP Palliative medicine team Team phone [phone] Greater than 50% of this time was spent counseling and coordinating care related to the above assessment and plan.

## 2022-11-20 NOTE — Progress Notes (Signed)
Rounding Note    Patient Name: Lisa [name] Date of Encounter: 11/20/2022  [facility] Cardiologist: Little Ishikawa, MD   Subjective   No complaints  Inpatient Medications    Scheduled Meds:  aspirin EC  81 mg Oral q AM   carvedilol  12.5 mg Oral BID WC   Chlorhexidine Gluconate Cloth  6 each Topical Daily   clopidogrel  75 mg Oral Daily   dextromethorphan-guaiFENesin  1 tablet Oral BID   feeding supplement  237 mL Oral BID BM   heparin injection (subcutaneous)  5,000 Units Subcutaneous Q8H   hydrALAZINE  25 mg Oral Q8H   ipratropium-albuterol  3 mL Nebulization TID   isosorbide mononitrate  60 mg Oral Daily   multivitamin with minerals  1 tablet Oral Daily   [START ON 11/21/2022] predniSONE  10 mg Oral Q breakfast   rosuvastatin  20 mg Oral q1800   Continuous Infusions:  cefTRIAXone (ROCEPHIN)  IV 200 mL/hr at 11/19/22 1506   PRN Meds: albuterol, hydrALAZINE, ondansetron **OR** ondansetron (ZOFRAN) IV   Vital Signs    Vitals:   11/20/22 0430 11/20/22 0700 11/20/22 0726 11/20/22 0746  BP:  (!) 97/58    Pulse:      Resp:  15    Temp: 97.9 F (36.6 C)  97.9 F (36.6 C)   TempSrc: Oral  Oral   SpO2:  100%  100%  Weight: 46.8 kg       Intake/Output Summary (Last 24 hours) at 11/20/2022 0802 Last data filed at 11/20/2022 0430 Gross per 24 hour  Intake 202 ml  Output 400 ml  Net -198 ml      11/20/2022    4:30 AM 11/19/2022    5:13 AM 11/18/2022    4:05 AM  Last 3 Weights  Weight (lbs) 103 lb 2.8 oz 102 lb 8.2 oz 105 lb 2.6 oz  Weight (kg) 46.8 kg 46.5 kg 47.7 kg      Telemetry    Sinus brady and SR - Personally Reviewed  ECG    N/a - Personally Reviewed  Physical Exam   GEN: No acute distress.   Neck: No JVD Cardiac: RRR, no murmurs, rubs, or gallops.  Respiratory: Clear to auscultation bilaterally. GI: Soft, nontender, non-distended  MS: No edema; No deformity. Neuro:  Nonfocal  Psych: Normal affect   Labs    High  Sensitivity Troponin:   Recent Labs  Lab 11/15/22 2213 11/16/22 0003 11/16/22 0939  TROPONINIHS 692* 504* 12,379*     Chemistry Recent Labs  Lab 11/15/22 0805 11/16/22 0421 11/17/22 0145 11/18/22 0359 11/19/22 0336 11/20/22 0524  NA 135 138 137 135 135 134*  K 3.9 4.1 4.2 4.4 4.6 4.3  CL 103 103 103 102 103 103  CO2 25 23 24 23 24 24   GLUCOSE 152* 122* 127* 126* 122* 107*  BUN 65* 73* 99* 106* 116* 115*  CREATININE 1.80* 1.86* 2.18* 2.05* 1.92* 1.95*  CALCIUM 8.7* 8.7* 8.2* 8.2* 8.2* 8.3*  MG  --  2.0 2.2 2.3  --   --   PROT 9.7*  --   --   --   --   --   ALBUMIN 3.1*  --   --   --   --   --   AST 35  --   --   --   --   --   ALT 23  --   --   --   --   --  ALKPHOS 59  --   --   --   --   --   BILITOT 0.4  --   --   --   --   --   GFRNONAA 27* 26* 22* 23* 25* 25*  ANIONGAP 7 12 10 10 8 7     Lipids  Recent Labs  Lab 11/17/22 0145  CHOL 127  TRIG 77  HDL 43  LDLCALC 69  CHOLHDL 3.0    Hematology Recent Labs  Lab 11/18/22 0359 11/19/22 0336 11/20/22 0524  WBC 6.2 4.8 3.8*  RBC 3.32* 3.05* 2.93*  HGB 10.3* 9.3* 9.0*  HCT 31.0* 28.5* 27.8*  MCV 93.4 93.4 94.9  MCH 31.0 30.5 30.7  MCHC 33.2 32.6 32.4  RDW 14.0 13.6 13.2  PLT 100* 88* 95*   Thyroid No results for input(s): "TSH", "FREET4" in the last 168 hours.  BNP Recent Labs  Lab 11/15/22 0805 11/16/22 0421  BNP 612.0* 1,659.0*    DDimer No results for input(s): "DDIMER" in the last 168 hours.   Radiology    DG CHEST PORT 1 VIEW  Result Date: 11/18/2022 CLINICAL DATA:  Shortness of breath EXAM: PORTABLE CHEST 1 VIEW COMPARISON:  11/15/2022 FINDINGS: Irregular 3.8 cm in long axis left apical mass with associated fiducials. Atherosclerotic calcification of the aortic arch. Heart size within normal limits. The lungs appear otherwise clear. No blunting of the costophrenic angles. No significant bony abnormality. IMPRESSION: 1. 3.8 cm irregular left apical mass with associated fiducials. 2.  Atherosclerotic calcification of the aortic arch. Aortic Atherosclerosis (ICD10-I70.0). 3. No acute findings. Electronically Signed   By: Gaylyn Rong M.D.   On: 11/18/2022 13:50    Cardiac Studies     Patient Profile     88 y.o. female w/PMH of elevated troponin values (occurring during admission in 08/2021 following bronchoscopy and medical management pursued given Stage 4 CKD), SVT, HTN, Stage 4 CKD, COPD, adenocarcinoma of left lung (s/p radiation) and MGUS who is currently admitted for acute hypoxic respiratory failure and NSTEMI.   Assessment & Plan    1.Acute HFpEF - 11/2022 echo: LVEF 55-60%, grade II dd, normal RV, severe pulm HTN PASP 80 - BNP 1659  - incomplete I/Os. Admit weight 103, weight today 103. Bump in Cr with prior lasix, off IV lasix. Presented severely hypertensive SBP 230s -check reds vest. Appears euvolemic on exam.  - GFR 25, avoid SGLT2i  2.NSTEMI - EKG LVH, chronic strain pattern - trop up to 12,379 without clear peak. -  11/2022 echo: LVEF 55-60%, grade II dd. RWMA with akinesis in the basal inferior wall and basal inferoseptal wall are new.  - patient turned down cath. With advanced age, CKD, anemia, BMI 16 would in general be higher risk.   - plan for medical therapy with heparin (completed), ASA 81, coreg 12.5mg  bid, plavix 75mg , crestor 20mg . No ACE/ARB given renal dysfunction   3.COPD exacerbation - per primary team  4. CKD IV - AKI on CKD following diuresis, Cr trending back down.   5. Pulmonary HTN - likely secondary to HFpEF, COPD. She is has turned down invasive procedures, no plans for RHC   6. HTN - presented severe HTN SBP in 230s - bp's currently on the soft side SBPs low 100s though labile - continue to monitor - with low bp's and low HRs coreg held this AM  7.DNR   Low HRs and bp's, would stop hydralazine and lower coreg to 6.25mg  bid. Monitor HRs and bp's today.  For questions or updates, please contact [facility] Please consult [email or web address] for contact info under        Signed, Dina Rich, MD  11/20/2022, 8:02 AM

## 2022-11-20 NOTE — Progress Notes (Signed)
PROGRESS NOTE   Jaylanie Laird  ZOX:[id number] DOB: [birth year] DOA: 11/15/2022 PCP: [name], MD   Chief Complaint  Patient presents with   Shortness of Breath   Level of care: Telemetry  Brief Admission History:  88 year old female with stage IIIb CKD, chronic hepatitis C, former heavy tobacco user, hypertension, COPD, malignant neoplasm of left lung, treated with radiation, smoldering myeloma, goiter, monoclonal gammopathy of unknown significance, chronic dyspnea, coronary artery disease, anemia and CKD, diastolic heart failure with failure to thrive presented to the emergency department by EMS with acute onset of severe shortness of breath.  She has had progressive worsening shortness of breath since yesterday.  She started showing more signs of respiratory stridor and grunting and EMS noted pulse ox 85% on room air.  She complained of chest tightness and chest pain.  She is not normally on supplemental oxygen but was placed on nasal cannula en route.  Patient was severely hypertensive on arrival with systolic blood pressure of 238.  She was given albuterol bronchodilator treatment and IV steroids.  Her chest x-ray and CT chest showed chronic findings.  Unfortunately her respiratory distress progressed and she required continuous BiPAP therapy.  She was not able to have IV contrast for CTA chest and VQ scan not completed.  She is being admitted for acute respiratory distress presumably secondary to his COPD exacerbation.  After discussion with her healthcare surrogate decision made to make patient DNR but continue BiPAP treatments and stepdown ICU care.  Influenza and COVID testing negative.   Assessment and Plan:  Acute respiratory failure with hypoxia -multifactorial causes including acute heart failure and acute MI  -Patient presented with acute severe respiratory distress requiring continuous BiPAP therapy -Goals of care discussion with healthcare surrogate patient made  DNR -Continued IV steroids thru 6/8, then changed to oral prednisone, bronchodilators per RT -weaned off supplemental oxygen -intermittent dyspnea spells with anxiety and panic attacks -repeated portable CXR 6/8 was reassuring     Acute Encephalopathy - resolved  -secondary to hypercarbia related to acute respiratory failure -mentation improved after bipap treatment -ABG reassuring   Anemia of chronic disease -Hg down to 7.8, goal is >8 -transfused 1 unit PRBC 6/7 -rechecked CBC has been stable   NSTEMI -appreciate cardiology consultation and recommendations -treating medically per ACS protocol per Dr. Jenene Slicker -goals of care discussions with palliative medicine team and cardiology    -pt decided no further invasive testing desired and she does not want cath or other interventions -plan is to treat MI medically -complete 72 hours of IV heparin on 6/9  -continue aspirin 81 mg daily, add plavix load 300 mg followed by 75 mg daily per cardiologist  -recheck CBC in AM, if Hg stable, anticipate DC home 6/10 with family providing 24/7 supervision   COPD with acute exacerbation - TREATED -continue IV methylprednisolone but weaning dose as respiratory distress improves -continue IV antibiotics to complete 5 days -continue scheduled bronchodilators, wean as able  -weaning steroids now    Severe protein calorie malnutrition -pt is severely emaciated -will request dietitian consultation   Adult Failure to thrive  -IPAL meeting for goals of care discussion with brother - healthcare decision maker -after discussions, decision made by brother to make DNR  -palliative medicine consultation was requested  -ongoing goals of care discussions recommended -she would benefit from outpatient palliative care if family were agreeable    Stage IV CKD  -monitor Cr closely as it has been labile -renally dose medication  -  bump in creatinine from diuresis, temporary diuretic holiday, creatinine  coming down -hopefully can restart diuretic soon   Acute HFpEF -likely acute exacerbation of diastolic heart failure in setting of severe respiratory distress and acute MI -IV lasix treatment by cardiology team, currently on hold for diuretic holiday due to bump in creatinine -hopefully can restart diuretics on 6/10 -redsvest reading is 30 today which is reassuring   Intake/Output Summary (Last 24 hours) at 11/20/2022 1406 Last data filed at 11/20/2022 1100 Gross per 24 hour  Intake 442 ml  Output 600 ml  Net -158 ml   Filed Weights   11/18/22 0405 11/19/22 0513 11/20/22 0430  Weight: 47.7 kg 46.5 kg 46.8 kg    Hypertensive Urgency / Uncontrolled HTN - improving  -presented with severely elevated BPs likely related to acute respiratory failure -Pt presented with severe respiratory distress which is now RESOLVED -cardiology added carvedilol 6.25 mg BID and imdur to 60 mg  -BP now has been trending down and we are de-escalating her regimen   Adenocarcinoma / Mass of Left upper lung -s/p radiation treatments by Dr. Roselind Messier 5/1-10/17/2021 -CT and CXR show stable findings likely residual scarring -Follow up with Dr. Ellin Saba for ongoing surveillance as scheduled   MGUS / smoldering multiple myeloma - she is being followed for this by Dr. Ellin Saba    DVT prophylaxis: IV heparin infusion x 72 hours, followed by subcutaneous heparin  Code Status: DNR  Family Communication: brother  Disposition: anticipate DC home with [facility]   Remains inpatient appropriate because: intensity    Consultants:  Cardiology Palliative medicine  Procedures:   Antimicrobials:    Subjective: Pt reports that she has been feeling stronger and no CP and no SOB symptoms today.   Objective: Vitals:   11/20/22 0930 11/20/22 1000 11/20/22 1032 11/20/22 1321  BP: 117/75 126/77 (!) 173/67   Pulse:   62 66  Resp: 18 17 20 16   Temp:   97.7 F (36.5 C)   TempSrc:   Oral   SpO2: 97%  99% 99%  Weight:         Intake/Output Summary (Last 24 hours) at 11/20/2022 1406 Last data filed at 11/20/2022 1100 Gross per 24 hour  Intake 442 ml  Output 600 ml  Net -158 ml   Filed Weights   11/18/22 0405 11/19/22 0513 11/20/22 0430  Weight: 47.7 kg 46.5 kg 46.8 kg   Examination:  General exam: Appears chronically ill, on room air oxygen, frail, cachectic, good  mentation, alert, cooperative, calm and comfortable  Respiratory system: BBS clear to auscultation.  Cardiovascular system: normal S1 & S2 heard. No JVD, murmurs, rubs, gallops or clicks. No pedal edema. Gastrointestinal system: Abdomen is nondistended, soft and nontender. No organomegaly or masses felt. Normal bowel sounds heard. Central nervous system: Alert and oriented. No focal neurological deficits. Extremities: Symmetric 5 x 5 power. Skin: No rashes, lesions or ulcers. Psychiatry: Judgement and insight appear normal. Mood & affect appropriate.   Data Reviewed: I have personally reviewed following labs and imaging studies  CBC: Recent Labs  Lab 11/15/22 0805 11/16/22 0421 11/17/22 0145 11/18/22 0359 11/19/22 0336 11/20/22 0524  WBC 4.9 3.4* 5.7 6.2 4.8 3.8*  NEUTROABS 3.3 2.9 5.0 5.4  --   --   HGB 9.2* 9.0* 7.9* 10.3* 9.3* 9.0*  HCT 28.6* 27.9* 24.0* 31.0* 28.5* 27.8*  MCV 97.3 97.2 94.5 93.4 93.4 94.9  PLT 91* 102* 102* 100* 88* 95*    Basic Metabolic Panel: Recent Labs  Lab 11/16/22 0421 11/17/22 0145 11/18/22 0359 11/19/22 0336 11/20/22 0524  NA 138 137 135 135 134*  K 4.1 4.2 4.4 4.6 4.3  CL 103 103 102 103 103  CO2 23 24 23 24 24   GLUCOSE 122* 127* 126* 122* 107*  BUN 73* 99* 106* 116* 115*  CREATININE 1.86* 2.18* 2.05* 1.92* 1.95*  CALCIUM 8.7* 8.2* 8.2* 8.2* 8.3*  MG 2.0 2.2 2.3  --   --     CBG: Recent Labs  Lab 11/18/22 1630  GLUCAP 181*    Recent Results (from the past 240 hour(s))  Resp panel by RT-PCR (RSV, Flu A&B, Covid) Anterior Nasal Swab     Status: None   Collection Time: 11/15/22   8:12 AM   Specimen: Anterior Nasal Swab  Result Value Ref Range Status   SARS Coronavirus 2 by RT PCR NEGATIVE NEGATIVE Final    Comment: (NOTE) SARS-CoV-2 target nucleic acids are NOT DETECTED.  The SARS-CoV-2 RNA is generally detectable in upper respiratory specimens during the acute phase of infection. The lowest concentration of SARS-CoV-2 viral copies this assay can detect is 138 copies/mL. A negative result does not preclude SARS-Cov-2 infection and should not be used as the sole basis for treatment or other patient management decisions. A negative result may occur with  improper specimen collection/handling, submission of specimen other than nasopharyngeal swab, presence of viral mutation(s) within the areas targeted by this assay, and inadequate number of viral copies(<138 copies/mL). A negative result must be combined with clinical observations, patient history, and epidemiological information. The expected result is Negative.  Fact Sheet for Patients:  BloggerCourse.com  Fact Sheet for Healthcare Providers:  SeriousBroker.it  This test is no t yet approved or cleared by the [place] FDA and  has been authorized for detection and/or diagnosis of SARS-CoV-2 by FDA under an Emergency Use Authorization (EUA). This EUA will remain  in effect (meaning this test can be used) for the duration of the COVID-19 declaration under Section 564(b)(1) of the Act, 21 U.S.C.section 360bbb-3(b)(1), unless the authorization is terminated  or revoked sooner.       Influenza A by PCR NEGATIVE NEGATIVE Final   Influenza B by PCR NEGATIVE NEGATIVE Final    Comment: (NOTE) The Xpert Xpress SARS-CoV-2/FLU/RSV plus assay is intended as an aid in the diagnosis of influenza from Nasopharyngeal swab specimens and should not be used as a sole basis for treatment. Nasal washings and aspirates are unacceptable for Xpert Xpress  SARS-CoV-2/FLU/RSV testing.  Fact Sheet for Patients: BloggerCourse.com  Fact Sheet for Healthcare Providers: SeriousBroker.it  This test is not yet approved or cleared by the [place] FDA and has been authorized for detection and/or diagnosis of SARS-CoV-2 by FDA under an Emergency Use Authorization (EUA). This EUA will remain in effect (meaning this test can be used) for the duration of the COVID-19 declaration under Section 564(b)(1) of the Act, 21 U.S.C. section 360bbb-3(b)(1), unless the authorization is terminated or revoked.     Resp Syncytial Virus by PCR NEGATIVE NEGATIVE Final    Comment: (NOTE) Fact Sheet for Patients: BloggerCourse.com  Fact Sheet for Healthcare Providers: SeriousBroker.it  This test is not yet approved or cleared by the [place] FDA and has been authorized for detection and/or diagnosis of SARS-CoV-2 by FDA under an Emergency Use Authorization (EUA). This EUA will remain in effect (meaning this test can be used) for the duration of the COVID-19 declaration under Section 564(b)(1) of the Act, 21 U.S.C. section 360bbb-3(b)(1),  unless the authorization is terminated or revoked.  Performed at [facility], [address] [place], [place] [address]   MRSA Next Gen by PCR, Nasal     Status: None   Collection Time: 11/15/22  5:57 PM   Specimen: Nasal Mucosa; Nasal Swab  Result Value Ref Range Status   MRSA by PCR Next Gen NOT DETECTED NOT DETECTED Final    Comment: (NOTE) The GeneXpert MRSA Assay (FDA approved for NASAL specimens only), is one component of a comprehensive MRSA colonization surveillance program. It is not intended to diagnose MRSA infection nor to guide or monitor treatment for MRSA infections. Test performance is not FDA approved in patients less than 42 years old. Performed at [facility], [address], [place],  [place] [address]     Radiology Studies: No results found.  Scheduled Meds:  aspirin EC  81 mg Oral q AM   carvedilol  6.25 mg Oral BID WC   Chlorhexidine Gluconate Cloth  6 each Topical Daily   clopidogrel  75 mg Oral Daily   dextromethorphan-guaiFENesin  1 tablet Oral BID   feeding supplement  237 mL Oral BID BM   heparin injection (subcutaneous)  5,000 Units Subcutaneous Q8H   ipratropium-albuterol  3 mL Nebulization TID   isosorbide mononitrate  60 mg Oral Daily   multivitamin with minerals  1 tablet Oral Daily   [START ON 11/21/2022] predniSONE  10 mg Oral Q breakfast   rosuvastatin  20 mg Oral q1800   Continuous Infusions:  cefTRIAXone (ROCEPHIN)  IV 1 g (11/20/22 1359)     LOS: 5 days   Authorized and Performed by: Maryln Manuel MD  Total  time: 35 mins  Meaghan Whistler Laural Benes, MD How to contact the [facility] Attending or Consulting provider 7A - 7P or covering provider during after hours 7P -7A, for this patient?  Check the care team in [facility] and look for a) attending/consulting TR[facility] provider listed and b) the [facility] team listed Log into [email or web address] and use [place]'s universal password to access. If you do not have the password, please contact the hospital operator. Locate the [facility] provider you are looking for under [facility] and page to a number that you can be directly reached. If you still have difficulty reaching the provider, please page the [facility] (Director on Call) for the Hospitalists listed on amion for assistance.  11/20/2022, 2:06 PM

## 2022-11-20 NOTE — Progress Notes (Signed)
   11/20/22 1108  ReDS Vest / Clip  Station Marker C  Ruler Value 23  ReDS Value Range < 36  ReDS Actual Value 30

## 2022-11-21 ENCOUNTER — Telehealth: Payer: Self-pay | Admitting: Internal Medicine

## 2022-11-21 DIAGNOSIS — J441 Chronic obstructive pulmonary disease with (acute) exacerbation: Secondary | ICD-10-CM | POA: Diagnosis not present

## 2022-11-21 DIAGNOSIS — I160 Hypertensive urgency: Secondary | ICD-10-CM | POA: Diagnosis not present

## 2022-11-21 DIAGNOSIS — E43 Unspecified severe protein-calorie malnutrition: Secondary | ICD-10-CM | POA: Diagnosis not present

## 2022-11-21 DIAGNOSIS — R627 Adult failure to thrive: Secondary | ICD-10-CM | POA: Diagnosis not present

## 2022-11-21 DIAGNOSIS — Z7189 Other specified counseling: Secondary | ICD-10-CM | POA: Diagnosis not present

## 2022-11-21 DIAGNOSIS — Z515 Encounter for palliative care: Secondary | ICD-10-CM | POA: Diagnosis not present

## 2022-11-21 DIAGNOSIS — J9601 Acute respiratory failure with hypoxia: Secondary | ICD-10-CM | POA: Diagnosis not present

## 2022-11-21 LAB — RENAL FUNCTION PANEL
Albumin: 2.4 g/dL — ABNORMAL LOW (ref 3.5–5.0)
Anion gap: 6 (ref 5–15)
BUN: 118 mg/dL — ABNORMAL HIGH (ref 8–23)
CO2: 24 mmol/L (ref 22–32)
Calcium: 8.3 mg/dL — ABNORMAL LOW (ref 8.9–10.3)
Chloride: 105 mmol/L (ref 98–111)
Creatinine, Ser: 1.72 mg/dL — ABNORMAL HIGH (ref 0.44–1.00)
GFR, Estimated: 29 mL/min — ABNORMAL LOW (ref >60)
Glucose, Bld: 96 mg/dL (ref 70–99)
Phosphorus: 2.5 mg/dL (ref 2.5–4.6)
Potassium: 4.3 mmol/L (ref 3.5–5.1)
Sodium: 135 mmol/L (ref 135–145)

## 2022-11-21 LAB — CBC
HCT: 25.2 % — ABNORMAL LOW (ref 36.0–46.0)
Hemoglobin: 8.3 g/dL — ABNORMAL LOW (ref 12.0–15.0)
MCH: 30.4 pg (ref 26.0–34.0)
MCHC: 32.9 g/dL (ref 30.0–36.0)
MCV: 92.3 fL (ref 80.0–100.0)
Platelets: 99 10*3/uL — ABNORMAL LOW (ref 150–400)
RBC: 2.73 MIL/uL — ABNORMAL LOW (ref 3.87–5.11)
RDW: 12.9 % (ref 11.5–15.5)
WBC: 3.6 10*3/uL — ABNORMAL LOW (ref 4.0–10.5)
nRBC: 0 % (ref 0.0–0.2)

## 2022-11-21 MED ORDER — AMLODIPINE BESYLATE 5 MG PO TABS: 2.5000 mg | ORAL_TABLET | Freq: Every day | ORAL | Status: DC

## 2022-11-21 MED ORDER — AMLODIPINE BESYLATE 5 MG PO TABS
5.0000 mg | ORAL_TABLET | Freq: Every day | ORAL | Status: DC
  Administered 2022-11-21: 5 mg via ORAL
  Filled 2022-11-21: qty 1

## 2022-11-21 MED ORDER — AMLODIPINE BESYLATE 5 MG PO TABS: 5.0000 mg | ORAL_TABLET | Freq: Every day | ORAL | 1 refills | Status: DC

## 2022-11-21 MED ORDER — METOPROLOL TARTRATE 25 MG PO TABS: 25.0000 mg | ORAL_TABLET | Freq: Two times a day (BID) | ORAL | 2 refills | Status: DC

## 2022-11-21 MED ORDER — CLOPIDOGREL BISULFATE 75 MG PO TABS: 75.0000 mg | ORAL_TABLET | Freq: Every day | ORAL | 2 refills | Status: DC

## 2022-11-21 MED ORDER — ROSUVASTATIN CALCIUM 20 MG PO TABS: 20.0000 mg | ORAL_TABLET | Freq: Every day | ORAL | 2 refills | Status: DC

## 2022-11-21 MED ORDER — FUROSEMIDE 20 MG PO TABS: 20.0000 mg | ORAL_TABLET | Freq: Every day | ORAL | Status: DC | PRN

## 2022-11-21 MED ORDER — ADULT MULTIVITAMIN W/MINERALS CH: 1.0000 | ORAL_TABLET | Freq: Every day | ORAL | Status: DC

## 2022-11-21 MED ORDER — ISOSORBIDE MONONITRATE ER 60 MG PO TB24: 60.0000 mg | ORAL_TABLET | Freq: Every day | ORAL | 2 refills | Status: DC

## 2022-11-21 MED ORDER — PREDNISONE 10 MG PO TABS: 10.0000 mg | ORAL_TABLET | Freq: Every day | ORAL | 0 refills | Status: AC

## 2022-11-21 MED ORDER — METOPROLOL TARTRATE 25 MG PO TABS: 25.0000 mg | ORAL_TABLET | Freq: Two times a day (BID) | ORAL | Status: DC

## 2022-11-21 MED ORDER — IPRATROPIUM-ALBUTEROL 0.5-2.5 (3) MG/3ML IN SOLN: 3.0000 mL | Freq: Four times a day (QID) | RESPIRATORY_TRACT | Status: DC | PRN

## 2022-11-21 NOTE — Progress Notes (Signed)
Rounding Note    Patient Name: Lisa [name] Date of Encounter: 11/21/2022  [facility] Cardiologist: Little Ishikawa, MD  Subjective   No complaints  Inpatient Medications    Scheduled Meds:  aspirin EC  81 mg Oral q AM   carvedilol  6.25 mg Oral BID WC   clopidogrel  75 mg Oral Daily   dextromethorphan-guaiFENesin  1 tablet Oral BID   feeding supplement  237 mL Oral BID BM   heparin injection (subcutaneous)  5,000 Units Subcutaneous Q8H   ipratropium-albuterol  3 mL Nebulization TID   isosorbide mononitrate  60 mg Oral Daily   multivitamin with minerals  1 tablet Oral Daily   predniSONE  10 mg Oral Q breakfast   rosuvastatin  20 mg Oral q1800   Continuous Infusions:  PRN Meds: albuterol, hydrALAZINE, ondansetron **OR** ondansetron (ZOFRAN) IV   Vital Signs    Vitals:   11/20/22 2100 11/21/22 0404 11/21/22 0715 11/21/22 0924  BP: (!) 166/54 (!) 179/56  (!) 155/52  Pulse: 65 (!) 57  60  Resp: 18 18  19   Temp: 98.4 F (36.9 C) 98.2 F (36.8 C)  98.1 F (36.7 C)  TempSrc: Oral Oral  Oral  SpO2: 99% 100% 94% 99%  Weight:        Intake/Output Summary (Last 24 hours) at 11/21/2022 0956 Last data filed at 11/20/2022 1815 Gross per 24 hour  Intake 280 ml  Output 600 ml  Net -320 ml      11/20/2022    4:30 AM 11/19/2022    5:13 AM 11/18/2022    4:05 AM  Last 3 Weights  Weight (lbs) 103 lb 2.8 oz 102 lb 8.2 oz 105 lb 2.6 oz  Weight (kg) 46.8 kg 46.5 kg 47.7 kg      Telemetry    SR - Personally Reviewed  ECG    N/a - Personally Reviewed  Physical Exam   GEN: No acute distress.   Neck: No JVD Cardiac: RRR, no murmurs, rubs, or gallops.  Respiratory: mild bilateral wheezing GI: Soft, nontender, non-distended  MS: No edema; No deformity. Neuro:  Nonfocal  Psych: Normal affect   Labs    High Sensitivity Troponin:   Recent Labs  Lab 11/15/22 2213 11/16/22 0003 11/16/22 0939  TROPONINIHS 692* 504* 12,379*      Chemistry Recent Labs  Lab 11/15/22 0805 11/16/22 0421 11/17/22 0145 11/18/22 0359 11/19/22 0336 11/20/22 0524 11/21/22 0416  NA 135 138 137 135 135 134* 135  K 3.9 4.1 4.2 4.4 4.6 4.3 4.3  CL 103 103 103 102 103 103 105  CO2 25 23 24 23 24 24 24   GLUCOSE 152* 122* 127* 126* 122* 107* 96  BUN 65* 73* 99* 106* 116* 115* 118*  CREATININE 1.80* 1.86* 2.18* 2.05* 1.92* 1.95* 1.72*  CALCIUM 8.7* 8.7* 8.2* 8.2* 8.2* 8.3* 8.3*  MG  --  2.0 2.2 2.3  --   --   --   PROT 9.7*  --   --   --   --   --   --   ALBUMIN 3.1*  --   --   --   --   --  2.4*  AST 35  --   --   --   --   --   --   ALT 23  --   --   --   --   --   --   ALKPHOS 59  --   --   --   --   --   --  BILITOT 0.4  --   --   --   --   --   --   GFRNONAA 27* 26* 22* 23* 25* 25* 29*  ANIONGAP 7 12 10 10 8 7 6     Lipids  Recent Labs  Lab 11/17/22 0145  CHOL 127  TRIG 77  HDL 43  LDLCALC 69  CHOLHDL 3.0    Hematology Recent Labs  Lab 11/19/22 0336 11/20/22 0524 11/21/22 0416  WBC 4.8 3.8* 3.6*  RBC 3.05* 2.93* 2.73*  HGB 9.3* 9.0* 8.3*  HCT 28.5* 27.8* 25.2*  MCV 93.4 94.9 92.3  MCH 30.5 30.7 30.4  MCHC 32.6 32.4 32.9  RDW 13.6 13.2 12.9  PLT 88* 95* 99*   Thyroid No results for input(s): "TSH", "FREET4" in the last 168 hours.  BNP Recent Labs  Lab 11/15/22 0805 11/16/22 0421  BNP 612.0* 1,659.0*    DDimer No results for input(s): "DDIMER" in the last 168 hours.   Radiology    No results found.  Cardiac Studies     Patient Profile     88 y.o. female w/PMH of elevated troponin values (occurring during admission in 08/2021 following bronchoscopy and medical management pursued given Stage 4 CKD), SVT, HTN, Stage 4 CKD, COPD, adenocarcinoma of left lung (s/p radiation) and MGUS who is currently admitted for acute hypoxic respiratory failure and NSTEMI.   Assessment & Plan    1.Acute HFpEF - 11/2022 echo: LVEF 55-60%, grade II dd, normal RV, severe pulm HTN PASP 80 - BNP 1659   - incomplete  I/Os.  Bump in Cr with prior lasix, off IV lasix. - reds vest 6/10 was 30% suggesting euvolemic.  - GFR 25, avoid SGLT2i   2.NSTEMI - EKG LVH, chronic strain pattern - trop up to 12,379 without clear peak. -  11/2022 echo: LVEF 55-60%, grade II dd. RWMA with akinesis in the basal inferior wall and basal inferoseptal wall are new.  - patient turned down cath. With advanced age, CKD, anemia, BMI 16 would in general be higher risk.    - plan for medical therapy with heparin (completed), ASA 81, coreg 12.5mg  bid, plavix 75mg , crestor 20mg . No ACE/ARB given renal dysfunction - with wheezing change coreg to lopressor 25mg  bid.      3.COPD exacerbation - per primary team   4. CKD IV - AKI on CKD following diuresis, Cr trending back down.    5. Pulmonary HTN - likely secondary to HFpEF, COPD. She is has turned down invasive procedures, no plans for RHC     6. HTN - presented severe HTN SBP in 230s - bp's yesterday were soft, now high again. Perhaps transient low after diuresis.  -start norvasc 5 mg daily, slow titration of meds given labile bp. CHange coreg to lopressor given COPD and active wheezing.     7.DNR     Ok for discharge from [facility], we will arrange f/u.      For questions or updates, please contact [facility] Please consult [email or web address] for contact info under        Signed, Dina Rich, MD  11/21/2022, 9:56 AM

## 2022-11-21 NOTE — Discharge Summary (Signed)
Physician Discharge Summary  Lisa [name] ZOX:[id number] DOB: 1935-04-22 DOA: 11/15/2022  PCP: [name], MD Cardiology: [facility] Admit date: 11/15/2022 Discharge date: 11/21/2022  Admitted From:  Home Disposition: Home with 24/7 supervision from family  Recommendations for Outpatient Follow-up:  Follow up with PCP in 1 weeks Follow up with cardiology on 12/08/22 at 2:30 pm  Follow up with Dr. Ellin Saba as scheduled  Please consider outpatient palliative medicine consultation  Home Health: PT/OT  Discharge Condition: STABLE   CODE STATUS: DNR DIET: low sodium heart healthy    Brief Hospitalization Summary: Please see all hospital notes, images, labs for full details of the hospitalization. ADMISSION PROVIDER HPI:  88 year old female with stage IIIb CKD, chronic hepatitis C, former heavy tobacco user, hypertension, COPD, malignant neoplasm of left lung, treated with radiation, smoldering myeloma, goiter, monoclonal gammopathy of unknown significance, chronic dyspnea, coronary artery disease, anemia and CKD, diastolic heart failure with failure to thrive presented to the emergency department by EMS with acute onset of severe shortness of breath.  She has had progressive worsening shortness of breath since yesterday.  She started showing more signs of respiratory stridor and grunting and EMS noted pulse ox 85% on room air.  She complained of chest tightness and chest pain.  She is not normally on supplemental oxygen but was placed on nasal cannula en route.  Patient was severely hypertensive on arrival with systolic blood pressure of 238.  She was given albuterol bronchodilator treatment and IV steroids.  Her chest x-ray and CT chest showed chronic findings.  Unfortunately her respiratory distress progressed and she required continuous BiPAP therapy.  She was not able to have IV contrast for CTA chest and VQ scan not completed.  She is being admitted for acute respiratory distress  presumably secondary to his COPD exacerbation.  After discussion with her healthcare surrogate decision made to make patient DNR but continue BiPAP treatments and stepdown ICU care.  Influenza and COVID testing negative.  HOSPITAL COURSE BY PROBLEM   Acute respiratory failure with hypoxia -multifactorial causes including acute heart failure and acute MI  -Patient presented with acute severe respiratory distress requiring continuous BiPAP therapy -Goals of care discussion with healthcare surrogate patient made DNR -Continued IV steroids thru 6/8, then changed to oral prednisone, bronchodilators per RT -weaned off supplemental oxygen -intermittent dyspnea spells with anxiety and panic attacks -repeated portable CXR 6/8 was reassuring     Acute Encephalopathy - resolved  -secondary to hypercarbia related to acute respiratory failure -mentation improved after bipap treatment -ABG reassuring    Anemia of chronic disease -Hg down to 7.8, goal is >8 -transfused 1 unit PRBC 6/7 -rechecked CBC has been stable    NSTEMI -appreciate cardiology consultation and recommendations -treating medically per ACS protocol per Dr. Jenene Slicker -goals of care discussions with palliative medicine team and cardiology    -pt decided no further invasive testing desired and she does not want cath or other interventions -plan is to treat MI medically -completed 72 hours of IV heparin on 6/9  -continue aspirin 81 mg daily, add plavix load 300 mg followed by 75 mg daily per cardiologist  -recheck CBC in AM, if Hg stable, anticipate DC home 6/11 with family providing 24/7 supervision -pt discharging on metoprolol 25 mg BID, amlodipine 5 mg daily, imdur 60 mg daily, aspirin 81 mg, plavix 75 mg, crestor 20 mg   COPD with acute exacerbation - TREATED -continue IV methylprednisolone but weaning dose as respiratory distress improves -continue  IV antibiotics to complete 5 days -continue home bronchodilators  -weaning  steroids now to complete in 2 days   Severe protein calorie malnutrition -pt is severely emaciated -will request dietitian consultation   Adult Failure to thrive  -IPAL meeting for goals of care discussion with brother - healthcare decision maker -after discussions, decision made by brother to make DNR  -palliative medicine consultation was requested  -ongoing goals of care discussions recommended -she would benefit from outpatient palliative care if family were agreeable    Stage IV CKD  -monitor Cr closely as it has been labile -renally dose medication  -bump in creatinine from diuresis, temporary diuretic holiday, creatinine coming down -lasix now as needed for edema, puffiness or weight gain of 3# or more    Acute HFpEF -likely acute exacerbation of diastolic heart failure in setting of severe respiratory distress and acute MI -IV lasix treatment by cardiology team, currently on hold for diuretic holiday due to bump in creatinine -redsvest reading is 30 today which is reassuring  -lasix 20 mg daily prn weight gain, edema or puffiness, outpatient follow up with cardiology on 12/08/22    Intake/Output Summary (Last 24 hours) at 11/20/2022 1406 Last data filed at 11/20/2022 1100    Gross per 24 hour  Intake 442 ml  Output 600 ml  Net -158 ml         Filed Weights    11/18/22 0405 11/19/22 0513 11/20/22 0430  Weight: 47.7 kg 46.5 kg 46.8 kg    Hypertensive Urgency / Uncontrolled HTN - improved  -presented with severely elevated BPs likely related to acute respiratory failure -Pt presented with severe respiratory distress which is now RESOLVED -cardiology added metoprolol 25 mg BID and imdur to 60 mg and amlodipine 5 mg   Adenocarcinoma / Mass of Left upper lung -s/p radiation treatments by Dr. Roselind Messier 5/1-10/17/2021 -CT and CXR show stable findings likely residual scarring -Follow up with Dr. Ellin Saba for ongoing surveillance as scheduled   MGUS / smoldering multiple  myeloma - she is being followed for this by Dr. Ellin Saba     Discharge Diagnoses:  Principal Problem:   Acute hypoxic respiratory failure (HCC) Active Problems:   Hypertensive urgency   COPD with acute exacerbation (HCC)   Hypertension   COPD (chronic obstructive pulmonary disease) (HCC)   H/O goiter   AKI (acute kidney injury) (HCC)   Encephalopathy   Protein-calorie malnutrition, severe (HCC)   Smoldering multiple myeloma   Anemia due to stage 4 chronic kidney disease (HCC)   Chronic hepatitis C (HCC)   CKD (chronic kidney disease) stage 4, GFR 15-29 ml/min (HCC)   Mass of upper lobe of left lung   Adenocarcinoma of left lung (HCC)   Malignant neoplasm of upper lobe of left lung (HCC)   Elevated brain natriuretic peptide (BNP) level   FTT (failure to thrive) in adult   Underweight   Acute respiratory distress   Acute on chronic heart failure with preserved ejection fraction (HCC)   Acute congestive heart failure [facility])   Discharge Instructions:  Allergies as of 11/21/2022   No Known Allergies      Medication List     STOP taking these medications    carvedilol 25 MG tablet Commonly known as: COREG   telmisartan 20 MG tablet Commonly known as: MICARDIS       TAKE these medications    amLODipine 5 MG tablet Commonly known as: NORVASC Take 1 tablet (5 mg total) by mouth daily.  What changed:  medication strength how much to take   aspirin EC 81 MG tablet Take 81 mg by mouth in the morning.   calcium carbonate 1250 (500 Ca) MG tablet Commonly known as: OS-CAL - dosed in mg of elemental calcium Take 1 tablet by mouth daily with breakfast.   clopidogrel 75 MG tablet Commonly known as: PLAVIX Take 1 tablet (75 mg total) by mouth daily. Start taking on: November 22, 2022   feeding supplement Liqd Take 237 mLs by mouth 2 (two) times daily between meals.   furosemide 20 MG tablet Commonly known as: LASIX Take 1 tablet (20 mg total) by mouth daily as  needed for edema (or weight gain 3 pounds or more). What changed:  when to take this reasons to take this additional instructions   ipratropium-albuterol 0.5-2.5 (3) MG/3ML Soln Commonly known as: DUONEB Take 3 mLs by nebulization every 4 (four) hours as needed (wheezing/shortness of breath).   Combivent Respimat 20-100 MCG/ACT Aers respimat Generic drug: Ipratropium-Albuterol Inhale 1 puff into the lungs every 6 (six) hours as needed for wheezing or shortness of breath.   Iron (Ferrous Sulfate) 325 (65 Fe) MG Tabs Take by mouth.   isosorbide mononitrate 60 MG 24 hr tablet Commonly known as: IMDUR Take 1 tablet (60 mg total) by mouth daily. What changed:  medication strength how much to take   metoprolol tartrate 25 MG tablet Commonly known as: LOPRESSOR Take 1 tablet (25 mg total) by mouth 2 (two) times daily.   multivitamin with minerals Tabs tablet Take 1 tablet by mouth daily. Start taking on: November 22, 2022   nitroGLYCERIN 0.4 MG SL tablet Commonly known as: NITROSTAT Place 1 tablet (0.4 mg total) under the tongue every 5 (five) minutes as needed for chest pain.   ondansetron 4 MG tablet Commonly known as: ZOFRAN Take 1 tablet (4 mg total) by mouth every 6 (six) hours as needed for nausea.   predniSONE 10 MG tablet Commonly known as: DELTASONE Take 1 tablet (10 mg total) by mouth daily with breakfast for 2 days. Start taking on: November 22, 2022   rosuvastatin 20 MG tablet Commonly known as: CRESTOR Take 1 tablet (20 mg total) by mouth daily at 6 PM.   Vitamin D3 125 MCG (5000 UT) Caps Take 5,000 Units by mouth daily.        Follow-up Information     Ellsworth Lennox, PA-C Follow up on 12/08/2022.   Specialties: Cardiology, Cardiology Why: Cardiology Hospital Follow-up on 12/08/2022 at 2:30 PM. Arrive by 2:15 PM for check-in. Will be at the [facility] office (attached to [facility]). Contact information: [address] [place]  [address] [phone]         [name], MD. Schedule an appointment as soon as possible for a visit in 1 week(s).   Specialty: Internal Medicine Why: Hospital Follow Up Contact information: [address] [place] [address] [phone]                No Known Allergies Allergies as of 11/21/2022   No Known Allergies      Medication List     STOP taking these medications    carvedilol 25 MG tablet Commonly known as: COREG   telmisartan 20 MG tablet Commonly known as: MICARDIS       TAKE these medications    amLODipine 5 MG tablet Commonly known as: NORVASC Take 1 tablet (5 mg total) by mouth daily.  What changed:  medication strength how much to take   aspirin EC 81 MG tablet Take 81 mg by mouth in the morning.   calcium carbonate 1250 (500 Ca) MG tablet Commonly known as: OS-CAL - dosed in mg of elemental calcium Take 1 tablet by mouth daily with breakfast.   clopidogrel 75 MG tablet Commonly known as: PLAVIX Take 1 tablet (75 mg total) by mouth daily. Start taking on: November 22, 2022   feeding supplement Liqd Take 237 mLs by mouth 2 (two) times daily between meals.   furosemide 20 MG tablet Commonly known as: LASIX Take 1 tablet (20 mg total) by mouth daily as needed for edema (or weight gain 3 pounds or more). What changed:  when to take this reasons to take this additional instructions   ipratropium-albuterol 0.5-2.5 (3) MG/3ML Soln Commonly known as: DUONEB Take 3 mLs by nebulization every 4 (four) hours as needed (wheezing/shortness of breath).   Combivent Respimat 20-100 MCG/ACT Aers respimat Generic drug: Ipratropium-Albuterol Inhale 1 puff into the lungs every 6 (six) hours as needed for wheezing or shortness of breath.   Iron (Ferrous Sulfate) 325 (65 Fe) MG Tabs Take by mouth.   isosorbide mononitrate 60 MG 24 hr tablet Commonly known as: IMDUR Take 1 tablet (60 mg total) by mouth daily. What changed:   medication strength how much to take   metoprolol tartrate 25 MG tablet Commonly known as: LOPRESSOR Take 1 tablet (25 mg total) by mouth 2 (two) times daily.   multivitamin with minerals Tabs tablet Take 1 tablet by mouth daily. Start taking on: November 22, 2022   nitroGLYCERIN 0.4 MG SL tablet Commonly known as: NITROSTAT Place 1 tablet (0.4 mg total) under the tongue every 5 (five) minutes as needed for chest pain.   ondansetron 4 MG tablet Commonly known as: ZOFRAN Take 1 tablet (4 mg total) by mouth every 6 (six) hours as needed for nausea.   predniSONE 10 MG tablet Commonly known as: DELTASONE Take 1 tablet (10 mg total) by mouth daily with breakfast for 2 days. Start taking on: November 22, 2022   rosuvastatin 20 MG tablet Commonly known as: CRESTOR Take 1 tablet (20 mg total) by mouth daily at 6 PM.   Vitamin D3 125 MCG (5000 UT) Caps Take 5,000 Units by mouth daily.        Procedures/Studies: DG CHEST PORT 1 VIEW  Result Date: 11/18/2022 CLINICAL DATA:  Shortness of breath EXAM: PORTABLE CHEST 1 VIEW COMPARISON:  11/15/2022 FINDINGS: Irregular 3.8 cm in long axis left apical mass with associated fiducials. Atherosclerotic calcification of the aortic arch. Heart size within normal limits. The lungs appear otherwise clear. No blunting of the costophrenic angles. No significant bony abnormality. IMPRESSION: 1. 3.8 cm irregular left apical mass with associated fiducials. 2. Atherosclerotic calcification of the aortic arch. Aortic Atherosclerosis (ICD10-I70.0). 3. No acute findings. Electronically Signed   By: Gaylyn Rong M.D.   On: 11/18/2022 13:50   ECHOCARDIOGRAM COMPLETE  Result Date: 11/16/2022    ECHOCARDIOGRAM REPORT   Patient Name:   Lisa [name] Date of Exam: 11/16/2022 Medical Rec #:  [id number]          Height:       67.0 in Accession #:    [id number]         Weight:       103.8 lb Date of Birth:  1935/03/22           BSA:  1.531 m Patient Age:    87  years           BP:           161/69 mmHg Patient Gender: F                  HR:           88 bpm. [facility] Procedure: 2D Echo, Color Doppler and Cardiac Doppler Indications:    acute diastolic chf  History:        Patient has prior history of Echocardiogram examinations, most                 recent 12/28/2021. COPD and chronic kidney disease,                 Signs/Symptoms:elevated troponin; Risk Factors:Hypertension.  Sonographer:    Delcie Roch RDCS Referring Phys: [id number] Lennart Pall STRADER IMPRESSIONS  1. Left ventricular ejection fraction, by estimation, is 55 to 60%. The left ventricle has normal function. The left ventricle demonstrates regional wall motion abnormalities (see scoring diagram/findings for description). Left ventricular diastolic parameters are consistent with Grade II diastolic dysfunction (pseudonormalization).  2. Right ventricular systolic function is normal. The right ventricular size is normal. There is severely elevated pulmonary artery systolic pressure.  3. Left atrial size was mildly dilated.  4. The mitral valve is abnormal. Mild mitral valve regurgitation. No evidence of mitral stenosis. Severe mitral annular calcification.  5. The aortic valve has an indeterminant number of cusps. Aortic valve regurgitation is not visualized. No aortic stenosis is present.  6. The inferior vena cava is dilated in size with >50% respiratory variability, suggesting right atrial pressure of 8 mmHg. Comparison(s): Prior images reviewed side by side. Changes from prior study are noted. RWMA with akinesis in the basal inferior wall and basal inferoseptal wall are new. FINDINGS  Left Ventricle: Left ventricular ejection fraction, by estimation, is 55 to 60%. The left ventricle has normal function. The left ventricle demonstrates regional wall motion abnormalities. The left ventricular internal cavity size was normal in size. There is no left ventricular hypertrophy. Left  ventricular diastolic parameters are consistent with Grade II diastolic dysfunction (pseudonormalization).  LV Wall Scoring: The basal inferior segment and basal inferoseptal segment are akinetic. Right Ventricle: The right ventricular size is normal. No increase in right ventricular wall thickness. Right ventricular systolic function is normal. There is severely elevated pulmonary artery systolic pressure. The tricuspid regurgitant velocity is 4.25 m/s, and with an assumed right atrial pressure of 8 mmHg, the estimated right ventricular systolic pressure is 80.2 mmHg. Left Atrium: Left atrial size was mildly dilated. Right Atrium: Right atrial size was normal in size. Pericardium: There is no evidence of pericardial effusion. Mitral Valve: The mitral valve is abnormal. Severe mitral annular calcification. Mild mitral valve regurgitation. No evidence of mitral valve stenosis. Tricuspid Valve: The tricuspid valve is grossly normal. Tricuspid valve regurgitation is mild . No evidence of tricuspid stenosis. Aortic Valve: The aortic valve has an indeterminant number of cusps. Aortic valve regurgitation is not visualized. No aortic stenosis is present. Pulmonic Valve: The pulmonic valve was not well visualized. Pulmonic valve regurgitation is trivial. No evidence of pulmonic stenosis. Aorta: The aortic root is normal in size and structure. Venous: The inferior vena cava is dilated in size with greater than 50% respiratory variability, suggesting right atrial pressure of 8 mmHg. IAS/Shunts: No atrial level shunt detected by color flow Doppler.  LEFT  VENTRICLE PLAX 2D LVIDd:         4.00 cm   Diastology LVIDs:         3.00 cm   LV e' medial:    5.44 cm/s LV PW:         1.20 cm   LV E/e' medial:  25.9 LV IVS:        1.10 cm   LV e' lateral:   6.96 cm/s LVOT diam:     1.50 cm   LV E/e' lateral: 20.3 LV SV:         24 LV SV Index:   16 LVOT Area:     1.77 cm  RIGHT VENTRICLE             IVC RV Basal diam:  2.70 cm     IVC  diam: 2.50 cm RV S prime:     12.90 cm/s TAPSE (M-mode): 2.4 cm LEFT ATRIUM             Index        RIGHT ATRIUM           Index LA diam:        4.00 cm 2.61 cm/m   RA Area:     11.40 cm LA Vol (A2C):   50.9 ml 33.26 ml/m  RA Volume:   23.90 ml  15.62 ml/m LA Vol (A4C):   56.9 ml 37.18 ml/m LA Biplane Vol: 56.3 ml 36.78 ml/m  AORTIC VALVE LVOT Vmax:   78.00 cm/s LVOT Vmean:  49.700 cm/s LVOT VTI:    0.137 m  AORTA Ao Root diam: 2.60 cm MITRAL VALVE                TRICUSPID VALVE MV Area (PHT): 4.60 cm     TR Peak grad:   72.2 mmHg MV Decel Time: 165 msec     TR Vmax:        425.00 cm/s MV E velocity: 141.00 cm/s MV A velocity: 72.80 cm/s   SHUNTS MV E/A ratio:  1.94         Systemic VTI:  0.14 m                             Systemic Diam: 1.50 cm Vishnu Priya Mallipeddi Electronically signed by Winfield Rast Mallipeddi Signature Date/Time: 11/16/2022/1:41:45 PM    Final    DG Chest 1 View  Result Date: 11/15/2022 CLINICAL DATA:  Shortness of breath. Acute on chronic respiratory failure. History of COPD and adenocarcinoma of left lung. EXAM: CHEST  1 VIEW COMPARISON:  11/15/2022 FINDINGS: Heart size and pulmonary vascularity are normal. Peribronchial thickening and central interstitial changes consistent with chronic bronchitis. Masslike areas consolidation surrounding surgical clips corresponds to previous neoplasm and treatment changes. No new or developing consolidation or atelectasis. No pleural effusions. No pneumothorax. Calcification of the aorta. IMPRESSION: Chronic bronchitic changes in the lungs. Left upper lobe area of masslike consolidation with surgical clips likely corresponds to known neoplasm and post treatment changes. No change since prior study earlier today. Electronically Signed   By: Burman Nieves M.D.   On: 11/15/2022 23:29   CT Chest Wo Contrast  Result Date: 11/15/2022 CLINICAL DATA:  Shortness of breath. History of lung cancer. Left apical nodular density on a portable chest  radiograph today with an appearance suspicious for recurrent non-small-cell lung cancer. Chest CT was recommended for further evaluation. Status post radiation therapy. EXAM:  CT CHEST WITHOUT CONTRAST TECHNIQUE: Multidetector CT imaging of the chest was performed following the standard protocol without IV contrast. RADIATION DOSE REDUCTION: This exam was performed according to the departmental dose-optimization program which includes automated exposure control, adjustment of the mA and/or kV according to patient size and/or use of iterative reconstruction technique. COMPARISON:  Chest radiographs dated 11/15/2022 and 12/29/2021. Chest CT dated 05/31/2022. FINDINGS: Cardiovascular: Atheromatous calcifications, including the coronary arteries and aorta. Normal sized heart. No pericardial effusion. Mediastinum/Nodes: Marked diffuse enlargement of the thyroid gland with diffuse heterogeneity and calcifications and substernal extension. This is causing mild tracheal narrowing without significant change. No enlarged lymph nodes. The previously demonstrated 9 mm short axis AP window node measures 8 mm in short axis diameter on image number 64/2. Unremarkable esophagus. Lungs/Pleura: Multiple surgical clips in an oval area of consolidation and adjacent prominent interstitial markings in the left upper lobe, containing multiple interval surgical clips. This area measures 5.3 x 2.6 cm on image number 35/4 and corresponds to the mass-like density seen the portable chest obtained earlier today. This extends posteriorly to the major fissure with associated pleural thickening. The previously demonstrated left upper lobe nodule in this area is no longer visualized. No new lung nodules and no pleural fluid. The lungs remain hyperexpanded. Upper Abdomen: Extensive atheromatous arterial calcifications. Cholecystectomy clips. Musculoskeletal: Lower cervical spine degenerative changes and mild thoracic spine degenerative changes.  IMPRESSION: 1. 5.3 x 2.6 cm oval area of consolidation and adjacent prominent interstitial markings in the left upper lobe, corresponding to the mass-like density seen on the portable chest obtained earlier today. This is compatible with postsurgical and post radiation changes. 2. The previously demonstrated left upper lobe nodule in this area is no longer visualized, compatible with interval resection. 3. No enlarged lymph nodes. 4. Stable large thyroid goiter causing mild tracheal narrowing. This has been previously evaluated with imaging. 5. Calcific coronary artery and aortic atherosclerosis. 6. Stable changes of COPD. Aortic Atherosclerosis (ICD10-I70.0) and Emphysema (ICD10-J43.9). Electronically Signed   By: Beckie Salts M.D.   On: 11/15/2022 10:53   DG Chest Port 1 View  Result Date: 11/15/2022 CLINICAL DATA:  Shortness of breath, history of lung cancer. EXAM: PORTABLE CHEST 1 VIEW COMPARISON:  May 29, 2022.  December 29, 2021. FINDINGS: The heart size and mediastinal contours are within normal limits. There is noted large left apical nodular density concerning for malignancy. Surgical clips are seen in this area. Right lung is unremarkable. The visualized skeletal structures are unremarkable. IMPRESSION: Large left apical nodular density is noted concerning for recurrent malignancy. CT scan of the chest is recommended for further evaluation. Electronically Signed   By: Lupita Raider M.D.   On: 11/15/2022 08:18     Subjective: Pt says she really feels great today and eager to go home.  No CP, no SOB.   Discharge Exam: Vitals:   11/21/22 0715 11/21/22 0924  BP:  (!) 155/52  Pulse:  60  Resp:  19  Temp:  98.1 F (36.7 C)  SpO2: 94% 99%   Vitals:   11/20/22 2100 11/21/22 0404 11/21/22 0715 11/21/22 0924  BP: (!) 166/54 (!) 179/56  (!) 155/52  Pulse: 65 (!) 57  60  Resp: 18 18  19   Temp: 98.4 F (36.9 C) 98.2 F (36.8 C)  98.1 F (36.7 C)  TempSrc: Oral Oral  Oral  SpO2: 99% 100%  94% 99%  Weight:       General exam: Appears chronically ill, on  room air oxygen, frail, cachectic, good  mentation, alert, cooperative, calm and comfortable  Respiratory system: BBS clear to auscultation.  Cardiovascular system: normal S1 & S2 heard. No JVD, murmurs, rubs, gallops or clicks. No pedal edema. Gastrointestinal system: Abdomen is nondistended, soft and nontender. No organomegaly or masses felt. Normal bowel sounds heard. Central nervous system: Alert and oriented. No focal neurological deficits. Extremities: Symmetric 5 x 5 power. Skin: No rashes, lesions or ulcers. Psychiatry: Judgement and insight appear normal. Mood & affect appropriate.    The results of significant diagnostics from this hospitalization (including imaging, microbiology, ancillary and laboratory) are listed below for reference.     Microbiology: Recent Results (from the past 240 hour(s))  Resp panel by RT-PCR (RSV, Flu A&B, Covid) Anterior Nasal Swab     Status: None   Collection Time: 11/15/22  8:12 AM   Specimen: Anterior Nasal Swab  Result Value Ref Range Status   SARS Coronavirus 2 by RT PCR NEGATIVE NEGATIVE Final    Comment: (NOTE) SARS-CoV-2 target nucleic acids are NOT DETECTED.  The SARS-CoV-2 RNA is generally detectable in upper respiratory specimens during the acute phase of infection. The lowest concentration of SARS-CoV-2 viral copies this assay can detect is 138 copies/mL. A negative result does not preclude SARS-Cov-2 infection and should not be used as the sole basis for treatment or other patient management decisions. A negative result may occur with  improper specimen collection/handling, submission of specimen other than nasopharyngeal swab, presence of viral mutation(s) within the areas targeted by this assay, and inadequate number of viral copies(<138 copies/mL). A negative result must be combined with clinical observations, patient history, and epidemiological information.  The expected result is Negative.  Fact Sheet for Patients:  BloggerCourse.com  Fact Sheet for Healthcare Providers:  SeriousBroker.it  This test is no t yet approved or cleared by the [place] FDA and  has been authorized for detection and/or diagnosis of SARS-CoV-2 by FDA under an Emergency Use Authorization (EUA). This EUA will remain  in effect (meaning this test can be used) for the duration of the COVID-19 declaration under Section 564(b)(1) of the Act, 21 U.S.C.section 360bbb-3(b)(1), unless the authorization is terminated  or revoked sooner.       Influenza A by PCR NEGATIVE NEGATIVE Final   Influenza B by PCR NEGATIVE NEGATIVE Final    Comment: (NOTE) The Xpert Xpress SARS-CoV-2/FLU/RSV plus assay is intended as an aid in the diagnosis of influenza from Nasopharyngeal swab specimens and should not be used as a sole basis for treatment. Nasal washings and aspirates are unacceptable for Xpert Xpress SARS-CoV-2/FLU/RSV testing.  Fact Sheet for Patients: BloggerCourse.com  Fact Sheet for Healthcare Providers: SeriousBroker.it  This test is not yet approved or cleared by the [place] FDA and has been authorized for detection and/or diagnosis of SARS-CoV-2 by FDA under an Emergency Use Authorization (EUA). This EUA will remain in effect (meaning this test can be used) for the duration of the COVID-19 declaration under Section 564(b)(1) of the Act, 21 U.S.C. section 360bbb-3(b)(1), unless the authorization is terminated or revoked.     Resp Syncytial Virus by PCR NEGATIVE NEGATIVE Final    Comment: (NOTE) Fact Sheet for Patients: BloggerCourse.com  Fact Sheet for Healthcare Providers: SeriousBroker.it  This test is not yet approved or cleared by the [place] FDA and has been authorized for detection and/or  diagnosis of SARS-CoV-2 by FDA under an Emergency Use Authorization (EUA). This EUA will remain in effect (meaning this test  can be used) for the duration of the COVID-19 declaration under Section 564(b)(1) of the Act, 21 U.S.C. section 360bbb-3(b)(1), unless the authorization is terminated or revoked.  Performed at [facility], [address], [place], [place] [address]   MRSA Next Gen by PCR, Nasal     Status: None   Collection Time: 11/15/22  5:57 PM   Specimen: Nasal Mucosa; Nasal Swab  Result Value Ref Range Status   MRSA by PCR Next Gen NOT DETECTED NOT DETECTED Final    Comment: (NOTE) The GeneXpert MRSA Assay (FDA approved for NASAL specimens only), is one component of a comprehensive MRSA colonization surveillance program. It is not intended to diagnose MRSA infection nor to guide or monitor treatment for MRSA infections. Test performance is not FDA approved in patients less than 77 years old. Performed at [facility], [address], [place], [place] [address]      Labs: BNP (last 3 results) Recent Labs    12/27/21 2026 11/15/22 0805 11/16/22 0421  BNP 418.0* 612.0* 1,659.0*   Basic Metabolic Panel: Recent Labs  Lab 11/16/22 0421 11/17/22 0145 11/18/22 0359 11/19/22 0336 11/20/22 0524 11/21/22 0416  NA 138 137 135 135 134* 135  K 4.1 4.2 4.4 4.6 4.3 4.3  CL 103 103 102 103 103 105  CO2 23 24 23 24 24 24   GLUCOSE 122* 127* 126* 122* 107* 96  BUN 73* 99* 106* 116* 115* 118*  CREATININE 1.86* 2.18* 2.05* 1.92* 1.95* 1.72*  CALCIUM 8.7* 8.2* 8.2* 8.2* 8.3* 8.3*  MG 2.0 2.2 2.3  --   --   --   PHOS  --   --   --   --   --  2.5   Liver Function Tests: Recent Labs  Lab 11/15/22 0805 11/21/22 0416  AST 35  --   ALT 23  --   ALKPHOS 59  --   BILITOT 0.4  --   PROT 9.7*  --   ALBUMIN 3.1* 2.4*   No results for input(s): "LIPASE", "AMYLASE" in the last 168 hours. No results for input(s): "AMMONIA" in the last 168 hours. CBC: Recent Labs  Lab  11/15/22 0805 11/16/22 0421 11/17/22 0145 11/18/22 0359 11/19/22 0336 11/20/22 0524 11/21/22 0416  WBC 4.9 3.4* 5.7 6.2 4.8 3.8* 3.6*  NEUTROABS 3.3 2.9 5.0 5.4  --   --   --   HGB 9.2* 9.0* 7.9* 10.3* 9.3* 9.0* 8.3*  HCT 28.6* 27.9* 24.0* 31.0* 28.5* 27.8* 25.2*  MCV 97.3 97.2 94.5 93.4 93.4 94.9 92.3  PLT 91* 102* 102* 100* 88* 95* 99*   Cardiac Enzymes: No results for input(s): "CKTOTAL", "CKMB", "CKMBINDEX", "TROPONINI" in the last 168 hours. BNP: Invalid input(s): "POCBNP" CBG: Recent Labs  Lab 11/18/22 1630  GLUCAP 181*   D-Dimer No results for input(s): "DDIMER" in the last 72 hours. Hgb A1c No results for input(s): "HGBA1C" in the last 72 hours. Lipid Profile No results for input(s): "CHOL", "HDL", "LDLCALC", "TRIG", "CHOLHDL", "LDLDIRECT" in the last 72 hours. Thyroid function studies No results for input(s): "TSH", "T4TOTAL", "T3FREE", "THYROIDAB" in the last 72 hours.  Invalid input(s): "FREET3" Anemia work up No results for input(s): "VITAMINB12", "FOLATE", "FERRITIN", "TIBC", "IRON", "RETICCTPCT" in the last 72 hours. Urinalysis    Component Value Date/Time   COLORURINE YELLOW 02/07/2021 1550   APPEARANCEUR HAZY (A) 02/07/2021 1550   LABSPEC 1.018 02/07/2021 1550   PHURINE 5.0 02/07/2021 1550   GLUCOSEU NEGATIVE 02/07/2021 1550   HGBUR NEGATIVE 02/07/2021 1550  BILIRUBINUR NEGATIVE 02/07/2021 1550   KETONESUR 5 (A) 02/07/2021 1550   PROTEINUR 30 (A) 02/07/2021 1550   UROBILINOGEN 0.2 06/02/2014 1715   NITRITE NEGATIVE 02/07/2021 1550   LEUKOCYTESUR NEGATIVE 02/07/2021 1550   Sepsis Labs Recent Labs  Lab 11/18/22 0359 11/19/22 0336 11/20/22 0524 11/21/22 0416  WBC 6.2 4.8 3.8* 3.6*   Microbiology Recent Results (from the past 240 hour(s))  Resp panel by RT-PCR (RSV, Flu A&B, Covid) Anterior Nasal Swab     Status: None   Collection Time: 11/15/22  8:12 AM   Specimen: Anterior Nasal Swab  Result Value Ref Range Status   SARS Coronavirus 2  by RT PCR NEGATIVE NEGATIVE Final    Comment: (NOTE) SARS-CoV-2 target nucleic acids are NOT DETECTED.  The SARS-CoV-2 RNA is generally detectable in upper respiratory specimens during the acute phase of infection. The lowest concentration of SARS-CoV-2 viral copies this assay can detect is 138 copies/mL. A negative result does not preclude SARS-Cov-2 infection and should not be used as the sole basis for treatment or other patient management decisions. A negative result may occur with  improper specimen collection/handling, submission of specimen other than nasopharyngeal swab, presence of viral mutation(s) within the areas targeted by this assay, and inadequate number of viral copies(<138 copies/mL). A negative result must be combined with clinical observations, patient history, and epidemiological information. The expected result is Negative.  Fact Sheet for Patients:  BloggerCourse.com  Fact Sheet for Healthcare Providers:  SeriousBroker.it  This test is no t yet approved or cleared by the [place] FDA and  has been authorized for detection and/or diagnosis of SARS-CoV-2 by FDA under an Emergency Use Authorization (EUA). This EUA will remain  in effect (meaning this test can be used) for the duration of the COVID-19 declaration under Section 564(b)(1) of the Act, 21 U.S.C.section 360bbb-3(b)(1), unless the authorization is terminated  or revoked sooner.       Influenza A by PCR NEGATIVE NEGATIVE Final   Influenza B by PCR NEGATIVE NEGATIVE Final    Comment: (NOTE) The Xpert Xpress SARS-CoV-2/FLU/RSV plus assay is intended as an aid in the diagnosis of influenza from Nasopharyngeal swab specimens and should not be used as a sole basis for treatment. Nasal washings and aspirates are unacceptable for Xpert Xpress SARS-CoV-2/FLU/RSV testing.  Fact Sheet for Patients: BloggerCourse.com  Fact  Sheet for Healthcare Providers: SeriousBroker.it  This test is not yet approved or cleared by the [place] FDA and has been authorized for detection and/or diagnosis of SARS-CoV-2 by FDA under an Emergency Use Authorization (EUA). This EUA will remain in effect (meaning this test can be used) for the duration of the COVID-19 declaration under Section 564(b)(1) of the Act, 21 U.S.C. section 360bbb-3(b)(1), unless the authorization is terminated or revoked.     Resp Syncytial Virus by PCR NEGATIVE NEGATIVE Final    Comment: (NOTE) Fact Sheet for Patients: BloggerCourse.com  Fact Sheet for Healthcare Providers: SeriousBroker.it  This test is not yet approved or cleared by the [place] FDA and has been authorized for detection and/or diagnosis of SARS-CoV-2 by FDA under an Emergency Use Authorization (EUA). This EUA will remain in effect (meaning this test can be used) for the duration of the COVID-19 declaration under Section 564(b)(1) of the Act, 21 U.S.C. section 360bbb-3(b)(1), unless the authorization is terminated or revoked.  Performed at [facility], [address], [place], [place] [address]   MRSA Next Gen by PCR, Nasal     Status: None  Collection Time: 11/15/22  5:57 PM   Specimen: Nasal Mucosa; Nasal Swab  Result Value Ref Range Status   MRSA by PCR Next Gen NOT DETECTED NOT DETECTED Final    Comment: (NOTE) The GeneXpert MRSA Assay (FDA approved for NASAL specimens only), is one component of a comprehensive MRSA colonization surveillance program. It is not intended to diagnose MRSA infection nor to guide or monitor treatment for MRSA infections. Test performance is not FDA approved in patients less than 48 years old. Performed at [facility] [address], [place], [place] [address]    Time coordinating discharge: 38 mins   SIGNED:  Standley Dakins, MD  [facility] 11/21/2022, 11:04 AM How to contact the [facility] Attending or Consulting provider 7A - 7P or covering provider during after hours 7P -7A, for this patient?  Check the care team in [facility] and look for a) attending/consulting TRH provider listed and b) the [facility] team listed Log into [email or web address] and use [place]'s universal password to access. If you do not have the password, please contact the hospital operator. Locate the [facility] provider you are looking for under [facility] and page to a number that you can be directly reached. If you still have difficulty reaching the provider, please page the [facility] (Director on Call) for the Hospitalists listed on amion for assistance.

## 2022-11-21 NOTE — TOC Transition Note (Signed)
Transition of Care [facility]) - CM/SW Discharge Note   Patient Details  Name: Lisa [name] MRN: [id number] Date of Birth: Feb 16, 1935  [facility]) CM/SW Contact:  Villa Herb, LCSWA Phone Number: 11/21/2022, 11:54 AM   Clinical Narrative:    CSW updated that PT is recommending HH PT at D/C. CSW spoke to pt and niece both are agreeable to [facility] PT/OT being set up and do not have an agency preference. CSW spoke to [place] with [facility] who accepts referral, CSW also informed that niece is best contact and provided nieces number to [facility] agency. MD placed [facility] PT/OT orders. CSW added agency info to AVS. TOC signing off.   Final next level of care: Home w Home Health Services Barriers to Discharge: Barriers Resolved   Patient Goals and CMS Choice CMS Medicare.gov Compare Post Acute Care list provided to:: Patient Choice offered to / list presented to : Patient  Discharge Placement                         Discharge Plan and Services Additional resources added to the After Visit Summary for   In-house Referral: Clinical Social Work                        HH Arranged: PT, OT [facility] Agency: [facility] Date HH Agency Contacted: 11/21/22   Representative spoke with at [facility] Agency: Morrie Sheldon  Social Determinants of Health (SDOH) Interventions SDOH Screenings   Food Insecurity: No Food Insecurity (11/15/2022)  Housing: Low Risk  (11/15/2022)  Transportation Needs: No Transportation Needs (11/15/2022)  Utilities: Not At Risk (11/15/2022)  Alcohol Screen: Low Risk  (10/24/2022)  Depression (PHQ2-9): Low Risk  (10/24/2022)  Financial Resource Strain: Low Risk  (10/24/2022)  Physical Activity: Inactive (10/24/2022)  Social Connections: Moderately Isolated (10/24/2022)  Stress: No Stress Concern Present (10/24/2022)  Tobacco Use: Medium Risk (11/16/2022)     Readmission Risk Interventions    11/16/2022    1:13 PM 09/06/2021    9:23 AM  Readmission Risk  Prevention Plan  Transportation Screening Complete Complete  PCP or Specialist Appt within 3-5 Days  Complete  HRI or Home Care Consult Complete Complete  Social Work Consult for Recovery Care Planning/Counseling Complete Complete  Palliative Care Screening Complete Not Applicable  Medication Review Oceanographer) Complete Complete

## 2022-11-21 NOTE — Telephone Encounter (Signed)
TOC call needed pt  dc  11/21/22 Acute hypoxic respiratory failure (HCC)   11/15/2022 - 11/21/2022   [facility] MEDICAL SURGICAL UNIT   [phone]    TOC appt made

## 2022-11-21 NOTE — Progress Notes (Signed)
Palliative: Lisa [name] is sitting up in the [place] chair in her room.  She greets me, making and mostly keeping eye contact.  She appears chronically ill and somewhat frail.  She is alert and oriented, able to make her needs known.  Physical therapy and bedside nursing staff are present attending to needs.  Lisa [name] tells me that, after working with PT, she feels that she can manage her needs at home with the help of her family.  Her nephew, who is retired, lives in her home and is there most of the time.  She also has other family members available to help as needed.  We talk about the benefits of home health services.  During our conversation transition.  She called.  Lisa [name] is agreeable to home health services.  We also talk about the benefits of outpatient palliative services for further support.  We Lisa [name] is also agreeable for outpatient palliative.  Provider choice offered.  Provider [place].   Conference with attending, bedside nursing staff, transition of care team related to patient condition, needs, goals of care, disposition.  Plan:   Continue to treat the treatable but no CPR or intubation.  Home with home health.  Outpatient palliative services through [facility] DNR/goldenrod form completed and placed on chart.  50 minutes Lillia Carmel, NP Palliative medicine team Team phone [phone] Greater than 50% of this time was spent counseling and coordinating care related to the above assessment and plan.

## 2022-11-21 NOTE — Progress Notes (Signed)
Patient slept through this shift. At 2315 Telemetry called patient had 8 beat run of V-tach. Patient returned to sinus brady at 58 the rest of the night. No complaints of pain. Continued to monitor.

## 2022-11-21 NOTE — Progress Notes (Signed)
Patient discharged with instructions given on medications and follow up visits,patient and family verbalized understanding. Prescriptions sent to Pharmacy of choice documented on AVS. IV  discontinued,catheter intact. Accompanied by staff to an awaiting vehicle. 

## 2022-11-21 NOTE — Care Management Important Message (Signed)
Important Message  Patient Details  Name: Athleen Feltner MRN: [id number] Date of Birth: Apr 01, 1935   Medicare Important Message Given:  Yes (spoke with niece [name] at [phone] to review letter, no additonal copy needed)     Corey Harold 11/21/2022, 11:53 AM

## 2022-11-21 NOTE — Evaluation (Signed)
Physical Therapy Evaluation Patient Details Name: Lisa [name] MRN: [id number] DOB: 31-Aug-1934 Today's Date: 11/21/2022  History of Present Illness  Lisa [name] is a 88 year old female with stage IIIb CKD, chronic hepatitis C, former heavy tobacco user, hypertension, COPD, malignant neoplasm of left lung, treated with radiation, smoldering myeloma, goiter, monoclonal gammopathy of unknown significance, chronic dyspnea, coronary artery disease, anemia and CKD, diastolic heart failure with failure to thrive presented to the emergency department by EMS with acute onset of severe shortness of breath.  She has had progressive worsening shortness of breath since yesterday.  She started showing more signs of respiratory stridor and grunting and EMS noted pulse ox 85% on room air.  She complained of chest tightness and chest pain.  She is not normally on supplemental oxygen but was placed on nasal cannula en route.  Patient was severely hypertensive on arrival with systolic blood pressure of 238.  She was given albuterol bronchodilator treatment and IV steroids.  Her chest x-ray and CT chest showed chronic findings.  Unfortunately her respiratory distress progressed and she required continuous BiPAP therapy.  She was not able to have IV contrast for CTA chest and VQ scan not completed.  She is being admitted for acute respiratory distress presumably secondary to his COPD exacerbation.  After discussion with her healthcare surrogate decision made to make patient DNR but continue BiPAP treatments and stepdown ICU care.  Influenza and COVID testing negative.   Clinical Impression  Patient requires verbal/tactile cueing for completing stand to sitting due to dropping into chair, bedside without reaching behind, demonstrates slow labored cadence with frequent drifting left/right when taking steps in room and limited mostly due to fatigue and c/o SOB.  Patient encouraged to have family members assisting her  during transfers and walking in home for safety.  PLAN:  Patient to be discharged home today and discharged from acute physical therapy to care of nursing for ambulation as tolerated for length of stay with recommendations stated below        Recommendations for follow up therapy are one component of a multi-disciplinary discharge planning process, led by the attending physician.  Recommendations may be updated based on patient status, additional functional criteria and insurance authorization.  Follow Up Recommendations       Assistance Recommended at Discharge Set up Supervision/Assistance  Patient can return home with the following  A little help with bathing/dressing/bathroom;Help with stairs or ramp for entrance;Assistance with cooking/housework;A lot of help with walking and/or transfers    Equipment Recommendations None recommended by PT  Recommendations for Other Services       Functional Status Assessment Patient has had a recent decline in their functional status and demonstrates the ability to make significant improvements in function in a reasonable and predictable amount of time.     Precautions / Restrictions Precautions Precautions: Fall Restrictions Weight Bearing Restrictions: No      Mobility  Bed Mobility Overal bed mobility: Needs Assistance Bed Mobility: Supine to Sit, Sit to Supine     Supine to sit: Supervision, Modified independent (Device/Increase time) Sit to supine: Modified independent (Device/Increase time), Supervision   General bed mobility comments: required rest breaks due to SOB, slightly labored movement    Transfers Overall transfer level: Needs assistance Equipment used: Rolling walker (2 wheels) Transfers: Sit to/from Stand, Bed to chair/wheelchair/BSC Sit to Stand: Min assist   Step pivot transfers: Min assist       General transfer comment: requires verbal/tactile cueing  for safety due to flopping in chair/bed during stand to  sitting    Ambulation/Gait Ambulation/Gait assistance: Min assist Gait Distance (Feet): 20 Feet Assistive device: Rolling walker (2 wheels) Gait Pattern/deviations: Decreased step length - right, Decreased step length - left, Decreased stride length, Staggering right, Staggering left Gait velocity: slow     General Gait Details: slow labored unsteady cadence with frequent staggering left/right and limited mostly due to c/o fatigue and SOB  Stairs            Wheelchair Mobility    Modified Rankin (Stroke Patients Only)       Balance Overall balance assessment: Needs assistance Sitting-balance support: Feet supported, No upper extremity supported Sitting balance-Leahy Scale: Fair Sitting balance - Comments: fair/good seated at EOB   Standing balance support: Reliant on assistive device for balance, During functional activity, Bilateral upper extremity supported Standing balance-Leahy Scale: Fair Standing balance comment: using RW                             Pertinent Vitals/Pain Pain Assessment Pain Assessment: No/denies pain    Home Living Family/patient expects to be discharged to:: Private residence Living Arrangements: Alone Available Help at Discharge: Family;Available PRN/intermittently Type of Home: House Home Access: Stairs to enter Entrance Stairs-Rails: Can reach both;Right;Left Entrance Stairs-Number of Steps: 2 Alternate Level Stairs-Number of Steps: can stay on main level, has ramp to 2nd level Home Layout: Two level;Able to live on main level with bedroom/bathroom Home Equipment: Rolling Walker (2 wheels);Rollator (4 wheels);BSC/3in1;Shower seat      Prior Function Prior Level of Function : Needs assist       Physical Assist : Mobility (physical);ADLs (physical) Mobility (physical): Bed mobility;Transfers;Gait;Stairs   Mobility Comments: household ambulator using RW, uses electric scooter when in stores ADLs Comments: assisted by  family     Hand Dominance   Dominant Hand: Right    Extremity/Trunk Assessment   Upper Extremity Assessment Upper Extremity Assessment: Generalized weakness    Lower Extremity Assessment Lower Extremity Assessment: Generalized weakness    Cervical / Trunk Assessment Cervical / Trunk Assessment: Normal  Communication   Communication: No difficulties  Cognition Arousal/Alertness: Awake/alert Behavior During Therapy: WFL for tasks assessed/performed Overall Cognitive Status: Within Functional Limits for tasks assessed                                          General Comments      Exercises     Assessment/Plan    PT Assessment All further PT needs can be met in the next venue of care  PT Problem List Decreased strength;Decreased activity tolerance;Decreased balance;Decreased mobility       PT Treatment Interventions      PT Goals (Current goals can be found in the Care Plan section)  Acute Rehab PT Goals Patient Stated Goal: return home with family to assist PT Goal Formulation: With patient Time For Goal Achievement: 11/21/22 Potential to Achieve Goals: Good    Frequency       Co-evaluation               AM-PAC PT "6 Clicks" Mobility  Outcome Measure Help needed turning from your back to your side while in a flat bed without using bedrails?: None Help needed moving from lying on your back to sitting on the side of a  flat bed without using bedrails?: None Help needed moving to and from a bed to a chair (including a wheelchair)?: A Little Help needed standing up from a chair using your arms (e.g., wheelchair or bedside chair)?: A Little Help needed to walk in hospital room?: A Lot Help needed climbing 3-5 steps with a railing? : A Lot 6 Click Score: 18    End of Session   Activity Tolerance: Patient tolerated treatment well;Patient limited by fatigue Patient left: in chair;with call bell/phone within reach Nurse Communication:  Mobility status PT Visit Diagnosis: Unsteadiness on feet (R26.81);Other abnormalities of gait and mobility (R26.89);Muscle weakness (generalized) (M62.81)    Time: 1610-9604 PT Time Calculation (min) (ACUTE ONLY): 15 min   Charges:   PT Evaluation $PT Eval Low Complexity: 1 Low PT Treatments $Therapeutic Activity: 8-22 mins        11:57 AM, 11/21/22 Ocie Bob, MPT Physical Therapist with [facility] [phone] office [phone] mobile phone

## 2022-11-21 NOTE — Discharge Instructions (Addendum)
Weigh yourself daily and take 1 lasix tablet if you gain 3 pounds or more or have any puffiness or edema    IMPORTANT INFORMATION: PAY CLOSE ATTENTION   PHYSICIAN DISCHARGE INSTRUCTIONS  Follow with Primary care provider  [name], MD  and other consultants as instructed by your Hospitalist Physician  SEEK MEDICAL CARE OR RETURN TO EMERGENCY ROOM IF SYMPTOMS COME BACK, WORSEN OR NEW PROBLEM DEVELOPS   Please note: You were cared for by a hospitalist during your hospital stay. Every effort will be made to forward records to your primary care provider.  You can request that your primary care provider send for your hospital records if they have not received them.  Once you are discharged, your primary care physician will handle any further medical issues. Please note that NO REFILLS for any discharge medications will be authorized once you are discharged, as it is imperative that you return to your primary care physician (or establish a relationship with a primary care physician if you do not have one) for your post hospital discharge needs so that they can reassess your need for medications and monitor your lab values.  Please get a complete blood count and chemistry panel checked by your Primary MD at your next visit, and again as instructed by your Primary MD.  Get Medicines reviewed and adjusted: Please take all your medications with you for your next visit with your Primary MD  Laboratory/radiological data: Please request your Primary MD to go over all hospital tests and procedure/radiological results at the follow up, please ask your primary care provider to get all Hospital records sent to his/her office.  In some cases, they will be blood work, cultures and biopsy results pending at the time of your discharge. Please request that your primary care provider follow up on these results.  If you are diabetic, please bring your blood sugar readings with you to your follow up  appointment with primary care.    Please call and make your follow up appointments as soon as possible.    Also Note the following: If you experience worsening of your admission symptoms, develop shortness of breath, life threatening emergency, suicidal or homicidal thoughts you must seek medical attention immediately by calling 911 or calling your MD immediately  if symptoms less severe.  You must read complete instructions/literature along with all the possible adverse reactions/side effects for all the Medicines you take and that have been prescribed to you. Take any new Medicines after you have completely understood and accpet all the possible adverse reactions/side effects.   Do not drive when taking Pain medications or sleeping medications (Benzodiazepines)  Do not take more than prescribed Pain, Sleep and Anxiety Medications. It is not advisable to combine anxiety,sleep and pain medications without talking with your primary care practitioner  Special Instructions: If you have smoked or chewed Tobacco  in the last 2 yrs please stop smoking, stop any regular Alcohol  and or any Recreational drug use.  Wear Seat belts while driving.  Do not drive if taking any narcotic, mind altering or controlled substances or recreational drugs or alcohol.

## 2022-11-22 DIAGNOSIS — M6281 Muscle weakness (generalized): Secondary | ICD-10-CM | POA: Diagnosis not present

## 2022-11-22 DIAGNOSIS — Z87440 Personal history of urinary (tract) infections: Secondary | ICD-10-CM | POA: Diagnosis not present

## 2022-11-22 DIAGNOSIS — J9621 Acute and chronic respiratory failure with hypoxia: Secondary | ICD-10-CM | POA: Diagnosis not present

## 2022-11-22 DIAGNOSIS — I2510 Atherosclerotic heart disease of native coronary artery without angina pectoris: Secondary | ICD-10-CM | POA: Diagnosis not present

## 2022-11-22 DIAGNOSIS — N184 Chronic kidney disease, stage 4 (severe): Secondary | ICD-10-CM | POA: Diagnosis not present

## 2022-11-22 DIAGNOSIS — Z85118 Personal history of other malignant neoplasm of bronchus and lung: Secondary | ICD-10-CM | POA: Diagnosis not present

## 2022-11-22 DIAGNOSIS — N179 Acute kidney failure, unspecified: Secondary | ICD-10-CM | POA: Diagnosis not present

## 2022-11-22 DIAGNOSIS — R627 Adult failure to thrive: Secondary | ICD-10-CM | POA: Diagnosis not present

## 2022-11-22 DIAGNOSIS — Z556 Problems related to health literacy: Secondary | ICD-10-CM | POA: Diagnosis not present

## 2022-11-22 DIAGNOSIS — D631 Anemia in chronic kidney disease: Secondary | ICD-10-CM | POA: Diagnosis not present

## 2022-11-22 DIAGNOSIS — I214 Non-ST elevation (NSTEMI) myocardial infarction: Secondary | ICD-10-CM | POA: Diagnosis not present

## 2022-11-22 DIAGNOSIS — Z7952 Long term (current) use of systemic steroids: Secondary | ICD-10-CM | POA: Diagnosis not present

## 2022-11-22 DIAGNOSIS — J9610 Chronic respiratory failure, unspecified whether with hypoxia or hypercapnia: Secondary | ICD-10-CM | POA: Diagnosis not present

## 2022-11-22 DIAGNOSIS — R262 Difficulty in walking, not elsewhere classified: Secondary | ICD-10-CM | POA: Diagnosis not present

## 2022-11-22 DIAGNOSIS — I5033 Acute on chronic diastolic (congestive) heart failure: Secondary | ICD-10-CM | POA: Diagnosis not present

## 2022-11-22 DIAGNOSIS — E46 Unspecified protein-calorie malnutrition: Secondary | ICD-10-CM | POA: Diagnosis not present

## 2022-11-22 DIAGNOSIS — Z87891 Personal history of nicotine dependence: Secondary | ICD-10-CM | POA: Diagnosis not present

## 2022-11-22 DIAGNOSIS — J441 Chronic obstructive pulmonary disease with (acute) exacerbation: Secondary | ICD-10-CM | POA: Diagnosis not present

## 2022-11-22 DIAGNOSIS — I130 Hypertensive heart and chronic kidney disease with heart failure and stage 1 through stage 4 chronic kidney disease, or unspecified chronic kidney disease: Secondary | ICD-10-CM | POA: Diagnosis not present

## 2022-11-22 DIAGNOSIS — Z7982 Long term (current) use of aspirin: Secondary | ICD-10-CM | POA: Diagnosis not present

## 2022-11-22 NOTE — Telephone Encounter (Signed)
Transition Care Management Follow-up Telephone Call Date of discharge and from where: 11/21/22 from [facility] How have you been since you were released from the hospital? Good Any questions or concerns? No  Items Reviewed: Did the pt receive and understand the discharge instructions provided? Yes  Medications obtained and verified? Yes  Other? No  Any new allergies since your discharge? No  Dietary orders reviewed? Yes Do you have support at home? Yes   Home Care and Equipment/Supplies: Were home health services ordered? yes If so, what is the name of the agency? N/a  Has the agency set up a time to come to the patient's home? no Were any new equipment or medical supplies ordered?  No What is the name of the medical supply agency? N/a Were you able to get the supplies/equipment? yes Do you have any questions related to the use of the equipment or supplies? No  Functional Questionnaire: (I = Independent and D = Dependent) ADLs: i  Bathing/Dressing- i  Meal Prep- i  Eating- i  Maintaining continence- i  Transferring/Ambulation- i  Managing Meds- i  Follow up appointments reviewed:  PCP Hospital f/u appt confirmed? Yes  Scheduled to see [place] on 11/24/22 @ 10:40am.. Specialist Hospital f/u appt confirmed? No  Scheduled to see N/A on N/A @ N/A. Are transportation arrangements needed? No  If their condition worsens, is the pt aware to call PCP or go to the Emergency Dept.? Yes Was the patient provided with contact information for the PCP's office or ED? Yes Was to pt encouraged to call back with questions or concerns? Yes

## 2022-11-22 NOTE — Telephone Encounter (Signed)
Toc done

## 2022-11-23 ENCOUNTER — Telehealth: Payer: Self-pay | Admitting: Internal Medicine

## 2022-11-23 DIAGNOSIS — I214 Non-ST elevation (NSTEMI) myocardial infarction: Secondary | ICD-10-CM | POA: Diagnosis not present

## 2022-11-23 DIAGNOSIS — J9621 Acute and chronic respiratory failure with hypoxia: Secondary | ICD-10-CM | POA: Diagnosis not present

## 2022-11-23 DIAGNOSIS — M6281 Muscle weakness (generalized): Secondary | ICD-10-CM | POA: Diagnosis not present

## 2022-11-23 DIAGNOSIS — Z87891 Personal history of nicotine dependence: Secondary | ICD-10-CM | POA: Diagnosis not present

## 2022-11-23 DIAGNOSIS — R262 Difficulty in walking, not elsewhere classified: Secondary | ICD-10-CM | POA: Diagnosis not present

## 2022-11-23 DIAGNOSIS — N179 Acute kidney failure, unspecified: Secondary | ICD-10-CM | POA: Diagnosis not present

## 2022-11-23 DIAGNOSIS — Z7982 Long term (current) use of aspirin: Secondary | ICD-10-CM | POA: Diagnosis not present

## 2022-11-23 DIAGNOSIS — J9610 Chronic respiratory failure, unspecified whether with hypoxia or hypercapnia: Secondary | ICD-10-CM | POA: Diagnosis not present

## 2022-11-23 DIAGNOSIS — J441 Chronic obstructive pulmonary disease with (acute) exacerbation: Secondary | ICD-10-CM | POA: Diagnosis not present

## 2022-11-23 DIAGNOSIS — Z85118 Personal history of other malignant neoplasm of bronchus and lung: Secondary | ICD-10-CM | POA: Diagnosis not present

## 2022-11-23 DIAGNOSIS — N184 Chronic kidney disease, stage 4 (severe): Secondary | ICD-10-CM | POA: Diagnosis not present

## 2022-11-23 DIAGNOSIS — E46 Unspecified protein-calorie malnutrition: Secondary | ICD-10-CM | POA: Diagnosis not present

## 2022-11-23 DIAGNOSIS — D631 Anemia in chronic kidney disease: Secondary | ICD-10-CM | POA: Diagnosis not present

## 2022-11-23 DIAGNOSIS — I130 Hypertensive heart and chronic kidney disease with heart failure and stage 1 through stage 4 chronic kidney disease, or unspecified chronic kidney disease: Secondary | ICD-10-CM | POA: Diagnosis not present

## 2022-11-23 DIAGNOSIS — I2510 Atherosclerotic heart disease of native coronary artery without angina pectoris: Secondary | ICD-10-CM | POA: Diagnosis not present

## 2022-11-23 DIAGNOSIS — Z7952 Long term (current) use of systemic steroids: Secondary | ICD-10-CM | POA: Diagnosis not present

## 2022-11-23 DIAGNOSIS — R627 Adult failure to thrive: Secondary | ICD-10-CM | POA: Diagnosis not present

## 2022-11-23 DIAGNOSIS — Z87440 Personal history of urinary (tract) infections: Secondary | ICD-10-CM | POA: Diagnosis not present

## 2022-11-23 DIAGNOSIS — Z556 Problems related to health literacy: Secondary | ICD-10-CM | POA: Diagnosis not present

## 2022-11-23 DIAGNOSIS — I5033 Acute on chronic diastolic (congestive) heart failure: Secondary | ICD-10-CM | POA: Diagnosis not present

## 2022-11-23 NOTE — Telephone Encounter (Signed)
Lisa [name] called from [facility] and left voicemail needs verbal orders call back # [phone].

## 2022-11-24 ENCOUNTER — Encounter: Payer: Self-pay | Admitting: Internal Medicine

## 2022-11-24 ENCOUNTER — Ambulatory Visit (INDEPENDENT_AMBULATORY_CARE_PROVIDER_SITE_OTHER): Payer: Medicare Other | Admitting: Internal Medicine

## 2022-11-24 VITALS — BP 126/52 | HR 58 | Ht 67.0 in | Wt 108.0 lb

## 2022-11-24 DIAGNOSIS — I214 Non-ST elevation (NSTEMI) myocardial infarction: Secondary | ICD-10-CM | POA: Diagnosis not present

## 2022-11-24 DIAGNOSIS — I509 Heart failure, unspecified: Secondary | ICD-10-CM | POA: Diagnosis not present

## 2022-11-24 DIAGNOSIS — H9193 Unspecified hearing loss, bilateral: Secondary | ICD-10-CM

## 2022-11-24 DIAGNOSIS — J9601 Acute respiratory failure with hypoxia: Secondary | ICD-10-CM | POA: Diagnosis not present

## 2022-11-24 DIAGNOSIS — J438 Other emphysema: Secondary | ICD-10-CM

## 2022-11-24 DIAGNOSIS — I160 Hypertensive urgency: Secondary | ICD-10-CM | POA: Diagnosis not present

## 2022-11-24 DIAGNOSIS — D631 Anemia in chronic kidney disease: Secondary | ICD-10-CM

## 2022-11-24 DIAGNOSIS — E43 Unspecified severe protein-calorie malnutrition: Secondary | ICD-10-CM

## 2022-11-24 DIAGNOSIS — H9190 Unspecified hearing loss, unspecified ear: Secondary | ICD-10-CM | POA: Insufficient documentation

## 2022-11-24 DIAGNOSIS — L602 Onychogryphosis: Secondary | ICD-10-CM | POA: Diagnosis not present

## 2022-11-24 DIAGNOSIS — N184 Chronic kidney disease, stage 4 (severe): Secondary | ICD-10-CM

## 2022-11-24 DIAGNOSIS — Z7189 Other specified counseling: Secondary | ICD-10-CM | POA: Diagnosis not present

## 2022-11-24 NOTE — Assessment & Plan Note (Signed)
Recent hospital admission for acute hypoxic respiratory failure, partially attributable to acute CHF exacerbation.  Treated with IV diuretics.  Respiratory status has significantly improved.  She is euvolemic on exam today. -Continue Lasix 20 mg daily as needed.

## 2022-11-24 NOTE — Assessment & Plan Note (Signed)
Hgb 8.3 on the day of hospital discharge.  Received 1 unit PRBCs on 6/7 as hemoglobin decreased to 7.8. -Repeat CBC ordered today

## 2022-11-24 NOTE — Assessment & Plan Note (Signed)
Podiatry referral placed today

## 2022-11-24 NOTE — Assessment & Plan Note (Signed)
[name] code status was confirmed again today.  She wishes to be DNR.  She would not want extensive life-sustaining measures if her clinical condition were to deteriorate and she would prefer to pass peacefully.  Code status is appropriately updated in her chart.

## 2022-11-24 NOTE — Assessment & Plan Note (Signed)
Audiology referral placed today at family's request.

## 2022-11-24 NOTE — Assessment & Plan Note (Signed)
Repeat labs ordered today 

## 2022-11-24 NOTE — Telephone Encounter (Signed)
Gave verbal orders. 

## 2022-11-24 NOTE — Assessment & Plan Note (Signed)
Albumin 2.4.  I recommended starting daily protein supplement shakes.

## 2022-11-24 NOTE — Assessment & Plan Note (Signed)
Presents today for hospital follow-up in the setting of acute hypoxic respiratory failure.  Multifactorial etiology suspected.  Treated for COPD exacerbation, HTN emergency, and HF exacerbation.  Pulmonary exam is unremarkable today.  She is maintaining adequate O2 sats on room air.

## 2022-11-24 NOTE — Progress Notes (Signed)
Established Patient Office Visit  Subjective   Patient ID: Lisa [name], female    DOB: 1934-10-15  Age: 88 y.o. MRN: [id number]  Chief Complaint  Patient presents with   Transitions Of Care    D/c 11/15/2022   [name] presents today for hospital follow-up.  Recent hospital admission 6/5 - 6/7 in the setting of acute hypoxic respiratory failure.  She presented to the emergency department on 6/5 due to shortness of breath with increased work of breathing.  She was found to be hypoxic, placed on supplemental oxygen with escalating O2 requirements, ultimately being placed on BiPAP.  Multifactorial etiology suspected in the setting of acute heart failure and COPD exacerbations, as well as HTN emergency.  Treated with IV diuretics, steroids, and antibiotics.  Her O2 requirements gradually decreased and she was ultimately discharged home on room air.  Meeting criteria for NSTEMI during admission.  Evaluated by cardiology, who recommended no invasive testing and optimized her medications.  Her hemoglobin decreased to 7.8.  She was transfused 1 unit PRBCs on 6/7.  [name] reports feeling well today.  She states that her respiratory status has significantly improved since hospital admission.  She does not have any acute concerns to discuss today.  Her niece, Lisa [name], was available via phone during today's encounter.  She has requested referrals for audiology and podiatry.   Past Medical History:  Diagnosis Date   Anemia    Carotid artery stenosis    CKD (chronic kidney disease)    COPD (chronic obstructive pulmonary disease) (HCC)    Dyspnea    Due to COPD per patient   Goiter diffuse, nontoxic    Per patient   Hepatitis C    History of radiation therapy    Left lung-10/10/21-10/17/21- Dr. Antony Blackbird   Hypertension    Monoclonal gammopathy of unknown significance (MGUS)    Pyelonephritis 04/06/2014   UTI   Past Surgical History:  Procedure Laterality Date   ABDOMINAL HYSTERECTOMY      CHOLECYSTECTOMY N/A 09/17/2018   Procedure: LAPAROSCOPIC CHOLECYSTECTOMY;  Surgeon: Lucretia Roers, MD;  Location: [facility];  Service: General;  Laterality: N/A;   FUDUCIAL PLACEMENT Left 09/01/2021   Procedure: PLACEMENT OF FIDUCIAL MARKERS TIMES FOUR;  Surgeon: Loreli Slot, MD;  Location: [facility] OR;  Service: Thoracic;  Laterality: Left;   TEE WITHOUT CARDIOVERSION N/A 04/10/2014   Procedure: TRANSESOPHAGEAL ECHOCARDIOGRAM (TEE);  Surgeon: Donato Schultz, MD;  Location: [facility] END[facility];  Service: Cardiovascular;  Laterality: N/A;   VIDEO BRONCHOSCOPY WITH ENDOBRONCHIAL NAVIGATION N/A 09/01/2021   Procedure: VIDEO BRONCHOSCOPY WITH ENDOBRONCHIAL NAVIGATION;  Surgeon: Loreli Slot, MD;  Location: [facility] OR;  Service: Thoracic;  Laterality: N/A;   Social History   Tobacco Use   Smoking status: Former    Packs/day: .25    Types: Cigarettes    Passive exposure: Current   Smokeless tobacco: Never   Tobacco comments:    Smoking Cessation Classes, Services, Agencies & Resources Offered.  Vaping Use   Vaping Use: Never used  Substance Use Topics   Alcohol use: No   Drug use: No   Family History  Problem Relation Age of Onset   Hypertension Mother    Thyroid disease Mother    Hypertension Father    Thyroid disease Sister    Thyroid disease Brother    Thyroid disease Sister    No Known Allergies    Review of Systems  Constitutional:  Positive for malaise/fatigue. Negative for chills and  fever.  HENT:  Negative for sore throat.   Respiratory:  Negative for cough and shortness of breath.   Cardiovascular:  Negative for chest pain, palpitations and leg swelling.  Gastrointestinal:  Negative for abdominal pain, blood in stool, constipation, diarrhea, nausea and vomiting.  Genitourinary:  Negative for dysuria and hematuria.  Musculoskeletal:  Negative for myalgias.  Skin:  Negative for itching and rash.  Neurological:  Negative for dizziness and headaches.   Psychiatric/Behavioral:  Negative for depression and suicidal ideas.       Objective:     BP (!) 126/52   Pulse (!) 58   Ht 5\' 7"  (1.702 m)   Wt 108 lb (49 kg)   SpO2 98%   BMI 16.92 kg/m  BP Readings from Last 3 Encounters:  11/24/22 (!) 126/52  11/21/22 (!) 155/52  10/16/22 138/60   Physical Exam Vitals reviewed.  Constitutional:      General: She is not in acute distress.    Appearance: Normal appearance. She is not toxic-appearing.     Comments: Cachectic appearance, examined in wheelchair  HENT:     Head: Normocephalic and atraumatic.     Right Ear: External ear normal.     Left Ear: External ear normal.     Nose: Nose normal. No congestion or rhinorrhea.     Mouth/Throat:     Mouth: Mucous membranes are moist.     Pharynx: Oropharynx is clear. No oropharyngeal exudate or posterior oropharyngeal erythema.  Eyes:     General: No scleral icterus.    Extraocular Movements: Extraocular movements intact.     Conjunctiva/sclera: Conjunctivae normal.     Pupils: Pupils are equal, round, and reactive to light.  Cardiovascular:     Rate and Rhythm: Normal rate and regular rhythm.     Pulses: Normal pulses.     Heart sounds: Normal heart sounds. No murmur heard.    No friction rub. No gallop.  Pulmonary:     Effort: Pulmonary effort is normal.     Breath sounds: Normal breath sounds. No wheezing, rhonchi or rales.  Abdominal:     General: Abdomen is flat. Bowel sounds are normal. There is no distension.     Palpations: Abdomen is soft.     Tenderness: There is no abdominal tenderness.  Musculoskeletal:        General: No swelling.     Cervical back: Normal range of motion.     Right lower leg: Edema present.     Left lower leg: Edema present.  Lymphadenopathy:     Cervical: No cervical adenopathy.  Skin:    General: Skin is warm and dry.     Capillary Refill: Capillary refill takes less than 2 seconds.     Coloration: Skin is not jaundiced.  Neurological:      General: No focal deficit present.     Mental Status: She is alert and oriented to person, place, and time.  Psychiatric:        Mood and Affect: Mood normal.        Behavior: Behavior normal.   Last CBC Lab Results  Component Value Date   WBC 3.6 (L) 11/21/2022   HGB 8.3 (L) 11/21/2022   HCT 25.2 (L) 11/21/2022   MCV 92.3 11/21/2022   MCH 30.4 11/21/2022   RDW 12.9 11/21/2022   PLT 99 (L) 11/21/2022   Last metabolic panel Lab Results  Component Value Date   GLUCOSE 96 11/21/2022   NA 135 11/21/2022  K 4.3 11/21/2022   CL 105 11/21/2022   CO2 24 11/21/2022   BUN 118 (H) 11/21/2022   CREATININE 1.72 (H) 11/21/2022   GFRNONAA 29 (L) 11/21/2022   CALCIUM 8.3 (L) 11/21/2022   PHOS 2.5 11/21/2022   PROT 9.7 (H) 11/15/2022   ALBUMIN 2.4 (L) 11/21/2022   LABGLOB 4.4 (H) 04/10/2022   AGRATIO 0.7 04/10/2022   BILITOT 0.4 11/15/2022   ALKPHOS 59 11/15/2022   AST 35 11/15/2022   ALT 23 11/15/2022   ANIONGAP 6 11/21/2022   Last lipids Lab Results  Component Value Date   CHOL 127 11/17/2022   HDL 43 11/17/2022   LDLCALC 69 11/17/2022   TRIG 77 11/17/2022   CHOLHDL 3.0 11/17/2022   Last thyroid functions Lab Results  Component Value Date   TSH 1.140 04/09/2014   Last vitamin B12 and Folate Lab Results  Component Value Date   VITAMINB12 273 05/29/2022   FOLATE 17.0 05/29/2022     Assessment & Plan:   Problem List Items Addressed This Visit       Hypertensive urgency    BP significantly elevated during recent hospital admission with systolics reaching 230s.  Metoprolol and Imdur were added to her antihypertensive regimen.  BP has normalized today.  Suspect flash pulmonary edema was contributing to respiratory distress. -No medication changes today      Non-ST elevation (NSTEMI) myocardial infarction [facility])    History of CAD.  NSTEMI documented during recent admission, likely demand ischemia in the setting of acute hypoxic respiratory failure.  Evaluated by  cardiology.  No additional invasive testing indicated.  She has been discharged on metoprolol tartrate 25 mg twice daily, amlodipine 5 mg daily, Imdur 60 mg daily, ASA/Plavix, and rosuvastatin 20 mg daily.      Acute congestive heart failure [facility])    Recent hospital admission for acute hypoxic respiratory failure, partially attributable to acute CHF exacerbation.  Treated with IV diuretics.  Respiratory status has significantly improved.  She is euvolemic on exam today. -Continue Lasix 20 mg daily as needed.      COPD (chronic obstructive pulmonary disease) (HCC)    Treated for COPD exacerbation during hospital admission.  Received IV steroids and antibiotics.  Discharged on prednisone, which was completed yesterday (6/13).  She remains on Combivent and DuoNebs.  Pulmonary exam today is unremarkable.      Acute hypoxic respiratory failure (HCC)    Presents today for hospital follow-up in the setting of acute hypoxic respiratory failure.  Multifactorial etiology suspected.  Treated for COPD exacerbation, HTN emergency, and HF exacerbation.  Pulmonary exam is unremarkable today.  She is maintaining adequate O2 sats on room air.      Impaired hearing    Audiology referral placed today at family's request.      Overgrown toenails    Podiatry referral placed today      CKD (chronic kidney disease) stage 4, GFR 15-29 ml/min (HCC)    Repeat labs ordered today      Protein-calorie malnutrition, severe (HCC)    Albumin 2.4.  I recommended starting daily protein supplement shakes.      Anemia due to stage 4 chronic kidney disease (HCC) - Primary    Hgb 8.3 on the day of hospital discharge.  Received 1 unit PRBCs on 6/7 as hemoglobin decreased to 7.8. -Repeat CBC ordered today      Return in about 4 weeks (around 12/22/2022).   [name], MD

## 2022-11-24 NOTE — Consult Note (Signed)
   [facility] CM Inpatient Consult   11/24/2022  Lyncoln Housler September 18, 1934 [id number]  Primary Care Provider:  Dr. Durwin Nora ([facility])  Patient is currently active with [facility] [[facility]] Care Management for chronic disease management services.  Patient has been engaged by a [facility] LCSW.  Our community based plan of care has focused on disease management and community resource support.   Patient will receive a post hospital call and will be evaluated for assessments and disease process education.    Of note, [facility] Care Management services does not replace or interfere with any services that are needed or arranged by inpatient [facility] care management team.   For additional questions or referrals please contact:  Elliot Cousin, RN, BSN [facility] Liaison [place]   [facility]  Population Health Office Hours MTWF  8:00 am-6:00 pm Off on Thursday [phone] mobile [phone] [Office toll free line][facility] Office Hours are M-F 8:30 - 5 pm 24 hour nurse advise line [phone] Concierge  [email or web address]

## 2022-11-24 NOTE — Patient Instructions (Signed)
It was a pleasure to see you today.  Thank you for giving [place] the opportunity to be involved in your care.  Below is a brief recap of your visit and next steps.  We will plan to see you again in 4 weeks.  Summary Repeat labs ordered today You have been referred to audiology and podiatry Follow up in 4 weeks I recommend taking tums for calcium supplementation

## 2022-11-24 NOTE — Assessment & Plan Note (Signed)
History of CAD.  NSTEMI documented during recent admission, likely demand ischemia in the setting of acute hypoxic respiratory failure.  Evaluated by cardiology.  No additional invasive testing indicated.  She has been discharged on metoprolol tartrate 25 mg twice daily, amlodipine 5 mg daily, Imdur 60 mg daily, ASA/Plavix, and rosuvastatin 20 mg daily.

## 2022-11-24 NOTE — Assessment & Plan Note (Signed)
Treated for COPD exacerbation during hospital admission.  Received IV steroids and antibiotics.  Discharged on prednisone, which was completed yesterday (6/13).  She remains on Combivent and DuoNebs.  Pulmonary exam today is unremarkable.

## 2022-11-24 NOTE — Assessment & Plan Note (Signed)
BP significantly elevated during recent hospital admission with systolics reaching 230s.  Metoprolol and Imdur were added to her antihypertensive regimen.  BP has normalized today.  Suspect flash pulmonary edema was contributing to respiratory distress. -No medication changes today

## 2022-11-27 ENCOUNTER — Telehealth: Payer: Self-pay

## 2022-11-27 DIAGNOSIS — I509 Heart failure, unspecified: Secondary | ICD-10-CM

## 2022-11-27 DIAGNOSIS — I160 Hypertensive urgency: Secondary | ICD-10-CM | POA: Diagnosis not present

## 2022-11-27 DIAGNOSIS — D631 Anemia in chronic kidney disease: Secondary | ICD-10-CM | POA: Diagnosis not present

## 2022-11-27 DIAGNOSIS — J438 Other emphysema: Secondary | ICD-10-CM | POA: Diagnosis not present

## 2022-11-27 DIAGNOSIS — N184 Chronic kidney disease, stage 4 (severe): Secondary | ICD-10-CM | POA: Diagnosis not present

## 2022-11-28 ENCOUNTER — Telehealth: Payer: Self-pay | Admitting: *Deleted

## 2022-11-28 LAB — CMP14+EGFR
ALT: 35 IU/L — ABNORMAL HIGH (ref 0–32)
AST: 34 IU/L (ref 0–40)
Albumin: 3.1 g/dL — ABNORMAL LOW (ref 3.7–4.7)
Alkaline Phosphatase: 48 IU/L (ref 44–121)
BUN/Creatinine Ratio: 32 — ABNORMAL HIGH (ref 12–28)
BUN: 58 mg/dL — ABNORMAL HIGH (ref 8–27)
Bilirubin Total: 0.5 mg/dL (ref 0.0–1.2)
CO2: 28 mmol/L (ref 20–29)
Calcium: 9.2 mg/dL (ref 8.7–10.3)
Chloride: 104 mmol/L (ref 96–106)
Creatinine, Ser: 1.84 mg/dL — ABNORMAL HIGH (ref 0.57–1.00)
Globulin, Total: 4.2 g/dL (ref 1.5–4.5)
Glucose: 98 mg/dL (ref 70–99)
Potassium: 5.5 mmol/L — ABNORMAL HIGH (ref 3.5–5.2)
Sodium: 140 mmol/L (ref 134–144)
Total Protein: 7.3 g/dL (ref 6.0–8.5)
eGFR: 27 mL/min/{1.73_m2} — ABNORMAL LOW (ref >59)

## 2022-11-28 LAB — CBC WITH DIFFERENTIAL/PLATELET
Basophils Absolute: 0 10*3/uL (ref 0.0–0.2)
Basos: 0 %
EOS (ABSOLUTE): 0.1 10*3/uL (ref 0.0–0.4)
Eos: 1 %
Hematocrit: 23.2 % — ABNORMAL LOW (ref 34.0–46.6)
Hemoglobin: 7.5 g/dL — ABNORMAL LOW (ref 11.1–15.9)
Immature Grans (Abs): 0 10*3/uL (ref 0.0–0.1)
Immature Granulocytes: 0 %
Lymphocytes Absolute: 0.9 10*3/uL (ref 0.7–3.1)
Lymphs: 13 %
MCH: 29.9 pg (ref 26.6–33.0)
MCHC: 32.3 g/dL (ref 31.5–35.7)
MCV: 92 fL (ref 79–97)
Monocytes Absolute: 0.6 10*3/uL (ref 0.1–0.9)
Monocytes: 9 %
Neutrophils Absolute: 5.4 10*3/uL (ref 1.4–7.0)
Neutrophils: 77 %
Platelets: 141 10*3/uL — ABNORMAL LOW (ref 150–450)
RBC: 2.51 x10E6/uL — CL (ref 3.77–5.28)
RDW: 12.7 % (ref 11.7–15.4)
WBC: 7 10*3/uL (ref 3.4–10.8)

## 2022-11-28 LAB — MAGNESIUM: Magnesium: 2.4 mg/dL — ABNORMAL HIGH (ref 1.6–2.3)

## 2022-11-28 NOTE — Progress Notes (Signed)
  Care Coordination   Note   11/28/2022 Name: Iwalani Soda MRN: [id number] DOB: 07/23/1934  [name] Loralei Quiros is a 88 y.o. year old female who sees Durwin Nora, Lucina Mellow, MD for primary care. I reached out to Janan Ridge by phone today to offer care coordination services.  Ms. Arami Ridinger was given information about Care Coordination services today including:   The Care Coordination services include support from the care team which includes your Nurse Coordinator, Clinical Social Worker, or Pharmacist.  The Care Coordination team is here to help remove barriers to the health concerns and goals most important to you. Care Coordination services are voluntary, and the patient may decline or stop services at any time by request to their care team member.   Care Coordination Consent Status: Patient agreed to services and verbal consent obtained.   Follow up plan:  Telephone appointment with care coordination team member scheduled for:  12/01/22  Encounter Outcome:  Pt. Scheduled Moore,Yvette will be contact person per patient  [facility] Coordination Care Guide  Direct Dial: [phone]

## 2022-11-29 ENCOUNTER — Inpatient Hospital Stay: Payer: Medicare Other | Attending: Hematology

## 2022-11-29 ENCOUNTER — Other Ambulatory Visit: Payer: Self-pay

## 2022-11-29 ENCOUNTER — Ambulatory Visit (HOSPITAL_COMMUNITY)
Admission: RE | Admit: 2022-11-29 | Discharge: 2022-11-29 | Disposition: A | Discharge status: Home / Self Care | Payer: Medicare Other | Source: Ambulatory Visit | Attending: Hematology | Admitting: Hematology

## 2022-11-29 DIAGNOSIS — R918 Other nonspecific abnormal finding of lung field: Secondary | ICD-10-CM | POA: Diagnosis not present

## 2022-11-29 DIAGNOSIS — Z7982 Long term (current) use of aspirin: Secondary | ICD-10-CM | POA: Diagnosis not present

## 2022-11-29 DIAGNOSIS — I700 Atherosclerosis of aorta: Secondary | ICD-10-CM | POA: Insufficient documentation

## 2022-11-29 DIAGNOSIS — C3490 Malignant neoplasm of unspecified part of unspecified bronchus or lung: Secondary | ICD-10-CM | POA: Diagnosis not present

## 2022-11-29 DIAGNOSIS — D509 Iron deficiency anemia, unspecified: Secondary | ICD-10-CM

## 2022-11-29 DIAGNOSIS — J8410 Pulmonary fibrosis, unspecified: Secondary | ICD-10-CM | POA: Insufficient documentation

## 2022-11-29 DIAGNOSIS — Z556 Problems related to health literacy: Secondary | ICD-10-CM | POA: Diagnosis not present

## 2022-11-29 DIAGNOSIS — D140 Benign neoplasm of middle ear, nasal cavity and accessory sinuses: Secondary | ICD-10-CM | POA: Insufficient documentation

## 2022-11-29 DIAGNOSIS — I2510 Atherosclerotic heart disease of native coronary artery without angina pectoris: Secondary | ICD-10-CM | POA: Diagnosis not present

## 2022-11-29 DIAGNOSIS — N179 Acute kidney failure, unspecified: Secondary | ICD-10-CM | POA: Diagnosis not present

## 2022-11-29 DIAGNOSIS — D631 Anemia in chronic kidney disease: Secondary | ICD-10-CM | POA: Diagnosis not present

## 2022-11-29 DIAGNOSIS — D472 Monoclonal gammopathy: Secondary | ICD-10-CM | POA: Insufficient documentation

## 2022-11-29 DIAGNOSIS — H9193 Unspecified hearing loss, bilateral: Secondary | ICD-10-CM

## 2022-11-29 DIAGNOSIS — J441 Chronic obstructive pulmonary disease with (acute) exacerbation: Secondary | ICD-10-CM | POA: Diagnosis not present

## 2022-11-29 DIAGNOSIS — Z85118 Personal history of other malignant neoplasm of bronchus and lung: Secondary | ICD-10-CM | POA: Diagnosis not present

## 2022-11-29 DIAGNOSIS — J9621 Acute and chronic respiratory failure with hypoxia: Secondary | ICD-10-CM | POA: Diagnosis not present

## 2022-11-29 DIAGNOSIS — Z87440 Personal history of urinary (tract) infections: Secondary | ICD-10-CM | POA: Diagnosis not present

## 2022-11-29 DIAGNOSIS — Z7952 Long term (current) use of systemic steroids: Secondary | ICD-10-CM | POA: Diagnosis not present

## 2022-11-29 DIAGNOSIS — E46 Unspecified protein-calorie malnutrition: Secondary | ICD-10-CM | POA: Diagnosis not present

## 2022-11-29 DIAGNOSIS — Z87891 Personal history of nicotine dependence: Secondary | ICD-10-CM | POA: Diagnosis not present

## 2022-11-29 DIAGNOSIS — R262 Difficulty in walking, not elsewhere classified: Secondary | ICD-10-CM | POA: Diagnosis not present

## 2022-11-29 DIAGNOSIS — J9610 Chronic respiratory failure, unspecified whether with hypoxia or hypercapnia: Secondary | ICD-10-CM | POA: Diagnosis not present

## 2022-11-29 DIAGNOSIS — N189 Chronic kidney disease, unspecified: Secondary | ICD-10-CM | POA: Insufficient documentation

## 2022-11-29 DIAGNOSIS — I5033 Acute on chronic diastolic (congestive) heart failure: Secondary | ICD-10-CM | POA: Diagnosis not present

## 2022-11-29 DIAGNOSIS — N184 Chronic kidney disease, stage 4 (severe): Secondary | ICD-10-CM | POA: Diagnosis not present

## 2022-11-29 DIAGNOSIS — I214 Non-ST elevation (NSTEMI) myocardial infarction: Secondary | ICD-10-CM | POA: Diagnosis not present

## 2022-11-29 DIAGNOSIS — I130 Hypertensive heart and chronic kidney disease with heart failure and stage 1 through stage 4 chronic kidney disease, or unspecified chronic kidney disease: Secondary | ICD-10-CM | POA: Diagnosis not present

## 2022-11-29 DIAGNOSIS — R627 Adult failure to thrive: Secondary | ICD-10-CM | POA: Diagnosis not present

## 2022-11-29 DIAGNOSIS — M6281 Muscle weakness (generalized): Secondary | ICD-10-CM | POA: Diagnosis not present

## 2022-11-29 LAB — COMPREHENSIVE METABOLIC PANEL
ALT: 32 U/L (ref 0–44)
AST: 30 U/L (ref 15–41)
Albumin: 2.8 g/dL — ABNORMAL LOW (ref 3.5–5.0)
Alkaline Phosphatase: 45 U/L (ref 38–126)
Anion gap: 4 — ABNORMAL LOW (ref 5–15)
BUN: 54 mg/dL — ABNORMAL HIGH (ref 8–23)
CO2: 27 mmol/L (ref 22–32)
Calcium: 8.3 mg/dL — ABNORMAL LOW (ref 8.9–10.3)
Chloride: 103 mmol/L (ref 98–111)
Creatinine, Ser: 2.01 mg/dL — ABNORMAL HIGH (ref 0.44–1.00)
GFR, Estimated: 24 mL/min — ABNORMAL LOW (ref >60)
Glucose, Bld: 88 mg/dL (ref 70–99)
Potassium: 4.7 mmol/L (ref 3.5–5.1)
Sodium: 134 mmol/L — ABNORMAL LOW (ref 135–145)
Total Bilirubin: 0.8 mg/dL (ref 0.3–1.2)
Total Protein: 7.6 g/dL (ref 6.5–8.1)

## 2022-11-29 LAB — CBC WITH DIFFERENTIAL/PLATELET
Abs Immature Granulocytes: 0.01 10*3/uL (ref 0.00–0.07)
Basophils Absolute: 0 10*3/uL (ref 0.0–0.1)
Basophils Relative: 0 %
Eosinophils Absolute: 0 10*3/uL (ref 0.0–0.5)
Eosinophils Relative: 0 %
HCT: 24.6 % — ABNORMAL LOW (ref 36.0–46.0)
Hemoglobin: 7.5 g/dL — ABNORMAL LOW (ref 12.0–15.0)
Immature Granulocytes: 0 %
Lymphocytes Relative: 12 %
Lymphs Abs: 0.8 10*3/uL (ref 0.7–4.0)
MCH: 30.2 pg (ref 26.0–34.0)
MCHC: 30.5 g/dL (ref 30.0–36.0)
MCV: 99.2 fL (ref 80.0–100.0)
Monocytes Absolute: 0.5 10*3/uL (ref 0.1–1.0)
Monocytes Relative: 8 %
Neutro Abs: 5.2 10*3/uL (ref 1.7–7.7)
Neutrophils Relative %: 80 %
Platelets: 141 10*3/uL — ABNORMAL LOW (ref 150–400)
RBC: 2.48 MIL/uL — ABNORMAL LOW (ref 3.87–5.11)
RDW: 14.1 % (ref 11.5–15.5)
WBC: 6.5 10*3/uL (ref 4.0–10.5)
nRBC: 0 % (ref 0.0–0.2)

## 2022-11-29 LAB — LACTATE DEHYDROGENASE: LDH: 244 U/L — ABNORMAL HIGH (ref 98–192)

## 2022-11-29 LAB — IRON AND TIBC
Iron: 49 ug/dL (ref 28–170)
Saturation Ratios: 17 % (ref 10.4–31.8)
TIBC: 291 ug/dL (ref 250–450)
UIBC: 242 ug/dL

## 2022-11-29 LAB — VITAMIN B12: Vitamin B-12: 430 pg/mL (ref 180–914)

## 2022-11-29 LAB — FERRITIN: Ferritin: 452 ng/mL — ABNORMAL HIGH (ref 11–307)

## 2022-11-30 DIAGNOSIS — I5033 Acute on chronic diastolic (congestive) heart failure: Secondary | ICD-10-CM | POA: Diagnosis not present

## 2022-11-30 DIAGNOSIS — E46 Unspecified protein-calorie malnutrition: Secondary | ICD-10-CM | POA: Diagnosis not present

## 2022-11-30 DIAGNOSIS — Z556 Problems related to health literacy: Secondary | ICD-10-CM | POA: Diagnosis not present

## 2022-11-30 DIAGNOSIS — R627 Adult failure to thrive: Secondary | ICD-10-CM | POA: Diagnosis not present

## 2022-11-30 DIAGNOSIS — I130 Hypertensive heart and chronic kidney disease with heart failure and stage 1 through stage 4 chronic kidney disease, or unspecified chronic kidney disease: Secondary | ICD-10-CM | POA: Diagnosis not present

## 2022-11-30 DIAGNOSIS — R262 Difficulty in walking, not elsewhere classified: Secondary | ICD-10-CM | POA: Diagnosis not present

## 2022-11-30 DIAGNOSIS — M6281 Muscle weakness (generalized): Secondary | ICD-10-CM | POA: Diagnosis not present

## 2022-11-30 DIAGNOSIS — J9621 Acute and chronic respiratory failure with hypoxia: Secondary | ICD-10-CM | POA: Diagnosis not present

## 2022-11-30 DIAGNOSIS — Z87440 Personal history of urinary (tract) infections: Secondary | ICD-10-CM | POA: Diagnosis not present

## 2022-11-30 DIAGNOSIS — Z7982 Long term (current) use of aspirin: Secondary | ICD-10-CM | POA: Diagnosis not present

## 2022-11-30 DIAGNOSIS — Z85118 Personal history of other malignant neoplasm of bronchus and lung: Secondary | ICD-10-CM | POA: Diagnosis not present

## 2022-11-30 DIAGNOSIS — I2510 Atherosclerotic heart disease of native coronary artery without angina pectoris: Secondary | ICD-10-CM | POA: Diagnosis not present

## 2022-11-30 DIAGNOSIS — D631 Anemia in chronic kidney disease: Secondary | ICD-10-CM | POA: Diagnosis not present

## 2022-11-30 DIAGNOSIS — N184 Chronic kidney disease, stage 4 (severe): Secondary | ICD-10-CM | POA: Diagnosis not present

## 2022-11-30 DIAGNOSIS — J441 Chronic obstructive pulmonary disease with (acute) exacerbation: Secondary | ICD-10-CM | POA: Diagnosis not present

## 2022-11-30 DIAGNOSIS — Z7952 Long term (current) use of systemic steroids: Secondary | ICD-10-CM | POA: Diagnosis not present

## 2022-11-30 DIAGNOSIS — J9610 Chronic respiratory failure, unspecified whether with hypoxia or hypercapnia: Secondary | ICD-10-CM | POA: Diagnosis not present

## 2022-11-30 DIAGNOSIS — I214 Non-ST elevation (NSTEMI) myocardial infarction: Secondary | ICD-10-CM | POA: Diagnosis not present

## 2022-11-30 DIAGNOSIS — N179 Acute kidney failure, unspecified: Secondary | ICD-10-CM | POA: Diagnosis not present

## 2022-11-30 DIAGNOSIS — Z87891 Personal history of nicotine dependence: Secondary | ICD-10-CM | POA: Diagnosis not present

## 2022-11-30 LAB — KAPPA/LAMBDA LIGHT CHAINS
Kappa free light chain: 53.8 mg/L — ABNORMAL HIGH (ref 3.3–19.4)
Kappa, lambda light chain ratio: 2.09 — ABNORMAL HIGH (ref 0.26–1.65)
Lambda free light chains: 25.7 mg/L (ref 5.7–26.3)

## 2022-12-01 ENCOUNTER — Encounter: Payer: Self-pay | Admitting: *Deleted

## 2022-12-01 ENCOUNTER — Emergency Department (HOSPITAL_COMMUNITY)
Admission: EM | Admit: 2022-12-01 | Discharge: 2022-12-01 | Disposition: A | Discharge status: Home / Self Care | Payer: Medicare Other | Attending: Emergency Medicine | Admitting: Emergency Medicine

## 2022-12-01 ENCOUNTER — Encounter (HOSPITAL_COMMUNITY)
Admission: RE | Admit: 2022-12-01 | Discharge: 2022-12-01 | Disposition: A | Discharge status: Home / Self Care | Payer: Medicare Other | Source: Ambulatory Visit | Attending: Internal Medicine | Admitting: Internal Medicine

## 2022-12-01 ENCOUNTER — Other Ambulatory Visit: Payer: Self-pay

## 2022-12-01 ENCOUNTER — Ambulatory Visit: Payer: Self-pay | Admitting: *Deleted

## 2022-12-01 ENCOUNTER — Emergency Department (HOSPITAL_COMMUNITY): Payer: Medicare Other

## 2022-12-01 ENCOUNTER — Encounter (HOSPITAL_COMMUNITY): Payer: Self-pay

## 2022-12-01 VITALS — BP 178/63 | HR 64 | Temp 98.4°F | Resp 20

## 2022-12-01 DIAGNOSIS — R6889 Other general symptoms and signs: Secondary | ICD-10-CM | POA: Diagnosis not present

## 2022-12-01 DIAGNOSIS — I129 Hypertensive chronic kidney disease with stage 1 through stage 4 chronic kidney disease, or unspecified chronic kidney disease: Secondary | ICD-10-CM | POA: Diagnosis not present

## 2022-12-01 DIAGNOSIS — D500 Iron deficiency anemia secondary to blood loss (chronic): Secondary | ICD-10-CM

## 2022-12-01 DIAGNOSIS — D649 Anemia, unspecified: Secondary | ICD-10-CM | POA: Diagnosis not present

## 2022-12-01 DIAGNOSIS — D631 Anemia in chronic kidney disease: Secondary | ICD-10-CM

## 2022-12-01 DIAGNOSIS — N189 Chronic kidney disease, unspecified: Secondary | ICD-10-CM | POA: Diagnosis not present

## 2022-12-01 DIAGNOSIS — R0789 Other chest pain: Secondary | ICD-10-CM | POA: Diagnosis not present

## 2022-12-01 DIAGNOSIS — J449 Chronic obstructive pulmonary disease, unspecified: Secondary | ICD-10-CM | POA: Insufficient documentation

## 2022-12-01 DIAGNOSIS — Z7902 Long term (current) use of antithrombotics/antiplatelets: Secondary | ICD-10-CM | POA: Insufficient documentation

## 2022-12-01 DIAGNOSIS — Z79899 Other long term (current) drug therapy: Secondary | ICD-10-CM | POA: Insufficient documentation

## 2022-12-01 DIAGNOSIS — Z743 Need for continuous supervision: Secondary | ICD-10-CM | POA: Diagnosis not present

## 2022-12-01 DIAGNOSIS — Z7982 Long term (current) use of aspirin: Secondary | ICD-10-CM | POA: Diagnosis not present

## 2022-12-01 DIAGNOSIS — I209 Angina pectoris, unspecified: Secondary | ICD-10-CM | POA: Insufficient documentation

## 2022-12-01 DIAGNOSIS — D509 Iron deficiency anemia, unspecified: Secondary | ICD-10-CM

## 2022-12-01 DIAGNOSIS — R918 Other nonspecific abnormal finding of lung field: Secondary | ICD-10-CM | POA: Diagnosis not present

## 2022-12-01 DIAGNOSIS — I2089 Other forms of angina pectoris: Secondary | ICD-10-CM

## 2022-12-01 LAB — TROPONIN I (HIGH SENSITIVITY)
Troponin I (High Sensitivity): 37 ng/L — ABNORMAL HIGH (ref <18)
Troponin I (High Sensitivity): 42 ng/L — ABNORMAL HIGH (ref <18)

## 2022-12-01 LAB — BPAM RBC

## 2022-12-01 LAB — BASIC METABOLIC PANEL
Anion gap: 8 (ref 5–15)
BUN: 54 mg/dL — ABNORMAL HIGH (ref 8–23)
CO2: 25 mmol/L (ref 22–32)
Calcium: 8.3 mg/dL — ABNORMAL LOW (ref 8.9–10.3)
Chloride: 104 mmol/L (ref 98–111)
Creatinine, Ser: 1.9 mg/dL — ABNORMAL HIGH (ref 0.44–1.00)
GFR, Estimated: 26 mL/min — ABNORMAL LOW (ref >60)
Glucose, Bld: 136 mg/dL — ABNORMAL HIGH (ref 70–99)
Potassium: 4 mmol/L (ref 3.5–5.1)
Sodium: 137 mmol/L (ref 135–145)

## 2022-12-01 LAB — CBC
HCT: 24.2 % — ABNORMAL LOW (ref 36.0–46.0)
Hemoglobin: 7.8 g/dL — ABNORMAL LOW (ref 12.0–15.0)
MCH: 31.5 pg (ref 26.0–34.0)
MCHC: 32.2 g/dL (ref 30.0–36.0)
MCV: 97.6 fL (ref 80.0–100.0)
Platelets: 138 10*3/uL — ABNORMAL LOW (ref 150–400)
RBC: 2.48 MIL/uL — ABNORMAL LOW (ref 3.87–5.11)
RDW: 14.1 % (ref 11.5–15.5)
WBC: 7.5 10*3/uL (ref 4.0–10.5)
nRBC: 0 % (ref 0.0–0.2)

## 2022-12-01 LAB — PREPARE RBC (CROSSMATCH)

## 2022-12-01 LAB — TYPE AND SCREEN: Antibody Screen: NEGATIVE

## 2022-12-01 MED ORDER — SODIUM CHLORIDE 0.9% IV SOLUTION: Freq: Once | INTRAVENOUS | Status: AC

## 2022-12-01 MED ORDER — ASPIRIN 81 MG PO CHEW
324.0000 mg | CHEWABLE_TABLET | Freq: Once | ORAL | Status: AC
  Administered 2022-12-01: 324 mg via ORAL
  Filled 2022-12-01: qty 4

## 2022-12-01 MED ORDER — NITROGLYCERIN 2 % TD OINT
1.0000 [in_us] | TOPICAL_OINTMENT | Freq: Once | TRANSDERMAL | Status: AC
  Administered 2022-12-01: 1 [in_us] via TOPICAL
  Filled 2022-12-01: qty 1

## 2022-12-01 NOTE — Patient Instructions (Addendum)
Visit Information  Thank you for taking time to visit with me today. Please don't hesitate to contact me if I can be of assistance to you.   Following are the goals we discussed today:   Goals Addressed             This Visit's Progress    [facility] care coordination services (MI, anemia)   Not on track    Interventions Today    Flowsheet Row Most Recent Value  Chronic Disease   Chronic disease during today's visit Chronic Obstructive Pulmonary Disease (COPD), Diabetes, Other, Hypertension (HTN), Chronic Kidney Disease/End Stage Renal Disease (ESRD)  General Interventions   General Interventions Discussed/Reviewed General Interventions Discussed, Labs, Durable Medical Equipment (DME), Communication with  Labs --  [iron levels/anemia]  Durable Medical Equipment (DME) Other, Oxygen  [pulse oximeter, no Blood pressure cuff ([employer] OTC program)]  Communication with Social Work  Production manager with [employer] SW Mardene Celeste S]  [employer], Other  [encouraged my chart access]  Education Interventions   Education Provided --  [encouraged completion of designated person release forms for patient, palliative care services for goals of care services, updated on anticipated future blood infusions to maintain anemia lab values to assist with respiratory and cardiac symptoms]  Provided Verbal Education On Labs, Air traffic controller, [employer], Development worker, community  [employer] application process, infusions for iron deficiency anemia/labs, Long term care insurance coverage, Cytogeneticist adminstration benefits, county Multimedia programmer caregiver voucher/grant services, CAP (community adult programs), palliative care]  Labs Reviewed --  [anemia labs]  [employer], Other  [encouraged my chart access]  Mental Health Interventions   Mental Health Discussed/Reviewed Mental Health Discussed, Coping Strategies  Pharmacy Interventions   Pharmacy Dicussed/Reviewed Pharmacy Topics Discussed, Affording  Medications  Safety Interventions   Safety Discussed/Reviewed Safety Discussed, Home Safety  Home Safety Assistive Devices              Our next appointment is by telephone on 12/05/22 at 2 pm  Please call the care guide team at [phone] if you need to cancel or reschedule your appointment.   If you are experiencing a Mental Health or Behavioral Health Crisis or need someone to talk to, please call the Suicide and Crisis Lifeline: 988 call the [place] National Suicide Prevention Lifeline: 631-592-1261 or TTY: (601)454-9988 TTY 212-281-1832) to talk to a trained counselor call 1-800-273-TALK (toll free, 24 hour hotline) call the [facility]: 669-082-1864 call 911   The patient verbalized understanding of instructions, educational materials, and care plan provided today and DECLINED offer to receive copy of patient instructions, educational materials, and care plan.   The patient has been provided with contact information for the care management team and has been advised to call with any health related questions or concerns.    Latronda Spink L. Noelle Penner, RN, BSN, CCM [facility] Care Management Community Coordinator Office number [phone]

## 2022-12-01 NOTE — ED Provider Notes (Signed)
[place] EMERGENCY DEPARTMENT AT [facility] Provider Note   CSN: [id number] Arrival date & time: 12/01/22  0144     History  Chief Complaint  Patient presents with   Chest Pain    Lisa [name] is a 88 y.o. female.  HPI     This is an 88 year old female who presents with chest discomfort.  Patient was recently admitted the hospital for respiratory distress.  At that time she had an NSTEMI with a troponin of 12,000.  She was evaluated by cardiology and palliative care.  Given goals of care, she elected not to have cardiac catheterization.  She is DNR/DNI.  She returns tonight because she woke up with chest discomfort.  She describes it as chest tightness.  She took 1 nitro prior to EMS arrival with some relief of her symptoms.  She not had any recent fevers or cough.  She states that her discomfort has mostly resolved at this point.  Home Medications Prior to Admission medications   Medication Sig Start Date End Date Taking? Authorizing Provider  amLODipine (NORVASC) 5 MG tablet Take 1 tablet (5 mg total) by mouth daily. 11/21/22   Johnson, Clanford L, MD  aspirin 81 MG EC tablet Take 81 mg by mouth in the morning.    [provider]  calcium carbonate (OS-CAL - DOSED IN MG OF ELEMENTAL CALCIUM) 1250 (500 Ca) MG tablet Take 1 tablet by mouth daily with breakfast.    [provider]  Cholecalciferol (VITAMIN D3) 125 MCG (5000 UT) CAPS Take 5,000 Units by mouth daily.    [provider]  clopidogrel (PLAVIX) 75 MG tablet Take 1 tablet (75 mg total) by mouth daily. 11/22/22   Johnson, Clanford L, MD  feeding supplement (ENSURE ENLIVE / ENSURE PLUS) LIQD Take 237 mLs by mouth 2 (two) times daily between meals. 12/31/21   Sherryll Burger, Pratik D, DO  furosemide (LASIX) 20 MG tablet Take 1 tablet (20 mg total) by mouth daily as needed for edema (or weight gain 3 pounds or more). 11/21/22   Johnson, Clanford L, MD  Ipratropium-Albuterol (COMBIVENT RESPIMAT)  20-100 MCG/ACT AERS respimat Inhale 1 puff into the lungs every 6 (six) hours as needed for wheezing or shortness of breath. 12/31/21   Sherryll Burger, Pratik D, DO  ipratropium-albuterol (DUONEB) 0.5-2.5 (3) MG/3ML SOLN Take 3 mLs by nebulization every 4 (four) hours as needed (wheezing/shortness of breath).    [provider]  Iron, Ferrous Sulfate, 325 (65 Fe) MG TABS Take by mouth.    [provider]  isosorbide mononitrate (IMDUR) 60 MG 24 hr tablet Take 1 tablet (60 mg total) by mouth daily. 11/21/22 12/21/22  Johnson, Clanford L, MD  metoprolol tartrate (LOPRESSOR) 25 MG tablet Take 1 tablet (25 mg total) by mouth 2 (two) times daily. 11/21/22   Cleora Fleet, MD  Multiple Vitamin (MULTIVITAMIN WITH MINERALS) TABS tablet Take 1 tablet by mouth daily. 11/22/22   Johnson, Clanford L, MD  nitroGLYCERIN (NITROSTAT) 0.4 MG SL tablet Place 1 tablet (0.4 mg total) under the tongue every 5 (five) minutes as needed for chest pain. 09/06/21 11/24/22  Pokhrel, Rebekah Chesterfield, MD  ondansetron (ZOFRAN) 4 MG tablet Take 1 tablet (4 mg total) by mouth every 6 (six) hours as needed for nausea. 09/06/21   Pokhrel, Rebekah Chesterfield, MD  rosuvastatin (CRESTOR) 20 MG tablet Take 1 tablet (20 mg total) by mouth daily at 6 PM. 11/21/22   Cleora Fleet, MD  Allergies    Patient has no known allergies.    Review of Systems   Review of Systems  Constitutional:  Negative for fever.  Respiratory:  Positive for chest tightness. Negative for shortness of breath.   Cardiovascular:  Negative for leg swelling.  All other systems reviewed and are negative.   Physical Exam Updated Vital Signs BP (!) 158/63   Pulse 68   Temp 98.3 F (36.8 C) (Oral)   Resp 15   Ht 1.702 m (5\' 7" )   Wt 49 kg   SpO2 96%   BMI 16.92 kg/m  Physical Exam Vitals and nursing note reviewed.  Constitutional:      Comments: Elderly, chronically ill-appearing, no acute distress, thin, frail  HENT:     Head: Normocephalic and atraumatic.   Eyes:     Pupils: Pupils are equal, round, and reactive to light.  Cardiovascular:     Rate and Rhythm: Normal rate and regular rhythm.     Heart sounds: Normal heart sounds.  Pulmonary:     Effort: Pulmonary effort is normal. No respiratory distress.     Breath sounds: Normal breath sounds. No wheezing.  Abdominal:     General: Bowel sounds are normal.     Palpations: Abdomen is soft.  Musculoskeletal:     Cervical back: Neck supple.     Right lower leg: No edema.     Left lower leg: No edema.  Skin:    General: Skin is warm and dry.  Neurological:     General: No focal deficit present.     Mental Status: She is alert and oriented to person, place, and time.  Psychiatric:        Mood and Affect: Mood normal.     ED Results / Procedures / Treatments   Labs (all labs ordered are listed, but only abnormal results are displayed) Labs Reviewed  BASIC METABOLIC PANEL - Abnormal; Notable for the following components:      Result Value   Glucose, Bld 136 (*)    BUN 54 (*)    Creatinine, Ser 1.90 (*)    Calcium 8.3 (*)    GFR, Estimated 26 (*)    All other components within normal limits  CBC - Abnormal; Notable for the following components:   RBC 2.48 (*)    Hemoglobin 7.8 (*)    HCT 24.2 (*)    Platelets 138 (*)    All other components within normal limits  TROPONIN I (HIGH SENSITIVITY) - Abnormal; Notable for the following components:   Troponin I (High Sensitivity) 37 (*)    All other components within normal limits  TROPONIN I (HIGH SENSITIVITY) - Abnormal; Notable for the following components:   Troponin I (High Sensitivity) 42 (*)    All other components within normal limits    EKG EKG Interpretation  Date/Time:  Friday December 01 2022 01:48:08 EDT Ventricular Rate:  77 PR Interval:  112 QRS Duration: 94 QT Interval:  359 QTC Calculation: 407 R Axis:   76 Text Interpretation: Sinus rhythm Borderline short PR interval Probable LVH with secondary repol abnrm  Repol abnrm, severe global ischemia (LM/MVD) Similar morphology to prior EKG Confirmed by Ross Marcus ([id number]) on 12/01/2022 1:50:42 AM  Radiology No results found.  Procedures Procedures    Medications Ordered in ED Medications  nitroGLYCERIN (NITROGLYN) 2 % ointment 1 inch (1 inch Topical Given 12/01/22 0208)  aspirin chewable tablet 324 mg (324 mg Oral Given 12/01/22 0208)  ED Course/ Medical Decision Making/ A&P                             Medical Decision Making Amount and/or Complexity of Data Reviewed Labs: ordered.  Risk OTC drugs. Prescription drug management.   This patient presents to the ED for concern of chest pain, this involves an extensive number of treatment options, and is a complaint that carries with it a high risk of complications and morbidity.  I considered the following differential and admission for this acute, potentially life threatening condition.  The differential diagnosis includes ACS including angina, PE, pneumothorax, pneumonia  MDM:    This is an 88 year old female who presents with chest discomfort.  Improved with nitroglycerin at home.  Recent NSTEMI but did not get a cardiac catheterization secondary to goals of care.  She is on Imdur at home.  She is mostly comfortable at this time.  EKG is nonspecific and without acute ischemic changes.  Initial troponin is 37.  This is significantly improved from her recent hospitalization.  Labs are otherwise largely at her baseline and creatinine creatinine of 1.9 hemoglobin of 7.8.  Repeat troponin is 42.  Patient has remained comfortable in the emergency department.  I confirmed with the patient and her daughter that her goals of care remain the same.  She is DNR/DNI.  She prefers not to proceed with any invasive cardiac testing.  She is currently on 60 mg of Imdur.  She may benefit from an increase.  Recommend that she follow-up with cardiology.  Family states that she is due to have a scheduled blood  transfusion later this morning and is requesting that she be observed until that time.  Feel this is reasonable.  (Labs, imaging, consults)  Labs: I Ordered, and personally interpreted labs.  The pertinent results include: CBC, BMP, troponin x 2  Imaging Studies ordered: I ordered imaging studies including chest x-ray I independently visualized and interpreted imaging. I agree with the radiologist interpretation  Additional history obtained from chart review, family at bedside.  External records from outside source obtained and reviewed including discharge summary  Cardiac Monitoring: The patient was maintained on a cardiac monitor.  If on the cardiac monitor, I personally viewed and interpreted the cardiac monitored which showed an underlying rhythm of: Sinus rhythm  Reevaluation: After the interventions noted above, I reevaluated the patient and found that they have :improved  Social Determinants of Health:  lives independently, elderly, DNR  Disposition: Discharge, delay secondary to scheduled outpatient blood transfusion  Co morbidities that complicate the patient evaluation  Past Medical History:  Diagnosis Date   Anemia    Carotid artery stenosis    CKD (chronic kidney disease)    COPD (chronic obstructive pulmonary disease) (HCC)    Dyspnea    Due to COPD per patient   Goiter diffuse, nontoxic    Per patient   Hepatitis C    History of radiation therapy    Left lung-10/10/21-10/17/21- Dr. Antony Blackbird   Hypertension    Monoclonal gammopathy of unknown significance (MGUS)    Pyelonephritis 04/06/2014   UTI     Medicines Meds ordered this encounter  Medications   nitroGLYCERIN (NITROGLYN) 2 % ointment 1 inch   aspirin chewable tablet 324 mg    I have reviewed the patients home medicines and have made adjustments as needed  Problem List / ED Course: Problem List Items Addressed This Visit  None Visit Diagnoses     Angina at rest    -  Primary    Relevant Medications   nitroGLYCERIN (NITROGLYN) 2 % ointment 1 inch (Completed)   aspirin chewable tablet 324 mg (Completed)   Chronic anemia                       Final Clinical Impression(s) / ED Diagnoses Final diagnoses:  Angina at rest  Chronic anemia    Rx / DC Orders ED Discharge Orders     None         Shon Baton, MD 12/01/22 [phone]

## 2022-12-01 NOTE — Progress Notes (Signed)
Diagnosis: Acute Anemia, Anemia in Chronic Kidney Disease  Provider:  Christel Mormon MD  Procedure: IV Infusion  IV Type: Peripheral, IV Location: L Forearm  PRBCs, Dose: 1 Unit  Infusion Start Time: 1017  Infusion Stop Time: 1152  Post Infusion IV Care: Observation period completed and Peripheral IV Discontinued  Discharge: Condition: Good, Destination: Home . AVS Provided  Performed by:  Marin Shutter, RN

## 2022-12-01 NOTE — ED Notes (Signed)
Pt d/c to outpatient procedure.

## 2022-12-01 NOTE — Discharge Instructions (Signed)
Call your cardiologist to discuss increasing IMDUR.  Go directly for your blood transfusion.

## 2022-12-01 NOTE — ED Triage Notes (Addendum)
Pt from home BIB [facility] for CP that woke her out of her sleep, pt took 1nitro prior to EMS arrival which relieved some of her chest tightness that is associated w/SOB. Pt w/recent admission for NSTEMI, trop 12,379, pt had refused heart catheterization at that time and dx w/ARDS & CHF. Pt alert on arrival HTN noted.   EMS reports vitals  168/72  HR 82  RR12  98 RA  138 cbg

## 2022-12-01 NOTE — Patient Outreach (Addendum)
Care Coordination   Initial Visit Note   12/01/2022 Name: Lisa [name] MRN: [id number] DOB: 1935-02-02  Lisa [name] is a 88 y.o. year old female who sees Lisa [name], Lisa Mellow, MD for primary care. I spoke with Lisa [name], niece of  Lisa [name] by phone today.  What matters to the patients health and wellness today?  Recent admission 11/15/22- 11/21/22 for NSTEMI and respiratory symptoms caused by MI & acute heart failure, recent initiation of blood transfusions for anemia and level of care needs (lives alone) At the time of initial outreach she is at [name] ED for early morning 12/01/22 complaints of chest pain She was discharged to outpatient for blood transfusion   Her niece is present with her   Patient per niece does not want to leave her home and lives alone The family have been caring for the patient and her brother, Lisa [name]'s father in his 46's) [name] has been consulted already- active- since 10/24/22 Placed on aging on council list for county voucher/grant program/ meals on wheels by niece in 2023 [facility] SW has already assisted with meals on wheels, Adult day centers, [place] family caregiver support program, in home and respite services palliative care services to start in July 2024  With [facility] (OT, PT, aide) the patient continues to not have control of her limbs- tremors  Voiced understanding of Research scientist (life sciences) resources  durable medical equipment (DME) No blood pressure cuff, Has pulse oximeter   Goals Addressed             This Visit's Progress    [facility] care coordination services (MI, anemia)   Not on track    Interventions Today    Flowsheet Row Most Recent Value  Chronic Disease   Chronic disease during today's visit Chronic Obstructive Pulmonary Disease (COPD), Diabetes, Other, Hypertension (HTN), Chronic Kidney Disease/End Stage Renal Disease (ESRD)  General Interventions   General Interventions  Discussed/Reviewed General Interventions Discussed, Labs, Durable Medical Equipment (DME), Communication with  Labs --  [iron levels/anemia]  Durable Medical Equipment (DME) Other, Oxygen  [pulse oximeter, no Blood pressure cuff ([employer] OTC program)]  Communication with Social Work  Production manager with [facility] [address] [place] [address]]  [employer], Other  [encouraged my chart access]  Education Interventions   Education Provided --  [encouraged completion of designated person release forms for patient, palliative care services for goals of care services, updated on anticipated future blood infusions to maintain anemia lab values to assist with respiratory and cardiac symptoms]  Provided Verbal Education On Labs, Air traffic controller, [employer], Development worker, community  [employer] application process, infusions for iron deficiency anemia/labs, Long term care insurance coverage, [employer] adminstration benefits, county Multimedia programmer caregiver voucher/grant services, CAP (community adult programs), palliative care]  Labs Reviewed --  [anemia labs]  [employer], Other  [encouraged my chart access]  Mental Health Interventions   Mental Health Discussed/Reviewed Mental Health Discussed, Coping Strategies  Pharmacy Interventions   Pharmacy Dicussed/Reviewed Pharmacy Topics Discussed, Affording Medications  Safety Interventions   Safety Discussed/Reviewed Safety Discussed, Home Safety  Home Safety Assistive Devices              SDOH assessments and interventions completed:  Yes  SDOH Interventions Today    Flowsheet Row Most Recent Value  SDOH Interventions   Food Insecurity Interventions Intervention Not Indicated  Housing Interventions Intervention Not Indicated  Transportation Interventions Intervention Not Indicated  Utilities Interventions Intervention  Not Indicated  Stress Interventions Intervention Not Indicated  Social Connections Interventions Intervention Not Indicated   [support of brother adn niece &other famiy]        Care Coordination Interventions:  Yes, provided   Follow up plan: Follow up call scheduled for 12/05/22    Encounter Outcome:  Pt. Visit Completed   Kimble Delaurentis L. Noelle Penner, RN, BSN, CCM [facility] Care Management Community Coordinator Office number [phone]

## 2022-12-02 LAB — BPAM RBC
Blood Product Expiration Date: 202407272359
ISSUE DATE / TIME: 202406211008
Unit Type and Rh: 5100

## 2022-12-02 LAB — TYPE AND SCREEN
ABO/RH(D): O POS
Unit division: 0

## 2022-12-04 ENCOUNTER — Telehealth: Payer: Self-pay | Admitting: Internal Medicine

## 2022-12-04 LAB — PROTEIN ELECTROPHORESIS, SERUM
A/G Ratio: 0.8 (ref 0.7–1.7)
Albumin ELP: 3.1 g/dL (ref 2.9–4.4)
Alpha-1-Globulin: 0.3 g/dL (ref 0.0–0.4)
Alpha-2-Globulin: 0.5 g/dL (ref 0.4–1.0)
Beta Globulin: 0.7 g/dL (ref 0.7–1.3)
Gamma Globulin: 2.6 g/dL — ABNORMAL HIGH (ref 0.4–1.8)
Globulin, Total: 4.1 g/dL — ABNORMAL HIGH (ref 2.2–3.9)
M-Spike, %: 2.4 g/dL — ABNORMAL HIGH
Total Protein ELP: 7.2 g/dL (ref 6.0–8.5)

## 2022-12-04 NOTE — Telephone Encounter (Signed)
Gave verbal orders. 

## 2022-12-04 NOTE — Telephone Encounter (Signed)
Lisa [name] called [facility] have social worker added on for some needs need verbal order to add a Child psychotherapist [phone]

## 2022-12-05 ENCOUNTER — Inpatient Hospital Stay: Payer: Medicare Other

## 2022-12-05 ENCOUNTER — Ambulatory Visit: Payer: Self-pay | Admitting: *Deleted

## 2022-12-05 ENCOUNTER — Inpatient Hospital Stay (HOSPITAL_BASED_OUTPATIENT_CLINIC_OR_DEPARTMENT_OTHER): Payer: Medicare Other | Admitting: Oncology

## 2022-12-05 VITALS — BP 168/67 | HR 68 | Temp 96.7°F | Resp 18

## 2022-12-05 DIAGNOSIS — D509 Iron deficiency anemia, unspecified: Secondary | ICD-10-CM

## 2022-12-05 DIAGNOSIS — R918 Other nonspecific abnormal finding of lung field: Secondary | ICD-10-CM

## 2022-12-05 DIAGNOSIS — D631 Anemia in chronic kidney disease: Secondary | ICD-10-CM | POA: Diagnosis not present

## 2022-12-05 DIAGNOSIS — N189 Chronic kidney disease, unspecified: Secondary | ICD-10-CM | POA: Diagnosis not present

## 2022-12-05 DIAGNOSIS — D472 Monoclonal gammopathy: Secondary | ICD-10-CM | POA: Diagnosis not present

## 2022-12-05 LAB — CBC WITH DIFFERENTIAL/PLATELET
Abs Immature Granulocytes: 0.02 10*3/uL (ref 0.00–0.07)
Basophils Absolute: 0 10*3/uL (ref 0.0–0.1)
Basophils Relative: 0 %
Eosinophils Absolute: 0 10*3/uL (ref 0.0–0.5)
Eosinophils Relative: 1 %
HCT: 30.8 % — ABNORMAL LOW (ref 36.0–46.0)
Hemoglobin: 9.9 g/dL — ABNORMAL LOW (ref 12.0–15.0)
Immature Granulocytes: 1 %
Lymphocytes Relative: 14 %
Lymphs Abs: 0.6 10*3/uL — ABNORMAL LOW (ref 0.7–4.0)
MCH: 30.8 pg (ref 26.0–34.0)
MCHC: 32.1 g/dL (ref 30.0–36.0)
MCV: 96 fL (ref 80.0–100.0)
Monocytes Absolute: 0.3 10*3/uL (ref 0.1–1.0)
Monocytes Relative: 6 %
Neutro Abs: 3.4 10*3/uL (ref 1.7–7.7)
Neutrophils Relative %: 78 %
Platelets: 117 10*3/uL — ABNORMAL LOW (ref 150–400)
RBC: 3.21 MIL/uL — ABNORMAL LOW (ref 3.87–5.11)
RDW: 13.6 % (ref 11.5–15.5)
WBC: 4.3 10*3/uL (ref 4.0–10.5)
nRBC: 0 % (ref 0.0–0.2)

## 2022-12-05 MED ORDER — EPOETIN ALFA-EPBX 10000 UNIT/ML IJ SOLN
10000.0000 [IU] | Freq: Once | INTRAMUSCULAR | Status: AC
  Filled 2022-12-05: qty 1

## 2022-12-05 NOTE — Patient Instructions (Signed)
Visit Information  Thank you for taking time to visit with me today. Please don't hesitate to contact me if I can be of assistance to you.   Following are the goals we discussed today:   Goals Addressed             This Visit's Progress    [facility] care coordination services (MI, anemia)       Interventions Today    Flowsheet Row Most Recent Value  Chronic Disease   Chronic disease during today's visit Other  General Interventions   General Interventions Discussed/Reviewed General Interventions Reviewed, Doctor Visits  Doctor Visits Discussed/Reviewed Doctor Visits Discussed, PCP, Specialist  PCP/Specialist Visits Compliance with follow-up visit  Mental Health Interventions   Mental Health Discussed/Reviewed Mental Health Reviewed, Coping Strategies              Our next appointment is by telephone on 12/20/22 at 315 pm  Please call the care guide team at [phone] if you need to cancel or reschedule your appointment.   If you are experiencing a Mental Health or Behavioral Health Crisis or need someone to talk to, please call the Suicide and Crisis Lifeline: 988 call the [place] National Suicide Prevention Lifeline: 909-341-4450 or TTY: 626-333-4856 TTY (713)635-2966) to talk to a trained counselor call 1-800-273-TALK (toll free, 24 hour hotline) call the [facility]: [phone] call 911   The patient verbalized understanding of instructions, educational materials, and care plan provided today and DECLINED offer to receive copy of patient instructions, educational materials, and care plan.   The patient has been provided with contact information for the care management team and has been advised to call with any health related questions or concerns.   Cadience Bradfield L. Noelle Penner, RN, BSN, CCM [facility] Care Management Community Coordinator Office number [phone]

## 2022-12-05 NOTE — Progress Notes (Signed)
[name] [name], [place] [address]   CLINIC:  Medical Oncology/Hematology  PCP:  [name], MD [address] / [place] [address] [phone]   REASON FOR VISIT:  Follow-up for MGUS and left lung cancer  PRIOR THERAPY: none  CURRENT THERAPY: suveillance  BRIEF ONCOLOGIC HISTORY:  Oncology History   No history exists.    CANCER STAGING:  Cancer Staging  Adenocarcinoma of left lung [facility]) Staging form: Lung, AJCC 8th Edition - Clinical stage from 09/11/2021: cT1, cN0, cM0 - Unsigned   INTERVAL HISTORY:  [name], a 88 y.o. female, seen for follow-up of MGUS, anemia.   She was last evaluated in clinic on 06/14/2022 by Dr. Ellin Saba for follow-up. She was hospitalized from 11/15/22-11/21/2022 for acute respiratory failure with hypoxia, anemia, NSTEMI, COPD with exacerbation, malnutrition, stage IV CKD, acute heart failure and hypertensive urgency.  She was started on metoprolol 25 mg and Imdur 60 mg along with amlodipine 5 mg.  She was given IV Lasix for heart failure.  MI was treated medically without cardiac cath due to age.  She was treated with IV heparin, 81 mg aspirin and Plavix was added by her cardiologist. She received 1 unit of blood.  Hemoglobin at discharge was 8.3 and has slowly declined over the next 10 days to 7.5.  She received a second blood transfusion on 11/29/2022.  Since she was discharged from the hospital she is slowly recovering.  She presents today with her niece and her husband.  Overall she feels well.  Energy levels are improving are is 50% today and appetite is 75%.  She denies any pain.  She has some shortness of breath and some right lower extremity swelling.   REVIEW OF SYSTEMS:  Review of Systems  Constitutional:  Positive for fatigue.  Respiratory:  Positive for shortness of breath.   Cardiovascular:  Positive for leg swelling.  Skin:  Positive for rash.    PAST MEDICAL/SURGICAL  HISTORY:  Past Medical History:  Diagnosis Date   Anemia    Carotid artery stenosis    CKD (chronic kidney disease)    COPD (chronic obstructive pulmonary disease) (HCC)    Dyspnea    Due to COPD per patient   Goiter diffuse, nontoxic    Per patient   Hepatitis C    History of radiation therapy    Left lung-10/10/21-10/17/21- Dr. Antony Blackbird   Hypertension    Monoclonal gammopathy of unknown significance (MGUS)    Pyelonephritis 04/06/2014   UTI   Past Surgical History:  Procedure Laterality Date   ABDOMINAL HYSTERECTOMY     CHOLECYSTECTOMY N/A 09/17/2018   Procedure: LAPAROSCOPIC CHOLECYSTECTOMY;  Surgeon: Lucretia Roers, MD;  Location: [facility] ORS;  Service: General;  Laterality: N/A;   FUDUCIAL PLACEMENT Left 09/01/2021   Procedure: PLACEMENT OF FIDUCIAL MARKERS TIMES FOUR;  Surgeon: Loreli Slot, MD;  Location: [facility] OR;  Service: Thoracic;  Laterality: Left;   TEE WITHOUT CARDIOVERSION N/A 04/10/2014   Procedure: TRANSESOPHAGEAL ECHOCARDIOGRAM (TEE);  Surgeon: Donato Schultz, MD;  Location: [facility];  Service: Cardiovascular;  Laterality: N/A;   VIDEO BRONCHOSCOPY WITH ENDOBRONCHIAL NAVIGATION N/A 09/01/2021   Procedure: VIDEO BRONCHOSCOPY WITH ENDOBRONCHIAL NAVIGATION;  Surgeon: Loreli Slot, MD;  Location: [facility] OR;  Service: Thoracic;  Laterality: N/A;    SOCIAL HISTORY:  Social History   Socioeconomic History   Marital status: Widowed    Spouse name: Not on file  Number of children: 0   Years of education: 12   Highest education level: 12th grade  Occupational History   Not on file  Tobacco Use   Smoking status: Former    Packs/day: .25    Types: Cigarettes    Passive exposure: Current   Smokeless tobacco: Never   Tobacco comments:    Smoking Cessation Classes, Services, Agencies & Resources Offered.  Vaping Use   Vaping Use: Never used  Substance and Sexual Activity   Alcohol use: No   Drug use: No   Sexual activity: Not Currently  Other  Topics Concern   Not on file  Social History Narrative   Not on file   Social Determinants of Health   Financial Resource Strain: Low Risk  (10/24/2022)   Overall Financial Resource Strain (CARDIA)    Difficulty of Paying Living Expenses: Not hard at all  Food Insecurity: No Food Insecurity (12/01/2022)   Hunger Vital Sign    Worried About Running Out of Food in the Last Year: Never true    Ran Out of Food in the Last Year: Never true  Transportation Needs: No Transportation Needs (12/01/2022)   PRAPARE - Administrator, Civil Service (Medical): No    Lack of Transportation (Non-Medical): No  Physical Activity: Inactive (10/24/2022)   Exercise Vital Sign    Days of Exercise per Week: 0 days    Minutes of Exercise per Session: 0 min  Stress: No Stress Concern Present (12/01/2022)   [employer] of Occupational Health - Occupational Stress Questionnaire    Feeling of Stress : Only a little  Social Connections: Moderately Isolated (12/01/2022)   Social Connection and Isolation Panel [NHANES]    Frequency of Communication with Friends and Family: More than three times a week    Frequency of Social Gatherings with Friends and Family: More than three times a week    Attends Religious Services: More than 4 times per year    Active Member of [employer] or Organizations: No    Attends Banker Meetings: Never    Marital Status: Widowed  Intimate Partner Violence: Not At Risk (11/15/2022)   Humiliation, Afraid, Rape, and Kick questionnaire    Fear of Current or Ex-Partner: No    Emotionally Abused: No    Physically Abused: No    Sexually Abused: No    FAMILY HISTORY:  Family History  Problem Relation Age of Onset   Hypertension Mother    Thyroid disease Mother    Hypertension Father    Thyroid disease Sister    Thyroid disease Brother    Thyroid disease Sister     CURRENT MEDICATIONS:  Current Outpatient Medications  Medication Sig Dispense Refill    amLODipine (NORVASC) 5 MG tablet Take 1 tablet (5 mg total) by mouth daily. 30 tablet 1   aspirin 81 MG EC tablet Take 81 mg by mouth in the morning.     Cholecalciferol (VITAMIN D3) 125 MCG (5000 UT) CAPS Take 5,000 Units by mouth daily.     clopidogrel (PLAVIX) 75 MG tablet Take 1 tablet (75 mg total) by mouth daily. 30 tablet 2   feeding supplement (ENSURE ENLIVE / ENSURE PLUS) LIQD Take 237 mLs by mouth 2 (two) times daily between meals. 237 mL 12   furosemide (LASIX) 20 MG tablet Take 1 tablet (20 mg total) by mouth daily as needed for edema (or weight gain 3 pounds or more). 30 tablet    Ipratropium-Albuterol (COMBIVENT RESPIMAT)  20-100 MCG/ACT AERS respimat Inhale 1 puff into the lungs every 6 (six) hours as needed for wheezing or shortness of breath. 4 g 2   ipratropium-albuterol (DUONEB) 0.5-2.5 (3) MG/3ML SOLN Take 3 mLs by nebulization every 4 (four) hours as needed (wheezing/shortness of breath).     Iron, Ferrous Sulfate, 325 (65 Fe) MG TABS Take by mouth.     isosorbide mononitrate (IMDUR) 60 MG 24 hr tablet Take 1.5 tablets (90 mg total) by mouth daily. 90 tablet 2   metoprolol tartrate (LOPRESSOR) 25 MG tablet Take 1 tablet (25 mg total) by mouth 2 (two) times daily. 60 tablet 2   Multiple Vitamin (MULTIVITAMIN WITH MINERALS) TABS tablet Take 1 tablet by mouth daily.     nitroGLYCERIN (NITROSTAT) 0.4 MG SL tablet Place 1 tablet (0.4 mg total) under the tongue every 5 (five) minutes as needed for chest pain. 100 tablet 1   ondansetron (ZOFRAN) 4 MG tablet Take 1 tablet (4 mg total) by mouth every 6 (six) hours as needed for nausea. 20 tablet 0   rosuvastatin (CRESTOR) 20 MG tablet Take 1 tablet (20 mg total) by mouth daily at 6 PM. 30 tablet 2   No current facility-administered medications for this visit.   Facility-Administered Medications Ordered in Other Visits  Medication Dose Route Frequency Provider Last Rate Last Admin   epoetin alfa-epbx (RETACRIT) injection 10,000 Units   10,000 Units Subcutaneous Once Mauro Kaufmann, NP        ALLERGIES:  No Known Allergies  PHYSICAL EXAM:  Performance status (ECOG): 2 - Symptomatic, <50% confined to bed  Vitals:   12/05/22 1118  BP: (!) 168/67  Pulse: 68  Resp: 18  Temp: (!) 96.7 F (35.9 C)  SpO2: 97%   Wt Readings from Last 3 Encounters:  12/08/22 101 lb 12.8 oz (46.2 kg)  12/01/22 108 lb (49 kg)  11/24/22 108 lb (49 kg)   Physical Exam Constitutional:      Appearance: Normal appearance.  HENT:     Head: Normocephalic and atraumatic.  Eyes:     Pupils: Pupils are equal, round, and reactive to light.  Cardiovascular:     Rate and Rhythm: Normal rate and regular rhythm.     Heart sounds: Normal heart sounds. No murmur heard. Pulmonary:     Effort: Pulmonary effort is normal.     Breath sounds: Normal breath sounds. No wheezing.  Abdominal:     General: Bowel sounds are normal. There is no distension.     Palpations: Abdomen is soft.     Tenderness: There is no abdominal tenderness.  Musculoskeletal:        General: Normal range of motion.     Cervical back: Normal range of motion.  Skin:    General: Skin is warm and dry.     Findings: No rash.  Neurological:     Mental Status: She is alert and oriented to person, place, and time.  Psychiatric:        Judgment: Judgment normal.      LABORATORY DATA:  I have reviewed the labs as listed.     Latest Ref Rng & Units 12/05/2022   12:20 PM 12/01/2022    1:56 AM 11/29/2022    9:36 AM  CBC  WBC 4.0 - 10.5 K/uL 4.3  7.5  6.5   Hemoglobin 12.0 - 15.0 g/dL 9.9  7.8  7.5   Hematocrit 36.0 - 46.0 % 30.8  24.2  24.6   Platelets 150 -  400 K/uL 117  138  141       Latest Ref Rng & Units 12/01/2022    1:56 AM 11/29/2022    9:36 AM 11/27/2022   10:17 AM  CMP  Glucose 70 - 99 mg/dL 161  88  98   BUN 8 - 23 mg/dL 54  54  58   Creatinine 0.44 - 1.00 mg/dL 0.96  0.45  4.09   Sodium 135 - 145 mmol/L 137  134  140   Potassium 3.5 - 5.1 mmol/L 4.0  4.7   5.5   Chloride 98 - 111 mmol/L 104  103  104   CO2 22 - 32 mmol/L 25  27  28    Calcium 8.9 - 10.3 mg/dL 8.3  8.3  9.2   Total Protein 6.5 - 8.1 g/dL  7.6  7.3   Total Bilirubin 0.3 - 1.2 mg/dL  0.8  0.5   Alkaline Phos 38 - 126 U/L  45  48   AST 15 - 41 U/L  30  34   ALT 0 - 44 U/L  32  35     DIAGNOSTIC IMAGING:  I have independently reviewed the scans and discussed with the patient. CT CHEST WO CONTRAST  Result Date: 12/04/2022 CLINICAL DATA:  Restaging non-small cell lung cancer. * Tracking Code: BO * EXAM: CT CHEST WITHOUT CONTRAST TECHNIQUE: Multidetector CT imaging of the chest was performed following the standard protocol without IV contrast. RADIATION DOSE REDUCTION: This exam was performed according to the departmental dose-optimization program which includes automated exposure control, adjustment of the mA and/or kV according to patient size and/or use of iterative reconstruction technique. COMPARISON:  11/15/2022 FINDINGS: Cardiovascular: Heart size appears within normal limits. No pericardial effusion. Aortic atherosclerosis and coronary artery calcifications. Mediastinum/Nodes: Enlarged scratch set heterogeneous and enlarged thyroid gland is again noted. This has been evaluated on previous imaging. (ref: J Am Coll Radiol. 2015 Feb;12(2): 143-50). Trachea and esophagus are unremarkable. No enlarged mediastinal or axillary lymph nodes. Previously referenced AP window lymph node measures 7 mm on today's study, image 62/2. Formally 0.9 cm. Lungs/Pleura: Emphysema. No pleural effusion, airspace consolidation, atelectasis or pneumothorax. Progressive masslike architectural distortion, fibrosis and volume loss is identified within the left upper lobe, which is in compatible with changes secondary to external beam radiation, image 38/4. The underlying treated tumor is indistinguishable from these changes. There are several new non solid nodules within the right upper lobe which are favored to be  inflammatory/infectious in etiology. The largest of these measures 1.1 cm, image 70/4. Upper Abdomen: No acute findings. Extensive scratch set aortic atherosclerosis. No acute abnormality. Musculoskeletal: No chest wall mass or suspicious bone lesions identified. IMPRESSION: 1. Progressive masslike architectural distortion, fibrosis and volume loss is identified within the left upper lobe, which is in compatible with changes secondary to external beam radiation. The underlying treated tumor is indistinguishable from these changes. 2. There are several new non solid nodules within the right upper lobe which are favored to be inflammatory in etiology. The largest of these measures 1.1 cm. Attention in these areas on follow-up imaging is recommended. 3. Coronary artery calcifications noted. 4.  Aortic Atherosclerosis (ICD10-I70.0). Electronically Signed   By: Signa Kell M.D.   On: 12/04/2022 07:34   DG Chest Portable 1 View  Result Date: 12/01/2022 CLINICAL DATA:  Chest pain and tightness EXAM: PORTABLE CHEST 1 VIEW COMPARISON:  11/18/2022 FINDINGS: Left apical opacity with superimposed fiducial markers, stable. There is no edema, consolidation, effusion,  or pneumothorax. Normal heart size and mediastinal contours. IMPRESSION: 1. Left apical opacity with superimposed fiducial markers. Reference recent chest CT. 2. No acute superimposed finding. Electronically Signed   By: Tiburcio Pea M.D.   On: 12/01/2022 05:09   DG CHEST PORT 1 VIEW  Result Date: 11/18/2022 CLINICAL DATA:  Shortness of breath EXAM: PORTABLE CHEST 1 VIEW COMPARISON:  11/15/2022 FINDINGS: Irregular 3.8 cm in long axis left apical mass with associated fiducials. Atherosclerotic calcification of the aortic arch. Heart size within normal limits. The lungs appear otherwise clear. No blunting of the costophrenic angles. No significant bony abnormality. IMPRESSION: 1. 3.8 cm irregular left apical mass with associated fiducials. 2.  Atherosclerotic calcification of the aortic arch. Aortic Atherosclerosis (ICD10-I70.0). 3. No acute findings. Electronically Signed   By: Gaylyn Rong M.D.   On: 11/18/2022 13:50   ECHOCARDIOGRAM COMPLETE  Result Date: 11/16/2022    ECHOCARDIOGRAM REPORT   Patient Name:   Lisa [name] Date of Exam: 11/16/2022 Medical Rec #:  [id number]          Height:       67.0 in Accession #:    [id number]         Weight:       103.8 lb Date of Birth:  [birth year]           BSA:          1.531 m Patient Age:    87 years           BP:           161/69 mmHg Patient Gender: F                  HR:           88 bpm. [facility] Procedure: 2D Echo, Color Doppler and Cardiac Doppler Indications:    acute diastolic chf  History:        Patient has prior history of Echocardiogram examinations, most                 recent 12/28/2021. COPD and chronic kidney disease,                 Signs/Symptoms:elevated troponin; Risk Factors:Hypertension.  Sonographer:    Delcie Roch RDCS Referring Phys: [id number] Lennart Pall STRADER IMPRESSIONS  1. Left ventricular ejection fraction, by estimation, is 55 to 60%. The left ventricle has normal function. The left ventricle demonstrates regional wall motion abnormalities (see scoring diagram/findings for description). Left ventricular diastolic parameters are consistent with Grade II diastolic dysfunction (pseudonormalization).  2. Right ventricular systolic function is normal. The right ventricular size is normal. There is severely elevated pulmonary artery systolic pressure.  3. Left atrial size was mildly dilated.  4. The mitral valve is abnormal. Mild mitral valve regurgitation. No evidence of mitral stenosis. Severe mitral annular calcification.  5. The aortic valve has an indeterminant number of cusps. Aortic valve regurgitation is not visualized. No aortic stenosis is present.  6. The inferior vena cava is dilated in size with >50% respiratory variability, suggesting right  atrial pressure of 8 mmHg. Comparison(s): Prior images reviewed side by side. Changes from prior study are noted. RWMA with akinesis in the basal inferior wall and basal inferoseptal wall are new. FINDINGS  Left Ventricle: Left ventricular ejection fraction, by estimation, is 55 to 60%. The left ventricle has normal function. The left ventricle demonstrates regional wall motion abnormalities. The left ventricular internal cavity size  was normal in size. There is no left ventricular hypertrophy. Left ventricular diastolic parameters are consistent with Grade II diastolic dysfunction (pseudonormalization).  LV Wall Scoring: The basal inferior segment and basal inferoseptal segment are akinetic. Right Ventricle: The right ventricular size is normal. No increase in right ventricular wall thickness. Right ventricular systolic function is normal. There is severely elevated pulmonary artery systolic pressure. The tricuspid regurgitant velocity is 4.25 m/s, and with an assumed right atrial pressure of 8 mmHg, the estimated right ventricular systolic pressure is 80.2 mmHg. Left Atrium: Left atrial size was mildly dilated. Right Atrium: Right atrial size was normal in size. Pericardium: There is no evidence of pericardial effusion. Mitral Valve: The mitral valve is abnormal. Severe mitral annular calcification. Mild mitral valve regurgitation. No evidence of mitral valve stenosis. Tricuspid Valve: The tricuspid valve is grossly normal. Tricuspid valve regurgitation is mild . No evidence of tricuspid stenosis. Aortic Valve: The aortic valve has an indeterminant number of cusps. Aortic valve regurgitation is not visualized. No aortic stenosis is present. Pulmonic Valve: The pulmonic valve was not well visualized. Pulmonic valve regurgitation is trivial. No evidence of pulmonic stenosis. Aorta: The aortic root is normal in size and structure. Venous: The inferior vena cava is dilated in size with greater than 50% respiratory  variability, suggesting right atrial pressure of 8 mmHg. IAS/Shunts: No atrial level shunt detected by color flow Doppler.  LEFT VENTRICLE PLAX 2D LVIDd:         4.00 cm   Diastology LVIDs:         3.00 cm   LV e' medial:    5.44 cm/s LV PW:         1.20 cm   LV E/e' medial:  25.9 LV IVS:        1.10 cm   LV e' lateral:   6.96 cm/s LVOT diam:     1.50 cm   LV E/e' lateral: 20.3 LV SV:         24 LV SV Index:   16 LVOT Area:     1.77 cm  RIGHT VENTRICLE             IVC RV Basal diam:  2.70 cm     IVC diam: 2.50 cm RV S prime:     12.90 cm/s TAPSE (M-mode): 2.4 cm LEFT ATRIUM             Index        RIGHT ATRIUM           Index LA diam:        4.00 cm 2.61 cm/m   RA Area:     11.40 cm LA Vol (A2C):   50.9 ml 33.26 ml/m  RA Volume:   23.90 ml  15.62 ml/m LA Vol (A4C):   56.9 ml 37.18 ml/m LA Biplane Vol: 56.3 ml 36.78 ml/m  AORTIC VALVE LVOT Vmax:   78.00 cm/s LVOT Vmean:  49.700 cm/s LVOT VTI:    0.137 m  AORTA Ao Root diam: 2.60 cm MITRAL VALVE                TRICUSPID VALVE MV Area (PHT): 4.60 cm     TR Peak grad:   72.2 mmHg MV Decel Time: 165 msec     TR Vmax:        425.00 cm/s MV E velocity: 141.00 cm/s MV A velocity: 72.80 cm/s   SHUNTS MV E/A ratio:  1.94  Systemic VTI:  0.14 m                             Systemic Diam: 1.50 cm Vishnu Priya Mallipeddi Electronically signed by Winfield Rast Mallipeddi Signature Date/Time: 11/16/2022/1:41:45 PM    Final    DG Chest 1 View  Result Date: 11/15/2022 CLINICAL DATA:  Shortness of breath. Acute on chronic respiratory failure. History of COPD and adenocarcinoma of left lung. EXAM: CHEST  1 VIEW COMPARISON:  11/15/2022 FINDINGS: Heart size and pulmonary vascularity are normal. Peribronchial thickening and central interstitial changes consistent with chronic bronchitis. Masslike areas consolidation surrounding surgical clips corresponds to previous neoplasm and treatment changes. No new or developing consolidation or atelectasis. No pleural effusions. No  pneumothorax. Calcification of the aorta. IMPRESSION: Chronic bronchitic changes in the lungs. Left upper lobe area of masslike consolidation with surgical clips likely corresponds to known neoplasm and post treatment changes. No change since prior study earlier today. Electronically Signed   By: Burman Nieves M.D.   On: 11/15/2022 23:29   CT Chest Wo Contrast  Result Date: 11/15/2022 CLINICAL DATA:  Shortness of breath. History of lung cancer. Left apical nodular density on a portable chest radiograph today with an appearance suspicious for recurrent non-small-cell lung cancer. Chest CT was recommended for further evaluation. Status post radiation therapy. EXAM: CT CHEST WITHOUT CONTRAST TECHNIQUE: Multidetector CT imaging of the chest was performed following the standard protocol without IV contrast. RADIATION DOSE REDUCTION: This exam was performed according to the departmental dose-optimization program which includes automated exposure control, adjustment of the mA and/or kV according to patient size and/or use of iterative reconstruction technique. COMPARISON:  Chest radiographs dated 11/15/2022 and 12/29/2021. Chest CT dated 05/31/2022. FINDINGS: Cardiovascular: Atheromatous calcifications, including the coronary arteries and aorta. Normal sized heart. No pericardial effusion. Mediastinum/Nodes: Marked diffuse enlargement of the thyroid gland with diffuse heterogeneity and calcifications and substernal extension. This is causing mild tracheal narrowing without significant change. No enlarged lymph nodes. The previously demonstrated 9 mm short axis AP window node measures 8 mm in short axis diameter on image number 64/2. Unremarkable esophagus. Lungs/Pleura: Multiple surgical clips in an oval area of consolidation and adjacent prominent interstitial markings in the left upper lobe, containing multiple interval surgical clips. This area measures 5.3 x 2.6 cm on image number 35/4 and corresponds to the  mass-like density seen the portable chest obtained earlier today. This extends posteriorly to the major fissure with associated pleural thickening. The previously demonstrated left upper lobe nodule in this area is no longer visualized. No new lung nodules and no pleural fluid. The lungs remain hyperexpanded. Upper Abdomen: Extensive atheromatous arterial calcifications. Cholecystectomy clips. Musculoskeletal: Lower cervical spine degenerative changes and mild thoracic spine degenerative changes. IMPRESSION: 1. 5.3 x 2.6 cm oval area of consolidation and adjacent prominent interstitial markings in the left upper lobe, corresponding to the mass-like density seen on the portable chest obtained earlier today. This is compatible with postsurgical and post radiation changes. 2. The previously demonstrated left upper lobe nodule in this area is no longer visualized, compatible with interval resection. 3. No enlarged lymph nodes. 4. Stable large thyroid goiter causing mild tracheal narrowing. This has been previously evaluated with imaging. 5. Calcific coronary artery and aortic atherosclerosis. 6. Stable changes of COPD. Aortic Atherosclerosis (ICD10-I70.0) and Emphysema (ICD10-J43.9). Electronically Signed   By: Beckie Salts M.D.   On: 11/15/2022 10:53   DG Chest [facility]  Result Date: 11/15/2022 CLINICAL DATA:  Shortness of breath, history of lung cancer. EXAM: PORTABLE CHEST 1 VIEW COMPARISON:  May 29, 2022.  December 29, 2021. FINDINGS: The heart size and mediastinal contours are within normal limits. There is noted large left apical nodular density concerning for malignancy. Surgical clips are seen in this area. Right lung is unremarkable. The visualized skeletal structures are unremarkable. IMPRESSION: Large left apical nodular density is noted concerning for recurrent malignancy. CT scan of the chest is recommended for further evaluation. Electronically Signed   By: Lupita Raider M.D.   On: 11/15/2022  08:18     ASSESSMENT:  IgG kappa smoldering myeloma: - Patient seen at the request of Dr. Wolfgang Phoenix for monoclonal gammopathy. - Labs on 05/12/2021 showed M spike 2.4 g.  Immunofixation shows IgG kappa monoclonal protein with a faint IgA lambda monoclonal protein. - Kappa light chains 143, lambda light chains 38, ratio of 3.72. - 24-hour urine total protein 84 mg.  Urine immunofixation shows faint IgG kappa. - No B symptoms or neuropathy. - BMBX on 07/14/2021: Pathology shows hypocellular marrow with clonal plasma cell population comprising 15-20% of the total marrow cellularity.  No overt dysplasia. - Cytogenetics: 45,X-X[9]/46,XX[11] - Myeloma FISH panel: Monosomy 13, duplication 1 q. - Skeletal survey on 06/21/2021: 1 cm circumscribed sclerotic lesion in the distal tibial shaft on the right, nonspecific.  No lytic bone lesion.   Social/family history: - She lives with her nephew.  She is seen with her brother today.  She uses walker to ambulate. 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 - Current active smoker, half pack per day for 62 years.  Stage I (T1 N0 M0) adenocarcinoma of the left upper lobe of the lung:       - PET scan on 08/04/2021: 1.7 x 1.4 cm left upper lobe nodule SUV 18.  No hypermetabolic mediastinal or hilar adenopathy.  No evidence of metastatic disease. - Navigational bronchoscopy and biopsy by Dr. Dorris Fetch on 09/01/2021.  Not a surgical candidate per Dr. Dorris Fetch. - Pathology consistent with moderately differentiated adenocarcinoma. - SBRT to the left upper lobe lung lesion, 54 Gray in 3 fractions completed in May 2023  4.  Thyroid nodules: - Reviewed biopsy of the LML nodule, consistent with benign follicular nodule Bethesda category 2. - Biopsy of the RML-Bethesda category 3, atypia of undetermined significance.  Afirma test was negative.  PLAN:  IgG kappa smoldering myeloma: -PET scan on 08/04/2021: Did not show any lytic lesions. - Myeloma  labs from 11/29/2022 showed M spike of 2.4 which is stable.  Free light chain ratio is 2.09 with predominantly kappa free light chains.  Immunofixation shows IgG kappa.  2.Stage I (T1 N0 M0) adenocarcinoma of the left upper lobe of the lung: - CT chest from 11/29/2022 showed progressive masslike architectural distortion, fibrosis and volume loss identified within the left upper lobe which is incompatible with changes secondary to external beam radiation.  The underlying treated tumor is indistinguishable from these changes.  There are several new nonsolid nodules within the right upper lobe which are favored to be inflammatory in etiology.   -Discussed imaging with Dr. Ellin Saba who recommends repeat CT scan in approximately 6 months.  3.  Normocytic anemia/pancytopenia: - Anemia from chronic inflammation and CKD. -Hemoglobin is 7.5- pre blood transfusion.  Will redraw labs today. - Received 1 unit of blood on 12/01/2022 and received 1 unit of blood while hospitalized for her NSTEMI earlier in  the month. -Ferritin is 452, iron saturation 17%.  B12 and folate are normal. -Discussed ESA treatment if hemoglobin continues to trend down.  Addendum- Spoke with Dr. Ellin Saba who reviewed her chart.  Given recent hypertensive crisis along with NSTEMI and is currently on Plavix would recommend holding off on ESA treatment at this time.  Patient would like to have labs checked bimonthly and opts for blood transfusion if hemoglobin is less than 8.    PLAN SUMMARY: >> Discussed ESA treatment.  Patient has opted for biweekly lab work and blood transfusion for hemoglobin less than 8. >> Recommend follow-up for labs biweekly and in 12 weeks for MGUS labs.  Repeat CT scan in 6 months.    I spent 20 minutes dedicated to the care of this patient (face-to-face and non-face-to-face) on the date of the encounter to include what is described in the assessment and plan.   Orders placed this encounter:  Orders  Placed This Encounter  Procedures   CT Chest Wo Contrast    I spent 30 minutes dedicated to the care of this patient (face-to-face and non-face-to-face) on the date of the encounter to include what is described in the assessment and plan.

## 2022-12-05 NOTE — Patient Outreach (Signed)
  Care Coordination   Follow Up Visit Note   12/05/2022 Name: Lisa [name] MRN: [id number] DOB: 09-12-1934  Lisa [name] is a 88 y.o. year old female who sees Durwin Nora, Lucina Mellow, MD for primary care. I spoke with yvette, niece, Sharlonda Shellhouse by phone today.  What matters to the patients health and wellness today?  Iron deficiency anemia, Went to cancer MD today, consulting with providers about possible changing from infusions to injection to raise hemoglobin- Lisa  Bronson [name] reports the patient in good spirits and improving  No immediate needs today   Goals Addressed             This Visit's Progress    [facility] care coordination services (MI, anemia)       Interventions Today    Flowsheet Row Most Recent Value  Chronic Disease   Chronic disease during today's visit Other  General Interventions   General Interventions Discussed/Reviewed General Interventions Reviewed, Doctor Visits  Doctor Visits Discussed/Reviewed Doctor Visits Discussed, PCP, Specialist  PCP/Specialist Visits Compliance with follow-up visit  Mental Health Interventions   Mental Health Discussed/Reviewed Mental Health Reviewed, Coping Strategies              SDOH assessments and interventions completed:  No     Care Coordination Interventions:  Yes, provided   Follow up plan: Follow up call scheduled for 12/20/22    Encounter Outcome:  Pt. Visit Completed   Julianna Vanwagner L. Noelle Penner, RN, BSN, CCM [facility] Care Management Community Coordinator Office number [phone]

## 2022-12-07 ENCOUNTER — Inpatient Hospital Stay: Payer: Medicare Other

## 2022-12-07 ENCOUNTER — Inpatient Hospital Stay (HOSPITAL_BASED_OUTPATIENT_CLINIC_OR_DEPARTMENT_OTHER): Payer: Medicare Other | Admitting: Hematology

## 2022-12-07 DIAGNOSIS — N184 Chronic kidney disease, stage 4 (severe): Secondary | ICD-10-CM

## 2022-12-07 DIAGNOSIS — D472 Monoclonal gammopathy: Secondary | ICD-10-CM

## 2022-12-07 DIAGNOSIS — D631 Anemia in chronic kidney disease: Secondary | ICD-10-CM

## 2022-12-07 NOTE — Progress Notes (Signed)
Virtual Visit via Telephone Note  I connected with Lisa [name] on 12/07/22 at  1:15 PM EDT by telephone and verified that I am speaking with the correct person using two identifiers.  Location: Patient: At home Provider: In the office   I discussed the limitations, risks, security and privacy concerns of performing an evaluation and management service by telephone and the availability of in person appointments. I also discussed with the patient that there may be a patient responsible charge related to this service. The patient expressed understanding and agreed to proceed.   History of Present Illness: She is seen in our clinic for IgG kappa smoldering myeloma, stage I left lung cancer and normocytic anemia.   Observations/Objective: Patient reports that she has been feeling well since she had recent blood transfusion.  Hemoglobin improved to 9.9.  She had recent hospitalization for MI and was started on Plavix.  She is also taking baby aspirin daily.  Assessment and Plan:  1.  Normocytic anemia: - Anemia from CKD and functional iron deficiency.  Hemoglobin is remaining between 7 and 8.  Last ferritin was 452 and percent saturation 17.  B12 was normal. - She is continuing to require intermittent transfusions. - We discussed role of erythropoiesis stimulating agents to minimize transfusion requirement.  We discussed side effects including hypotension. - She reports that her hypertension is poorly controlled now.  Hence she would like to not start ESA's at this time. - We will monitor her CBC every 2 weeks with blood bank sample and possible transfusion as she is on dual antiplatelet therapy. - Discussed with patient and her niece on the phone.  2.  IgG kappa smoldering myeloma: - We have reviewed her myeloma labs.  M spike is stable at 2.4 g and free light chain ratio is also stable. - No suspicious bone lesions on the recent chest CT scan. - If there is any worsening anemia,  consider bone marrow aspiration and biopsy.   Follow Up Instructions:    I discussed the assessment and treatment plan with the patient. The patient was provided an opportunity to ask questions and all were answered. The patient agreed with the plan and demonstrated an understanding of the instructions.   The patient was advised to call back or seek an in-person evaluation if the symptoms worsen or if the condition fails to improve as anticipated.  I provided 21 minutes of non-face-to-face time during this encounter.   Doreatha Massed, MD

## 2022-12-08 ENCOUNTER — Encounter: Payer: Self-pay | Admitting: Internal Medicine

## 2022-12-08 ENCOUNTER — Encounter: Payer: Self-pay | Admitting: Student

## 2022-12-08 ENCOUNTER — Ambulatory Visit: Payer: Medicare Other | Attending: Student | Admitting: Student

## 2022-12-08 ENCOUNTER — Encounter (HOSPITAL_COMMUNITY): Payer: Self-pay | Admitting: Hematology

## 2022-12-08 ENCOUNTER — Ambulatory Visit (INDEPENDENT_AMBULATORY_CARE_PROVIDER_SITE_OTHER): Payer: Medicare Other | Admitting: Internal Medicine

## 2022-12-08 VITALS — BP 140/40 | HR 80 | Ht 67.0 in | Wt 101.8 lb

## 2022-12-08 VITALS — BP 174/70 | HR 75 | Ht 68.0 in | Wt 101.8 lb

## 2022-12-08 DIAGNOSIS — N184 Chronic kidney disease, stage 4 (severe): Secondary | ICD-10-CM

## 2022-12-08 DIAGNOSIS — I10 Essential (primary) hypertension: Secondary | ICD-10-CM

## 2022-12-08 DIAGNOSIS — I214 Non-ST elevation (NSTEMI) myocardial infarction: Secondary | ICD-10-CM

## 2022-12-08 DIAGNOSIS — I471 Supraventricular tachycardia, unspecified: Secondary | ICD-10-CM

## 2022-12-08 DIAGNOSIS — I2510 Atherosclerotic heart disease of native coronary artery without angina pectoris: Secondary | ICD-10-CM | POA: Insufficient documentation

## 2022-12-08 DIAGNOSIS — D631 Anemia in chronic kidney disease: Secondary | ICD-10-CM | POA: Diagnosis not present

## 2022-12-08 DIAGNOSIS — I25118 Atherosclerotic heart disease of native coronary artery with other forms of angina pectoris: Secondary | ICD-10-CM | POA: Diagnosis not present

## 2022-12-08 DIAGNOSIS — I5032 Chronic diastolic (congestive) heart failure: Secondary | ICD-10-CM

## 2022-12-08 MED ORDER — ISOSORBIDE MONONITRATE ER 60 MG PO TB24
90.0000 mg | ORAL_TABLET | Freq: Every day | ORAL | 2 refills | Status: DC
Start: 2022-12-08 — End: 2022-12-12

## 2022-12-08 MED ORDER — FUROSEMIDE 20 MG PO TABS: 20.0000 mg | ORAL_TABLET | Freq: Every day | ORAL | 3 refills | Status: DC

## 2022-12-08 NOTE — Progress Notes (Signed)
Cardiology Office Note    Date:  12/08/2022  ID:  Lisa [name], DOB 1934-08-20, MRN [id number] Cardiologist: Marjo Bicker, MD    History of Present Illness:    Lisa [name] is a 88 y.o. female with past medical history of elevated troponin values (occurring during admission in 08/2021 following bronchoscopy and medical management pursued given Stage 4 CKD), SVT, HTN, Stage 4 CKD, COPD, adenocarcinoma of left lung (s/p radiation) and MGUS who presents to the office today for hospital follow-up.  She was recently admitted to [facility] on 11/15/2022 for evaluation of worsening dyspnea on exertion. Was found to have acute hypoxic respiratory failure and this was felt to be multifactorial in the setting of an acute CHF exacerbation and COPD exacerbation. Repeat echocardiogram did show a preserved EF of 55 to 60% but was noted to have wall motion abnormalities with akinesis of the basal inferior and inferior septal wall.  Her initial troponin values were at 692 and 504 but repeat values during admission were elevated to 12,379.  Given her age, CKD and malnutrition with BMI at 13, she was felt to be at high-risk for cardiac catheterization and had previously declined this and still wished to avoid invasive procedures. Therefore, medical management was recommended and she was treated with IV Heparin and this was transitioned to ASA and Plavix at the time of discharge.  Was also discharged on Lasix 20 mg daily, Lopressor 25 mg twice daily, Amlodipine 5 mg daily and Imdur 60 mg daily.   In the interim, she did present to the ED on 12/01/2022 for evaluation of chest pain which awoke her from sleep. She did take nitroglycerin at home and her pain had mostly resolved upon arrival to the ED. Troponin values were mildly elevated at 37 and 42. Given that she did not want any invasive procedures, she was discharged home.  In talking with the patient and two of her family members today, she reports  overall doing well since her recent Emergency Department visit. She denies any recurrent episodes of chest pain. Reports her breathing has been stable with no recent dyspnea on exertion, orthopnea or PND. She has been taking Lasix 20 mg twice daily as she was previously experiencing lower extremity edema but says this has now improved.  Studies Reviewed:   EKG: EKG is not ordered today. EKG from 12/01/2022 is reviewed and shows NSR, HR 77 with LVH and associated repol abnormalities.   Echocardiogram: 11/2022 IMPRESSIONS     1. Left ventricular ejection fraction, by estimation, is 55 to 60%. The  left ventricle has normal function. The left ventricle demonstrates  regional wall motion abnormalities (see scoring diagram/findings for  description). Left ventricular diastolic  parameters are consistent with Grade II diastolic dysfunction  (pseudonormalization).   2. Right ventricular systolic function is normal. The right ventricular  size is normal. There is severely elevated pulmonary artery systolic  pressure.   3. Left atrial size was mildly dilated.   4. The mitral valve is abnormal. Mild mitral valve regurgitation. No  evidence of mitral stenosis. Severe mitral annular calcification.   5. The aortic valve has an indeterminant number of cusps. Aortic valve  regurgitation is not visualized. No aortic stenosis is present.   6. The inferior vena cava is dilated in size with >50% respiratory  variability, suggesting right atrial pressure of 8 mmHg.   Comparison(s): Prior images reviewed side by side. Changes from prior  study are noted. RWMA with  akinesis in the basal inferior wall and basal  inferoseptal wall are new.   Risk Assessment/Calculations:     HYPERTENSION CONTROL Vitals:   12/08/22 1411  BP: (!) 140/40    The patient's blood pressure is elevated above target today.  In order to address the patient's elevated BP: A current anti-hypertensive medication was adjusted  today.         Physical Exam:   VS:  BP (!) 140/40   Pulse 80   Ht 5\' 7"  (1.702 m)   Wt 101 lb 12.8 oz (46.2 kg)   SpO2 95%   BMI 15.94 kg/m    Wt Readings from Last 3 Encounters:  12/08/22 101 lb 12.8 oz (46.2 kg)  12/08/22 101 lb 12.8 oz (46.2 kg)  12/01/22 108 lb (49 kg)     GEN: Pleasant, thin elderly female appearing in no acute distress NECK: No JVD; No carotid bruits CARDIAC: RRR, 2/6 systolic murmur along Apex.  RESPIRATORY:  Clear to auscultation without rales, wheezing or rhonchi  ABDOMEN: Appears non-distended. No obvious abdominal masses. EXTREMITIES: No clubbing or cyanosis. No pitting edema.  Distal pedal pulses are 2+ bilaterally.   Assessment and Plan:   1. CAD/Subsequent Episode of Care for NSTEMI - Recently admitted for an NSTEMI and medical management was pursued as discussed above. Earlier today, her PCP did titrate Imdur to 90mg  daily and I agree with this given recent episodes of chest pain last week. Will continue ASA, Plavix, Crestor and Lopresor at current dosing.   2. SVT - Maintaining NSR today and she denies any recent palpitations. Continue Lopressor 25mg  BID.   3. HFpEF - Recent echocardiogram showed a preserved EF of 55-60% and she was treated for an acute CHF exacerbation during her admission. Given her decreased PO intake and appearing euvolemic by examination today, I did recommended she reduce Lasix from 20mg  BID to 20mg  daily and she is aware she can take an extra tablet if needed for weight gain or worsening edema. Would not start an SGLT2 inhibitor at this time given her BMI of 16.   4. Stage 4 CKD - Baseline creatinine 1.7 - 1.8. Overall stable at 1.90 when checked on 12/01/2022.  5. HTN - BP is at 140/40 during today's visit but Imdur was just titrated by her PCP earlier today. Continue current regimen for now with Amlodipine, Imdur and Lopressor at current dosing.  Signed, Ellsworth Lennox, PA-C

## 2022-12-08 NOTE — Assessment & Plan Note (Signed)
BP remains significantly elevated today.  189/79 initially and 174/70 when repeated.  She is currently prescribed amlodipine 5 mg daily, Imdur 60 mg daily, and metoprolol tartrate 25 mg twice daily. -Increase Imdur to 90 mg daily -Follow-up in early August as previously scheduled for HTN check

## 2022-12-08 NOTE — Patient Instructions (Signed)
Medication Instructions:   DECREASE Lasix to 20 mg ONCE a day  Labwork: None today  Testing/Procedures: None today  Follow-Up: 3 months Dr.Mallipeddi   Any Other Special Instructions Will Be Listed Below (If Applicable).  If you need a refill on your cardiac medications before your next appointment, please call your pharmacy.

## 2022-12-08 NOTE — Assessment & Plan Note (Signed)
She received an additional 1 unit PRBC transfusion on 6/21 as hemoglobin decreased below 8.  Recently seen by hematology for follow-up.  Repeat CBC will be completed in 2 weeks.  Will further transfuse as indicated.

## 2022-12-08 NOTE — Progress Notes (Signed)
Established Patient Office Visit  Subjective   Patient ID: Lisa [name], female    DOB: [birth year]  Age: 88 y.o. MRN: [id number]  Chief Complaint  Patient presents with   Leg Swelling   medication review    Follow up with meds and from hospital visit    [name] returns to care today for ER follow-up.  ER presentation on 6/21 for chest discomfort.  Her workup at that time was largely unremarkable.  Symptoms resolved with use of nitroglycerin.  She was discharged home and recommended to follow-up with cardiology.  In the interim she has been seen by oncology for follow-up.  There have otherwise been no acute interval events.  [name] reports feeling well today.  She is asymptomatic currently and her acute concern is a rash on her right leg.  She has not experienced any additional episodes of chest pain since her ER presentation 1 week ago.  She is accompanied by her brother and niece today.  Past Medical History:  Diagnosis Date   Anemia    Carotid artery stenosis    CKD (chronic kidney disease)    COPD (chronic obstructive pulmonary disease) (HCC)    Dyspnea    Due to COPD per patient   Goiter diffuse, nontoxic    Per patient   Hepatitis C    History of radiation therapy    Left lung-10/10/21-10/17/21- Dr. Antony Blackbird   Hypertension    Monoclonal gammopathy of unknown significance (MGUS)    Pyelonephritis 04/06/2014   UTI   Past Surgical History:  Procedure Laterality Date   ABDOMINAL HYSTERECTOMY     CHOLECYSTECTOMY N/A 09/17/2018   Procedure: LAPAROSCOPIC CHOLECYSTECTOMY;  Surgeon: Lucretia Roers, MD;  Location: [facility];  Service: General;  Laterality: N/A;   FUDUCIAL PLACEMENT Left 09/01/2021   Procedure: PLACEMENT OF FIDUCIAL MARKERS TIMES FOUR;  Surgeon: Loreli Slot, MD;  Location: [facility] OR;  Service: Thoracic;  Laterality: Left;   TEE WITHOUT CARDIOVERSION N/A 04/10/2014   Procedure: TRANSESOPHAGEAL ECHOCARDIOGRAM (TEE);  Surgeon: Donato Schultz, MD;   Location: [facility] ENDOSCOPY;  Service: Cardiovascular;  Laterality: N/A;   VIDEO BRONCHOSCOPY WITH ENDOBRONCHIAL NAVIGATION N/A 09/01/2021   Procedure: VIDEO BRONCHOSCOPY WITH ENDOBRONCHIAL NAVIGATION;  Surgeon: Loreli Slot, MD;  Location: [facility] OR;  Service: Thoracic;  Laterality: N/A;   Social History   Tobacco Use   Smoking status: Former    Packs/day: .25    Types: Cigarettes    Passive exposure: Current   Smokeless tobacco: Never   Tobacco comments:    Smoking Cessation Classes, Services, Agencies & Resources Offered.  Vaping Use   Vaping Use: Never used  Substance Use Topics   Alcohol use: No   Drug use: No   Family History  Problem Relation Age of Onset   Hypertension Mother    Thyroid disease Mother    Hypertension Father    Thyroid disease Sister    Thyroid disease Brother    Thyroid disease Sister    No Known Allergies  Review of Systems  Constitutional:  Negative for chills and fever.  HENT:  Negative for sore throat.   Respiratory:  Negative for cough and shortness of breath.   Cardiovascular:  Negative for chest pain, palpitations and leg swelling.  Gastrointestinal:  Negative for abdominal pain, blood in stool, constipation, diarrhea, nausea and vomiting.  Genitourinary:  Negative for dysuria and hematuria.  Musculoskeletal:  Negative for myalgias.  Skin:  Positive for rash (Right lower leg).  Negative for itching.  Neurological:  Negative for dizziness and headaches.  Psychiatric/Behavioral:  Negative for depression and suicidal ideas.      Objective:     BP (!) 174/70   Pulse 75   Ht 5\' 8"  (1.727 m)   Wt 101 lb 12.8 oz (46.2 kg)   SpO2 94%   BMI 15.48 kg/m  BP Readings from Last 3 Encounters:  12/08/22 (!) 174/70  12/05/22 (!) 168/67  12/01/22 (!) 178/63   Physical Exam Vitals reviewed.  Constitutional:      General: She is not in acute distress.    Appearance: Normal appearance. She is not toxic-appearing.     Comments: Cachectic  appearance, examined in wheelchair  HENT:     Head: Normocephalic and atraumatic.     Right Ear: External ear normal.     Left Ear: External ear normal.     Nose: Nose normal. No congestion or rhinorrhea.     Mouth/Throat:     Mouth: Mucous membranes are moist.     Pharynx: Oropharynx is clear. No oropharyngeal exudate or posterior oropharyngeal erythema.  Eyes:     General: No scleral icterus.    Extraocular Movements: Extraocular movements intact.     Conjunctiva/sclera: Conjunctivae normal.     Pupils: Pupils are equal, round, and reactive to light.  Cardiovascular:     Rate and Rhythm: Normal rate and regular rhythm.     Pulses: Normal pulses.     Heart sounds: Normal heart sounds. No murmur heard.    No friction rub. No gallop.  Pulmonary:     Effort: Pulmonary effort is normal.     Breath sounds: Normal breath sounds. No wheezing, rhonchi or rales.  Abdominal:     General: Abdomen is flat. Bowel sounds are normal. There is no distension.     Palpations: Abdomen is soft.     Tenderness: There is no abdominal tenderness.  Musculoskeletal:        General: No swelling.     Cervical back: Normal range of motion.     Right lower leg: No edema.     Left lower leg: No edema.  Lymphadenopathy:     Cervical: No cervical adenopathy.  Skin:    General: Skin is warm and dry.     Capillary Refill: Capillary refill takes less than 2 seconds.     Coloration: Skin is not jaundiced.     Findings: Rash (Excoriated, nonpurulent, nonerythematous rash is present on the medial aspect of the right lower leg) present.  Neurological:     General: No focal deficit present.     Mental Status: She is alert and oriented to person, place, and time.  Psychiatric:        Mood and Affect: Mood normal.        Behavior: Behavior normal.   Last CBC Lab Results  Component Value Date   WBC 4.3 12/05/2022   HGB 9.9 (L) 12/05/2022   HCT 30.8 (L) 12/05/2022   MCV 96.0 12/05/2022   MCH 30.8 12/05/2022    RDW 13.6 12/05/2022   PLT 117 (L) 12/05/2022   Last metabolic panel Lab Results  Component Value Date   GLUCOSE 136 (H) 12/01/2022   NA 137 12/01/2022   K 4.0 12/01/2022   CL 104 12/01/2022   CO2 25 12/01/2022   BUN 54 (H) 12/01/2022   CREATININE 1.90 (H) 12/01/2022   GFRNONAA 26 (L) 12/01/2022   CALCIUM 8.3 (L) 12/01/2022   PHOS 2.5  11/21/2022   PROT 7.6 11/29/2022   ALBUMIN 2.8 (L) 11/29/2022   LABGLOB 4.1 (H) 11/29/2022   AGRATIO 0.8 11/29/2022   BILITOT 0.8 11/29/2022   ALKPHOS 45 11/29/2022   AST 30 11/29/2022   ALT 32 11/29/2022   ANIONGAP 8 12/01/2022   Last lipids Lab Results  Component Value Date   CHOL 127 11/17/2022   HDL 43 11/17/2022   LDLCALC 69 11/17/2022   TRIG 77 11/17/2022   CHOLHDL 3.0 11/17/2022   Last thyroid functions Lab Results  Component Value Date   TSH 1.140 04/09/2014   Last vitamin B12 and Folate Lab Results  Component Value Date   VITAMINB12 430 11/29/2022   FOLATE 17.0 05/29/2022     Assessment & Plan:   Problem List Items Addressed This Visit       Hypertension - Primary    BP remains significantly elevated today.  189/79 initially and 174/70 when repeated.  She is currently prescribed amlodipine 5 mg daily, Imdur 60 mg daily, and metoprolol tartrate 25 mg twice daily. -Increase Imdur to 90 mg daily -Follow-up in early August as previously scheduled for HTN check      CAD (coronary artery disease)    Presenting today for ER follow-up in the setting of recent ER presentation for chest pain.  She has a history of CAD with NSTEMI during recent hospital admission.  Cardiac workup during recent ER presentation was unremarkable.  Chest pain resolved with nitroglycerin.  There has been no recurrence of chest pain since discharge from the ER. -Increase Imdur to 90 mg daily -Cardiology follow-up recommended      Anemia due to stage 4 chronic kidney disease (HCC)    She received an additional 1 unit PRBC transfusion on 6/21 as  hemoglobin decreased below 8.  Recently seen by hematology for follow-up.  Repeat CBC will be completed in 2 weeks.  Will further transfuse as indicated.       Return if symptoms worsen or fail to improve.    [name], MD

## 2022-12-08 NOTE — Patient Instructions (Signed)
It was a pleasure to see you today.  Thank you for giving [place] the opportunity to be involved in your care.  Below is a brief recap of your visit and next steps.  We will plan to see you again in August.  Summary Increase Imdur to 90 mg daily Follow up in early August as previously scheduled

## 2022-12-08 NOTE — Assessment & Plan Note (Addendum)
Presenting today for ER follow-up in the setting of recent ER presentation for chest pain.  She has a history of CAD with NSTEMI during recent hospital admission.  Cardiac workup during recent ER presentation was unremarkable.  Chest pain resolved with nitroglycerin.  There has been no recurrence of chest pain since discharge from the ER. -Increase Imdur to 90 mg daily -Cardiology follow-up recommended

## 2022-12-11 ENCOUNTER — Ambulatory Visit: Payer: Self-pay | Admitting: *Deleted

## 2022-12-11 ENCOUNTER — Emergency Department (HOSPITAL_COMMUNITY)
Admission: EM | Admit: 2022-12-11 | Discharge: 2022-12-11 | Disposition: A | Discharge status: Home / Self Care | Payer: Medicare Other | Attending: Emergency Medicine | Admitting: Emergency Medicine

## 2022-12-11 ENCOUNTER — Other Ambulatory Visit: Payer: Self-pay

## 2022-12-11 ENCOUNTER — Emergency Department (HOSPITAL_COMMUNITY): Payer: Medicare Other

## 2022-12-11 ENCOUNTER — Encounter (HOSPITAL_COMMUNITY): Payer: Self-pay | Admitting: Emergency Medicine

## 2022-12-11 ENCOUNTER — Encounter: Payer: Self-pay | Admitting: *Deleted

## 2022-12-11 DIAGNOSIS — Z7951 Long term (current) use of inhaled steroids: Secondary | ICD-10-CM | POA: Insufficient documentation

## 2022-12-11 DIAGNOSIS — Z79899 Other long term (current) drug therapy: Secondary | ICD-10-CM | POA: Insufficient documentation

## 2022-12-11 DIAGNOSIS — Z7902 Long term (current) use of antithrombotics/antiplatelets: Secondary | ICD-10-CM | POA: Diagnosis not present

## 2022-12-11 DIAGNOSIS — N184 Chronic kidney disease, stage 4 (severe): Secondary | ICD-10-CM | POA: Diagnosis not present

## 2022-12-11 DIAGNOSIS — J449 Chronic obstructive pulmonary disease, unspecified: Secondary | ICD-10-CM | POA: Insufficient documentation

## 2022-12-11 DIAGNOSIS — I2510 Atherosclerotic heart disease of native coronary artery without angina pectoris: Secondary | ICD-10-CM | POA: Insufficient documentation

## 2022-12-11 DIAGNOSIS — I130 Hypertensive heart and chronic kidney disease with heart failure and stage 1 through stage 4 chronic kidney disease, or unspecified chronic kidney disease: Secondary | ICD-10-CM | POA: Diagnosis not present

## 2022-12-11 DIAGNOSIS — Z8616 Personal history of COVID-19: Secondary | ICD-10-CM | POA: Diagnosis not present

## 2022-12-11 DIAGNOSIS — Z743 Need for continuous supervision: Secondary | ICD-10-CM | POA: Diagnosis not present

## 2022-12-11 DIAGNOSIS — Z87891 Personal history of nicotine dependence: Secondary | ICD-10-CM | POA: Insufficient documentation

## 2022-12-11 DIAGNOSIS — I509 Heart failure, unspecified: Secondary | ICD-10-CM | POA: Diagnosis not present

## 2022-12-11 DIAGNOSIS — R062 Wheezing: Secondary | ICD-10-CM | POA: Diagnosis not present

## 2022-12-11 DIAGNOSIS — D472 Monoclonal gammopathy: Secondary | ICD-10-CM

## 2022-12-11 DIAGNOSIS — R0789 Other chest pain: Secondary | ICD-10-CM | POA: Diagnosis not present

## 2022-12-11 DIAGNOSIS — I209 Angina pectoris, unspecified: Secondary | ICD-10-CM | POA: Insufficient documentation

## 2022-12-11 DIAGNOSIS — D509 Iron deficiency anemia, unspecified: Secondary | ICD-10-CM

## 2022-12-11 DIAGNOSIS — Z85118 Personal history of other malignant neoplasm of bronchus and lung: Secondary | ICD-10-CM | POA: Insufficient documentation

## 2022-12-11 DIAGNOSIS — I499 Cardiac arrhythmia, unspecified: Secondary | ICD-10-CM | POA: Diagnosis not present

## 2022-12-11 DIAGNOSIS — I2089 Other forms of angina pectoris: Secondary | ICD-10-CM

## 2022-12-11 DIAGNOSIS — Z7982 Long term (current) use of aspirin: Secondary | ICD-10-CM | POA: Insufficient documentation

## 2022-12-11 DIAGNOSIS — R6889 Other general symptoms and signs: Secondary | ICD-10-CM | POA: Diagnosis not present

## 2022-12-11 DIAGNOSIS — R079 Chest pain, unspecified: Secondary | ICD-10-CM | POA: Diagnosis not present

## 2022-12-11 DIAGNOSIS — J984 Other disorders of lung: Secondary | ICD-10-CM | POA: Diagnosis not present

## 2022-12-11 LAB — COMPREHENSIVE METABOLIC PANEL
ALT: 25 U/L (ref 0–44)
AST: 33 U/L (ref 15–41)
Albumin: 2.8 g/dL — ABNORMAL LOW (ref 3.5–5.0)
Alkaline Phosphatase: 48 U/L (ref 38–126)
Anion gap: 8 (ref 5–15)
BUN: 46 mg/dL — ABNORMAL HIGH (ref 8–23)
CO2: 26 mmol/L (ref 22–32)
Calcium: 8.7 mg/dL — ABNORMAL LOW (ref 8.9–10.3)
Chloride: 102 mmol/L (ref 98–111)
Creatinine, Ser: 1.39 mg/dL — ABNORMAL HIGH (ref 0.44–1.00)
GFR, Estimated: 37 mL/min — ABNORMAL LOW (ref >60)
Glucose, Bld: 114 mg/dL — ABNORMAL HIGH (ref 70–99)
Potassium: 3.7 mmol/L (ref 3.5–5.1)
Sodium: 136 mmol/L (ref 135–145)
Total Bilirubin: 0.5 mg/dL (ref 0.3–1.2)
Total Protein: 8.1 g/dL (ref 6.5–8.1)

## 2022-12-11 LAB — CBC WITH DIFFERENTIAL/PLATELET
Abs Immature Granulocytes: 0 10*3/uL (ref 0.00–0.07)
Basophils Absolute: 0 10*3/uL (ref 0.0–0.1)
Basophils Relative: 0 %
Eosinophils Absolute: 0 10*3/uL (ref 0.0–0.5)
Eosinophils Relative: 1 %
HCT: 26.6 % — ABNORMAL LOW (ref 36.0–46.0)
Hemoglobin: 8.5 g/dL — ABNORMAL LOW (ref 12.0–15.0)
Immature Granulocytes: 0 %
Lymphocytes Relative: 17 %
Lymphs Abs: 0.4 10*3/uL — ABNORMAL LOW (ref 0.7–4.0)
MCH: 30.6 pg (ref 26.0–34.0)
MCHC: 32 g/dL (ref 30.0–36.0)
MCV: 95.7 fL (ref 80.0–100.0)
Monocytes Absolute: 0.2 10*3/uL (ref 0.1–1.0)
Monocytes Relative: 7 %
Neutro Abs: 1.8 10*3/uL (ref 1.7–7.7)
Neutrophils Relative %: 75 %
Platelets: 133 10*3/uL — ABNORMAL LOW (ref 150–400)
RBC: 2.78 MIL/uL — ABNORMAL LOW (ref 3.87–5.11)
RDW: 13.6 % (ref 11.5–15.5)
WBC: 2.4 10*3/uL — ABNORMAL LOW (ref 4.0–10.5)
nRBC: 0 % (ref 0.0–0.2)

## 2022-12-11 LAB — TROPONIN I (HIGH SENSITIVITY)
Troponin I (High Sensitivity): 21 ng/L — ABNORMAL HIGH (ref <18)
Troponin I (High Sensitivity): 36 ng/L — ABNORMAL HIGH (ref <18)

## 2022-12-11 LAB — BRAIN NATRIURETIC PEPTIDE: B Natriuretic Peptide: 498 pg/mL — ABNORMAL HIGH (ref 0.0–100.0)

## 2022-12-11 MED ORDER — ISOSORBIDE MONONITRATE ER 60 MG PO TB24
90.0000 mg | ORAL_TABLET | Freq: Once | ORAL | Status: AC
  Administered 2022-12-11: 90 mg via ORAL
  Filled 2022-12-11: qty 1

## 2022-12-11 MED ORDER — METOPROLOL TARTRATE 25 MG PO TABS
25.0000 mg | ORAL_TABLET | Freq: Once | ORAL | Status: AC
  Administered 2022-12-11: 25 mg via ORAL
  Filled 2022-12-11: qty 1

## 2022-12-11 NOTE — Discharge Instructions (Signed)
It was a pleasure caring for you today in the emergency department.  Please return to the emergency department for any worsening or worrisome symptoms.  Please follow-up with your primary care specialist and your cardiologist.  Please return for any worsening worrisome symptoms  Please take your medications as prescribed

## 2022-12-11 NOTE — ED Notes (Signed)
Patient and family expressed understanding of dc charge instructions and verbalized they had no further questions. Pt was given peri care, a fresh brief and clean dry paper scrubs at the time of discharge. Patient is alert and oriented at baseline. Pt was escorted out in a wheelchair by a member of staff with her niece present. Pt was given graham crackers and a ginger ale at the time of discharge.
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
## 2022-12-11 NOTE — ED Provider Notes (Signed)
[place] EMERGENCY DEPARTMENT AT [facility] Provider Note  CSN: [id number] Arrival date & time: 12/11/22 0730  Chief Complaint(s) Chest Pain  HPI Lisa [name] is a 88 y.o. female with past medical history as below, significant for CKD, HTN, CAD, COPD who presents to the ED with complaint of cp. Seen 6/21 with similar.  Woke up this morning with chest pain, chest tightness, nausea, felt hot and sweaty.  Took nitroglycerin which did alleviate her symptoms.  The symptoms unfortunately returned around 30 minutes later and EMS was dispatched.  EMS gave her nitroglycerin and aspirin which again alleviate her symptoms.  Symptoms described as mild at this point.  No difficulty breathing or chest pain that is continued.  She has been compliant with home medications, has not started increased dose of Imdur as is waiting on prescription delivery.  She was seen last month with similar complaints opted for conservative management at home.  She has been compliant with home medications, no change in bowel or bathroom habits.  No fevers.  No falls.  Family bedside  Past Medical History Past Medical History:  Diagnosis Date   Anemia    Carotid artery stenosis    CKD (chronic kidney disease)    COPD (chronic obstructive pulmonary disease) (HCC)    Dyspnea    Due to COPD per patient   Goiter diffuse, nontoxic    Per patient   Hepatitis C    History of radiation therapy    Left lung-10/10/21-10/17/21- Dr. Antony Blackbird   Hypertension    Monoclonal gammopathy of unknown significance (MGUS)    Pyelonephritis 04/06/2014   UTI   Patient Active Problem List   Diagnosis Date Noted   CAD (coronary artery disease) 12/08/2022   Impaired hearing 11/24/2022   Overgrown toenails 11/24/2022   Goals of care, counseling/discussion 11/24/2022   Acute congestive heart failure (HCC) 11/18/2022   Acute on chronic heart failure with preserved ejection fraction (HCC) 11/17/2022   Acute hypoxic  respiratory failure (HCC) 11/15/2022   Acute respiratory distress 11/15/2022   Need for pneumococcal 20-valent conjugate vaccination 10/16/2022   Dysmetria 09/04/2022   Encounter for general adult medical examination with abnormal findings 09/04/2022   COVID-19 12/29/2021   COVID-19 virus infection 12/28/2021   Elevated brain natriuretic peptide (BNP) level 12/28/2021   Elevated troponin 12/28/2021   Chronic kidney disease, stage 3b (HCC) 12/28/2021   FTT (failure to thrive) in adult 12/28/2021   Underweight 12/28/2021   Pain and swelling of forearm, left 12/28/2021   COPD with acute exacerbation (HCC) 12/27/2021   Malignant neoplasm of upper lobe of left lung (HCC) 09/15/2021   Adenocarcinoma of left lung (HCC) 09/11/2021   Multinodular goiter 09/05/2021   Odynophagia 09/03/2021   Mass of upper lobe of left lung 09/03/2021   Hypertensive urgency 09/02/2021   Chest pain 09/02/2021   CKD (chronic kidney disease) stage 4, GFR 15-29 ml/min (HCC) 09/02/2021   Tobacco abuse 09/02/2021   Myeloma (HCC) 09/02/2021   Non-ST elevation (NSTEMI) myocardial infarction [facility])    Demand ischemia    Iron deficiency anemia 07/29/2021   Anemia due to stage 4 chronic kidney disease (HCC) 07/22/2021   Chronic hepatitis C (HCC) 07/22/2021   Smoldering multiple myeloma 06/21/2021   Abnormal results of liver function studies 04/19/2021   Calculus of gallbladder without cholecystitis without obstruction    Gallstone pancreatitis 09/13/2018   Protein-calorie malnutrition, severe (HCC) 05/09/2014   Sepsis (HCC) 05/07/2014   Encephalopathy 05/07/2014  Back pain 05/07/2014   Bacteremia    COLD (chronic obstructive lung disease) (HCC)    Viridans streptococci infection    H/O goiter 04/10/2014   Moderate protein-calorie malnutrition (HCC) 04/10/2014   Acute encephalopathy 04/09/2014   Streptococcus viridans infection 04/08/2014   Leukocytosis 04/07/2014   Dehydration 04/07/2014   Fever 04/06/2014    UTI (lower urinary tract infection) 04/06/2014   ARF (acute renal failure) (HCC) 04/06/2014   Acute nontraumatic kidney injury (HCC) 04/06/2014   Hypertension    COPD (chronic obstructive pulmonary disease) (HCC)    Home Medication(s) Prior to Admission medications   Medication Sig Start Date End Date Taking? Authorizing Provider  amLODipine (NORVASC) 5 MG tablet Take 1 tablet (5 mg total) by mouth daily. 11/21/22   Johnson, Clanford L, MD  aspirin 81 MG EC tablet Take 81 mg by mouth in the morning.    [provider]  calcium carbonate (OS-CAL) 1250 (500 Ca) MG chewable tablet Chew 1 tablet by mouth daily.    [provider]  Cholecalciferol (VITAMIN D3) 125 MCG (5000 UT) CAPS Take 5,000 Units by mouth daily.    [provider]  clopidogrel (PLAVIX) 75 MG tablet Take 1 tablet (75 mg total) by mouth daily. 11/22/22   Johnson, Clanford L, MD  feeding supplement (ENSURE ENLIVE / ENSURE PLUS) LIQD Take 237 mLs by mouth 2 (two) times daily between meals. 12/31/21   Sherryll Burger, Pratik D, DO  furosemide (LASIX) 20 MG tablet Take 1 tablet (20 mg total) by mouth daily. 12/08/22 03/08/23  Strader, Lennart Pall, PA-C  Ipratropium-Albuterol (COMBIVENT RESPIMAT) 20-100 MCG/ACT AERS respimat Inhale 1 puff into the lungs every 6 (six) hours as needed for wheezing or shortness of breath. 12/31/21   Sherryll Burger, Pratik D, DO  ipratropium-albuterol (DUONEB) 0.5-2.5 (3) MG/3ML SOLN Take 3 mLs by nebulization every 4 (four) hours as needed (wheezing/shortness of breath).    [provider]  Iron, Ferrous Sulfate, 325 (65 Fe) MG TABS Take 1 tablet by mouth daily.    [provider]  isosorbide mononitrate (IMDUR) 60 MG 24 hr tablet Take 1.5 tablets (90 mg total) by mouth daily. 12/08/22 01/07/23  [name], MD  metoprolol tartrate (LOPRESSOR) 25 MG tablet Take 1 tablet (25 mg total) by mouth 2 (two) times daily. 11/21/22   Cleora Fleet, MD  Multiple Vitamin (MULTIVITAMIN WITH  MINERALS) TABS tablet Take 1 tablet by mouth daily. 11/22/22   Johnson, Clanford L, MD  nitroGLYCERIN (NITROSTAT) 0.4 MG SL tablet Place 1 tablet (0.4 mg total) under the tongue every 5 (five) minutes as needed for chest pain. 09/06/21 12/08/22  Pokhrel, Rebekah Chesterfield, MD  ondansetron (ZOFRAN) 4 MG tablet Take 1 tablet (4 mg total) by mouth every 6 (six) hours as needed for nausea. 09/06/21   Pokhrel, Rebekah Chesterfield, MD  rosuvastatin (CRESTOR) 20 MG tablet Take 1 tablet (20 mg total) by mouth daily at 6 PM. 11/21/22   Cleora Fleet, MD  Past Surgical History Past Surgical History:  Procedure Laterality Date   ABDOMINAL HYSTERECTOMY     CHOLECYSTECTOMY N/A 09/17/2018   Procedure: LAPAROSCOPIC CHOLECYSTECTOMY;  Surgeon: Lucretia Roers, MD;  Location: [facility];  Service: General;  Laterality: N/A;   FUDUCIAL PLACEMENT Left 09/01/2021   Procedure: PLACEMENT OF FIDUCIAL MARKERS TIMES FOUR;  Surgeon: Loreli Slot, MD;  Location: [facility] OR;  Service: Thoracic;  Laterality: Left;   TEE WITHOUT CARDIOVERSION N/A 04/10/2014   Procedure: TRANSESOPHAGEAL ECHOCARDIOGRAM (TEE);  Surgeon: Donato Schultz, MD;  Location: [facility] ENDOSCOPY;  Service: Cardiovascular;  Laterality: N/A;   VIDEO BRONCHOSCOPY WITH ENDOBRONCHIAL NAVIGATION N/A 09/01/2021   Procedure: VIDEO BRONCHOSCOPY WITH ENDOBRONCHIAL NAVIGATION;  Surgeon: Loreli Slot, MD;  Location: [facility] OR;  Service: Thoracic;  Laterality: N/A;   Family History Family History  Problem Relation Age of Onset   Hypertension Mother    Thyroid disease Mother    Hypertension Father    Thyroid disease Sister    Thyroid disease Brother    Thyroid disease Sister     Social History Social History   Tobacco Use   Smoking status: Former    Packs/day: .25    Types: Cigarettes    Passive exposure: Current   Smokeless tobacco: Never   Tobacco  comments:    Smoking Cessation Classes, Services, Agencies & Resources Offered.  Vaping Use   Vaping Use: Never used  Substance Use Topics   Alcohol use: No   Drug use: No   Allergies Patient has no known allergies.  Review of Systems Review of Systems  Constitutional:  Positive for fatigue. Negative for chills and fever.  HENT:  Negative for facial swelling and trouble swallowing.   Eyes:  Negative for photophobia and visual disturbance.  Respiratory:  Positive for chest tightness. Negative for cough and shortness of breath.   Cardiovascular:  Positive for chest pain and palpitations.  Gastrointestinal:  Positive for nausea. Negative for abdominal pain and vomiting.  Endocrine: Negative for polydipsia and polyuria.  Genitourinary:  Negative for difficulty urinating and hematuria.  Musculoskeletal:  Negative for gait problem and joint swelling.  Skin:  Negative for pallor and rash.  Neurological:  Positive for light-headedness. Negative for syncope and headaches.  Psychiatric/Behavioral:  Negative for agitation and confusion.     Physical Exam Vital Signs  I have reviewed the triage vital signs BP (!) 146/60   Pulse (!) 55   Temp 97.9 F (36.6 C) (Oral)   Resp 20   Ht 5\' 7"  (1.702 m)   Wt 45.8 kg   SpO2 99%   BMI 15.82 kg/m  Physical Exam Vitals and nursing note reviewed. Exam conducted with a chaperone present.  Constitutional:      General: She is not in acute distress.    Appearance: Normal appearance.     Comments: frail  HENT:     Head: Normocephalic and atraumatic.     Right Ear: External ear normal.     Left Ear: External ear normal.     Nose: Nose normal.     Mouth/Throat:     Mouth: Mucous membranes are moist.  Eyes:     General: No scleral icterus.       Right eye: No discharge.        Left eye: No discharge.  Cardiovascular:     Rate and Rhythm: Normal rate and regular rhythm.     Pulses: Normal pulses.     Heart sounds: Normal heart sounds.  Pulmonary:     Effort: Pulmonary effort is normal. No respiratory distress.     Breath sounds: Wheezing present.  Abdominal:     General: Abdomen is flat.     Palpations: Abdomen is soft.     Tenderness: There is no abdominal tenderness.  Musculoskeletal:        General: Normal range of motion.     Cervical back: No rigidity.     Right lower leg: No edema.     Left lower leg: No edema.  Skin:    General: Skin is warm and dry.     Capillary Refill: Capillary refill takes less than 2 seconds.     Coloration: Skin is not jaundiced.  Neurological:     Mental Status: She is alert.  Psychiatric:        Mood and Affect: Mood normal.        Behavior: Behavior normal.     ED Results and Treatments Labs (all labs ordered are listed, but only abnormal results are displayed) Labs Reviewed  CBC WITH DIFFERENTIAL/PLATELET - Abnormal; Notable for the following components:      Result Value   WBC 2.4 (*)    RBC 2.78 (*)    Hemoglobin 8.5 (*)    HCT 26.6 (*)    Platelets 133 (*)    Lymphs Abs 0.4 (*)    All other components within normal limits  BRAIN NATRIURETIC PEPTIDE - Abnormal; Notable for the following components:   B Natriuretic Peptide 498.0 (*)    All other components within normal limits  COMPREHENSIVE METABOLIC PANEL - Abnormal; Notable for the following components:   Glucose, Bld 114 (*)    BUN 46 (*)    Creatinine, Ser 1.39 (*)    Calcium 8.7 (*)    Albumin 2.8 (*)    GFR, Estimated 37 (*)    All other components within normal limits  TROPONIN I (HIGH SENSITIVITY) - Abnormal; Notable for the following components:   Troponin I (High Sensitivity) 21 (*)    All other components within normal limits  TROPONIN I (HIGH SENSITIVITY) - Abnormal; Notable for the following components:   Troponin I (High Sensitivity) 36 (*)    All other components within normal limits                                                                                                                           Radiology DG Chest Port 1 View  Result Date: 12/11/2022 CLINICAL DATA:  88 year old female with chest pain, recent MI. History of lung cancer EXAM: PORTABLE CHEST 1 VIEW COMPARISON:  Portable chest 12/01/2022 and earlier. FINDINGS: Portable AP upright view at 0758 hours. Chronic left apical lung lesion with surgical clips and/or markers (see chest CT 11/29/2022). Stable lung volumes and mediastinal contours. No pneumothorax, pulmonary edema, pleural effusion, or new pulmonary opacity. Stable visualized osseous structures. Negative visible bowel gas. Stable cholecystectomy clips. IMPRESSION: 1. Known left lung lesion, re-staged by  CT on 11/29/2022 (please see that report). 2. No new cardiopulmonary abnormality. Electronically Signed   By: Odessa Fleming M.D.   On: 12/11/2022 08:20    Pertinent labs & imaging results that were available during my care of the patient were reviewed by me and considered in my medical decision making (see MDM for details).  Medications Ordered in ED Medications  metoprolol tartrate (LOPRESSOR) tablet 25 mg (25 mg Oral Given 12/11/22 0825)  isosorbide mononitrate (IMDUR) 24 hr tablet 90 mg (90 mg Oral Given 12/11/22 0826)                                                                                                                                     Procedures Procedures  (including critical care time)  Medical Decision Making / ED Course    Medical Decision Making:    Manveer Bisset is a 88 y.o. female history of CAD, CKD, HTN here with complaint of chest pain. The complaint involves an extensive differential diagnosis and also carries with it a high risk of complications and morbidity.  Serious etiology was considered. Ddx includes but is not limited to: Differential includes all life-threatening causes for chest pain. This includes but is not exclusive to acute coronary syndrome, aortic dissection, pulmonary embolism, cardiac tamponade, community-acquired  pneumonia, pericarditis, musculoskeletal chest wall pain, etc.   Complete initial physical exam performed, notably the patient  was blood pressure elevated, no current chest pain.    Reviewed and confirmed nursing documentation for past medical history, family history, social history.  Vital signs reviewed.    Clinical Course as of 12/11/22 1353  Mon Dec 11, 2022  9528 Given ASA 325mg  by EMS pta [SG]  0815 Bp elevated, did not take home meds today, will give and recheck [SG]  0816 Hemoglobin(!): 8.5 Similar to prior  [SG]  0842 Creatinine(!): 1.39 Improved from prior [SG]  1341 On recheck she remains asymptomatic, at this time she is still not interested in heart cath or invasive cardiac testing.  She will follow-up with her cardiologist for recheck.  Medication management.  She will start taking her new dose of Imdur [SG]    Clinical Course User Index [SG] Sloan Leiter, DO   Seen 6/21 with similar complaint. Pt/family opted to refrain from any invasive cardiac testing per EDP documentation. She saw cardiology 6/28. Her IMDUR was adjusted to 90mg  daily, she has not started this yet as waiting rx delivery. Did not take her morning medication  She remains asymptomatic.  Has not had recurrence of chest pain for the duration of her stay in the emergency department.  Troponin mildly elevated but flat.  EKG similar to prior.  Labs stable compared to her baseline  Discussed again possibility of invasive cardiac testing including heart cath to which patient is still not interested in pursuing this.  She will follow back up with her cardiologist for recheck  and medication management.  Advised her to start taking her new dose of Imdur as prescribed  Symptoms today likely anginal in nature given resolution with nitroglycerin.  I offered to call her cardiology team to discuss further management to which she prefers that I do not do this.  She is requesting to go home.  Observation was also  offered to which she would prefer to go home and follow-up with her doctor in the office.    The patient improved significantly and was discharged in stable condition. Detailed discussions were had with the patient regarding current findings, and need for close f/u with PCP or on call doctor. The patient has been instructed to return immediately if the symptoms worsen in any way for re-evaluation. Patient verbalized understanding and is in agreement with current care plan. All questions answered prior to discharge.      Additional history obtained: -Additional history obtained from family -External records from outside source obtained and reviewed including: Chart review including previous notes, labs, imaging, consultation notes including recent ED visit, recent cardiology visit, home meds   Lab Tests: -I ordered, reviewed, and interpreted labs.   The pertinent results include:   Labs Reviewed  CBC WITH DIFFERENTIAL/PLATELET - Abnormal; Notable for the following components:      Result Value   WBC 2.4 (*)    RBC 2.78 (*)    Hemoglobin 8.5 (*)    HCT 26.6 (*)    Platelets 133 (*)    Lymphs Abs 0.4 (*)    All other components within normal limits  BRAIN NATRIURETIC PEPTIDE - Abnormal; Notable for the following components:   B Natriuretic Peptide 498.0 (*)    All other components within normal limits  COMPREHENSIVE METABOLIC PANEL - Abnormal; Notable for the following components:   Glucose, Bld 114 (*)    BUN 46 (*)    Creatinine, Ser 1.39 (*)    Calcium 8.7 (*)    Albumin 2.8 (*)    GFR, Estimated 37 (*)    All other components within normal limits  TROPONIN I (HIGH SENSITIVITY) - Abnormal; Notable for the following components:   Troponin I (High Sensitivity) 21 (*)    All other components within normal limits  TROPONIN I (HIGH SENSITIVITY) - Abnormal; Notable for the following components:   Troponin I (High Sensitivity) 36 (*)    All other components within normal limits     Notable for trop + but flat  EKG   EKG Interpretation Date/Time:  Monday December 11 2022 07:41:01 EDT Ventricular Rate:  83 PR Interval:  122 QRS Duration:  96 QT Interval:  352 QTC Calculation: 414 R Axis:   76  Text Interpretation: Sinus rhythm Probable LVH with secondary repol abnrm Repol abnrm, severe global ischemia (LM/MVD) similar to prior 12/01/22 Confirmed by Tanda Rockers ([id number]) on 12/11/2022 7:59:42 AM         Imaging Studies ordered: I ordered imaging studies including CXR I independently visualized the following imaging with scope of interpretation limited to determining acute life threatening conditions related to emergency care; findings noted above, significant for stable imaging I independently visualized and interpreted imaging. I agree with the radiologist interpretation   Medicines ordered and prescription drug management: Meds ordered this encounter  Medications   metoprolol tartrate (LOPRESSOR) tablet 25 mg   isosorbide mononitrate (IMDUR) 24 hr tablet 90 mg    -I have reviewed the patients home medicines and have made adjustments as needed   Consultations Obtained: na  Cardiac Monitoring: The patient was maintained on a cardiac monitor.  I personally viewed and interpreted the cardiac monitored which showed an underlying rhythm of: NSR3  Social Determinants of Health:  Diagnosis or treatment significantly limited by social determinants of health: former smoker   Reevaluation: After the interventions noted above, I reevaluated the patient and found that they have resolved  Co morbidities that complicate the patient evaluation  Past Medical History:  Diagnosis Date   Anemia    Carotid artery stenosis    CKD (chronic kidney disease)    COPD (chronic obstructive pulmonary disease) (HCC)    Dyspnea    Due to COPD per patient   Goiter diffuse, nontoxic    Per patient   Hepatitis C    History of radiation therapy    Left  lung-10/10/21-10/17/21- Dr. Antony Blackbird   Hypertension    Monoclonal gammopathy of unknown significance (MGUS)    Pyelonephritis 04/06/2014   UTI      Dispostion: Disposition decision including need for hospitalization was considered, and patient discharged from emergency department.    Final Clinical Impression(s) / ED Diagnoses Final diagnoses:  Anginal chest pain at rest     This chart was dictated using voice recognition software.  Despite best efforts to proofread,  errors can occur which can change the documentation meaning.    Tanda Rockers A, DO 12/11/22 1353

## 2022-12-11 NOTE — ED Triage Notes (Signed)
Pt called ems with complaints of chest pain. Pt stated she had a heart attack last week. Due to ARDS treatment only consisted of a rx of nitroglycerin. Pt woke up with CP this am. She took one nitroglycerin and called EMS. EMS found her systolic was slightly elevated and she was in AFIB. Pt is currently in NSR. EMS gave pt 1 nitroglycerin and 324 asa. Pt normally takes a blood thinner but has not taken this morning. Pt currently rates her chest pain at a 4/10.

## 2022-12-11 NOTE — Patient Outreach (Signed)
Care Coordination   Follow Up Visit Note   12/11/2022  Name: Lisa [name] MRN: [id number] DOB: 1934-10-10  Lisa [name] is a 88 y.o. year old female who sees Durwin Nora, Lucina Mellow, MD for primary care. I spoke with Janan Ridge by phone today.  What matters to the patients health and wellness today?  Find Help in My Community.   Goals Addressed             This Visit's Progress    Find Help in My Community.   On track    Care Coordination Interventions:  Interventions Today    Flowsheet Row Most Recent Value  Chronic Disease   Chronic disease during today's visit Chronic Obstructive Pulmonary Disease (COPD), Chronic Kidney Disease/End Stage Renal Disease (ESRD), Hypertension (HTN), Other  [Tobacco Abuse]  General Interventions   General Interventions Discussed/Reviewed --  [Encouraged]  Labs Hgb A1c annually  [Encouraged]  Vaccines COVID-19, Flu, Pneumonia, RSV, Shingles, Tetanus/Pertussis/Diphtheria  [Encouraged]  Doctor Visits Discussed/Reviewed Doctor Visits Discussed, Doctor Visits Reviewed, Annual Wellness Visits, PCP, Specialist  [Encouraged]  Health Screening Bone Density, Colonoscopy, Mammogram  [Encouraged]  PCP/Specialist Visits Compliance with follow-up visit  [Encouraged]  Communication with PCP/Specialists  [Encouraged]  Level of Care Adult Daycare, Personal Care Services, Applications, Assisted Living, Skilled Nursing Facility  [Encouraged]  Applications Medicaid, Personal Care Services, [place]  [Encouraged]  Exercise Interventions   Exercise Discussed/Reviewed Exercise Discussed, Assistive device use and maintanence, Exercise Reviewed, Physical Activity, Weight Managment  [Encouraged]  Physical Activity Discussed/Reviewed Physical Activity Discussed, Physical Activity Reviewed, Home Exercise Program (HEP), Types of exercise  [Encouraged]  Weight Management Weight maintenance  [Encouraged]  Education Interventions   Education Provided Provided Development worker, international aid, Provided Web-based Education, Provided Education  Provided Verbal Education On Nutrition, Mental Health/Coping with Illness, When to see the doctor, Foot Care, Eye Care, Applications, Labs, Exercise, Medication, Blood Sugar Monitoring, Programmer, applications, Nurse, mental health, Personal Care Services, [place]-2  [Encouraged]  Mental Health Interventions   Mental Health Discussed/Reviewed Mental Health Discussed, Anxiety, Depression, Grief and Loss, Mental Health Reviewed, Substance Abuse, Coping Strategies, Crisis, Other, Suicide  [Domestic Violence]  Nutrition Interventions   Nutrition Discussed/Reviewed Nutrition Discussed, Adding fruits and vegetables, Increaing proteins, Decreasing fats, Decreasing salt, Supplmental nutrition, Decreasing sugar intake, Portion sizes, Fluid intake, Nutrition Reviewed, Carbohydrate meal planning  [Encouraged]  Pharmacy Interventions   Pharmacy Dicussed/Reviewed Pharmacy Topics Discussed, Medications and their functions, Medication Adherence, Pharmacy Topics Reviewed, Affording Medications  [Encouraged]  Safety Interventions   Safety Discussed/Reviewed Safety Discussed, Safety Reviewed, Home Safety  [Encouraged]  Home Safety Assistive Devices, Need for home safety assessment, Refer for community resources  [Encouraged]  Advanced Directive Interventions   Advanced Directives Discussed/Reviewed Advanced Directives Discussed  [Advanced Directives Completed]     Active Listening & Reflection Utilized.  Verbalization of Feelings Encouraged.  Emotional Support Provided. Feelings of Caregiver Burnout Validated. Caregiver Stress Acknowledged. Problem Solving Interventions Employed. Task-Centered Solutions Indicated.   Solution-Focused Strategies Activated. Acceptance & Commitment Therapy Performed. Cognitive Behavioral Therapy Initiated. Client-Centered Therapy Implemented. CSW Collaboration with Niece, [name] to Confirm  Patient's Termination of Home Health Services, through [facility] [phone]), Effective 12/07/2022. CSW Collaboration with Niece, [name] to Confirm Patient's Refusal to Allow Anyone into The Home to Provide Care, Assistance, & Supervision, of Any Kind. CSW Collaboration with Niece, [name] to Address Her Concerns Regarding Patient Possibly Mistaking Panic Attacks for Cardiac Related Issues.  CSW Collaboration  with Niece, [name] to Discuss Her Concerns Regarding Changes in Patient's Blood Pressure Medication Regimen. CSW Collaboration with Niece, [name] to Confirm Her Knowledge of Referral Placed for Palliative Care Services. CSW Collaboration with Niece, [name] to Confirm Patient's Adamant Refusal to Discuss Higher Level of Care Options, Indicating Patient's Insistence on Remaining in The Home & Living Independently. CSW Collaboration with Niece, [name] to Confirm Her Desire for CSW to [facility] Provider, Dr. Christel Mormon with [facility] [phone]), of The Following Concerns: ~ Patient's Emergency Department Visit on 12/11/2022. ~ Patient's Palliative Care Referral Placed by Inpatient Hospital Staff. ~ Patient's Termination of Home Health Services. ~ Patient's Change in Blood Pressure Medications.  CSW Collaboration with Niece, [name] to Confirm Patient's Name on Waiting List for Meals on Wheels, through ([facility]) [facility]ability[facility] [place] [phone]). CSW Collaboration with Niece, [name] to [employer] with CSW [phone]# [phone]), if She Has Questions, Needs Assistance, or If Additional Social Work Needs Are Identified Between Now & Our Next Scheduled Follow-Up Outreach Call.      SDOH assessments and interventions completed:  Yes.  Care Coordination Interventions:  Yes, provided.   Follow up plan: Follow up call scheduled for 12/25/2022 at 9:00  am.  Encounter Outcome:  Pt. Visit Completed.   Danford Bad, BSW, MSW, LCSW  Licensed Restaurant manager, [employer] [facility]  Mailing [place] [address] [place], [place] [address] Physical Address-[address] [place], [place] [address] Toll Free Main # [phone] Fax # [phone] Cell # [phone] [email or web address]

## 2022-12-11 NOTE — ED Notes (Signed)
Pt niece came to the desk and was concerned about anyone making changes to her aunts medications. She stated that the patients PCP was upset they had not been contacted. She requested the presence of the provider at that time. I assured her that the EDP would not be making significant changes to any medications and the only medications that were given was to treat HBP acutely.  Pt niece was not happy that I could not provide the provider at that time. I sent the provider a message and educated him on her concerns and her request for him at the bedside. I assured her that the provider would be more than willing to speak with her regarding the health of her aunt.

## 2022-12-12 ENCOUNTER — Encounter: Payer: Self-pay | Admitting: Internal Medicine

## 2022-12-12 ENCOUNTER — Ambulatory Visit (INDEPENDENT_AMBULATORY_CARE_PROVIDER_SITE_OTHER): Payer: Medicare Other | Admitting: Internal Medicine

## 2022-12-12 ENCOUNTER — Inpatient Hospital Stay: Payer: Medicare Other | Attending: Hematology

## 2022-12-12 VITALS — BP 162/62 | HR 66 | Ht 67.0 in | Wt 101.8 lb

## 2022-12-12 DIAGNOSIS — I25118 Atherosclerotic heart disease of native coronary artery with other forms of angina pectoris: Secondary | ICD-10-CM | POA: Diagnosis not present

## 2022-12-12 DIAGNOSIS — N189 Chronic kidney disease, unspecified: Secondary | ICD-10-CM | POA: Insufficient documentation

## 2022-12-12 DIAGNOSIS — Z7982 Long term (current) use of aspirin: Secondary | ICD-10-CM | POA: Insufficient documentation

## 2022-12-12 DIAGNOSIS — I10 Essential (primary) hypertension: Secondary | ICD-10-CM | POA: Diagnosis not present

## 2022-12-12 DIAGNOSIS — I129 Hypertensive chronic kidney disease with stage 1 through stage 4 chronic kidney disease, or unspecified chronic kidney disease: Secondary | ICD-10-CM | POA: Insufficient documentation

## 2022-12-12 DIAGNOSIS — D631 Anemia in chronic kidney disease: Secondary | ICD-10-CM | POA: Insufficient documentation

## 2022-12-12 DIAGNOSIS — D472 Monoclonal gammopathy: Secondary | ICD-10-CM | POA: Insufficient documentation

## 2022-12-12 DIAGNOSIS — Z79899 Other long term (current) drug therapy: Secondary | ICD-10-CM | POA: Insufficient documentation

## 2022-12-12 DIAGNOSIS — Z85118 Personal history of other malignant neoplasm of bronchus and lung: Secondary | ICD-10-CM | POA: Insufficient documentation

## 2022-12-12 DIAGNOSIS — Z87891 Personal history of nicotine dependence: Secondary | ICD-10-CM | POA: Insufficient documentation

## 2022-12-12 DIAGNOSIS — Z7902 Long term (current) use of antithrombotics/antiplatelets: Secondary | ICD-10-CM | POA: Insufficient documentation

## 2022-12-12 MED ORDER — ISOSORBIDE MONONITRATE ER 60 MG PO TB24
90.0000 mg | ORAL_TABLET | Freq: Every day | ORAL | 2 refills | Status: DC
Start: 2022-12-12 — End: 2023-01-23

## 2022-12-12 NOTE — Progress Notes (Signed)
Established Patient Office Visit  Subjective   Patient ID: Lisa [name], female    DOB: [birth year]  Age: 88 y.o. MRN: [id number]  Chief Complaint  Patient presents with   er follow up   [name] returns to care today for ER follow-up.  She presented to the emergency department yesterday (7/1) for chest pain.  She endorsed chest tightness which was alleviated with sublingual nitroglycerin.  Chest pain returned approximately 30 minutes later, which prompted her to call EMS.  She was transported to [facility] ED.  Hemodynamically stable on ED presentation.  ECG was unremarkable and troponins were elevated but flat.  She received nitroglycerin and ASA 324 mg.  She did not have any recurrence of chest pain.  [name] has previously declined invasive cardiac testing and treatment.  This was confirmed again by emergency department staff.  No additional medication changes were made and she was discharged home with close PCP follow-up.  Today [name] reports feeling well.  She has not had any additional episodes of chest pain since leaving the emergency department.  She does not have any additional concerns to discuss today.  Of note, Ms. Christell Constant was last evaluated by me on 6/28.  Imdur was increased to 90 mg daily at that time.  She reports today that she has not started taking Imdur at this dose because she is waiting for a prescription from the pharmacy.  Past Medical History:  Diagnosis Date   Anemia    Carotid artery stenosis    CKD (chronic kidney disease)    COPD (chronic obstructive pulmonary disease) (HCC)    Dyspnea    Due to COPD per patient   Goiter diffuse, nontoxic    Per patient   Hepatitis C    History of radiation therapy    Left lung-10/10/21-10/17/21- Dr. Antony Blackbird   Hypertension    Monoclonal gammopathy of unknown significance (MGUS)    Pyelonephritis 04/06/2014   UTI   Past Surgical History:  Procedure Laterality Date   ABDOMINAL HYSTERECTOMY      CHOLECYSTECTOMY N/A 09/17/2018   Procedure: LAPAROSCOPIC CHOLECYSTECTOMY;  Surgeon: Lucretia Roers, MD;  Location: [facility] OR[facility];  Service: General;  Laterality: N/A;   FUDUCIAL PLACEMENT Left 09/01/2021   Procedure: PLACEMENT OF FIDUCIAL MARKERS TIMES FOUR;  Surgeon: Loreli Slot, MD;  Location: [facility] OR;  Service: Thoracic;  Laterality: Left;   TEE WITHOUT CARDIOVERSION N/A 04/10/2014   Procedure: TRANSESOPHAGEAL ECHOCARDIOGRAM (TEE);  Surgeon: Donato Schultz, MD;  Location: [facility] ENDOSCOPY;  Service: Cardiovascular;  Laterality: N/A;   VIDEO BRONCHOSCOPY WITH ENDOBRONCHIAL NAVIGATION N/A 09/01/2021   Procedure: VIDEO BRONCHOSCOPY WITH ENDOBRONCHIAL NAVIGATION;  Surgeon: Loreli Slot, MD;  Location: [facility] OR;  Service: Thoracic;  Laterality: N/A;   Social History   Tobacco Use   Smoking status: Former    Packs/day: .25    Types: Cigarettes    Passive exposure: Current   Smokeless tobacco: Never   Tobacco comments:    Smoking Cessation Classes, Services, Agencies & Resources Offered.  Vaping Use   Vaping Use: Never used  Substance Use Topics   Alcohol use: No   Drug use: No   Family History  Problem Relation Age of Onset   Hypertension Mother    Thyroid disease Mother    Hypertension Father    Thyroid disease Sister    Thyroid disease Brother    Thyroid disease Sister    No Known Allergies  Review of Systems  Constitutional:  Negative for chills and fever.  HENT:  Negative for sore throat.   Respiratory:  Negative for cough and shortness of breath.   Cardiovascular:  Negative for chest pain, palpitations and leg swelling.  Gastrointestinal:  Negative for abdominal pain, blood in stool, constipation, diarrhea, nausea and vomiting.  Genitourinary:  Negative for dysuria and hematuria.  Musculoskeletal:  Negative for myalgias.  Skin:  Negative for itching and rash.  Neurological:  Negative for dizziness and headaches.  Psychiatric/Behavioral:  Negative for depression and  suicidal ideas.      Objective:     BP (!) 162/62   Pulse 66   Ht 5\' 7"  (1.702 m)   Wt 101 lb 12.8 oz (46.2 kg)   SpO2 97%   BMI 15.94 kg/m  BP Readings from Last 3 Encounters:  12/12/22 (!) 162/62  12/11/22 (!) 172/68  12/08/22 (!) 140/40   Physical Exam Vitals reviewed.  Constitutional:      General: She is not in acute distress.    Appearance: She is not toxic-appearing.     Comments: Cachectic appearance.  Examined in wheelchair.  HENT:     Head: Normocephalic and atraumatic.     Right Ear: External ear normal.     Left Ear: External ear normal.     Nose: Nose normal. No congestion or rhinorrhea.     Mouth/Throat:     Mouth: Mucous membranes are moist.     Pharynx: Oropharynx is clear. No oropharyngeal exudate or posterior oropharyngeal erythema.  Eyes:     General: No scleral icterus.    Extraocular Movements: Extraocular movements intact.     Conjunctiva/sclera: Conjunctivae normal.     Pupils: Pupils are equal, round, and reactive to light.  Cardiovascular:     Rate and Rhythm: Normal rate and regular rhythm.     Pulses: Normal pulses.     Heart sounds: Normal heart sounds. No murmur heard.    No friction rub. No gallop.  Pulmonary:     Effort: Pulmonary effort is normal.     Breath sounds: Normal breath sounds. No wheezing, rhonchi or rales.  Abdominal:     General: Abdomen is flat. Bowel sounds are normal. There is no distension.     Palpations: Abdomen is soft.     Tenderness: There is no abdominal tenderness.  Musculoskeletal:        General: No swelling. Normal range of motion.     Cervical back: Normal range of motion.     Right lower leg: No edema.     Left lower leg: No edema.  Lymphadenopathy:     Cervical: No cervical adenopathy.  Skin:    General: Skin is warm and dry.     Capillary Refill: Capillary refill takes less than 2 seconds.     Coloration: Skin is not jaundiced.  Neurological:     General: No focal deficit present.     Mental  Status: She is alert and oriented to person, place, and time.  Psychiatric:        Mood and Affect: Mood normal.        Behavior: Behavior normal.   Last CBC Lab Results  Component Value Date   WBC 2.4 (L) 12/11/2022   HGB 8.5 (L) 12/11/2022   HCT 26.6 (L) 12/11/2022   MCV 95.7 12/11/2022   MCH 30.6 12/11/2022   RDW 13.6 12/11/2022   PLT 133 (L) 12/11/2022   Last metabolic panel Lab Results  Component Value Date   GLUCOSE  114 (H) 12/11/2022   NA 136 12/11/2022   K 3.7 12/11/2022   CL 102 12/11/2022   CO2 26 12/11/2022   BUN 46 (H) 12/11/2022   CREATININE 1.39 (H) 12/11/2022   GFRNONAA 37 (L) 12/11/2022   CALCIUM 8.7 (L) 12/11/2022   PHOS 2.5 11/21/2022   PROT 8.1 12/11/2022   ALBUMIN 2.8 (L) 12/11/2022   LABGLOB 4.1 (H) 11/29/2022   AGRATIO 0.8 11/29/2022   BILITOT 0.5 12/11/2022   ALKPHOS 48 12/11/2022   AST 33 12/11/2022   ALT 25 12/11/2022   ANIONGAP 8 12/11/2022   Last lipids Lab Results  Component Value Date   CHOL 127 11/17/2022   HDL 43 11/17/2022   LDLCALC 69 11/17/2022   TRIG 77 11/17/2022   CHOLHDL 3.0 11/17/2022   Last thyroid functions Lab Results  Component Value Date   TSH 1.140 04/09/2014   Last vitamin B12 and Folate Lab Results  Component Value Date   VITAMINB12 430 11/29/2022   FOLATE 17.0 05/29/2022     Assessment & Plan:   Problem List Items Addressed This Visit       CAD (coronary artery disease) - Primary    She returns to care today for ER follow-up in the setting of recent ER presentation for chest pain.  ED presentation 7/1 due to recurrent chest pain that initially resolved with sublingual nitroglycerin but returned 30 minutes later.  She was hemodynamically stable on ED presentation.  ECG was without acute ischemic changes.  Troponins were elevated but flat.  Treated with additional ASA and nitroglycerin.  No recurrence of chest pain.  She again declined invasive cardiac testing and treatment.  No additional medication  changes are made. -No additional medication changes have been made today.  I recommended that Ms. Davis start Imdur 90 mg as recently prescribed.  She was evaluated by cardiology on 6/28, who was in agreement with this plan.  She is not scheduled to see cardiology again until October.  I recommended that she contact her cardiologist to arrange follow-up if she continues to have recurrent episodes of chest pain.      Return if symptoms worsen or fail to improve.   [name], MD

## 2022-12-12 NOTE — Patient Instructions (Signed)
It was a pleasure to see you today.  Thank you for giving [place] the opportunity to be involved in your care.  Below is a brief recap of your visit and next steps.  We will plan to see you again in August.  Summary No medication changes today Start Imdur 90 mg daily as recently prescribed We will plan for follow up as previously scheduled in early August.

## 2022-12-12 NOTE — Assessment & Plan Note (Signed)
She returns to care today for ER follow-up in the setting of recent ER presentation for chest pain.  ED presentation 7/1 due to recurrent chest pain that initially resolved with sublingual nitroglycerin but returned 30 minutes later.  She was hemodynamically stable on ED presentation.  ECG was without acute ischemic changes.  Troponins were elevated but flat.  Treated with additional ASA and nitroglycerin.  No recurrence of chest pain.  She again declined invasive cardiac testing and treatment.  No additional medication changes are made. -No additional medication changes have been made today.  I recommended that Lisa [name] start Imdur 90 mg as recently prescribed.  She was evaluated by cardiology on 6/28, who was in agreement with this plan.  She is not scheduled to see cardiology again until October.  I recommended that she contact her cardiologist to arrange follow-up if she continues to have recurrent episodes of chest pain.

## 2022-12-13 ENCOUNTER — Inpatient Hospital Stay: Payer: Medicare Other

## 2022-12-16 ENCOUNTER — Encounter (HOSPITAL_COMMUNITY): Payer: Self-pay

## 2022-12-16 ENCOUNTER — Other Ambulatory Visit: Payer: Self-pay

## 2022-12-16 ENCOUNTER — Inpatient Hospital Stay (HOSPITAL_COMMUNITY)
Admission: EM | Admit: 2022-12-16 | Discharge: 2022-12-19 | DRG: 281 | Disposition: A | Discharge status: Home Health | Payer: Medicare Other | Attending: Internal Medicine | Admitting: Internal Medicine

## 2022-12-16 ENCOUNTER — Emergency Department (HOSPITAL_COMMUNITY): Payer: Medicare Other

## 2022-12-16 DIAGNOSIS — Z79899 Other long term (current) drug therapy: Secondary | ICD-10-CM | POA: Diagnosis not present

## 2022-12-16 DIAGNOSIS — Z8619 Personal history of other infectious and parasitic diseases: Secondary | ICD-10-CM

## 2022-12-16 DIAGNOSIS — D631 Anemia in chronic kidney disease: Secondary | ICD-10-CM | POA: Diagnosis present

## 2022-12-16 DIAGNOSIS — Z87440 Personal history of urinary (tract) infections: Secondary | ICD-10-CM

## 2022-12-16 DIAGNOSIS — I10 Essential (primary) hypertension: Secondary | ICD-10-CM | POA: Diagnosis present

## 2022-12-16 DIAGNOSIS — Z8349 Family history of other endocrine, nutritional and metabolic diseases: Secondary | ICD-10-CM

## 2022-12-16 DIAGNOSIS — E049 Nontoxic goiter, unspecified: Secondary | ICD-10-CM | POA: Diagnosis present

## 2022-12-16 DIAGNOSIS — N184 Chronic kidney disease, stage 4 (severe): Secondary | ICD-10-CM | POA: Diagnosis not present

## 2022-12-16 DIAGNOSIS — Z923 Personal history of irradiation: Secondary | ICD-10-CM | POA: Diagnosis not present

## 2022-12-16 DIAGNOSIS — I214 Non-ST elevation (NSTEMI) myocardial infarction: Principal | ICD-10-CM | POA: Diagnosis present

## 2022-12-16 DIAGNOSIS — Z87891 Personal history of nicotine dependence: Secondary | ICD-10-CM | POA: Diagnosis not present

## 2022-12-16 DIAGNOSIS — I700 Atherosclerosis of aorta: Secondary | ICD-10-CM | POA: Diagnosis not present

## 2022-12-16 DIAGNOSIS — Z8249 Family history of ischemic heart disease and other diseases of the circulatory system: Secondary | ICD-10-CM | POA: Diagnosis not present

## 2022-12-16 DIAGNOSIS — R0689 Other abnormalities of breathing: Secondary | ICD-10-CM | POA: Diagnosis not present

## 2022-12-16 DIAGNOSIS — C801 Malignant (primary) neoplasm, unspecified: Secondary | ICD-10-CM | POA: Diagnosis not present

## 2022-12-16 DIAGNOSIS — Z681 Body mass index (BMI) 19 or less, adult: Secondary | ICD-10-CM

## 2022-12-16 DIAGNOSIS — J449 Chronic obstructive pulmonary disease, unspecified: Secondary | ICD-10-CM | POA: Diagnosis not present

## 2022-12-16 DIAGNOSIS — R0603 Acute respiratory distress: Secondary | ICD-10-CM | POA: Diagnosis not present

## 2022-12-16 DIAGNOSIS — D649 Anemia, unspecified: Secondary | ICD-10-CM | POA: Diagnosis not present

## 2022-12-16 DIAGNOSIS — Z85118 Personal history of other malignant neoplasm of bronchus and lung: Secondary | ICD-10-CM

## 2022-12-16 DIAGNOSIS — I129 Hypertensive chronic kidney disease with stage 1 through stage 4 chronic kidney disease, or unspecified chronic kidney disease: Secondary | ICD-10-CM | POA: Diagnosis not present

## 2022-12-16 DIAGNOSIS — R0602 Shortness of breath: Secondary | ICD-10-CM | POA: Diagnosis not present

## 2022-12-16 DIAGNOSIS — Z9049 Acquired absence of other specified parts of digestive tract: Secondary | ICD-10-CM | POA: Diagnosis not present

## 2022-12-16 DIAGNOSIS — R6889 Other general symptoms and signs: Secondary | ICD-10-CM | POA: Diagnosis not present

## 2022-12-16 DIAGNOSIS — R079 Chest pain, unspecified: Secondary | ICD-10-CM | POA: Diagnosis present

## 2022-12-16 DIAGNOSIS — D472 Monoclonal gammopathy: Secondary | ICD-10-CM | POA: Diagnosis not present

## 2022-12-16 DIAGNOSIS — F32A Depression, unspecified: Secondary | ICD-10-CM | POA: Diagnosis not present

## 2022-12-16 DIAGNOSIS — I2510 Atherosclerotic heart disease of native coronary artery without angina pectoris: Secondary | ICD-10-CM | POA: Diagnosis not present

## 2022-12-16 DIAGNOSIS — Z7982 Long term (current) use of aspirin: Secondary | ICD-10-CM | POA: Diagnosis not present

## 2022-12-16 DIAGNOSIS — R222 Localized swelling, mass and lump, trunk: Secondary | ICD-10-CM | POA: Diagnosis not present

## 2022-12-16 DIAGNOSIS — J438 Other emphysema: Secondary | ICD-10-CM | POA: Diagnosis not present

## 2022-12-16 DIAGNOSIS — J9620 Acute and chronic respiratory failure, unspecified whether with hypoxia or hypercapnia: Secondary | ICD-10-CM | POA: Diagnosis not present

## 2022-12-16 DIAGNOSIS — Z90710 Acquired absence of both cervix and uterus: Secondary | ICD-10-CM | POA: Diagnosis not present

## 2022-12-16 DIAGNOSIS — J90 Pleural effusion, not elsewhere classified: Secondary | ICD-10-CM | POA: Diagnosis not present

## 2022-12-16 DIAGNOSIS — E440 Moderate protein-calorie malnutrition: Secondary | ICD-10-CM | POA: Diagnosis not present

## 2022-12-16 DIAGNOSIS — Z66 Do not resuscitate: Secondary | ICD-10-CM | POA: Diagnosis present

## 2022-12-16 DIAGNOSIS — Z743 Need for continuous supervision: Secondary | ICD-10-CM | POA: Diagnosis not present

## 2022-12-16 DIAGNOSIS — I252 Old myocardial infarction: Secondary | ICD-10-CM | POA: Diagnosis not present

## 2022-12-16 DIAGNOSIS — Z7902 Long term (current) use of antithrombotics/antiplatelets: Secondary | ICD-10-CM

## 2022-12-16 DIAGNOSIS — C3492 Malignant neoplasm of unspecified part of left bronchus or lung: Secondary | ICD-10-CM | POA: Diagnosis present

## 2022-12-16 DIAGNOSIS — C3412 Malignant neoplasm of upper lobe, left bronchus or lung: Secondary | ICD-10-CM | POA: Diagnosis not present

## 2022-12-16 LAB — COMPREHENSIVE METABOLIC PANEL
ALT: 29 U/L (ref 0–44)
AST: 40 U/L (ref 15–41)
Albumin: 3.1 g/dL — ABNORMAL LOW (ref 3.5–5.0)
Alkaline Phosphatase: 65 U/L (ref 38–126)
Anion gap: 7 (ref 5–15)
BUN: 44 mg/dL — ABNORMAL HIGH (ref 8–23)
CO2: 25 mmol/L (ref 22–32)
Calcium: 8.8 mg/dL — ABNORMAL LOW (ref 8.9–10.3)
Chloride: 105 mmol/L (ref 98–111)
Creatinine, Ser: 1.47 mg/dL — ABNORMAL HIGH (ref 0.44–1.00)
GFR, Estimated: 35 mL/min — ABNORMAL LOW (ref >60)
Glucose, Bld: 133 mg/dL — ABNORMAL HIGH (ref 70–99)
Potassium: 4.1 mmol/L (ref 3.5–5.1)
Sodium: 137 mmol/L (ref 135–145)
Total Bilirubin: 0.3 mg/dL (ref 0.3–1.2)
Total Protein: 9 g/dL — ABNORMAL HIGH (ref 6.5–8.1)

## 2022-12-16 LAB — CBC WITH DIFFERENTIAL/PLATELET
Abs Immature Granulocytes: 0.03 10*3/uL (ref 0.00–0.07)
Basophils Absolute: 0 10*3/uL (ref 0.0–0.1)
Basophils Relative: 0 %
Eosinophils Absolute: 0 10*3/uL (ref 0.0–0.5)
Eosinophils Relative: 1 %
HCT: 26.3 % — ABNORMAL LOW (ref 36.0–46.0)
Hemoglobin: 8.2 g/dL — ABNORMAL LOW (ref 12.0–15.0)
Immature Granulocytes: 1 %
Lymphocytes Relative: 41 %
Lymphs Abs: 2.5 10*3/uL (ref 0.7–4.0)
MCH: 30.9 pg (ref 26.0–34.0)
MCHC: 31.2 g/dL (ref 30.0–36.0)
MCV: 99.2 fL (ref 80.0–100.0)
Monocytes Absolute: 0.6 10*3/uL (ref 0.1–1.0)
Monocytes Relative: 10 %
Neutro Abs: 3 10*3/uL (ref 1.7–7.7)
Neutrophils Relative %: 47 %
Platelets: 215 10*3/uL (ref 150–400)
RBC: 2.65 MIL/uL — ABNORMAL LOW (ref 3.87–5.11)
RDW: 14.6 % (ref 11.5–15.5)
WBC: 6.2 10*3/uL (ref 4.0–10.5)
nRBC: 0 % (ref 0.0–0.2)

## 2022-12-16 LAB — I-STAT CHEM 8, ED
BUN: 49 mg/dL — ABNORMAL HIGH (ref 8–23)
Calcium, Ion: 1.28 mmol/L (ref 1.15–1.40)
Chloride: 107 mmol/L (ref 98–111)
Creatinine, Ser: 1.4 mg/dL — ABNORMAL HIGH (ref 0.44–1.00)
Glucose, Bld: 167 mg/dL — ABNORMAL HIGH (ref 70–99)
HCT: 30 % — ABNORMAL LOW (ref 36.0–46.0)
Hemoglobin: 10.2 g/dL — ABNORMAL LOW (ref 12.0–15.0)
Potassium: 4.3 mmol/L (ref 3.5–5.1)
Sodium: 142 mmol/L (ref 135–145)
TCO2: 28 mmol/L (ref 22–32)

## 2022-12-16 LAB — HEPARIN LEVEL (UNFRACTIONATED): Heparin Unfractionated: 0.44 IU/mL (ref 0.30–0.70)

## 2022-12-16 LAB — BRAIN NATRIURETIC PEPTIDE: B Natriuretic Peptide: 526 pg/mL — ABNORMAL HIGH (ref 0.0–100.0)

## 2022-12-16 LAB — TROPONIN I (HIGH SENSITIVITY)
Troponin I (High Sensitivity): 56 ng/L — ABNORMAL HIGH (ref <18)
Troponin I (High Sensitivity): 2115 ng/L (ref <18)

## 2022-12-16 MED ORDER — IOHEXOL 350 MG/ML SOLN
60.0000 mL | Freq: Once | INTRAVENOUS | Status: AC | PRN
  Administered 2022-12-16: 60 mL via INTRAVENOUS

## 2022-12-16 MED ORDER — NITROGLYCERIN 0.4 MG SL SUBL
0.4000 mg | SUBLINGUAL_TABLET | SUBLINGUAL | Status: DC | PRN
  Administered 2022-12-17: 0.4 mg via SUBLINGUAL
  Filled 2022-12-16: qty 1

## 2022-12-16 MED ORDER — METOPROLOL TARTRATE 25 MG PO TABS
25.0000 mg | ORAL_TABLET | Freq: Two times a day (BID) | ORAL | Status: DC
  Administered 2022-12-16 – 2022-12-19 (×7): 25 mg via ORAL
  Filled 2022-12-16 (×7): qty 1

## 2022-12-16 MED ORDER — ENSURE ENLIVE PO LIQD
237.0000 mL | Freq: Two times a day (BID) | ORAL | Status: DC
  Administered 2022-12-16 – 2022-12-19 (×7): 237 mL via ORAL
  Filled 2022-12-16 (×3): qty 237

## 2022-12-16 MED ORDER — SODIUM CHLORIDE 0.9% FLUSH: 3.0000 mL | INTRAVENOUS | Status: DC | PRN

## 2022-12-16 MED ORDER — HEPARIN (PORCINE) 25000 UT/250ML-% IV SOLN
600.0000 [IU]/h | INTRAVENOUS | Status: AC
  Administered 2022-12-16: 650 [IU]/h via INTRAVENOUS
  Administered 2022-12-17: 600 [IU]/h via INTRAVENOUS
  Filled 2022-12-16: qty 250

## 2022-12-16 MED ORDER — ACETAMINOPHEN 325 MG PO TABS: 650.0000 mg | ORAL_TABLET | Freq: Four times a day (QID) | ORAL | Status: DC | PRN

## 2022-12-16 MED ORDER — CLOPIDOGREL BISULFATE 75 MG PO TABS
75.0000 mg | ORAL_TABLET | Freq: Every day | ORAL | Status: DC
  Administered 2022-12-16 – 2022-12-19 (×4): 75 mg via ORAL
  Filled 2022-12-16 (×4): qty 1

## 2022-12-16 MED ORDER — ROSUVASTATIN CALCIUM 20 MG PO TABS
20.0000 mg | ORAL_TABLET | Freq: Every day | ORAL | Status: DC
  Administered 2022-12-16 – 2022-12-18 (×3): 20 mg via ORAL
  Filled 2022-12-16 (×3): qty 1

## 2022-12-16 MED ORDER — LEVALBUTEROL HCL 1.25 MG/0.5ML IN NEBU
INHALATION_SOLUTION | RESPIRATORY_TRACT | Status: AC
  Filled 2022-12-16: qty 0.5

## 2022-12-16 MED ORDER — IPRATROPIUM-ALBUTEROL 20-100 MCG/ACT IN AERS: 1.0000 | INHALATION_SPRAY | Freq: Four times a day (QID) | RESPIRATORY_TRACT | Status: DC | PRN

## 2022-12-16 MED ORDER — SODIUM CHLORIDE 0.9% FLUSH
3.0000 mL | Freq: Two times a day (BID) | INTRAVENOUS | Status: DC
  Administered 2022-12-16 – 2022-12-19 (×6): 3 mL via INTRAVENOUS

## 2022-12-16 MED ORDER — ISOSORBIDE MONONITRATE ER 60 MG PO TB24
90.0000 mg | ORAL_TABLET | Freq: Every day | ORAL | Status: DC
  Administered 2022-12-16 – 2022-12-19 (×4): 90 mg via ORAL
  Filled 2022-12-16 (×4): qty 1

## 2022-12-16 MED ORDER — ONDANSETRON HCL 4 MG/2ML IJ SOLN: 4.0000 mg | Freq: Four times a day (QID) | INTRAMUSCULAR | Status: DC | PRN

## 2022-12-16 MED ORDER — SODIUM CHLORIDE 0.9 % IV SOLN: 250.0000 mL | INTRAVENOUS | Status: DC | PRN

## 2022-12-16 MED ORDER — FUROSEMIDE 20 MG PO TABS
20.0000 mg | ORAL_TABLET | Freq: Every day | ORAL | Status: DC
  Administered 2022-12-16 – 2022-12-19 (×4): 20 mg via ORAL
  Filled 2022-12-16 (×4): qty 1

## 2022-12-16 MED ORDER — ASPIRIN 81 MG PO TBEC
81.0000 mg | DELAYED_RELEASE_TABLET | Freq: Every morning | ORAL | Status: DC
  Administered 2022-12-16 – 2022-12-19 (×3): 81 mg via ORAL
  Filled 2022-12-16 (×3): qty 1

## 2022-12-16 MED ORDER — METHYLPREDNISOLONE SODIUM SUCC 125 MG IJ SOLR
125.0000 mg | Freq: Once | INTRAMUSCULAR | Status: AC
  Administered 2022-12-16: 125 mg via INTRAVENOUS
  Filled 2022-12-16: qty 2

## 2022-12-16 MED ORDER — LEVALBUTEROL HCL 0.63 MG/3ML IN NEBU
INHALATION_SOLUTION | RESPIRATORY_TRACT | Status: AC
  Filled 2022-12-16: qty 3

## 2022-12-16 MED ORDER — IPRATROPIUM BROMIDE 0.02 % IN SOLN
RESPIRATORY_TRACT | Status: AC
  Filled 2022-12-16: qty 2.5

## 2022-12-16 MED ORDER — HEPARIN BOLUS VIA INFUSION
2500.0000 [IU] | Freq: Once | INTRAVENOUS | Status: AC
  Administered 2022-12-16: 2500 [IU] via INTRAVENOUS

## 2022-12-16 MED ORDER — IPRATROPIUM-ALBUTEROL 0.5-2.5 (3) MG/3ML IN SOLN: 3.0000 mL | RESPIRATORY_TRACT | Status: DC | PRN

## 2022-12-16 MED ORDER — LABETALOL HCL 5 MG/ML IV SOLN
10.0000 mg | Freq: Once | INTRAVENOUS | Status: AC
  Administered 2022-12-16: 10 mg via INTRAVENOUS
  Filled 2022-12-16: qty 4

## 2022-12-16 NOTE — Progress Notes (Signed)
Pt transported to CT and back without complication.  Pt unable to tolerate coming off of bipap at this time due to WOB.

## 2022-12-16 NOTE — ED Provider Notes (Signed)
[place] EMERGENCY DEPARTMENT AT [facility] Provider Note   CSN: [id number] Arrival date & time: 12/16/22  4098     History  Chief Complaint  Patient presents with   Chest Pain    Lisa [name] is a 88 y.o. female.  Patient presents to the emergency department for evaluation of chest pain.  Patient reports that it started about 4:30 AM.  Patient reports that she did have a recent heart attack but did not receive stents.  She took 2 nitroglycerin prior to EMS arriving to her home.  Patient noted to be in respiratory distress at arrival.       Home Medications Prior to Admission medications   Medication Sig Start Date End Date Taking? Authorizing Provider  amLODipine (NORVASC) 5 MG tablet Take 1 tablet (5 mg total) by mouth daily. 11/21/22   Johnson, Clanford L, MD  aspirin 81 MG EC tablet Take 81 mg by mouth in the morning.    [provider]  calcium carbonate (OS-CAL) 1250 (500 Ca) MG chewable tablet Chew 1 tablet by mouth daily.    [provider]  Cholecalciferol (VITAMIN D3) 125 MCG (5000 UT) CAPS Take 5,000 Units by mouth daily.    [provider]  clopidogrel (PLAVIX) 75 MG tablet Take 1 tablet (75 mg total) by mouth daily. 11/22/22   Johnson, Clanford L, MD  feeding supplement (ENSURE ENLIVE / ENSURE PLUS) LIQD Take 237 mLs by mouth 2 (two) times daily between meals. 12/31/21   Sherryll Burger, Pratik D, DO  furosemide (LASIX) 20 MG tablet Take 1 tablet (20 mg total) by mouth daily. 12/08/22 03/08/23  Strader, Lennart Pall, PA-C  Ipratropium-Albuterol (COMBIVENT RESPIMAT) 20-100 MCG/ACT AERS respimat Inhale 1 puff into the lungs every 6 (six) hours as needed for wheezing or shortness of breath. 12/31/21   Sherryll Burger, Pratik D, DO  ipratropium-albuterol (DUONEB) 0.5-2.5 (3) MG/3ML SOLN Take 3 mLs by nebulization every 4 (four) hours as needed (wheezing/shortness of breath).    [provider]  Iron, Ferrous Sulfate, 325 (65 Fe) MG TABS Take 1  tablet by mouth daily.    [provider]  isosorbide mononitrate (IMDUR) 60 MG 24 hr tablet Take 1.5 tablets (90 mg total) by mouth daily. 12/12/22 01/11/23  [name], MD  metoprolol tartrate (LOPRESSOR) 25 MG tablet Take 1 tablet (25 mg total) by mouth 2 (two) times daily. 11/21/22   Cleora Fleet, MD  Multiple Vitamin (MULTIVITAMIN WITH MINERALS) TABS tablet Take 1 tablet by mouth daily. 11/22/22   Johnson, Clanford L, MD  nitroGLYCERIN (NITROSTAT) 0.4 MG SL tablet Place 1 tablet (0.4 mg total) under the tongue every 5 (five) minutes as needed for chest pain. 09/06/21 12/08/22  Pokhrel, Rebekah Chesterfield, MD  ondansetron (ZOFRAN) 4 MG tablet Take 1 tablet (4 mg total) by mouth every 6 (six) hours as needed for nausea. 09/06/21   Pokhrel, Rebekah Chesterfield, MD  rosuvastatin (CRESTOR) 20 MG tablet Take 1 tablet (20 mg total) by mouth daily at 6 PM. 11/21/22   Cleora Fleet, MD      Allergies    Patient has no known allergies.    Review of Systems   Review of Systems  Physical Exam Updated Vital Signs BP (!) 204/86 (BP Location: Left Arm)   Pulse 96   Resp (!) 26   Ht 5\' 7"  (1.702 m)   Wt 46.2 kg   SpO2 100%   BMI 15.94 kg/m  Physical Exam Vitals and nursing note  reviewed.  Constitutional:      General: She is not in acute distress.    Appearance: She is well-developed.  HENT:     Head: Normocephalic and atraumatic.     Mouth/Throat:     Mouth: Mucous membranes are moist.  Eyes:     General: Vision grossly intact. Gaze aligned appropriately.     Extraocular Movements: Extraocular movements intact.     Conjunctiva/sclera: Conjunctivae normal.  Cardiovascular:     Rate and Rhythm: Regular rhythm. Tachycardia present.     Pulses: Normal pulses.     Heart sounds: Normal heart sounds, S1 normal and S2 normal. No murmur heard.    No friction rub. No gallop.  Pulmonary:     Effort: Tachypnea, accessory muscle usage and prolonged expiration present. No respiratory distress.      Breath sounds: Decreased breath sounds and wheezing present.  Abdominal:     General: Bowel sounds are normal.     Palpations: Abdomen is soft.     Tenderness: There is no abdominal tenderness. There is no guarding or rebound.     Hernia: No hernia is present.  Musculoskeletal:        General: No swelling.     Cervical back: Full passive range of motion without pain, normal range of motion and neck supple. No spinous process tenderness or muscular tenderness. Normal range of motion.     Right lower leg: No edema.     Left lower leg: No edema.  Skin:    General: Skin is warm and dry.     Capillary Refill: Capillary refill takes less than 2 seconds.     Findings: No ecchymosis, erythema, rash or wound.  Neurological:     General: No focal deficit present.     Mental Status: She is alert and oriented to person, place, and time.     GCS: GCS eye subscore is 4. GCS verbal subscore is 5. GCS motor subscore is 6.     Cranial Nerves: Cranial nerves 2-12 are intact.     Sensory: Sensation is intact.     Motor: Motor function is intact.     Coordination: Coordination is intact.  Psychiatric:        Attention and Perception: Attention normal.        Mood and Affect: Mood normal.        Speech: Speech normal.        Behavior: Behavior normal.     ED Results / Procedures / Treatments   Labs (all labs ordered are listed, but only abnormal results are displayed) Labs Reviewed  CBC WITH DIFFERENTIAL/PLATELET - Abnormal; Notable for the following components:      Result Value   RBC 2.65 (*)    Hemoglobin 8.2 (*)    HCT 26.3 (*)    All other components within normal limits  I-STAT CHEM 8, ED - Abnormal; Notable for the following components:   BUN 49 (*)    Creatinine, Ser 1.40 (*)    Glucose, Bld 167 (*)    Hemoglobin 10.2 (*)    HCT 30.0 (*)    All other components within normal limits  COMPREHENSIVE METABOLIC PANEL  BRAIN NATRIURETIC PEPTIDE  TROPONIN I (HIGH SENSITIVITY)     EKG EKG Interpretation Date/Time:  Saturday December 16 2022 06:08:25 EDT Ventricular Rate:  110 PR Interval:  117 QRS Duration:  95 QT Interval:  283 QTC Calculation: 383 R Axis:   68  Text Interpretation: Sinus tachycardia with  irregular rate Consider right atrial enlargement Probable LVH with secondary repol abnrm Repol abnrm, severe global ischemia (LM/MVD) Baseline wander in lead(s) V4 similar to prior Confirmed by Gilda Crease [phone]) on 12/16/2022 6:11:07 AM  Radiology No results found.  Procedures Procedures    Medications Ordered in ED Medications  labetalol (NORMODYNE) injection 10 mg (has no administration in time range)  levalbuterol (XOPENEX) 0.63 MG/3ML nebulizer solution (  Given 12/16/22 0639)  ipratropium (ATROVENT) 0.02 % nebulizer solution (  Given 12/16/22 0639)  levalbuterol (XOPENEX) 1.25 MG/0.5ML nebulizer solution (  Given 12/16/22 6045)    ED Course/ Medical Decision Making/ A&P                             Medical Decision Making Amount and/or Complexity of Data Reviewed External Data Reviewed: labs, radiology, ECG and notes.    Details: Hospitalization on 11/21/2022 for similar picture of severe respiratory distress secondary to COPD, required BiPAP.  No mention of recent heart attack. Labs: ordered. Decision-making details documented in ED Course. Radiology: ordered.   Differential Diagnosis considered includes, but not limited to: STEMI; NSTEMI; myocarditis; pericarditis; pulmonary embolism; aortic dissection; pneumothorax; pneumonia; gastritis; musculoskeletal pain  Patient presents to the emergency department for evaluation of chest pain and shortness of breath.  Patient does have history of COPD.  Records review reveals that she had a presentation to this ED several weeks ago that was very similar.  She was treated for COPD exacerbation during hospitalization including BiPAP.  Patient is in distress at arrival.  She does not examine like  she is volume overloaded.  She is complaining of chest pain as well.  EKG with diffuse repolarization abnormalities, but similar to prior EKGs, no change.  Patient placed on BiPAP.  She has already tachycardic, sinus rhythm.  Will administer Xopenex for bronchospasm.  Patient will be signed out to oncoming ER physician to follow-up on remainder of results.  CRITICAL CARE Performed by: Gilda Crease   Total critical care time: 30 minutes  Critical care time was exclusive of separately billable procedures and treating other patients.  Critical care was necessary to treat or prevent imminent or life-threatening deterioration.  Critical care was time spent personally by me on the following activities: development of treatment plan with patient and/or surrogate as well as nursing, discussions with consultants, evaluation of patient's response to treatment, examination of patient, obtaining history from patient or surrogate, ordering and performing treatments and interventions, ordering and review of laboratory studies, ordering and review of radiographic studies, pulse oximetry and re-evaluation of patient's condition.         Final Clinical Impression(s) / ED Diagnoses Final diagnoses:  Acute on chronic respiratory failure, unspecified whether with hypoxia or hypercapnia [facility])    Rx / DC Orders ED Discharge Orders     None         Trajon Rosete, Canary Brim, MD 12/16/22 [phone]

## 2022-12-16 NOTE — Assessment & Plan Note (Signed)
-  Appears to be stable and at baseline -Continue monitoring renal function trend -Minimize nephrotoxic agents as much as possible and maintain adequate hydration.

## 2022-12-16 NOTE — Assessment & Plan Note (Signed)
-  Secondary to NSTEMI -Patient declining any intervention or invasive evaluation. -On previous record due to chronic renal failure she was found not a candidate for cardiac catheterization or amenable candidate for dialysis if cardiac cath pursued. -Patient completed medical management with the use of beta-blocker, Imdur, and 48 hours of heparin drip. -At discharge we will continue the use of aspirin, Plavix, statin, beta-blocker, Imdur and Ranexa -Advised to maintain adequate hydration and after further discussing with patient and her niece (healthcare power of attorney) will arrange for palliative care follow-up as an outpatient.

## 2022-12-16 NOTE — Progress Notes (Signed)
ANTICOAGULATION CONSULT NOTE - Follow Up Consult  Pharmacy Consult for heparin Indication: chest pain/ACS  No Known Allergies  Patient Measurements: Height: 5\' 7"  (170.2 cm) Weight: 47.9 kg (105 lb 9.6 oz) IBW/kg (Calculated) : 61.6 Heparin Dosing Weight: 47 kg  Vital Signs: Temp: 98.8 F (37.1 C) (07/06 2003) Temp Source: Oral (07/06 2003) BP: 145/59 (07/06 2003) Pulse Rate: 69 (07/06 2003)  Labs: Recent Labs    12/16/22 0628 12/16/22 0454 12/16/22 0837 12/16/22 2012  HGB 8.2* 10.2*  --   --   HCT 26.3* 30.0*  --   --   PLT 215  --   --   --   HEPARINUNFRC  --   --   --  0.44  CREATININE 1.47* 1.40*  --   --   TROPONINIHS 56*  --  2,115*  --     Estimated Creatinine Clearance: 22.2 mL/min (A) (by C-G formula based on SCr of 1.4 mg/dL (H)).   Medications:  Medications Prior to Admission  Medication Sig Dispense Refill Last Dose   amLODipine (NORVASC) 5 MG tablet Take 1 tablet (5 mg total) by mouth daily. 30 tablet 1    aspirin 81 MG EC tablet Take 81 mg by mouth in the morning.      calcium carbonate (OS-CAL) 1250 (500 Ca) MG chewable tablet Chew 1 tablet by mouth daily.      Cholecalciferol (VITAMIN D3) 125 MCG (5000 UT) CAPS Take 5,000 Units by mouth daily.      clopidogrel (PLAVIX) 75 MG tablet Take 1 tablet (75 mg total) by mouth daily. 30 tablet 2    feeding supplement (ENSURE ENLIVE / ENSURE PLUS) LIQD Take 237 mLs by mouth 2 (two) times daily between meals. 237 mL 12    furosemide (LASIX) 20 MG tablet Take 1 tablet (20 mg total) by mouth daily. 90 tablet 3    Ipratropium-Albuterol (COMBIVENT RESPIMAT) 20-100 MCG/ACT AERS respimat Inhale 1 puff into the lungs every 6 (six) hours as needed for wheezing or shortness of breath. 4 g 2    ipratropium-albuterol (DUONEB) 0.5-2.5 (3) MG/3ML SOLN Take 3 mLs by nebulization every 4 (four) hours as needed (wheezing/shortness of breath).      Iron, Ferrous Sulfate, 325 (65 Fe) MG TABS Take 1 tablet by mouth daily.       isosorbide mononitrate (IMDUR) 60 MG 24 hr tablet Take 1.5 tablets (90 mg total) by mouth daily. 90 tablet 2    metoprolol tartrate (LOPRESSOR) 25 MG tablet Take 1 tablet (25 mg total) by mouth 2 (two) times daily. 60 tablet 2    Multiple Vitamin (MULTIVITAMIN WITH MINERALS) TABS tablet Take 1 tablet by mouth daily.      nitroGLYCERIN (NITROSTAT) 0.4 MG SL tablet Place 1 tablet (0.4 mg total) under the tongue every 5 (five) minutes as needed for chest pain. 100 tablet 1    ondansetron (ZOFRAN) 4 MG tablet Take 1 tablet (4 mg total) by mouth every 6 (six) hours as needed for nausea. 20 tablet 0    rosuvastatin (CRESTOR) 20 MG tablet Take 1 tablet (20 mg total) by mouth daily at 6 PM. 30 tablet 2     Assessment: 5 year year old female readmitted to [facility] with chest pain and elevated troponin. Patient had similar presentation last month and was felt too high risk for procedures. Will add back IV heparin this morning. Hemoglobin improved to 10.2, plt within normal limits.   Heparin level this evening is therapeutic at 0.44.  No issues with infusion or bleeding reported.  Goal of Therapy:  Heparin level 0.3-0.7 units/ml Monitor platelets by anticoagulation protocol: Yes   Plan:  Continue heparin infusion at 650 units/hr Check anti-Xa level in 8 hours and daily while on heparin Continue to monitor H&H and platelets   Thank you for allowing pharmacy to be a part of this patient's care.   Signe Colt, PharmD 12/16/2022 9:04 PM  **Pharmacist phone directory can be found on [email or web address] listed under [facility]**

## 2022-12-16 NOTE — Progress Notes (Signed)
ANTICOAGULATION CONSULT NOTE - Initial Consult  Pharmacy Consult for heparin Indication: chest pain/ACS  No Known Allergies  Patient Measurements: Height: 5\' 7"  (170.2 cm) Weight: 46.2 kg (101 lb 12.6 oz) IBW/kg (Calculated) : 61.6 Heparin Dosing Weight: 46kg  Vital Signs: Temp: 98.7 F (37.1 C) (07/06 0828) Temp Source: Oral (07/06 0828) BP: 169/78 (07/06 0828) Pulse Rate: 68 (07/06 0828)  Labs: Recent Labs    12/16/22 0628 12/16/22 0632 12/16/22 0837  HGB 8.2* 10.2*  --   HCT 26.3* 30.0*  --   PLT 215  --   --   CREATININE 1.47* 1.40*  --   TROPONINIHS 56*  --  2,115*    Estimated Creatinine Clearance: 21.4 mL/min (A) (by C-G formula based on SCr of 1.4 mg/dL (H)).   Medical History: Past Medical History:  Diagnosis Date   Anemia    Carotid artery stenosis    CKD (chronic kidney disease)    COPD (chronic obstructive pulmonary disease) (HCC)    Dyspnea    Due to COPD per patient   Goiter diffuse, nontoxic    Per patient   Hepatitis C    History of radiation therapy    Left lung-10/10/21-10/17/21- Dr. Antony Blackbird   Hypertension    Monoclonal gammopathy of unknown significance (MGUS)    Pyelonephritis 04/06/2014   UTI    Assessment: 41 year year old female readmitted to [facility] with chest pain and elevated troponin. Patient had similar presentation last month and was felt too high risk for procedures. Will add back IV heparin this morning. Hemoglobin improved to 10.2, plt within normal limits.   Goal of Therapy:  Heparin level 0.3-0.7 units/ml Monitor platelets by anticoagulation protocol: Yes   Plan:  Give 2500 units bolus x 1 Start heparin infusion at 650 units/hr Check anti-Xa level in 8 hours and daily while on heparin Continue to monitor H&H and platelets  Sheppard Coil PharmD., BCPS Clinical Pharmacist 12/16/2022 10:25 AM

## 2022-12-16 NOTE — ED Notes (Signed)
ED TO INPATIENT HANDOFF REPORT  ED Nurse Name and Phone #: Casimiro Needle [phone]  S Name/Age/Gender Lisa [name] 88 y.o. female Room/Bed: APA02/APA02  Code Status   Code Status: Prior  Home/SNF/Other Home Patient oriented to: self, place, time, and situation Is this baseline? Yes   Triage Complete: Triage complete  Chief Complaint Chest pain [R07.9]  Triage Note Patient from home for chest pain that started at 0430. Patient reports she had an MI 3 weeks ago, but "they are not doing anything for it". Patient took 2 Nitroglycerin prior to EMS arrival. Upon arrival to ER, patient is alert and oriented, increased resp and work of breathing.   Allergies No Known Allergies  Level of Care/Admitting Diagnosis ED Disposition     ED Disposition  Admit   Condition  --   Comment  Hospital Area: [facility] [[id number]]  Level of Care: Telemetry [5]  Covid Evaluation: Asymptomatic - no recent exposure (last 10 days) testing not required  Diagnosis: Chest pain [454098]  Admitting Physician: Vassie Loll [[id number]]  Attending Physician: Vassie Loll [[id number]]          B Medical/Surgery History Past Medical History:  Diagnosis Date   Anemia    Carotid artery stenosis    CKD (chronic kidney disease)    COPD (chronic obstructive pulmonary disease) (HCC)    Dyspnea    Due to COPD per patient   Goiter diffuse, nontoxic    Per patient   Hepatitis C    History of radiation therapy    Left lung-10/10/21-10/17/21- Dr. Antony Blackbird   Hypertension    Monoclonal gammopathy of unknown significance (MGUS)    Pyelonephritis 04/06/2014   UTI   Past Surgical History:  Procedure Laterality Date   ABDOMINAL HYSTERECTOMY     CHOLECYSTECTOMY N/A 09/17/2018   Procedure: LAPAROSCOPIC CHOLECYSTECTOMY;  Surgeon: Lucretia Roers, MD;  Location: [facility];  Service: General;  Laterality: N/A;   FUDUCIAL PLACEMENT Left 09/01/2021   Procedure: PLACEMENT OF FIDUCIAL MARKERS TIMES FOUR;   Surgeon: Loreli Slot, MD;  Location: [facility] OR;  Service: Thoracic;  Laterality: Left;   TEE WITHOUT CARDIOVERSION N/A 04/10/2014   Procedure: TRANSESOPHAGEAL ECHOCARDIOGRAM (TEE);  Surgeon: Donato Schultz, MD;  Location: [facility] ENDOSCOPY;  Service: Cardiovascular;  Laterality: N/A;   VIDEO BRONCHOSCOPY WITH ENDOBRONCHIAL NAVIGATION N/A 09/01/2021   Procedure: VIDEO BRONCHOSCOPY WITH ENDOBRONCHIAL NAVIGATION;  Surgeon: Loreli Slot, MD;  Location: [facility] OR;  Service: Thoracic;  Laterality: N/A;     A IV Location/Drains/Wounds Patient Lines/Drains/Airways Status     Active Line/Drains/Airways     Name Placement date Placement time Site Days   Peripheral IV 12/16/22 18 G Right Antecubital 12/16/22  0628  Antecubital  less than 1   Peripheral IV 12/16/22 20 G 1" Left Antecubital 12/16/22  1233  Antecubital  less than 1            Intake/Output Last 24 hours  Intake/Output Summary (Last 24 hours) at 12/16/2022 1531 Last data filed at 12/16/2022 1233 Gross per 24 hour  Intake 36.95 ml  Output --  Net 36.95 ml    Labs/Imaging Results for orders placed or performed during the hospital encounter of 12/16/22 (from the past 48 hour(s))  CBC with Differential/Platelet     Status: Abnormal   Collection Time: 12/16/22  6:28 AM  Result Value Ref Range   WBC 6.2 4.0 - 10.5 K/uL   RBC 2.65 (L) 3.87 - 5.11 MIL/uL   Hemoglobin 8.2 (L)  12.0 - 15.0 g/dL   HCT 16.1 (L) 09.6 - 04.5 %   MCV 99.2 80.0 - 100.0 fL   MCH 30.9 26.0 - 34.0 pg   MCHC 31.2 30.0 - 36.0 g/dL   RDW 40.9 81.1 - 91.4 %   Platelets 215 150 - 400 K/uL   nRBC 0.0 0.0 - 0.2 %   Neutrophils Relative % 47 %   Neutro Abs 3.0 1.7 - 7.7 K/uL   Lymphocytes Relative 41 %   Lymphs Abs 2.5 0.7 - 4.0 K/uL   Monocytes Relative 10 %   Monocytes Absolute 0.6 0.1 - 1.0 K/uL   Eosinophils Relative 1 %   Eosinophils Absolute 0.0 0.0 - 0.5 K/uL   Basophils Relative 0 %   Basophils Absolute 0.0 0.0 - 0.1 K/uL   Immature Granulocytes  1 %   Abs Immature Granulocytes 0.03 0.00 - 0.07 K/uL    Comment: Performed at [facility], [address] [place], [place] [address]  Comprehensive metabolic panel     Status: Abnormal   Collection Time: 12/16/22  6:28 AM  Result Value Ref Range   Sodium 137 135 - 145 mmol/L   Potassium 4.1 3.5 - 5.1 mmol/L   Chloride 105 98 - 111 mmol/L   CO2 25 22 - 32 mmol/L   Glucose, Bld 133 (H) 70 - 99 mg/dL    Comment: Glucose reference range applies only to samples taken after fasting for at least 8 hours.   BUN 44 (H) 8 - 23 mg/dL   Creatinine, Ser 6.21 (H) 0.44 - 1.00 mg/dL   Calcium 8.8 (L) 8.9 - 10.3 mg/dL   Total Protein 9.0 (H) 6.5 - 8.1 g/dL   Albumin 3.1 (L) 3.5 - 5.0 g/dL   AST 40 15 - 41 U/L   ALT 29 0 - 44 U/L   Alkaline Phosphatase 65 38 - 126 U/L   Total Bilirubin 0.3 0.3 - 1.2 mg/dL   GFR, Estimated 35 (L) >60 mL/min    Comment: (NOTE) Calculated using the CKD-EPI Creatinine Equation (2021)    Anion gap 7 5 - 15    Comment: Performed at [facility], [address], [place], [place] [address]  Troponin I (High Sensitivity)     Status: Abnormal   Collection Time: 12/16/22  6:28 AM  Result Value Ref Range   Troponin I (High Sensitivity) 56 (H) <18 ng/L    Comment: (NOTE) Elevated high sensitivity troponin I (hsTnI) values and significant  changes across serial measurements may suggest ACS but many other  chronic and acute conditions are known to elevate hsTnI results.  Refer to the "Links" section for chest pain algorithms and additional  guidance. Performed at [facility], [address] [place], [place] [address]   Brain natriuretic peptide     Status: Abnormal   Collection Time: 12/16/22  6:28 AM  Result Value Ref Range   B Natriuretic Peptide 526.0 (H) 0.0 - 100.0 pg/mL    Comment: Performed at [facility], [address], [place], [place] [address]  I-stat chem 8, ED (not at [facility], [facility] or [facility])     Status: Abnormal   Collection Time: 12/16/22  6:32 AM  Result  Value Ref Range   Sodium 142 135 - 145 mmol/L   Potassium 4.3 3.5 - 5.1 mmol/L   Chloride 107 98 - 111 mmol/L   BUN 49 (H) 8 - 23 mg/dL   Creatinine, Ser 8.41 (H) 0.44 - 1.00 mg/dL   Glucose,  Bld 167 (H) 70 - 99 mg/dL    Comment: Glucose reference range applies only to samples taken after fasting for at least 8 hours.   Calcium, Ion 1.28 1.15 - 1.40 mmol/L   TCO2 28 22 - 32 mmol/L   Hemoglobin 10.2 (L) 12.0 - 15.0 g/dL   HCT 82.9 (L) 56.2 - 13.0 %  Troponin I (High Sensitivity)     Status: Abnormal   Collection Time: 12/16/22  8:37 AM  Result Value Ref Range   Troponin I (High Sensitivity) 2,115 (HH) <18 ng/L    Comment: DELTA CHECK NOTED CRITICAL RESULT CALLED TO, READ BACK BY AND VERIFIED WITH WELCH K @ 0918 ON [id number] BY HENDERSON L (NOTE) Elevated high sensitivity troponin I (hsTnI) values and significant  changes across serial measurements may suggest ACS but many other  chronic and acute conditions are known to elevate hsTnI results.  Refer to the "Links" section for chest pain algorithms and additional  guidance. Performed at [facility], [address] [place], [place] [address]    CT Angio Chest Pulmonary Embolism (PE) W or WO Contrast  Result Date: 12/16/2022 CLINICAL DATA:  Pulmonary embolism (PE) suspected, high prob. History of non-small cell lung cancer EXAM: CT ANGIOGRAPHY CHEST WITH CONTRAST TECHNIQUE: Multidetector CT imaging of the chest was performed using the standard protocol during bolus administration of intravenous contrast. Multiplanar CT image reconstructions and MIPs were obtained to evaluate the vascular anatomy. RADIATION DOSE REDUCTION: This exam was performed according to the departmental dose-optimization program which includes automated exposure control, adjustment of the mA and/or kV according to patient size and/or use of iterative reconstruction technique. CONTRAST:  60mL OMNIPAQUE IOHEXOL 350 MG/ML SOLN COMPARISON:  X-ray 12/16/2022, CT 11/29/2022  FINDINGS: Cardiovascular: Satisfactory opacification of the pulmonary arteries to the segmental level. No evidence of pulmonary embolism. Thoracic aorta is nonaneurysmal. Atherosclerotic calcifications of the aorta and coronary arteries. Normal heart size. No pericardial effusion. Mediastinum/Nodes: Redemonstration of a markedly enlarged, heterogeneous thyroid gland which has been previously evaluated and biopsied. No enlarged axillary, mediastinal, or hilar lymph nodes. Trachea and esophagus demonstrate no significant abnormality. Lungs/Pleura: Progressive area of mass-like opacity with multiple fiduciary markers at the left upper lobe measuring approximately 5.0 x 3.5 x 4.0 cm, previously measured approximately 3.2 x 1.9 x 3.4 cm on 11/29/2022. Ground-glass opacities within the bilateral upper lobes. Background of emphysema. Trace bilateral pleural effusions. No pneumothorax. Upper Abdomen: No acute abnormality. Musculoskeletal: No chest wall abnormality. No acute or significant osseous findings. Review of the MIP images confirms the above findings. IMPRESSION: 1. No evidence of pulmonary embolism. 2. Progressive area of mass-like opacity within the left upper lobe measuring approximately 5.0 x 3.5 x 4.0 cm, previously measured approximately 3.2 x 1.9 x 3.4 cm on 11/29/2022. This may represent evolving radiation fibrosis although recurrent/progressive malignancy is not excluded. 3. Ground-glass opacities within the bilateral upper lobes, which may represent atypical infection or edema. 4. Trace bilateral pleural effusions. Aortic Atherosclerosis (ICD10-I70.0) and Emphysema (ICD10-J43.9). Electronically Signed   By: Duanne Guess D.O.   On: 12/16/2022 09:49   DG Chest Port 1 View  Result Date: 12/16/2022 CLINICAL DATA:  Shortness of breath. History of non-small cell lung cancer EXAM: PORTABLE CHEST 1 VIEW COMPARISON:  12/11/2022 FINDINGS: Normal heart size. Aortic atherosclerotic calcifications. No pleural  effusion or interstitial edema. Post treatment changes associated with the known left upper lobe lung cancer with central fiducial markers are again noted. Compared with the previous exam there is been interval  development of bilateral upper lobe hazy lung opacities which have a reticular interstitial appearance. The lower lung zones appear clear. IMPRESSION: Interval development of bilateral upper lobe hazy lung opacities which have a reticular interstitial appearance. Findings are nonspecific and could reflect atypical infection/inflammation. Chemotherapy induced pneumonitis not excluded. Electronically Signed   By: Signa Kell M.D.   On: 12/16/2022 06:55    Pending Labs Unresulted Labs (From admission, onward)     Start     Ordered   12/17/22 0500  CBC  Daily,   R      12/16/22 1006   12/16/22 2000  Heparin level (unfractionated)  Once-Timed,   URGENT        12/16/22 1006            Vitals/Pain Today's Vitals   12/16/22 1345 12/16/22 1400 12/16/22 1415 12/16/22 1502  BP: (!) 123/57 (!) 144/61 (!) 154/64   Pulse: 61 60 61   Resp: 19 19 (!) 23   Temp:      TempSrc:      SpO2: 100% 100% 100%   Weight:      Height:      PainSc:    0-No pain    Isolation Precautions No active isolations  Medications Medications  heparin ADULT infusion 100 units/mL (25000 units/276mL) (650 Units/hr Intravenous Infusion Verify 12/16/22 1233)  nitroGLYCERIN (NITROSTAT) SL tablet 0.4 mg (has no administration in time range)  isosorbide mononitrate (IMDUR) 24 hr tablet 90 mg (90 mg Oral Given 12/16/22 1117)  aspirin EC tablet 81 mg (81 mg Oral Given 12/16/22 1111)  clopidogrel (PLAVIX) tablet 75 mg (75 mg Oral Given 12/16/22 1111)  feeding supplement (ENSURE ENLIVE / ENSURE PLUS) liquid 237 mL (237 mLs Oral Given 12/16/22 1430)  furosemide (LASIX) tablet 20 mg (20 mg Oral Given 12/16/22 1111)  ipratropium-albuterol (DUONEB) 0.5-2.5 (3) MG/3ML nebulizer solution 3 mL (has no administration in time range)   metoprolol tartrate (LOPRESSOR) tablet 25 mg (25 mg Oral Given 12/16/22 1111)  rosuvastatin (CRESTOR) tablet 20 mg (has no administration in time range)  ondansetron (ZOFRAN) injection 4 mg (has no administration in time range)  levalbuterol (XOPENEX) 0.63 MG/3ML nebulizer solution (  Given 12/16/22 0639)  ipratropium (ATROVENT) 0.02 % nebulizer solution (  Given 12/16/22 0639)  levalbuterol (XOPENEX) 1.25 MG/0.5ML nebulizer solution (  Given 12/16/22 0639)  labetalol (NORMODYNE) injection 10 mg (10 mg Intravenous Given 12/16/22 0648)  methylPREDNISolone sodium succinate (SOLU-MEDROL) 125 mg/2 mL injection 125 mg (125 mg Intravenous Given 12/16/22 0826)  iohexol (OMNIPAQUE) 350 MG/ML injection 60 mL (60 mLs Intravenous Contrast Given 12/16/22 0851)  heparin bolus via infusion 2,500 Units (2,500 Units Intravenous Bolus from Bag 12/16/22 1038)    Mobility walks with device (walker)     Focused Assessments Cardiac Assessment Handoff:  Cardiac Rhythm: Normal sinus rhythm Lab Results  Component Value Date   TROPONINI <0.30 08/11/2011   Lab Results  Component Value Date   DDIMER 0.76 (H) 12/29/2021   Does the Patient currently have chest pain? No    R Recommendations: See Admitting Provider Note  Report given to:   Additional Notes:

## 2022-12-16 NOTE — ED Notes (Signed)
Patient remains off of bi-pap, reports breathing is better, denies shortness of  breath or chest pain. O2 sat 100% on 4l/Lisa [name]

## 2022-12-16 NOTE — ED Notes (Signed)
Patient currently on Bipap, notified MD regarding oral medications. Informed nurse to consult with respiratory to wean patient off bipap. Notified respiratory.

## 2022-12-16 NOTE — Assessment & Plan Note (Signed)
-  No active wheezing appreciated -Continue bronchodilator management -No need for steroids management at this time

## 2022-12-16 NOTE — ED Provider Notes (Signed)
  Physical Exam  BP (!) 169/78 (BP Location: Left Arm)   Pulse 68   Temp 98.7 F (37.1 C) (Oral)   Resp (!) 24   Ht 5\' 7"  (1.702 m)   Wt 46.2 kg   SpO2 100%   BMI 15.94 kg/m   Physical Exam  Procedures  Procedures  ED Course / MDM    Medical Decision Making Amount and/or Complexity of Data Reviewed Labs: ordered. Radiology: ordered.  Risk Prescription drug management.   Received patient signout.  Shortness of breath.  History of same.  Had admission to hospitals with non-STEMI.  No cardiac intervention planned due to age and comorbidities.  Also has refused further interventions.  Troponin here has now increased to 2000.  Still would not want intervention.  Was on BiPAP.  CT scan done to evaluate for pulmonary embolism which had not been on previously.  He does have some edema and potentially atypical infection.  Also radiation changes.  Will discuss with hospitalist for admission.  Heparin has been started.       Benjiman Core, MD 12/16/22 [phone]

## 2022-12-16 NOTE — ED Notes (Signed)
Bi-pap is off patient is on oxygen at 4L/min Lisa [name] , Denies shortness of breath and chest pain. No acute distress noted. O2 sat. 100%

## 2022-12-16 NOTE — Assessment & Plan Note (Signed)
-  Patient has been encouraged to maintain adequate oral intake and to use feeding supplements.

## 2022-12-16 NOTE — Assessment & Plan Note (Signed)
-  Status post radiation therapy -No actively receiving any chemo at this point -Continue patient follow-up with oncology service -Patient expressed not wanting any further treatment.

## 2022-12-16 NOTE — H&P (Signed)
History and Physical    Patient: Lisa [name] [id number]:[id number] DOB: 1934/12/01 DOA: 12/16/2022 DOS: the patient was seen and examined on 12/16/2022 PCP: [name], MD  Patient coming from: Home  Chief Complaint:  Chief Complaint  Patient presents with   Chest Pain   HPI: Lisa [name] is a 88 y.o. female with medical history significant of chronic kidney disease a stage IIIb, hepatitis C, tobacco abuse, hypertension, COPD, malignant neoplasm of the left lung treated with radiation, smoldering myeloma, coronary disease, monoclonal gammopathy of unknown significance, chronic dyspnea, coronary artery disease, anemia of chronic disease, chronic diastolic heart failure and recent hospitalization secondary to NSTEMI; presented to the hospital with chest pain and shortness of breath.  Patient symptoms similar to previous presentation at that time and she has decided not to proceed any invasive treatment and was also found by cardiology service as not a candidate for cardiac catheterization.  There has not been any fever, productive coughing spells, sick contacts, hematuria, dysuria, melena, hemoptysis, focal weaknesses or any other complaints. Workup in the ED demonstrating no acute infiltrates, positive fibrotic changes especially in her left open lung most likely associated with radiation; there was no pulmonary embolism present. Elevated troponin appreciated and initial shortness of breath requiring BiPAP to stabilize her symptoms. Steroids, one-time dose of Lasix IV and heparin drip was initiated at time of admission.  TRH has been consulted to place patient in the hospital for further NSTEMI management.  EKG demonstrated no acute ischemic changes; BNP is slightly elevated.  Review of Systems: As mentioned in the history of present illness. All other systems reviewed and are negative. Past Medical History:  Diagnosis Date   Anemia    Carotid artery stenosis    CKD (chronic kidney  disease)    COPD (chronic obstructive pulmonary disease) (HCC)    Dyspnea    Due to COPD per patient   Goiter diffuse, nontoxic    Per patient   Hepatitis C    History of radiation therapy    Left lung-10/10/21-10/17/21- Dr. Antony Blackbird   Hypertension    Monoclonal gammopathy of unknown significance (MGUS)    Pyelonephritis 04/06/2014   UTI   Past Surgical History:  Procedure Laterality Date   ABDOMINAL HYSTERECTOMY     CHOLECYSTECTOMY N/A 09/17/2018   Procedure: LAPAROSCOPIC CHOLECYSTECTOMY;  Surgeon: Lucretia Roers, MD;  Location: [facility];  Service: General;  Laterality: N/A;   FUDUCIAL PLACEMENT Left 09/01/2021   Procedure: PLACEMENT OF FIDUCIAL MARKERS TIMES FOUR;  Surgeon: Loreli Slot, MD;  Location: [facility] OR;  Service: Thoracic;  Laterality: Left;   TEE WITHOUT CARDIOVERSION N/A 04/10/2014   Procedure: TRANSESOPHAGEAL ECHOCARDIOGRAM (TEE);  Surgeon: Donato Schultz, MD;  Location: [facility] ENDOSCOPY;  Service: Cardiovascular;  Laterality: N/A;   VIDEO BRONCHOSCOPY WITH ENDOBRONCHIAL NAVIGATION N/A 09/01/2021   Procedure: VIDEO BRONCHOSCOPY WITH ENDOBRONCHIAL NAVIGATION;  Surgeon: Loreli Slot, MD;  Location: [facility] OR;  Service: Thoracic;  Laterality: N/A;   Social History:  reports that she has quit smoking. Her smoking use included cigarettes. She smoked an average of .25 packs per day. She has been exposed to tobacco smoke. She has never used smokeless tobacco. She reports that she does not drink alcohol and does not use drugs.  No Known Allergies  Family History  Problem Relation Age of Onset   Hypertension Mother    Thyroid disease Mother    Hypertension Father    Thyroid disease Sister    Thyroid disease  Brother    Thyroid disease Sister     Prior to Admission medications   Medication Sig Start Date End Date Taking? Authorizing Provider  amLODipine (NORVASC) 5 MG tablet Take 1 tablet (5 mg total) by mouth daily. 11/21/22   Johnson, Clanford L, MD  aspirin 81 MG  EC tablet Take 81 mg by mouth in the morning.    [provider]  calcium carbonate (OS-CAL) 1250 (500 Ca) MG chewable tablet Chew 1 tablet by mouth daily.    [provider]  Cholecalciferol (VITAMIN D3) 125 MCG (5000 UT) CAPS Take 5,000 Units by mouth daily.    [provider]  clopidogrel (PLAVIX) 75 MG tablet Take 1 tablet (75 mg total) by mouth daily. 11/22/22   Johnson, Clanford L, MD  feeding supplement (ENSURE ENLIVE / ENSURE PLUS) LIQD Take 237 mLs by mouth 2 (two) times daily between meals. 12/31/21   Sherryll Burger, Pratik D, DO  furosemide (LASIX) 20 MG tablet Take 1 tablet (20 mg total) by mouth daily. 12/08/22 03/08/23  Strader, Lennart Pall, PA-C  Ipratropium-Albuterol (COMBIVENT RESPIMAT) 20-100 MCG/ACT AERS respimat Inhale 1 puff into the lungs every 6 (six) hours as needed for wheezing or shortness of breath. 12/31/21   Sherryll Burger, Pratik D, DO  ipratropium-albuterol (DUONEB) 0.5-2.5 (3) MG/3ML SOLN Take 3 mLs by nebulization every 4 (four) hours as needed (wheezing/shortness of breath).    [provider]  Iron, Ferrous Sulfate, 325 (65 Fe) MG TABS Take 1 tablet by mouth daily.    [provider]  isosorbide mononitrate (IMDUR) 60 MG 24 hr tablet Take 1.5 tablets (90 mg total) by mouth daily. 12/12/22 01/11/23  [name], MD  metoprolol tartrate (LOPRESSOR) 25 MG tablet Take 1 tablet (25 mg total) by mouth 2 (two) times daily. 11/21/22   Cleora Fleet, MD  Multiple Vitamin (MULTIVITAMIN WITH MINERALS) TABS tablet Take 1 tablet by mouth daily. 11/22/22   Johnson, Clanford L, MD  nitroGLYCERIN (NITROSTAT) 0.4 MG SL tablet Place 1 tablet (0.4 mg total) under the tongue every 5 (five) minutes as needed for chest pain. 09/06/21 12/08/22  Pokhrel, Rebekah Chesterfield, MD  ondansetron (ZOFRAN) 4 MG tablet Take 1 tablet (4 mg total) by mouth every 6 (six) hours as needed for nausea. 09/06/21   Pokhrel, Rebekah Chesterfield, MD  rosuvastatin (CRESTOR) 20 MG tablet Take 1 tablet (20 mg total)  by mouth daily at 6 PM. 11/21/22   Cleora Fleet, MD    Physical Exam: Vitals:   12/16/22 1502 12/16/22 1535 12/16/22 1601 12/16/22 1852  BP: (!) 158/64 (!) 175/70 (!) 180/86 (!) 149/52  Pulse: 64 64 74 73  Resp: 15 18  20   Temp:   98 F (36.7 C) 97.9 F (36.6 C)  TempSrc:   Oral Oral  SpO2: 100% 100% 100% 100%  Weight:   47.9 kg   Height:   5\' 7"  (1.702 m)    General exam: Alert, awake, oriented x 3; feeling better after receiving nitroglycerin, temporary use of BiPAP, heparin drip and imdur. Respiratory system: Clear to auscultation. Respiratory effort normal.  No crackles, no wheezing.  Mild tachypnea at time of admission appreciated. Cardiovascular system:RRR. No rub or gallop; no JVD Gastrointestinal system: Abdomen is nondistended, soft and nontender. No organomegaly or masses felt. Normal bowel sounds heard. Central nervous system: Alert and oriented. No focal neurological deficits. Extremities: No cyanosis or clubbing. Skin: No petechiae. Psychiatry: Judgement and insight appear normal. Mood & affect appropriate.   Data Reviewed: CBC:  White blood cell 6.2, hemoglobin 8.21 platelet count 215 K Comprehensive metabolic panel: Sodium 137, potassium 4.1, chloride 105, bicarb 25, BUN 44, creatinine 1.47 and GFR 35 Troponin: 56 >> 2115 BNP: 526  Assessment and Plan: * Chest pain -Secondary to NSTEMI -Patient declining any intervention or invasive evaluation -Continue medical management with the use of beta-blocker, Imdur, 48 hours of heparin drip, as needed nitroglycerin and pending patient response initiation of Ranexa -Case will be discussed with cardiology service on Monday.   Adenocarcinoma of left lung (HCC) -Status post radiation therapy -No actively receiving any chemo at this point -Continue patient follow-up with oncology service -Patient expressed not wanting any further treatment.  CKD (chronic kidney disease) stage 4, GFR 15-29 ml/min (HCC) -Appears to  be stable and at baseline -Continue monitoring renal function trend -Minimize nephrotoxic agents as much as possible and maintain adequate hydration.  Moderate protein-calorie malnutrition (HCC) -Patient has been encouraged to maintain adequate oral intake and to use feeding supplements.  COPD (chronic obstructive pulmonary disease) (HCC) -No active wheezing appreciated -Continue bronchodilator management -Holding oral steroids at this point.  Hypertension -Stable and well-controlled -Continue current antihypertensive agents and follow vital signs -Heart healthy diet discussed with patient.      Advance Care Planning:   Code Status: DNR   Consults: None  Family Communication: No family at bedside.  Severity of Illness: The appropriate patient status for this patient is OBSERVATION. Observation status is judged to be reasonable and necessary in order to provide the required intensity of service to ensure the patient's safety. The patient's presenting symptoms, physical exam findings, and initial radiographic and laboratory data in the context of their medical condition is felt to place them at decreased risk for further clinical deterioration. Furthermore, it is anticipated that the patient will be medically stable for discharge from the hospital within 2 midnights of admission.   Author: Vassie Loll, MD 12/16/2022 7:44 PM  For on call review [email or web address].

## 2022-12-16 NOTE — ED Triage Notes (Signed)
Patient from home for chest pain that started at 0430. Patient reports she had an MI 3 weeks ago, but "they are not doing anything for it". Patient took 2 Nitroglycerin prior to EMS arrival. Upon arrival to ER, patient is alert and oriented, increased resp and work of breathing.

## 2022-12-16 NOTE — Assessment & Plan Note (Signed)
-  Stable and well-controlled -Continue current antihypertensive agents and follow vital signs -Heart healthy diet discussed with patient.

## 2022-12-17 DIAGNOSIS — J9620 Acute and chronic respiratory failure, unspecified whether with hypoxia or hypercapnia: Secondary | ICD-10-CM

## 2022-12-17 DIAGNOSIS — R079 Chest pain, unspecified: Secondary | ICD-10-CM | POA: Diagnosis present

## 2022-12-17 DIAGNOSIS — Z923 Personal history of irradiation: Secondary | ICD-10-CM | POA: Diagnosis not present

## 2022-12-17 DIAGNOSIS — Z87891 Personal history of nicotine dependence: Secondary | ICD-10-CM | POA: Diagnosis not present

## 2022-12-17 DIAGNOSIS — I252 Old myocardial infarction: Secondary | ICD-10-CM | POA: Diagnosis not present

## 2022-12-17 DIAGNOSIS — J438 Other emphysema: Secondary | ICD-10-CM | POA: Diagnosis not present

## 2022-12-17 DIAGNOSIS — Z7982 Long term (current) use of aspirin: Secondary | ICD-10-CM | POA: Diagnosis not present

## 2022-12-17 DIAGNOSIS — D631 Anemia in chronic kidney disease: Secondary | ICD-10-CM | POA: Diagnosis present

## 2022-12-17 DIAGNOSIS — Z7902 Long term (current) use of antithrombotics/antiplatelets: Secondary | ICD-10-CM | POA: Diagnosis not present

## 2022-12-17 DIAGNOSIS — Z8619 Personal history of other infectious and parasitic diseases: Secondary | ICD-10-CM | POA: Diagnosis not present

## 2022-12-17 DIAGNOSIS — Z9049 Acquired absence of other specified parts of digestive tract: Secondary | ICD-10-CM | POA: Diagnosis not present

## 2022-12-17 DIAGNOSIS — I129 Hypertensive chronic kidney disease with stage 1 through stage 4 chronic kidney disease, or unspecified chronic kidney disease: Secondary | ICD-10-CM | POA: Diagnosis present

## 2022-12-17 DIAGNOSIS — Z85118 Personal history of other malignant neoplasm of bronchus and lung: Secondary | ICD-10-CM | POA: Diagnosis not present

## 2022-12-17 DIAGNOSIS — R0603 Acute respiratory distress: Secondary | ICD-10-CM | POA: Diagnosis present

## 2022-12-17 DIAGNOSIS — Z90710 Acquired absence of both cervix and uterus: Secondary | ICD-10-CM | POA: Diagnosis not present

## 2022-12-17 DIAGNOSIS — D649 Anemia, unspecified: Secondary | ICD-10-CM | POA: Diagnosis not present

## 2022-12-17 DIAGNOSIS — Z66 Do not resuscitate: Secondary | ICD-10-CM | POA: Diagnosis present

## 2022-12-17 DIAGNOSIS — Z87440 Personal history of urinary (tract) infections: Secondary | ICD-10-CM | POA: Diagnosis not present

## 2022-12-17 DIAGNOSIS — Z79899 Other long term (current) drug therapy: Secondary | ICD-10-CM | POA: Diagnosis not present

## 2022-12-17 DIAGNOSIS — I214 Non-ST elevation (NSTEMI) myocardial infarction: Secondary | ICD-10-CM | POA: Diagnosis present

## 2022-12-17 DIAGNOSIS — Z8249 Family history of ischemic heart disease and other diseases of the circulatory system: Secondary | ICD-10-CM | POA: Diagnosis not present

## 2022-12-17 DIAGNOSIS — Z8349 Family history of other endocrine, nutritional and metabolic diseases: Secondary | ICD-10-CM | POA: Diagnosis not present

## 2022-12-17 DIAGNOSIS — D472 Monoclonal gammopathy: Secondary | ICD-10-CM | POA: Diagnosis present

## 2022-12-17 DIAGNOSIS — E049 Nontoxic goiter, unspecified: Secondary | ICD-10-CM | POA: Diagnosis present

## 2022-12-17 DIAGNOSIS — J449 Chronic obstructive pulmonary disease, unspecified: Secondary | ICD-10-CM | POA: Diagnosis present

## 2022-12-17 DIAGNOSIS — N184 Chronic kidney disease, stage 4 (severe): Secondary | ICD-10-CM | POA: Diagnosis present

## 2022-12-17 DIAGNOSIS — Z681 Body mass index (BMI) 19 or less, adult: Secondary | ICD-10-CM | POA: Diagnosis not present

## 2022-12-17 DIAGNOSIS — E440 Moderate protein-calorie malnutrition: Secondary | ICD-10-CM | POA: Diagnosis present

## 2022-12-17 LAB — HEPARIN LEVEL (UNFRACTIONATED): Heparin Unfractionated: 0.36 IU/mL (ref 0.30–0.70)

## 2022-12-17 LAB — CBC
HCT: 20.5 % — ABNORMAL LOW (ref 36.0–46.0)
HCT: 19.5 % — ABNORMAL LOW (ref 36.0–46.0)
HCT: 25.9 % — ABNORMAL LOW (ref 36.0–46.0)
Hemoglobin: 6.5 g/dL — CL (ref 12.0–15.0)
Hemoglobin: 6.3 g/dL — CL (ref 12.0–15.0)
Hemoglobin: 8.5 g/dL — ABNORMAL LOW (ref 12.0–15.0)
MCH: 30.8 pg (ref 26.0–34.0)
MCH: 31 pg (ref 26.0–34.0)
MCH: 30.2 pg (ref 26.0–34.0)
MCHC: 31.7 g/dL (ref 30.0–36.0)
MCHC: 32.3 g/dL (ref 30.0–36.0)
MCHC: 32.8 g/dL (ref 30.0–36.0)
MCV: 97.2 fL (ref 80.0–100.0)
MCV: 96.1 fL (ref 80.0–100.0)
MCV: 92.2 fL (ref 80.0–100.0)
Platelets: 173 10*3/uL (ref 150–400)
Platelets: 162 10*3/uL (ref 150–400)
Platelets: 158 10*3/uL (ref 150–400)
RBC: 2.11 MIL/uL — ABNORMAL LOW (ref 3.87–5.11)
RBC: 2.03 MIL/uL — ABNORMAL LOW (ref 3.87–5.11)
RBC: 2.81 MIL/uL — ABNORMAL LOW (ref 3.87–5.11)
RDW: 14.4 % (ref 11.5–15.5)
RDW: 14.6 % (ref 11.5–15.5)
RDW: 16.8 % — ABNORMAL HIGH (ref 11.5–15.5)
WBC: 4.6 10*3/uL (ref 4.0–10.5)
WBC: 5 10*3/uL (ref 4.0–10.5)
WBC: 5.6 10*3/uL (ref 4.0–10.5)
nRBC: 0 % (ref 0.0–0.2)
nRBC: 0 % (ref 0.0–0.2)
nRBC: 0 % (ref 0.0–0.2)

## 2022-12-17 LAB — PREPARE RBC (CROSSMATCH)

## 2022-12-17 LAB — BASIC METABOLIC PANEL
Anion gap: 7 (ref 5–15)
BUN: 56 mg/dL — ABNORMAL HIGH (ref 8–23)
CO2: 23 mmol/L (ref 22–32)
Calcium: 8.5 mg/dL — ABNORMAL LOW (ref 8.9–10.3)
Chloride: 104 mmol/L (ref 98–111)
Creatinine, Ser: 1.47 mg/dL — ABNORMAL HIGH (ref 0.44–1.00)
GFR, Estimated: 35 mL/min — ABNORMAL LOW (ref >60)
Glucose, Bld: 121 mg/dL — ABNORMAL HIGH (ref 70–99)
Potassium: 4.5 mmol/L (ref 3.5–5.1)
Sodium: 134 mmol/L — ABNORMAL LOW (ref 135–145)

## 2022-12-17 LAB — MAGNESIUM: Magnesium: 2.1 mg/dL (ref 1.7–2.4)

## 2022-12-17 LAB — PHOSPHORUS: Phosphorus: 4.4 mg/dL (ref 2.5–4.6)

## 2022-12-17 MED ORDER — SODIUM CHLORIDE 0.9% IV SOLUTION: Freq: Once | INTRAVENOUS | Status: AC

## 2022-12-17 NOTE — Progress Notes (Signed)
   12/17/22 0630  Provider Notification  Provider Name/Title O. Adefeso  Date Provider Notified 12/17/22  Time Provider Notified [phone]  Method of Notification Page  Notification Reason Critical Result  Test performed and critical result hgb 6.5  Date Critical Result Received 12/17/22  Time Critical Result Received 0619  Provider response See new orders  Date of Provider Response 12/17/22  Time of Provider Response 0645    MD notified of critical hgb of 6.5 this AM, down from 10.2 yesterday.  Also reminded that patient is on heparin drip.   No sign of active bleeding (no bm overnight, no nausea or emesis, no blood in urine canister).  Order placed to redraw H&H to confirm.  Pharmacy notified due to patient being on heparin drip.  Did not want heparin drip stopped due to no sign of active bleed, willing to wait on redraw and will defer to day shift pharmacy based on results.  Oncoming RN made aware.

## 2022-12-17 NOTE — Progress Notes (Signed)
CP resolved with nitroglycerin x 1 tab.

## 2022-12-17 NOTE — Progress Notes (Signed)
Progress Note   Patient: Lisa [name] WJX:[id number] DOB: 1935-02-28 DOA: 12/16/2022     0 DOS: the patient was seen and examined on 12/17/2022   Brief hospital admission narrative: Lisa [name] is a 88 y.o. female with medical history significant of chronic kidney disease a stage IIIb, hepatitis C, tobacco abuse, hypertension, COPD, malignant neoplasm of the left lung treated with radiation, smoldering myeloma, coronary disease, monoclonal gammopathy of unknown significance, chronic dyspnea, coronary artery disease, anemia of chronic disease, chronic diastolic heart failure and recent hospitalization secondary to NSTEMI; presented to the hospital with chest pain and shortness of breath.  Patient symptoms similar to previous presentation at that time and she has decided not to proceed any invasive treatment and was also found by cardiology service as not a candidate for cardiac catheterization.  There has not been any fever, productive coughing spells, sick contacts, hematuria, dysuria, melena, hemoptysis, focal weaknesses or any other complaints. Workup in the ED demonstrating no acute infiltrates, positive fibrotic changes especially in her left open lung most likely associated with radiation; there was no pulmonary embolism present. Elevated troponin appreciated and initial shortness of breath requiring BiPAP to stabilize her symptoms. Steroids, one-time dose of Lasix IV and heparin drip was initiated at time of admission.  TRH has been consulted to place patient in the hospital for further NSTEMI management.   EKG demonstrated no acute ischemic changes; BNP is slightly elevated.  Assessment and Plan: * Chest pain -Secondary to NSTEMI -Patient declining any intervention or invasive evaluation. -Continue medical management with the use of beta-blocker, Imdur, 48 hours of heparin drip, as needed nitroglycerin and pending patient response initiation of Ranexa after discussing with cardiology  service. -Continue supportive care.   Acute on chronic anemia -Hemoglobin down to 6.5 -1 unit PRBCs will be transfused -No signs of overt bleeding appreciated -Continue to follow hemoglobin trend.  Adenocarcinoma of left lung (HCC) -Status post radiation therapy -No actively receiving any chemo at this point -Continue patient follow-up with oncology service -Patient expressed not wanting any further treatment.  CKD (chronic kidney disease) stage 4, GFR 15-29 ml/min (HCC) -Appears to be stable and at baseline -Continue monitoring renal function trend -Minimize nephrotoxic agents as much as possible and maintain adequate hydration.  Moderate protein-calorie malnutrition (HCC) -Patient has been encouraged to maintain adequate oral intake and to use feeding supplements.  COPD (chronic obstructive pulmonary disease) (HCC) -No active wheezing appreciated -Continue bronchodilator management -Holding oral steroids at this point.  Hypertension -Stable and well-controlled -Continue current antihypertensive agents and follow vital signs -Heart healthy diet discussed with patient.    Subjective:  Chronically ill in appearance; underweight and reporting no nausea, vomiting or chest pain currently.  Daughter and breakfast expressed having some discomfort in her chest that subsided after receiving treatment with sublingual nitroglycerin.    Physical Exam: Vitals:   12/17/22 1049 12/17/22 1050 12/17/22 1300 12/17/22 1345  BP: (!) 153/96 (!) 153/96 (!) 186/67 (!) 174/68  Pulse: 67 67 66 72  Resp: 20 20 18 18   Temp: 98.9 F (37.2 C) 98.9 F (37.2 C) 98.9 F (37.2 C) 99 F (37.2 C)  TempSrc: Oral  Oral Oral  SpO2: 100%  100% 100%  Weight:      Height:       General exam: Alert, awake, oriented x 3; no chest pain, no nausea or vomiting.  Reports experiencing earlier discomfort that went away after receiving sublingual nitroglycerin. Respiratory system: Good air movement  bilaterally.  No wheezing, no crackles. Cardiovascular system:RRR. No rubs or gallops. Gastrointestinal system: Abdomen is nondistended, soft and nontender. No organomegaly or masses felt. Normal bowel sounds heard. Central nervous system: No focal neurological deficits. Extremities: No cyanosis or clubbing. Skin: No petechiae. Psychiatry: Judgement and insight appear normal. Mood & affect appropriate.   Data Reviewed: CBC: White blood cell 4.6, hemoglobin 6.5, platelet count 173 K Magnesium: 2.1 Phosphorus: 4.4 Basic metabolic panel: Sodium 134, potassium 4.5, chloride 104, bicarb 23, BUN 56, creatinine 1.47, GFR 35  Family Communication: no family at bedside; unable to reach niece over the phone.  Disposition: Status is: Inpatient Remains inpatient appropriate because: Continue treatment with IV heparin, provide blood transfusion to stabilize hemoglobin and continue management for ongoing NSTEMI.   Planned Discharge Destination: Home  Time spent: 50 minutes  Author: Vassie Loll, MD 12/17/2022 6:25 PM  For on call review [email or web address].

## 2022-12-17 NOTE — Plan of Care (Signed)

## 2022-12-17 NOTE — Progress Notes (Signed)
ANTICOAGULATION CONSULT NOTE - Follow Up Consult  Pharmacy Consult for heparin Indication: chest pain/ACS  No Known Allergies  Patient Measurements: Height: 5\' 7"  (170.2 cm) Weight: 46.9 kg (103 lb 6.3 oz) IBW/kg (Calculated) : 61.6 Heparin Dosing Weight: 47 kg  Vital Signs: Temp: 98.7 F (37.1 C) (07/07 0500) Temp Source: Oral (07/07 0500) BP: 172/66 (07/07 0500) Pulse Rate: 71 (07/07 0500)  Labs: Recent Labs    12/16/22 0628 12/16/22 1610 12/16/22 0837 12/16/22 2012 12/17/22 0446 12/17/22 0741  HGB 8.2* 10.2*  --   --  6.5* 6.3*  HCT 26.3* 30.0*  --   --  20.5* 19.5*  PLT 215  --   --   --  173 162  HEPARINUNFRC  --   --   --  0.44 0.36  --   CREATININE 1.47* 1.40*  --   --  1.47*  --   TROPONINIHS 56*  --  2,115*  --   --   --      Estimated Creatinine Clearance: 20.7 mL/min (A) (by C-G formula based on SCr of 1.47 mg/dL (H)).   Assessment: 20 year year old female readmitted to [facility] with chest pain and elevated troponin. Patient had similar presentation last month and was felt too high risk for procedures. Will add back IV heparin this morning.   Heparin level this morning is therapeutic at low end of goal at 0.36. No issues with infusion or bleeding reported, however hemoglobin has significantly dropped from 10.2 on admit (patient previously 7-8s) to 6.5 and 6.3 on stat recheck. MD aware.   Will decrease heparin rate to 600 units/hr to keep patient on lower side of therapeutic.   Goal of Therapy:  Heparin level 0.3-0.5 units/ml Monitor platelets by anticoagulation protocol: Yes   Plan:  Decrease heparin infusion to 600 units/hr Check anti-Xa level daily while on heparin Continue to monitor H&H and platelets closely  Thank you for allowing pharmacy to be a part of this patient's care.   Sheppard Coil PharmD., BCPS Clinical Pharmacist 12/17/2022 8:38 AM

## 2022-12-17 NOTE — Progress Notes (Signed)
Lab called with critical lab Hgb 6.3, plans to transfuse PRBC's this shift. Slight leakage to blood draw site since 12/16/2022 in the L AC,  pressure dressing applied. MD aware.  0840: pt called out to the desk with report of cp and requests nitroglycerin SL. VS obtained, and WNL. Medication administered per order.

## 2022-12-17 NOTE — Assessment & Plan Note (Addendum)
-  Hemoglobin down to 6.5; no signs of overt bleeding appreciated -1 unit PRBCs transfused -Hemoglobin is stable at 8.5 at discharge.

## 2022-12-17 NOTE — TOC Initial Note (Addendum)
Transition of Care [facility]) - Initial/Assessment Note    Patient Details  Name: Lisa [name] MRN: [id number] Date of Birth: Aug 13, 1934  Transition of Care (TOC) CM/SW Contact:    Catalina Gravel, LCSW Phone Number: 12/17/2022, 3:32 PM  Clinical Narrative:                 CSW attempted to assess pt at bedside due to  readmission risk, pt sleeping did not wake on prompt-tired.  Contacted niece who is a primary caretaker.  Patient lives alone, uses first floor of home only, not ambulate to second level. Pt could benefit from a hospital bed for comfort and to assist. Pt not as mobile currently. Sleeping in a recliner past year.   Family members come in home to assist with  all ADLS, med set up and provide, food prep, meals and transportation to appointments.  Pt has allowed services in home, then she will end them abruptly.  Family wants palliative engaged now due to "co-morbidities".  PCP is Dr. Durwin Nora and there is a scheduled appointment soon with Mardene Celeste to discuss.   TOC to follow.   Expected Discharge Plan: Home/Self Care Barriers to Discharge: Continued Medical Work up   Patient Goals and CMS Choice Patient states their goals for this hospitalization and ongoing recovery are:: Pt was resting in bed- Niece is a primary caretaker- plan is home- would like to look into palliative care          Expected Discharge Plan and Services In-house Referral: Clinical Social Work Discharge Planning Services: Other - See comment (Family interested in palliative care with Dr. Durwin Nora PCP involved in care.)   Living arrangements for the past 2 months: Single Family Home (Stays on first floor)                                      Prior Living Arrangements/Services Living arrangements for the past 2 months: Single Family Home (Stays on first floor) Lives with:: Self Patient language and need for interpreter reviewed:: Yes        Need for Family Participation in Patient Care: Yes  (Comment) Care giver support system in place?: Yes (comment) (Several relatives assist with care) Current home services: DME Criminal Activity/Legal Involvement Pertinent to Current Situation/Hospitalization: No - Comment as needed  Activities of Daily Living      Permission Sought/Granted                  Emotional Assessment Appearance:: Appears stated age Attitude/Demeanor/Rapport: Unable to Assess (pt sleeping, did not wake on prompt- resting.) Affect (typically observed): Unable to Assess Orientation: : Oriented to Self, Oriented to Situation Alcohol / Substance Use: Not Applicable Psych Involvement: No (comment)  Admission diagnosis:  NSTEMI (non-ST elevated myocardial infarction) (HCC) [I21.4] Chest pain [R07.9] Acute on chronic respiratory failure, unspecified whether with hypoxia or hypercapnia (HCC) [J96.20] Patient Active Problem List   Diagnosis Date Noted   CAD (coronary artery disease) 12/08/2022   Impaired hearing 11/24/2022   Overgrown toenails 11/24/2022   Goals of care, counseling/discussion 11/24/2022   Acute congestive heart failure (HCC) 11/18/2022   Acute on chronic heart failure with preserved ejection fraction (HCC) 11/17/2022   Acute hypoxic respiratory failure (HCC) 11/15/2022   Acute respiratory distress 11/15/2022   Need for pneumococcal 20-valent conjugate vaccination 10/16/2022   Dysmetria 09/04/2022   Encounter for general adult medical examination with  abnormal findings 09/04/2022   COVID-19 12/29/2021   COVID-19 virus infection 12/28/2021   Elevated brain natriuretic peptide (BNP) level 12/28/2021   Elevated troponin 12/28/2021   Chronic kidney disease, stage 3b (HCC) 12/28/2021   FTT (failure to thrive) in adult 12/28/2021   Underweight 12/28/2021   Pain and swelling of forearm, left 12/28/2021   COPD with acute exacerbation (HCC) 12/27/2021   Malignant neoplasm of upper lobe of left lung (HCC) 09/15/2021   Adenocarcinoma of left  lung (HCC) 09/11/2021   Multinodular goiter 09/05/2021   Odynophagia 09/03/2021   Mass of upper lobe of left lung 09/03/2021   Hypertensive urgency 09/02/2021   Chest pain 09/02/2021   CKD (chronic kidney disease) stage 4, GFR 15-29 ml/min (HCC) 09/02/2021   Tobacco abuse 09/02/2021   Myeloma (HCC) 09/02/2021   Non-ST elevation (NSTEMI) myocardial infarction [facility])    Demand ischemia    Iron deficiency anemia 07/29/2021   Anemia due to stage 4 chronic kidney disease (HCC) 07/22/2021   Chronic hepatitis C (HCC) 07/22/2021   Smoldering multiple myeloma 06/21/2021   Abnormal results of liver function studies 04/19/2021   Calculus of gallbladder without cholecystitis without obstruction    Gallstone pancreatitis 09/13/2018   Protein-calorie malnutrition, severe (HCC) 05/09/2014   Sepsis (HCC) 05/07/2014   Encephalopathy 05/07/2014   Back pain 05/07/2014   Bacteremia    COLD (chronic obstructive lung disease) (HCC)    Viridans streptococci infection    H/O goiter 04/10/2014   Moderate protein-calorie malnutrition (HCC) 04/10/2014   Acute encephalopathy 04/09/2014   Streptococcus viridans infection 04/08/2014   Leukocytosis 04/07/2014   Dehydration 04/07/2014   Fever 04/06/2014   UTI (lower urinary tract infection) 04/06/2014   ARF (acute renal failure) (HCC) 04/06/2014   Acute nontraumatic kidney injury (HCC) 04/06/2014   Hypertension    COPD (chronic obstructive pulmonary disease) (HCC)    PCP:  [name], MD Pharmacy:   [facility] [place] [place] - [address] [place] [address] Phone: [phone] Fax: [phone]     Social Determinants of Health (SDOH) Social History: SDOH Screenings   Food Insecurity: No Food Insecurity (12/01/2022)  Housing: Low Risk  (12/01/2022)  Transportation Needs: No Transportation Needs (12/01/2022)  Utilities: Not At Risk (12/01/2022)  Alcohol Screen: Low Risk  (10/24/2022)  Depression (PHQ2-9): Low  Risk  (12/12/2022)  Financial Resource Strain: Low Risk  (10/24/2022)  Physical Activity: Inactive (10/24/2022)  Social Connections: Moderately Isolated (12/01/2022)  Stress: No Stress Concern Present (12/01/2022)  Tobacco Use: Medium Risk (12/16/2022)   SDOH Interventions:     Readmission Risk Interventions    12/17/2022    3:16 PM 11/16/2022    1:13 PM 09/06/2021    9:23 AM  Readmission Risk Prevention Plan  Transportation Screening  Complete Complete  PCP or Specialist Appt within 3-5 Days   Complete  HRI or Home Care Consult  Complete Complete  Social Work Consult for Recovery Care Planning/Counseling  Complete Complete  Palliative Care Screening  Complete Not Applicable  Medication Review Oceanographer) Complete Complete Complete  PCP or Specialist appointment within 3-5 days of discharge Complete

## 2022-12-18 ENCOUNTER — Other Ambulatory Visit (HOSPITAL_COMMUNITY): Payer: Self-pay

## 2022-12-18 ENCOUNTER — Telehealth: Payer: Self-pay | Admitting: Internal Medicine

## 2022-12-18 ENCOUNTER — Encounter (HOSPITAL_COMMUNITY): Payer: Self-pay | Admitting: Hematology

## 2022-12-18 DIAGNOSIS — E440 Moderate protein-calorie malnutrition: Secondary | ICD-10-CM | POA: Diagnosis not present

## 2022-12-18 DIAGNOSIS — D649 Anemia, unspecified: Secondary | ICD-10-CM | POA: Diagnosis not present

## 2022-12-18 DIAGNOSIS — J9620 Acute and chronic respiratory failure, unspecified whether with hypoxia or hypercapnia: Secondary | ICD-10-CM | POA: Diagnosis not present

## 2022-12-18 DIAGNOSIS — R079 Chest pain, unspecified: Secondary | ICD-10-CM | POA: Diagnosis not present

## 2022-12-18 LAB — BPAM RBC
Blood Product Expiration Date: 202408102359
ISSUE DATE / TIME: 202407071030
Unit Type and Rh: 5100

## 2022-12-18 LAB — TYPE AND SCREEN
ABO/RH(D): O POS
Antibody Screen: NEGATIVE
Unit division: 0

## 2022-12-18 LAB — CBC
HCT: 26.7 % — ABNORMAL LOW (ref 36.0–46.0)
Hemoglobin: 8.5 g/dL — ABNORMAL LOW (ref 12.0–15.0)
MCH: 29.9 pg (ref 26.0–34.0)
MCHC: 31.8 g/dL (ref 30.0–36.0)
MCV: 94 fL (ref 80.0–100.0)
Platelets: 160 K/uL (ref 150–400)
RBC: 2.84 MIL/uL — ABNORMAL LOW (ref 3.87–5.11)
RDW: 16.8 % — ABNORMAL HIGH (ref 11.5–15.5)
WBC: 4.7 K/uL (ref 4.0–10.5)
nRBC: 0 % (ref 0.0–0.2)

## 2022-12-18 LAB — HEPARIN LEVEL (UNFRACTIONATED): Heparin Unfractionated: 0.3 IU/mL (ref 0.30–0.70)

## 2022-12-18 MED ORDER — RANOLAZINE ER 500 MG PO TB12
500.0000 mg | ORAL_TABLET | Freq: Two times a day (BID) | ORAL | Status: DC
  Administered 2022-12-18 – 2022-12-19 (×2): 500 mg via ORAL
  Filled 2022-12-18 (×2): qty 1

## 2022-12-18 NOTE — Progress Notes (Signed)
Progress Note   Patient: Lisa [name] UJW:[id number] DOB: [birth year] DOA: 12/16/2022     1 DOS: the patient was seen and examined on 12/18/2022   Brief hospital admission narrative: Lisa [name] is a 88 y.o. female with medical history significant of chronic kidney disease a stage IIIb, hepatitis C, tobacco abuse, hypertension, COPD, malignant neoplasm of the left lung treated with radiation, smoldering myeloma, coronary disease, monoclonal gammopathy of unknown significance, chronic dyspnea, coronary artery disease, anemia of chronic disease, chronic diastolic heart failure and recent hospitalization secondary to NSTEMI; presented to the hospital with chest pain and shortness of breath.  Patient symptoms similar to previous presentation at that time and she has decided not to proceed any invasive treatment and was also found by cardiology service as not a candidate for cardiac catheterization.  There has not been any fever, productive coughing spells, sick contacts, hematuria, dysuria, melena, hemoptysis, focal weaknesses or any other complaints. Workup in the ED demonstrating no acute infiltrates, positive fibrotic changes especially in her left open lung most likely associated with radiation; there was no pulmonary embolism present. Elevated troponin appreciated and initial shortness of breath requiring BiPAP to stabilize her symptoms. Steroids, one-time dose of Lasix IV and heparin drip was initiated at time of admission.  TRH has been consulted to place patient in the hospital for further NSTEMI management.   EKG demonstrated no acute ischemic changes; BNP is slightly elevated.  Assessment and Plan: * Chest pain -Secondary to NSTEMI -Patient declining any intervention or invasive evaluation. -Continue medical management with the use of beta-blocker, Imdur, and 48 hours of heparin drip. -Initiating Ranexa to assist with ongoing chest discomfort. -Continue supportive care.   Acute on  chronic anemia -Hemoglobin down to 6.5 -1 unit PRBCs will be transfused -No signs of overt bleeding appreciated -Continue to follow hemoglobin trend.  Adenocarcinoma of left lung (HCC) -Status post radiation therapy -No actively receiving any chemo at this point -Continue patient follow-up with oncology service -Patient expressed not wanting any further treatment.  CKD (chronic kidney disease) stage 4, GFR 15-29 ml/min (HCC) -Appears to be stable and at baseline -Continue monitoring renal function trend -Minimize nephrotoxic agents as much as possible and maintain adequate hydration.  Moderate protein-calorie malnutrition (HCC) -Patient has been encouraged to maintain adequate oral intake and to use feeding supplements.  COPD (chronic obstructive pulmonary disease) (HCC) -No active wheezing appreciated -Continue bronchodilator management -Holding oral steroids at this point.  Hypertension -Stable and well-controlled -Continue current antihypertensive agents and follow vital signs -Heart healthy diet discussed with patient.    Subjective:  In no acute distress at time of examination; no nausea, no vomiting, no shortness of breath.  Reporting transient chest discomfort overnight.   Physical Exam: Vitals:   12/17/22 2047 12/18/22 0429 12/18/22 0500 12/18/22 1453  BP: (!) 163/53 (!) 173/66  (!) 166/61  Pulse: 61 (!) 59  60  Resp: 17 16    Temp: 98.9 F (37.2 C) 98.2 F (36.8 C)  97.6 F (36.4 C)  TempSrc: Oral Oral  Oral  SpO2: 100% 100%  97%  Weight:   51.4 kg   Height:       General exam: Alert, awake, oriented x 3, reports very little chest discomfort overnight; no nausea, no vomiting, no shortness of breath. Respiratory system: Good air movement bilaterally; no using accessory muscles.  2 L nasal cannula in place demonstrating good saturation. Cardiovascular system:RRR. No rubs or gallop; no JVD. Gastrointestinal system: Abdomen  is nondistended, soft and  nontender. No organomegaly or masses felt. Normal bowel sounds heard. Central nervous system:  No focal neurological deficits. Extremities: No cyanosis or clubbing. Skin: No petechiae. Psychiatry: Judgement and insight appear normal. Mood & affect appropriate.   Data Reviewed: CBC: White blood cell 4.7, hemoglobin 8.5 and platelet count 160 K. Basic metabolic panel: Sodium 134, potassium 4.5, chloride 104, bicarb 23, BUN 56, creatinine 1.47, GFR 35  Family Communication: no family at bedside; unable to reach niece over the phone.  Disposition: Status is: Inpatient Remains inpatient appropriate because: Continue treatment with IV heparin, provide blood transfusion to stabilize hemoglobin and continue management for ongoing NSTEMI.   Planned Discharge Destination: Home  Time spent: 50 minutes  Author: Vassie Loll, MD 12/18/2022 4:42 PM  For on call review [email or web address].

## 2022-12-18 NOTE — TOC Benefit Eligibility Note (Signed)
Pharmacy Patient Advocate Encounter  Insurance verification completed.    The patient is insured through [facility] [employer] [facility] [employer] Medicare Part D  Ran test claim for ranolazine (Ranexa) 500 mg 12 hour tablet and the current 30 day co-pay is $37.14.   This test claim was processed through [facility]- copay amounts may vary at other pharmacies due to pharmacy/plan contracts, or as the patient moves through the different stages of their insurance plan.    Roland Earl, CPHT Pharmacy Patient Advocate Specialist [facility] Patient Advocate Team Direct Number: [phone]  Fax: [phone]

## 2022-12-18 NOTE — Plan of Care (Signed)
  Problem: Education: Goal: Knowledge of General Education information will improve Description: Including pain rating scale, medication(s)/side effects and non-pharmacologic comfort measures Outcome: Progressing   Problem: Health Behavior/Discharge Planning: Goal: Ability to manage health-related needs will improve Outcome: Progressing   Problem: Clinical Measurements: Goal: Will remain free from infection Outcome: Progressing Goal: Respiratory complications will improve Outcome: Progressing Goal: Cardiovascular complication will be avoided Outcome: Progressing   Problem: Activity: Goal: Risk for activity intolerance will decrease Outcome: Progressing   Problem: Nutrition: Goal: Adequate nutrition will be maintained Outcome: Progressing   Problem: Coping: Goal: Level of anxiety will decrease Outcome: Progressing   Problem: Elimination: Goal: Will not experience complications related to bowel motility Outcome: Progressing Goal: Will not experience complications related to urinary retention Outcome: Progressing   Problem: Pain Managment: Goal: General experience of comfort will improve Outcome: Progressing   Problem: Safety: Goal: Ability to remain free from injury will improve Outcome: Progressing   Problem: Clinical Measurements: Goal: Ability to maintain clinical measurements within normal limits will improve Outcome: Not Progressing Goal: Diagnostic test results will improve Outcome: Not Progressing

## 2022-12-18 NOTE — Telephone Encounter (Signed)
FYI  Admitted to hospital

## 2022-12-18 NOTE — Progress Notes (Signed)
ANTICOAGULATION CONSULT NOTE - Follow Up Consult  Pharmacy Consult for heparin Indication: chest pain/ACS  No Known Allergies  Patient Measurements: Height: 5\' 7"  (170.2 cm) Weight: 51.4 kg (113 lb 5.1 oz) IBW/kg (Calculated) : 61.6 Heparin Dosing Weight: 47 kg  Vital Signs: Temp: 98.2 F (36.8 C) (07/08 0429) Temp Source: Oral (07/08 0429) BP: 173/66 (07/08 0429) Pulse Rate: 59 (07/08 0429)  Labs: Recent Labs    12/16/22 4098 12/16/22 1191 12/16/22 4782 12/16/22 2012 12/17/22 0446 12/17/22 0741 12/17/22 1811 12/18/22 0451  HGB 8.2* 10.2*  --   --  6.5* 6.3* 8.5* 8.5*  HCT 26.3* 30.0*  --   --  20.5* 19.5* 25.9* 26.7*  PLT 215  --   --   --  173 162 158 160  HEPARINUNFRC  --   --   --  0.44 0.36  --   --  0.30  CREATININE 1.47* 1.40*  --   --  1.47*  --   --   --   TROPONINIHS 56*  --  2,115*  --   --   --   --   --      Estimated Creatinine Clearance: 22.7 mL/min (A) (by C-G formula based on SCr of 1.47 mg/dL (H)).   Assessment: 33 year year old female readmitted to [facility] with chest pain and elevated troponin. Patient had similar presentation last month and was felt too high risk for procedures. Will add back IV heparin this morning.   Heparin level this morning is therapeutic at low end of goal at 0.3. No issues with infusion or bleeding reported, hemoglobin up to 8.5 after transfusion.  Goal of Therapy:  Heparin level 0.3-0.5 units/ml Monitor platelets by anticoagulation protocol: Yes   Plan:  Continue heparin infusion at 600 units/hr for 48 hours - stops this morning  Thank you for allowing pharmacy to be a part of this patient's care.   Sheppard Coil PharmD., BCPS Clinical Pharmacist 12/18/2022 8:10 AM

## 2022-12-18 NOTE — Progress Notes (Signed)
PT is alert and oriented x4. PT has been ambulated to the chair and has sat up most of this shift. Pt has denied any pain or discomfort.

## 2022-12-19 ENCOUNTER — Telehealth: Payer: Self-pay | Admitting: Internal Medicine

## 2022-12-19 ENCOUNTER — Encounter: Payer: Self-pay | Admitting: *Deleted

## 2022-12-19 ENCOUNTER — Ambulatory Visit: Payer: Self-pay | Admitting: *Deleted

## 2022-12-19 ENCOUNTER — Other Ambulatory Visit: Payer: Self-pay

## 2022-12-19 DIAGNOSIS — J438 Other emphysema: Secondary | ICD-10-CM | POA: Diagnosis not present

## 2022-12-19 DIAGNOSIS — I214 Non-ST elevation (NSTEMI) myocardial infarction: Secondary | ICD-10-CM | POA: Diagnosis not present

## 2022-12-19 DIAGNOSIS — R079 Chest pain, unspecified: Secondary | ICD-10-CM | POA: Diagnosis not present

## 2022-12-19 DIAGNOSIS — D472 Monoclonal gammopathy: Secondary | ICD-10-CM

## 2022-12-19 DIAGNOSIS — N184 Chronic kidney disease, stage 4 (severe): Secondary | ICD-10-CM | POA: Diagnosis not present

## 2022-12-19 LAB — CBC
HCT: 27.2 % — ABNORMAL LOW (ref 36.0–46.0)
Hemoglobin: 8.5 g/dL — ABNORMAL LOW (ref 12.0–15.0)
MCH: 29.2 pg (ref 26.0–34.0)
MCHC: 31.3 g/dL (ref 30.0–36.0)
MCV: 93.5 fL (ref 80.0–100.0)
Platelets: 160 10*3/uL (ref 150–400)
RBC: 2.91 MIL/uL — ABNORMAL LOW (ref 3.87–5.11)
RDW: 16.3 % — ABNORMAL HIGH (ref 11.5–15.5)
WBC: 3.7 10*3/uL — ABNORMAL LOW (ref 4.0–10.5)
nRBC: 0 % (ref 0.0–0.2)

## 2022-12-19 MED ORDER — RANOLAZINE ER 500 MG PO TB12: 500.0000 mg | ORAL_TABLET | Freq: Two times a day (BID) | ORAL | 1 refills | Status: DC

## 2022-12-19 NOTE — Assessment & Plan Note (Signed)
-  Patient completed treatment with 48 hours of heparin drip, aspirin, beta-blocker, Imdur and statin. -Chest pain-free at discharge after being initiated on Ranexa. -Planning to continue as needed use of nitroglycerin in the event of chest discomfort. -Continue outpatient follow-up with cardiology service.

## 2022-12-19 NOTE — Care Management Important Message (Signed)
Important Message  Patient Details  Name: Lisa [name] MRN: [id number] Date of Birth: [birth year]   Medicare Important Message Given:  N/A - LOS <3 / Initial given by admissions     Corey Harold 12/19/2022, 3:45 PM

## 2022-12-19 NOTE — Discharge Summary (Signed)
Physician Discharge Summary   Patient: Lisa [name] MRN: [id number] DOB: 1934/08/17  Admit date:     12/16/2022  Discharge date: 12/19/22  Discharge Physician: Vassie Loll   PCP: [name], MD   Recommendations at discharge:  Repeat CBC to follow Hgb trend and stability. Repeat BMET to follow electrolytes and renal function. Reassess BP and adjust antihypertensive regimen as needed.  Discharge Diagnoses: Principal Problem:   Chest pain Active Problems:   Hypertension   COPD (chronic obstructive pulmonary disease) (HCC)   Moderate protein-calorie malnutrition (HCC)   CKD (chronic kidney disease) stage 4, GFR 15-29 ml/min (HCC)   NSTEMI (non-ST elevated myocardial infarction) (HCC)   Adenocarcinoma of left lung (HCC)   Acute on chronic anemia  Brief hospital admission narrative: Lisa [name] is a 88 y.o. female with medical history significant of chronic kidney disease a stage IIIb, hepatitis C, tobacco abuse, hypertension, COPD, malignant neoplasm of the left lung treated with radiation, smoldering myeloma, coronary disease, monoclonal gammopathy of unknown significance, chronic dyspnea, coronary artery disease, anemia of chronic disease, chronic diastolic heart failure and recent hospitalization secondary to NSTEMI; presented to the hospital with chest pain and shortness of breath.  Patient symptoms similar to previous presentation at that time and she has decided not to proceed any invasive treatment and was also found by cardiology service as not a candidate for cardiac catheterization.  There has not been any fever, productive coughing spells, sick contacts, hematuria, dysuria, melena, hemoptysis, focal weaknesses or any other complaints. Workup in the ED demonstrating no acute infiltrates, positive fibrotic changes especially in her left open lung most likely associated with radiation; there was no pulmonary embolism present. Elevated troponin appreciated and  initial shortness of breath requiring BiPAP to stabilize her symptoms. Steroids, one-time dose of Lasix IV and heparin drip was initiated at time of admission.  TRH has been consulted to place patient in the hospital for further NSTEMI management.   EKG demonstrated no acute ischemic changes; BNP is slightly elevated.  Assessment and Plan: * Chest pain -Secondary to NSTEMI -Patient declining any intervention or invasive evaluation. -On previous record due to chronic renal failure she was found not a candidate for cardiac catheterization or amenable candidate for dialysis if cardiac cath pursued. -Patient completed medical management with the use of beta-blocker, Imdur, and 48 hours of heparin drip. -At discharge we will continue the use of aspirin, Plavix, statin, beta-blocker, Imdur and Ranexa -Advised to maintain adequate hydration and after further discussing with patient and her niece (healthcare power of attorney) will arrange for palliative care follow-up as an outpatient.   Acute on chronic anemia -Hemoglobin down to 6.5; no signs of overt bleeding appreciated -1 unit PRBCs transfused -Hemoglobin is stable at 8.5 at discharge.  Adenocarcinoma of left lung (HCC) -Status post radiation therapy -No actively receiving any chemo at this point -Continue patient follow-up with oncology service -Patient expressed not wanting any further treatment.  NSTEMI (non-ST elevated myocardial infarction) [facility]) -Patient completed treatment with 48 hours of heparin drip, aspirin, beta-blocker, Imdur and statin. -Chest pain-free at discharge after being initiated on Ranexa. -Planning to continue as needed use of nitroglycerin in the event of chest discomfort. -Continue outpatient follow-up with cardiology service.  CKD (chronic kidney disease) stage 4, GFR 15-29 ml/min (HCC) -Appears to be stable and at baseline -Continue monitoring renal function trend -Continue to minimize nephrotoxic agents  as much as possible and maintain adequate hydration. -Repeat basic metabolic panel to  follow renal function and electrolytes trend/stability.  Moderate protein-calorie malnutrition (HCC) -Patient has been encouraged to maintain adequate oral intake and to use feeding supplements.  COPD (chronic obstructive pulmonary disease) (HCC) -No active wheezing appreciated -Continue bronchodilator management -No need for steroids management at this time   Hypertension -Stable and well-controlled -Continue current antihypertensive agents and follow vital signs -Heart healthy diet discussed with patient.    Consultants: Cardiology curbside. Procedures performed: See below for x-ray reports. Disposition: Home Diet recommendation: Heart healthy diet.  Feeding supplements encouraged.  DISCHARGE MEDICATION: Allergies as of 12/19/2022   No Known Allergies      Medication List     STOP taking these medications    amLODipine 5 MG tablet Commonly known as: NORVASC       TAKE these medications    aspirin EC 81 MG tablet Take 81 mg by mouth daily.   clopidogrel 75 MG tablet Commonly known as: PLAVIX Take 1 tablet (75 mg total) by mouth daily.   feeding supplement Liqd Take 237 mLs by mouth 2 (two) times daily between meals.   furosemide 20 MG tablet Commonly known as: LASIX Take 1 tablet (20 mg total) by mouth daily.   ipratropium-albuterol 0.5-2.5 (3) MG/3ML Soln Commonly known as: DUONEB Take 3 mLs by nebulization every 4 (four) hours as needed (wheezing/shortness of breath).   Combivent Respimat 20-100 MCG/ACT Aers respimat Generic drug: Ipratropium-Albuterol Inhale 1 puff into the lungs every 6 (six) hours as needed for wheezing or shortness of breath.   Iron (Ferrous Sulfate) 325 (65 Fe) MG Tabs Take 325 mg by mouth daily.   isosorbide mononitrate 60 MG 24 hr tablet Commonly known as: IMDUR Take 1.5 tablets (90 mg total) by mouth daily.   metoprolol tartrate 25 MG  tablet Commonly known as: LOPRESSOR Take 1 tablet (25 mg total) by mouth 2 (two) times daily.   multivitamin with minerals Tabs tablet Take 1 tablet by mouth daily.   nitroGLYCERIN 0.4 MG SL tablet Commonly known as: NITROSTAT Place 1 tablet (0.4 mg total) under the tongue every 5 (five) minutes as needed for chest pain.   ranolazine 500 MG 12 hr tablet Commonly known as: RANEXA Take 1 tablet (500 mg total) by mouth 2 (two) times daily.   rosuvastatin 20 MG tablet Commonly known as: CRESTOR Take 1 tablet (20 mg total) by mouth daily at 6 PM.   TUMS PO Take 1 tablet by mouth daily.   Vitamin D3 125 MCG (5000 UT) Caps Take 5,000 Units by mouth daily.        Follow-up Information     [name], MD. Schedule an appointment as soon as possible for a visit in 10 day(s).   Specialty: Internal Medicine Contact information: [address] [place] [address] [phone]                Discharge Exam: Ceasar Mons Weights   12/17/22 0500 12/18/22 0500 12/19/22 0113  Weight: 46.9 kg 51.4 kg 51 kg   General exam: Alert, awake, oriented x 3, no fever, no chest pain, no nausea or vomiting.  Patient reports no shortness of breath and no chest pain. Respiratory system: Good air movement bilaterally; no using accessory muscles.  No requiring oxygen supplementation during desaturation screening and able to speak in full sentences. Cardiovascular system:RRR. No rubs or gallop; no JVD. Gastrointestinal system: Abdomen is nondistended, soft and nontender. No organomegaly or masses felt. Normal bowel sounds heard. Central nervous system:  No focal neurological deficits. Extremities: No cyanosis or clubbing. Skin: No petechiae. Psychiatry: Judgement and insight appear normal. Mood & affect appropriate.   Condition at discharge: Stable and improved.  The results of significant diagnostics from this hospitalization (including imaging, microbiology, ancillary and  laboratory) are listed below for reference.   Imaging Studies: CT Angio Chest Pulmonary Embolism (PE) W or WO Contrast  Result Date: 12/16/2022 CLINICAL DATA:  Pulmonary embolism (PE) suspected, high prob. History of non-small cell lung cancer EXAM: CT ANGIOGRAPHY CHEST WITH CONTRAST TECHNIQUE: Multidetector CT imaging of the chest was performed using the standard protocol during bolus administration of intravenous contrast. Multiplanar CT image reconstructions and MIPs were obtained to evaluate the vascular anatomy. RADIATION DOSE REDUCTION: This exam was performed according to the departmental dose-optimization program which includes automated exposure control, adjustment of the mA and/or kV according to patient size and/or use of iterative reconstruction technique. CONTRAST:  60mL OMNIPAQUE IOHEXOL 350 MG/ML SOLN COMPARISON:  X-ray 12/16/2022, CT 11/29/2022 FINDINGS: Cardiovascular: Satisfactory opacification of the pulmonary arteries to the segmental level. No evidence of pulmonary embolism. Thoracic aorta is nonaneurysmal. Atherosclerotic calcifications of the aorta and coronary arteries. Normal heart size. No pericardial effusion. Mediastinum/Nodes: Redemonstration of a markedly enlarged, heterogeneous thyroid gland which has been previously evaluated and biopsied. No enlarged axillary, mediastinal, or hilar lymph nodes. Trachea and esophagus demonstrate no significant abnormality. Lungs/Pleura: Progressive area of mass-like opacity with multiple fiduciary markers at the left upper lobe measuring approximately 5.0 x 3.5 x 4.0 cm, previously measured approximately 3.2 x 1.9 x 3.4 cm on 11/29/2022. Ground-glass opacities within the bilateral upper lobes. Background of emphysema. Trace bilateral pleural effusions. No pneumothorax. Upper Abdomen: No acute abnormality. Musculoskeletal: No chest wall abnormality. No acute or significant osseous findings. Review of the MIP images confirms the above findings.  IMPRESSION: 1. No evidence of pulmonary embolism. 2. Progressive area of mass-like opacity within the left upper lobe measuring approximately 5.0 x 3.5 x 4.0 cm, previously measured approximately 3.2 x 1.9 x 3.4 cm on 11/29/2022. This may represent evolving radiation fibrosis although recurrent/progressive malignancy is not excluded. 3. Ground-glass opacities within the bilateral upper lobes, which may represent atypical infection or edema. 4. Trace bilateral pleural effusions. Aortic Atherosclerosis (ICD10-I70.0) and Emphysema (ICD10-J43.9). Electronically Signed   By: Duanne Guess D.O.   On: 12/16/2022 09:49   DG Chest Port 1 View  Result Date: 12/16/2022 CLINICAL DATA:  Shortness of breath. History of non-small cell lung cancer EXAM: PORTABLE CHEST 1 VIEW COMPARISON:  12/11/2022 FINDINGS: Normal heart size. Aortic atherosclerotic calcifications. No pleural effusion or interstitial edema. Post treatment changes associated with the known left upper lobe lung cancer with central fiducial markers are again noted. Compared with the previous exam there is been interval development of bilateral upper lobe hazy lung opacities which have a reticular interstitial appearance. The lower lung zones appear clear. IMPRESSION: Interval development of bilateral upper lobe hazy lung opacities which have a reticular interstitial appearance. Findings are nonspecific and could reflect atypical infection/inflammation. Chemotherapy induced pneumonitis not excluded. Electronically Signed   By: Signa Kell M.D.   On: 12/16/2022 06:55   DG Chest Port 1 View  Result Date: 12/11/2022 CLINICAL DATA:  88 year old female with chest pain, recent MI. History of lung cancer EXAM: PORTABLE CHEST 1 VIEW COMPARISON:  Portable chest 12/01/2022 and earlier. FINDINGS: Portable AP upright view at 0758 hours. Chronic left apical lung lesion with surgical clips and/or markers (see chest CT 11/29/2022). Stable lung volumes and mediastinal  contours. No pneumothorax, pulmonary edema, pleural effusion, or new pulmonary opacity. Stable visualized osseous structures. Negative visible bowel gas. Stable cholecystectomy clips. IMPRESSION: 1. Known left lung lesion, re-staged by CT on 11/29/2022 (please see that report). 2. No new cardiopulmonary abnormality. Electronically Signed   By: Odessa Fleming M.D.   On: 12/11/2022 08:20   CT CHEST WO CONTRAST  Result Date: 12/04/2022 CLINICAL DATA:  Restaging non-small cell lung cancer. * Tracking Code: BO * EXAM: CT CHEST WITHOUT CONTRAST TECHNIQUE: Multidetector CT imaging of the chest was performed following the standard protocol without IV contrast. RADIATION DOSE REDUCTION: This exam was performed according to the departmental dose-optimization program which includes automated exposure control, adjustment of the mA and/or kV according to patient size and/or use of iterative reconstruction technique. COMPARISON:  11/15/2022 FINDINGS: Cardiovascular: Heart size appears within normal limits. No pericardial effusion. Aortic atherosclerosis and coronary artery calcifications. Mediastinum/Nodes: Enlarged scratch set heterogeneous and enlarged thyroid gland is again noted. This has been evaluated on previous imaging. (ref: J Am Coll Radiol. 2015 Feb;12(2): 143-50). Trachea and esophagus are unremarkable. No enlarged mediastinal or axillary lymph nodes. Previously referenced AP window lymph node measures 7 mm on today's study, image 62/2. Formally 0.9 cm. Lungs/Pleura: Emphysema. No pleural effusion, airspace consolidation, atelectasis or pneumothorax. Progressive masslike architectural distortion, fibrosis and volume loss is identified within the left upper lobe, which is in compatible with changes secondary to external beam radiation, image 38/4. The underlying treated tumor is indistinguishable from these changes. There are several new non solid nodules within the right upper lobe which are favored to be  inflammatory/infectious in etiology. The largest of these measures 1.1 cm, image 70/4. Upper Abdomen: No acute findings. Extensive scratch set aortic atherosclerosis. No acute abnormality. Musculoskeletal: No chest wall mass or suspicious bone lesions identified. IMPRESSION: 1. Progressive masslike architectural distortion, fibrosis and volume loss is identified within the left upper lobe, which is in compatible with changes secondary to external beam radiation. The underlying treated tumor is indistinguishable from these changes. 2. There are several new non solid nodules within the right upper lobe which are favored to be inflammatory in etiology. The largest of these measures 1.1 cm. Attention in these areas on follow-up imaging is recommended. 3. Coronary artery calcifications noted. 4.  Aortic Atherosclerosis (ICD10-I70.0). Electronically Signed   By: Signa Kell M.D.   On: 12/04/2022 07:34   DG Chest Portable 1 View  Result Date: 12/01/2022 CLINICAL DATA:  Chest pain and tightness EXAM: PORTABLE CHEST 1 VIEW COMPARISON:  11/18/2022 FINDINGS: Left apical opacity with superimposed fiducial markers, stable. There is no edema, consolidation, effusion, or pneumothorax. Normal heart size and mediastinal contours. IMPRESSION: 1. Left apical opacity with superimposed fiducial markers. Reference recent chest CT. 2. No acute superimposed finding. Electronically Signed   By: Tiburcio Pea M.D.   On: 12/01/2022 05:09    Microbiology: Results for orders placed or performed during the hospital encounter of 11/15/22  Resp panel by RT-PCR (RSV, Flu A&B, Covid) Anterior Nasal Swab     Status: None   Collection Time: 11/15/22  8:12 AM   Specimen: Anterior Nasal Swab  Result Value Ref Range Status   SARS Coronavirus 2 by RT PCR NEGATIVE NEGATIVE Final    Comment: (NOTE) SARS-CoV-2 target nucleic acids are NOT DETECTED.  The SARS-CoV-2 RNA is generally detectable in upper respiratory specimens during the  acute phase of infection. The lowest concentration of SARS-CoV-2 viral copies this assay can detect is 138 copies/mL. A negative  result does not preclude SARS-Cov-2 infection and should not be used as the sole basis for treatment or other patient management decisions. A negative result may occur with  improper specimen collection/handling, submission of specimen other than nasopharyngeal swab, presence of viral mutation(s) within the areas targeted by this assay, and inadequate number of viral copies(<138 copies/mL). A negative result must be combined with clinical observations, patient history, and epidemiological information. The expected result is Negative.  Fact Sheet for Patients:  BloggerCourse.com  Fact Sheet for Healthcare Providers:  SeriousBroker.it  This test is no t yet approved or cleared by the [place] FDA and  has been authorized for detection and/or diagnosis of SARS-CoV-2 by FDA under an Emergency Use Authorization (EUA). This EUA will remain  in effect (meaning this test can be used) for the duration of the COVID-19 declaration under Section 564(b)(1) of the Act, 21 U.S.C.section 360bbb-3(b)(1), unless the authorization is terminated  or revoked sooner.       Influenza A by PCR NEGATIVE NEGATIVE Final   Influenza B by PCR NEGATIVE NEGATIVE Final    Comment: (NOTE) The Xpert Xpress SARS-CoV-2/FLU/RSV plus assay is intended as an aid in the diagnosis of influenza from Nasopharyngeal swab specimens and should not be used as a sole basis for treatment. Nasal washings and aspirates are unacceptable for Xpert Xpress SARS-CoV-2/FLU/RSV testing.  Fact Sheet for Patients: BloggerCourse.com  Fact Sheet for Healthcare Providers: SeriousBroker.it  This test is not yet approved or cleared by the [place] FDA and has been authorized for detection and/or  diagnosis of SARS-CoV-2 by FDA under an Emergency Use Authorization (EUA). This EUA will remain in effect (meaning this test can be used) for the duration of the COVID-19 declaration under Section 564(b)(1) of the Act, 21 U.S.C. section 360bbb-3(b)(1), unless the authorization is terminated or revoked.     Resp Syncytial Virus by PCR NEGATIVE NEGATIVE Final    Comment: (NOTE) Fact Sheet for Patients: BloggerCourse.com  Fact Sheet for Healthcare Providers: SeriousBroker.it  This test is not yet approved or cleared by the [place] FDA and has been authorized for detection and/or diagnosis of SARS-CoV-2 by FDA under an Emergency Use Authorization (EUA). This EUA will remain in effect (meaning this test can be used) for the duration of the COVID-19 declaration under Section 564(b)(1) of the Act, 21 U.S.C. section 360bbb-3(b)(1), unless the authorization is terminated or revoked.  Performed at [facility], [address], [place], [place] [address]   MRSA Next Gen by PCR, Nasal     Status: None   Collection Time: 11/15/22  5:57 PM   Specimen: Nasal Mucosa; Nasal Swab  Result Value Ref Range Status   MRSA by PCR Next Gen NOT DETECTED NOT DETECTED Final    Comment: (NOTE) The GeneXpert MRSA Assay (FDA approved for NASAL specimens only), is one component of a comprehensive MRSA colonization surveillance program. It is not intended to diagnose MRSA infection nor to guide or monitor treatment for MRSA infections. Test performance is not FDA approved in patients less than 16 years old. Performed at [facility], [address], [place], [place] [address]     Labs: CBC: Recent Labs  Lab 12/16/22 [phone] 12/16/22 [phone] 12/17/22 0446 12/17/22 0741 12/17/22 1811 12/18/22 0451 12/19/22 0437  WBC 6.2  --  4.6 5.0 5.6 4.7 3.7*  NEUTROABS 3.0  --   --   --   --   --   --   HGB 8.2*   < > 6.5*  6.3* 8.5* 8.5* 8.5*  HCT 26.3*   < >  20.5* 19.5* 25.9* 26.7* 27.2*  MCV 99.2  --  97.2 96.1 92.2 94.0 93.5  PLT 215  --  173 162 158 160 160   < > = values in this interval not displayed.   Basic Metabolic Panel: Recent Labs  Lab 12/16/22 0628 12/16/22 0632 12/17/22 0446  NA 137 142 134*  K 4.1 4.3 4.5  CL 105 107 104  CO2 25  --  23  GLUCOSE 133* 167* 121*  BUN 44* 49* 56*  CREATININE 1.47* 1.40* 1.47*  CALCIUM 8.8*  --  8.5*  MG  --   --  2.1  PHOS  --   --  4.4   Liver Function Tests: Recent Labs  Lab 12/16/22 0628  AST 40  ALT 29  ALKPHOS 65  BILITOT 0.3  PROT 9.0*  ALBUMIN 3.1*   CBG: No results for input(s): "GLUCAP" in the last 168 hours.  Discharge time spent: greater than 30 minutes.  Signed: Vassie Loll, MD [facility] 12/19/2022

## 2022-12-19 NOTE — Telephone Encounter (Signed)
Prescription Request  12/19/2022  LOV: 12/12/2022  What is the name of the medication or equipment? nitroGLYCERIN (NITROSTAT) 0.4 MG SL tablet   Have you contacted your pharmacy to request a refill? No   Which pharmacy would you like this sent to?   [facility] [place] [place] - [address] [place] [address] Phone: [phone] Fax: [phone]    Patient notified that their request is being sent to the clinical staff for review and that they should receive a response within 2 business days.   Please advise at [facility] [phone]

## 2022-12-19 NOTE — TOC Transition Note (Addendum)
Transition of Care [facility]) - CM/SW Discharge Note   Patient Details  Name: Lisa [name] MRN: [id number] Date of Birth: 28-Oct-1934  [facility]) CM/SW Contact:  Annice Needy, LCSW Phone Number: 12/19/2022, 4:12 PM   Clinical Narrative:    Niece, [name], request hospital bed, [facility] services and Palliative services.  Referrals made for services on 7/8 by social worker Evette Georges.    Final next level of care: Home/Self Care Barriers to Discharge: Continued Medical Work up   Patient Goals and CMS Choice      Discharge Placement                         Discharge Plan and Services Additional resources added to the After Visit Summary for   In-house Referral: Clinical Social Work Discharge Planning Services: Other - See comment (Family interested in palliative care with Dr. Durwin Nora PCP involved in care.)            DME Arranged: Hospital bed DME Agency: [employer] Date DME Agency Contacted: 12/19/22 Time DME Agency Contacted: 1606 Representative spoke with at DME Agency: Marthann Schiller HH Arranged: RN, Nurse's Aide, Social Work [employer] Agency: [facility] Date HH Agency Contacted: 12/19/22 Time HH Agency Contacted: 1610 Representative spoke with at [facility] Agency: Ephriam Knuckles  Social Determinants of Health (SDOH) Interventions SDOH Screenings   Food Insecurity: Patient Declined (12/19/2022)  Housing: Low Risk  (12/19/2022)  Transportation Needs: No Transportation Needs (12/19/2022)  Utilities: Not At Risk (12/19/2022)  Alcohol Screen: Low Risk  (10/24/2022)  Depression (PHQ2-9): Low Risk  (12/12/2022)  Financial Resource Strain: Low Risk  (10/24/2022)  Physical Activity: Inactive (10/24/2022)  Social Connections: Moderately Isolated (12/01/2022)  Stress: No Stress Concern Present (12/01/2022)  Tobacco Use: Medium Risk (12/16/2022)     Readmission Risk Interventions    12/17/2022    3:16 PM 11/16/2022    1:13 PM 09/06/2021    9:23 AM  Readmission Risk Prevention Plan   Transportation Screening  Complete Complete  PCP or Specialist Appt within 3-5 Days   Complete  HRI or Home Care Consult  Complete Complete  Social Work Consult for Recovery Care Planning/Counseling  Complete Complete  Palliative Care Screening  Complete Not Applicable  Medication Review Oceanographer) Complete Complete Complete  PCP or Specialist appointment within 3-5 days of discharge Complete

## 2022-12-19 NOTE — Telephone Encounter (Signed)
Not able to refill due to pt being in the hospital. Will refill once discharged.

## 2022-12-19 NOTE — Progress Notes (Signed)
Patient requires frequent re-positioning of the body in ways that cannot be achieved with an ordinary bed or wedge pillow to eliminate pain and the head of the bed needs to be evaluated more than 30 degrees most of the time.    Levester Waldridge D, LCSW  

## 2022-12-19 NOTE — Progress Notes (Signed)
Pulse oximetry on room air is 94.  Pulse oximetry on room air while ambulating is 92%.

## 2022-12-20 ENCOUNTER — Ambulatory Visit: Payer: Self-pay | Admitting: *Deleted

## 2022-12-20 ENCOUNTER — Inpatient Hospital Stay: Payer: Medicare Other

## 2022-12-20 ENCOUNTER — Telehealth: Payer: Self-pay | Admitting: Internal Medicine

## 2022-12-20 NOTE — Patient Outreach (Signed)
  Care Coordination   Follow Up Visit Note   01/04/2023 late entry for 12/20/22  Name: Lisa [name] MRN: [id number] DOB: [birth year]  Lisa [name] is a 88 y.o. year old female who sees Lisa [name], Lisa Mellow, MD for primary care. I spoke with Lisa [name], Lisa [name] by phone today.  What matters to the patients health and wellness today?  Returned to hospital on 12/16/22 with NSTEMI  Palliative care request made  A hospital staff had a conversation with patient about palliative care   DME company, home health services, private  Voiced interest in a family conference with all team members   Not preferring [facility]   She sleeps in recliner, has been doing this for about a year Did not prefer to invest in an adjustable bed  Has a wheelchair but initially did not want it  Continues to be active with [facility]  Needs assist to walk to bathroom Tremors so bad that can hold a plate utensil  Patient having difficulty with acknowledging the chronic changes in her health/care  Encouraged counseling and palliative care interactions     Goals Addressed             This Visit's Progress    [facility] care coordination services (MI, anemia)   Not on track    Interventions Today    Flowsheet Row Most Recent Value  Chronic Disease   Chronic disease during today's visit Other  [Family conference? Palliative care]  General Interventions   General Interventions Discussed/Reviewed General Interventions Reviewed, Durable Medical Equipment (DME), Community Resources, Doctor Visits, Communication with  Doctor Visits Discussed/Reviewed Doctor Visits Reviewed, PCP  Durable Medical Equipment (DME) Other, Oxygen, Walker  PCP/Specialist Visits Compliance with follow-up visit  Communication with Social Work  Exercise Interventions   Exercise Discussed/Reviewed Exercise Discussed, Physical Activity  Physical Activity Discussed/Reviewed Physical Activity Discussed, Types of exercise  [minimal per niece]   Education Interventions   Education Provided Provided Education  [place] care, goals of care, coping with aging/chroinc medical concerns]  Provided Verbal Education On Mental Health/Coping with Illness, Programmer, applications  Mental Health Interventions   Mental Health Discussed/Reviewed Mental Health Reviewed, Coping Strategies, Other  [coping with chronic medical diagnoses,]  Nutrition Interventions   Nutrition Discussed/Reviewed Nutrition Discussed, Decreasing salt, Adding fruits and vegetables, Fluid intake, Decreasing sugar intake  Pharmacy Interventions   Pharmacy Dicussed/Reviewed Pharmacy Topics Reviewed, Affording Medications  Safety Interventions   Safety Discussed/Reviewed Safety Reviewed, Fall Risk  Advanced Directive Interventions   Advanced Directives Discussed/Reviewed Advanced Directives Discussed, Advanced Care Planning, End of Life, Guardianship  End of Life Palliative  Guardianship Refer to an agency              SDOH assessments and interventions completed:  No     Care Coordination Interventions:  Yes, provided   Follow up plan: Follow up call scheduled for 01/08/23    Encounter Outcome:  Pt. Visit Completed   Faheem Ziemann L. Noelle Penner, RN, BSN, CCM [facility] Care Management Community Coordinator Office number [phone]
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
## 2022-12-20 NOTE — Telephone Encounter (Signed)
Christy called from [employer] [phone]. Patient discharge from hospital and following with Palliative  care services, if Dr Durwin Nora has any questions or concerns can contact Christy at [phone].

## 2022-12-21 ENCOUNTER — Telehealth: Payer: Self-pay | Admitting: Internal Medicine

## 2022-12-21 ENCOUNTER — Other Ambulatory Visit: Payer: Self-pay

## 2022-12-21 ENCOUNTER — Telehealth: Payer: Self-pay

## 2022-12-21 MED ORDER — COMBIVENT RESPIMAT 20-100 MCG/ACT IN AERS: 1.0000 | INHALATION_SPRAY | Freq: Four times a day (QID) | RESPIRATORY_TRACT | 2 refills | Status: DC | PRN

## 2022-12-21 MED ORDER — ROSUVASTATIN CALCIUM 20 MG PO TABS: 20.0000 mg | ORAL_TABLET | Freq: Every day | ORAL | 2 refills | Status: DC

## 2022-12-21 MED ORDER — NITROGLYCERIN 0.4 MG SL SUBL: 0.4000 mg | SUBLINGUAL_TABLET | SUBLINGUAL | 1 refills | Status: DC | PRN

## 2022-12-21 NOTE — Telephone Encounter (Signed)
Patient niece  called needs TOC was d/c 07.09.2024 call also refill on nitrogylcerin. Pharmacy [facility]

## 2022-12-21 NOTE — Telephone Encounter (Signed)
TOC call completed. Meds refilled.

## 2022-12-21 NOTE — Telephone Encounter (Signed)
Transition Care Management Follow-up Telephone Call Date of discharge and from where: 12/19/22 from [facility] How have you been since you were released from the hospital? Doing better Any questions or concerns? No  Items Reviewed: Did the pt receive and understand the discharge instructions provided? Yes  Medications obtained and verified? Yes  Other? No  Any new allergies since your discharge? No  Dietary orders reviewed? Yes Do you have support at home? Yes   Home Care and Equipment/Supplies: Were home health services ordered? yes If so, what is the name of the agency? Not sure  Has the agency set up a time to come to the patient's home? no Were any new equipment or medical supplies ordered?  No What is the name of the medical supply agency? NA Were you able to get the supplies/equipment? not applicable Do you have any questions related to the use of the equipment or supplies? No  Functional Questionnaire: (I = Independent and D = Dependent) ADLs: D  Bathing/Dressing- D  Meal Prep- D  Eating- D  Maintaining continence- D  Transferring/Ambulation- D  Managing Meds- D  Follow up appointments reviewed:  PCP Hospital f/u appt confirmed? Yes  Scheduled to see Barbaraann Faster on 01/01/2023 @ 10:20am. Specialist [facility] f/u appt confirmed? No  Scheduled to see N/A on N/A @ N/A. Are transportation arrangements needed? No  If their condition worsens, is the pt aware to call PCP or go to the Emergency Dept.? Yes Was the patient provided with contact information for the PCP's office or ED? Yes Was to pt encouraged to call back with questions or concerns? Yes

## 2022-12-25 ENCOUNTER — Encounter: Payer: Self-pay | Admitting: *Deleted

## 2022-12-25 ENCOUNTER — Ambulatory Visit: Payer: Self-pay | Admitting: *Deleted
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
## 2022-12-26 ENCOUNTER — Other Ambulatory Visit: Payer: Self-pay

## 2022-12-27 ENCOUNTER — Inpatient Hospital Stay: Payer: Medicare Other | Admitting: Oncology

## 2022-12-27 ENCOUNTER — Inpatient Hospital Stay: Payer: Medicare Other

## 2022-12-27 ENCOUNTER — Other Ambulatory Visit: Payer: Self-pay

## 2022-12-27 VITALS — BP 192/70 | HR 64 | Temp 98.7°F | Resp 16

## 2022-12-27 DIAGNOSIS — D472 Monoclonal gammopathy: Secondary | ICD-10-CM | POA: Diagnosis not present

## 2022-12-27 DIAGNOSIS — N184 Chronic kidney disease, stage 4 (severe): Secondary | ICD-10-CM | POA: Diagnosis not present

## 2022-12-27 DIAGNOSIS — I129 Hypertensive chronic kidney disease with stage 1 through stage 4 chronic kidney disease, or unspecified chronic kidney disease: Secondary | ICD-10-CM | POA: Diagnosis not present

## 2022-12-27 DIAGNOSIS — Z85118 Personal history of other malignant neoplasm of bronchus and lung: Secondary | ICD-10-CM | POA: Diagnosis not present

## 2022-12-27 DIAGNOSIS — D509 Iron deficiency anemia, unspecified: Secondary | ICD-10-CM

## 2022-12-27 DIAGNOSIS — D631 Anemia in chronic kidney disease: Secondary | ICD-10-CM

## 2022-12-27 DIAGNOSIS — Z7902 Long term (current) use of antithrombotics/antiplatelets: Secondary | ICD-10-CM | POA: Diagnosis not present

## 2022-12-27 DIAGNOSIS — Z79899 Other long term (current) drug therapy: Secondary | ICD-10-CM | POA: Diagnosis not present

## 2022-12-27 DIAGNOSIS — Z87891 Personal history of nicotine dependence: Secondary | ICD-10-CM | POA: Diagnosis not present

## 2022-12-27 DIAGNOSIS — Z7982 Long term (current) use of aspirin: Secondary | ICD-10-CM | POA: Diagnosis not present

## 2022-12-27 DIAGNOSIS — N189 Chronic kidney disease, unspecified: Secondary | ICD-10-CM | POA: Diagnosis not present

## 2022-12-27 LAB — CBC WITH DIFFERENTIAL/PLATELET
Abs Immature Granulocytes: 0.04 10*3/uL (ref 0.00–0.07)
Basophils Absolute: 0 10*3/uL (ref 0.0–0.1)
Basophils Relative: 0 %
Eosinophils Absolute: 0 10*3/uL (ref 0.0–0.5)
Eosinophils Relative: 0 %
HCT: 24.6 % — ABNORMAL LOW (ref 36.0–46.0)
Hemoglobin: 7.7 g/dL — ABNORMAL LOW (ref 12.0–15.0)
Immature Granulocytes: 1 %
Lymphocytes Relative: 9 %
Lymphs Abs: 0.7 10*3/uL (ref 0.7–4.0)
MCH: 29.8 pg (ref 26.0–34.0)
MCHC: 31.3 g/dL (ref 30.0–36.0)
MCV: 95.3 fL (ref 80.0–100.0)
Monocytes Absolute: 0.6 10*3/uL (ref 0.1–1.0)
Monocytes Relative: 7 %
Neutro Abs: 7 10*3/uL (ref 1.7–7.7)
Neutrophils Relative %: 83 %
Platelets: 102 10*3/uL — ABNORMAL LOW (ref 150–400)
RBC: 2.58 MIL/uL — ABNORMAL LOW (ref 3.87–5.11)
RDW: 16.8 % — ABNORMAL HIGH (ref 11.5–15.5)
WBC: 8.4 10*3/uL (ref 4.0–10.5)
nRBC: 0 % (ref 0.0–0.2)

## 2022-12-27 LAB — SAMPLE TO BLOOD BANK

## 2022-12-27 NOTE — Progress Notes (Signed)
[facility] [address], [place] [address]   CLINIC:  Medical Oncology/Hematology  PCP:  [name], MD [address] / [place] [address] [phone]   REASON FOR VISIT:  Follow-up for MGUS and left lung cancer  PRIOR THERAPY: none  CURRENT THERAPY: suveillance  BRIEF ONCOLOGIC HISTORY:  Oncology History   No history exists.    CANCER STAGING:  Cancer Staging  Adenocarcinoma of left lung [facility]) Staging form: Lung, AJCC 8th Edition - Clinical stage from 09/11/2021: cT1, cN0, cM0 - Unsigned   INTERVAL HISTORY:  [name], a 88 y.o. female, seen for follow-up of MGUS, anemia.   Since her last visit, she was hospitalized for chest pain secondary to NSTEMI.  Unfortunately, due to age and comorbidities she is not a candidate for cardiac cath or amendable candidate for dialysis with cardiac cath procedure.  She completed medical management inpatient with use of beta-blocker, Imdur and 48 hours of heparin drip.  She was started on Ranexa which has helped her chest pain.  She follows up with cardiology.   She continues to get intermittent blood transfusions for anemia.  She was found not to be a candidate for erythropoietin stimulating agent due to uncontrolled hypertension.  She is currently coming in for biweekly labs and blood transfusions as needed.  Most recent blood transfusion was on 12/19/22.  Hemoglobin at discharge was 8.5.   Today, she reports she is feeling well.  Denies any pain.  Appetite and energy levels are 50%.  Has shortness of breath with exertion secondary to COPD.  Has intermittent constipation.  Overall feels pretty good.  Her blood pressure was found to be significantly elevated.  She has been taking her blood pressure medicine as prescribed.  Does have follow-up with her primary care doctor on Monday.  REVIEW OF SYSTEMS:  Review of Systems  Constitutional:  Positive for fatigue.  Respiratory:  Positive for  shortness of breath.   Gastrointestinal:  Positive for constipation.    PAST MEDICAL/SURGICAL HISTORY:  Past Medical History:  Diagnosis Date   Anemia    Carotid artery stenosis    CKD (chronic kidney disease)    COPD (chronic obstructive pulmonary disease) (HCC)    Dyspnea    Due to COPD per patient   Goiter diffuse, nontoxic    Per patient   Hepatitis C    History of radiation therapy    Left lung-10/10/21-10/17/21- Dr. Antony Blackbird   Hypertension    Monoclonal gammopathy of unknown significance (MGUS)    Pyelonephritis 04/06/2014   UTI   Past Surgical History:  Procedure Laterality Date   ABDOMINAL HYSTERECTOMY     CHOLECYSTECTOMY N/A 09/17/2018   Procedure: LAPAROSCOPIC CHOLECYSTECTOMY;  Surgeon: Lucretia Roers, MD;  Location: [facility] ORS;  Service: General;  Laterality: N/A;   FUDUCIAL PLACEMENT Left 09/01/2021   Procedure: PLACEMENT OF FIDUCIAL MARKERS TIMES FOUR;  Surgeon: Loreli Slot, MD;  Location: [facility] OR;  Service: Thoracic;  Laterality: Left;   TEE WITHOUT CARDIOVERSION N/A 04/10/2014   Procedure: TRANSESOPHAGEAL ECHOCARDIOGRAM (TEE);  Surgeon: Donato Schultz, MD;  Location: [facility] ENDOSCOPY;  Service: Cardiovascular;  Laterality: N/A;   VIDEO BRONCHOSCOPY WITH ENDOBRONCHIAL NAVIGATION N/A 09/01/2021   Procedure: VIDEO BRONCHOSCOPY WITH ENDOBRONCHIAL NAVIGATION;  Surgeon: Loreli Slot, MD;  Location: [facility] OR;  Service: Thoracic;  Laterality: N/A;    SOCIAL HISTORY:  Social History   Socioeconomic History   Marital status: Widowed  Spouse name: Not on file   Number of children: 0   Years of education: 29   Highest education level: 12th grade  Occupational History   Not on file  Tobacco Use   Smoking status: Former    Current packs/day: 0.25    Types: Cigarettes    Passive exposure: Current   Smokeless tobacco: Never   Tobacco comments:    Smoking Cessation Classes, Services, Agencies & Resources Offered.  Vaping Use   Vaping status: Never Used   Substance and Sexual Activity   Alcohol use: No   Drug use: No   Sexual activity: Not Currently  Other Topics Concern   Not on file  Social History Narrative   Not on file   Social Determinants of Health   Financial Resource Strain: Low Risk  (10/24/2022)   Overall Financial Resource Strain (CARDIA)    Difficulty of Paying Living Expenses: Not hard at all  Food Insecurity: Patient Declined (12/19/2022)   Hunger Vital Sign    Worried About Running Out of Food in the Last Year: Patient declined    Ran Out of Food in the Last Year: Patient declined  Transportation Needs: No Transportation Needs (12/19/2022)   PRAPARE - Administrator, Civil Service (Medical): No    Lack of Transportation (Non-Medical): No  Physical Activity: Inactive (10/24/2022)   Exercise Vital Sign    Days of Exercise per Week: 0 days    Minutes of Exercise per Session: 0 min  Stress: No Stress Concern Present (12/01/2022)   [employer] of Occupational Health - Occupational Stress Questionnaire    Feeling of Stress : Only a little  Social Connections: Moderately Isolated (12/01/2022)   Social Connection and Isolation Panel [NHANES]    Frequency of Communication with Friends and Family: More than three times a week    Frequency of Social Gatherings with Friends and Family: More than three times a week    Attends Religious Services: More than 4 times per year    Active Member of [employer] or Organizations: No    Attends Banker Meetings: Never    Marital Status: Widowed  Intimate Partner Violence: Not At Risk (12/19/2022)   Humiliation, Afraid, Rape, and Kick questionnaire    Fear of Current or Ex-Partner: No    Emotionally Abused: No    Physically Abused: No    Sexually Abused: No    FAMILY HISTORY:  Family History  Problem Relation Age of Onset   Hypertension Mother    Thyroid disease Mother    Hypertension Father    Thyroid disease Sister    Thyroid disease Brother    Thyroid  disease Sister     CURRENT MEDICATIONS:  Current Outpatient Medications  Medication Sig Dispense Refill   aspirin 81 MG EC tablet Take 81 mg by mouth daily.     Calcium Carbonate Antacid (TUMS PO) Take 1 tablet by mouth daily.     Cholecalciferol (VITAMIN D3) 125 MCG (5000 UT) CAPS Take 5,000 Units by mouth daily.     clopidogrel (PLAVIX) 75 MG tablet Take 1 tablet (75 mg total) by mouth daily. 30 tablet 2   feeding supplement (ENSURE ENLIVE / ENSURE PLUS) LIQD Take 237 mLs by mouth 2 (two) times daily between meals. 237 mL 12   furosemide (LASIX) 20 MG tablet Take 1 tablet (20 mg total) by mouth daily. 90 tablet 3   Ipratropium-Albuterol (COMBIVENT RESPIMAT) 20-100 MCG/ACT AERS respimat Inhale 1 puff into the lungs  every 6 (six) hours as needed for wheezing or shortness of breath. 4 g 2   ipratropium-albuterol (DUONEB) 0.5-2.5 (3) MG/3ML SOLN Take 3 mLs by nebulization every 4 (four) hours as needed (wheezing/shortness of breath).     Iron, Ferrous Sulfate, 325 (65 Fe) MG TABS Take 325 mg by mouth daily.     isosorbide mononitrate (IMDUR) 60 MG 24 hr tablet Take 1.5 tablets (90 mg total) by mouth daily. 90 tablet 2   metoprolol tartrate (LOPRESSOR) 25 MG tablet Take 1 tablet (25 mg total) by mouth 2 (two) times daily. 60 tablet 2   Multiple Vitamin (MULTIVITAMIN WITH MINERALS) TABS tablet Take 1 tablet by mouth daily.     ranolazine (RANEXA) 500 MG 12 hr tablet Take 1 tablet (500 mg total) by mouth 2 (two) times daily. 60 tablet 1   rosuvastatin (CRESTOR) 20 MG tablet Take 1 tablet (20 mg total) by mouth daily at 6 PM. 90 tablet 2   nitroGLYCERIN (NITROSTAT) 0.4 MG SL tablet Place 1 tablet (0.4 mg total) under the tongue every 5 (five) minutes as needed for chest pain. (Patient not taking: Reported on 12/27/2022) 30 tablet 1   No current facility-administered medications for this visit.   Facility-Administered Medications Ordered in Other Visits  Medication Dose Route Frequency Provider Last  Rate Last Admin   epoetin alfa-epbx (RETACRIT) injection 10,000 Units  10,000 Units Subcutaneous Once Durenda Hurt E, NP        ALLERGIES:  No Known Allergies  PHYSICAL EXAM:  Performance status (ECOG): 2 - Symptomatic, <50% confined to bed  Vitals:   12/27/22 1404 12/27/22 1409  BP: (!) 193/61 (!) 192/70  Pulse: 64   Resp: 16   Temp: 98.7 F (37.1 C)   SpO2: 100%     Wt Readings from Last 3 Encounters:  12/19/22 112 lb 7 oz (51 kg)  12/12/22 101 lb 12.8 oz (46.2 kg)  12/11/22 101 lb (45.8 kg)   Physical Exam Constitutional:      Appearance: Normal appearance.  HENT:     Head: Normocephalic and atraumatic.  Eyes:     Pupils: Pupils are equal, round, and reactive to light.  Cardiovascular:     Rate and Rhythm: Normal rate and regular rhythm.     Heart sounds: Normal heart sounds. No murmur heard. Pulmonary:     Effort: Pulmonary effort is normal.     Breath sounds: Normal breath sounds. No wheezing.  Abdominal:     General: Bowel sounds are normal. There is no distension.     Palpations: Abdomen is soft.     Tenderness: There is no abdominal tenderness.  Musculoskeletal:        General: Normal range of motion.     Cervical back: Normal range of motion.  Skin:    General: Skin is warm and dry.     Findings: No rash.  Neurological:     Mental Status: She is alert and oriented to person, place, and time.  Psychiatric:        Judgment: Judgment normal.      LABORATORY DATA:  I have reviewed the labs as listed.     Latest Ref Rng & Units 12/27/2022   12:28 PM 12/19/2022    4:37 AM 12/18/2022    4:51 AM  CBC  WBC 4.0 - 10.5 K/uL 8.4  3.7  4.7   Hemoglobin 12.0 - 15.0 g/dL 7.7  8.5  8.5   Hematocrit 36.0 - 46.0 % 24.6  27.2  26.7   Platelets 150 - 400 K/uL 102  160  160       Latest Ref Rng & Units 12/17/2022    4:46 AM 12/16/2022    6:32 AM 12/16/2022    6:28 AM  CMP  Glucose 70 - 99 mg/dL 660  630  160   BUN 8 - 23 mg/dL 56  49  44   Creatinine 0.44 -  1.00 mg/dL 1.09  3.23  5.57   Sodium 135 - 145 mmol/L 134  142  137   Potassium 3.5 - 5.1 mmol/L 4.5  4.3  4.1   Chloride 98 - 111 mmol/L 104  107  105   CO2 22 - 32 mmol/L 23   25   Calcium 8.9 - 10.3 mg/dL 8.5   8.8   Total Protein 6.5 - 8.1 g/dL   9.0   Total Bilirubin 0.3 - 1.2 mg/dL   0.3   Alkaline Phos 38 - 126 U/L   65   AST 15 - 41 U/L   40   ALT 0 - 44 U/L   29     DIAGNOSTIC IMAGING:  I have independently reviewed the scans and discussed with the patient. CT Angio Chest Pulmonary Embolism (PE) W or WO Contrast  Result Date: 12/16/2022 CLINICAL DATA:  Pulmonary embolism (PE) suspected, high prob. History of non-small cell lung cancer EXAM: CT ANGIOGRAPHY CHEST WITH CONTRAST TECHNIQUE: Multidetector CT imaging of the chest was performed using the standard protocol during bolus administration of intravenous contrast. Multiplanar CT image reconstructions and MIPs were obtained to evaluate the vascular anatomy. RADIATION DOSE REDUCTION: This exam was performed according to the departmental dose-optimization program which includes automated exposure control, adjustment of the mA and/or kV according to patient size and/or use of iterative reconstruction technique. CONTRAST:  60mL OMNIPAQUE IOHEXOL 350 MG/ML SOLN COMPARISON:  X-ray 12/16/2022, CT 11/29/2022 FINDINGS: Cardiovascular: Satisfactory opacification of the pulmonary arteries to the segmental level. No evidence of pulmonary embolism. Thoracic aorta is nonaneurysmal. Atherosclerotic calcifications of the aorta and coronary arteries. Normal heart size. No pericardial effusion. Mediastinum/Nodes: Redemonstration of a markedly enlarged, heterogeneous thyroid gland which has been previously evaluated and biopsied. No enlarged axillary, mediastinal, or hilar lymph nodes. Trachea and esophagus demonstrate no significant abnormality. Lungs/Pleura: Progressive area of mass-like opacity with multiple fiduciary markers at the left upper lobe  measuring approximately 5.0 x 3.5 x 4.0 cm, previously measured approximately 3.2 x 1.9 x 3.4 cm on 11/29/2022. Ground-glass opacities within the bilateral upper lobes. Background of emphysema. Trace bilateral pleural effusions. No pneumothorax. Upper Abdomen: No acute abnormality. Musculoskeletal: No chest wall abnormality. No acute or significant osseous findings. Review of the MIP images confirms the above findings. IMPRESSION: 1. No evidence of pulmonary embolism. 2. Progressive area of mass-like opacity within the left upper lobe measuring approximately 5.0 x 3.5 x 4.0 cm, previously measured approximately 3.2 x 1.9 x 3.4 cm on 11/29/2022. This may represent evolving radiation fibrosis although recurrent/progressive malignancy is not excluded. 3. Ground-glass opacities within the bilateral upper lobes, which may represent atypical infection or edema. 4. Trace bilateral pleural effusions. Aortic Atherosclerosis (ICD10-I70.0) and Emphysema (ICD10-J43.9). Electronically Signed   By: Duanne Guess D.O.   On: 12/16/2022 09:49   DG Chest Port 1 View  Result Date: 12/16/2022 CLINICAL DATA:  Shortness of breath. History of non-small cell lung cancer EXAM: PORTABLE CHEST 1 VIEW COMPARISON:  12/11/2022 FINDINGS: Normal heart size. Aortic atherosclerotic calcifications. No pleural effusion or interstitial  edema. Post treatment changes associated with the known left upper lobe lung cancer with central fiducial markers are again noted. Compared with the previous exam there is been interval development of bilateral upper lobe hazy lung opacities which have a reticular interstitial appearance. The lower lung zones appear clear. IMPRESSION: Interval development of bilateral upper lobe hazy lung opacities which have a reticular interstitial appearance. Findings are nonspecific and could reflect atypical infection/inflammation. Chemotherapy induced pneumonitis not excluded. Electronically Signed   By: Signa Kell M.D.    On: 12/16/2022 06:55   DG Chest Port 1 View  Result Date: 12/11/2022 CLINICAL DATA:  88 year old female with chest pain, recent MI. History of lung cancer EXAM: PORTABLE CHEST 1 VIEW COMPARISON:  Portable chest 12/01/2022 and earlier. FINDINGS: Portable AP upright view at 0758 hours. Chronic left apical lung lesion with surgical clips and/or markers (see chest CT 11/29/2022). Stable lung volumes and mediastinal contours. No pneumothorax, pulmonary edema, pleural effusion, or new pulmonary opacity. Stable visualized osseous structures. Negative visible bowel gas. Stable cholecystectomy clips. IMPRESSION: 1. Known left lung lesion, re-staged by CT on 11/29/2022 (please see that report). 2. No new cardiopulmonary abnormality. Electronically Signed   By: Odessa Fleming M.D.   On: 12/11/2022 08:20   CT CHEST WO CONTRAST  Result Date: 12/04/2022 CLINICAL DATA:  Restaging non-small cell lung cancer. * Tracking Code: BO * EXAM: CT CHEST WITHOUT CONTRAST TECHNIQUE: Multidetector CT imaging of the chest was performed following the standard protocol without IV contrast. RADIATION DOSE REDUCTION: This exam was performed according to the departmental dose-optimization program which includes automated exposure control, adjustment of the mA and/or kV according to patient size and/or use of iterative reconstruction technique. COMPARISON:  11/15/2022 FINDINGS: Cardiovascular: Heart size appears within normal limits. No pericardial effusion. Aortic atherosclerosis and coronary artery calcifications. Mediastinum/Nodes: Enlarged scratch set heterogeneous and enlarged thyroid gland is again noted. This has been evaluated on previous imaging. (ref: J Am Coll Radiol. 2015 Feb;12(2): 143-50). Trachea and esophagus are unremarkable. No enlarged mediastinal or axillary lymph nodes. Previously referenced AP window lymph node measures 7 mm on today's study, image 62/2. Formally 0.9 cm. Lungs/Pleura: Emphysema. No pleural effusion, airspace  consolidation, atelectasis or pneumothorax. Progressive masslike architectural distortion, fibrosis and volume loss is identified within the left upper lobe, which is in compatible with changes secondary to external beam radiation, image 38/4. The underlying treated tumor is indistinguishable from these changes. There are several new non solid nodules within the right upper lobe which are favored to be inflammatory/infectious in etiology. The largest of these measures 1.1 cm, image 70/4. Upper Abdomen: No acute findings. Extensive scratch set aortic atherosclerosis. No acute abnormality. Musculoskeletal: No chest wall mass or suspicious bone lesions identified. IMPRESSION: 1. Progressive masslike architectural distortion, fibrosis and volume loss is identified within the left upper lobe, which is in compatible with changes secondary to external beam radiation. The underlying treated tumor is indistinguishable from these changes. 2. There are several new non solid nodules within the right upper lobe which are favored to be inflammatory in etiology. The largest of these measures 1.1 cm. Attention in these areas on follow-up imaging is recommended. 3. Coronary artery calcifications noted. 4.  Aortic Atherosclerosis (ICD10-I70.0). Electronically Signed   By: Signa Kell M.D.   On: 12/04/2022 07:34   DG Chest Portable 1 View  Result Date: 12/01/2022 CLINICAL DATA:  Chest pain and tightness EXAM: PORTABLE CHEST 1 VIEW COMPARISON:  11/18/2022 FINDINGS: Left apical opacity with superimposed fiducial markers, stable.  There is no edema, consolidation, effusion, or pneumothorax. Normal heart size and mediastinal contours. IMPRESSION: 1. Left apical opacity with superimposed fiducial markers. Reference recent chest CT. 2. No acute superimposed finding. Electronically Signed   By: Tiburcio Pea M.D.   On: 12/01/2022 05:09     ASSESSMENT:  IgG kappa smoldering myeloma: - Patient seen at the request of Dr. Wolfgang Phoenix for  monoclonal gammopathy. - Labs on 05/12/2021 showed M spike 2.4 g.  Immunofixation shows IgG kappa monoclonal protein with a faint IgA lambda monoclonal protein. - Kappa light chains 143, lambda light chains 38, ratio of 3.72. - 24-hour urine total protein 84 mg.  Urine immunofixation shows faint IgG kappa. - No B symptoms or neuropathy. - BMBX on 07/14/2021: Pathology shows hypocellular marrow with clonal plasma cell population comprising 15-20% of the total marrow cellularity.  No overt dysplasia. - Cytogenetics: 45,X-X[9]/46,XX[11] - Myeloma FISH panel: Monosomy 13, duplication 1 q. - Skeletal survey on 06/21/2021: 1 cm circumscribed sclerotic lesion in the distal tibial shaft on the right, nonspecific.  No lytic bone lesion.   Social/family history: - She lives with her nephew.  She is seen with her brother today.  She uses walker to ambulate. 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 - Current active smoker, half pack per day for 62 years.  Stage I (T1 N0 M0) adenocarcinoma of the left upper lobe of the lung:       - PET scan on 08/04/2021: 1.7 x 1.4 cm left upper lobe nodule SUV 18.  No hypermetabolic mediastinal or hilar adenopathy.  No evidence of metastatic disease. - Navigational bronchoscopy and biopsy by Dr. Dorris Fetch on 09/01/2021.  Not a surgical candidate per Dr. Dorris Fetch. - Pathology consistent with moderately differentiated adenocarcinoma. - SBRT to the left upper lobe lung lesion, 54 Gray in 3 fractions completed in May 2023  4.  Thyroid nodules: - Reviewed biopsy of the LML nodule, consistent with benign follicular nodule Bethesda category 2. - Biopsy of the RML-Bethesda category 3, atypia of undetermined significance.  Afirma test was negative.  PLAN:  IgG kappa smoldering myeloma: - Labs are stable at this time. -Bone scan from 08/04/2021 did not reveal any lytic lesions. -Plan to check myeloma labs every 6 months (next in December 2024)  2.Stage I  (T1 N0 M0) adenocarcinoma of the left upper lobe of the lung: - CT chest from 11/29/2022 showed progressive masslike architectural distortion, fibrosis and volume loss identified within the left upper lobe which is incompatible with changes secondary to external beam radiation.  The underlying treated tumor is indistinguishable from these changes.  There are several new nonsolid nodules within the right upper lobe which are favored to be inflammatory in etiology.   - Repeat CT scan in 6 months (December 2024). .  3.  Normocytic anemia/pancytopenia: - Anemia from chronic inflammation and CKD. -Received PRBC on 12/18/2022 while hospitalized for chest pain. -Lab work from 12/27/22 shows a hemoglobin of 7.5 (8.5).  -She is feeling relatively well. -Discussed with patient and niece rechecking labs in 1 week..  Will hold off on blood transfusion until next week.  -Discussed if she starts feeling poorly, we can get her in sooner for blood transfusion. -Not a great candidate for ESA treatment secondary to uncontrolled hypertension.  Patient has opted for biweekly labs and blood transfusions as needed.  4.  Hypertension: -Blood pressure is significantly elevated today at 192/70. -Reports she has been taking blood pressure medicine as  prescribed. -Has follow-up with PCP on Monday.   PLAN SUMMARY: >> Repeat labs in 1 week (cbc and hold tube) for poss blood transfusions.    I spent 20 minutes dedicated to the care of this patient (face-to-face and non-face-to-face) on the date of the encounter to include what is described in the assessment and plan.   Orders placed this encounter:  Orders Placed This Encounter  Procedures   CBC with Differential/Platelet   Sample to Blood Bank(Blood Bank Hold)    I spent 30 minutes dedicated to the care of this patient (face-to-face and non-face-to-face) on the date of the encounter to include what is described in the assessment and plan.

## 2022-12-31 ENCOUNTER — Inpatient Hospital Stay (HOSPITAL_COMMUNITY)
Admission: EM | Admit: 2022-12-31 | Discharge: 2023-01-10 | DRG: 280 | Disposition: A | Discharge status: Skilled Nursing Facility | Payer: Medicare Other | Attending: Internal Medicine | Admitting: Internal Medicine

## 2022-12-31 ENCOUNTER — Encounter (HOSPITAL_COMMUNITY)
Admission: EM | Disposition: A | Discharge status: Skilled Nursing Facility | Payer: Self-pay | Source: Home / Self Care | Attending: Internal Medicine

## 2022-12-31 ENCOUNTER — Emergency Department (HOSPITAL_COMMUNITY): Payer: Medicare Other

## 2022-12-31 DIAGNOSIS — Z79899 Other long term (current) drug therapy: Secondary | ICD-10-CM | POA: Diagnosis not present

## 2022-12-31 DIAGNOSIS — J449 Chronic obstructive pulmonary disease, unspecified: Secondary | ICD-10-CM | POA: Diagnosis present

## 2022-12-31 DIAGNOSIS — R0603 Acute respiratory distress: Secondary | ICD-10-CM

## 2022-12-31 DIAGNOSIS — I272 Pulmonary hypertension, unspecified: Secondary | ICD-10-CM | POA: Diagnosis not present

## 2022-12-31 DIAGNOSIS — Z9049 Acquired absence of other specified parts of digestive tract: Secondary | ICD-10-CM

## 2022-12-31 DIAGNOSIS — Z7982 Long term (current) use of aspirin: Secondary | ICD-10-CM

## 2022-12-31 DIAGNOSIS — D472 Monoclonal gammopathy: Secondary | ICD-10-CM | POA: Diagnosis present

## 2022-12-31 DIAGNOSIS — Z66 Do not resuscitate: Secondary | ICD-10-CM | POA: Diagnosis present

## 2022-12-31 DIAGNOSIS — R9431 Abnormal electrocardiogram [ECG] [EKG]: Secondary | ICD-10-CM | POA: Diagnosis not present

## 2022-12-31 DIAGNOSIS — I2510 Atherosclerotic heart disease of native coronary artery without angina pectoris: Secondary | ICD-10-CM | POA: Diagnosis present

## 2022-12-31 DIAGNOSIS — I499 Cardiac arrhythmia, unspecified: Secondary | ICD-10-CM | POA: Diagnosis not present

## 2022-12-31 DIAGNOSIS — N179 Acute kidney failure, unspecified: Secondary | ICD-10-CM | POA: Diagnosis present

## 2022-12-31 DIAGNOSIS — I130 Hypertensive heart and chronic kidney disease with heart failure and stage 1 through stage 4 chronic kidney disease, or unspecified chronic kidney disease: Secondary | ICD-10-CM | POA: Diagnosis not present

## 2022-12-31 DIAGNOSIS — R278 Other lack of coordination: Secondary | ICD-10-CM | POA: Diagnosis not present

## 2022-12-31 DIAGNOSIS — I214 Non-ST elevation (NSTEMI) myocardial infarction: Secondary | ICD-10-CM | POA: Diagnosis not present

## 2022-12-31 DIAGNOSIS — D631 Anemia in chronic kidney disease: Secondary | ICD-10-CM | POA: Diagnosis not present

## 2022-12-31 DIAGNOSIS — J438 Other emphysema: Secondary | ICD-10-CM | POA: Diagnosis not present

## 2022-12-31 DIAGNOSIS — J9601 Acute respiratory failure with hypoxia: Secondary | ICD-10-CM | POA: Diagnosis not present

## 2022-12-31 DIAGNOSIS — I161 Hypertensive emergency: Secondary | ICD-10-CM | POA: Diagnosis not present

## 2022-12-31 DIAGNOSIS — Z923 Personal history of irradiation: Secondary | ICD-10-CM | POA: Diagnosis not present

## 2022-12-31 DIAGNOSIS — I5033 Acute on chronic diastolic (congestive) heart failure: Secondary | ICD-10-CM | POA: Diagnosis not present

## 2022-12-31 DIAGNOSIS — I252 Old myocardial infarction: Secondary | ICD-10-CM

## 2022-12-31 DIAGNOSIS — Z1152 Encounter for screening for COVID-19: Secondary | ICD-10-CM

## 2022-12-31 DIAGNOSIS — D696 Thrombocytopenia, unspecified: Secondary | ICD-10-CM | POA: Diagnosis not present

## 2022-12-31 DIAGNOSIS — Z7902 Long term (current) use of antithrombotics/antiplatelets: Secondary | ICD-10-CM

## 2022-12-31 DIAGNOSIS — I10 Essential (primary) hypertension: Secondary | ICD-10-CM | POA: Diagnosis not present

## 2022-12-31 DIAGNOSIS — Z7401 Bed confinement status: Secondary | ICD-10-CM | POA: Diagnosis not present

## 2022-12-31 DIAGNOSIS — E8721 Acute metabolic acidosis: Secondary | ICD-10-CM | POA: Diagnosis present

## 2022-12-31 DIAGNOSIS — J441 Chronic obstructive pulmonary disease with (acute) exacerbation: Secondary | ICD-10-CM | POA: Diagnosis not present

## 2022-12-31 DIAGNOSIS — Z741 Need for assistance with personal care: Secondary | ICD-10-CM | POA: Diagnosis not present

## 2022-12-31 DIAGNOSIS — Z87891 Personal history of nicotine dependence: Secondary | ICD-10-CM

## 2022-12-31 DIAGNOSIS — M6281 Muscle weakness (generalized): Secondary | ICD-10-CM | POA: Diagnosis not present

## 2022-12-31 DIAGNOSIS — J9690 Respiratory failure, unspecified, unspecified whether with hypoxia or hypercapnia: Secondary | ICD-10-CM | POA: Diagnosis not present

## 2022-12-31 DIAGNOSIS — R54 Age-related physical debility: Secondary | ICD-10-CM | POA: Diagnosis present

## 2022-12-31 DIAGNOSIS — R531 Weakness: Secondary | ICD-10-CM | POA: Diagnosis not present

## 2022-12-31 DIAGNOSIS — R079 Chest pain, unspecified: Secondary | ICD-10-CM | POA: Diagnosis not present

## 2022-12-31 DIAGNOSIS — D6489 Other specified anemias: Secondary | ICD-10-CM | POA: Diagnosis not present

## 2022-12-31 DIAGNOSIS — E8729 Other acidosis: Secondary | ICD-10-CM | POA: Diagnosis present

## 2022-12-31 DIAGNOSIS — Z8349 Family history of other endocrine, nutritional and metabolic diseases: Secondary | ICD-10-CM

## 2022-12-31 DIAGNOSIS — R6889 Other general symptoms and signs: Secondary | ICD-10-CM | POA: Diagnosis not present

## 2022-12-31 DIAGNOSIS — Z85118 Personal history of other malignant neoplasm of bronchus and lung: Secondary | ICD-10-CM

## 2022-12-31 DIAGNOSIS — J9602 Acute respiratory failure with hypercapnia: Secondary | ICD-10-CM | POA: Diagnosis present

## 2022-12-31 DIAGNOSIS — R0902 Hypoxemia: Secondary | ICD-10-CM | POA: Diagnosis not present

## 2022-12-31 DIAGNOSIS — N184 Chronic kidney disease, stage 4 (severe): Secondary | ICD-10-CM | POA: Diagnosis not present

## 2022-12-31 DIAGNOSIS — I213 ST elevation (STEMI) myocardial infarction of unspecified site: Secondary | ICD-10-CM | POA: Diagnosis not present

## 2022-12-31 DIAGNOSIS — Z743 Need for continuous supervision: Secondary | ICD-10-CM | POA: Diagnosis not present

## 2022-12-31 DIAGNOSIS — Z5329 Procedure and treatment not carried out because of patient's decision for other reasons: Secondary | ICD-10-CM | POA: Diagnosis present

## 2022-12-31 DIAGNOSIS — R0602 Shortness of breath: Secondary | ICD-10-CM | POA: Diagnosis not present

## 2022-12-31 DIAGNOSIS — Z8249 Family history of ischemic heart disease and other diseases of the circulatory system: Secondary | ICD-10-CM

## 2022-12-31 LAB — I-STAT VENOUS BLOOD GAS, ED
Acid-base deficit: 8 mmol/L — ABNORMAL HIGH (ref 0.0–2.0)
Acid-base deficit: 3 mmol/L — ABNORMAL HIGH (ref 0.0–2.0)
Bicarbonate: 21.4 mmol/L (ref 20.0–28.0)
Bicarbonate: 24 mmol/L (ref 20.0–28.0)
Calcium, Ion: 1.16 mmol/L (ref 1.15–1.40)
Calcium, Ion: 1.18 mmol/L (ref 1.15–1.40)
HCT: 23 % — ABNORMAL LOW (ref 36.0–46.0)
HCT: 22 % — ABNORMAL LOW (ref 36.0–46.0)
Hemoglobin: 7.8 g/dL — ABNORMAL LOW (ref 12.0–15.0)
Hemoglobin: 7.5 g/dL — ABNORMAL LOW (ref 12.0–15.0)
O2 Saturation: 48 %
O2 Saturation: 70 %
Potassium: 4.4 mmol/L (ref 3.5–5.1)
Potassium: 4.6 mmol/L (ref 3.5–5.1)
Sodium: 142 mmol/L (ref 135–145)
Sodium: 139 mmol/L (ref 135–145)
TCO2: 23 mmol/L (ref 22–32)
TCO2: 26 mmol/L (ref 22–32)
pCO2, Ven: 64.3 mmHg — ABNORMAL HIGH (ref 44–60)
pCO2, Ven: 55.7 mmHg (ref 44–60)
pH, Ven: 7.13 — CL (ref 7.25–7.43)
pH, Ven: 7.242 — ABNORMAL LOW (ref 7.25–7.43)
pO2, Ven: 35 mmHg (ref 32–45)
pO2, Ven: 43 mmHg (ref 32–45)

## 2022-12-31 LAB — CBC
HCT: 23.5 % — ABNORMAL LOW (ref 36.0–46.0)
Hemoglobin: 7 g/dL — ABNORMAL LOW (ref 12.0–15.0)
MCH: 29.5 pg (ref 26.0–34.0)
MCHC: 29.8 g/dL — ABNORMAL LOW (ref 30.0–36.0)
MCV: 99.2 fL (ref 80.0–100.0)
Platelets: 108 10*3/uL — ABNORMAL LOW (ref 150–400)
RBC: 2.37 MIL/uL — ABNORMAL LOW (ref 3.87–5.11)
RDW: 16.6 % — ABNORMAL HIGH (ref 11.5–15.5)
WBC: 7.6 10*3/uL (ref 4.0–10.5)
nRBC: 0 % (ref 0.0–0.2)

## 2022-12-31 LAB — BASIC METABOLIC PANEL
Anion gap: 14 (ref 5–15)
BUN: 69 mg/dL — ABNORMAL HIGH (ref 8–23)
CO2: 21 mmol/L — ABNORMAL LOW (ref 22–32)
Calcium: 9 mg/dL (ref 8.9–10.3)
Chloride: 102 mmol/L (ref 98–111)
Creatinine, Ser: 2.13 mg/dL — ABNORMAL HIGH (ref 0.44–1.00)
GFR, Estimated: 22 mL/min — ABNORMAL LOW (ref >60)
Glucose, Bld: 185 mg/dL — ABNORMAL HIGH (ref 70–99)
Potassium: 4.6 mmol/L (ref 3.5–5.1)
Sodium: 137 mmol/L (ref 135–145)

## 2022-12-31 LAB — TROPONIN I (HIGH SENSITIVITY)
Troponin I (High Sensitivity): 26 ng/L — ABNORMAL HIGH (ref <18)
Troponin I (High Sensitivity): 1745 ng/L (ref <18)

## 2022-12-31 LAB — PROTIME-INR
INR: 1.1 (ref 0.8–1.2)
Prothrombin Time: 14.6 seconds (ref 11.4–15.2)

## 2022-12-31 LAB — GLUCOSE, CAPILLARY: Glucose-Capillary: 161 mg/dL — ABNORMAL HIGH (ref 70–99)

## 2022-12-31 LAB — LACTIC ACID, PLASMA
Lactic Acid, Venous: 2.7 mmol/L (ref 0.5–1.9)
Lactic Acid, Venous: 1.6 mmol/L (ref 0.5–1.9)

## 2022-12-31 LAB — PREPARE RBC (CROSSMATCH)

## 2022-12-31 LAB — BRAIN NATRIURETIC PEPTIDE: B Natriuretic Peptide: 967.6 pg/mL — ABNORMAL HIGH (ref 0.0–100.0)

## 2022-12-31 SURGERY — CORONARY/GRAFT ACUTE MI REVASCULARIZATION
Anesthesia: LOCAL

## 2022-12-31 MED ORDER — SODIUM CHLORIDE 0.9 % IV SOLN
500.0000 mg | Freq: Once | INTRAVENOUS | Status: AC
  Administered 2022-12-31: 500 mg via INTRAVENOUS
  Filled 2022-12-31: qty 5

## 2022-12-31 MED ORDER — CLOPIDOGREL BISULFATE 75 MG PO TABS
75.0000 mg | ORAL_TABLET | Freq: Every day | ORAL | Status: DC
  Administered 2023-01-01 – 2023-01-10 (×10): 75 mg via ORAL
  Filled 2022-12-31 (×10): qty 1

## 2022-12-31 MED ORDER — ISOSORBIDE MONONITRATE ER 60 MG PO TB24
90.0000 mg | ORAL_TABLET | Freq: Every day | ORAL | Status: DC
  Administered 2023-01-01 – 2023-01-10 (×10): 90 mg via ORAL
  Filled 2022-12-31 (×4): qty 1
  Filled 2022-12-31: qty 3
  Filled 2022-12-31 (×5): qty 1

## 2022-12-31 MED ORDER — ROSUVASTATIN CALCIUM 20 MG PO TABS
20.0000 mg | ORAL_TABLET | Freq: Every day | ORAL | Status: DC
  Administered 2023-01-01 – 2023-01-10 (×10): 20 mg via ORAL
  Filled 2022-12-31 (×10): qty 1

## 2022-12-31 MED ORDER — METHYLPREDNISOLONE SODIUM SUCC 125 MG IJ SOLR
INTRAMUSCULAR | Status: AC
  Administered 2022-12-31: 125 mg via INTRAVENOUS
  Filled 2022-12-31: qty 2

## 2022-12-31 MED ORDER — SODIUM CHLORIDE 0.9% IV SOLUTION: Freq: Once | INTRAVENOUS | Status: DC

## 2022-12-31 MED ORDER — HEPARIN (PORCINE) 25000 UT/250ML-% IV SOLN
600.0000 [IU]/h | INTRAVENOUS | Status: DC
  Administered 2022-12-31: 600 [IU]/h via INTRAVENOUS
  Filled 2022-12-31: qty 250

## 2022-12-31 MED ORDER — IPRATROPIUM-ALBUTEROL 0.5-2.5 (3) MG/3ML IN SOLN
3.0000 mL | Freq: Four times a day (QID) | RESPIRATORY_TRACT | Status: DC
  Administered 2023-01-01 (×2): 3 mL via RESPIRATORY_TRACT
  Filled 2022-12-31 (×2): qty 3

## 2022-12-31 MED ORDER — CHLORHEXIDINE GLUCONATE CLOTH 2 % EX PADS
6.0000 | MEDICATED_PAD | Freq: Every day | CUTANEOUS | Status: DC
  Administered 2023-01-01: 6 via TOPICAL

## 2022-12-31 MED ORDER — SODIUM CHLORIDE 0.9 % IV SOLN
2.0000 g | Freq: Once | INTRAVENOUS | Status: AC
  Administered 2022-12-31: 2 g via INTRAVENOUS
  Filled 2022-12-31: qty 20

## 2022-12-31 MED ORDER — HEPARIN BOLUS VIA INFUSION
2000.0000 [IU] | Freq: Once | INTRAVENOUS | Status: DC
  Filled 2022-12-31: qty 2000

## 2022-12-31 MED ORDER — FUROSEMIDE 10 MG/ML IJ SOLN
40.0000 mg | Freq: Once | INTRAMUSCULAR | Status: AC
  Administered 2022-12-31: 40 mg via INTRAVENOUS
  Filled 2022-12-31: qty 4

## 2022-12-31 MED ORDER — ASPIRIN 81 MG PO TBEC
81.0000 mg | DELAYED_RELEASE_TABLET | Freq: Every day | ORAL | Status: DC
  Administered 2023-01-01 – 2023-01-10 (×10): 81 mg via ORAL
  Filled 2022-12-31 (×10): qty 1

## 2022-12-31 MED ORDER — ALBUTEROL SULFATE (2.5 MG/3ML) 0.083% IN NEBU
5.0000 mg | INHALATION_SOLUTION | Freq: Once | RESPIRATORY_TRACT | Status: AC
  Administered 2022-12-31: 5 mg via RESPIRATORY_TRACT
  Filled 2022-12-31: qty 6

## 2022-12-31 MED ORDER — NITROGLYCERIN IN D5W 200-5 MCG/ML-% IV SOLN
0.0000 ug/min | INTRAVENOUS | Status: DC
  Administered 2022-12-31 – 2023-01-01 (×2): 50 ug/min via INTRAVENOUS
  Filled 2022-12-31: qty 250

## 2022-12-31 MED ORDER — POLYETHYLENE GLYCOL 3350 17 G PO PACK: 17.0000 g | PACK | Freq: Every day | ORAL | Status: DC | PRN

## 2022-12-31 MED ORDER — IPRATROPIUM-ALBUTEROL 0.5-2.5 (3) MG/3ML IN SOLN
3.0000 mL | Freq: Four times a day (QID) | RESPIRATORY_TRACT | Status: DC | PRN
  Administered 2023-01-01: 3 mL via RESPIRATORY_TRACT
  Filled 2022-12-31 (×2): qty 3

## 2022-12-31 MED ORDER — METOPROLOL TARTRATE 25 MG PO TABS
25.0000 mg | ORAL_TABLET | Freq: Two times a day (BID) | ORAL | Status: DC
  Administered 2023-01-01: 25 mg via ORAL
  Filled 2022-12-31: qty 1

## 2022-12-31 MED ORDER — IPRATROPIUM BROMIDE 0.02 % IN SOLN
0.5000 mg | Freq: Once | RESPIRATORY_TRACT | Status: AC
  Administered 2022-12-31: 0.5 mg via RESPIRATORY_TRACT
  Filled 2022-12-31: qty 2.5

## 2022-12-31 MED ORDER — IPRATROPIUM-ALBUTEROL 0.5-2.5 (3) MG/3ML IN SOLN: 3.0000 mL | Freq: Once | RESPIRATORY_TRACT | Status: DC

## 2022-12-31 MED ORDER — METHYLPREDNISOLONE SODIUM SUCC 125 MG IJ SOLR: 125.0000 mg | Freq: Once | INTRAMUSCULAR | Status: AC

## 2022-12-31 MED ORDER — DOCUSATE SODIUM 100 MG PO CAPS: 100.0000 mg | ORAL_CAPSULE | Freq: Two times a day (BID) | ORAL | Status: DC | PRN

## 2022-12-31 NOTE — Progress Notes (Signed)
Pt taken off BIPAP because she said her breathing was feeling better and she said the extra air was going in her belly and making her stomach hurt. Placed patient in 2L [place]. Pt tol well  Called 2H RT and gave report. Pt is now being transported by the RN.  Lisa [name]

## 2022-12-31 NOTE — ED Triage Notes (Signed)
Patient arrives via EMS. EMS reports patient is from home anc called out for chest pain that began at 3am this morning. Patient had MI 2 months ago and  was treated at [facility]. Patient was diaphoretic at on scene. EMS gave 325 ASA and 2mg  of morphine. Patient took nitro at home. Takes plavix

## 2022-12-31 NOTE — ED Notes (Signed)
Family is at bedside. °

## 2022-12-31 NOTE — Consult Note (Signed)
Cardiology Consultation   Patient ID: Lisa [name] MRN: [id number]; DOB: [birth year]  Admit date: 12/31/2022 Date of Consult: 12/31/2022  PCP:  Lisa Lade, MD   [facility] Providers Cardiologist:  Lisa Bicker, MD        Patient Profile:   Lisa [name] is a 88 y.o. female with a hx of chronic HFpEF, hypertension, chronic kidney disease stage IV, COPD, adenocarcinoma of the left lung status post radiation, and MGUS, who is being seen 12/31/2022 for the evaluation of STEMI at the request of Dr. Eloise [name].  History of Present Illness:   [name] is in severe respiratory distress at the time of my interview and cannot provide any history.  Per EMS, she began having chest pain and shortness of breath around 3 AM.  Due to worsening dyspnea, EMS was summoned.  She had already taken nitroglycerin and clopidogrel at home, having been treated medically for NSTEMI's last month and earlier this month.  She declined cardiac catheterization during both admissions.  EMS found her to be hypertensive and in respiratory distress.  She received aspirin and morphine.  She had already taken sublingual nitroglycerin at home.  In the ED, [name] complained of tightness in her chest, primarily trying to breathe.  She felt like it was difficult moving air.  She denied frank chest pain.  She cannot provide any further history.   Past Medical History:  Diagnosis Date   Anemia    Carotid artery stenosis    CKD (chronic kidney disease)    COPD (chronic obstructive pulmonary disease) (HCC)    Dyspnea    Due to COPD per patient   Goiter diffuse, nontoxic    Per patient   Hepatitis C    History of radiation therapy    Left lung-10/10/21-10/17/21- Dr. Antony [name]   Hypertension    Monoclonal gammopathy of unknown significance (MGUS)    Pyelonephritis 04/06/2014   UTI    Past Surgical History:  Procedure Laterality Date   ABDOMINAL HYSTERECTOMY      CHOLECYSTECTOMY N/A 09/17/2018   Procedure: LAPAROSCOPIC CHOLECYSTECTOMY;  Surgeon: Lisa Roers, MD;  Location: [facility];  Service: General;  Laterality: N/A;   FUDUCIAL PLACEMENT Left 09/01/2021   Procedure: PLACEMENT OF FIDUCIAL MARKERS TIMES FOUR;  Surgeon: Lisa Slot, MD;  Location: [facility] OR;  Service: Thoracic;  Laterality: Left;   TEE WITHOUT CARDIOVERSION N/A 04/10/2014   Procedure: TRANSESOPHAGEAL ECHOCARDIOGRAM (TEE);  Surgeon: Lisa Schultz, MD;  Location: [facility] ENDOSCOPY;  Service: Cardiovascular;  Laterality: N/A;   VIDEO BRONCHOSCOPY WITH ENDOBRONCHIAL NAVIGATION N/A 09/01/2021   Procedure: VIDEO BRONCHOSCOPY WITH ENDOBRONCHIAL NAVIGATION;  Surgeon: Lisa Slot, MD;  Location: [facility] OR;  Service: Thoracic;  Laterality: N/A;     Inpatient Medications: Scheduled Meds:  sodium chloride   Intravenous Once   albuterol  5 mg Nebulization Once   ipratropium  0.5 mg Nebulization Once   methylPREDNISolone sodium succinate  125 mg Intravenous Once   Continuous Infusions:  azithromycin (ZITHROMAX) 500 mg in sodium chloride 0.9 % 250 mL IVPB     cefTRIAXone (ROCEPHIN)  IV     heparin     nitroGLYCERIN     PRN Meds:   Allergies:   No Known Allergies  Social History:   Social History   Tobacco Use   Smoking status: Former    Current packs/day: 0.25    Types: Cigarettes    Passive exposure: Current   Smokeless  tobacco: Never   Tobacco comments:    Smoking Cessation Classes, Services, Agencies & Resources Offered.  Vaping Use   Vaping status: Never Used  Substance Use Topics   Alcohol use: No   Drug use: No      Family History:   Family History  Problem Relation Age of Onset   Hypertension Mother    Thyroid disease Mother    Hypertension Father    Thyroid disease Sister    Thyroid disease Brother    Thyroid disease Sister      ROS:  Unable to obtain due to acute illness.  Physical Exam/Data:   Vitals:   12/31/22 2007 12/31/22 2008 12/31/22 2015  12/31/22 2019  BP:  (!) 177/110 (!) 183/141   Pulse:  (!) 108 (!) 119   Resp:  18 (!) 27   Temp:    (!) 94.6 F (34.8 C)  TempSrc:    Oral  SpO2: 100% 100% 100%    No intake or output data in the 24 hours ending 12/31/22 2042    12/27/2022    2:04 PM 12/19/2022    1:13 AM 12/18/2022    5:00 AM  Last 3 Weights  Weight (lbs) -- 112 lb 7 oz 113 lb 5.1 oz  Weight (kg) -- 51 kg 51.4 kg     There is no height or weight on file to calculate BMI.  General: Frail, elderly woman sitting up on stretcher in obvious respiratory distress. HEENT: normal Neck: To assess JVP due to respiratory distress and accessory muscle use Cardiac: Tachycardic with but regular with 1/6 systolic murmur. Lungs: Breath sounds bilaterally, particularly at the lung bases, with poor air movement.  Patient noted to be tachypneic with accessory muscle use. Abd: soft, nontender, no hepatomegaly  Ext: Trace to 1+ pretibial edema bilaterally. Musculoskeletal:  No deformities, BUE and BLE strength normal and equal Skin: warm and dry  Neuro: Patient moves all 4 extremities.  EKG:  The EKG was personally reviewed and demonstrates: Sinus tachycardia with LVH, ST elevations in V1 and aVR as well as global ST depressions in the lateral and inferior leads.  Relevant CV Studies: TTE (11/16/2022): Normal LV size and wall thickness.  LVEF 55-60% with basal inferior and inferoseptal akinesis.  Grade 2 diastolic dysfunction.  Normal RV size and wall thickness with severe pulmonary hypertension.  No pericardial effusion.  Severe mitral annular calcification with mild MR.  Mild TR.  CVP 8 mmHg.  Laboratory Data:  High Sensitivity Troponin:   Recent Labs  Lab 12/11/22 0807 12/11/22 0942 12/16/22 0628 12/16/22 0837  TROPONINIHS 21* 36* 56* 2,115*     Chemistry Recent Labs  Lab 12/31/22 2023  NA 142  K 4.4    No results for input(s): "PROT", "ALBUMIN", "AST", "ALT", "ALKPHOS", "BILITOT" in the last 168 hours. Lipids No  results for input(s): "CHOL", "TRIG", "HDL", "LABVLDL", "LDLCALC", "CHOLHDL" in the last 168 hours.  Hematology Recent Labs  Lab 12/27/22 1228 12/31/22 2012 12/31/22 2023  WBC 8.4 7.6  --   RBC 2.58* 2.37*  --   HGB 7.7* 7.0* 7.8*  HCT 24.6* 23.5* 23.0*  MCV 95.3 99.2  --   MCH 29.8 29.5  --   MCHC 31.3 29.8*  --   RDW 16.8* 16.6*  --   PLT 102* 108*  --    Thyroid No results for input(s): "TSH", "FREET4" in the last 168 hours.  BNPNo results for input(s): "BNP", "PROBNP" in the last 168 hours.  DDimer  No results for input(s): "DDIMER" in the last 168 hours.   Radiology/Studies:  DG Chest Portable 1 View  Result Date: 12/31/2022 CLINICAL DATA:  Shortness of breath and chest pain, initial encounter EXAM: PORTABLE CHEST 1 VIEW COMPARISON:  12/16/2022 FINDINGS: Cardiac shadow is stable. Persistent left apical mass is noted with fiducial markers in place. Lungs are well aerated. Persistent upper lobe opacities are seen which correspond to ground-glass opacities on recent CT examination. No new focal abnormality is noted. IMPRESSION: Overall stable appearance of the chest when compared with the prior exam. Electronically Signed   By: Alcide Clever M.D.   On: 12/31/2022 20:33     Assessment and Plan:   Chest tightness and abnormal EKG: Tightness described by the patient is more consistent with her respiratory distress.  Her EKG shows marked ST segment depressions in the majority of the leads as well as ST elevation in V1 and aVR.  Her EKG is concerning for global ischemia as could be seen with severe three-vessel coronary artery disease or left main coronary artery disease.  [name] has previously declined cardiac catheterizations during admissions earlier this month and last month for non-STEMI's.  I spoke with her again this evening and she continues to decline catheterization.  Given her advanced age and comorbidities, I think this is reasonable.  Her severe hypertension today is  likely contributing to demand ischemia and respiratory distress.  I recommend initiation of IV nitrates and gentle diuresis.  Her chronic anemia, which has drifted down to a hemoglobin of 7, may also be contributing.  While I would ordinarily recommend heparin in the situation, in the setting of her anemia and mild thrombocytopenia, it may be worthwhile to defer this until we see what her troponin trend does.  Acute on chronic HFpEF: Respiratory distress is probably driven at least in some part by acute HFpEF despite chest x-ray not showing any significant changes.  Recommend initiation of IV nitroglycerin to help decrease preload and afterload.  Gentle diuresis should be considered, though in the setting of severe pulmonary hypertension noted on echo last month, we will need to be careful about dropping her preload too aggressively as this could precipitate systemic hypotension.  Chronic kidney disease stage IV: AKI noted today with creatinine of 2.1 (previously 2.5 on 12/17/2022).  Recommend avoiding nephrotoxic agents.  Could consider gentle diuresis for respiratory distress.  Acute on chronic respiratory failure with hypoxia and hypercapnia: This is likely multifactorial including chronic lung disease (COPD and adenocarcinoma status post radiation) as well as an element of acute on chronic HFpEF with hypertensive emergency.  Continue supportive care with BiPAP.  Patient very well may require intubation mechanical ventilation.  I spoke with her briefly about this and she would like to undergo intubation if necessary.  Chronic anemia: Longstanding and likely due to renal disease and MGUS.  Consider PRBC transfusion for target hemoglobin greater than 8, given acute shortness of breath and concern for demand ischemia.  We will continue to follow along.  For questions or updates, please contact [facility] Please consult [email or web address] for contact info under   Signed, Yvonne Kendall, MD   12/31/2022 8:42 PM

## 2022-12-31 NOTE — ED Provider Notes (Signed)
[place] EMERGENCY DEPARTMENT AT [facility] Provider Note   CSN: [id number] Arrival date & time: 12/31/22  2003     History  Chief Complaint  Patient presents with   Code STEMI    Lisa [name] is a 88 y.o. female.  88 year old female with history of CKD, COPD, CAD, CHF, and anemia who presents emergency department chest pain and shortness of breath.  Patient states that earlier today started having chest discomfort.  Took 4-5 nitroglycerin and told her family who called 911.  When EMS arrived she was short of breath and was placed on a nonrebreather due to her work of breathing.  Had some ST elevations on the EKG and so they activated code STEMI in the field.  Patient denies any fevers or cough recently.  No leg swelling.  Was recently admitted for an NSTEMI but declined cardiac catheterization.  Is currently on aspirin and Plavix but no other blood thinners.  Additional history limited due to respiratory distress.  Patient was loaded with aspirin on the way to the emergency department by EMS.       Home Medications Prior to Admission medications   Medication Sig Start Date End Date Taking? Authorizing Provider  aspirin 81 MG EC tablet Take 81 mg by mouth daily.   Yes [provider]  Calcium Carbonate Antacid (TUMS PO) Take 1 tablet by mouth daily.   Yes [provider]  Cholecalciferol (VITAMIN D3) 125 MCG (5000 UT) CAPS Take 5,000 Units by mouth daily.   Yes [provider]  clopidogrel (PLAVIX) 75 MG tablet Take 1 tablet (75 mg total) by mouth daily. 11/22/22  Yes Johnson, Clanford L, MD  feeding supplement (ENSURE ENLIVE / ENSURE PLUS) LIQD Take 237 mLs by mouth 2 (two) times daily between meals. 12/31/21  Yes Shah, Pratik D, DO  furosemide (LASIX) 20 MG tablet Take 1 tablet (20 mg total) by mouth daily. 12/08/22 03/08/23 Yes Strader, Lennart Pall, PA-C  Ipratropium-Albuterol (COMBIVENT RESPIMAT) 20-100 MCG/ACT AERS respimat Inhale 1 puff  into the lungs every 6 (six) hours as needed for wheezing or shortness of breath. 12/21/22  Yes [name], MD  ipratropium-albuterol (DUONEB) 0.5-2.5 (3) MG/3ML SOLN Take 3 mLs by nebulization every 4 (four) hours as needed (wheezing/shortness of breath).   Yes [provider]  Iron, Ferrous Sulfate, 325 (65 Fe) MG TABS Take 325 mg by mouth daily.   Yes [provider]  isosorbide mononitrate (IMDUR) 60 MG 24 hr tablet Take 1.5 tablets (90 mg total) by mouth daily. 12/12/22 01/11/23 Yes [name], MD  metoprolol tartrate (LOPRESSOR) 25 MG tablet Take 1 tablet (25 mg total) by mouth 2 (two) times daily. 11/21/22  Yes Johnson, Clanford L, MD  Multiple Vitamin (MULTIVITAMIN WITH MINERALS) TABS tablet Take 1 tablet by mouth daily. 11/22/22  Yes Johnson, Clanford L, MD  nitroGLYCERIN (NITROSTAT) 0.4 MG SL tablet Place 1 tablet (0.4 mg total) under the tongue every 5 (five) minutes as needed for chest pain. 12/21/22 12/21/23 Yes [name], MD  ranolazine (RANEXA) 500 MG 12 hr tablet Take 1 tablet (500 mg total) by mouth 2 (two) times daily. 12/19/22  Yes Vassie Loll, MD  rosuvastatin (CRESTOR) 20 MG tablet Take 1 tablet (20 mg total) by mouth daily at 6 PM. 12/21/22  Yes [name], MD      Allergies    Patient has no known allergies.    Review of Systems   Review of  Systems  Physical Exam Updated Vital Signs BP 134/69   Pulse 86   Temp (!) 94.6 F (34.8 C) (Oral)   Resp (!) 22   SpO2 100%  Physical Exam Vitals and nursing note reviewed.  Constitutional:      General: She is in acute distress.     Appearance: She is well-developed. She is ill-appearing.  HENT:     Head: Normocephalic and atraumatic.     Right Ear: External ear normal.     Left Ear: External ear normal.     Nose: Nose normal.  Eyes:     Extraocular Movements: Extraocular movements intact.     Conjunctiva/sclera: Conjunctivae normal.     Pupils: Pupils are equal, round, and reactive  to light.  Cardiovascular:     Rate and Rhythm: Regular rhythm. Tachycardia present.     Heart sounds: No murmur heard. Pulmonary:     Effort: Respiratory distress present.     Breath sounds: Wheezing (Faint expiratory) present.     Comments: Initially on nonrebreather and then was placed on BiPAP Musculoskeletal:     Cervical back: Normal range of motion and neck supple.     Right lower leg: No edema.     Left lower leg: No edema.  Skin:    General: Skin is warm and dry.  Neurological:     Mental Status: She is alert and oriented to person, place, and time. Mental status is at baseline.  Psychiatric:        Mood and Affect: Mood normal.     ED Results / Procedures / Treatments   Labs (all labs ordered are listed, but only abnormal results are displayed) Labs Reviewed  BASIC METABOLIC PANEL - Abnormal; Notable for the following components:      Result Value   CO2 21 (*)    Glucose, Bld 185 (*)    BUN 69 (*)    Creatinine, Ser 2.13 (*)    GFR, Estimated 22 (*)    All other components within normal limits  CBC - Abnormal; Notable for the following components:   RBC 2.37 (*)    Hemoglobin 7.0 (*)    HCT 23.5 (*)    MCHC 29.8 (*)    RDW 16.6 (*)    Platelets 108 (*)    All other components within normal limits  BRAIN NATRIURETIC PEPTIDE - Abnormal; Notable for the following components:   B Natriuretic Peptide 967.6 (*)    All other components within normal limits  I-STAT VENOUS BLOOD GAS, ED - Abnormal; Notable for the following components:   pH, Ven 7.130 (*)    pCO2, Ven 64.3 (*)    Acid-base deficit 8.0 (*)    HCT 23.0 (*)    Hemoglobin 7.8 (*)    All other components within normal limits  TROPONIN I (HIGH SENSITIVITY) - Abnormal; Notable for the following components:   Troponin I (High Sensitivity) 26 (*)    All other components within normal limits  RESP PANEL BY RT-PCR (RSV, FLU A&B, COVID)  RVPGX2  CULTURE, BLOOD (ROUTINE X 2)  CULTURE, BLOOD (ROUTINE X 2)   PROTIME-INR  HEPARIN LEVEL (UNFRACTIONATED)  LACTIC ACID, PLASMA  LACTIC ACID, PLASMA  I-STAT ARTERIAL BLOOD GAS, ED  I-STAT VENOUS BLOOD GAS, ED  PREPARE RBC (CROSSMATCH)  TYPE AND SCREEN  TROPONIN I (HIGH SENSITIVITY)    EKG EKG Interpretation Date/Time:  Sunday December 31 2022 20:10:06 EDT Ventricular Rate:  114 PR Interval:  135 QRS Duration:  122 QT Interval:  370 QTC Calculation: 510 R Axis:   30  Text Interpretation: Sinus tachycardia Left bundle branch block Artifact in lead(s) I II III aVR aVL aVF V1 V2 V6 and baseline wander in lead(s) V2 ST elevation in aVR adn V1 with diffuse depressions Confirmed by Vonita Moss [phone]) on 12/31/2022 8:25:33 PM  Radiology DG Chest Portable 1 View  Result Date: 12/31/2022 CLINICAL DATA:  Shortness of breath and chest pain, initial encounter EXAM: PORTABLE CHEST 1 VIEW COMPARISON:  12/16/2022 FINDINGS: Cardiac shadow is stable. Persistent left apical mass is noted with fiducial markers in place. Lungs are well aerated. Persistent upper lobe opacities are seen which correspond to ground-glass opacities on recent CT examination. No new focal abnormality is noted. IMPRESSION: Overall stable appearance of the chest when compared with the prior exam. Electronically Signed   By: Alcide Clever M.D.   On: 12/31/2022 20:33    Procedures Procedures    Medications Ordered in ED Medications  azithromycin (ZITHROMAX) 500 mg in sodium chloride 0.9 % 250 mL IVPB (500 mg Intravenous New Bag/Given 12/31/22 2137)  nitroGLYCERIN 50 mg in dextrose 5 % 250 mL (0.2 mg/mL) infusion (10 mcg/min Intravenous Rate/Dose Change 12/31/22 2152)  0.9 %  sodium chloride infusion (Manually program via Guardrails IV Fluids) (0 mLs Intravenous Hold 12/31/22 2105)  heparin ADULT infusion 100 units/mL (25000 units/282mL) (600 Units/hr Intravenous New Bag/Given 12/31/22 2055)  methylPREDNISolone sodium succinate (SOLU-MEDROL) 125 mg/2 mL injection 125 mg (125 mg Intravenous  Given 12/31/22 2101)  cefTRIAXone (ROCEPHIN) 2 g in sodium chloride 0.9 % 100 mL IVPB (0 g Intravenous Stopped 12/31/22 2127)  albuterol (PROVENTIL) (2.5 MG/3ML) 0.083% nebulizer solution 5 mg (5 mg Nebulization Given 12/31/22 2035)  ipratropium (ATROVENT) nebulizer solution 0.5 mg (0.5 mg Nebulization Given 12/31/22 2035)    ED Course/ Medical Decision Making/ A&P Clinical Course as of 12/31/22 2203  Sun Dec 31, 2022  2014 Dr End from cardiology at the bedside.  States that they will not activate Cath Lab since patient declined catheterization on her last hospitalization and she is in acute distress.  Recommends treating her medically at this time. [RP]  2053 Dr Gaynell Face from ICU to evaluate the patient for admission. [RP]    Clinical Course User Index [RP] Rondel Baton, MD                             Medical Decision Making Amount and/or Complexity of Data Reviewed Labs: ordered. Radiology: ordered.  Risk Prescription drug management. Decision regarding hospitalization.   Fatemah Pourciau is a 88 y.o. female with comorbidities that complicate the patient evaluation including CKD, COPD, CAD, CHF, and anemia who presents emergency department chest pain and shortness of breath.   Initial Ddx:  MI, hypertensive emergency, PE, pneumonia, COPD exacerbation  MDM/Course:  Patient presents emergency department with chest pain and shortness of breath.  On arrival was severely hypertensive and was in respiratory distress.  Also arrived as a code STEMI due to ST elevations that were found by EMS.  She was evaluated by cardiology on arrival to the emergency department.  They recommended treating her medically at this time.  Patient was placed on BiPAP and nitro drip and heparin drip for suspected hypertensive emergency that was causing her chest discomfort and ST changes on her EKG.  Already given aspirin by EMS.  Upon reevaluation, she was much more comfortable after being started on on  these interventions and her chest pain had resolved.  Of note, patient was having some mild amount of wheezing and did have respiratory acidosis on her VBG.  Was given DuoNebs and Solu-Medrol as well as ceftriaxone and azithromycin since she is likely having a mild COPD exacerbation on BiPAP.  Will repeat her blood gas shortly.  Patient was discussed with ICU for admission.  Discussed CODE STATUS with the patient and her niece.  She requests to be intubated and placed on a ventilator for respiratory failure but does not want CPR or defibrillation in the case of a cardiac arrest.  CODE STATUS was updated in the computer.  This patient presents to the ED for concern of complaints listed in HPI, this involves an extensive number of treatment options, and is a complaint that carries with it a high risk of complications and morbidity. Disposition including potential need for admission considered.   Dispo: ICU  Additional history obtained from family Records reviewed Outpatient Clinic Notes and Admission Notes The following labs were independently interpreted: Chemistry and show AKI I independently reviewed the following imaging with scope of interpretation limited to determining acute life threatening conditions related to emergency care: Chest x-ray and agree with the radiologist interpretation with the following exceptions: none I personally reviewed and interpreted cardiac monitoring: sinus tachycardia I personally reviewed and interpreted the pt's EKG: see above for interpretation  I have reviewed the patients home medications and made adjustments as needed Consults: Cardiology and Critical care Social Determinants of health:  Elderly  CRITICAL CARE Performed by: Rondel Baton   Total critical care time: 60 minutes  Critical care time was exclusive of separately billable procedures and treating other patients.  Critical care was necessary to treat or prevent imminent or  life-threatening deterioration.  Critical care was time spent personally by me on the following activities: development of treatment plan with patient and/or surrogate as well as nursing, discussions with consultants, evaluation of patient's response to treatment, examination of patient, obtaining history from patient or surrogate, ordering and performing treatments and interventions, ordering and review of laboratory studies, ordering and review of radiographic studies, pulse oximetry and re-evaluation of patient's condition.   Final Clinical Impression(s) / ED Diagnoses Final diagnoses:  Hypertensive emergency  NSTEMI (non-ST elevated myocardial infarction) (HCC)  Respiratory distress  COPD exacerbation (HCC)    Rx / DC Orders ED Discharge Orders     None         Rondel Baton, MD 12/31/22 2203

## 2022-12-31 NOTE — Progress Notes (Addendum)
ANTICOAGULATION CONSULT NOTE - Initial Consult  Pharmacy Consult for heparin Indication: chest pain/ACS  No Known Allergies  Patient Measurements:   Heparin Dosing Weight: 51 kg   Vital Signs: Temp: 94.6 F (34.8 C) (07/21 2019) Temp Source: Oral (07/21 2019) BP: 183/141 (07/21 2015) Pulse Rate: 119 (07/21 2015)  Labs: Recent Labs    12/31/22 2012 12/31/22 2023  HGB 7.0* 7.8*  HCT 23.5* 23.0*  PLT 108*  --     Estimated Creatinine Clearance: 22.5 mL/min (A) (by C-G formula based on SCr of 1.47 mg/dL (H)).   Medical History: Past Medical History:  Diagnosis Date   Anemia    Carotid artery stenosis    CKD (chronic kidney disease)    COPD (chronic obstructive pulmonary disease) (HCC)    Dyspnea    Due to COPD per patient   Goiter diffuse, nontoxic    Per patient   Hepatitis C    History of radiation therapy    Left lung-10/10/21-10/17/21- Dr. Antony Blackbird   Hypertension    Monoclonal gammopathy of unknown significance (MGUS)    Pyelonephritis 04/06/2014   UTI    Medications:  Scheduled:   albuterol  5 mg Nebulization Once   ipratropium  0.5 mg Nebulization Once   methylPREDNISolone sodium succinate  125 mg Intravenous Once    Assessment: 87 yof presenting with chest pain - no AC PTA (on plavix PTA). Was paged as code STEMI but declined invasive treatment.   Hgb 7, plt 108. No s/sx of bleeding.  Goal of Therapy:  Heparin level 0.3-0.7 units/ml Monitor platelets by anticoagulation protocol: Yes   Plan:  Given hx anemia and intermittent blood transfusion requirements, will hold on bolus Start heparin infusion at 600 units/hr Order heparin level in 8 hours Monitor daily HL, CBC, and for s/sx of bleeding  Thank you for allowing pharmacy to participate in this patient's care,  Sherron Monday, PharmD, BCCCP Clinical Pharmacist  Phone: [phone] 12/31/2022 8:47 PM  Please check AMION for all [facility] phone numbers After 10:00 PM, call Main  Pharmacy [phone]

## 2022-12-31 NOTE — Progress Notes (Signed)
eLink Physician-Brief Progress Note Patient Name: Channie Bostick DOB: Feb 02, 1935 MRN: [id number]   Date of Service  12/31/2022  HPI/Events of Note  88 year old female with a history of CKD, carotid artery stenosis, COPD, and heart failure with multiple recent hospitalizations to [facility] with conservatively managed NSTEMI's who presented to the emergency department on 7/21 with complaints of shortness of breath and chest tightness found to be in hypertensive emergency.  BiPAP was initiated for increased work of breathing and initiated on nitroglycerin infusion.  Laboratory studies consistent with acute metabolic acidosis.  Drastic troponin elevation from 26->1745 in the setting of chronic anemia.  ECG with diffuse ST depressions and ST elevation in aVR and V1.  eICU Interventions  Patient is DNR.  Utilizing BiPAP as needed.  Evidence of acute kidney injury, acute myocardial injury, and acute respiratory failure signifying multiorgan dysfunction and poor prognosis especially with advanced age.  Maintain heparin drip, dual antiplatelet therapy  GI prophylaxis not indicated   0544 -clarification of SBP goal.  Goal has clearly been MAP goal less than 100.  This is a 25% reduction compared to presentation.  Intervention Category Evaluation Type: New Patient Evaluation  Lulamae Skorupski 12/31/2022, 11:05 PM

## 2022-12-31 NOTE — ED Notes (Signed)
X-ray at bedside

## 2022-12-31 NOTE — H&P (Cosign Needed Addendum)
NAME:  Lisa [name], MRN:  [id number], DOB:  January 11, 1935, LOS: 0 ADMISSION DATE:  12/31/2022, CONSULTATION DATE:  7/21 REFERRING MD:  Dr. Eloise Harman EDP, CHIEF COMPLAINT:   Dyspnea  History of Present Illness:  88 year old female with past medical history as below, which is significant for coronary artery disease, CKD, COPD, former smoker, former lung cancer status post radiation, MGUS, and hypertension.  She has 2 recent admissions to [facility] for NSTEMI and has refused cardiac catheterization on both admissions.  She has been managed medically.  She presents again to [facility] emergency department on 7/21 with complaints of shortness of breath and chest tightness.  These complaints are similar to prior presentations.  Upon arrival to the emergency department she was noted to be profoundly hypertensive and in significant respiratory distress.  She was initiated on BiPAP for work of breathing.  EKG significant for diffuse ST changes and cardiology was consulted.  Nitroglycerin infusion was started for chest pain and blood pressure control.  PCCM was asked to admit to ICU for hypertensive emergency.  Pertinent  Medical History   has a past medical history of Anemia, Carotid artery stenosis, CKD (chronic kidney disease), COPD (chronic obstructive pulmonary disease) (HCC), Dyspnea, Goiter diffuse, nontoxic, Hepatitis C, History of radiation therapy, Hypertension, Monoclonal gammopathy of unknown significance (MGUS), and Pyelonephritis (04/06/2014).   Significant Hospital Events: Including procedures, antibiotic start and stop dates in addition to other pertinent events     Interim History / Subjective:    Objective   Blood pressure (!) 181/86, pulse 98, temperature (!) 94.6 F (34.8 C), temperature source Oral, resp. rate (!) 25, SpO2 100%.    FiO2 (%):  [15 %-100 %] 70 %  No intake or output data in the 24 hours ending 12/31/22 2130 There were no vitals filed for this  visit.  Examination: General: Thin elderly appearing female in no acute distress on BiPAP HENT: Normocephalic, atraumatic, PERRL, mild JVP elevation Lungs: Poor air movement throughout, expiratory wheeze Cardiovascular: Regular rate and rhythm, no MRG.  No peripheral edema Abdomen: Soft, nontender, nondistended Extremities: No acute deformity or range of motion limitation Neuro: Alert, oriented, nonfocal  Echocardiogram 6/6: Severe pulmonary hypertension.  LVEF 55 to 60%.  Did demonstrate regional wall motion abnormalities.  Grade 2 diastolic dysfunction.  Mild MR.  Resolved Hospital Problem list     Assessment & Plan:   Hypertensive emergency: As evidenced by MAP 150 and significant respiratory distress on presentation.  She reports taking her antihypertensive meds as prescribed. -Admit to ICU for close hemodynamic monitoring -Nitroglycerin infusion for blood pressure control -MAP reduction 25% overnight with goal of 110-1 20 -Will restart home antihypertensives for their morning doses. Imdur, metoprolol. Hold ranexa while on ntg drip. Restart when able.  -I suspect she will be stable for transfer out of the ICU tomorrow  Acute respiratory failure with hypoxia: Likely secondary to pulmonary edema in the setting of above.  No history which would indicate infectious etiology.  She does have masslike consolidation on CXR but this is unchanged from previous imaging and is likely left over from her lung cancer/radiation.  Anemia may also be contributing. COPD +\-acute exacerbation -As needed BiPAP -Okay to take a break from BiPAP when she gets up to the ICU if she wishes -Gentle diuresis okay, given severe pulmonary hypertension need to be careful not to drop preload too much. -Scheduled DuoNebs, as needed albuterol -DC antibiotics - Steroids given in ED. Defer further dosing for  now. Re-evaluate.  CAD: 2 recent admissions for NSTEMI, declined invasive workup at that time. Chest pain:  EKG with diffuse ST changes and troponin very minimally elevated. Acute on chronic HFpEF -Appreciate cardiology consultation -Plans for medical management with nitroglycerin infusion -Continue asa, statin, plavix -Currently on heparin infusion per pharmacy as well, low threshold to DC in the setting of anemia and thrombocytopenia  Anemia: Hemoglobin 7 Thrombocytopenia -Transfusing 1 unit PRBC as this may be contributing to her dyspnea -Repeat H&H in the morning  CKD stage IV -Follow chemistries -Strict I&O  Best Practice (right click and "Reselect all SmartList Selections" daily)   Diet/type: Regular consistency (see orders) DVT prophylaxis: systemic heparin GI prophylaxis: N/A Lines: N/A Foley:  N/A Code Status:  limited Last date of multidisciplinary goals of care discussion [ Discussed GOC with pt and family present in ED. Ok for intubation if necessary. No CPR.]  Labs   CBC: Recent Labs  Lab 12/27/22 1228 12/31/22 2012 12/31/22 2023  WBC 8.4 7.6  --   NEUTROABS 7.0  --   --   HGB 7.7* 7.0* 7.8*  HCT 24.6* 23.5* 23.0*  MCV 95.3 99.2  --   PLT 102* 108*  --     Basic Metabolic Panel: Recent Labs  Lab 12/31/22 2012 12/31/22 2023  NA 137 142  K 4.6 4.4  CL 102  --   CO2 21*  --   GLUCOSE 185*  --   BUN 69*  --   CREATININE 2.13*  --   CALCIUM 9.0  --    GFR: Estimated Creatinine Clearance: 15.5 mL/min (A) (by C-G formula based on SCr of 2.13 mg/dL (H)). Recent Labs  Lab 12/27/22 1228 12/31/22 2012  WBC 8.4 7.6    Liver Function Tests: No results for input(s): "AST", "ALT", "ALKPHOS", "BILITOT", "PROT", "ALBUMIN" in the last 168 hours. No results for input(s): "LIPASE", "AMYLASE" in the last 168 hours. No results for input(s): "AMMONIA" in the last 168 hours.  ABG    Component Value Date/Time   PHART 7.43 11/15/2022 2259   PCO2ART 40 11/15/2022 2259   PO2ART 140 (H) 11/15/2022 2259   HCO3 21.4 12/31/2022 2023   TCO2 23 12/31/2022 2023    ACIDBASEDEF 8.0 (H) 12/31/2022 2023   O2SAT 48 12/31/2022 2023     Coagulation Profile: Recent Labs  Lab 12/31/22 2012  INR 1.1    Cardiac Enzymes: No results for input(s): "CKTOTAL", "CKMB", "CKMBINDEX", "TROPONINI" in the last 168 hours.  HbA1C: No results found for: "HGBA1C"  CBG: No results for input(s): "GLUCAP" in the last 168 hours.  Review of Systems:   Bolds are positive  Constitutional: weight loss, gain, night sweats, Fevers, chills, fatigue .  HEENT: headaches, Sore throat, sneezing, nasal congestion, post nasal drip, Difficulty swallowing, Tooth/dental problems, visual complaints visual changes, ear ache CV:  chest pain, radiates:,Orthopnea, PND, swelling in lower extremities, dizziness, palpitations, syncope.  GI  heartburn, indigestion, abdominal pain, nausea, vomiting, diarrhea, change in bowel habits, loss of appetite, bloody stools.  Resp: cough, productive: , hemoptysis, dyspnea, chest pain, pleuritic.  Skin: rash or itching or icterus GU: dysuria, change in color of urine, urgency or frequency. flank pain, hematuria  MS: joint pain or swelling. decreased range of motion  Psych: change in mood or affect. depression or anxiety.  Neuro: difficulty with speech, weakness, numbness, ataxia    Past Medical History:  She,  has a past medical history of Anemia, Carotid artery stenosis, CKD (chronic kidney disease), COPD (  chronic obstructive pulmonary disease) (HCC), Dyspnea, Goiter diffuse, nontoxic, Hepatitis C, History of radiation therapy, Hypertension, Monoclonal gammopathy of unknown significance (MGUS), and Pyelonephritis (04/06/2014).   Surgical History:   Past Surgical History:  Procedure Laterality Date   ABDOMINAL HYSTERECTOMY     CHOLECYSTECTOMY N/A 09/17/2018   Procedure: LAPAROSCOPIC CHOLECYSTECTOMY;  Surgeon: Lucretia Roers, MD;  Location: [facility] ORS;  Service: General;  Laterality: N/A;   FUDUCIAL PLACEMENT Left 09/01/2021   Procedure: PLACEMENT OF  FIDUCIAL MARKERS TIMES FOUR;  Surgeon: Loreli Slot, MD;  Location: [facility] OR;  Service: Thoracic;  Laterality: Left;   TEE WITHOUT CARDIOVERSION N/A 04/10/2014   Procedure: TRANSESOPHAGEAL ECHOCARDIOGRAM (TEE);  Surgeon: Donato Schultz, MD;  Location: [facility] ENDOSCOPY;  Service: Cardiovascular;  Laterality: N/A;   VIDEO BRONCHOSCOPY WITH ENDOBRONCHIAL NAVIGATION N/A 09/01/2021   Procedure: VIDEO BRONCHOSCOPY WITH ENDOBRONCHIAL NAVIGATION;  Surgeon: Loreli Slot, MD;  Location: [facility] OR;  Service: Thoracic;  Laterality: N/A;     Social History:   reports that she has quit smoking. Her smoking use included cigarettes. She has been exposed to tobacco smoke. She has never used smokeless tobacco. She reports that she does not drink alcohol and does not use drugs.   Family History:  Her family history includes Hypertension in her father and mother; Thyroid disease in her brother, mother, sister, and sister.   Allergies No Known Allergies   Home Medications  Prior to Admission medications   Medication Sig Start Date End Date Taking? Authorizing Provider  aspirin 81 MG EC tablet Take 81 mg by mouth daily.   Yes [provider]  Calcium Carbonate Antacid (TUMS PO) Take 1 tablet by mouth daily.   Yes [provider]  Cholecalciferol (VITAMIN D3) 125 MCG (5000 UT) CAPS Take 5,000 Units by mouth daily.   Yes [provider]  clopidogrel (PLAVIX) 75 MG tablet Take 1 tablet (75 mg total) by mouth daily. 11/22/22  Yes Johnson, Clanford L, MD  feeding supplement (ENSURE ENLIVE / ENSURE PLUS) LIQD Take 237 mLs by mouth 2 (two) times daily between meals. 12/31/21  Yes Shah, Pratik D, DO  furosemide (LASIX) 20 MG tablet Take 1 tablet (20 mg total) by mouth daily. 12/08/22 03/08/23 Yes Strader, Lennart Pall, PA-C  Ipratropium-Albuterol (COMBIVENT RESPIMAT) 20-100 MCG/ACT AERS respimat Inhale 1 puff into the lungs every 6 (six) hours as needed for wheezing or shortness of breath. 12/21/22   Yes [name], MD  ipratropium-albuterol (DUONEB) 0.5-2.5 (3) MG/3ML SOLN Take 3 mLs by nebulization every 4 (four) hours as needed (wheezing/shortness of breath).   Yes [provider]  Iron, Ferrous Sulfate, 325 (65 Fe) MG TABS Take 325 mg by mouth daily.   Yes [provider]  isosorbide mononitrate (IMDUR) 60 MG 24 hr tablet Take 1.5 tablets (90 mg total) by mouth daily. 12/12/22 01/11/23 Yes [name], MD  metoprolol tartrate (LOPRESSOR) 25 MG tablet Take 1 tablet (25 mg total) by mouth 2 (two) times daily. 11/21/22  Yes Johnson, Clanford L, MD  Multiple Vitamin (MULTIVITAMIN WITH MINERALS) TABS tablet Take 1 tablet by mouth daily. 11/22/22  Yes Johnson, Clanford L, MD  nitroGLYCERIN (NITROSTAT) 0.4 MG SL tablet Place 1 tablet (0.4 mg total) under the tongue every 5 (five) minutes as needed for chest pain. 12/21/22 12/21/23 Yes [name], MD  ranolazine (RANEXA) 500 MG 12 hr tablet Take 1 tablet (500 mg total) by mouth 2 (two) times daily. 12/19/22  Yes Vassie Loll, MD  rosuvastatin (  CRESTOR) 20 MG tablet Take 1 tablet (20 mg total) by mouth daily at 6 PM. 12/21/22  Yes [name], MD     Critical care time: 45 min      Joneen Roach, AGACNP-BC [facility]  See Amion for personal pager PCCM on call pager [phone] until 7pm. Please call Elink 7p-7a. [phone]  12/31/2022 10:04 PM

## 2022-12-31 NOTE — ED Notes (Signed)
ED TO INPATIENT HANDOFF REPORT  ED Nurse Name and Phone #: Kathryne Hitch [phone]  S Name/Age/Gender Lisa [name] 88 y.o. female Room/Bed: TRAAC/TRAAC  Code Status   Code Status: DNR  Home/SNF/Other Home Patient oriented to: self, place, time, and situation Is this baseline? Yes   Triage Complete: Triage complete  Chief Complaint Hypertensive emergency [I16.1]  Triage Note Patient arrives via EMS. EMS reports patient is from home anc called out for chest pain that began at 3am this morning. Patient had MI 2 months ago and  was treated at [facility]. Patient was diaphoretic at on scene. EMS gave 325 ASA and 2mg  of morphine. Patient took nitro at home. Takes plavix   Allergies No Known Allergies  Level of Care/Admitting Diagnosis ED Disposition     ED Disposition  Admit   Condition  --   Comment  Hospital Area: [facility] [[id number]]  Level of Care: ICU [6]  May admit patient to [facility] or [facility] if equivalent level of care is available:: No  Covid Evaluation: Symptomatic Person Under Investigation (PUI) or recent exposure (last 10 days) *Testing Required*  Diagnosis: Hypertensive emergency [phone]  Admitting Physician: Briant Sites [[id number]]  Attending Physician: Briant Sites [id number]  Certification:: I certify this patient will need inpatient services for at least 2 midnights  Estimated Length of Stay: 5          B Medical/Surgery History Past Medical History:  Diagnosis Date   Anemia    Carotid artery stenosis    CKD (chronic kidney disease)    COPD (chronic obstructive pulmonary disease) (HCC)    Dyspnea    Due to COPD per patient   Goiter diffuse, nontoxic    Per patient   Hepatitis C    History of radiation therapy    Left lung-10/10/21-10/17/21- Dr. Antony Blackbird   Hypertension    Monoclonal gammopathy of unknown significance (MGUS)    Pyelonephritis 04/06/2014   UTI   Past Surgical History:  Procedure  Laterality Date   ABDOMINAL HYSTERECTOMY     CHOLECYSTECTOMY N/A 09/17/2018   Procedure: LAPAROSCOPIC CHOLECYSTECTOMY;  Surgeon: Lucretia Roers, MD;  Location: [facility] ORS;  Service: General;  Laterality: N/A;   FUDUCIAL PLACEMENT Left 09/01/2021   Procedure: PLACEMENT OF FIDUCIAL MARKERS TIMES FOUR;  Surgeon: Loreli Slot, MD;  Location: [facility] OR;  Service: Thoracic;  Laterality: Left;   TEE WITHOUT CARDIOVERSION N/A 04/10/2014   Procedure: TRANSESOPHAGEAL ECHOCARDIOGRAM (TEE);  Surgeon: Donato Schultz, MD;  Location: [facility] ENDOSCOPY;  Service: Cardiovascular;  Laterality: N/A;   VIDEO BRONCHOSCOPY WITH ENDOBRONCHIAL NAVIGATION N/A 09/01/2021   Procedure: VIDEO BRONCHOSCOPY WITH ENDOBRONCHIAL NAVIGATION;  Surgeon: Loreli Slot, MD;  Location: [facility] OR;  Service: Thoracic;  Laterality: N/A;     A IV Location/Drains/Wounds Patient Lines/Drains/Airways Status     Active Line/Drains/Airways     Name Placement date Placement time Site Days   Peripheral IV 12/31/22 18 G Right Antecubital 12/31/22  2022  Antecubital  less than 1   Peripheral IV 12/31/22 20 G Anterior;Right Forearm 12/31/22  2050  Forearm  less than 1   Peripheral IV 12/31/22 20 G Anterior;Left;Upper Arm 12/31/22  2111  Arm  less than 1            Intake/Output Last 24 hours No intake or output data in the 24 hours ending 12/31/22 2216  Labs/Imaging Results for orders placed or performed during the hospital encounter of 12/31/22 (from the past 48 hour(s))  Basic metabolic panel     Status: Abnormal   Collection Time: 12/31/22  8:12 PM  Result Value Ref Range   Sodium 137 135 - 145 mmol/L   Potassium 4.6 3.5 - 5.1 mmol/L   Chloride 102 98 - 111 mmol/L   CO2 21 (L) 22 - 32 mmol/L   Glucose, Bld 185 (H) 70 - 99 mg/dL    Comment: Glucose reference range applies only to samples taken after fasting for at least 8 hours.   BUN 69 (H) 8 - 23 mg/dL   Creatinine, Ser 1.61 (H) 0.44 - 1.00 mg/dL   Calcium 9.0 8.9 - 09.6  mg/dL   GFR, Estimated 22 (L) >60 mL/min    Comment: (NOTE) Calculated using the CKD-EPI Creatinine Equation (2021)    Anion gap 14 5 - 15    Comment: Performed at [facility], [address], [place], [place] [address]  CBC     Status: Abnormal   Collection Time: 12/31/22  8:12 PM  Result Value Ref Range   WBC 7.6 4.0 - 10.5 K/uL   RBC 2.37 (L) 3.87 - 5.11 MIL/uL   Hemoglobin 7.0 (L) 12.0 - 15.0 g/dL   HCT 98.1 (L) 19.1 - 47.8 %   MCV 99.2 80.0 - 100.0 fL   MCH 29.5 26.0 - 34.0 pg   MCHC 29.8 (L) 30.0 - 36.0 g/dL   RDW 29.5 (H) 62.1 - 30.8 %   Platelets 108 (L) 150 - 400 K/uL   nRBC 0.0 0.0 - 0.2 %    Comment: Performed at [facility], [address], [place], [place] [address]  Troponin I (High Sensitivity)     Status: Abnormal   Collection Time: 12/31/22  8:12 PM  Result Value Ref Range   Troponin I (High Sensitivity) 26 (H) <18 ng/L    Comment: (NOTE) Elevated high sensitivity troponin I (hsTnI) values and significant  changes across serial measurements may suggest ACS but many other  chronic and acute conditions are known to elevate hsTnI results.  Refer to the "Links" section for chest pain algorithms and additional  guidance. Performed at [facility], [address], [place], [place] [address]   Protime-INR (order if Patient is taking Coumadin / Warfarin)     Status: None   Collection Time: 12/31/22  8:12 PM  Result Value Ref Range   Prothrombin Time 14.6 11.4 - 15.2 seconds   INR 1.1 0.8 - 1.2    Comment: (NOTE) INR goal varies based on device and disease states. Performed at [facility], [address] [place], [place] [address]   Brain natriuretic peptide     Status: Abnormal   Collection Time: 12/31/22  8:12 PM  Result Value Ref Range   B Natriuretic Peptide 967.6 (H) 0.0 - 100.0 pg/mL    Comment: Performed at [facility], [address], [place], [place] [address]  I-Stat venous blood gas, ED     Status: Abnormal    Collection Time: 12/31/22  8:23 PM  Result Value Ref Range   pH, Ven 7.130 (LL) 7.25 - 7.43   pCO2, Ven 64.3 (H) 44 - 60 mmHg   pO2, Ven 35 32 - 45 mmHg   Bicarbonate 21.4 20.0 - 28.0 mmol/L   TCO2 23 22 - 32 mmol/L   O2 Saturation 48 %   Acid-base deficit 8.0 (H) 0.0 - 2.0 mmol/L   Sodium 142 135 - 145 mmol/L  Potassium 4.4 3.5 - 5.1 mmol/L   Calcium, Ion 1.16 1.15 - 1.40 mmol/L   HCT 23.0 (L) 36.0 - 46.0 %   Hemoglobin 7.8 (L) 12.0 - 15.0 g/dL   Sample type VENOUS    Comment NOTIFIED PHYSICIAN   Prepare RBC (crossmatch)     Status: None   Collection Time: 12/31/22  8:40 PM  Result Value Ref Range   Order Confirmation      ORDER PROCESSED BY BLOOD BANK Performed at [facility], [address] [place], [place] [address]   Type and screen Ordered by PROVIDER DEFAULT     Status: None (Preliminary result)   Collection Time: 12/31/22  9:34 PM  Result Value Ref Range   ABO/RH(D) PENDING    Antibody Screen PENDING    Sample Expiration      01/03/2023,2359 Performed at [facility], [address] [place], [place] [address]    DG Chest Portable 1 View  Result Date: 12/31/2022 CLINICAL DATA:  Shortness of breath and chest pain, initial encounter EXAM: PORTABLE CHEST 1 VIEW COMPARISON:  12/16/2022 FINDINGS: Cardiac shadow is stable. Persistent left apical mass is noted with fiducial markers in place. Lungs are well aerated. Persistent upper lobe opacities are seen which correspond to ground-glass opacities on recent CT examination. No new focal abnormality is noted. IMPRESSION: Overall stable appearance of the chest when compared with the prior exam. Electronically Signed   By: Alcide Clever M.D.   On: 12/31/2022 20:33    Pending Labs Unresulted Labs (From admission, onward)     Start     Ordered   01/02/23 0500  Heparin level (unfractionated)  Daily,   R      12/31/22 2048   01/01/23 0500  Heparin level (unfractionated)  Once-Timed,   URGENT        12/31/22 2048    01/01/23 0500  CBC  Tomorrow morning,   R        12/31/22 2207   01/01/23 0500  Basic metabolic panel  Tomorrow morning,   R        12/31/22 2207   01/01/23 0500  Magnesium  Tomorrow morning,   R        12/31/22 2207   01/01/23 0500  Phosphorus  Tomorrow morning,   R        12/31/22 2207   12/31/22 2108  Lactic acid, plasma  Now then every 2 hours,   R      12/31/22 2107   12/31/22 2025  Blood culture (routine x 2)  BLOOD CULTURE X 2,   R      12/31/22 2024   12/31/22 2024  Resp panel by RT-PCR (RSV, Flu A&B, Covid) Anterior Nasal Swab  (Asymptomatic - Covid)  Once,   URGENT        12/31/22 2024            Vitals/Pain Today's Vitals   12/31/22 2100 12/31/22 2105 12/31/22 2115 12/31/22 2145  BP: (!) 178/96 (!) 200/90 (!) 181/86 134/69  Pulse: (!) 107 (!) 106 98 86  Resp: (!) 31 (!) 28 (!) 25 (!) 22  Temp:      TempSrc:      SpO2: 100% 100% 100% 100%    Isolation Precautions No active isolations  Medications Medications  azithromycin (ZITHROMAX) 500 mg in sodium chloride 0.9 % 250 mL IVPB (500 mg Intravenous New Bag/Given 12/31/22 2137)  nitroGLYCERIN 50 mg in dextrose 5 % 250 mL (  0.2 mg/mL) infusion (0 mcg/min Intravenous Rate/Dose Change 12/31/22 2215)  0.9 %  sodium chloride infusion (Manually program via Guardrails IV Fluids) (0 mLs Intravenous Hold 12/31/22 2105)  heparin ADULT infusion 100 units/mL (25000 units/26mL) (600 Units/hr Intravenous New Bag/Given 12/31/22 2055)  docusate sodium (COLACE) capsule 100 mg (has no administration in time range)  polyethylene glycol (MIRALAX / GLYCOLAX) packet 17 g (has no administration in time range)  ipratropium-albuterol (DUONEB) 0.5-2.5 (3) MG/3ML nebulizer solution 3 mL (has no administration in time range)  ipratropium-albuterol (DUONEB) 0.5-2.5 (3) MG/3ML nebulizer solution 3 mL (has no administration in time range)  furosemide (LASIX) injection 40 mg (has no administration in time range)  aspirin EC tablet 81 mg (has no  administration in time range)  clopidogrel (PLAVIX) tablet 75 mg (has no administration in time range)  isosorbide mononitrate (IMDUR) 24 hr tablet 90 mg (has no administration in time range)  metoprolol tartrate (LOPRESSOR) tablet 25 mg (has no administration in time range)  rosuvastatin (CRESTOR) tablet 20 mg (has no administration in time range)  methylPREDNISolone sodium succinate (SOLU-MEDROL) 125 mg/2 mL injection 125 mg (125 mg Intravenous Given 12/31/22 2101)  cefTRIAXone (ROCEPHIN) 2 g in sodium chloride 0.9 % 100 mL IVPB (0 g Intravenous Stopped 12/31/22 2127)  albuterol (PROVENTIL) (2.5 MG/3ML) 0.083% nebulizer solution 5 mg (5 mg Nebulization Given 12/31/22 2035)  ipratropium (ATROVENT) nebulizer solution 0.5 mg (0.5 mg Nebulization Given 12/31/22 2035)    Mobility walks with person assist     Focused Assessments Cardiac Assessment Handoff:    Lab Results  Component Value Date   TROPONINI <0.30 08/11/2011   Lab Results  Component Value Date   DDIMER 0.76 (H) 12/29/2021   Does the Patient currently have chest pain? No   , Neuro Assessment Handoff:  Swallow screen pass? Yes          Neuro Assessment:   Neuro Checks:      Has TPA been given? No If patient is a Neuro Trauma and patient is going to OR before floor call report to 4N Charge nurse: [phone] or [phone]  , Pulmonary Assessment Handoff:  Lung sounds: Bilateral Breath Sounds: Diminished, Expiratory wheezes (faint wheezes) O2 Device: Bi-PAP      R Recommendations: See Admitting Provider Note  Report given to:   Additional Notes:

## 2023-01-01 ENCOUNTER — Inpatient Hospital Stay: Payer: Medicare Other | Admitting: Internal Medicine

## 2023-01-01 DIAGNOSIS — I214 Non-ST elevation (NSTEMI) myocardial infarction: Secondary | ICD-10-CM | POA: Diagnosis not present

## 2023-01-01 DIAGNOSIS — J9601 Acute respiratory failure with hypoxia: Secondary | ICD-10-CM | POA: Diagnosis not present

## 2023-01-01 DIAGNOSIS — I161 Hypertensive emergency: Secondary | ICD-10-CM | POA: Diagnosis not present

## 2023-01-01 LAB — BASIC METABOLIC PANEL
Anion gap: 9 (ref 5–15)
BUN: 70 mg/dL — ABNORMAL HIGH (ref 8–23)
CO2: 22 mmol/L (ref 22–32)
Calcium: 8.8 mg/dL — ABNORMAL LOW (ref 8.9–10.3)
Chloride: 106 mmol/L (ref 98–111)
Creatinine, Ser: 2.17 mg/dL — ABNORMAL HIGH (ref 0.44–1.00)
GFR, Estimated: 22 mL/min — ABNORMAL LOW (ref >60)
Glucose, Bld: 141 mg/dL — ABNORMAL HIGH (ref 70–99)
Potassium: 4.3 mmol/L (ref 3.5–5.1)
Sodium: 137 mmol/L (ref 135–145)

## 2023-01-01 LAB — PHOSPHORUS: Phosphorus: 4.4 mg/dL (ref 2.5–4.6)

## 2023-01-01 LAB — RESP PANEL BY RT-PCR (RSV, FLU A&B, COVID)  RVPGX2
Influenza A by PCR: NEGATIVE
Influenza B by PCR: NEGATIVE
Resp Syncytial Virus by PCR: NEGATIVE
SARS Coronavirus 2 by RT PCR: NEGATIVE

## 2023-01-01 LAB — MRSA NEXT GEN BY PCR, NASAL: MRSA by PCR Next Gen: NOT DETECTED

## 2023-01-01 LAB — HEPARIN LEVEL (UNFRACTIONATED): Heparin Unfractionated: 0.5 IU/mL (ref 0.30–0.70)

## 2023-01-01 LAB — CBC
HCT: 26.3 % — ABNORMAL LOW (ref 36.0–46.0)
Hemoglobin: 8.5 g/dL — ABNORMAL LOW (ref 12.0–15.0)
MCH: 29.3 pg (ref 26.0–34.0)
MCHC: 32.3 g/dL (ref 30.0–36.0)
MCV: 90.7 fL (ref 80.0–100.0)
Platelets: 89 10*3/uL — ABNORMAL LOW (ref 150–400)
RBC: 2.9 MIL/uL — ABNORMAL LOW (ref 3.87–5.11)
RDW: 15.9 % — ABNORMAL HIGH (ref 11.5–15.5)
WBC: 5.3 10*3/uL (ref 4.0–10.5)
nRBC: 0 % (ref 0.0–0.2)

## 2023-01-01 LAB — CULTURE, BLOOD (ROUTINE X 2)
Culture: NO GROWTH
Special Requests: ADEQUATE

## 2023-01-01 LAB — MAGNESIUM: Magnesium: 2.2 mg/dL (ref 1.7–2.4)

## 2023-01-01 MED ORDER — CARVEDILOL 12.5 MG PO TABS
12.5000 mg | ORAL_TABLET | Freq: Two times a day (BID) | ORAL | Status: DC
  Administered 2023-01-02 – 2023-01-10 (×14): 12.5 mg via ORAL
  Filled 2023-01-01 (×18): qty 1

## 2023-01-01 MED ORDER — CARVEDILOL 6.25 MG PO TABS
6.2500 mg | ORAL_TABLET | Freq: Two times a day (BID) | ORAL | Status: DC
  Administered 2023-01-01: 6.25 mg via ORAL
  Filled 2023-01-01: qty 1

## 2023-01-01 MED ORDER — AMLODIPINE BESYLATE 5 MG PO TABS
5.0000 mg | ORAL_TABLET | Freq: Every day | ORAL | Status: DC
  Administered 2023-01-01 – 2023-01-03 (×3): 5 mg via ORAL
  Filled 2023-01-01 (×3): qty 1

## 2023-01-01 MED ORDER — NITROGLYCERIN IN D5W 200-5 MCG/ML-% IV SOLN: 0.0000 ug/min | INTRAVENOUS | Status: DC

## 2023-01-01 MED ORDER — HYDRALAZINE HCL 20 MG/ML IJ SOLN: 10.0000 mg | INTRAMUSCULAR | Status: DC | PRN

## 2023-01-01 NOTE — Progress Notes (Addendum)
eLink Physician-Brief Progress Note Patient Name: Lisa [name] DOB: [birth year] MRN: [id number]   Date of Service  01/01/2023  HPI/Events of Note  88 year old female with a history of CKD, carotid artery stenosis, COPD, and heart failure with multiple recent hospitalizations to [facility] with conservatively managed NSTEMI's who presented to the emergency department on 7/21 with complaints of shortness of breath and chest tightness found to be in hypertensive emergency. BiPAP was initiated for increased work of breathing and initiated on nitroglycerin infusion.   Over the course of the day, nitroglycerin infusion has been turned off.  She was placed on Coreg in place of metoprolol and has been mentating well with well-controlled blood pressures  eICU Interventions  Increase the Coreg from 6.25 this morning to 12.5 this evening  Discontinue nitro drip order.  Appropriate for transfer out, will request TRH evaluation in the morning.   2217 - Add PRN hydralazine.   Intervention Category Intermediate Interventions: Hypertension - evaluation and management  Edvin Albus 01/01/2023, 8:16 PM

## 2023-01-01 NOTE — Progress Notes (Signed)
Heart Failure Navigator Progress Note  Assessed for Heart & Vascular TOC clinic readiness.  Patient does not meet criteria due to EF 55-60%, admission multifactorial, COPD, some Heart failure, and HTN emergency, CKD IV. Marland Kitchen   Navigator will sign off at this time.   Rhae Hammock, BSN, Scientist, clinical (histocompatibility and immunogenetics) Only

## 2023-01-01 NOTE — Progress Notes (Signed)
Progress Note  Patient Name: Lisa [name] Date of Encounter: 01/01/2023  Primary Cardiologist: Marjo Bicker, MD   Subjective   Patient seen examined at her bedside.  She was awake eating breakfast when I arrived.  She offers no complaints at this time.  Inpatient Medications    Scheduled Meds:  sodium chloride   Intravenous Once   aspirin EC  81 mg Oral Daily   Chlorhexidine Gluconate Cloth  6 each Topical Daily   clopidogrel  75 mg Oral Daily   ipratropium-albuterol  3 mL Nebulization Q6H   isosorbide mononitrate  90 mg Oral Daily   metoprolol tartrate  25 mg Oral BID   rosuvastatin  20 mg Oral q1800   Continuous Infusions:  heparin 600 Units/hr (01/01/23 0900)   nitroGLYCERIN 45 mcg/min (01/01/23 0900)   PRN Meds: docusate sodium, ipratropium-albuterol, polyethylene glycol   Vital Signs    Vitals:   01/01/23 0739 01/01/23 0800 01/01/23 0804 01/01/23 0830  BP:  (!) 150/61  (!) 168/86  Pulse:  63 79   Resp:  17 18 (!) 23  Temp:   98.6 F (37 C)   TempSrc:   Oral   SpO2: 97% 100% 100%   Weight:        Intake/Output Summary (Last 24 hours) at 01/01/2023 0917 Last data filed at 01/01/2023 0900 Gross per 24 hour  Intake 493.93 ml  Output 450 ml  Net 43.93 ml   Filed Weights   12/31/22 2318 01/01/23 0500  Weight: 34.2 kg 34.2 kg    Telemetry    Sinus rhythm- Personally Reviewed  ECG    None- Personally Reviewed  Physical Exam   General: Comfortable Head: Atraumatic, normal size  Eyes: PEERLA, EOMI  Neck: Supple, normal JVD Cardiac: Normal S1, S2; RRR; no murmurs, rubs, or gallops Lungs: Clear to auscultation bilaterally Abd: Soft, nontender, no hepatomegaly  Ext: warm, no edema Musculoskeletal: No deformities, BUE and BLE strength normal and equal Skin: Warm and dry, no rashes   Neuro: Alert and oriented to person, place, time, and situation, CNII-XII grossly intact, no focal deficits  Psych: Normal mood and affect   Labs     Chemistry Recent Labs  Lab 12/31/22 2012 12/31/22 2023 12/31/22 2224 01/01/23 0626  NA 137 142 139 137  K 4.6 4.4 4.6 4.3  CL 102  --   --  106  CO2 21*  --   --  22  GLUCOSE 185*  --   --  141*  BUN 69*  --   --  70*  CREATININE 2.13*  --   --  2.17*  CALCIUM 9.0  --   --  8.8*  GFRNONAA 22*  --   --  22*  ANIONGAP 14  --   --  9     Hematology Recent Labs  Lab 12/27/22 1228 12/31/22 2012 12/31/22 2023 12/31/22 2224 01/01/23 0626  WBC 8.4 7.6  --   --  5.3  RBC 2.58* 2.37*  --   --  2.90*  HGB 7.7* 7.0* 7.8* 7.5* 8.5*  HCT 24.6* 23.5* 23.0* 22.0* 26.3*  MCV 95.3 99.2  --   --  90.7  MCH 29.8 29.5  --   --  29.3  MCHC 31.3 29.8*  --   --  32.3  RDW 16.8* 16.6*  --   --  15.9*  PLT 102* 108*  --   --  89*    Cardiac EnzymesNo results for input(s): "  TROPONINI" in the last 168 hours. No results for input(s): "TROPIPOC" in the last 168 hours.   BNP Recent Labs  Lab 12/31/22 2012  BNP 967.6*     DDimer No results for input(s): "DDIMER" in the last 168 hours.   Radiology    DG Chest Portable 1 View  Result Date: 12/31/2022 CLINICAL DATA:  Shortness of breath and chest pain, initial encounter EXAM: PORTABLE CHEST 1 VIEW COMPARISON:  12/16/2022 FINDINGS: Cardiac shadow is stable. Persistent left apical mass is noted with fiducial markers in place. Lungs are well aerated. Persistent upper lobe opacities are seen which correspond to ground-glass opacities on recent CT examination. No new focal abnormality is noted. IMPRESSION: Overall stable appearance of the chest when compared with the prior exam. Electronically Signed   By: Alcide Clever M.D.   On: 12/31/2022 20:33    Cardiac Studies   Review echo from November 16, 2022 with noted wall motion abnormalities.  Patient Profile     88 y.o. female history of chronic heart failure preserved ejection fraction, hypertension, chronic kidney disease stage IV, COPD, adenocarcinoma of the lung and MGUS.Marland Kitchen  Assessment & Plan     Abnormal EKG with chest discomfort Acute on chronic heart failure with preserved ejection fraction Chronic kidney disease stage IV Acute on chronic respiratory failure with hypoxia and hypercapnia Chronic anemia   Noted to have abnormal EKG in the setting of her chest pain which is highly suspicious for multivessel disease.  Discussion with the patient earlier this admission she has declined heart catheterization which I do not think is unreasonable.  Medical management has been set in place.  She has been started on dual antiplatelet therapy, antianginals and she is also on lipid-lowering agent.  No chest pain today during my visit.  Does not appear to be volume overloaded at will continue to monitor and use cautious diuretics if needed.  She has been on the IV nitroglycerin keep this for the next 24 hours and can titrate off tomorrow.  We recommended converting from metoprolol to tartrate to carvedilol 6.25 mg twice daily and titrate up as appropriate.  Continue supportive care with BiPAP.  Please consider PRBC transfusion if hemoglobin is less than 8.        For questions or updates, please contact [facility] Please consult [email or web address] for contact info under Cardiology/STEMI.      Osvaldo Shipper, DO  01/01/2023, 9:17 AM

## 2023-01-01 NOTE — Progress Notes (Signed)
ANTICOAGULATION CONSULT NOTE - Follow Up  Pharmacy Consult for heparin Indication: chest pain/ACS  No Known Allergies  Patient Measurements: Weight: 34.2 kg (75 lb 6.4 oz) Heparin Dosing Weight: 51 kg   Vital Signs: Temp: 97.7 F (36.5 C) (07/22 0500) Temp Source: Oral (07/22 0030) BP: 157/72 (07/22 0700) Pulse Rate: 81 (07/22 0700)  Labs: Recent Labs    12/31/22 2012 12/31/22 2023 12/31/22 2218 12/31/22 2224 01/01/23 0626  HGB 7.0* 7.8*  --  7.5* 8.5*  HCT 23.5* 23.0*  --  22.0* 26.3*  PLT 108*  --   --   --  89*  LABPROT 14.6  --   --   --   --   INR 1.1  --   --   --   --   HEPARINUNFRC  --   --   --   --  0.50  CREATININE 2.13*  --   --   --  2.17*  TROPONINIHS 26*  --  1,745*  --   --     Estimated Creatinine Clearance: 10.2 mL/min (A) (by C-G formula based on SCr of 2.17 mg/dL (H)).   Medical History: Past Medical History:  Diagnosis Date   Anemia    Carotid artery stenosis    CKD (chronic kidney disease)    COPD (chronic obstructive pulmonary disease) (HCC)    Dyspnea    Due to COPD per patient   Goiter diffuse, nontoxic    Per patient   Hepatitis C    History of radiation therapy    Left lung-10/10/21-10/17/21- Dr. Antony Blackbird   Hypertension    Monoclonal gammopathy of unknown significance (MGUS)    Pyelonephritis 04/06/2014   UTI    Medications:  Scheduled:   sodium chloride   Intravenous Once   aspirin EC  81 mg Oral Daily   Chlorhexidine Gluconate Cloth  6 each Topical Daily   clopidogrel  75 mg Oral Daily   ipratropium-albuterol  3 mL Nebulization Q6H   isosorbide mononitrate  90 mg Oral Daily   metoprolol tartrate  25 mg Oral BID   rosuvastatin  20 mg Oral q1800    Assessment: 87 yof presenting with chest pain - no AC PTA (on plavix PTA). Was paged as code STEMI but declined invasive treatment.   Hgb 7, plt 108. No s/sx of bleeding.  7/22 AM: Heparin level 0.5 (therapeutic) on 600 units/hr. No signs/symptoms of bleeding  reported or issues with the infusion. Hgb 8.5 and plts 108>89  Goal of Therapy:  Heparin level 0.3-0.7 units/ml Monitor platelets by anticoagulation protocol: Yes   Plan:  Continue heparin infusion at 600 units/hr Order heparin level in 8 hours Monitor daily HL, CBC, and for s/sx of bleeding  Thank you for allowing pharmacy to participate in this patient's care,  Arabella Merles, PharmD. Clinical Pharmacist 01/01/2023 7:38 AM

## 2023-01-01 NOTE — Plan of Care (Signed)

## 2023-01-01 NOTE — Progress Notes (Signed)
Pt currently resting on 2l [place] alert and oriented. No resp distress noted at this time. No BIPAP needed at this time. Will continue to monitor.

## 2023-01-01 NOTE — TOC Initial Note (Signed)
Transition of Care [facility]) - Initial/Assessment Note    Patient Details  Name: Lisa [name] MRN: [id number] Date of Birth: [birth year]  Transition of Care [facility]) CM/SW Contact:    Elliot Cousin, RN Phone Number: [phone] 01/01/2023, 4:50 PM  Clinical Narrative:   CM spoke to pt and states she live alone. States her niece, Bronson Ing assist her in the home. Gave permission to speak to niece.    01/02/2023 1134 am Spoke to niece, Bronson Ing via phone. She has RW and RW with seat. She has medical alert system, but does not wear device. Niece are agreeable to SNF rehab or Home Health.` Reports pt has limited social interaction and help in the home.   Spoke to pt while niece on phone. Pt agreeable to SNF rehab. Waiting PT/OT evaluation.          Expected Discharge Plan: Home w Home Health Services Barriers to Discharge: Continued Medical Work up   Patient Goals and CMS Choice      Expected Discharge Plan and Services       Living arrangements for the past 2 months: Single Family Home                             HH Agency: [facility]        Prior Living Arrangements/Services Living arrangements for the past 2 months: Single Family Home Lives with:: Self Patient language and need for interpreter reviewed:: Yes        Need for Family Participation in Patient Care: No (Comment) Care giver support system in place?: Yes (comment) Current home services: DME Criminal Activity/Legal Involvement Pertinent to Current Situation/Hospitalization: No - Comment as needed  Activities of Daily Living      Permission Sought/Granted Permission sought to share information with : Family Supports Permission granted to share information with : Yes, Verbal Permission Granted  Share Information with NAME: [name]     Permission granted to share info w Relationship: neice  Permission granted to share info w Contact Information: [phone]  Emotional  Assessment Appearance:: Appears stated age Attitude/Demeanor/Rapport: Engaged Affect (typically observed): Accepting Orientation: : Oriented to Self, Oriented to Place, Oriented to  Time, Oriented to Situation   Psych Involvement: No (comment)  Admission diagnosis:  Respiratory distress [R06.03] COPD exacerbation (HCC) [J44.1] NSTEMI (non-ST elevated myocardial infarction) (HCC) [I21.4] Hypertensive emergency [I16.1] Patient Active Problem List   Diagnosis Date Noted   Hypertensive emergency 12/31/2022   Acute on chronic anemia 12/17/2022   CAD (coronary artery disease) 12/08/2022   Impaired hearing 11/24/2022   Overgrown toenails 11/24/2022   Goals of care, counseling/discussion 11/24/2022   Acute congestive heart failure (HCC) 11/18/2022   Acute on chronic heart failure with preserved ejection fraction (HCC) 11/17/2022   Acute respiratory failure with hypoxia (HCC) 11/15/2022   Acute respiratory distress 11/15/2022   Need for pneumococcal 20-valent conjugate vaccination 10/16/2022   Dysmetria 09/04/2022   Encounter for general adult medical examination with abnormal findings 09/04/2022   COVID-19 12/29/2021   COVID-19 virus infection 12/28/2021   Elevated brain natriuretic peptide (BNP) level 12/28/2021   Elevated troponin 12/28/2021   Chronic kidney disease, stage 3b (HCC) 12/28/2021   FTT (failure to thrive) in adult 12/28/2021   Underweight 12/28/2021   Pain and swelling of forearm, left 12/28/2021   COPD exacerbation (HCC) 12/27/2021   Malignant neoplasm of upper lobe of left lung (HCC) 09/15/2021  Adenocarcinoma of left lung (HCC) 09/11/2021   Multinodular goiter 09/05/2021   Odynophagia 09/03/2021   Mass of upper lobe of left lung 09/03/2021   Hypertensive urgency 09/02/2021   Chest pain 09/02/2021   CKD (chronic kidney disease) stage 4, GFR 15-29 ml/min (HCC) 09/02/2021   Tobacco abuse 09/02/2021   Myeloma (HCC) 09/02/2021   NSTEMI (non-ST elevated myocardial  infarction) [facility])    Demand ischemia    Iron deficiency anemia 07/29/2021   Anemia due to stage 4 chronic kidney disease (HCC) 07/22/2021   Chronic hepatitis C (HCC) 07/22/2021   Smoldering multiple myeloma 06/21/2021   Abnormal results of liver function studies 04/19/2021   Calculus of gallbladder without cholecystitis without obstruction    Gallstone pancreatitis 09/13/2018   Protein-calorie malnutrition, severe (HCC) 05/09/2014   Sepsis (HCC) 05/07/2014   Encephalopathy 05/07/2014   Back pain 05/07/2014   Bacteremia    COLD (chronic obstructive lung disease) (HCC)    Viridans streptococci infection    H/O goiter 04/10/2014   Moderate protein-calorie malnutrition (HCC) 04/10/2014   Acute encephalopathy 04/09/2014   Streptococcus viridans infection 04/08/2014   Leukocytosis 04/07/2014   Dehydration 04/07/2014   Fever 04/06/2014   UTI (lower urinary tract infection) 04/06/2014   ARF (acute renal failure) (HCC) 04/06/2014   Acute nontraumatic kidney injury (HCC) 04/06/2014   Hypertension    COPD (chronic obstructive pulmonary disease) (HCC)    PCP:  [name], MD Pharmacy:   [facility] [place], [place] - [address] [place] [address] Phone: [phone] Fax: [phone]     Social Determinants of Health (SDOH) Social History: SDOH Screenings   Food Insecurity: Patient Declined (12/19/2022)  Housing: Low Risk  (12/19/2022)  Transportation Needs: No Transportation Needs (12/19/2022)  Utilities: Not At Risk (12/19/2022)  Alcohol Screen: Low Risk  (10/24/2022)  Depression (PHQ2-9): Low Risk  (12/12/2022)  Financial Resource Strain: Low Risk  (10/24/2022)  Physical Activity: Inactive (10/24/2022)  Social Connections: Moderately Isolated (12/01/2022)  Stress: No Stress Concern Present (12/01/2022)  Tobacco Use: Medium Risk (12/25/2022)   SDOH Interventions:     Readmission Risk Interventions    12/17/2022    3:16 PM 11/16/2022    1:13 PM  09/06/2021    9:23 AM  Readmission Risk Prevention Plan  Transportation Screening  Complete Complete  PCP or Specialist Appt within 3-5 Days   Complete  HRI or Home Care Consult  Complete Complete  Social Work Consult for Recovery Care Planning/Counseling  Complete Complete  Palliative Care Screening  Complete Not Applicable  Medication Review Oceanographer) Complete Complete Complete  PCP or Specialist appointment within 3-5 days of discharge Complete

## 2023-01-01 NOTE — Progress Notes (Addendum)
NAME:  Lisa [name], MRN:  [id number], DOB:  [birth year], LOS: 1 ADMISSION DATE:  12/31/2022, CONSULTATION DATE:  12/31/2022 REFERRING MD: Eloise Harman - EDP, CHIEF COMPLAINT:  Dyspnea  History of Present Illness:  88 year old female who presented to [facility] ED 7/21 with shortness of breath and chest tightness; these complaints are similar to prior presentations. PMHx significant for HTN, CAD, COPD, lung CA status post radiation, CKD stage 3b, MGUS. She has 2 recent admissions to [facility] for NSTEMI and has refused cardiac catheterization on both admissions, managed medically.  On arrival to the emergency department she was noted to be profoundly hypertensive and in significant respiratory distress.  She was initiated on BiPAP for work of breathing.  EKG significant for diffuse ST changes and cardiology was consulted.  Nitroglycerin infusion was started for chest pain and blood pressure control.  PCCM was asked to admit to ICU for hypertensive emergency.  Pertinent Medical History:   has a past medical history of Anemia, Carotid artery stenosis, CKD (chronic kidney disease), COPD (chronic obstructive pulmonary disease) (HCC), Dyspnea, Goiter diffuse, nontoxic, Hepatitis C, History of radiation therapy, Hypertension, Monoclonal gammopathy of unknown significance (MGUS), and Pyelonephritis (04/06/2014).  Significant Hospital Events: Including procedures, antibiotic start and stop dates in addition to other pertinent events   7/21 - Presented to ED for SOB/chest tightness. Hypertensive. Placed on BiPAP for increased WOB and NTG started for BP/chest pain. 7/22 - Metop to Coreg per Cardiology, Amlodipine resumed. Remains on NTG gtt .  Interim History / Subjective:  Admitted overnight for hypertensive crisis Weaned off of BiPAP, now on 2L tolerating well. SBP at goal with NTG gtt downto Metoprolol transitioned to Coreg per Cardiology Amlodipine 5mg  resumed - this was discontinued in  the ED last admission, I cannot find any documentation stating any absolute contraindication and per patient she was tolerating this well prior to its discontinuation No further CP/SOB  Objective:  Blood pressure (!) 157/72, pulse 81, temperature 97.7 F (36.5 C), resp. rate 17, weight 34.2 kg, SpO2 97%.    FiO2 (%):  [15 %-100 %] 70 %   Intake/Output Summary (Last 24 hours) at 01/01/2023 0751 Last data filed at 01/01/2023 0739 Gross per 24 hour  Intake 467.75 ml  Output 450 ml  Net 17.75 ml   Filed Weights   12/31/22 2318 01/01/23 0500  Weight: 34.2 kg 34.2 kg   Physical Examination: General: Chronically ill-appearing elderly woman in NAD. Pleasant and conversant, sitting up in bed. HEENT: [place]/AT, anicteric sclera, PERRL, moist mucous membranes. Mildly HOH. Neuro: Awake, oriented x 4. Responds to verbal stimuli. Following commands consistently. Moves all 4 extremities spontaneously. CV: RRR, no m/g/r. PULM: Breathing even and unlabored on 2LNC. Lung fields CTAB. GI: Soft, nontender, nondistended. Normoactive bowel sounds. Extremities: No significant LE edema noted. Skin: Warm/dry, no rashes.  Resolved Hospital Problem List:    Assessment & Plan:   Hypertensive emergency As evidenced by MAP 150 and significant respiratory distress on presentation.  She reports taking her antihypertensive meds as prescribed. Recent discontinuation of amlodipine at last ED presentation in early July with unclear MDM related to this. Was due to discuss resumption with PCP at visit 7/22. - Remains in ICU at present due to NTG gtt - NTG gtt titrated to goal SBP, on 40mcg/hr at present - Continue home Imdur - Transition metoprolol to Coreg per Cardiology - Resume amlodipine 5mg  - Consider transition off of NTG/transfer out of ICU within the next 12-24H - Will  need hospital f/u for HTN management with PCP  Acute respiratory failure with hypoxia: Likely secondary to pulmonary edema in the setting of  above.  No history which would indicate infectious etiology.  She does have masslike consolidation on CXR but this is unchanged from previous imaging and is likely left over from her lung cancer/radiation.  Anemia may also be contributing. COPD +\-acute exacerbation - Supplemental O2 support for sat > 88% - BiPAP PRN - DuoNebs scheduled, albuterol PRN - No role for further steroids/abx at present, does not appear to be AECOPD - Pulmonary hygiene  CAD: 2 recent admissions for NSTEMI, declined invasive workup at that time. Chest pain: EKG with diffuse ST changes and troponin very minimally elevated. Acute on chronic HFpEF - Cardiology following, appreciate recs - Medical management, recommendation for transition from metoprolol to Coreg - Continue ASA/Plavix, statin - Can likely discontinue heparin gtt, doubt true ACS - Hold home Ranexa for now, resume as appropriate - Cardiac monitoring/tele  Anemia - Hemoglobin 7 Thrombocytopenia Received 1U PRBCs 7/21PM. - Trend H&H, stable post-transfusion - Monitor for signs of active bleeding - Transfuse for Hgb < 8.0 (in the setting of CAD) or hemodynamically significant bleeding - Consider heparin gtt discontinuation  AKI on CKD stage 3b - Trend BMP - Replete electrolytes as indicated - Monitor I&Os - Avoid nephrotoxic agents as able - Ensure adequate renal perfusion  Best Practice (right click and "Reselect all SmartList Selections" daily)   Diet/type: Regular consistency (see orders) DVT prophylaxis: systemic heparin GI prophylaxis: N/A Lines: N/A Foley:  N/A Code Status:  limited Last date of multidisciplinary goals of care discussion [ Discussed GOC with pt and family present in ED. Ok for intubation if necessary. No CPR.]  Critical care time:   The patient is critically ill with multiple organ system failure and requires high complexity decision making for assessment and support, frequent evaluation and titration of therapies,  advanced monitoring, review of radiographic studies and interpretation of complex data.   Critical Care Time devoted to patient care services, exclusive of separately billable procedures, described in this note is 34 minutes.  Tim Lair, PA-C [facility] 01/01/23 7:51 AM  Please see [email or web address] for pager details.  From 7A-7P if no response, please call [phone] After hours, please call ELink [phone]

## 2023-01-02 ENCOUNTER — Other Ambulatory Visit: Payer: Self-pay

## 2023-01-02 DIAGNOSIS — J441 Chronic obstructive pulmonary disease with (acute) exacerbation: Secondary | ICD-10-CM | POA: Diagnosis not present

## 2023-01-02 DIAGNOSIS — I214 Non-ST elevation (NSTEMI) myocardial infarction: Secondary | ICD-10-CM

## 2023-01-02 DIAGNOSIS — J9601 Acute respiratory failure with hypoxia: Secondary | ICD-10-CM | POA: Diagnosis not present

## 2023-01-02 DIAGNOSIS — I161 Hypertensive emergency: Secondary | ICD-10-CM | POA: Diagnosis not present

## 2023-01-02 LAB — PHOSPHORUS: Phosphorus: 4.3 mg/dL (ref 2.5–4.6)

## 2023-01-02 LAB — CBC
HCT: 21.4 % — ABNORMAL LOW (ref 36.0–46.0)
Hemoglobin: 6.9 g/dL — CL (ref 12.0–15.0)
MCH: 30.4 pg (ref 26.0–34.0)
MCHC: 32.2 g/dL (ref 30.0–36.0)
MCV: 94.3 fL (ref 80.0–100.0)
Platelets: 83 10*3/uL — ABNORMAL LOW (ref 150–400)
RBC: 2.27 MIL/uL — ABNORMAL LOW (ref 3.87–5.11)
RDW: 16.3 % — ABNORMAL HIGH (ref 11.5–15.5)
WBC: 8 10*3/uL (ref 4.0–10.5)
nRBC: 0 % (ref 0.0–0.2)

## 2023-01-02 LAB — BASIC METABOLIC PANEL
Anion gap: 7 (ref 5–15)
BUN: 77 mg/dL — ABNORMAL HIGH (ref 8–23)
CO2: 25 mmol/L (ref 22–32)
Calcium: 8.4 mg/dL — ABNORMAL LOW (ref 8.9–10.3)
Chloride: 104 mmol/L (ref 98–111)
Creatinine, Ser: 2.04 mg/dL — ABNORMAL HIGH (ref 0.44–1.00)
GFR, Estimated: 23 mL/min — ABNORMAL LOW (ref >60)
Glucose, Bld: 111 mg/dL — ABNORMAL HIGH (ref 70–99)
Potassium: 4.7 mmol/L (ref 3.5–5.1)
Sodium: 136 mmol/L (ref 135–145)

## 2023-01-02 LAB — CULTURE, BLOOD (ROUTINE X 2): Culture: NO GROWTH

## 2023-01-02 LAB — HEMOGLOBIN AND HEMATOCRIT, BLOOD
HCT: 27.7 % — ABNORMAL LOW (ref 36.0–46.0)
Hemoglobin: 8.9 g/dL — ABNORMAL LOW (ref 12.0–15.0)

## 2023-01-02 LAB — HEPARIN LEVEL (UNFRACTIONATED): Heparin Unfractionated: 0.35 IU/mL (ref 0.30–0.70)

## 2023-01-02 LAB — BPAM RBC
Blood Product Expiration Date: 202408252359
ISSUE DATE / TIME: 202407231020
Unit Type and Rh: 5100

## 2023-01-02 LAB — PREPARE RBC (CROSSMATCH)

## 2023-01-02 LAB — MAGNESIUM: Magnesium: 2 mg/dL (ref 1.7–2.4)

## 2023-01-02 MED ORDER — SODIUM CHLORIDE 0.9% IV SOLUTION: Freq: Once | INTRAVENOUS | Status: AC

## 2023-01-02 MED ORDER — NITROGLYCERIN 0.4 MG SL SUBL
SUBLINGUAL_TABLET | SUBLINGUAL | Status: AC
  Filled 2023-01-02: qty 1

## 2023-01-02 MED ORDER — NITROGLYCERIN 0.4 MG SL SUBL
0.4000 mg | SUBLINGUAL_TABLET | SUBLINGUAL | Status: DC | PRN
  Administered 2023-01-02 (×2): 0.4 mg via SUBLINGUAL

## 2023-01-02 MED ORDER — RANOLAZINE ER 500 MG PO TB12
500.0000 mg | ORAL_TABLET | Freq: Two times a day (BID) | ORAL | Status: DC
  Administered 2023-01-02 – 2023-01-10 (×17): 500 mg via ORAL
  Filled 2023-01-02 (×17): qty 1

## 2023-01-02 NOTE — TOC Progression Note (Addendum)
Transition of Care [facility]) - Progression Note    Patient Details  Name: Lisa [name] MRN: [id number] Date of Birth: 1935-03-05  Transition of Care [facility]) CM/SW Contact  Lisa Cousin, RN Phone Number: [phone] 01/02/2023, 3:51 PM  Clinical Narrative:  TOC CM spoke with pt, niece and brother at bedside. Pt and family had questions about HCPOA and Durable POA. Pt states her niece, Lisa [name] was researching attorneys to assist with completing a will. Explained the attorney can also assist with HC/Durable POA paperwork. Pt reports she owns her home and did not want circumstances to end family did not have decision making rights in her care and finances if she was unable to make decisions. Waiting PT/OT evaluation. Pt is agreeable to SNF rehab at dc as she lives alone. She does not have a 24 hour caregiver in the home. Updated family her CM/CSW on the Unit with assist with placement. Handoff give Unit CM, Deborah.   Received notification from [facility], pt is active.     Expected Discharge Plan: Home w Home Health Services Barriers to Discharge: Continued Medical Work up  Expected Discharge Plan and Services       Living arrangements for the past 2 months: Single Family Home                             HH Agency: [facility]         Social Determinants of Health (SDOH) Interventions SDOH Screenings   Food Insecurity: Patient Declined (12/19/2022)  Housing: Low Risk  (12/19/2022)  Transportation Needs: No Transportation Needs (12/19/2022)  Utilities: Not At Risk (12/19/2022)  Alcohol Screen: Low Risk  (10/24/2022)  Depression (PHQ2-9): Low Risk  (12/12/2022)  Financial Resource Strain: Low Risk  (10/24/2022)  Physical Activity: Inactive (10/24/2022)  Social Connections: Moderately Isolated (12/01/2022)  Stress: No Stress Concern Present (12/01/2022)  Tobacco Use: Medium Risk (12/25/2022)    Readmission Risk Interventions    12/17/2022    3:16 PM 11/16/2022    1:13 PM  09/06/2021    9:23 AM  Readmission Risk Prevention Plan  Transportation Screening  Complete Complete  PCP or Specialist Appt within 3-5 Days   Complete  HRI or Home Care Consult  Complete Complete  Social Work Consult for Recovery Care Planning/Counseling  Complete Complete  Palliative Care Screening  Complete Not Applicable  Medication Review Oceanographer) Complete Complete Complete  PCP or Specialist appointment within 3-5 days of discharge Complete

## 2023-01-02 NOTE — Progress Notes (Signed)
[facility]                                                                               Lisa [name], is a 88 y.o. female, DOB - September 04, 1934, [id number] Admit date - 12/31/2022    Outpatient Primary MD for the patient is Lisa Lade, MD  LOS - 2  days    Brief summary   88 year old female  with PMHx significant for HTN, CAD, COPD, lung CA status post radiation, CKD stage 3b, MGUS presents with sob and chest tightness. She has 2 recent admissions to [facility] for NSTEMI and has refused cardiac catheterization on both admissions, managed medically. She was initiated on BiPAP for work of breathing. EKG significant for diffuse ST changes and cardiology was consulted. Nitroglycerin infusion was started for chest pain and blood pressure control. PCCM was asked to admit to ICU for hypertensive emergency.    Assessment & Plan    Assessment and Plan:   Hypertensive Emergency:  Resolved.  Weaned off nitroglycerin infusion.  Continue with amlodipine and coreg.   Acute hypoxic respiratory failure in the setting of acute pulmonary edema Acute on chronic HFpEF Diuresed appropriately.  Continue with strict intake and output.  Daily weights.   NSTEMI;  EKG with st & t wave changes.  Elevated troponins.  Patient refused cath, medical management recommended.  Continue with aspirin, plavix, coreg 12.5 mg BID, Imdur and crestor.  Ranexa added for intermittent chest pain.  Cardiology on board and appreciate recommendations.  Anemia of acute illness:  Hemoglobin of 6.9 today, s/p  1unit of prbc transfusion.  Stopped IV Heparin. 1 unit of prbc transfusion.  Recheck H&h.  No obvious signs of bleeding.    Acute on stage 4 CKD:  Improving creatinine.   Acute copd exacerbation  Continue with duonebs.  [place] oxygen to keep sats greater than 90%.    Thrombocytopenia:  Will order peripheral smear.   Code Status: DNR DVT Prophylaxis:     Level of  Care: Level of care: Telemetry Cardiac Family Communication: None at bedside.   Disposition Plan:     Remains inpatient appropriate:   Procedures:  Echo.   Consultants:   Cardiology PCCM  Antimicrobials:   Anti-infectives (From admission, onward)    Start     Dose/Rate Route Frequency Ordered Stop   12/31/22 2030  cefTRIAXone (ROCEPHIN) 2 g in sodium chloride 0.9 % 100 mL IVPB        2 g 200 mL/hr over 30 Minutes Intravenous  Once 12/31/22 2024 12/31/22 2127   12/31/22 2030  azithromycin (ZITHROMAX) 500 mg in sodium chloride 0.9 % 250 mL IVPB        500 mg 250 mL/hr over 60 Minutes Intravenous  Once 12/31/22 2024 12/31/22 2237        Medications  Scheduled Meds:  sodium chloride   Intravenous Once   amLODipine  5 mg Oral Daily   aspirin EC  81 mg Oral Daily   carvedilol  12.5 mg Oral BID WC   Chlorhexidine Gluconate Cloth  6 each Topical Daily   clopidogrel  75 mg Oral Daily   isosorbide mononitrate  90 mg Oral Daily   rosuvastatin  20 mg Oral q1800   Continuous Infusions: PRN Meds:.docusate sodium, hydrALAZINE, ipratropium-albuterol, polyethylene glycol    Subjective:   Lisa [name] was seen and examined today.  Intermittent chest pain.   Objective:   Vitals:   01/01/23 2230 01/01/23 2300 01/02/23 0523 01/02/23 0745  BP: (!) 171/61 (!) 148/58 (!) 147/64 (!) 149/62  Pulse: 75 76 71 76  Resp: (!) 28 (!) 21 18 20   Temp:  98 F (36.7 C) 97.9 F (36.6 C) 97.9 F (36.6 C)  TempSrc:  Oral Oral Oral  SpO2: 100% 100% 100% 98%  Weight:  46.8 kg 45.8 kg     Intake/Output Summary (Last 24 hours) at 01/02/2023 0947 Last data filed at 01/01/2023 2216 Gross per 24 hour  Intake 218.8 ml  Output 450 ml  Net -231.2 ml   Filed Weights   01/01/23 0500 01/01/23 2300 01/02/23 0523  Weight: 34.2 kg 46.8 kg 45.8 kg     Exam General exam: Appears calm and comfortable  Respiratory system: Clear to auscultation. Respiratory effort normal. Cardiovascular  system: S1 & S2 heard, RRR. No JVD, Gastrointestinal system: Abdomen is nondistended, soft and nontender.  Central nervous system: Alert and oriented. Extremities: Symmetric 5 x 5 power. Skin: No rashes,  Psychiatry:  Mood & affect appropriate.    Data Reviewed:  I have personally reviewed following labs and imaging studies   CBC Lab Results  Component Value Date   WBC 8.0 01/02/2023   RBC 2.27 (L) 01/02/2023   HGB 6.9 (LL) 01/02/2023   HCT 21.4 (L) 01/02/2023   MCV 94.3 01/02/2023   MCH 30.4 01/02/2023   PLT 83 (L) 01/02/2023   MCHC 32.2 01/02/2023   RDW 16.3 (H) 01/02/2023   LYMPHSABS 0.7 12/27/2022   MONOABS 0.6 12/27/2022   EOSABS 0.0 12/27/2022   BASOSABS 0.0 12/27/2022     Last metabolic panel Lab Results  Component Value Date   NA 136 01/02/2023   K 4.7 01/02/2023   CL 104 01/02/2023   CO2 25 01/02/2023   BUN 77 (H) 01/02/2023   CREATININE 2.04 (H) 01/02/2023   GLUCOSE 111 (H) 01/02/2023   GFRNONAA 23 (L) 01/02/2023   GFRAA 53 (L) 09/17/2018   CALCIUM 8.4 (L) 01/02/2023   PHOS 4.3 01/02/2023   PROT 9.0 (H) 12/16/2022   ALBUMIN 3.1 (L) 12/16/2022   LABGLOB 4.1 (H) 11/29/2022   AGRATIO 0.8 11/29/2022   BILITOT 0.3 12/16/2022   ALKPHOS 65 12/16/2022   AST 40 12/16/2022   ALT 29 12/16/2022   ANIONGAP 7 01/02/2023    CBG (last 3)  Recent Labs    12/31/22 2255  GLUCAP 161*      Coagulation Profile: Recent Labs  Lab 12/31/22 2012  INR 1.1     Radiology Studies: DG Chest Portable 1 View  Result Date: 12/31/2022 CLINICAL DATA:  Shortness of breath and chest pain, initial encounter EXAM: PORTABLE CHEST 1 VIEW COMPARISON:  12/16/2022 FINDINGS: Cardiac shadow is stable. Persistent left apical mass is noted with fiducial markers in place. Lungs are well aerated. Persistent upper lobe opacities are seen which correspond to ground-glass opacities on recent CT examination. No new focal abnormality is noted. IMPRESSION: Overall stable appearance of the  chest when compared with the prior exam. Electronically Signed   By: Alcide Clever M.D.   On: 12/31/2022 20:33       Kathlen Mody M.D. [facility] 01/02/2023, 9:47 AM  Available via [employer]  secure chat 7am-7pm After 7 pm, please refer to night coverage provider listed on amion.

## 2023-01-02 NOTE — Plan of Care (Signed)
  Problem: Education: Goal: Knowledge of General Education information will improve Description: Including pain rating scale, medication(s)/side effects and non-pharmacologic comfort measures Outcome: Progressing   Problem: Clinical Measurements: Goal: Respiratory complications will improve Outcome: Progressing   Problem: Activity: Goal: Risk for activity intolerance will decrease Outcome: Progressing   Problem: Elimination: Goal: Will not experience complications related to urinary retention Outcome: Progressing   Problem: Safety: Goal: Ability to remain free from injury will improve Outcome: Progressing

## 2023-01-02 NOTE — Progress Notes (Signed)
PT Cancellation Note  Patient Details Name: Lisa [name] MRN: [id number] DOB: Oct 05, 1934   Cancelled Treatment:    Reason Eval/Treat Not Completed: Other (comment) RN requesting hold until blood transfusion is finished, will follow up if time/schedule allow   Nedra Hai, PT, DPT 01/02/23 12:50 PM

## 2023-01-02 NOTE — Progress Notes (Addendum)
Rounding Note    Patient Name: Lisa [name] Date of Encounter: 01/02/2023  [facility] Cardiologist: Marjo Bicker, MD   Subjective   Sleeping. No complaints  Inpatient Medications    Scheduled Meds:  sodium chloride   Intravenous Once   amLODipine  5 mg Oral Daily   aspirin EC  81 mg Oral Daily   carvedilol  12.5 mg Oral BID WC   clopidogrel  75 mg Oral Daily   isosorbide mononitrate  90 mg Oral Daily   rosuvastatin  20 mg Oral q1800   Continuous Infusions:   PRN Meds: docusate sodium, hydrALAZINE, ipratropium-albuterol, nitroGLYCERIN, polyethylene glycol   Vital Signs    Vitals:   01/02/23 1009 01/02/23 1017 01/02/23 1022 01/02/23 1038  BP:  (!) 147/41 (!) 158/69   Pulse:   87 74  Resp:  20 18 17   Temp:   97.9 F (36.6 C)   TempSrc:   Oral   SpO2: 100%  100% 100%  Weight:        Intake/Output Summary (Last 24 hours) at 01/02/2023 1041 Last data filed at 01/02/2023 1026 Gross per 24 hour  Intake 517.97 ml  Output 450 ml  Net 67.97 ml      01/02/2023    5:23 AM 01/01/2023   11:00 PM 01/01/2023    5:00 AM  Last 3 Weights  Weight (lbs) 101 lb 103 lb 1.6 oz 75 lb 6.4 oz  Weight (kg) 45.813 kg 46.766 kg 34.2 kg      Telemetry    Normal Sinus HR 60s-70s  - Personally Reviewed  ECG    No new tracing  Physical Exam   GEN: No acute distress.   Neck: No JVD Cardiac: RRR, no murmurs, rubs, or gallops.  Respiratory: Diminished in bases GI: Soft, nontender, non-distended  MS: No edema; No deformity. Neuro:  Nonfocal  Psych: Normal affect   Labs    High Sensitivity Troponin:   Recent Labs  Lab 12/11/22 0942 12/16/22 0628 12/16/22 0837 12/31/22 2012 12/31/22 2218  TROPONINIHS 36* 56* 2,115* 26* 1,745*     Chemistry Recent Labs  Lab 12/31/22 2012 12/31/22 2023 12/31/22 2224 01/01/23 0626 01/02/23 0128  NA 137   < > 139 137 136  K 4.6   < > 4.6 4.3 4.7  CL 102  --   --  106 104  CO2 21*  --   --  22 25   GLUCOSE 185*  --   --  141* 111*  BUN 69*  --   --  70* 77*  CREATININE 2.13*  --   --  2.17* 2.04*  CALCIUM 9.0  --   --  8.8* 8.4*  MG  --   --   --  2.2 2.0  GFRNONAA 22*  --   --  22* 23*  ANIONGAP 14  --   --  9 7   < > = values in this interval not displayed.    Hematology Recent Labs  Lab 12/31/22 2012 12/31/22 2023 12/31/22 2224 01/01/23 0626 01/02/23 0128  WBC 7.6  --   --  5.3 8.0  RBC 2.37*  --   --  2.90* 2.27*  HGB 7.0*   < > 7.5* 8.5* 6.9*  HCT 23.5*   < > 22.0* 26.3* 21.4*  MCV 99.2  --   --  90.7 94.3  MCH 29.5  --   --  29.3 30.4  MCHC 29.8*  --   --  32.3 32.2  RDW 16.6*  --   --  15.9* 16.3*  PLT 108*  --   --  89* 83*   < > = values in this interval not displayed.   BNP Recent Labs  Lab 12/31/22 2012  BNP 967.6*     Radiology    DG Chest Portable 1 View  Result Date: 12/31/2022 CLINICAL DATA:  Shortness of breath and chest pain, initial encounter EXAM: PORTABLE CHEST 1 VIEW COMPARISON:  12/16/2022 FINDINGS: Cardiac shadow is stable. Persistent left apical mass is noted with fiducial markers in place. Lungs are well aerated. Persistent upper lobe opacities are seen which correspond to ground-glass opacities on recent CT examination. No new focal abnormality is noted. IMPRESSION: Overall stable appearance of the chest when compared with the prior exam. Electronically Signed   By: Alcide Clever M.D.   On: 12/31/2022 20:33    Cardiac Studies  Echo 11/16/22   1. Left ventricular ejection fraction, by estimation, is 55 to 60%. The  left ventricle has normal function. The left ventricle demonstrates  regional wall motion abnormalities (see scoring diagram/findings for  description). Left ventricular diastolic  parameters are consistent with Grade II diastolic dysfunction  (pseudonormalization).   2. Right ventricular systolic function is normal. The right ventricular  size is normal. There is severely elevated pulmonary artery systolic  pressure.   3.  Left atrial size was mildly dilated.   4. The mitral valve is abnormal. Mild mitral valve regurgitation. No  evidence of mitral stenosis. Severe mitral annular calcification.   5. The aortic valve has an indeterminant number of cusps. Aortic valve  regurgitation is not visualized. No aortic stenosis is present.   6. The inferior vena cava is dilated in size with >50% respiratory  variability, suggesting right atrial pressure of 8 mmHg.   Patient Profile     88 y.o. female chronic heart failure preserved ejection fraction, hypertension, chronic kidney disease stage IV, COPD, adenocarcinoma of the lung and MGUS.Marland Kitchen   Assessment & Plan    Chest pain NSTEMI -- Presented with chest pain and shortness of breath.  -- EKG with ST changes, troponin 1745 -- Pt declined cath, treating medically -- continue aspirin 81mg  daily, coreg 12.5mg  BID, plavix 75mg  daily, imdur 90mg  daily, crestor 20mg  daily.   HFpEF -- grade II diastolic dysfunction on echo 11/16/22 -- on 20mg  lasix daily at home, consider restarting on discharge -- euvolemic on exam   Acute on Chronic Anemia -- hgb 6.9 this AM  -- reports no frank bleeding in urine or stool -- stop IV heparin -- transfuse one unit PRBCs  Per primary CKD IV COPD Lung CA For questions or updates, please contact [facility] Please consult [email or web address] for contact info under  Signed, Thomasene Ripple, DO, Student Nurse Practitioner  01/02/2023, 10:41 AM    Patient seen and examined, note reviewed with the signed Advanced Practice Provider. I personally reviewed laboratory data, imaging studies and relevant notes. I independently examined the patient and formulated the important aspects of the plan. I have personally discussed the plan with the patient and/or family. Comments or changes to the note/plan are indicated below.  Clinically she is improving - now off nitroglycerin gtt.  Blood pressure slightly elevated but for now will recommend  continue current antihypertensive medication due to age to avoid medication induced hypotension.  Please transfuse to keep Hgb > 8 Will stop heparin gtt today.  Still complaining about some chest pain so we will add  Ranexa 500 mg twice daily.  Thomasene Ripple DO, MS [facility] Attending Cardiologist [facility]  [address] [place], [place] [address] [phone] Website: [email or web address]

## 2023-01-02 NOTE — Evaluation (Signed)
Physical Therapy Evaluation Patient Details Name: Lisa [name] MRN: [id number] DOB: 11/11/1934 Today's Date: 01/02/2023  History of Present Illness  88yo female wh presented to [facility] ED on 12/31/22 with SOB and chest tightness. Found to be in significant HTN and placed on BIPAP. EKG also with diffuse ST changes. Admitted to ICU due to hypertensive emergency. PMH anemia, carotid stenosis, CKD, COPD, hepatitis C, hx radiation therapy, HTN, MGUS  Clinical Impression    Pt received in bed, pleasant and cooperative with PT. Able to mobilize in room on room air, but suspect she did have significant SPO2 desat which recovered with seated rest/PLB. Does have fairly unsteady, ataxic gait pattern as well as general unsteadiness and very poor functional activity tolerance. Needed Min cues for safe use of RW and safe sequencing to prep for stand to sit transfer to recliner. Left up in chair with all needs met, chair alarm active this afternoon, will very much benefit from appropriate post-acute rehab after DC.         Assistance Recommended at Discharge Frequent or constant Supervision/Assistance  If plan is discharge home, recommend the following:  Can travel by private vehicle  A little help with walking and/or transfers;Assistance with cooking/housework;Assist for transportation;A little help with bathing/dressing/bathroom;Help with stairs or ramp for entrance   Yes    Equipment Recommendations Wheelchair (measurements PT);Wheelchair cushion (measurements PT)  Recommendations for Other Services       Functional Status Assessment Patient has had a recent decline in their functional status and/or demonstrates limited ability to make significant improvements in function in a reasonable and predictable amount of time     Precautions / Restrictions Precautions Precautions: Fall;Other (comment) Precaution Comments: watch O2 Restrictions Weight Bearing Restrictions: No      Mobility  Bed  Mobility Overal bed mobility: Modified Independent             General bed mobility comments: HOB mildly elevated    Transfers Overall transfer level: Needs assistance Equipment used: Rolling walker (2 wheels) Transfers: Sit to/from Stand Sit to Stand: Min guard           General transfer comment: Min guard, extra time, mildly unsteady with good awareness of hand placement/sequencing    Ambulation/Gait Ambulation/Gait assistance: Min guard Gait Distance (Feet): 10 Feet Assistive device: Rolling walker (2 wheels) Gait Pattern/deviations: Step-through pattern, Ataxic, Trendelenburg, Drifts right/left, Trunk flexed, Narrow base of support Gait velocity: decreased     General Gait Details: poor proprioception noted, general ataxia of BLEs with poor foot placement and fatigued even after short gait distance  Stairs            Wheelchair Mobility     Tilt Bed    Modified Rankin (Stroke Patients Only)       Balance Overall balance assessment: Needs assistance Sitting-balance support: Bilateral upper extremity supported, Feet supported Sitting balance-Leahy Scale: Good     Standing balance support: Bilateral upper extremity supported, Reliant on assistive device for balance Standing balance-Leahy Scale: Poor                               Pertinent Vitals/Pain Pain Assessment Pain Assessment: No/denies pain    Home Living Family/patient expects to be discharged to:: Private residence Living Arrangements: Alone Available Help at Discharge: Family;Available PRN/intermittently Type of Home: House Home Access: Stairs to enter Entrance Stairs-Rails: Can reach both;Right;Left Entrance Stairs-Number of Steps: 6STEwith rails (but rail is  not good quality) Alternate Level Stairs-Number of Steps: can stay on main level, has ramp to 2nd level Home Layout: Two level;Able to live on main level with bedroom/bathroom Home Equipment: Rolling Walker (2  wheels);Rollator (4 wheels);Wheelchair - manual;Shower seat Additional Comments: at times needs assist getting to bathroom due to fatigue/weakness, walker won't fit in bathroom ; poor activity tolerance and per family sometimes ahs trouble controlling limbs    Prior Function Prior Level of Function : Needs assist             Mobility Comments: household ambulator using RW ADLs Comments: assisted by family     Hand Dominance   Dominant Hand: Right    Extremity/Trunk Assessment   Upper Extremity Assessment Upper Extremity Assessment: Generalized weakness    Lower Extremity Assessment Lower Extremity Assessment: Generalized weakness    Cervical / Trunk Assessment Cervical / Trunk Assessment: Kyphotic  Communication   Communication: No difficulties  Cognition Arousal/Alertness: Awake/alert Behavior During Therapy: WFL for tasks assessed/performed Overall Cognitive Status: Within Functional Limits for tasks assessed                                          General Comments General comments (skin integrity, edema, etc.): Poor signal on pulse ox, suspect desat into 80s with activity on room air but recovered well with PLB/seated rest. Increased SOB and WOB with activity on RA, also resolved with rest. HR WNL    Exercises     Assessment/Plan    PT Assessment Patient needs continued PT services  PT Problem List Decreased strength;Decreased balance;Decreased mobility;Cardiopulmonary status limiting activity;Decreased knowledge of use of DME;Decreased activity tolerance;Decreased coordination;Decreased safety awareness       PT Treatment Interventions DME instruction;Functional mobility training;Balance training;Patient/family education;Gait training;Therapeutic activities;Stair training;Therapeutic exercise    PT Goals (Current goals can be found in the Care Plan section)  Acute Rehab PT Goals Patient Stated Goal: get better PT Goal Formulation: With  patient Time For Goal Achievement: 01/16/23 Potential to Achieve Goals: Fair    Frequency Min 1X/week     Co-evaluation               AM-PAC PT "6 Clicks" Mobility  Outcome Measure Help needed turning from your back to your side while in a flat bed without using bedrails?: None Help needed moving from lying on your back to sitting on the side of a flat bed without using bedrails?: None Help needed moving to and from a bed to a chair (including a wheelchair)?: A Little Help needed standing up from a chair using your arms (e.g., wheelchair or bedside chair)?: A Little Help needed to walk in hospital room?: A Lot Help needed climbing 3-5 steps with a railing? : A Lot 6 Click Score: 18    End of Session Equipment Utilized During Treatment: Gait belt Activity Tolerance: Patient tolerated treatment well Patient left: in chair;with call bell/phone within reach;with chair alarm set Nurse Communication: Mobility status PT Visit Diagnosis: Unsteadiness on feet (R26.81);Muscle weakness (generalized) (M62.81);Other abnormalities of gait and mobility (R26.89);Ataxic gait (R26.0);Difficulty in walking, not elsewhere classified (R26.2)    Time: 1610-9604 PT Time Calculation (min) (ACUTE ONLY): 31 min   Charges:   PT Evaluation $PT Eval Moderate Complexity: 1 Mod PT Treatments $Gait Training: 8-22 mins PT General Charges $$ ACUTE PT VISIT: 1 Visit        Nedra Hai,  PT, DPT 01/02/23 4:54 PM

## 2023-01-02 NOTE — Plan of Care (Signed)
  Problem: Clinical Measurements: Goal: Diagnostic test results will improve Outcome: Progressing   Problem: Clinical Measurements: Goal: Will remain free from infection Outcome: Progressing   Problem: Health Behavior/Discharge Planning: Goal: Ability to manage health-related needs will improve Outcome: Progressing   Problem: Education: Goal: Knowledge of General Education information will improve Description: Including pain rating scale, medication(s)/side effects and non-pharmacologic comfort measures Outcome: Progressing   Problem: Health Behavior/Discharge Planning: Goal: Ability to manage health-related needs will improve Outcome: Progressing   Problem: Clinical Measurements: Goal: Will remain free from infection Outcome: Progressing Goal: Diagnostic test results will improve Outcome: Progressing

## 2023-01-02 NOTE — Progress Notes (Signed)
   01/02/23 0330  BiPAP/CPAP/SIPAP  BiPAP/CPAP/SIPAP Pt Type Adult  Reason BIPAP/CPAP not in use  (Bipap prn and not indicated at this time, no machine at bedside)

## 2023-01-02 NOTE — Progress Notes (Signed)
OT Cancellation Note  Patient Details Name: Lisa [name] MRN: [id number] DOB: [birth year]   Cancelled Treatment:    Reason Eval/Treat Not Completed: Medical issues which prohibited therapy-Pt receiving PRBCs and RN requests OT to hold.   Theodoro Clock 01/02/2023, 1:48 PM

## 2023-01-03 ENCOUNTER — Encounter: Payer: Self-pay | Admitting: *Deleted

## 2023-01-03 ENCOUNTER — Ambulatory Visit: Payer: Self-pay | Admitting: *Deleted

## 2023-01-03 DIAGNOSIS — J9601 Acute respiratory failure with hypoxia: Secondary | ICD-10-CM | POA: Diagnosis not present

## 2023-01-03 DIAGNOSIS — I161 Hypertensive emergency: Secondary | ICD-10-CM | POA: Diagnosis not present

## 2023-01-03 DIAGNOSIS — J441 Chronic obstructive pulmonary disease with (acute) exacerbation: Secondary | ICD-10-CM | POA: Diagnosis not present

## 2023-01-03 LAB — CBC
HCT: 25.2 % — ABNORMAL LOW (ref 36.0–46.0)
Hemoglobin: 8.1 g/dL — ABNORMAL LOW (ref 12.0–15.0)
MCH: 29.2 pg (ref 26.0–34.0)
MCHC: 32.1 g/dL (ref 30.0–36.0)
MCV: 91 fL (ref 80.0–100.0)
Platelets: 86 10*3/uL — ABNORMAL LOW (ref 150–400)
RBC: 2.77 MIL/uL — ABNORMAL LOW (ref 3.87–5.11)
RDW: 17.4 % — ABNORMAL HIGH (ref 11.5–15.5)
WBC: 5.6 10*3/uL (ref 4.0–10.5)
nRBC: 0 % (ref 0.0–0.2)

## 2023-01-03 LAB — BASIC METABOLIC PANEL
Anion gap: 5 (ref 5–15)
BUN: 70 mg/dL — ABNORMAL HIGH (ref 8–23)
CO2: 24 mmol/L (ref 22–32)
Calcium: 8.5 mg/dL — ABNORMAL LOW (ref 8.9–10.3)
Chloride: 107 mmol/L (ref 98–111)
Creatinine, Ser: 1.81 mg/dL — ABNORMAL HIGH (ref 0.44–1.00)
GFR, Estimated: 27 mL/min — ABNORMAL LOW (ref >60)
Glucose, Bld: 80 mg/dL (ref 70–99)
Potassium: 4.5 mmol/L (ref 3.5–5.1)
Sodium: 136 mmol/L (ref 135–145)

## 2023-01-03 LAB — TYPE AND SCREEN
ABO/RH(D): O POS
Antibody Screen: NEGATIVE
Unit division: 0
Unit division: 0

## 2023-01-03 LAB — CULTURE, BLOOD (ROUTINE X 2)

## 2023-01-03 LAB — BPAM RBC
Blood Product Expiration Date: 202408242359
ISSUE DATE / TIME: 202407220012
Unit Type and Rh: 5100

## 2023-01-03 LAB — HEMOGLOBIN A1C
Hgb A1c MFr Bld: 5.6 % (ref 4.8–5.6)
Mean Plasma Glucose: 114.02 mg/dL

## 2023-01-03 MED ORDER — AMLODIPINE BESYLATE 10 MG PO TABS
10.0000 mg | ORAL_TABLET | Freq: Every day | ORAL | Status: DC
  Administered 2023-01-04 – 2023-01-10 (×7): 10 mg via ORAL
  Filled 2023-01-03 (×7): qty 1

## 2023-01-03 NOTE — Patient Instructions (Signed)
Visit Information  Thank you for taking time to visit with me today. Please don't hesitate to contact me if I can be of assistance to you.   Following are the goals we discussed today:   Goals Addressed             This Visit's Progress    Find Help in My Community.   On track    Care Coordination Interventions:  Interventions Today    Flowsheet Row Most Recent Value  Chronic Disease   Chronic disease during today's visit Chronic Obstructive Pulmonary Disease (COPD), Chronic Kidney Disease/End Stage Renal Disease (ESRD), Hypertension (HTN), Other  [Tobacco Abuse]  General Interventions   General Interventions Discussed/Reviewed --  [Encouraged]  Labs Hgb A1c annually  [Encouraged]  Vaccines COVID-19, Flu, Pneumonia, RSV, Shingles, Tetanus/Pertussis/Diphtheria  [Encouraged]  Doctor Visits Discussed/Reviewed Doctor Visits Discussed, Doctor Visits Reviewed, Annual Wellness Visits, PCP, Specialist  [Encouraged]  Health Screening Bone Density, Colonoscopy, Mammogram  [Encouraged]  PCP/Specialist Visits Compliance with follow-up visit  [Encouraged]  Communication with PCP/Specialists  [Encouraged]  Level of Care Adult Daycare, Personal Care Services, Applications, Assisted Living, Skilled Nursing Facility  [Encouraged]  Applications Medicaid, Personal Care Services, FL-2  [Encouraged]  Exercise Interventions   Exercise Discussed/Reviewed Exercise Discussed, Assistive device use and maintanence, Exercise Reviewed, Physical Activity, Weight Managment  [Encouraged]  Physical Activity Discussed/Reviewed Physical Activity Discussed, Physical Activity Reviewed, Home Exercise Program (HEP), Types of exercise  [Encouraged]  Weight Management Weight maintenance  [Encouraged]  Education Interventions   Education Provided Provided Therapist, sports, Provided Web-based Education, Provided Education  Provided Verbal Education On Nutrition, Mental Health/Coping with Illness, When to see the doctor,  Foot Care, Eye Care, Applications, Labs, Exercise, Medication, Blood Sugar Monitoring, Programmer, applications, Nurse, mental health, Personal Care Services, FL-2  [Encouraged]  Mental Health Interventions   Mental Health Discussed/Reviewed Mental Health Discussed, Anxiety, Depression, Grief and Loss, Mental Health Reviewed, Substance Abuse, Coping Strategies, Crisis, Other, Suicide  [Domestic Violence]  Nutrition Interventions   Nutrition Discussed/Reviewed Nutrition Discussed, Adding fruits and vegetables, Increaing proteins, Decreasing fats, Decreasing salt, Supplmental nutrition, Decreasing sugar intake, Portion sizes, Fluid intake, Nutrition Reviewed, Carbohydrate meal planning  [Encouraged]  Pharmacy Interventions   Pharmacy Dicussed/Reviewed Pharmacy Topics Discussed, Medications and their functions, Medication Adherence, Pharmacy Topics Reviewed, Affording Medications  [Encouraged]  Safety Interventions   Safety Discussed/Reviewed Safety Discussed, Safety Reviewed, Home Safety  [Encouraged]  Home Safety Assistive Devices, Need for home safety assessment, Refer for community resources  [Encouraged]  Advanced Directive Interventions   Advanced Directives Discussed/Reviewed Advanced Directives Discussed  [Advanced Directives Completed]      Active Listening & Reflection Utilized.  Verbalization of Feelings Encouraged.  Emotional Support Provided. Feelings of Caregiver Burnout & Fatigue Validated. Symptoms of Caregiver Stress & Anxiety Acknowledged. Problem Solving Interventions Employed. Task-Centered Solutions Indicated.   Solution-Focused Strategies Activated. Acceptance & Commitment Therapy Performed. Cognitive Behavioral Therapy Initiated. Client-Centered Therapy Implemented. CSW Collaboration with Charlesetta Shanks, Nurse with [facility], Via In [employer] in [place], to Discuss Patient's  Admission to [facility] for Treatment of Hypertensive Urgency, Blood Loss Anemia & Blood Transfusion.  CSW Collaboration with Niece, [name] to Confirm Interest in Finding An Attorney to Assist with Patient's Living Will & [employer].  CSW Collaboration with Niece, [name] to Confirm Interest in Patient Being Placed into A Skilled Nursing Facility to [employer], Upon Medical Clearance & Discharge from [facility].  CSW Collaboration with Niece, [name] to Confirm Interest in Scheduling a Multidisciplinary Team Meeting, to Include Patient, Primary Care Provider, Dr. Christel Mormon, RNCM, Edd Arbour, & CSW, All with [facility] Primary [facility] [phone]). CSW Collaboration with Edd Arbour, RNCM with [facility] [phone]), Via Secure Chat Message in [place], to Confirm Availability for Multidisciplinary Team Meeting, Scheduled on 01/08/2023 at 12:00 PM. CSW Collaboration with Dr. Christel Mormon, Primary Care Provider with [facility] Primary Care [phone]# [phone]), Via Secure Chat Message in [place], to Lemar Livings for Multidisciplinary Team Meeting, Scheduled on 01/08/2023 at 12:00 PM. CSW Collaboration with Niece, [name] to Confirm Agreement for Referral to [facility] (# [phone]) for Home Health Physical & Occupational Therapy, Jearld Pies, & Social Work Services. CSW Collaboration with Niece, [name] to [facility] Bed, through [employer] [phone]), Has Been Delivered to Patient's Home. CSW Collaboration with Niece, [name] to Confirm Palliative Care Referral Placed to [facility] [phone]). CSW Collaboration with Niece, [name] to Confirm Patient's Name on Waiting List for Meals on Wheels, through (ADTS) Aging, Disability, & Transit Services of [place] [phone]). CSW Collaboration with Niece,  [name] to [employer] with CSW [phone]# [phone]), if She Has Questions, Needs Assistance, or If Additional Social Work Needs Are Identified Between Now & Our Next Scheduled Follow-Up Outreach Call.      Our next appointment is by telephone on  01/08/2023 at 12:00 pm.   Please call the care guide team at [phone] if you need to cancel or reschedule your appointment.   If you are experiencing a Mental Health or Behavioral Health Crisis or need someone to talk to, please call the Suicide and Crisis Lifeline: 988 call the [place] National Suicide Prevention Lifeline: 780-767-8679 or TTY: 902-871-4701 TTY (684)178-1741) to talk to a trained counselor call 1-800-273-TALK (toll free, 24 hour hotline) go to [facility] [address], [place] [phone]) call the [facility] Crisis Line: (782) 473-1516 call 911  Patient verbalizes understanding of instructions and care plan provided today and agrees to view in MyChart. Active MyChart status and patient understanding of how to access instructions and care plan via MyChart confirmed with patient.     Telephone follow up appointment with care management team member scheduled for:   01/08/2023 at 12:00 pm.   Danford Bad, BSW, MSW, LCSW  Licensed Clinical Social Worker  [facility]  [place]. [address] [place], [place] [address] Physical Address-[address], [place], [place] [address] Toll Free Main # [phone] Fax # [phone] Cell # [phone] [email or web address]

## 2023-01-03 NOTE — Care Management Important Message (Signed)
Important Message  Patient Details  Name: Lisa [name] MRN: [id number] Date of Birth: 09/28/1934   Medicare Important Message Given:  Yes     Renie Ora 01/03/2023, 9:15 AM

## 2023-01-03 NOTE — Consult Note (Signed)
  [facility] CM Inpatient Consult   01/03/2023  Lisa [name] 1934/07/20 [id number]  [facility] [[facility]]  Accountable Care Organization [ACO] Patient: [employer] Medicare  Primary Care Provider:  [name], MD with [facility]    Patient is currently active with [facility] [[facility]] Care Management for chronic disease management services.  Patient was noted to being engaged by a Charity fundraiser and Child psychotherapist on behalf of [facility].   The community based plan of care has focused on disease management and community resource support.  Patient is admitted with Hypertensive Emergency and recent NSTEMI, CKD, Cancer and Chronic Anemia requiring blood transfusions noted. Patient discussed in unit based progression meeting for barriers and ongoing needs.  Met with patient at the bedside.  She states she is feeling better today.  She concurs that her niece, [name], is her main contact person and helps her with medical decisions states, "with all of the appointments and medicines, I just get mixed up because it's so much." Patient states she has all of her communication go to her niece for that reason as she did not know who her Community Care Coordination team was with [facility], "but Bronson Ing knows."  Patient was being recommended for a skilled nursing facility level of care for post hospital needs.     Plan:  Will follow for post hospital disposition and collaborate with [facility] Coordination team with updates on post hospital needs/follow up. If patient goes to a SNF affiliated with [facility] then the [facility] RN can be notified of needs and active status with Nurse, children's CC and Child psychotherapist.   Inpatient Transition Of Care [TOC] team members were made aware that [facility] Coordination team [facility] and Social Worker] for [facility] Care Management is active and following.   Of note, [facility] Care Management services does not replace or interfere with any services that are needed or  arranged by inpatient [facility] care management team.   For additional questions or referrals please contact:  Charlesetta Shanks, RN BSN CCM [facility]  [phone] business mobile phone Toll free office [phone]  *Concierge Line  [phone] Fax number: [phone] [email or web address]

## 2023-01-03 NOTE — Evaluation (Signed)
Occupational Therapy Evaluation Patient Details Name: Lisa [name] MRN: [id number] DOB: [birth year] Today's Date: 01/03/2023   History of Present Illness 88yo female wh presented to [facility] ED on 12/31/22 with SOB and chest tightness. Found to be in significant HTN and placed on BIPAP. EKG also with diffuse ST changes. Admitted to ICU due to hypertensive emergency. PMH anemia, carotid stenosis, CKD, COPD, hepatitis C, hx radiation therapy, HTN, MGUS   Clinical Impression   Pt typically lives alone and walks with a rollator. She sponge bathes on the first floor of her home. Her family assists with heavier housework. Pt presents with incoordination in all extremities, she reports this is new in her UEs and has a neurologist appointment. She requires min assist for ambulation with RW and set up to min assist for ADLs. Pt with c/o shortness of breath, SpO2 96% on 2L. She is not on O2 at baseline.  Patient will benefit from continued inpatient follow up therapy, <3 hours/day.      Recommendations for follow up therapy are one component of a multi-disciplinary discharge planning process, led by the attending physician.  Recommendations may be updated based on patient status, additional functional criteria and insurance authorization.   Assistance Recommended at Discharge Frequent or constant Supervision/Assistance  Patient can return home with the following A little help with walking and/or transfers;A little help with bathing/dressing/bathroom;Assistance with cooking/housework;Assist for transportation;Help with stairs or ramp for entrance;Direct supervision/assist for medications management;Direct supervision/assist for financial management    Functional Status Assessment  Patient has had a recent decline in their functional status and demonstrates the ability to make significant improvements in function in a reasonable and predictable amount of time.  Equipment Recommendations  None recommended by  OT    Recommendations for Other Services       Precautions / Restrictions Precautions Precautions: Fall;Other (comment) Precaution Comments: watch O2 Restrictions Weight Bearing Restrictions: No      Mobility Bed Mobility Overal bed mobility: Modified Independent             General bed mobility comments: returned to supine    Transfers Overall transfer level: Needs assistance Equipment used: Rolling walker (2 wheels) Transfers: Sit to/from Stand Sit to Stand: Min guard           General transfer comment: cues for hand placement      Balance Overall balance assessment: Needs assistance   Sitting balance-Leahy Scale: Good     Standing balance support: Bilateral upper extremity supported, Reliant on assistive device for balance Standing balance-Leahy Scale: Poor                             ADL either performed or assessed with clinical judgement   ADL Overall ADL's : Needs assistance/impaired Eating/Feeding: Set up;Sitting   Grooming: Sitting;Wash/dry hands;Wash/dry face;Set up   Upper Body Bathing: Minimal assistance   Lower Body Bathing: Minimal assistance;Sit to/from stand   Upper Body Dressing : Minimal assistance;Sitting   Lower Body Dressing: Minimal assistance;Sit to/from stand Lower Body Dressing Details (indicate cue type and reason): can don socks Toilet Transfer: Minimal assistance;Ambulation;Rolling walker (2 wheels);BSC/3in1   Toileting- Clothing Manipulation and Hygiene: Minimal assistance;Sit to/from stand       Functional mobility during ADLs: Minimal assistance;Rolling walker (2 wheels)       Vision Baseline Vision/History: 1 Wears glasses Ability to See in Adequate Light: 0 Adequate Patient Visual Report: No change from baseline  Perception     Praxis      Pertinent Vitals/Pain Pain Assessment Pain Assessment: No/denies pain     Hand Dominance Right   Extremity/Trunk Assessment Upper Extremity  Assessment Upper Extremity Assessment: RUE deficits/detail;LUE deficits/detail RUE Deficits / Details: ataxic quality to movement RUE Coordination: decreased fine motor;decreased gross motor LUE Deficits / Details: ataxic quality to movement LUE Coordination: decreased fine motor;decreased gross motor   Lower Extremity Assessment Lower Extremity Assessment: Defer to PT evaluation   Cervical / Trunk Assessment Cervical / Trunk Assessment: Kyphotic   Communication Communication Communication: No difficulties   Cognition Arousal/Alertness: Awake/alert Behavior During Therapy: WFL for tasks assessed/performed Overall Cognitive Status: Within Functional Limits for tasks assessed                                       General Comments       Exercises     Shoulder Instructions      Home Living Family/patient expects to be discharged to:: Private residence Living Arrangements: Alone Available Help at Discharge: Family;Available PRN/intermittently Type of Home: House Home Access: Stairs to enter [employer] of Steps: 6STEwith rails (but rail is not good quality) Entrance Stairs-Rails: Can reach both;Right;Left Home Layout: Two level;Able to live on main level with bedroom/bathroom Alternate Level Stairs-Number of Steps: can stay on main level, has ramp to 2nd level   Bathroom Shower/Tub: Sponge bathes at baseline   Bathroom Toilet: Handicapped height     Home Equipment: Agricultural consultant (2 wheels);Rollator (4 wheels);Wheelchair - manual;Shower seat   Additional Comments: at times needs assist getting to bathroom due to fatigue/weakness, walker won't fit in bathroom ; poor activity tolerance and per family sometimes ahs trouble controlling limbs      Prior Functioning/Environment Prior Level of Function : Needs assist             Mobility Comments: household ambulator using rollator ADLs Comments: independent in basic ADLs, sponge bathes,  family assists with IADLs        OT Problem List: Impaired balance (sitting and/or standing);Decreased activity tolerance;Decreased coordination;Impaired UE functional use;Decreased knowledge of use of DME or AE      OT Treatment/Interventions: Self-care/ADL training;DME and/or AE instruction;Therapeutic activities;Patient/family education;Balance training    OT Goals(Current goals can be found in the care plan section) Acute Rehab OT Goals OT Goal Formulation: With patient Time For Goal Achievement: 01/17/23 Potential to Achieve Goals: Good ADL Goals Pt Will Transfer to Toilet: with supervision;ambulating;bedside commode Pt Will Perform Toileting - Clothing Manipulation and hygiene: with supervision;sit to/from stand Additional ADL Goal #1: Pt will complete basice ADLs seated at sink with set up.  OT Frequency: Min 1X/week    Co-evaluation              AM-PAC OT "6 Clicks" Daily Activity     Outcome Measure Help from another person eating meals?: A Little Help from another person taking care of personal grooming?: A Little Help from another person toileting, which includes using toliet, bedpan, or urinal?: A Little Help from another person bathing (including washing, rinsing, drying)?: A Little Help from another person to put on and taking off regular upper body clothing?: A Little Help from another person to put on and taking off regular lower body clothing?: A Little 6 Click Score: 18   End of Session Equipment Utilized During Treatment: Gait belt;Rolling walker (2 wheels)  Activity Tolerance:  Patient tolerated treatment well Patient left: in bed;with call bell/phone within reach;with bed alarm set  OT Visit Diagnosis: Unsteadiness on feet (R26.81);Other abnormalities of gait and mobility (R26.89)                Time: 2536-6440 OT Time Calculation (min): 19 min Charges:  OT General Charges $OT Visit: 1 Visit OT Evaluation $OT Eval Moderate Complexity: 1  Mod  Berna Spare, OTR/L Acute Rehabilitation Services Office: [phone]  Evern Bio 01/03/2023, 12:28 PM

## 2023-01-03 NOTE — Patient Outreach (Signed)
Care Coordination   Follow Up Visit Note   01/03/2023  Name: Lisa [name] MRN: [id number] DOB: [birth year]  Lisa [name] is a 88 y.o. year old female who sees Lisa [name], Lisa Mellow, MD for primary care. I spoke with patient's niece, Lisa [name] by phone today.  What matters to the patients health and wellness today?  Find Help in My Community.   Goals Addressed             This Visit's Progress    Find Help in My Community.   On track    Care Coordination Interventions:  Interventions Today    Flowsheet Row Most Recent Value  Chronic Disease   Chronic disease during today's visit Chronic Obstructive Pulmonary Disease (COPD), Chronic Kidney Disease/End Stage Renal Disease (ESRD), Hypertension (HTN), Other  [Tobacco Abuse]  General Interventions   General Interventions Discussed/Reviewed --  [Encouraged]  Labs Hgb A1c annually  [Encouraged]  Vaccines COVID-19, Flu, Pneumonia, RSV, Shingles, Tetanus/Pertussis/Diphtheria  [Encouraged]  Doctor Visits Discussed/Reviewed Doctor Visits Discussed, Doctor Visits Reviewed, Annual Wellness Visits, PCP, Specialist  [Encouraged]  Health Screening Bone Density, Colonoscopy, Mammogram  [Encouraged]  PCP/Specialist Visits Compliance with follow-up visit  [Encouraged]  Communication with PCP/Specialists  [Encouraged]  Level of Care Adult Daycare, Personal Care Services, Applications, Assisted Living, Skilled Nursing Facility  [Encouraged]  Applications Medicaid, Personal Care Services, [place]-2  [Encouraged]  Exercise Interventions   Exercise Discussed/Reviewed Exercise Discussed, Assistive device use and maintanence, Exercise Reviewed, Physical Activity, Weight Managment  [Encouraged]  Physical Activity Discussed/Reviewed Physical Activity Discussed, Physical Activity Reviewed, Home Exercise Program (HEP), Types of exercise  [Encouraged]  Weight Management Weight maintenance  [Encouraged]  Education Interventions   Education Provided  Provided Therapist, sports, Provided Web-based Education, Provided Education  Provided Verbal Education On Nutrition, Mental Health/Coping with Illness, When to see the doctor, Foot Care, Eye Care, Applications, Labs, Exercise, Medication, Blood Sugar Monitoring, Programmer, applications, Nurse, mental health, Personal Care Services, [place]-2  [Encouraged]  Mental Health Interventions   Mental Health Discussed/Reviewed Mental Health Discussed, Anxiety, Depression, Grief and Loss, Mental Health Reviewed, Substance Abuse, Coping Strategies, Crisis, Other, Suicide  [Domestic Violence]  Nutrition Interventions   Nutrition Discussed/Reviewed Nutrition Discussed, Adding fruits and vegetables, Increaing proteins, Decreasing fats, Decreasing salt, Supplmental nutrition, Decreasing sugar intake, Portion sizes, Fluid intake, Nutrition Reviewed, Carbohydrate meal planning  [Encouraged]  Pharmacy Interventions   Pharmacy Dicussed/Reviewed Pharmacy Topics Discussed, Medications and their functions, Medication Adherence, Pharmacy Topics Reviewed, Affording Medications  [Encouraged]  Safety Interventions   Safety Discussed/Reviewed Safety Discussed, Safety Reviewed, Home Safety  [Encouraged]  Home Safety Assistive Devices, Need for home safety assessment, Refer for community resources  [Encouraged]  Advanced Directive Interventions   Advanced Directives Discussed/Reviewed Advanced Directives Discussed  [Advanced Directives Completed]      Active Listening & Reflection Utilized.  Verbalization of Feelings Encouraged.  Emotional Support Provided. Feelings of Caregiver Burnout & Fatigue Validated. Symptoms of Caregiver Stress & Anxiety Acknowledged. Problem Solving Interventions Employed. Task-Centered Solutions Indicated.   Solution-Focused Strategies Activated. Acceptance & Commitment Therapy Performed. Cognitive Behavioral Therapy Initiated. Client-Centered Therapy  Implemented. CSW Collaboration with Lisa [name], Nurse with [facility] Lia[facility] Health, Via In [employer] in [place], to Discuss Patient's Admission to [facility] for Treatment of Hypertensive Urgency, Blood Loss Anemia & Blood Transfusion.  CSW Collaboration with Niece, Lisa [name] to Confirm Interest in Finding An Attorney to Assist with Patient's Living Will & [employer].  CSW Collaboration with Niece, Lisa [name] to Confirm Interest in Patient Being Placed into A Skilled Nursing Facility to [employer], Upon Medical Clearance & Discharge from [facility]. CSW Collaboration with Niece, Lisa [name] to Confirm Interest in Scheduling a Multidisciplinary Team Meeting, to Include Patient, Primary Care Provider, Dr. Christel [name], RNCM, Lisa [name], & CSW, All with [facility] Primary Care [phone]). CSW Collaboration with Lisa [name], RNCM with [facility] Primary Care [phone]), Via Secure Chat Message in [place], to Confirm Availability for Multidisciplinary Team Meeting, Scheduled on 01/08/2023 at 12:00 PM. CSW Collaboration with Dr. Christel [name], Primary Care Provider with [facility] Primary Care [phone]# [phone]), Via Secure Chat Message in [place], to Lemar Livings for Multidisciplinary Team Meeting, Scheduled on 01/08/2023 at 12:00 PM. CSW Collaboration with Niece, Lisa [name] to Confirm Agreement for Referral to [facility] (# [phone]) for Home Health Physical & Occupational Therapy, Lisa [name], & Social Work Services. CSW Collaboration with Niece, Lisa [name] to [facility] Bed, through [employer] [phone]), Has Been Delivered to Patient's Home. CSW Collaboration with Niece, Lisa [name] to Confirm Palliative Care Referral Placed to [facility] [phone]). CSW Collaboration with Niece, Lisa [name] to Confirm Patient's Name on Waiting List for Meals on Wheels, through ([facility]) Aging, Disability, & Transit [facility] [place] [phone]). CSW Collaboration with Niece, Lisa [name] to [employer] with CSW [phone]# [phone]), if She Has Questions, Needs Assistance, or If Additional Social Work Needs Are Identified Between Now & Our Next Scheduled Follow-Up Outreach Call.      SDOH assessments and interventions completed:  Yes.  Care Coordination Interventions:  Yes, provided.   Follow up plan: Follow up call scheduled for 01/08/2023 at 12:00 pm.   Encounter Outcome:  Pt. Visit Completed.   Danford Bad, BSW, MSW, LCSW  Licensed Restaurant manager, [employer] [facility]  Mailing [place] [address] [place], [place] [address] Physical Address-[address] [place], [place] [address] Toll Free Main # [phone] Fax # [phone] Cell # [phone] [email or web address]

## 2023-01-03 NOTE — Progress Notes (Addendum)
PROGRESS NOTE    Lisa [name]  ZOX:[id number] DOB: [birth year] DOA: 12/31/2022 PCP: [name], MD    Brief Narrative:   88 year old female  with past medical history significant for HTN, CAD, COPD, lung CA status post radiation, CKD stage 3b, MGUS presented to the hospital with shortness of breath and chest tightness.  Patient admission x 2 for non-ST elevation MI when she had refused cardiac catheterization , managed medically.  On this admission, she was initially put on BiPAP for increased work of breathing.   EKG was significant for diffuse ST changes and cardiology was consulted. Nitroglycerin infusion was started for chest pain and blood pressure control. PCCM was asked to admit to ICU for hypertensive emergency.  Patient was subsequently weaned off nitroglycerin drip and was transferred out of the ICU.  Assessment and Plan:   Hypertensive Emergency:  Resolved.  Off nitroglycerin drip.  Currently on amlodipine and Coreg. Weaned off nitroglycerin infusion.  Blood pressure seems to be reasonably controlled.  Will continue to monitor closely.     Acute hypoxic respiratory failure in the setting of acute pulmonary edema Acute on chronic HFpEF Continue strict intake and output charting.  Has received diuretics with improvement.  Currently on 3 L of oxygen by nasal cannula.  Plan to restart p.o. Lasix on discharge   NSTEMI;  EKG showed ST and T wave changes with elevated troponin.  History of refusing cath in the past and refused at this time as well.  On aspirin Plavix Coreg and Crestor.  Ranexa has been added due to intermittent chest pain.  Cardiology on board and spoke with cardiology at bedside.  Plan for outpatient cardiology follow-up.   Anemia of chronic kidney disease Patient received 1 unit of packed RBC for hemoglobin of 6.9 on 01/02/2023.  Hemoglobin today at 8.1.  No signs of bleeding.  Has mild thrombocytopenia will need to continue to monitor.  Was on heparin drip  which was discontinued.     Acute kidney injury on stage 4 CKD:  Latest creatinine at 1.8 from 2.0.  Check creatinine from today.  Continue to monitor..   Acute COPD exacerbation. Continue DuoNebs, incentive spirometry,    Thrombocytopenia:  Off heparin drip.  Continue to monitor for bleeding.  Latest platelet count of 86.  Will repeat CBC from today.  Check CBC in AM.    DVT prophylaxis: SCD.   Code Status:     Code Status: DNR  Disposition: Skilled nursing facility as per PT OT evaluation.  Status is: Inpatient  Remains inpatient appropriate because: Pending skilled nursing facility placement,   Family Communication: spoke with the patient's niece and HPOA on the phone and updated her about the clinical condition of the patient.   Consultants:  Cardiology PCCM  Procedures:  Transfusion of 1 unit of packed RBC  Antimicrobials:  None currently  Anti-infectives (From admission, onward)    Start     Dose/Rate Route Frequency Ordered Stop   12/31/22 2030  cefTRIAXone (ROCEPHIN) 2 g in sodium chloride 0.9 % 100 mL IVPB        2 g 200 mL/hr over 30 Minutes Intravenous  Once 12/31/22 2024 12/31/22 2127   12/31/22 2030  azithromycin (ZITHROMAX) 500 mg in sodium chloride 0.9 % 250 mL IVPB        500 mg 250 mL/hr over 60 Minutes Intravenous  Once 12/31/22 2024 12/31/22 2237      Subjective: Today, patient was seen and examined at bedside.  Patient denies any chest pain, fever, chills or overt dyspnea.  Breathing has gotten better.  Has generalized weakness.  Objective: Vitals:   01/03/23 0011 01/03/23 0508 01/03/23 0751 01/03/23 0926  BP: 113/75 (!) 159/57 (!) 176/71 (!) 171/49  Pulse: (!) 47 (!) 59 64   Resp: 19 19 20 20   Temp: 97.7 F (36.5 C) 97.7 F (36.5 C) 97.7 F (36.5 C)   TempSrc:  Oral Oral   SpO2: 99% 100% 100% 100%  Weight:  47.3 kg      Intake/Output Summary (Last 24 hours) at 01/03/2023 1145 Last data filed at 01/03/2023 0816 Gross per 24 hour   Intake 826 ml  Output 400 ml  Net 426 ml   Filed Weights   01/01/23 2300 01/02/23 0523 01/03/23 0508  Weight: 46.8 kg 45.8 kg 47.3 kg    Physical Examination: Body mass index is 16.32 kg/m.   General: Thinly built, not in obvious distress, on nasal cannula oxygen HENT:   Pallor noted. Oral mucosa is moist.  Chest: .  Diminished breath sounds bilaterally. No crackles or wheezes.  CVS: S1 &S2 heard. No murmur.  Regular rate and rhythm. Abdomen: Soft, nontender, nondistended.  Bowel sounds are heard.   Extremities: No cyanosis, clubbing or edema.  Peripheral pulses are palpable. Psych: Alert, awake and oriented, normal mood CNS:  No cranial nerve deficits.  Power equal in all extremities.   Skin: Warm and dry.  No rashes noted.  Data Reviewed:   CBC: Recent Labs  Lab 12/27/22 1228 12/31/22 2012 12/31/22 2023 12/31/22 2224 01/01/23 0626 01/02/23 0128 01/02/23 1726 01/02/23 2348  WBC 8.4 7.6  --   --  5.3 8.0  --  5.6  NEUTROABS 7.0  --   --   --   --   --   --   --   HGB 7.7* 7.0*   < > 7.5* 8.5* 6.9* 8.9* 8.1*  HCT 24.6* 23.5*   < > 22.0* 26.3* 21.4* 27.7* 25.2*  MCV 95.3 99.2  --   --  90.7 94.3  --  91.0  PLT 102* 108*  --   --  89* 83*  --  86*   < > = values in this interval not displayed.    Basic Metabolic Panel: Recent Labs  Lab 12/31/22 2012 12/31/22 2023 12/31/22 2224 01/01/23 0626 01/02/23 0128 01/02/23 2348  NA 137 142 139 137 136 136  K 4.6 4.4 4.6 4.3 4.7 4.5  CL 102  --   --  106 104 107  CO2 21*  --   --  22 25 24   GLUCOSE 185*  --   --  141* 111* 80  BUN 69*  --   --  70* 77* 70*  CREATININE 2.13*  --   --  2.17* 2.04* 1.81*  CALCIUM 9.0  --   --  8.8* 8.4* 8.5*  MG  --   --   --  2.2 2.0  --   PHOS  --   --   --  4.4 4.3  --     Liver Function Tests: No results for input(s): "AST", "ALT", "ALKPHOS", "BILITOT", "PROT", "ALBUMIN" in the last 168 hours.   Radiology Studies: No results found.    LOS: 3 days    Joycelyn Das,  MD [facility] Available via Epic secure chat 7am-7pm After these hours, please refer to coverage provider listed on [email or web address] 01/03/2023, 11:45 AM

## 2023-01-03 NOTE — Progress Notes (Addendum)
Rounding Note    Patient Name: Lisa [name] Date of Encounter: 01/03/2023  [facility] Cardiologist: Marjo Bicker, MD   Subjective   Feeling well this morning. Up working with PT.   Inpatient Medications    Scheduled Meds:  sodium chloride   Intravenous Once   amLODipine  5 mg Oral Daily   aspirin EC  81 mg Oral Daily   carvedilol  12.5 mg Oral BID WC   clopidogrel  75 mg Oral Daily   isosorbide mononitrate  90 mg Oral Daily   ranolazine  500 mg Oral BID   rosuvastatin  20 mg Oral q1800   Continuous Infusions:  PRN Meds: docusate sodium, hydrALAZINE, ipratropium-albuterol, nitroGLYCERIN, polyethylene glycol   Vital Signs    Vitals:   01/03/23 0011 01/03/23 0508 01/03/23 0751 01/03/23 0926  BP: 113/75 (!) 159/57 (!) 176/71 (!) 171/49  Pulse: (!) 47 (!) 59 64   Resp: 19 19 20 20   Temp: 97.7 F (36.5 C) 97.7 F (36.5 C) 97.7 F (36.5 C)   TempSrc:  Oral Oral   SpO2: 99% 100% 100% 100%  Weight:  47.3 kg      Intake/Output Summary (Last 24 hours) at 01/03/2023 1015 Last data filed at 01/03/2023 0816 Gross per 24 hour  Intake 1141 ml  Output 400 ml  Net 741 ml      01/03/2023    5:08 AM 01/02/2023    5:23 AM 01/01/2023   11:00 PM  Last 3 Weights  Weight (lbs) 104 lb 3.2 oz 101 lb 103 lb 1.6 oz  Weight (kg) 47.265 kg 45.813 kg 46.766 kg      Telemetry    Sinus Rhythm - Personally Reviewed  Physical Exam   GEN: No acute distress.   Neck: No JVD Cardiac: RRR, no murmurs, rubs, or gallops.  Respiratory: Diminished with bilateral rhonchi GI: Soft, nontender, non-distended  MS: No edema; No deformity. Neuro:  Nonfocal  Psych: Normal affect   Labs    High Sensitivity Troponin:   Recent Labs  Lab 12/11/22 0942 12/16/22 0628 12/16/22 0837 12/31/22 2012 12/31/22 2218  TROPONINIHS 36* 56* 2,115* 26* 1,745*     Chemistry Recent Labs  Lab 01/01/23 0626 01/02/23 0128 01/02/23 2348  NA 137 136 136  K 4.3 4.7 4.5  CL 106  104 107  CO2 22 25 24   GLUCOSE 141* 111* 80  BUN 70* 77* 70*  CREATININE 2.17* 2.04* 1.81*  CALCIUM 8.8* 8.4* 8.5*  MG 2.2 2.0  --   GFRNONAA 22* 23* 27*  ANIONGAP 9 7 5     Lipids No results for input(s): "CHOL", "TRIG", "HDL", "LABVLDL", "LDLCALC", "CHOLHDL" in the last 168 hours.  Hematology Recent Labs  Lab 01/01/23 0626 01/02/23 0128 01/02/23 1726 01/02/23 2348  WBC 5.3 8.0  --  5.6  RBC 2.90* 2.27*  --  2.77*  HGB 8.5* 6.9* 8.9* 8.1*  HCT 26.3* 21.4* 27.7* 25.2*  MCV 90.7 94.3  --  91.0  MCH 29.3 30.4  --  29.2  MCHC 32.3 32.2  --  32.1  RDW 15.9* 16.3*  --  17.4*  PLT 89* 83*  --  86*   Thyroid No results for input(s): "TSH", "FREET4" in the last 168 hours.  BNP Recent Labs  Lab 12/31/22 2012  BNP 967.6*    DDimer No results for input(s): "DDIMER" in the last 168 hours.   Radiology    No results found.  Cardiac Studies  Echo 11/16/22    1. Left ventricular ejection fraction, by estimation, is 55 to 60%. The  left ventricle has normal function. The left ventricle demonstrates  regional wall motion abnormalities (see scoring diagram/findings for  description). Left ventricular diastolic  parameters are consistent with Grade II diastolic dysfunction  (pseudonormalization).   2. Right ventricular systolic function is normal. The right ventricular  size is normal. There is severely elevated pulmonary artery systolic  pressure.   3. Left atrial size was mildly dilated.   4. The mitral valve is abnormal. Mild mitral valve regurgitation. No  evidence of mitral stenosis. Severe mitral annular calcification.   5. The aortic valve has an indeterminant number of cusps. Aortic valve  regurgitation is not visualized. No aortic stenosis is present.   6. The inferior vena cava is dilated in size with >50% respiratory  variability, suggesting right atrial pressure of 8 mmHg.   Patient Profile     88 y.o. female with PMH of chronic heart failure preserved ejection  fraction, hypertension, chronic kidney disease stage IV, COPD, adenocarcinoma of the lung and MGUS.  Assessment & Plan    Chest pain NSTEMI -- Presented with chest pain and shortness of breath. Overall improving -- EKG with ST changes, troponin 1745 -- after discussion with the patient and Dr. Servando Salina plan is to treat medically -- continue aspirin 81mg  daily, coreg 12.5mg  BID, plavix 75mg  daily, imdur 90mg  daily, crestor 20mg  daily, ranexa 500mg  BID   HFpEF -- grade II diastolic dysfunction on echo 11/16/22 -- on 20mg  lasix daily at home, consider restarting on discharge -- euvolemic on exam    Acute on Chronic Anemia -- hgb dropped to 6.9 yesterday, given an addition 1 unit PRBCs with improvement to 8.1 today -- reports no frank bleeding in urine or stool  HTN -- blood pressures elevated this morning -- continue core 12.5mg  BID, increase norvasc to 10mg  daily  Per primary CKD IV COPD Lung CA  Will arrange for outpatient follow up in the office  For questions or updates, please contact [facility] Please consult [email or web address] for contact info under  Signed, Laverda Page, NP  01/03/2023, 10:15 AM    Patient seen and examined, note reviewed with the signed Advanced Practice Provider. I personally reviewed laboratory data, imaging studies and relevant notes. I independently examined the patient and formulated the important aspects of the plan. I have personally discussed the plan with the patient and/or family. Comments or changes to the note/plan are indicated below.  Clinically she appears to be stable from a cardiovascular standpoint.  Will sign off at this time.   Thomasene Ripple DO, MS [facility] Attending Cardiologist [facility]  [address] [place], [place] [address] [phone] Website: [email or web address]

## 2023-01-03 NOTE — Progress Notes (Addendum)
Physical Therapy Treatment Patient Details Name: Lisa [name] MRN: [id number] DOB: 08/08/1934 Today's Date: 01/03/2023   History of Present Illness 88yo female wh presented to [facility] ED on 12/31/22 with SOB and chest tightness. Found to be in significant HTN and placed on BIPAP. EKG also with diffuse ST changes. Admitted to ICU due to hypertensive emergency. PMH anemia, carotid stenosis, CKD, COPD, hepatitis C, hx radiation therapy, HTN, MGUS    PT Comments  PTA entered room as patient was complaining about her bottom moving and needed to reposition.  Performed short bout of gt training before she complained of SHOB and needing to sit.  At this time OT entered and left pt in care of OT for back to bed.  Will continue to follow acutely.  SPO2 96% on 2L [place] post gt training.      Assistance Recommended at Discharge Frequent or constant Supervision/Assistance  If plan is discharge home, recommend the following:  Can travel by private vehicle    A little help with walking and/or transfers;Assistance with cooking/housework;Assist for transportation;A little help with bathing/dressing/bathroom;Help with stairs or ramp for entrance   Yes  Equipment Recommendations  Wheelchair (measurements PT);Wheelchair cushion (measurements PT)    Recommendations for Other Services       Precautions / Restrictions Precautions Precautions: Fall;Other (comment) Precaution Comments: watch O2 Restrictions Weight Bearing Restrictions: No     Mobility  Bed Mobility Overal bed mobility: Modified Independent             General bed mobility comments: returned to supine    Transfers Overall transfer level: Needs assistance Equipment used: Rolling walker (2 wheels) Transfers: Sit to/from Stand Sit to Stand: Min guard           General transfer comment: cues for hand placement    Ambulation/Gait Ambulation/Gait assistance: Min assist Gait Distance (Feet): 6 Feet Assistive device: Rolling  walker (2 wheels) Gait Pattern/deviations: Step-through pattern, Ataxic, Trendelenburg, Drifts right/left, Trunk flexed, Narrow base of support Gait velocity: decreased     General Gait Details: Performed short bout of gt training before she reported needing to sit due to [facility].  Pt sat on BSC this session. Obtained SPO2 and sats were 96% on 2L [place].  Ataxia worsened with turn to the R and backing to sit on commode.   Stairs             Wheelchair Mobility     Tilt Bed    Modified Rankin (Stroke Patients Only)       Balance Overall balance assessment: Needs assistance Sitting-balance support: Bilateral upper extremity supported, Feet supported Sitting balance-Leahy Scale: Good     Standing balance support: Bilateral upper extremity supported, Reliant on assistive device for balance Standing balance-Leahy Scale: Poor                              Cognition Arousal/Alertness: Awake/alert Behavior During Therapy: WFL for tasks assessed/performed Overall Cognitive Status: Within Functional Limits for tasks assessed                                          Exercises      General Comments        Pertinent Vitals/Pain Pain Assessment Pain Assessment: Faces Faces Pain Scale: Hurts a little bit Pain Location: bottom sitting in recliner. Pain Intervention(s):  Repositioned (asked nurse secretary to order cushion for her chair.)    Home Living Family/patient expects to be discharged to:: Private residence Living Arrangements: Alone Available Help at Discharge: Family;Available PRN/intermittently Type of Home: House Home Access: Stairs to enter Entrance Stairs-Rails: Can reach both;Right;Left Entrance Stairs-Number of Steps: 6STEwith rails (but rail is not good quality) Alternate Level Stairs-Number of Steps: can stay on main level, has ramp to 2nd level Home Layout: Two level;Able to live on main level with bedroom/bathroom Home  Equipment: Rolling Walker (2 wheels);Rollator (4 wheels);Wheelchair - manual;Shower seat Additional Comments: at times needs assist getting to bathroom due to fatigue/weakness, walker won't fit in bathroom ; poor activity tolerance and per family sometimes ahs trouble controlling limbs    Prior Function            PT Goals (current goals can now be found in the care plan section) Acute Rehab PT Goals Patient Stated Goal: get better PT Goal Formulation: With patient Time For Goal Achievement: 01/16/23 Potential to Achieve Goals: Fair Progress towards PT goals: Progressing toward goals    Frequency    Min 1X/week      PT Plan Current plan remains appropriate    Co-evaluation              AM-PAC PT "6 Clicks" Mobility   Outcome Measure  Help needed turning from your back to your side while in a flat bed without using bedrails?: None Help needed moving from lying on your back to sitting on the side of a flat bed without using bedrails?: None Help needed moving to and from a bed to a chair (including a wheelchair)?: A Little Help needed standing up from a chair using your arms (e.g., wheelchair or bedside chair)?: A Little Help needed to walk in hospital room?: A Lot Help needed climbing 3-5 steps with a railing? : A Lot 6 Click Score: 18    End of Session Equipment Utilized During Treatment: Gait belt Activity Tolerance: Patient limited by fatigue;Patient tolerated treatment well Patient left:  (Left with OT dovetailing session) Nurse Communication: Mobility status (need for geo mat) PT Visit Diagnosis: Unsteadiness on feet (R26.81);Muscle weakness (generalized) (M62.81);Other abnormalities of gait and mobility (R26.89);Ataxic gait (R26.0);Difficulty in walking, not elsewhere classified (R26.2)     Time: 1610-9604 PT Time Calculation (min) (ACUTE ONLY): 15 min  Charges:     $Therapeutic Activity: 8-22 mins PT General Charges $$ ACUTE PT VISIT: 1 Visit                      Bonney Leitz , PTA Acute Rehabilitation Services Office [phone]    Jasara Corrigan Artis Delay 01/03/2023, 1:24 PM

## 2023-01-03 NOTE — Progress Notes (Signed)
Physical Therapy Treatment Patient Details Name: [place] Nolton MRN: [id number] DOB: March 21, 1935 Today's Date: 01/03/2023   History of Present Illness 88yo female wh presented to [facility] ED on 12/31/22 with SOB and chest tightness. Found to be in significant HTN and placed on BIPAP. EKG also with diffuse ST changes. Admitted to ICU due to hypertensive emergency. PMH anemia, carotid stenosis, CKD, COPD, hepatitis C, hx radiation therapy, HTN, MGUS    PT Comments  Pt received up in recliner this morning with ongoing elevated high BP (170s/50s); deferred gait training due to HTN and focused on functional exercise/mm endurance with close monitoring of vitals during session. BP did actually improve a bit with activity but pt very fatigued by exercises performed today (seated ankle DF, LAQs no resistance, seated marches, STS, standing toe taps forward, standing marches with rest breaks/vitals checks PRN). Left up in recliner with all needs met, will continue to follow.      Assistance Recommended at Discharge Frequent or constant Supervision/Assistance  If plan is discharge home, recommend the following:  Can travel by private vehicle    A little help with walking and/or transfers;Assistance with cooking/housework;Assist for transportation;A little help with bathing/dressing/bathroom;Help with stairs or ramp for entrance   Yes  Equipment Recommendations  Wheelchair (measurements PT);Wheelchair cushion (measurements PT)    Recommendations for Other Services       Precautions / Restrictions Precautions Precautions: Fall;Other (comment) Precaution Comments: watch O2 Restrictions Weight Bearing Restrictions: No     Mobility  Bed Mobility               General bed mobility comments: up in reciner upon entry    Transfers Overall transfer level: Needs assistance Equipment used: Rolling walker (2 wheels) Transfers: Sit to/from Stand Sit to Stand: Min guard           General  transfer comment: Min guard, extra time, cues for hand placement instead of pulling on RW    Ambulation/Gait               General Gait Details: focus on ther ex this session   Stairs             Wheelchair Mobility     Tilt Bed    Modified Rankin (Stroke Patients Only)       Balance Overall balance assessment: Needs assistance Sitting-balance support: Bilateral upper extremity supported, Feet supported Sitting balance-Leahy Scale: Good     Standing balance support: Bilateral upper extremity supported, Reliant on assistive device for balance Standing balance-Leahy Scale: Poor                              Cognition Arousal/Alertness: Awake/alert Behavior During Therapy: WFL for tasks assessed/performed Overall Cognitive Status: Within Functional Limits for tasks assessed                                          Exercises      General Comments        Pertinent Vitals/Pain Pain Assessment Pain Assessment: No/denies pain    Home Living                          Prior Function            PT Goals (current goals can now be  found in the care plan section) Acute Rehab PT Goals Patient Stated Goal: get better PT Goal Formulation: With patient Time For Goal Achievement: 01/16/23 Potential to Achieve Goals: Fair Progress towards PT goals: Progressing toward goals    Frequency    Min 1X/week      PT Plan Current plan remains appropriate    Co-evaluation              AM-PAC PT "6 Clicks" Mobility   Outcome Measure  Help needed turning from your back to your side while in a flat bed without using bedrails?: None Help needed moving from lying on your back to sitting on the side of a flat bed without using bedrails?: None Help needed moving to and from a bed to a chair (including a wheelchair)?: A Little Help needed standing up from a chair using your arms (e.g., wheelchair or bedside chair)?: A  Little Help needed to walk in hospital room?: A Lot Help needed climbing 3-5 steps with a railing? : A Lot 6 Click Score: 18    End of Session   Activity Tolerance: Patient limited by fatigue;Patient tolerated treatment well Patient left: in chair;with call bell/phone within reach Nurse Communication: Mobility status PT Visit Diagnosis: Unsteadiness on feet (R26.81);Muscle weakness (generalized) (M62.81);Other abnormalities of gait and mobility (R26.89);Ataxic gait (R26.0);Difficulty in walking, not elsewhere classified (R26.2)     Time: 1884-1660 PT Time Calculation (min) (ACUTE ONLY): 19 min  Charges:    $Therapeutic Exercise: 8-22 mins PT General Charges $$ ACUTE PT VISIT: 1 Visit                    Nedra Hai, PT, DPT 01/03/23 11:23 AM

## 2023-01-04 ENCOUNTER — Inpatient Hospital Stay: Payer: Medicare Other

## 2023-01-04 ENCOUNTER — Inpatient Hospital Stay: Payer: Medicare Other | Admitting: Oncology

## 2023-01-04 ENCOUNTER — Ambulatory Visit: Payer: Self-pay | Admitting: *Deleted

## 2023-01-04 DIAGNOSIS — J441 Chronic obstructive pulmonary disease with (acute) exacerbation: Secondary | ICD-10-CM | POA: Diagnosis not present

## 2023-01-04 DIAGNOSIS — J438 Other emphysema: Secondary | ICD-10-CM

## 2023-01-04 DIAGNOSIS — I10 Essential (primary) hypertension: Secondary | ICD-10-CM

## 2023-01-04 DIAGNOSIS — J9601 Acute respiratory failure with hypoxia: Secondary | ICD-10-CM | POA: Diagnosis not present

## 2023-01-04 DIAGNOSIS — I161 Hypertensive emergency: Secondary | ICD-10-CM | POA: Diagnosis not present

## 2023-01-04 LAB — CULTURE, BLOOD (ROUTINE X 2)

## 2023-01-04 LAB — CBC
HCT: 24.6 % — ABNORMAL LOW (ref 36.0–46.0)
MCH: 28.8 pg (ref 26.0–34.0)
MCHC: 32.5 g/dL (ref 30.0–36.0)
MCV: 88.5 fL (ref 80.0–100.0)
Platelets: 90 10*3/uL — ABNORMAL LOW (ref 150–400)
RBC: 2.78 MIL/uL — ABNORMAL LOW (ref 3.87–5.11)
RDW: 16.9 % — ABNORMAL HIGH (ref 11.5–15.5)
WBC: 4.4 10*3/uL (ref 4.0–10.5)
nRBC: 0 % (ref 0.0–0.2)

## 2023-01-04 LAB — BASIC METABOLIC PANEL
Anion gap: 7 (ref 5–15)
BUN: 61 mg/dL — ABNORMAL HIGH (ref 8–23)
CO2: 24 mmol/L (ref 22–32)
Calcium: 8.4 mg/dL — ABNORMAL LOW (ref 8.9–10.3)
Chloride: 104 mmol/L (ref 98–111)
Creatinine, Ser: 1.86 mg/dL — ABNORMAL HIGH (ref 0.44–1.00)
GFR, Estimated: 26 mL/min — ABNORMAL LOW (ref >60)
Glucose, Bld: 80 mg/dL (ref 70–99)
Potassium: 4.3 mmol/L (ref 3.5–5.1)
Sodium: 135 mmol/L (ref 135–145)

## 2023-01-04 NOTE — Patient Instructions (Addendum)
Visit Information  Thank you for taking time to visit with me today. Please don't hesitate to contact me if I can be of assistance to you.   Following are the goals we discussed today:   Goals Addressed             This Visit's Progress    [facility] care coordination services (MI, anemia)   Not on track    Interventions Today    Flowsheet Row Most Recent Value  Chronic Disease   Chronic disease during today's visit Other  [Family conference? Palliative care]  General Interventions   General Interventions Discussed/Reviewed General Interventions Reviewed, [employer], Communication with  [facility], [facility] RN CM & SW services ? family conference. spoke with Dr Durwin Nora during clinic hours 01/03/24 to  discuss ? family conference. confirmed he spoke with niece recently & discussed need for palliative care/goals of care, answered questions]  Communication with PCP/Specialists, RN, Social Work  Public house manager with [employer] SW, PCP - ? family conference, scheduling, palliative care. Updated SW & [facility] hospital liaison on request for palliative care visits while inpatient]              Our next appointment is by telephone on 01/08/23 at 1pm  Please call the care guide team at [phone] if you need to cancel or reschedule your appointment.   If you are experiencing a Mental Health or Behavioral Health Crisis or need someone to talk to, please call the Suicide and Crisis Lifeline: 988 call the [place] National Suicide Prevention Lifeline: 940-410-8814 or TTY: 201-214-1711 TTY (581) 676-9796) to talk to a trained counselor call 1-800-273-TALK (toll free, 24 hour hotline) call the [facility]: 919-603-2229 call 911   No access to mychart, no request for AVS to be mailed  The patient has been provided with contact information for the care management team and has been advised to call with any health related questions or concerns.   Garris Melhorn L. Noelle Penner, RN, BSN, CCM [facility] Care  Management Community Coordinator Office number [phone]

## 2023-01-04 NOTE — Plan of Care (Signed)

## 2023-01-04 NOTE — [place] FL2 (Signed)
[place] MEDICAID FL2 LEVEL OF CARE FORM     IDENTIFICATION  Patient Name: Lisa [name] Birthdate: [birth year] Sex: female Admission Date (Current Location): 12/31/2022  [facility] and [place] Number:  Producer, television/film/video and Address:  The . [facility], [address], [place], [place] [address]      Provider Number: [id number]  Attending Physician Name and Address:  Joycelyn Das, MD  Relative Name and Phone Number:       Current Level of Care: Hospital Recommended Level of Care: Skilled Nursing Facility Prior Approval Number:    Date Approved/Denied:   PASRR Number: [id number]  Discharge Plan: SNF    Current Diagnoses: Patient Active Problem List   Diagnosis Date Noted   Hypertensive emergency 12/31/2022   Acute on chronic anemia 12/17/2022   CAD (coronary artery disease) 12/08/2022   Impaired hearing 11/24/2022   Overgrown toenails 11/24/2022   Goals of care, counseling/discussion 11/24/2022   Acute congestive heart failure (HCC) 11/18/2022   Acute on chronic heart failure with preserved ejection fraction (HCC) 11/17/2022   Acute respiratory failure with hypoxia (HCC) 11/15/2022   Acute respiratory distress 11/15/2022   Need for pneumococcal 20-valent conjugate vaccination 10/16/2022   Dysmetria 09/04/2022   Encounter for general adult medical examination with abnormal findings 09/04/2022   COVID-19 12/29/2021   COVID-19 virus infection 12/28/2021   Elevated brain natriuretic peptide (BNP) level 12/28/2021   Elevated troponin 12/28/2021   Chronic kidney disease, stage 3b (HCC) 12/28/2021   FTT (failure to thrive) in adult 12/28/2021   Underweight 12/28/2021   Pain and swelling of forearm, left 12/28/2021   COPD exacerbation (HCC) 12/27/2021   Malignant neoplasm of upper lobe of left lung (HCC) 09/15/2021   Adenocarcinoma of left lung (HCC) 09/11/2021   Multinodular goiter 09/05/2021   Odynophagia 09/03/2021   Mass of upper lobe of  left lung 09/03/2021   Hypertensive urgency 09/02/2021   Chest pain 09/02/2021   CKD (chronic kidney disease) stage 4, GFR 15-29 ml/min (HCC) 09/02/2021   Tobacco abuse 09/02/2021   Myeloma (HCC) 09/02/2021   NSTEMI (non-ST elevated myocardial infarction) (HCC)    Demand ischemia    Iron deficiency anemia 07/29/2021   Anemia due to stage 4 chronic kidney disease (HCC) 07/22/2021   Chronic hepatitis C (HCC) 07/22/2021   Smoldering multiple myeloma 06/21/2021   Abnormal results of liver function studies 04/19/2021   Calculus of gallbladder without cholecystitis without obstruction    Gallstone pancreatitis 09/13/2018   Protein-calorie malnutrition, severe (HCC) 05/09/2014   Sepsis (HCC) 05/07/2014   Encephalopathy 05/07/2014   Back pain 05/07/2014   Bacteremia    COLD (chronic obstructive lung disease) (HCC)    Viridans streptococci infection    H/O goiter 04/10/2014   Moderate protein-calorie malnutrition (HCC) 04/10/2014   Acute encephalopathy 04/09/2014   Streptococcus viridans infection 04/08/2014   Leukocytosis 04/07/2014   Dehydration 04/07/2014   Fever 04/06/2014   UTI (lower urinary tract infection) 04/06/2014   ARF (acute renal failure) (HCC) 04/06/2014   Acute nontraumatic kidney injury (HCC) 04/06/2014   Hypertension    COPD (chronic obstructive pulmonary disease) (HCC)     Orientation RESPIRATION BLADDER Height & Weight     Self, Time, Situation, Place  Normal Incontinent, External catheter Weight: 102 lb 4.8 oz (46.4 kg) Height:     BEHAVIORAL SYMPTOMS/MOOD NEUROLOGICAL BOWEL NUTRITION STATUS      Continent Diet (See dc summary)  AMBULATORY STATUS COMMUNICATION OF NEEDS Skin   Limited  Assist Verbally Normal                       Personal Care Assistance Level of Assistance  Bathing, Feeding, Dressing Bathing Assistance: Limited assistance Feeding assistance: Independent Dressing Assistance: Limited assistance     Functional Limitations Info   Sight, Hearing, Speech Sight Info: Impaired Hearing Info: Adequate Speech Info: Adequate    SPECIAL CARE FACTORS FREQUENCY  OT (By licensed OT), PT (By licensed PT)     PT Frequency: 5xweek OT Frequency: 5xweek            Contractures Contractures Info: Not present    Additional Factors Info  Code Status, Allergies Code Status Info: DNR Allergies Info: NKA           Current Medications (01/04/2023):  This is the current hospital active medication list Current Facility-Administered Medications  Medication Dose Route Frequency Provider Last Rate Last Admin   0.9 %  sodium chloride infusion (Manually program via Guardrails IV Fluids)   Intravenous Once Rondel Baton, MD   Held at 12/31/22 2105   amLODipine (NORVASC) tablet 10 mg  10 mg Oral Daily Laverda Page B, NP   10 mg at 01/04/23 0908   aspirin EC tablet 81 mg  81 mg Oral Daily Duayne Cal, NP   81 mg at 01/04/23 0908   carvedilol (COREG) tablet 12.5 mg  12.5 mg Oral BID WC Paliwal, Aditya, MD   12.5 mg at 01/04/23 0908   clopidogrel (PLAVIX) tablet 75 mg  75 mg Oral Daily Duayne Cal, NP   75 mg at 01/04/23 1610   docusate sodium (COLACE) capsule 100 mg  100 mg Oral BID PRN Duayne Cal, NP       hydrALAZINE (APRESOLINE) injection 10 mg  10 mg Intravenous Q4H PRN Paliwal, Aditya, MD       ipratropium-albuterol (DUONEB) 0.5-2.5 (3) MG/3ML nebulizer solution 3 mL  3 mL Nebulization Q6H PRN Duayne Cal, NP   3 mL at 01/01/23 1950   isosorbide mononitrate (IMDUR) 24 hr tablet 90 mg  90 mg Oral Daily Duayne Cal, NP   90 mg at 01/04/23 9604   nitroGLYCERIN (NITROSTAT) SL tablet 0.4 mg  0.4 mg Sublingual Q5 min PRN Laverda Page B, NP   0.4 mg at 01/02/23 1015   polyethylene glycol (MIRALAX / GLYCOLAX) packet 17 g  17 g Oral Daily PRN Duayne Cal, NP       ranolazine (RANEXA) 12 hr tablet 500 mg  500 mg Oral BID Tobb, Kardie, DO   500 mg at 01/04/23 0908   rosuvastatin (CRESTOR) tablet 20 mg   20 mg Oral q1800 Duayne Cal, NP   20 mg at 01/03/23 1652   Facility-Administered Medications Ordered in Other Encounters  Medication Dose Route Frequency Provider Last Rate Last Admin   epoetin alfa-epbx (RETACRIT) injection 10,000 Units  10,000 Units Subcutaneous Once Mauro Kaufmann, NP         Discharge Medications: Please see discharge summary for a list of discharge medications.  Relevant Imaging Results:  Relevant Lab Results:   Additional Information SSN: [id number]  Oletta Lamas, MSW, [facility] Transitions of Care  Clinical Social Worker I

## 2023-01-04 NOTE — Plan of Care (Signed)
  Problem: Education: Goal: Knowledge of General Education information will improve Description Including pain rating scale, medication(s)/side effects and non-pharmacologic comfort measures Outcome: Progressing   Problem: Health Behavior/Discharge Planning: Goal: Ability to manage health-related needs will improve Outcome: Progressing   

## 2023-01-04 NOTE — Progress Notes (Signed)
Occupational Therapy Treatment Patient Details Name: Lisa [name] MRN: [id number] DOB: 1934/12/16 Today's Date: 01/04/2023   History of present illness 87yo female wh presented to [facility] ED on 12/31/22 with SOB and chest tightness. Found to be in significant HTN and placed on BIPAP. EKG also with diffuse ST changes. Admitted to ICU due to hypertensive emergency. PMH anemia, carotid stenosis, CKD, COPD, hepatitis C, hx radiation therapy, HTN, MGUS   OT comments  Pt eager for OOB and to complete ADLs. Min to mod assist for sponge bathing, set up for UB dressing, mod assist for changing mesh underpants due to heavy reliance on UEs for standing balance. Pt with SpO2 of 94% on RA. No c/o dizziness this visit. Continues to be for inpatient follow up therapy, <3 hours/day.    Recommendations for follow up therapy are one component of a multi-disciplinary discharge planning process, led by the attending physician.  Recommendations may be updated based on patient status, additional functional criteria and insurance authorization.    Assistance Recommended at Discharge Frequent or constant Supervision/Assistance  Patient can return home with the following  A little help with walking and/or transfers;A little help with bathing/dressing/bathroom;Assistance with cooking/housework;Assist for transportation;Help with stairs or ramp for entrance;Direct supervision/assist for medications management;Direct supervision/assist for financial management   Equipment Recommendations  None recommended by OT    Recommendations for Other Services      Precautions / Restrictions Precautions Precautions: Fall;Other (comment) Precaution Comments: watch O2 Restrictions Weight Bearing Restrictions: No       Mobility Bed Mobility                    Transfers Overall transfer level: Needs assistance Equipment used: Rolling walker (2 wheels) Transfers: Sit to/from Stand Sit to Stand: Min guard                  Balance Overall balance assessment: Needs assistance   Sitting balance-Leahy Scale: Good     Standing balance support: Bilateral upper extremity supported, Reliant on assistive device for balance Standing balance-Leahy Scale: Poor                             ADL either performed or assessed with clinical judgement   ADL Overall ADL's : Needs assistance/impaired     Grooming: Wash/dry hands;Wash/dry face;Sitting;Set up   Upper Body Bathing: Minimal assistance;Sitting Upper Body Bathing Details (indicate cue type and reason): assisted with back     Upper Body Dressing : Set up;Sitting   Lower Body Dressing: Moderate assistance;Sit to/from stand Lower Body Dressing Details (indicate cue type and reason): for mesh panties             Functional mobility during ADLs: Minimal assistance;Rolling walker (2 wheels)      Extremity/Trunk Assessment              Vision       Perception     Praxis      Cognition Arousal/Alertness: Awake/alert Behavior During Therapy: WFL for tasks assessed/performed Overall Cognitive Status: Within Functional Limits for tasks assessed                                          Exercises      Shoulder Instructions       General Comments  Pertinent Vitals/ Pain       Pain Assessment Pain Assessment: No/denies pain  Home Living                                          Prior Functioning/Environment              Frequency  Min 1X/week        Progress Toward Goals  OT Goals(current goals can now be found in the care plan section)  Progress towards OT goals: Progressing toward goals  Acute Rehab OT Goals OT Goal Formulation: With patient Time For Goal Achievement: 01/17/23 Potential to Achieve Goals: Good  Plan Discharge plan remains appropriate    Co-evaluation                 AM-PAC OT "6 Clicks" Daily Activity     Outcome Measure    Help from another person eating meals?: A Little Help from another person taking care of personal grooming?: A Little Help from another person toileting, which includes using toliet, bedpan, or urinal?: A Lot Help from another person bathing (including washing, rinsing, drying)?: A Little Help from another person to put on and taking off regular upper body clothing?: A Little Help from another person to put on and taking off regular lower body clothing?: A Lot 6 Click Score: 16    End of Session Equipment Utilized During Treatment: Gait belt;Rolling walker (2 wheels)  OT Visit Diagnosis: Unsteadiness on feet (R26.81);Other abnormalities of gait and mobility (R26.89)   Activity Tolerance Patient tolerated treatment well   Patient Left in chair;with call bell/phone within reach;with chair alarm set   Nurse Communication          Time: [phone] OT Time Calculation (min): 31 min  Charges: OT General Charges $OT Visit: 1 Visit OT Treatments $Self Care/Home Management : 23-37 mins  Berna Spare, OTR/L Acute Rehabilitation Services Office: [phone]  Lisa [name] 01/04/2023, 9:44 AM

## 2023-01-04 NOTE — Patient Outreach (Signed)
  Care Coordination   Collaboration with MD & SW  Visit Note   01/04/2023 Name: Lisa [name] MRN: [id number] DOB: February 20, 1935  Lisa [name] is a 88 y.o. year old female who sees Durwin Nora, Lucina Mellow, MD for primary care. I  collaborated with pcp & SW about family conference, palliative care +  What matters to the patients health and wellness today?  Family conference? Palliative care SW & RN CM open for 7/07/21/22 conference, PCP will not be available Spoke with Dr Durwin Nora during clinic hours He spoke with Bronson Ing, niece about patient health and palliative care/goals of care and states he answered most of their concerns.   PCP voiced concern with patient various ED visits for same symptoms with same outcome.  He wants to see if palliative care can see patient & family during all hospital visits.   Patient had another admission on 12/31/22   Goals Addressed             This Visit's Progress    [facility] care coordination services (MI, anemia)   Not on track    Interventions Today    Flowsheet Row Most Recent Value  Chronic Disease   Chronic disease during today's visit Other  [Family conference? Palliative care]  General Interventions   General Interventions Discussed/Reviewed General Interventions Reviewed, [employer], Communication with  [place] care, [facility] RN CM & SW services ? family conference. spoke with Dr Durwin Nora during clinic hours 01/03/24 to  discuss ? family conference. confirmed he spoke with niece recently & discussed need for palliative care/goals of care, answered questions]  Communication with PCP/Specialists, RN, Social Work  Public house manager with [employer] SW, PCP - ? family conference, scheduling, palliative care. Updated SW & [facility] hospital liaison on request for palliative care visits while inpatient]              SDOH assessments and interventions completed:  No     Care Coordination Interventions:  Yes, provided   Follow up plan: Follow up call scheduled  for 01/08/23    Encounter Outcome:  Pt. Visit Completed   Taimur Fier L. Noelle Penner, RN, BSN, CCM [facility] Care Management Community Coordinator Office number [phone]

## 2023-01-04 NOTE — TOC Initial Note (Signed)
Transition of Care [facility]) - Initial/Assessment Note    Patient Details  Name: Lisa [name] MRN: [id number] Date of Birth: [birth year]  [facility] CM/SW Contact:    Leander Rams, LCSW Phone Number: 01/04/2023, 10:04 AM  Clinical Narrative:                 CSW met with pt at bedside to discuss disposition. Pt reports she has previously gone to rehab and is agreeable to go again. Pt preference is a facility in [place] as that is where she lives.   CSW will complete fl2 and fax out. TOC will continue to follow.  Expected Discharge Plan: Skilled Nursing Facility Barriers to Discharge: Continued Medical Work up   Patient Goals and CMS Choice Patient states their goals for this hospitalization and ongoing recovery are:: Go to rehab in [place]          Expected Discharge Plan and Services       Living arrangements for the past 2 months: Single Family Home                             HH Agency: [facility]        Prior Living Arrangements/Services Living arrangements for the past 2 months: Single Family Home Lives with:: Self Patient language and need for interpreter reviewed:: Yes Do you feel safe going back to the place where you live?: Yes      Need for Family Participation in Patient Care: Yes (Comment) Care giver support system in place?: Yes (comment) Current home services: DME Criminal Activity/Legal Involvement Pertinent to Current Situation/Hospitalization: No - Comment as needed  Activities of Daily Living      Permission Sought/Granted Permission sought to share information with : Family Supports Permission granted to share information with : Yes, Verbal Permission Granted  Share Information with NAME: Bronson Ing     Permission granted to share info w Relationship: Niece  Permission granted to share info w Contact Information: [phone]  Emotional Assessment Appearance:: Appears stated age, Developmentally  appropriate Attitude/Demeanor/Rapport: Engaged, Gracious Affect (typically observed): Appropriate, Calm Orientation: : Oriented to Self, Oriented to Place, Oriented to  Time, Oriented to Situation Alcohol / Substance Use: Not Applicable Psych Involvement: No (comment)  Admission diagnosis:  Respiratory distress [R06.03] COPD exacerbation (HCC) [J44.1] NSTEMI (non-ST elevated myocardial infarction) (HCC) [I21.4] Hypertensive emergency [I16.1] Patient Active Problem List   Diagnosis Date Noted   Hypertensive emergency 12/31/2022   Acute on chronic anemia 12/17/2022   CAD (coronary artery disease) 12/08/2022   Impaired hearing 11/24/2022   Overgrown toenails 11/24/2022   Goals of care, counseling/discussion 11/24/2022   Acute congestive heart failure (HCC) 11/18/2022   Acute on chronic heart failure with preserved ejection fraction (HCC) 11/17/2022   Acute respiratory failure with hypoxia (HCC) 11/15/2022   Acute respiratory distress 11/15/2022   Need for pneumococcal 20-valent conjugate vaccination 10/16/2022   Dysmetria 09/04/2022   Encounter for general adult medical examination with abnormal findings 09/04/2022   COVID-19 12/29/2021   COVID-19 virus infection 12/28/2021   Elevated brain natriuretic peptide (BNP) level 12/28/2021   Elevated troponin 12/28/2021   Chronic kidney disease, stage 3b (HCC) 12/28/2021   FTT (failure to thrive) in adult 12/28/2021   Underweight 12/28/2021   Pain and swelling of forearm, left 12/28/2021   COPD exacerbation (HCC) 12/27/2021   Malignant neoplasm of upper lobe of left lung (HCC) 09/15/2021   Adenocarcinoma  of left lung (HCC) 09/11/2021   Multinodular goiter 09/05/2021   Odynophagia 09/03/2021   Mass of upper lobe of left lung 09/03/2021   Hypertensive urgency 09/02/2021   Chest pain 09/02/2021   CKD (chronic kidney disease) stage 4, GFR 15-29 ml/min (HCC) 09/02/2021   Tobacco abuse 09/02/2021   Myeloma (HCC) 09/02/2021   NSTEMI  (non-ST elevated myocardial infarction) [facility])    Demand ischemia    Iron deficiency anemia 07/29/2021   Anemia due to stage 4 chronic kidney disease (HCC) 07/22/2021   Chronic hepatitis C (HCC) 07/22/2021   Smoldering multiple myeloma 06/21/2021   Abnormal results of liver function studies 04/19/2021   Calculus of gallbladder without cholecystitis without obstruction    Gallstone pancreatitis 09/13/2018   Protein-calorie malnutrition, severe (HCC) 05/09/2014   Sepsis (HCC) 05/07/2014   Encephalopathy 05/07/2014   Back pain 05/07/2014   Bacteremia    COLD (chronic obstructive lung disease) (HCC)    Viridans streptococci infection    H/O goiter 04/10/2014   Moderate protein-calorie malnutrition (HCC) 04/10/2014   Acute encephalopathy 04/09/2014   Streptococcus viridans infection 04/08/2014   Leukocytosis 04/07/2014   Dehydration 04/07/2014   Fever 04/06/2014   UTI (lower urinary tract infection) 04/06/2014   ARF (acute renal failure) (HCC) 04/06/2014   Acute nontraumatic kidney injury (HCC) 04/06/2014   Hypertension    COPD (chronic obstructive pulmonary disease) (HCC)    PCP:  [name], MD Pharmacy:   [facility] [place], [place] - [address] [place] [address] Phone: [phone] Fax: [phone]     Social Determinants of Health (SDOH) Social History: SDOH Screenings   Food Insecurity: Patient Declined (12/19/2022)  Housing: Low Risk  (12/19/2022)  Transportation Needs: No Transportation Needs (12/19/2022)  Utilities: Not At Risk (12/19/2022)  Alcohol Screen: Low Risk  (10/24/2022)  Depression (PHQ2-9): Low Risk  (12/12/2022)  Financial Resource Strain: Low Risk  (10/24/2022)  Physical Activity: Inactive (10/24/2022)  Social Connections: Moderately Isolated (12/01/2022)  Stress: No Stress Concern Present (12/01/2022)  Tobacco Use: Medium Risk (01/03/2023)   SDOH Interventions:     Readmission Risk Interventions    12/17/2022    3:16  PM 11/16/2022    1:13 PM 09/06/2021    9:23 AM  Readmission Risk Prevention Plan  Transportation Screening  Complete Complete  PCP or Specialist Appt within 3-5 Days   Complete  HRI or Home Care Consult  Complete Complete  Social Work Consult for Recovery Care Planning/Counseling  Complete Complete  Palliative Care Screening  Complete Not Applicable  Medication Review Oceanographer) Complete Complete Complete  PCP or Specialist appointment within 3-5 days of discharge Complete     Oletta Lamas, MSW, LCSWA, LCASA Transitions of Care  Clinical Social Worker I

## 2023-01-04 NOTE — Progress Notes (Signed)
PROGRESS NOTE    Lisa [name]  ZOX:[id number] DOB: 12-18-1934 DOA: 12/31/2022 PCP: [name], MD    Brief Narrative:   88 year old female  with past medical history significant for HTN, CAD, COPD, lung CA status post radiation, CKD stage 3b, MGUS presented to the hospital with shortness of breath and chest tightness.  Patient admission x 2 for non-ST elevation MI when she had refused cardiac catheterization , managed medically.  On this admission, she was initially put on BiPAP for increased work of breathing.   EKG was significant for diffuse ST changes and cardiology was consulted. Nitroglycerin infusion was started for chest pain and blood pressure control. PCCM was asked to admit to ICU for hypertensive emergency.  Patient was subsequently weaned off nitroglycerin drip and was transferred out of the ICU.  Assessment and Plan:   Hypertensive Emergency:  Resolved.  Off nitroglycerin drip.  Currently on amlodipine and Coreg. Weaned off nitroglycerin infusion.  Blood pressure seems to be reasonably controlled.  Will continue to monitor closely.     Acute hypoxic respiratory failure in the setting of acute pulmonary edema Acute on chronic HFpEF Continue strict intake and output charting.  Has received diuretics with improvement.  Currently on 2 L of oxygen by nasal cannula.  Plan to restart p.o. Lasix on discharge   NSTEMI;  EKG showed ST and T wave changes with elevated troponin.  History of refusing cath in the past and refused at this time as well.  On aspirin Plavix Coreg and Crestor.  Ranexa has been added due to intermittent chest pain.  Cardiology followed the patient during hospitalization and will need outpatient follow-up.  .   Anemia of chronic kidney disease Patient received 1 unit of packed RBC for hemoglobin of 6.9 on 01/02/2023.  Hemoglobin today at 8.0.  No signs of bleeding.  Has mild thrombocytopenia will need to continue to monitor.  Acute kidney injury on  stage 4 CKD:  Latest creatinine at 1.8 , from 2.0.  Stable at this time.  Continue to monitor..   Acute COPD exacerbation. Continue DuoNebs, incentive spirometry, continue to wean oxygen as able.    Thrombocytopenia:  Continue to monitor for bleeding, platelet count improving to 90 from 86.    DVT prophylaxis: Place and maintain sequential compression device Start: 01/03/23 [id number].   Code Status:     Code Status: DNR  Disposition: Skilled nursing facility as per PT OT evaluation likely on 01/05/2019.  Status is: Inpatient  Remains inpatient appropriate because: Pending skilled nursing facility placement,   Family Communication: Spoke with the patient's niece and HPOA on the phone and updated her about the clinical condition of the patient on 7/24.   Consultants:  Cardiology PCCM  Procedures:  Transfusion of 1 unit of packed RBC  Antimicrobials:  None   Subjective: Today, patient was seen and examined at bedside.  Patient denies any chest pain, shortness of breath, cough, fever, chills or rigor.  Still on supplemental oxygen.  Has generalized weakness.  Objective: Vitals:   01/04/23 0740 01/04/23 0741 01/04/23 0750 01/04/23 0800  BP:   96/62   Pulse:   64   Resp:   18   Temp:   98.1 F (36.7 C)   TempSrc:   Oral   SpO2: 98% 94% 93% 94%  Weight:        Intake/Output Summary (Last 24 hours) at 01/04/2023 1034 Last data filed at 01/04/2023 0515 Gross per 24 hour  Intake 240  ml  Output 100 ml  Net 140 ml   Filed Weights   01/02/23 0523 01/03/23 0508 01/04/23 0510  Weight: 45.8 kg 47.3 kg 46.4 kg    Physical Examination: Body mass index is 16.02 kg/m.   General: Thinly built, not in obvious distress, on nasal cannula oxygen HENT:   Pallor noted. Oral mucosa is moist.  Chest: .  Diminished breath sounds bilaterally.  No obvious wheezes or crackles. CVS: S1 &S2 heard. No murmur.  Regular rate and rhythm. Abdomen: Soft, nontender, nondistended.  Bowel sounds  are heard.   Extremities: No cyanosis, clubbing or edema.  Peripheral pulses are palpable. Psych: Alert, awake and oriented, normal mood CNS:  No cranial nerve deficits.  Power equal in all extremities.   Skin: Warm and dry.  No rashes noted.  Data Reviewed:   CBC: Recent Labs  Lab 12/31/22 2012 12/31/22 2023 01/01/23 0626 01/02/23 0128 01/02/23 1726 01/02/23 2348 01/04/23 0056  WBC 7.6  --  5.3 8.0  --  5.6 4.4  HGB 7.0*   < > 8.5* 6.9* 8.9* 8.1* 8.0*  HCT 23.5*   < > 26.3* 21.4* 27.7* 25.2* 24.6*  MCV 99.2  --  90.7 94.3  --  91.0 88.5  PLT 108*  --  89* 83*  --  86* 90*   < > = values in this interval not displayed.    Basic Metabolic Panel: Recent Labs  Lab 12/31/22 2012 12/31/22 2023 12/31/22 2224 01/01/23 0626 01/02/23 0128 01/02/23 2348 01/04/23 0056  NA 137   < > 139 137 136 136 135  K 4.6   < > 4.6 4.3 4.7 4.5 4.3  CL 102  --   --  106 104 107 104  CO2 21*  --   --  22 25 24 24   GLUCOSE 185*  --   --  141* 111* 80 80  BUN 69*  --   --  70* 77* 70* 61*  CREATININE 2.13*  --   --  2.17* 2.04* 1.81* 1.86*  CALCIUM 9.0  --   --  8.8* 8.4* 8.5* 8.4*  MG  --   --   --  2.2 2.0  --  2.1  PHOS  --   --   --  4.4 4.3  --   --    < > = values in this interval not displayed.    Liver Function Tests: No results for input(s): "AST", "ALT", "ALKPHOS", "BILITOT", "PROT", "ALBUMIN" in the last 168 hours.   Radiology Studies: No results found.    LOS: 4 days    Joycelyn Das, MD [facility] Available via Epic secure chat 7am-7pm After these hours, please refer to coverage provider listed on [email or web address] 01/04/2023, 10:34 AM

## 2023-01-04 NOTE — Patient Instructions (Signed)
Visit Information  Thank you for taking time to visit with me today. Please don't hesitate to contact me if I can be of assistance to you.   Following are the goals we discussed today:   Goals Addressed   None     Our next appointment is by telephone on 01/08/23 at 1 pm  Please call the care guide team at [phone] if you need to cancel or reschedule your appointment.   If you are experiencing a Mental Health or Behavioral Health Crisis or need someone to talk to, please call the Suicide and Crisis Lifeline: 988 call the [place] National Suicide Prevention Lifeline: 317-809-7486 or TTY: 810 411 1502 TTY 617-087-4565) to talk to a trained counselor call 1-800-273-TALK (toll free, 24 hour hotline) call the [facility]: [phone] call 911   No access to my chart, no requested AVS   The patient has been provided with contact information for the care management team and has been advised to call with any health related questions or concerns.   Chan Rosasco L. Noelle Penner, RN, BSN, CCM [facility] Care Management Community Coordinator Office number [phone]

## 2023-01-04 NOTE — Plan of Care (Signed)
  Problem: Safety: Goal: Ability to remain free from injury will improve Outcome: Progressing   

## 2023-01-05 DIAGNOSIS — J9601 Acute respiratory failure with hypoxia: Secondary | ICD-10-CM | POA: Diagnosis not present

## 2023-01-05 DIAGNOSIS — I161 Hypertensive emergency: Secondary | ICD-10-CM | POA: Diagnosis not present

## 2023-01-05 DIAGNOSIS — J438 Other emphysema: Secondary | ICD-10-CM | POA: Diagnosis not present

## 2023-01-05 DIAGNOSIS — J441 Chronic obstructive pulmonary disease with (acute) exacerbation: Secondary | ICD-10-CM | POA: Diagnosis not present

## 2023-01-05 MED ORDER — FUROSEMIDE 20 MG PO TABS
20.0000 mg | ORAL_TABLET | Freq: Every day | ORAL | Status: DC
  Administered 2023-01-05 – 2023-01-10 (×6): 20 mg via ORAL
  Filled 2023-01-05 (×6): qty 1

## 2023-01-05 MED ORDER — FERROUS SULFATE 325 (65 FE) MG PO TABS
325.0000 mg | ORAL_TABLET | Freq: Every day | ORAL | Status: DC
  Administered 2023-01-05 – 2023-01-10 (×6): 325 mg via ORAL
  Filled 2023-01-05 (×6): qty 1

## 2023-01-05 MED ORDER — ADULT MULTIVITAMIN W/MINERALS CH
1.0000 | ORAL_TABLET | Freq: Every day | ORAL | Status: DC
  Administered 2023-01-05 – 2023-01-10 (×6): 1 via ORAL
  Filled 2023-01-05 (×6): qty 1

## 2023-01-05 NOTE — Progress Notes (Signed)
PROGRESS NOTE    Lisa [name]  MWU:[id number] DOB: 02-11-1935 DOA: 12/31/2022 PCP: [name], MD    Brief Narrative:   88 year old female  with past medical history significant for HTN, CAD, COPD, lung CA status post radiation, CKD stage 3b, MGUS presented to the hospital with shortness of breath and chest tightness.  Patient admission x 2 for non-ST elevation MI when she had refused cardiac catheterization , managed medically.  On this admission, she was initially put on BiPAP for increased work of breathing.   EKG was significant for diffuse ST changes and cardiology was consulted. Nitroglycerin infusion was started for chest pain and blood pressure control. PCCM was asked to admit to ICU for hypertensive emergency.  Patient was subsequently weaned off nitroglycerin drip and was transferred out of the ICU.  Assessment and Plan:   Hypertensive Emergency:  Resolved.  Off nitroglycerin drip.  Currently on amlodipine and Coreg.   Blood pressure seems to be reasonably controlled.     Acute hypoxic respiratory failure in the setting of acute pulmonary edema Acute on chronic HFpEF Continue strict intake and output charting.  Has received diuretics with improvement.   On room air today and nasal canula has been weaned off.  Plan to restart p.o. Lasix on discharge. Resume po lasix from today. Has some dyspnea on exertion but has copd.   NSTEMI;  EKG showed ST and T wave changes with elevated troponin.  History of refusing cath in the past and refused at this time as well.  On aspirin Plavix Coreg and Crestor.  Ranexa has been added due to intermittent chest pain.  Cardiology followed the patient during hospitalization and will need outpatient follow-up.  No further chest pain.   Anemia of chronic kidney disease Patient received 1 unit of packed RBC for hemoglobin of 6.9 on 01/02/2023.  Hemoglobin today at 8.4.  No signs of bleeding.  Has mild thrombocytopenia , improving. Latest platelet  of 102  Acute kidney injury on stage 4 CKD:  Latest creatinine at 1.8 , from 2.0.  Stable at this time.  Continue to monitor.   Acute COPD exacerbation. Continue DuoNebs, incentive spirometry, on room air.    Thrombocytopenia:  Improving. No bleeding. .    DVT prophylaxis: Place and maintain sequential compression device Start: 01/03/23 1147SCD.   Code Status:     Code Status: DNR  Disposition: Skilled nursing facility as per PT OT evaluation likely on 01/05/2019. Medically stable for disposition.  Status is: Inpatient  Remains inpatient appropriate because: Pending skilled nursing facility placement,   Family Communication: Spoke with the patient's niece and HPOA on the phone and updated her about the clinical condition of the patient on 7/24.   Consultants:  Cardiology PCCM  Procedures:  Transfusion of 1 unit of packed RBC  Antimicrobials:  None   Subjective:  Today, patient was seen and examined at bedside. Feels ok, no dyspnea but has mild DOE, no chest pain.  Objective: Vitals:   01/04/23 2351 01/05/23 0552 01/05/23 0737 01/05/23 0930  BP: (!) 143/67 (!) 127/52 (!) 145/61   Pulse: (!) 54 (!) 59 62 (!) 56  Resp: 19 19 20    Temp: 97.8 F (36.6 C) 98.2 F (36.8 C) 98.2 F (36.8 C)   TempSrc: Oral Oral Oral   SpO2: 97%  96%   Weight:  46.6 kg      Intake/Output Summary (Last 24 hours) at 01/05/2023 1242 Last data filed at 01/05/2023 0913 Gross per  24 hour  Intake 720 ml  Output 500 ml  Net 220 ml   Filed Weights   01/03/23 0508 01/04/23 0510 01/05/23 0552  Weight: 47.3 kg 46.4 kg 46.6 kg    Physical Examination: Body mass index is 16.09 kg/m.   General: Thinly built, not in obvious distress, on nasal cannula oxygen HENT:   Pallor noted. Oral mucosa is moist.  Chest: .  Diminished breath sounds bilaterally.   CVS: S1 &S2 heard. No murmur.  Regular rate and rhythm. Abdomen: Soft, nontender, nondistended.  Bowel sounds are heard.   Extremities:  No cyanosis, clubbing or edema.   Psych: Alert, awake and oriented, normal mood CNS:  No cranial nerve deficits.  Moves all the extremities. Skin: Warm and dry.  No rashes noted.  Data Reviewed:   CBC: Recent Labs  Lab 01/01/23 0626 01/02/23 0128 01/02/23 1726 01/02/23 2348 01/04/23 0056 01/05/23 0019  WBC 5.3 8.0  --  5.6 4.4 3.8*  HGB 8.5* 6.9* 8.9* 8.1* 8.0* 8.4*  HCT 26.3* 21.4* 27.7* 25.2* 24.6* 25.8*  MCV 90.7 94.3  --  91.0 88.5 89.3  PLT 89* 83*  --  86* 90* 102*    Basic Metabolic Panel: Recent Labs  Lab 12/31/22 2012 12/31/22 2023 12/31/22 2224 01/01/23 0626 01/02/23 0128 01/02/23 2348 01/04/23 0056  NA 137   < > 139 137 136 136 135  K 4.6   < > 4.6 4.3 4.7 4.5 4.3  CL 102  --   --  106 104 107 104  CO2 21*  --   --  22 25 24 24   GLUCOSE 185*  --   --  141* 111* 80 80  BUN 69*  --   --  70* 77* 70* 61*  CREATININE 2.13*  --   --  2.17* 2.04* 1.81* 1.86*  CALCIUM 9.0  --   --  8.8* 8.4* 8.5* 8.4*  MG  --   --   --  2.2 2.0  --  2.1  PHOS  --   --   --  4.4 4.3  --   --    < > = values in this interval not displayed.    Liver Function Tests: No results for input(s): "AST", "ALT", "ALKPHOS", "BILITOT", "PROT", "ALBUMIN" in the last 168 hours.   Radiology Studies: No results found.    LOS: 5 days    Joycelyn Das, MD [facility] Available via Epic secure chat 7am-7pm After these hours, please refer to coverage provider listed on [email or web address] 01/05/2023, 12:42 PM

## 2023-01-05 NOTE — Plan of Care (Signed)

## 2023-01-05 NOTE — TOC Progression Note (Signed)
Transition of Care [facility]) - Progression Note    Patient Details  Name: Lisa [name] MRN: [id number] Date of Birth: May 27, 1935  Transition of Care [facility]) CM/SW Contact  Leander Rams, LCSW Phone Number: 01/05/2023, 1:50 PM  Clinical Narrative:    CSW spoke with pt daughter via phone and briefly discussed SNF bed offers. CSW visited pt at bedside and provided list of SNF bed offer.   TOC will continue to follow.    Expected Discharge Plan: Skilled Nursing Facility Barriers to Discharge: Continued Medical Work up  Expected Discharge Plan and Services       Living arrangements for the past 2 months: Single Family Home                             HH Agency: [facility]         Social Determinants of Health (SDOH) Interventions SDOH Screenings   Food Insecurity: Patient Declined (12/19/2022)  Housing: Low Risk  (12/19/2022)  Transportation Needs: No Transportation Needs (12/19/2022)  Utilities: Not At Risk (12/19/2022)  Alcohol Screen: Low Risk  (10/24/2022)  Depression (PHQ2-9): Low Risk  (12/12/2022)  Financial Resource Strain: Low Risk  (10/24/2022)  Physical Activity: Inactive (10/24/2022)  Social Connections: Moderately Isolated (12/01/2022)  Stress: No Stress Concern Present (12/01/2022)  Tobacco Use: Medium Risk (01/03/2023)    Readmission Risk Interventions    12/17/2022    3:16 PM 11/16/2022    1:13 PM 09/06/2021    9:23 AM  Readmission Risk Prevention Plan  Transportation Screening  Complete Complete  PCP or Specialist Appt within 3-5 Days   Complete  HRI or Home Care Consult  Complete Complete  Social Work Consult for Recovery Care Planning/Counseling  Complete Complete  Palliative Care Screening  Complete Not Applicable  Medication Review Oceanographer) Complete Complete Complete  PCP or Specialist appointment within 3-5 days of discharge Complete    Oletta Lamas, MSW, LCSWA, LCASA Transitions of Care  Clinical Social Worker I

## 2023-01-05 NOTE — Progress Notes (Signed)
Physical Therapy Treatment Patient Details Name: Lisa [name] MRN: [id number] DOB: 03/23/1935 Today's Date: 01/05/2023   History of Present Illness 87yo female wh presented to [facility] ED on 12/31/22 with SOB and chest tightness. Found to be in significant HTN and placed on BIPAP. EKG also with diffuse ST changes. Admitted to ICU due to hypertensive emergency. PMH anemia, carotid stenosis, CKD, COPD, hepatitis C, hx radiation therapy, HTN, MGUS    PT Comments  Upon PT arrival to room, pt requesting OOB for toileting. Pt overall requiring min assist for bed mobility, repeated transfers, and short-distance gait in room. Pt also requires pericare assist after toileting. VSS on RA throughout session. D/c plan remains appropriate.     Assistance Recommended at Discharge Frequent or constant Supervision/Assistance  If plan is discharge home, recommend the following:  Can travel by private vehicle    A little help with walking and/or transfers;Assistance with cooking/housework;Assist for transportation;A little help with bathing/dressing/bathroom;Help with stairs or ramp for entrance      Equipment Recommendations  Wheelchair (measurements PT);Wheelchair cushion (measurements PT)    Recommendations for Other Services       Precautions / Restrictions Precautions Precautions: Fall;Other (comment) Precaution Comments: watch O2 - RA this date Restrictions Weight Bearing Restrictions: No     Mobility  Bed Mobility Overal bed mobility: Needs Assistance Bed Mobility: Supine to Sit     Supine to sit: Min assist     General bed mobility comments: light assist for trunk elevation    Transfers Overall transfer level: Needs assistance Equipment used: Rolling walker (2 wheels) Transfers: Sit to/from Stand Sit to Stand: Min assist           General transfer comment: light rise and steady assist, x2 from EOB and BSC    Ambulation/Gait Ambulation/Gait assistance: Min assist Gait  Distance (Feet): 15 Feet Assistive device: Rolling walker (2 wheels) Gait Pattern/deviations: Step-through pattern, Ataxic, Drifts right/left, Trunk flexed, Narrow base of support Gait velocity: decr     General Gait Details: assist to steady pt and RW, cues for slowing speed and placement in RW   Stairs             Wheelchair Mobility     Tilt Bed    Modified Rankin (Stroke Patients Only)       Balance Overall balance assessment: Needs assistance Sitting-balance support: No upper extremity supported, Feet supported Sitting balance-Leahy Scale: Good     Standing balance support: Bilateral upper extremity supported, During functional activity, Reliant on assistive device for balance Standing balance-Leahy Scale: Poor Standing balance comment: reliant on RW                            Cognition Arousal/Alertness: Awake/alert Behavior During Therapy: WFL for tasks assessed/performed Overall Cognitive Status: Within Functional Limits for tasks assessed                                          Exercises Other Exercises Other Exercises: sit<>stands x5 from recliner    General Comments General comments (skin integrity, edema, etc.): SPO2 94% on RA, HR stable      Pertinent Vitals/Pain Pain Assessment Pain Assessment: Faces Faces Pain Scale: No hurt Pain Intervention(s): Monitored during session    Home Living  Prior Function            PT Goals (current goals can now be found in the care plan section) Acute Rehab PT Goals Patient Stated Goal: get better Time For Goal Achievement: 01/16/23 Potential to Achieve Goals: Good Progress towards PT goals: Progressing toward goals    Frequency    Min 1X/week      PT Plan Current plan remains appropriate    Co-evaluation              AM-PAC PT "6 Clicks" Mobility   Outcome Measure  Help needed turning from your back to your side  while in a flat bed without using bedrails?: A Little Help needed moving from lying on your back to sitting on the side of a flat bed without using bedrails?: A Little Help needed moving to and from a bed to a chair (including a wheelchair)?: A Little Help needed standing up from a chair using your arms (e.g., wheelchair or bedside chair)?: A Little Help needed to walk in hospital room?: A Lot Help needed climbing 3-5 steps with a railing? : A Lot 6 Click Score: 16    End of Session   Activity Tolerance: Patient tolerated treatment well Patient left: in chair;with call bell/phone within reach;with chair alarm set;with nursing/sitter in room Nurse Communication: Mobility status PT Visit Diagnosis: Unsteadiness on feet (R26.81);Muscle weakness (generalized) (M62.81);Other abnormalities of gait and mobility (R26.89);Ataxic gait (R26.0);Difficulty in walking, not elsewhere classified (R26.2)     Time: 1610-9604 PT Time Calculation (min) (ACUTE ONLY): 14 min  Charges:    $Therapeutic Activity: 8-22 mins PT General Charges $$ ACUTE PT VISIT: 1 Visit                     Marye Round, PT DPT Acute Rehabilitation Services Secure Chat Preferred  Office [phone]    Lisa [name] 01/05/2023, 10:21 AM

## 2023-01-06 DIAGNOSIS — J441 Chronic obstructive pulmonary disease with (acute) exacerbation: Secondary | ICD-10-CM | POA: Diagnosis not present

## 2023-01-06 DIAGNOSIS — J438 Other emphysema: Secondary | ICD-10-CM | POA: Diagnosis not present

## 2023-01-06 DIAGNOSIS — J9601 Acute respiratory failure with hypoxia: Secondary | ICD-10-CM | POA: Diagnosis not present

## 2023-01-06 DIAGNOSIS — I161 Hypertensive emergency: Secondary | ICD-10-CM | POA: Diagnosis not present

## 2023-01-06 NOTE — Plan of Care (Signed)

## 2023-01-06 NOTE — Progress Notes (Signed)
PROGRESS NOTE    Sharrel Frock  OAC:[id number] DOB: 07/25/1934 DOA: 12/31/2022 PCP: [name], MD    Brief Narrative:   88 year old female  with past medical history significant for HTN, CAD, COPD, lung CA status post radiation, CKD stage 3b, MGUS presented to the hospital with shortness of breath and chest tightness.  Patient admission x 2 for non-ST elevation MI when she had refused cardiac catheterization , managed medically.  On this admission, she was initially put on BiPAP for increased work of breathing.   EKG was significant for diffuse ST changes and cardiology was consulted. Nitroglycerin infusion was started for chest pain and blood pressure control. PCCM was asked to admit to ICU for hypertensive emergency.  Patient was subsequently weaned off nitroglycerin drip and was transferred out of the ICU.  Assessment and Plan:   Hypertensive Emergency:  Resolved.  Off nitroglycerin drip.  Currently on amlodipine and Coreg.   Blood pressure seems to be controlled.  Continue current medications.   Acute hypoxic respiratory failure in the setting of acute pulmonary edema Acute on chronic HFpEF Continue strict intake and output charting.  On room air.  Resumed p.o. Lasix since yesterday.  NSTEMI;  EKG showed ST and T wave changes with elevated troponin.  History of refusing cath in the past and refused at this time as well.  On aspirin Plavix, Coreg and Crestor.  Ranexa has been added due to intermittent chest pain.  Cardiology followed the patient during hospitalization and will need outpatient follow-up.  No further chest pain.   Anemia of chronic kidney disease Patient received 1 unit of packed RBC for hemoglobin of 6.9 on 01/02/2023.  Latest hemoglobin at 8.4.  No signs of bleeding.  Has mild thrombocytopenia , improving. Latest platelet of 102  Acute kidney injury on stage 4 CKD:  Latest creatinine at 1.8 , from 2.0.  Stable at this time.  Continue to monitor.   Acute  COPD exacerbation. Continue DuoNebs, incentive spirometry, on room air.    Thrombocytopenia:  Improving. No bleeding. .    DVT prophylaxis: Place and maintain sequential compression device Start: 01/03/23 1147SCD.   Code Status:     Code Status: DNR  Disposition:  Skilled nursing facility when bed available. Medically stable for disposition.  Status is: Inpatient  Remains inpatient appropriate because: Pending skilled nursing facility placement,   Family Communication: Spoke with the patient's niece and HPOA on the phone and updated her about the clinical condition of the patient on 01/02/25   Consultants:  Cardiology PCCM  Procedures:  Transfusion of 1 unit of packed RBC  Antimicrobials:  None   Subjective:  Today, patient was seen and examined at bedside.  Denies interval complaints.  Denies any chest pain, cough but has baseline dyspnea on exertion.  No chest pain..  Objective: Vitals:   01/06/23 0002 01/06/23 0502 01/06/23 0742 01/06/23 0917  BP: 108/83 (!) 151/52  (!) 134/54  Pulse: (!) 52 (!) 55 63 60  Resp:   18   Temp: 98.2 F (36.8 C) 98.8 F (37.1 C) 98.1 F (36.7 C)   TempSrc: Oral Oral Oral   SpO2: 97% 96% 97%   Weight:  48 kg      Intake/Output Summary (Last 24 hours) at 01/06/2023 1127 Last data filed at 01/05/2023 2203 Gross per 24 hour  Intake 200 ml  Output --  Net 200 ml   Filed Weights   01/04/23 0510 01/05/23 0552 01/06/23 0502  Weight:  46.4 kg 46.6 kg 48 kg    Physical Examination: Body mass index is 16.57 kg/m.   General: Thinly built, not in obvious distress, on nasal cannula oxygen, elderly female. HENT:   Pallor noted. Oral mucosa is moist.  Chest: .  Diminished breath sounds bilaterally.  No obvious wheezes or crackles. CVS: S1 &S2 heard. No murmur.  Regular rate and rhythm. Abdomen: Soft, nontender, nondistended.  Bowel sounds are heard.   Extremities: No cyanosis, clubbing or edema.   Psych: Alert, awake and oriented,  normal mood CNS:  No cranial nerve deficits.  Moves all the extremities. Skin: Warm and dry.  No rashes noted.  Data Reviewed:   CBC: Recent Labs  Lab 01/01/23 0626 01/02/23 0128 01/02/23 1726 01/02/23 2348 01/04/23 0056 01/05/23 0019  WBC 5.3 8.0  --  5.6 4.4 3.8*  HGB 8.5* 6.9* 8.9* 8.1* 8.0* 8.4*  HCT 26.3* 21.4* 27.7* 25.2* 24.6* 25.8*  MCV 90.7 94.3  --  91.0 88.5 89.3  PLT 89* 83*  --  86* 90* 102*    Basic Metabolic Panel: Recent Labs  Lab 12/31/22 2012 12/31/22 2023 12/31/22 2224 01/01/23 0626 01/02/23 0128 01/02/23 2348 01/04/23 0056  NA 137   < > 139 137 136 136 135  K 4.6   < > 4.6 4.3 4.7 4.5 4.3  CL 102  --   --  106 104 107 104  CO2 21*  --   --  22 25 24 24   GLUCOSE 185*  --   --  141* 111* 80 80  BUN 69*  --   --  70* 77* 70* 61*  CREATININE 2.13*  --   --  2.17* 2.04* 1.81* 1.86*  CALCIUM 9.0  --   --  8.8* 8.4* 8.5* 8.4*  MG  --   --   --  2.2 2.0  --  2.1  PHOS  --   --   --  4.4 4.3  --   --    < > = values in this interval not displayed.    Liver Function Tests: No results for input(s): "AST", "ALT", "ALKPHOS", "BILITOT", "PROT", "ALBUMIN" in the last 168 hours.   Radiology Studies: No results found.    LOS: 6 days    Joycelyn Das, MD [facility] Available via Epic secure chat 7am-7pm After these hours, please refer to coverage provider listed on [email or web address] 01/06/2023, 11:27 AM

## 2023-01-06 NOTE — Plan of Care (Signed)
  Problem: Education: Goal: Knowledge of General Education information will improve Description Including pain rating scale, medication(s)/side effects and non-pharmacologic comfort measures Outcome: Progressing   

## 2023-01-06 NOTE — TOC Progression Note (Signed)
Transition of Care [facility]) - Progression Note    Patient Details  Name: Lisa [name] MRN: [id number] Date of Birth: August 24, 1934  Transition [facility]) CM/SW Contact  Helene Kelp, [place] Phone Number: 01/06/2023, 3:56 PM  Clinical Narrative:    CSW followed-up the patient's disposition (SNF placement).  This Clinical research associate met with the pt and her niece Lisa [name] [phone]) at the bedside. This Clinical research associate processed with the patient regarding her choice of SNF and the process of discharge SNF transfer.   The patient expressed preference for SNF placement [facility]). Bronson Ing noted she is working with the family regarding [facility] documents at the discretion of the patient, which the patient has confirmed. Bronson Ing stated she is also in favor the selected SNF for the patient and would like to be the point of contact regarding updates for the patient's disposition.   CSW attempted to make contact with [facility] to identify bed placement/offer (since in the referral destination tab, notes the patient is being considered by the SNF). This Clinical research associate was unable tom make contact with the SNF facility based on several telephone attempts.   This writer has updated the CN to the efforts made and noted, this Clinical research associate will be the CSW to follow the patient's disposition the following day (7/28/224).   TOC CSW/CM FOLLOW-UP  on the status of the insurance authorization which has been initiated. Check with the SNF facility to review the referral for SNF placement.  Follow-up with the clinical team regarding efforts to place patient.  Follow-up with the patient and Bronson Ing (niece) to update efforts regarding SNF placement.   No other needs identified of this Clinical research associate. Please contact TOC if there is additional disposition support needed.   Expected Discharge Plan: Skilled Nursing Facility Barriers to Discharge: Continued Medical Work up  Expected Discharge Plan and Services       Living arrangements for  the past 2 months: Single Family Home                             HH Agency: [facility]         Social Determinants of Health (SDOH) Interventions SDOH Screenings   Food Insecurity: Patient Declined (12/19/2022)  Housing: Low Risk  (12/19/2022)  Transportation Needs: No Transportation Needs (12/19/2022)  Utilities: Not At Risk (12/19/2022)  Alcohol Screen: Low Risk  (10/24/2022)  Depression (PHQ2-9): Low Risk  (12/12/2022)  Financial Resource Strain: Low Risk  (10/24/2022)  Physical Activity: Inactive (10/24/2022)  Social Connections: Moderately Isolated (12/01/2022)  Stress: No Stress Concern Present (12/01/2022)  Tobacco Use: Medium Risk (01/03/2023)    Readmission Risk Interventions    12/17/2022    3:16 PM 11/16/2022    1:13 PM 09/06/2021    9:23 AM  Readmission Risk Prevention Plan  Transportation Screening  Complete Complete  PCP or Specialist Appt within 3-5 Days   Complete  HRI or Home Care Consult  Complete Complete  Social Work Consult for Recovery Care Planning/Counseling  Complete Complete  Palliative Care Screening  Complete Not Applicable  Medication Review Oceanographer) Complete Complete Complete  PCP or Specialist appointment within 3-5 days of discharge Complete

## 2023-01-06 NOTE — TOC Progression Note (Signed)
Transition of Care [facility]) - Progression Note    Patient Details  Name: Lisa [name] MRN: [id number] Date of Birth: 1935-05-10  Transition of Care [facility]) CM/SW Contact  Mearl Latin, LCSW Phone Number: 01/06/2023, 1:31 PM  Clinical Narrative:    Per unit CSW, patient and family have selected [facility]. CSW initiated insurance authorization process, Ref# [id number].   Expected Discharge Plan: Skilled Nursing Facility Barriers to Discharge: Continued Medical Work up  Expected Discharge Plan and Services       Living arrangements for the past 2 months: Single Family Home                             HH Agency: [facility]         Social Determinants of Health (SDOH) Interventions SDOH Screenings   Food Insecurity: Patient Declined (12/19/2022)  Housing: Low Risk  (12/19/2022)  Transportation Needs: No Transportation Needs (12/19/2022)  Utilities: Not At Risk (12/19/2022)  Alcohol Screen: Low Risk  (10/24/2022)  Depression (PHQ2-9): Low Risk  (12/12/2022)  Financial Resource Strain: Low Risk  (10/24/2022)  Physical Activity: Inactive (10/24/2022)  Social Connections: Moderately Isolated (12/01/2022)  Stress: No Stress Concern Present (12/01/2022)  Tobacco Use: Medium Risk (01/03/2023)    Readmission Risk Interventions    12/17/2022    3:16 PM 11/16/2022    1:13 PM 09/06/2021    9:23 AM  Readmission Risk Prevention Plan  Transportation Screening  Complete Complete  PCP or Specialist Appt within 3-5 Days   Complete  HRI or Home Care Consult  Complete Complete  Social Work Consult for Recovery Care Planning/Counseling  Complete Complete  Palliative Care Screening  Complete Not Applicable  Medication Review Oceanographer) Complete Complete Complete  PCP or Specialist appointment within 3-5 days of discharge Complete

## 2023-01-07 DIAGNOSIS — J441 Chronic obstructive pulmonary disease with (acute) exacerbation: Secondary | ICD-10-CM | POA: Diagnosis not present

## 2023-01-07 DIAGNOSIS — J9601 Acute respiratory failure with hypoxia: Secondary | ICD-10-CM | POA: Diagnosis not present

## 2023-01-07 DIAGNOSIS — J438 Other emphysema: Secondary | ICD-10-CM | POA: Diagnosis not present

## 2023-01-07 DIAGNOSIS — I161 Hypertensive emergency: Secondary | ICD-10-CM | POA: Diagnosis not present

## 2023-01-07 LAB — BASIC METABOLIC PANEL WITH GFR
Anion gap: 5 (ref 5–15)
BUN: 47 mg/dL — ABNORMAL HIGH (ref 8–23)
CO2: 25 mmol/L (ref 22–32)
Calcium: 8.2 mg/dL — ABNORMAL LOW (ref 8.9–10.3)
Chloride: 102 mmol/L (ref 98–111)
Creatinine, Ser: 2.03 mg/dL — ABNORMAL HIGH (ref 0.44–1.00)
GFR, Estimated: 24 mL/min — ABNORMAL LOW (ref >60)
Glucose, Bld: 90 mg/dL (ref 70–99)
Potassium: 4.5 mmol/L (ref 3.5–5.1)
Sodium: 132 mmol/L — ABNORMAL LOW (ref 135–145)

## 2023-01-07 LAB — CBC
HCT: 24.8 % — ABNORMAL LOW (ref 36.0–46.0)
Hemoglobin: 8.1 g/dL — ABNORMAL LOW (ref 12.0–15.0)
MCH: 28.9 pg (ref 26.0–34.0)
MCHC: 32.7 g/dL (ref 30.0–36.0)
MCV: 88.6 fL (ref 80.0–100.0)
Platelets: 105 10*3/uL — ABNORMAL LOW (ref 150–400)
RBC: 2.8 MIL/uL — ABNORMAL LOW (ref 3.87–5.11)
RDW: 17.2 % — ABNORMAL HIGH (ref 11.5–15.5)
WBC: 4.1 10*3/uL (ref 4.0–10.5)
nRBC: 0 % (ref 0.0–0.2)

## 2023-01-07 NOTE — Plan of Care (Signed)
  Problem: Education: Goal: Knowledge of General Education information will improve Description Including pain rating scale, medication(s)/side effects and non-pharmacologic comfort measures Outcome: Progressing   

## 2023-01-07 NOTE — Progress Notes (Signed)
PROGRESS NOTE    Lisa [name]  OVF:[id number] DOB: [birth year] DOA: 12/31/2022 PCP: [name], MD    Brief Narrative:   88 year old female  with past medical history significant for HTN, CAD, COPD, lung CA status post radiation, CKD stage 3b, MGUS presented to the hospital with shortness of breath and chest tightness.  Patient admission x 2 for non-ST elevation MI when she had refused cardiac catheterization , managed medically.  On this admission, she was initially put on BiPAP for increased work of breathing.   EKG was significant for diffuse ST changes and cardiology was consulted. Nitroglycerin infusion was started for chest pain and blood pressure control. PCCM was asked to admit to ICU for hypertensive emergency.  Patient was subsequently weaned off nitroglycerin drip and was transferred out of the ICU.  Assessment and Plan:   Hypertensive Emergency:  Resolved.  Blood pressure stable on on amlodipine and Coreg.   Acute hypoxic respiratory failure in the setting of acute pulmonary edema Acute on chronic HFpEF Improved at this time.  Continue strict intake and output charting.  On room air.  Continue p.o. Lasix since yesterday.  NSTEMI;  EKG showed ST and T wave changes with elevated troponin.  History of refusing cath in the past and refused at this time as well.  Continue  aspirin, Plavix, Coreg and Crestor, Ranexa. Cardiology followed the patient during hospitalization and will need outpatient follow-up.  No further chest pain.   Anemia of chronic kidney disease Patient received 1 unit of packed RBC for hemoglobin of 6.9 on 01/02/2023.  Latest hemoglobin at 8.1.  No signs of bleeding.  Has mild thrombocytopenia , improving. Latest platelet of 102  Acute kidney injury on stage 4 CKD:  Latest creatinine at 2.0 , from 2.0.  Stable at this time.  Continue to monitor.   Acute COPD exacerbation. Continue DuoNebs, incentive spirometry, on room air.    Thrombocytopenia:   Improving. No bleeding. .    DVT prophylaxis: Place and maintain sequential compression device Start: 01/03/23 1147SCD.   Code Status:     Code Status: DNR  Disposition:  Skilled nursing facility when bed available. Medically stable for disposition.  Status is: Inpatient  Remains inpatient appropriate because: Pending skilled nursing facility placement,   Family Communication: Spoke with the patient's niece and HPOA on the phone and updated her about the clinical condition of the patient on 01/02/25   Consultants:  Cardiology PCCM  Procedures:  Transfusion of 1 unit of packed RBC  Antimicrobials:  None   Subjective:  Today, patient was seen and examined at bedside.  Patient denies any chest pain, dyspnea, shortness of breath or cough.  Awaiting for skilled nursing facility.  Objective: Vitals:   01/07/23 0409 01/07/23 0710 01/07/23 0917 01/07/23 1151  BP: (!) 136/48 137/60 (!) 152/45 (!) 135/53  Pulse: (!) 53 (!) 58 (!) 55 (!) 56  Resp: 18 18  18   Temp: 98 F (36.7 C) 98.1 F (36.7 C)  97.9 F (36.6 C)  TempSrc: Oral Oral  Oral  SpO2: 95% 93%  94%  Weight: 46.4 kg       Intake/Output Summary (Last 24 hours) at 01/07/2023 1443 Last data filed at 01/07/2023 1300 Gross per 24 hour  Intake 430 ml  Output 600 ml  Net -170 ml   Filed Weights   01/05/23 0552 01/06/23 0502 01/07/23 0409  Weight: 46.6 kg 48 kg 46.4 kg    Physical Examination: Body mass index is  16.02 kg/m.   General: Thinly built, not in obvious distress, on nasal cannula oxygen, elderly female. HENT:   Pallor noted. Oral mucosa is moist.  Chest: .  Diminished breath sounds bilaterally.  Clear breath sounds.   CVS: S1 &S2 heard. No murmur.  Regular rate and rhythm. Abdomen: Soft, nontender, nondistended.  Bowel sounds are heard.   Extremities: No cyanosis, clubbing or edema.   Psych: Alert, awake and oriented, normal mood CNS:  No cranial nerve deficits.  Nonfocal. Skin: Warm and dry.  No  rashes noted.  Data Reviewed:   CBC: Recent Labs  Lab 01/02/23 0128 01/02/23 1726 01/02/23 2348 01/04/23 0056 01/05/23 0019 01/07/23 0108  WBC 8.0  --  5.6 4.4 3.8* 4.1  HGB 6.9* 8.9* 8.1* 8.0* 8.4* 8.1*  HCT 21.4* 27.7* 25.2* 24.6* 25.8* 24.8*  MCV 94.3  --  91.0 88.5 89.3 88.6  PLT 83*  --  86* 90* 102* 105*    Basic Metabolic Panel: Recent Labs  Lab 01/01/23 0626 01/02/23 0128 01/02/23 2348 01/04/23 0056 01/07/23 0108  NA 137 136 136 135 132*  K 4.3 4.7 4.5 4.3 4.5  CL 106 104 107 104 102  CO2 22 25 24 24 25   GLUCOSE 141* 111* 80 80 90  BUN 70* 77* 70* 61* 47*  CREATININE 2.17* 2.04* 1.81* 1.86* 2.03*  CALCIUM 8.8* 8.4* 8.5* 8.4* 8.2*  MG 2.2 2.0  --  2.1  --   PHOS 4.4 4.3  --   --   --     Liver Function Tests: No results for input(s): "AST", "ALT", "ALKPHOS", "BILITOT", "PROT", "ALBUMIN" in the last 168 hours.   Radiology Studies: No results found.    LOS: 7 days    Joycelyn Das, MD [facility] Available via Epic secure chat 7am-7pm After these hours, please refer to coverage provider listed on [email or web address] 01/07/2023, 2:43 PM

## 2023-01-07 NOTE — Plan of Care (Signed)
  Problem: Education: Goal: Knowledge of General Education information will improve Description: Including pain rating scale, medication(s)/side effects and non-pharmacologic comfort measures 01/07/2023 1901 by Zada Finders, RN Outcome: Progressing 01/07/2023 1901 by Zada Finders, RN Outcome: Progressing   Problem: Health Behavior/Discharge Planning: Goal: Ability to manage health-related needs will improve 01/07/2023 1901 by Zada Finders, RN Outcome: Progressing 01/07/2023 1901 by Lorrin Jackson D, RN Outcome: Progressing   Problem: Clinical Measurements: Goal: Ability to maintain clinical measurements within normal limits will improve 01/07/2023 1901 by Lorrin Jackson D, RN Outcome: Progressing 01/07/2023 1901 by Zada Finders, RN Outcome: Progressing

## 2023-01-07 NOTE — TOC Progression Note (Addendum)
Transition of Care [facility]) - Progression Note    Patient Details  Name: Lisa [name] MRN: [id number] Date of Birth: 14-Oct-1934  Transition of Care [facility]) CM/SW Contact  Helene Kelp, [place] Phone Number: 01/07/2023, 3:42 PM  Clinical Narrative:    CSW followed-up the patient's disposition (SNF placement).  CSW checked and contacted [facility] to check the status of the preferred option made by the patient and family (niece-Yvette). This writer was unable to make contact with a representative.  CSW followed up with the niece [name]) regarding SNF placement efforts. This Clinical research associate updated Bronson Ing, other bed-offers were made and available for the patient if [facility] does make a bed-offer to the patient. Bronson Ing expressed still wanting to go forward with [facility].   CSW met with the patient at the bedside and updated her to the information noted above. The patient noted she still prefers [facility], and until they say no to her referral, the other bed-offer options are not considered.    TOC CSW/CM FOLLOW-UP  Check on the status of the insurance authorization which has been initiated. Check with the SNF facility to review the referral for SNF placement.  Follow-up with the clinical team regarding efforts to place patient.  Follow-up with the patient and Bronson Ing (niece) to update efforts regarding SNF placement.     Expected Discharge Plan: Skilled Nursing Facility Barriers to Discharge: Continued Medical Work up  Expected Discharge Plan and Services       Living arrangements for the past 2 months: Single Family Home                             HH Agency: [facility]         Social Determinants of Health (SDOH) Interventions SDOH Screenings   Food Insecurity: Patient Declined (12/19/2022)  Housing: Low Risk  (12/19/2022)  Transportation Needs: No Transportation Needs (12/19/2022)  Utilities: Not At Risk (12/19/2022)  Alcohol Screen: Low Risk   (10/24/2022)  Depression (PHQ2-9): Low Risk  (12/12/2022)  Financial Resource Strain: Low Risk  (10/24/2022)  Physical Activity: Inactive (10/24/2022)  Social Connections: Moderately Isolated (12/01/2022)  Stress: No Stress Concern Present (12/01/2022)  Tobacco Use: Medium Risk (01/03/2023)    Readmission Risk Interventions    12/17/2022    3:16 PM 11/16/2022    1:13 PM 09/06/2021    9:23 AM  Readmission Risk Prevention Plan  Transportation Screening  Complete Complete  PCP or Specialist Appt within 3-5 Days   Complete  HRI or Home Care Consult  Complete Complete  Social Work Consult for Recovery Care Planning/Counseling  Complete Complete  Palliative Care Screening  Complete Not Applicable  Medication Review Oceanographer) Complete Complete Complete  PCP or Specialist appointment within 3-5 days of discharge Complete

## 2023-01-07 NOTE — Plan of Care (Signed)

## 2023-01-08 ENCOUNTER — Ambulatory Visit: Payer: Self-pay | Admitting: *Deleted

## 2023-01-08 ENCOUNTER — Ambulatory Visit: Payer: Medicare Other | Admitting: Internal Medicine

## 2023-01-08 ENCOUNTER — Encounter: Payer: Self-pay | Admitting: *Deleted

## 2023-01-08 DIAGNOSIS — J9601 Acute respiratory failure with hypoxia: Secondary | ICD-10-CM | POA: Diagnosis not present

## 2023-01-08 DIAGNOSIS — J438 Other emphysema: Secondary | ICD-10-CM | POA: Diagnosis not present

## 2023-01-08 DIAGNOSIS — J441 Chronic obstructive pulmonary disease with (acute) exacerbation: Secondary | ICD-10-CM | POA: Diagnosis not present

## 2023-01-08 DIAGNOSIS — I161 Hypertensive emergency: Secondary | ICD-10-CM | POA: Diagnosis not present

## 2023-01-08 NOTE — Progress Notes (Signed)
   01/08/23 1546  Spiritual Encounters  Type of Visit Initial  Care provided to: Pt and family  Conversation partners present during encounter Nurse  Referral source Patient request  Reason for visit Advance directives  OnCall Visit No   Chaplain responded to call from floor that Pt was looking for ACD.  Chaplain arrived in the room and delivered the education.  Chaplain was alos asked to communicate with Pt's niece, and with her help we filled out the [facility], had it notarized, and filed the documents in the appropriate place.

## 2023-01-08 NOTE — Patient Instructions (Signed)
Visit Information  Thank you for taking time to visit with me today. Please don't hesitate to contact me if I can be of assistance to you.   Following are the goals we discussed today:   Goals Addressed             This Visit's Progress    Find Help in My Community.   On track    Care Coordination Interventions:  Interventions Today    Flowsheet Row Most Recent Value  Chronic Disease   Chronic disease during today's visit Chronic Obstructive Pulmonary Disease (COPD), Chronic Kidney Disease/End Stage Renal Disease (ESRD), Hypertension (HTN), Other  [Tobacco Abuse]  General Interventions   General Interventions Discussed/Reviewed --  [Encouraged]  Labs Hgb A1c annually  [Encouraged]  Vaccines COVID-19, Flu, Pneumonia, RSV, Shingles, Tetanus/Pertussis/Diphtheria  [Encouraged]  Doctor Visits Discussed/Reviewed Doctor Visits Discussed, Doctor Visits Reviewed, Annual Wellness Visits, PCP, Specialist  [Encouraged]  Health Screening Bone Density, Colonoscopy, Mammogram  [Encouraged]  PCP/Specialist Visits Compliance with follow-up visit  [Encouraged]  Communication with PCP/Specialists  [Encouraged]  Level of Care Adult Daycare, Personal Care Services, Applications, Assisted Living, Skilled Nursing Facility  [Encouraged]  Applications Medicaid, Personal Care Services, FL-2  [Encouraged]  Exercise Interventions   Exercise Discussed/Reviewed Exercise Discussed, Assistive device use and maintanence, Exercise Reviewed, Physical Activity, Weight Managment  [Encouraged]  Physical Activity Discussed/Reviewed Physical Activity Discussed, Physical Activity Reviewed, Home Exercise Program (HEP), Types of exercise  [Encouraged]  Weight Management Weight maintenance  [Encouraged]  Education Interventions   Education Provided Provided Therapist, sports, Provided Web-based Education, Provided Education  Provided Verbal Education On Nutrition, Mental Health/Coping with Illness, When to see the doctor,  Foot Care, Eye Care, Applications, Labs, Exercise, Medication, Blood Sugar Monitoring, Programmer, applications, Nurse, mental health, Personal Care Services, FL-2  [Encouraged]  Mental Health Interventions   Mental Health Discussed/Reviewed Mental Health Discussed, Anxiety, Depression, Grief and Loss, Mental Health Reviewed, Substance Abuse, Coping Strategies, Crisis, Other, Suicide  [Domestic Violence]  Nutrition Interventions   Nutrition Discussed/Reviewed Nutrition Discussed, Adding fruits and vegetables, Increaing proteins, Decreasing fats, Decreasing salt, Supplmental nutrition, Decreasing sugar intake, Portion sizes, Fluid intake, Nutrition Reviewed, Carbohydrate meal planning  [Encouraged]  Pharmacy Interventions   Pharmacy Dicussed/Reviewed Pharmacy Topics Discussed, Medications and their functions, Medication Adherence, Pharmacy Topics Reviewed, Affording Medications  [Encouraged]  Safety Interventions   Safety Discussed/Reviewed Safety Discussed, Safety Reviewed, Home Safety  [Encouraged]  Home Safety Assistive Devices, Need for home safety assessment, Refer for community resources  [Encouraged]  Advanced Directive Interventions   Advanced Directives Discussed/Reviewed Advanced Directives Discussed  [Advanced Directives Completed]      Active Listening & Reflection Utilized.  Verbalization of Feelings Encouraged.  Emotional Support Provided. Feelings of Caregiver Burnout & Fatigue Validated. Symptoms of Caregiver Stress & Anxiety Acknowledged. Problem Solving Interventions Employed. Task-Centered Solutions Indicated.   Solution-Focused Strategies Activated. Acceptance & Commitment Therapy Performed. Cognitive Behavioral Therapy Initiated. Client-Centered Therapy Implemented. CSW Collaboration with Edd Arbour, Nurse Case Manager with [facility] Management & Niece, [name], [facility] [employer], to Address Concerns  Pertaining to Skilled Nursing Facility Placement, Completion of Advanced Directives, & Referral for Palliative Care Services. CSW Collaboration with Niece, [name] to Confirm Patient's Continued Stay at [facility] & Agreement to Skilled Nursing Facility Placement for Short-Term Rehabilitative Services. CSW Collaboration with Niece, [name] to Confirm Interest in Skilled Nursing Facility Placement for Patient at [facility], to Receive Short-Term  Rehabilitative Services. CSW Collaboration with Dr. Christel Mormon, Primary Care Provider with [facility] Primary [facility], to Express Patient & Niece, Bronson Ing Moore's Interest in His Continued Involvement with Her Care, Even While Residing at [facility]. CSW Collaboration with Niece, [name] to [employer] Completion of Advance Directives (Living Will & Healthcare Power of Attorney Documents), While Patient Remains Hospitalized at [facility]. CSW Collaboration with Niece, [name] to Encourage Involvement with [facility] Velna Hatchet, to Assist with Completion of [employer].  CSW Collaboration with Niece, [name] to Confirm Agreement in Referral for Palliative Care Services, through [facility] [phone]). CSW Collaboration with Niece, [name] to [employer] with CSW [phone]), if She Has Questions, Needs Assistance, or If Additional Social Work Needs Are Identified Between Now & Our Next Scheduled Follow-Up Outreach Call.      Our next appointment is by telephone on 02/05/2023 at 1:30 pm.  Please call the care guide team at [phone] if you need to cancel or reschedule your appointment.   If you are experiencing a Mental Health or Behavioral Health Crisis or need someone to talk to, please call the Suicide and Crisis Lifeline: 988 call the [place] National Suicide Prevention Lifeline: 718-086-2018  or TTY: (347)546-0892 TTY 270-353-6220) to talk to a trained counselor call 1-800-273-TALK (toll free, 24 hour hotline) go to [facility] [address] [place] [phone]) call the [facility] Crisis Line: ([phone] call 911  Patient verbalizes understanding of instructions and care plan provided today and agrees to view in MyChart. Active MyChart status and patient understanding of how to access instructions and care plan via MyChart confirmed with patient.     Telephone follow up appointment with care management team member scheduled for:  02/05/2023 at 1:30 pm.  Danford Bad, BSW, MSW, LCSW  Licensed Clinical Social Worker  [facility]  Mailing [place]. [address] [place], [place] [address] Physical Address[address], [place], [place] [address] Toll Free Main # [phone] Fax # [phone] Cell # [phone] [email or web address]

## 2023-01-08 NOTE — Plan of Care (Signed)

## 2023-01-08 NOTE — Patient Outreach (Addendum)
  Care Coordination   Follow Up Visit Note   01/08/2023 Name: Lisa [name] MRN: [id number] DOB: Aug 15, 1934  [name] Rakeisha [name] is a 88 y.o. year old female who sees Durwin Nora, Lucina Mellow, MD for primary care. I  collaborated with [place], Niece, [name] [place] and [place] SW related to progression and resources for patient  What matters to the patients health and wellness today?  Remains inpatient at [facility] with [place] [facility] placement.  Conference call with niece and [name] + Secure chat with Hospital SW [place].  Reviewed RN CM's in person meeting with Dr Durwin Nora on 01/04/23 & status of potential family conference related to patient. Dr Durwin Nora confirmed he has spoken with Bronson Ing and questions answered.  Bronson Ing made aware of facility MD services and palliative care role.  Bronson Ing voiced understanding of all services & that [facility] services stop once patient begins to receive services from the skilled nursing care management and social worker.  Bronson Ing to work with an Inpatient Chaplin on [place] status/documents for patient and outreach to [facility] RN [employer] & SW as needed.   Goals Addressed             This Visit's Progress    [facility] care coordination services (admissions, hypertensive urgency, Respiratory failure))       Interventions Today    Flowsheet Row Most Recent Value  Chronic Disease   Chronic disease during today's visit Other  General Interventions   General Interventions Discussed/Reviewed General Interventions Reviewed, [employer], Doctor Visits, Communication with, Level of Care  Doctor Visits Discussed/Reviewed Doctor Visits Reviewed, PCP, Specialist  PCP/Specialist Visits Compliance with follow-up visit  Communication with Social Work  Level of Care Skilled Nursing Facility  Education Interventions   Education Provided Provided Education  [POA services via hospital chaplin while inpatient, services at [employer] facility, pcp role after in facility, facility  MD services, palliative care services, [facility] services]  Provided Verbal Education On [employer], Other  Mental Health Interventions   Mental Health Discussed/Reviewed Mental Health Reviewed, Coping Strategies, Other  [confirmed patient preference to go to nursing facility, palliative care services and goals of care]  Advanced Directive Interventions   Advanced Directives Discussed/Reviewed Advanced Directives Reviewed, Advanced Care Planning, Provided resource for acquiring and filling out documents  Merchant navy officer to Joi T, [place] SW for process to complete POA while inpatient - via hospital chaplin]              SDOH assessments and interventions completed:  No     Care Coordination Interventions:  Yes, provided   Follow up plan: Follow up call scheduled for 01/22/23    Encounter Outcome:  Pt. Visit Completed   Laneice Meneely L. Noelle Penner, RN, BSN, CCM [facility] Care Management Community Coordinator Office number [phone]

## 2023-01-08 NOTE — TOC Progression Note (Addendum)
Transition of Care [facility]) - Progression Note    Patient Details  Name: Lisa [name] MRN: [id number] Date of Birth: Jun 08, 1935  Transition [facility] Care [facility]) CM/SW Contact  Leander Rams, LCSW Phone Number: 01/08/2023, 11:35 AM  Clinical Narrative:    CSW contacted [facility] in regards to pt admitting to facility at dc. There was no answer and a VM was left with Debbie from [facility].   Insurance Berkley Harvey is currently pending. TOC will continue to follow.   12:00PM Debbie with [facility] returned CSW call regarding pt admission to facility. Eunice Blase reports they currently do not have any female beds available but should have a room available by Wednesday and will be able to admit pt then.   Expected Discharge Plan: Skilled Nursing Facility Barriers to Discharge: Continued Medical Work up  Expected Discharge Plan and Services       Living arrangements for the past 2 months: Single Family Home                             HH Agency: [facility]         Social Determinants of Health (SDOH) Interventions SDOH Screenings   Food Insecurity: Patient Declined (12/19/2022)  Housing: Low Risk  (12/19/2022)  Transportation Needs: No Transportation Needs (12/19/2022)  Utilities: Not At Risk (12/19/2022)  Alcohol Screen: Low Risk  (10/24/2022)  Depression (PHQ2-9): Low Risk  (12/12/2022)  Financial Resource Strain: Low Risk  (10/24/2022)  Physical Activity: Inactive (10/24/2022)  Social Connections: Moderately Isolated (12/01/2022)  Stress: No Stress Concern Present (12/01/2022)  Tobacco Use: Medium Risk (01/03/2023)    Readmission Risk Interventions    12/17/2022    3:16 PM 11/16/2022    1:13 PM 09/06/2021    9:23 AM  Readmission Risk Prevention Plan  Transportation Screening  Complete Complete  PCP or Specialist Appt within 3-5 Days   Complete  HRI or Home Care Consult  Complete Complete  Social Work Consult for Recovery Care Planning/Counseling  Complete Complete   Palliative Care Screening  Complete Not Applicable  Medication Review Oceanographer) Complete Complete Complete  PCP or Specialist appointment within 3-5 days of discharge Complete     Oletta Lamas, MSW, LCSWA, LCASA Transitions of Care  Clinical Social Worker I

## 2023-01-08 NOTE — Plan of Care (Signed)

## 2023-01-08 NOTE — Progress Notes (Signed)
Physical Therapy Treatment Patient Details Name: Starley Jezewski MRN: [id number] DOB: 07-16-1934 Today's Date: 01/08/2023   History of Present Illness 88yo female wh presented to [facility] ED on 12/31/22 with SOB and chest tightness. Found to be in significant HTN and placed on BIPAP. EKG also with diffuse ST changes. Admitted to ICU due to hypertensive emergency. PMH anemia, carotid stenosis, CKD, COPD, hepatitis C, hx radiation therapy, HTN, MGUS    PT Comments  Pt reports being fatigued today, but is agreeable to PT session focused on functional mobility. Pt tolerated short room distance gait and repeated sit to stands for LE strengthening, does require seated rest breaks as needed and demonstrates dyspnea on exertion but SPO2 98% on RA. Pt with significant incoordination during gait, pt mostly able to correct herself but does require occasional steadying assist.  Pt would continue to benefit from PT post-acutely to improve strength, balance, coordination, and activity tolerance.     If plan is discharge home, recommend the following: A little help with walking and/or transfers;Assistance with cooking/housework;Assist for transportation;A little help with bathing/dressing/bathroom;Help with stairs or ramp for entrance   Can travel by private vehicle        Equipment Recommendations  Wheelchair (measurements PT);Wheelchair cushion (measurements PT)    Recommendations for Other Services       Precautions / Restrictions Precautions Precautions: Fall Precaution Comments: watch O2 - RA this date Restrictions Weight Bearing Restrictions: No     Mobility  Bed Mobility Overal bed mobility: Needs Assistance             General bed mobility comments: up in chair    Transfers Overall transfer level: Needs assistance Equipment used: Rolling walker (2 wheels) Transfers: Sit to/from Stand Sit to Stand: Min assist, Min guard           General transfer comment: initially light rise  assist needed, close guard and cues for form with repeated sit<>stands    Ambulation/Gait Ambulation/Gait assistance: Min assist Gait Distance (Feet): 25 Feet Assistive device: Rolling walker (2 wheels) Gait Pattern/deviations: Step-through pattern, Ataxic, Drifts right/left, Trunk flexed, Narrow base of support, Decreased stride length Gait velocity: decr     General Gait Details: assist to steady, cues for upright posture, placement in RW   Stairs             Wheelchair Mobility     Tilt Bed    Modified Rankin (Stroke Patients Only)       Balance Overall balance assessment: Needs assistance Sitting-balance support: No upper extremity supported, Feet supported Sitting balance-Leahy Scale: Good     Standing balance support: Bilateral upper extremity supported, During functional activity, Reliant on assistive device for balance Standing balance-Leahy Scale: Poor Standing balance comment: reliant on RW                            Cognition Arousal/Alertness: Awake/alert Behavior During Therapy: WFL for tasks assessed/performed Overall Cognitive Status: Within Functional Limits for tasks assessed                                          Exercises Other Exercises Other Exercises: sit<>stands x5 from recliner    General Comments General comments (skin integrity, edema, etc.): vss on ra, 98% post-gait      Pertinent Vitals/Pain Pain Assessment Pain Assessment: Faces Faces Pain  Scale: No hurt Pain Intervention(s): Monitored during session    Home Living                          Prior Function            PT Goals (current goals can now be found in the care plan section) Acute Rehab PT Goals Patient Stated Goal: get better Time For Goal Achievement: 01/16/23 Potential to Achieve Goals: Good Progress towards PT goals: Progressing toward goals    Frequency    Min 1X/week      PT Plan Current plan remains  appropriate    Co-evaluation              AM-PAC PT "6 Clicks" Mobility   Outcome Measure  Help needed turning from your back to your side while in a flat bed without using bedrails?: A Little Help needed moving from lying on your back to sitting on the side of a flat bed without using bedrails?: A Little Help needed moving to and from a bed to a chair (including a wheelchair)?: A Little Help needed standing up from a chair using your arms (e.g., wheelchair or bedside chair)?: A Little Help needed to walk in hospital room?: A Lot Help needed climbing 3-5 steps with a railing? : Total 6 Click Score: 15    End of Session Equipment Utilized During Treatment: Gait belt Activity Tolerance: Patient tolerated treatment well Patient left: in chair;with call bell/phone within reach;with chair alarm set;with nursing/sitter in room Nurse Communication: Mobility status PT Visit Diagnosis: Unsteadiness on feet (R26.81);Muscle weakness (generalized) (M62.81);Other abnormalities of gait and mobility (R26.89);Ataxic gait (R26.0);Difficulty in walking, not elsewhere classified (R26.2)     Time: 8938-1017 PT Time Calculation (min) (ACUTE ONLY): 18 min  Charges:    $Therapeutic Activity: 8-22 mins PT General Charges $$ ACUTE PT VISIT: 1 Visit                     Marye Round, PT DPT Acute Rehabilitation Services Secure Chat Preferred  Office [phone]    Carmie Lanpher E Christain [place] 01/08/2023, 2:12 PM

## 2023-01-08 NOTE — Patient Outreach (Signed)
Care Coordination   Follow Up Visit Note   01/08/2023  Name: Gerda Arakelyan MRN: [id number] DOB: [birth year]  [name] Everleaner Lina is a 88 y.o. year old female who sees Durwin Nora, Lucina Mellow, MD for primary care. I spoke with patient's niece, [name] by phone today.  What matters to the patients health and wellness today?  Find Help in My Community.   Goals Addressed             This Visit's Progress    Find Help in My Community.   On track    Care Coordination Interventions:  Interventions Today    Flowsheet Row Most Recent Value  Chronic Disease   Chronic disease during today's visit Chronic Obstructive Pulmonary Disease (COPD), Chronic Kidney Disease/End Stage Renal Disease (ESRD), Hypertension (HTN), Other  [Tobacco Abuse]  General Interventions   General Interventions Discussed/Reviewed --  [Encouraged]  Labs Hgb A1c annually  [Encouraged]  Vaccines COVID-19, Flu, Pneumonia, RSV, Shingles, Tetanus/Pertussis/Diphtheria  [Encouraged]  Doctor Visits Discussed/Reviewed Doctor Visits Discussed, Doctor Visits Reviewed, Annual Wellness Visits, PCP, Specialist  [Encouraged]  Health Screening Bone Density, Colonoscopy, Mammogram  [Encouraged]  PCP/Specialist Visits Compliance with follow-up visit  [Encouraged]  Communication with PCP/Specialists  [Encouraged]  Level of Care Adult Daycare, Personal Care Services, Applications, Assisted Living, Skilled Nursing Facility  [Encouraged]  Applications Medicaid, Personal Care Services, FL-2  [Encouraged]  Exercise Interventions   Exercise Discussed/Reviewed Exercise Discussed, Assistive device use and maintanence, Exercise Reviewed, Physical Activity, Weight Managment  [Encouraged]  Physical Activity Discussed/Reviewed Physical Activity Discussed, Physical Activity Reviewed, Home Exercise Program (HEP), Types of exercise  [Encouraged]  Weight Management Weight maintenance  [Encouraged]  Education Interventions   Education Provided  Provided Therapist, sports, Provided Web-based Education, Provided Education  Provided Verbal Education On Nutrition, Mental Health/Coping with Illness, When to see the doctor, Foot Care, Eye Care, Applications, Labs, Exercise, Medication, Blood Sugar Monitoring, Programmer, applications, Nurse, mental health, Personal Care Services, FL-2  [Encouraged]  Mental Health Interventions   Mental Health Discussed/Reviewed Mental Health Discussed, Anxiety, Depression, Grief and Loss, Mental Health Reviewed, Substance Abuse, Coping Strategies, Crisis, Other, Suicide  [Domestic Violence]  Nutrition Interventions   Nutrition Discussed/Reviewed Nutrition Discussed, Adding fruits and vegetables, Increaing proteins, Decreasing fats, Decreasing salt, Supplmental nutrition, Decreasing sugar intake, Portion sizes, Fluid intake, Nutrition Reviewed, Carbohydrate meal planning  [Encouraged]  Pharmacy Interventions   Pharmacy Dicussed/Reviewed Pharmacy Topics Discussed, Medications and their functions, Medication Adherence, Pharmacy Topics Reviewed, Affording Medications  [Encouraged]  Safety Interventions   Safety Discussed/Reviewed Safety Discussed, Safety Reviewed, Home Safety  [Encouraged]  Home Safety Assistive Devices, Need for home safety assessment, Refer for community resources  [Encouraged]  Advanced Directive Interventions   Advanced Directives Discussed/Reviewed Advanced Directives Discussed  [Advanced Directives Completed]      Active Listening & Reflection Utilized.  Verbalization of Feelings Encouraged.  Emotional Support Provided. Feelings of Caregiver Burnout & Fatigue Validated. Symptoms of Caregiver Stress & Anxiety Acknowledged. Problem Solving Interventions Employed. Task-Centered Solutions Indicated.   Solution-Focused Strategies Activated. Acceptance & Commitment Therapy Performed. Cognitive Behavioral Therapy Initiated. Client-Centered Therapy  Implemented. CSW Collaboration with Edd Arbour, Nurse Case Manager with [facility] & Niece, [name], Via [employer], to Address Concerns Pertaining to Skilled Nursing Facility Placement, Completion of Advanced Directives, & Referral for Palliative Care Services. CSW Collaboration with Niece, [name] to Confirm Patient's Continued Stay at [facility] & Agreement to Skilled Nursing Facility  Placement for [employer] CSW Collaboration with Niece, [name] to Confirm Interest in Skilled Nursing Facility Placement for Patient at [facility], to [employer]. CSW Collaboration with Dr. Christel Mormon, Primary Care Provider with [facility], to Express Patient & Niece, Bronson Ing Moore's Interest in His Continued Involvement with Her Care, Even While Residing at [facility]. CSW Collaboration with Niece, [name] to [employer] Completion of Advance Directives (Living Will & Healthcare Power of Attorney Documents), While Patient Remains Hospitalized at [facility]. CSW Collaboration with Niece, [name] to Encourage Involvement with [facility] Velna Hatchet, to Assist with Completion of [employer].  CSW Collaboration with Niece, [name] to Confirm Agreement in Referral for Palliative Care Services, through [facility] [phone]). CSW Collaboration with Niece, [name] to [employer] with CSW [phone]), if She Has Questions, Needs Assistance, or If Additional Social Work Needs Are Identified Between Now & Our Next Scheduled Follow-Up Outreach Call.      SDOH assessments and interventions completed:  Yes.  Care Coordination Interventions:  Yes, provided.   Follow up plan: Follow up call scheduled for 02/05/2023 at 1:30 pm.  Encounter  Outcome:  Pt. Visit Completed.   Danford Bad, BSW, MSW, LCSW  Licensed Restaurant manager, [employer] [facility]  [employer] [place], [place] [address] Physical Address-[address], [place], [place] [address] Toll Free Main # [phone] Fax # [phone] Cell # [phone] [email or web address]

## 2023-01-08 NOTE — Progress Notes (Signed)
PROGRESS NOTE    Lisa [name]  ZOX:[id number] DOB: 01-27-1935 DOA: 12/31/2022 PCP: [name], MD    Brief Narrative:   88 year old female  with past medical history significant for HTN, CAD, COPD, lung CA status post radiation, CKD stage 3b, MGUS presented to the hospital with shortness of breath and chest tightness.  Patient admission x 2 for non-ST elevation MI when she had refused cardiac catheterization , managed medically.  On this admission, she was initially put on BiPAP for increased work of breathing.   EKG was significant for diffuse ST changes and cardiology was consulted. Nitroglycerin infusion was started for chest pain and blood pressure control. PCCM was asked to admit to ICU for hypertensive emergency.  Patient was subsequently weaned off nitroglycerin drip and was transferred out of the ICU. At this time patient is stable for disposition pending skilled nursing facility placement.  Assessment and Plan:   Hypertensive Emergency:  Resolved.  Blood pressure stable on on amlodipine and Coreg.   Acute hypoxic respiratory failure in the setting of acute pulmonary edema Acute on chronic HFpEF Improved at this time.  Continue strict intake and output charting.  On room air.  Continue p.o. Lasix.  Last creatinine of 2.0.  NSTEMI;  EKG showed ST and T wave changes with elevated troponin.  History of refusing cath in the past and refused at this time as well.  Continue  aspirin, Plavix, Coreg and Crestor, Ranexa. Cardiology followed the patient during hospitalization and will need outpatient follow-up.  No further chest pain.  Has remained stable for several days   Anemia of chronic kidney disease Patient received 1 unit of packed RBC for hemoglobin of 6.9 on 01/02/2023.  Latest hemoglobin at 8.1.  No signs of bleeding.  Has mild thrombocytopenia , improving. Latest platelet of 102  Acute kidney injury on stage 4 CKD:  Latest creatinine at 2.0 , from 2.0.  Stable at this  time.  Continue to monitor.   Acute COPD exacerbation. Continue DuoNebs, incentive spirometry, on room air.  Compensated    Thrombocytopenia:  Improving. No bleeding. .    DVT prophylaxis: Place and maintain sequential compression device Start: 01/03/23 1147SCD.   Code Status:     Code Status: DNR  Disposition:  Skilled nursing facility when bed available. Medically stable for disposition.  Status is: Inpatient  Remains inpatient appropriate because: Pending skilled nursing facility placement,   Family Communication:  Spoke with the patient's niece and HPOA on the phone and updated her about the clinical condition of the patient on 01/08/23   Consultants:  Cardiology PCCM  Procedures:  Transfusion of 1 unit of packed RBC  Antimicrobials:  None   Subjective:  Today, patient denies any shortness of breath, chest pain, dyspnea.  Awaiting for skilled nursing facility placement.  Objective: Vitals:   01/07/23 1948 01/08/23 0114 01/08/23 0519 01/08/23 0801  BP: (!) 161/40 137/63 (!) 128/57 137/62  Pulse: 62 (!) 55 60 60  Resp: 20 20 20 19   Temp: 98.4 F (36.9 C) 97.9 F (36.6 C) 97.8 F (36.6 C) 98.2 F (36.8 C)  TempSrc: Oral Oral Oral Oral  SpO2: 96% 95% 94% 94%  Weight:   46 kg     Intake/Output Summary (Last 24 hours) at 01/08/2023 1037 Last data filed at 01/08/2023 0803 Gross per 24 hour  Intake 460 ml  Output 150 ml  Net 310 ml   Filed Weights   01/06/23 0502 01/07/23 0409 01/08/23 8119  Weight: 48 kg 46.4 kg 46 kg    Physical Examination: Body mass index is 15.88 kg/m.   General: Thinly built, not in obvious distress, on nasal cannula oxygen, elderly female. HENT:   Pallor noted. Oral mucosa is moist.  Chest: .  Diminished breath sounds bilaterally.  Clear breath sounds.   CVS: S1 &S2 heard. No murmur.  Regular rate and rhythm. Abdomen: Soft, nontender, nondistended.  Bowel sounds are heard.   Extremities: No cyanosis, clubbing or edema.    Psych: Alert, awake and oriented, normal mood CNS:  No cranial nerve deficits.  Nonfocal. Skin: Warm and dry.  No rashes noted.  Data Reviewed:   CBC: Recent Labs  Lab 01/02/23 0128 01/02/23 1726 01/02/23 2348 01/04/23 0056 01/05/23 0019 01/07/23 0108  WBC 8.0  --  5.6 4.4 3.8* 4.1  HGB 6.9* 8.9* 8.1* 8.0* 8.4* 8.1*  HCT 21.4* 27.7* 25.2* 24.6* 25.8* 24.8*  MCV 94.3  --  91.0 88.5 89.3 88.6  PLT 83*  --  86* 90* 102* 105*    Basic Metabolic Panel: Recent Labs  Lab 01/02/23 0128 01/02/23 2348 01/04/23 0056 01/07/23 0108  NA 136 136 135 132*  K 4.7 4.5 4.3 4.5  CL 104 107 104 102  CO2 25 24 24 25   GLUCOSE 111* 80 80 90  BUN 77* 70* 61* 47*  CREATININE 2.04* 1.81* 1.86* 2.03*  CALCIUM 8.4* 8.5* 8.4* 8.2*  MG 2.0  --  2.1  --   PHOS 4.3  --   --   --     Liver Function Tests: No results for input(s): "AST", "ALT", "ALKPHOS", "BILITOT", "PROT", "ALBUMIN" in the last 168 hours.   Radiology Studies: No results found.    LOS: 8 days    Joycelyn Das, MD [facility] Available via Epic secure chat 7am-7pm After these hours, please refer to coverage provider listed on [email or web address] 01/08/2023, 10:37 AM

## 2023-01-08 NOTE — Patient Instructions (Signed)
Visit Information  Thank you for taking time to visit with me today. Please don't hesitate to contact me if I can be of assistance to you.   Following are the goals we discussed today:   Goals Addressed             This Visit's Progress    [facility] care coordination services (admissions, hypertensive urgency, Respiratory failure))       Interventions Today    Flowsheet Row Most Recent Value  Chronic Disease   Chronic disease during today's visit Other  General Interventions   General Interventions Discussed/Reviewed General Interventions Reviewed, [employer], Doctor Visits, Communication with, Level of Care  Doctor Visits Discussed/Reviewed Doctor Visits Reviewed, PCP, Specialist  PCP/Specialist Visits Compliance with follow-up visit  Communication with Social Work  Level of Care Skilled Nursing Facility  Education Interventions   Education Provided Provided Education  [POA services via hospital chaplin while inpatient, services at [employer] facility, pcp role after in facility, facility MD services, palliative care services, [facility] services]  Provided Verbal Education On [employer], Other  Mental Health Interventions   Mental Health Discussed/Reviewed Mental Health Reviewed, Coping Strategies, Other  [confirmed patient preference to go to nursing facility, palliative care services and goals of care]  Advanced Directive Interventions   Advanced Directives Discussed/Reviewed Advanced Directives Reviewed, Advanced Care Planning, Provided resource for acquiring and filling out documents  [outreach to Gaylene Brooks SW for process to complete POA while inpatient - via hospital chaplin]              Our next appointment is by telephone on 01/22/23 at 1:30 pm  Please call the care guide team at [phone] if you need to cancel or reschedule your appointment.   If you are experiencing a Mental Health or Behavioral Health Crisis or need someone to talk to,  please call the Suicide and Crisis Lifeline: 988 call the [place] National Suicide Prevention Lifeline: 920-083-7383 or TTY: (719)865-0066 TTY (380) 184-3329) to talk to a trained counselor call 1-800-273-TALK (toll free, 24 hour hotline) call the [facility]: (563)757-8712 call 911   No access to my chart nor a request to a copy of the AVS  The patient has been provided with contact information for the care management team and has been advised to call with any health related questions or concerns.   Haniel Fix L. Noelle Penner, RN, BSN, CCM [facility] Care Management Community Coordinator Office number [phone]

## 2023-01-09 DIAGNOSIS — J9601 Acute respiratory failure with hypoxia: Secondary | ICD-10-CM | POA: Diagnosis not present

## 2023-01-09 DIAGNOSIS — J438 Other emphysema: Secondary | ICD-10-CM | POA: Diagnosis not present

## 2023-01-09 DIAGNOSIS — J441 Chronic obstructive pulmonary disease with (acute) exacerbation: Secondary | ICD-10-CM | POA: Diagnosis not present

## 2023-01-09 DIAGNOSIS — I161 Hypertensive emergency: Secondary | ICD-10-CM | POA: Diagnosis not present

## 2023-01-09 NOTE — Progress Notes (Signed)
Mobility Specialist Progress Note:    01/09/23 1614  Mobility  Activity Transferred from chair to bed  Level of Assistance Minimal assist, patient does 75% or more  Assistive Device Front wheel walker  Activity Response Tolerated well  Mobility Referral Yes  $Mobility charge 1 Mobility  Mobility Specialist Start Time (ACUTE ONLY) 1600  Mobility Specialist Stop Time (ACUTE ONLY) 1616  Mobility Specialist Time Calculation (min) (ACUTE ONLY) 16 min   Pt received in chair requesting to return to bed. Pt needed MinA to stand and transfer d/t unsteady gait. Pt was able to take a couple of steps towards the bed with some assistance. Pt was able to perform bed mobility independently. Call bell and personal belonging in reach. Bed alarm is on.   Thompson Grayer Mobility Specialist  Please contact vis Secure Chat or  Rehab Office [phone]

## 2023-01-09 NOTE — Progress Notes (Signed)
PROGRESS NOTE    Lisa [name]  ZOX:[id number] DOB: 04/14/1935 DOA: 12/31/2022 PCP: [name], MD    Brief Narrative:   88 year old female  with past medical history significant for HTN, CAD, COPD, lung CA status post radiation, CKD stage 3b, MGUS presented to the hospital with shortness of breath and chest tightness.  Patient admission x 2 for non-ST elevation MI when she had refused cardiac catheterization , managed medically.  On this admission, she was initially put on BiPAP for increased work of breathing.   EKG was significant for diffuse ST changes and cardiology was consulted. Nitroglycerin infusion was started for chest pain and blood pressure control. PCCM was asked to admit to ICU for hypertensive emergency.  Patient was subsequently weaned off nitroglycerin drip and was transferred out of the ICU.  At this time, patient is stable for disposition pending skilled nursing facility placement.  Assessment and Plan:   Hypertensive Emergency:  Resolved.  Blood pressure stable on on amlodipine and Coreg.   Acute hypoxic respiratory failure in the setting of acute pulmonary edema Acute on chronic HFpEF Improved at this time.  Continue strict intake and output charting.  On room air.  Continue p.o. Lasix.  Last creatinine of 2.0.  NSTEMI;  EKG showed ST and T wave changes with elevated troponin.  History of refusing cath in the past and refused at this time as well.  Continue  aspirin, Plavix, Coreg and Crestor, Ranexa. Cardiology followed the patient during hospitalization and will need outpatient follow-up.  Denies further chest pain.     Anemia of chronic kidney disease Patient received 1 unit of packed RBC for hemoglobin of 6.9 on 01/02/2023.  Latest hemoglobin at 8.1.  No signs of bleeding.  Has mild thrombocytopenia , improving. Latest platelet of 105, check cbc bmp in am.  Acute kidney injury on stage 4 CKD:  Latest creatinine at 2.0 , from 2.0.  Stable at this time.   Continue to monitor. Check bmp in am.   Acute COPD exacerbation. Continue DuoNebs, incentive spirometry, on room air.  Compensated    Thrombocytopenia:  Improving. No bleeding. .    DVT prophylaxis: Place and maintain sequential compression device Start: 01/03/23 1147SCD.   Code Status:     Code Status: DNR  Disposition:  Skilled nursing facility when bed available. Medically stable for disposition.  Status is: Inpatient  Remains inpatient appropriate because: Pending skilled nursing facility placement,   Family Communication:  Spoke with the patient's niece and HPOA on the phone and updated her about the clinical condition of the patient on 01/08/23   Consultants:  Cardiology PCCM  Procedures:  Transfusion of 1 unit of packed RBC  Antimicrobials:  None   Subjective:  Today, patient was seen and examined at bedside.  Denies interval complaints.  Denies any shortness of breath, fever, chills, chest pain or dyspnea.  Awaiting for skilled nursing facility placement.   Objective: Vitals:   01/09/23 0103 01/09/23 0438 01/09/23 0814 01/09/23 1216  BP: (!) 144/50 (!) 147/51 (!) 133/40 (!) 107/49  Pulse: (!) 58 (!) 55 (!) 58 (!) 57  Resp: 20 20 18 20   Temp: 98.1 F (36.7 C) 98.1 F (36.7 C) 98 F (36.7 C) (!) 97.5 F (36.4 C)  TempSrc: Oral Oral Oral Oral  SpO2: 97% 93% 94% 97%  Weight:  46 kg      Intake/Output Summary (Last 24 hours) at 01/09/2023 1244 Last data filed at 01/09/2023 0700 Gross per 24  hour  Intake 637 ml  Output 600 ml  Net 37 ml   Filed Weights   01/07/23 0409 01/08/23 0519 01/09/23 0438  Weight: 46.4 kg 46 kg 46 kg    Physical Examination: Body mass index is 15.88 kg/m.   General: Thinly built, not in obvious distress, elderly female HENT:   No scleral pallor or icterus noted. Oral mucosa is moist.  Chest:  Clear breath sounds.  No crackles or wheezes.  CVS: S1 &S2 heard. No murmur.  Regular rate and rhythm. Abdomen: Soft, nontender,  nondistended.  Bowel sounds are heard.   Extremities: No cyanosis, clubbing or edema.  Peripheral pulses are palpable. Psych: Alert, awake and oriented, normal mood CNS:  No cranial nerve deficits.  Moves all extremities. Skin: Warm and dry.  No rashes noted.   Data Reviewed:   CBC: Recent Labs  Lab 01/02/23 1726 01/02/23 2348 01/04/23 0056 01/05/23 0019 01/07/23 0108  WBC  --  5.6 4.4 3.8* 4.1  HGB 8.9* 8.1* 8.0* 8.4* 8.1*  HCT 27.7* 25.2* 24.6* 25.8* 24.8*  MCV  --  91.0 88.5 89.3 88.6  PLT  --  86* 90* 102* 105*    Basic Metabolic Panel: Recent Labs  Lab 01/02/23 2348 01/04/23 0056 01/07/23 0108  NA 136 135 132*  K 4.5 4.3 4.5  CL 107 104 102  CO2 24 24 25   GLUCOSE 80 80 90  BUN 70* 61* 47*  CREATININE 1.81* 1.86* 2.03*  CALCIUM 8.5* 8.4* 8.2*  MG  --  2.1  --     Liver Function Tests: No results for input(s): "AST", "ALT", "ALKPHOS", "BILITOT", "PROT", "ALBUMIN" in the last 168 hours.   Radiology Studies: No results found.    LOS: 9 days    Joycelyn Das, MD [facility] Available via Epic secure chat 7am-7pm After these hours, please refer to coverage provider listed on [email or web address] 01/09/2023, 12:44 PM

## 2023-01-09 NOTE — Consult Note (Signed)
   [facility] CM Inpatient Consult   01/09/2023  Lisa [name] [birth year] [id number]  Follow up:  Met with patient and niece Lisa [name] at the bedside.  Awaiting for a SNF rehab bed with [facility]. Niece plans to remain active with [facility], Lisa Batten, RN  and Lisa Celeste LCSW for post facility community care coordination needs.  If patient goes to a [facility] affiliated facility will also alert the [facility] RN of active with CC team.  For questions,   Charlesetta Shanks, RN BSN CCM [facility]  [phone] business mobile phone Toll free office [phone]  *Concierge Line  [phone] Fax number: [phone] [email or web address]

## 2023-01-09 NOTE — Progress Notes (Signed)
  Mobility Specialist Progress Note:    01/09/23 1024  Mobility  Activity Transferred from bed to chair  Level of Assistance Minimal assist, patient does 75% or more  Assistive Device Front wheel walker  Activity Response Tolerated well  Mobility Referral Yes  $Mobility charge 1 Mobility  Mobility Specialist Start Time (ACUTE ONLY) [id number]  Mobility Specialist Stop Time (ACUTE ONLY) 0935  Mobility Specialist Time Calculation (min) (ACUTE ONLY) 10 min   Pt received in bed agreeable to transfer to chair. Needed MinA for STS and during transfer d/t unsteadiness. No c/o throughout session. Pt situated in chair w/ call bell and personal belongings in reach. Chair alarm on.   Thompson Grayer Mobility Specialist  Please contact vis Secure Chat or  Rehab Office [phone]

## 2023-01-10 DIAGNOSIS — Z8249 Family history of ischemic heart disease and other diseases of the circulatory system: Secondary | ICD-10-CM | POA: Diagnosis not present

## 2023-01-10 DIAGNOSIS — J449 Chronic obstructive pulmonary disease, unspecified: Secondary | ICD-10-CM | POA: Diagnosis not present

## 2023-01-10 DIAGNOSIS — R278 Other lack of coordination: Secondary | ICD-10-CM | POA: Diagnosis not present

## 2023-01-10 DIAGNOSIS — I2510 Atherosclerotic heart disease of native coronary artery without angina pectoris: Secondary | ICD-10-CM | POA: Diagnosis not present

## 2023-01-10 DIAGNOSIS — I161 Hypertensive emergency: Secondary | ICD-10-CM | POA: Diagnosis not present

## 2023-01-10 DIAGNOSIS — R7989 Other specified abnormal findings of blood chemistry: Secondary | ICD-10-CM | POA: Diagnosis not present

## 2023-01-10 DIAGNOSIS — R531 Weakness: Secondary | ICD-10-CM | POA: Diagnosis not present

## 2023-01-10 DIAGNOSIS — R799 Abnormal finding of blood chemistry, unspecified: Secondary | ICD-10-CM | POA: Diagnosis not present

## 2023-01-10 DIAGNOSIS — Z87891 Personal history of nicotine dependence: Secondary | ICD-10-CM | POA: Diagnosis not present

## 2023-01-10 DIAGNOSIS — C3490 Malignant neoplasm of unspecified part of unspecified bronchus or lung: Secondary | ICD-10-CM | POA: Diagnosis not present

## 2023-01-10 DIAGNOSIS — R636 Underweight: Secondary | ICD-10-CM | POA: Diagnosis present

## 2023-01-10 DIAGNOSIS — R918 Other nonspecific abnormal finding of lung field: Secondary | ICD-10-CM | POA: Diagnosis not present

## 2023-01-10 DIAGNOSIS — I10 Essential (primary) hypertension: Secondary | ICD-10-CM | POA: Diagnosis not present

## 2023-01-10 DIAGNOSIS — M6281 Muscle weakness (generalized): Secondary | ICD-10-CM | POA: Diagnosis not present

## 2023-01-10 DIAGNOSIS — I130 Hypertensive heart and chronic kidney disease with heart failure and stage 1 through stage 4 chronic kidney disease, or unspecified chronic kidney disease: Secondary | ICD-10-CM | POA: Diagnosis present

## 2023-01-10 DIAGNOSIS — R0603 Acute respiratory distress: Secondary | ICD-10-CM | POA: Diagnosis not present

## 2023-01-10 DIAGNOSIS — Z7982 Long term (current) use of aspirin: Secondary | ICD-10-CM | POA: Diagnosis not present

## 2023-01-10 DIAGNOSIS — Z7902 Long term (current) use of antithrombotics/antiplatelets: Secondary | ICD-10-CM | POA: Diagnosis not present

## 2023-01-10 DIAGNOSIS — I2489 Other forms of acute ischemic heart disease: Secondary | ICD-10-CM | POA: Diagnosis present

## 2023-01-10 DIAGNOSIS — N184 Chronic kidney disease, stage 4 (severe): Secondary | ICD-10-CM | POA: Diagnosis present

## 2023-01-10 DIAGNOSIS — Z923 Personal history of irradiation: Secondary | ICD-10-CM | POA: Diagnosis not present

## 2023-01-10 DIAGNOSIS — Z741 Need for assistance with personal care: Secondary | ICD-10-CM | POA: Diagnosis not present

## 2023-01-10 DIAGNOSIS — I5032 Chronic diastolic (congestive) heart failure: Secondary | ICD-10-CM | POA: Diagnosis present

## 2023-01-10 DIAGNOSIS — R0602 Shortness of breath: Secondary | ICD-10-CM | POA: Diagnosis not present

## 2023-01-10 DIAGNOSIS — J9602 Acute respiratory failure with hypercapnia: Secondary | ICD-10-CM | POA: Diagnosis present

## 2023-01-10 DIAGNOSIS — J441 Chronic obstructive pulmonary disease with (acute) exacerbation: Secondary | ICD-10-CM | POA: Diagnosis not present

## 2023-01-10 DIAGNOSIS — Z743 Need for continuous supervision: Secondary | ICD-10-CM | POA: Diagnosis not present

## 2023-01-10 DIAGNOSIS — I5033 Acute on chronic diastolic (congestive) heart failure: Secondary | ICD-10-CM | POA: Diagnosis not present

## 2023-01-10 DIAGNOSIS — E8729 Other acidosis: Secondary | ICD-10-CM | POA: Diagnosis present

## 2023-01-10 DIAGNOSIS — J988 Other specified respiratory disorders: Secondary | ICD-10-CM | POA: Diagnosis not present

## 2023-01-10 DIAGNOSIS — R0902 Hypoxemia: Secondary | ICD-10-CM | POA: Diagnosis not present

## 2023-01-10 DIAGNOSIS — Z1388 Encounter for screening for disorder due to exposure to contaminants: Secondary | ICD-10-CM | POA: Diagnosis not present

## 2023-01-10 DIAGNOSIS — Z681 Body mass index (BMI) 19 or less, adult: Secondary | ICD-10-CM | POA: Diagnosis not present

## 2023-01-10 DIAGNOSIS — Z515 Encounter for palliative care: Secondary | ICD-10-CM | POA: Diagnosis not present

## 2023-01-10 DIAGNOSIS — Z90710 Acquired absence of both cervix and uterus: Secondary | ICD-10-CM | POA: Diagnosis not present

## 2023-01-10 DIAGNOSIS — J9690 Respiratory failure, unspecified, unspecified whether with hypoxia or hypercapnia: Secondary | ICD-10-CM | POA: Diagnosis not present

## 2023-01-10 DIAGNOSIS — I252 Old myocardial infarction: Secondary | ICD-10-CM | POA: Diagnosis not present

## 2023-01-10 DIAGNOSIS — D696 Thrombocytopenia, unspecified: Secondary | ICD-10-CM | POA: Diagnosis present

## 2023-01-10 DIAGNOSIS — Z79899 Other long term (current) drug therapy: Secondary | ICD-10-CM | POA: Diagnosis not present

## 2023-01-10 DIAGNOSIS — R778 Other specified abnormalities of plasma proteins: Secondary | ICD-10-CM | POA: Diagnosis not present

## 2023-01-10 DIAGNOSIS — T502X5A Adverse effect of carbonic-anhydrase inhibitors, benzothiadiazides and other diuretics, initial encounter: Secondary | ICD-10-CM | POA: Diagnosis present

## 2023-01-10 DIAGNOSIS — J9601 Acute respiratory failure with hypoxia: Secondary | ICD-10-CM | POA: Diagnosis not present

## 2023-01-10 DIAGNOSIS — Z1152 Encounter for screening for COVID-19: Secondary | ICD-10-CM | POA: Diagnosis not present

## 2023-01-10 DIAGNOSIS — Z7401 Bed confinement status: Secondary | ICD-10-CM | POA: Diagnosis not present

## 2023-01-10 DIAGNOSIS — Z66 Do not resuscitate: Secondary | ICD-10-CM | POA: Diagnosis present

## 2023-01-10 DIAGNOSIS — I808 Phlebitis and thrombophlebitis of other sites: Secondary | ICD-10-CM | POA: Diagnosis not present

## 2023-01-10 DIAGNOSIS — J438 Other emphysema: Secondary | ICD-10-CM | POA: Diagnosis not present

## 2023-01-10 DIAGNOSIS — D631 Anemia in chronic kidney disease: Secondary | ICD-10-CM | POA: Diagnosis present

## 2023-01-10 MED ORDER — CARVEDILOL 12.5 MG PO TABS: 12.5000 mg | ORAL_TABLET | Freq: Two times a day (BID) | ORAL | Status: DC

## 2023-01-10 MED ORDER — AMLODIPINE BESYLATE 10 MG PO TABS: 10.0000 mg | ORAL_TABLET | Freq: Every day | ORAL | Status: DC

## 2023-01-10 MED ORDER — DOCUSATE SODIUM 100 MG PO CAPS: 100.0000 mg | ORAL_CAPSULE | Freq: Two times a day (BID) | ORAL | 0 refills | Status: DC | PRN

## 2023-01-10 NOTE — Progress Notes (Signed)
Occupational Therapy Treatment Patient Details Name: Lisa [name] MRN: [id number] DOB: 10/06/1934 Today's Date: 01/10/2023   History of present illness 88yo female wh presented to [facility] ED on 12/31/22 with SOB and chest tightness. Found to be in significant HTN and placed on BIPAP. EKG also with diffuse ST changes. Admitted to ICU due to hypertensive emergency. PMH anemia, carotid stenosis, CKD, COPD, hepatitis C, hx radiation therapy, HTN, MGUS   OT comments  Pt progressing towards goals, able to complete seated ADLs with minA (for fine motor aspects of task), min-mod A for transfers with RW. Pt able to ambulate to sink from chair with close chair follow. Pt states she is able to hold/drink from cup if she really concentrates, provided with weighted cup, able to bring to mouth for a few reps without spillage. Pt presenting with impairments listed below, will follow acutely. Patient will benefit from continued inpatient follow up therapy, <3 hours/day to maximize safety/ind with ADLs/functional mobility.    Recommendations for follow up therapy are one component of a multi-disciplinary discharge planning process, led by the attending physician.  Recommendations may be updated based on patient status, additional functional criteria and insurance authorization.    Assistance Recommended at Discharge Frequent or constant Supervision/Assistance  Patient can return home with the following  A little help with walking and/or transfers;A little help with bathing/dressing/bathroom;Assistance with cooking/housework;Assist for transportation;Help with stairs or ramp for entrance;Direct supervision/assist for medications management;Direct supervision/assist for financial management   Equipment Recommendations  Other (comment) (defer)    Recommendations for Other Services      Precautions / Restrictions Precautions Precautions: Fall Precaution Comments: watch O2 - RA this date Restrictions Weight  Bearing Restrictions: No       Mobility Bed Mobility               General bed mobility comments: up in chair    Transfers Overall transfer level: Needs assistance Equipment used: Rolling walker (2 wheels) Transfers: Sit to/from Stand Sit to Stand: Mod assist, Min assist           General transfer comment: able to ambulate to sink with close chair follow     Balance Overall balance assessment: Needs assistance Sitting-balance support: No upper extremity supported, Feet supported Sitting balance-Leahy Scale: Good     Standing balance support: Bilateral upper extremity supported, During functional activity, Reliant on assistive device for balance Standing balance-Leahy Scale: Poor Standing balance comment: reliant on RW                           ADL either performed or assessed with clinical judgement   ADL Overall ADL's : Needs assistance/impaired Eating/Feeding: Minimal assistance Eating/Feeding Details (indicate cue type and reason): openign small items Grooming: Oral care;Sitting;Minimal assistance Grooming Details (indicate cue type and reason): brushing teeth seated at sink, assist for opening toothpaste tube                 Toilet Transfer: Minimal assistance;Moderate assistance;Ambulation;Regular Toilet;Rolling walker (2 wheels) Toilet Transfer Details (indicate cue type and reason): simulated via functional mobility         Functional mobility during ADLs: Minimal assistance;Moderate assistance;Rolling walker (2 wheels)      Extremity/Trunk Assessment Upper Extremity Assessment RUE Deficits / Details: ataxic quality to movement RUE Coordination: decreased fine motor;decreased gross motor LUE Deficits / Details: ataxic quality to movement LUE Coordination: decreased fine motor;decreased gross motor   Lower Extremity Assessment Lower  Extremity Assessment: Defer to PT evaluation        Vision   Vision Assessment?: No apparent  visual deficits   Perception Perception Perception: Not tested   Praxis Praxis Praxis: Not tested    Cognition Arousal/Alertness: Awake/alert Behavior During Therapy: WFL for tasks assessed/performed Overall Cognitive Status: Within Functional Limits for tasks assessed                                          Exercises      Shoulder Instructions       General Comments VSS on RA    Pertinent Vitals/ Pain       Pain Assessment Pain Assessment: No/denies pain  Home Living                                          Prior Functioning/Environment              Frequency  Min 1X/week        Progress Toward Goals  OT Goals(current goals can now be found in the care plan section)  Progress towards OT goals: Progressing toward goals  Acute Rehab OT Goals OT Goal Formulation: With patient Time For Goal Achievement: 01/17/23 Potential to Achieve Goals: Good ADL Goals Pt Will Transfer to Toilet: with supervision;ambulating;bedside commode Pt Will Perform Toileting - Clothing Manipulation and hygiene: with supervision;sit to/from stand Additional ADL Goal #1: Pt will complete basice ADLs seated at sink with set up.  Plan Discharge plan remains appropriate    Co-evaluation                 AM-PAC OT "6 Clicks" Daily Activity     Outcome Measure   Help from another person eating meals?: A Little Help from another person taking care of personal grooming?: A Little Help from another person toileting, which includes using toliet, bedpan, or urinal?: A Lot Help from another person bathing (including washing, rinsing, drying)?: A Little Help from another person to put on and taking off regular upper body clothing?: A Little Help from another person to put on and taking off regular lower body clothing?: A Lot 6 Click Score: 16    End of Session Equipment Utilized During Treatment: Gait belt;Rolling walker (2 wheels)  OT Visit  Diagnosis: Unsteadiness on feet (R26.81);Other abnormalities of gait and mobility (R26.89)   Activity Tolerance Patient tolerated treatment well   Patient Left in chair;with call bell/phone within reach;with chair alarm set   Nurse Communication Mobility status        Time: 1610-9604 OT Time Calculation (min): 19 min  Charges: OT General Charges $OT Visit: 1 Visit OT Treatments $Self Care/Home Management : 8-22 mins  Carver Fila, OTD, OTR/L SecureChat Preferred Acute Rehab [phone]   Carver Fila Koonce 01/10/2023, 2:59 PM

## 2023-01-10 NOTE — Progress Notes (Signed)
RN attempted to called report  X 4 to [facility], but no one answer the phone.

## 2023-01-10 NOTE — Progress Notes (Signed)
Mobility Specialist Progress Note:    01/10/23 0944  Mobility  Activity Transferred from bed to chair  Level of Assistance Minimal assist, patient does 75% or more  Assistive Device Front wheel walker  Distance Ambulated (ft) 6 ft  Activity Response Tolerated well  Mobility Referral Yes  $Mobility charge 1 Mobility  Mobility Specialist Start Time (ACUTE ONLY) [id number]  Mobility Specialist Stop Time (ACUTE ONLY) [id number]  Mobility Specialist Time Calculation (min) (ACUTE ONLY) 10 min   Pt received in bed, agreeable to transfer to chair. Pt needed no physical assistance for bed mobility but MinA w/ STS. Pt was able to take a couple of steps towards the chair. Needed contact guard d/t unsteady gait. Pt situated in chair w/ call bell and personal belongings in reach. All needs met.  Thompson Grayer Mobility Specialist  Please contact vis Secure Chat or  Rehab Office [phone]

## 2023-01-10 NOTE — Care Management Important Message (Signed)
Important Message  Patient Details  Name: Lisa [name] MRN: [id number] Date of Birth: 1934-12-10   Medicare Important Message Given:  Yes     Renie Ora 01/10/2023, 12:15 PM

## 2023-01-10 NOTE — Progress Notes (Signed)
PROGRESS NOTE    Lisa [name]  NWG:[id number] DOB: September 24, 1934 DOA: 12/31/2022 PCP: [name], MD    Brief Narrative:   88 year old female  with past medical history significant for HTN, CAD, COPD, lung CA status post radiation, CKD stage 3b, MGUS presented to the hospital with shortness of breath and chest tightness.  Patient  with previous admission x 2 for non-ST elevation MI when she had refused cardiac catheterization , managed medically.  On this admission, she was initially put on BiPAP for increased work of breathing.   EKG was significant for diffuse ST changes and cardiology was consulted. Nitroglycerin infusion was started for chest pain and blood pressure control. PCCM was asked to admit to ICU for hypertensive emergency.  Patient was subsequently weaned off nitroglycerin drip and was transferred out of the ICU.  At this time, patient is stable for disposition pending skilled nursing facility placement.  Assessment and Plan:   Hypertensive Emergency:  Resolved.  Blood pressure stable on on amlodipine and Coreg.   Acute hypoxic respiratory failure in the setting of acute pulmonary edema Acute on chronic HFpEF Improved at this time.   On room air.  Continue p.o. Lasix.  Last creatinine of 2.1.  NSTEMI;  EKG showed ST and T wave changes with elevated troponin.  History of refusing cath in the past and refused at this time as well.  Continue  aspirin, Plavix, Coreg and Crestor, Ranexa. Cardiology followed the patient during hospitalization and will need outpatient follow-up.  Denies further chest pain.     Anemia of chronic kidney disease Patient received 1 unit of packed RBC for hemoglobin of 6.9 on 01/02/2023.  Latest hemoglobin at 8.1.  No signs of bleeding.  Has mild thrombocytopenia , improving. Latest platelet of 110  Acute kidney injury on stage 4 CKD:  Latest creatinine at 2.1 , from 2.0.  Stable at this time.  Acute COPD exacerbation. Continue DuoNebs,  incentive spirometry, on room air.  Compensated    Thrombocytopenia:  Improving. No bleeding. .    DVT prophylaxis: Place and maintain sequential compression device Start: 01/03/23 1147SCD.   Code Status:     Code Status: DNR  Disposition:  Skilled nursing facility when bed available. Medically stable for disposition.  Status is: Inpatient  Remains inpatient appropriate because: Pending skilled nursing facility placement,   Family Communication: Spoke with the patient's niece and HPOA on the phone and updated her about the clinical condition of the patient on 01/08/23   Consultants:  Cardiology PCCM  Procedures:  Transfusion of 1 unit of packed RBC  Antimicrobials:  None   Subjective:  Today, patient was seen and examined at bedside.  Denies any shortness of breath chest pain fever chills or rigor.  Awaiting for skilled nursing facility.    Objective: Vitals:   01/10/23 0045 01/10/23 0426 01/10/23 0744 01/10/23 1116  BP:   (!) 131/52 (!) 104/45  Pulse:   61 (!) 53  Resp: 20 18 18 18   Temp: 97.8 F (36.6 C) 97.8 F (36.6 C) 98.4 F (36.9 C) (!) 97.5 F (36.4 C)  TempSrc: Oral Oral Oral Oral  SpO2:    97%  Weight:  45.5 kg      Intake/Output Summary (Last 24 hours) at 01/10/2023 1454 Last data filed at 01/10/2023 0850 Gross per 24 hour  Intake 360 ml  Output 250 ml  Net 110 ml   Filed Weights   01/08/23 0519 01/09/23 0438 01/10/23 0426  Weight:  46 kg 46 kg 45.5 kg    Physical Examination: Body mass index is 15.71 kg/m.   General: Thinly built, not in obvious distress, elderly female HENT:   No scleral pallor or icterus noted. Oral mucosa is moist.  Chest:  Clear breath sounds.  No crackles or wheezes.  CVS: S1 &S2 heard. No murmur.  Regular rate and rhythm. Abdomen: Soft, nontender, nondistended.  Bowel sounds are heard.   Extremities: No cyanosis, clubbing or edema.  Peripheral pulses are palpable. Psych: Alert, awake and oriented, normal  mood CNS:  No cranial nerve deficits.  Moves all extremities. Skin: Warm and dry.  No rashes noted.   Data Reviewed:   CBC: Recent Labs  Lab 01/04/23 0056 01/05/23 0019 01/07/23 0108 01/10/23 0104  WBC 4.4 3.8* 4.1 4.0  HGB 8.0* 8.4* 8.1* 8.1*  HCT 24.6* 25.8* 24.8* 24.6*  MCV 88.5 89.3 88.6 90.8  PLT 90* 102* 105* 110*    Basic Metabolic Panel: Recent Labs  Lab 01/04/23 0056 01/07/23 0108 01/10/23 0104  NA 135 132* 132*  K 4.3 4.5 4.6  CL 104 102 102  CO2 24 25 24   GLUCOSE 80 90 83  BUN 61* 47* 49*  CREATININE 1.86* 2.03* 2.17*  CALCIUM 8.4* 8.2* 8.2*  MG 2.1  --   --     Liver Function Tests: No results for input(s): "AST", "ALT", "ALKPHOS", "BILITOT", "PROT", "ALBUMIN" in the last 168 hours.   Radiology Studies: No results found.    LOS: 10 days    Joycelyn Das, MD [facility] Available via Epic secure chat 7am-7pm After these hours, please refer to coverage provider listed on [email or web address] 01/10/2023, 2:54 PM

## 2023-01-10 NOTE — TOC Transition Note (Signed)
Transition of Care [facility]) - CM/SW Discharge Note   Patient Details  Name: Lisa [name] MRN: [id number] Date of Birth: 02-Jul-1934  Transition of Care [facility]) CM/SW Contact:  Leander Rams, LCSW Phone Number: 01/10/2023, 4:12 PM   Clinical Narrative:    Patient will DC to: [facility]  Anticipated DC date: 01/10/2023 Family notified: Bronson Ing Transport by: Sharin Mons   Per MD patient ready for DC to [facility]. RN, patient, patient's family, and facility notified of DC. Discharge Summary and FL2 sent to facility. RN to call report prior to discharge [phone]. DC packet on chart. Ambulance transport requested for patient.   CSW will sign off for now as social work intervention is no longer needed. Please consult [place] again if new needs arise.    Final next level of care: Skilled Nursing Facility Barriers to Discharge: No Barriers Identified   Patient Goals and CMS Choice      Discharge Placement                Patient chooses bed at:  [facility]) Patient to be transferred to facility by: [facility] Name of family member notified: Yvette Patient and family notified of of transfer: 01/10/23  Discharge Plan and Services Additional resources added to the After Visit Summary for                              [facility] Agency: [facility]        Social Determinants of Health (SDOH) Interventions SDOH Screenings   Food Insecurity: Patient Declined (12/19/2022)  Housing: Low Risk  (12/19/2022)  Transportation Needs: No Transportation Needs (12/19/2022)  Utilities: Not At Risk (12/19/2022)  Alcohol Screen: Low Risk  (10/24/2022)  Depression (PHQ2-9): Low Risk  (12/12/2022)  Financial Resource Strain: Low Risk  (10/24/2022)  Physical Activity: Inactive (10/24/2022)  Social Connections: Moderately Isolated (12/01/2022)  Stress: No Stress Concern Present (12/01/2022)  Tobacco Use: Medium Risk (01/08/2023)     Readmission Risk Interventions    12/17/2022    3:16 PM 11/16/2022     1:13 PM 09/06/2021    9:23 AM  Readmission Risk Prevention Plan  Transportation Screening  Complete Complete  PCP or Specialist Appt within 3-5 Days   Complete  HRI or Home Care Consult  Complete Complete  Social Work Consult for Recovery Care Planning/Counseling  Complete Complete  Palliative Care Screening  Complete Not Applicable  Medication Review Oceanographer) Complete Complete Complete  PCP or Specialist appointment within 3-5 days of discharge Complete       Oletta Lamas, MSW, LCSWA, LCASA Transitions of Care  Clinical Social Worker I

## 2023-01-10 NOTE — Plan of Care (Signed)
  Problem: Activity: Goal: Risk for activity intolerance will decrease Outcome: Progressing   Problem: Nutrition: Goal: Adequate nutrition will be maintained Outcome: Progressing   Problem: Elimination: Goal: Will not experience complications related to bowel motility Outcome: Progressing   Problem: Elimination: Goal: Will not experience complications related to urinary retention Outcome: Progressing   Problem: Safety: Goal: Ability to remain free from injury will improve Outcome: Progressing

## 2023-01-10 NOTE — Discharge Summary (Signed)
Physician Discharge Summary  Lisa [name] NFA:[id number] DOB: February 24, 1935 DOA: 12/31/2022  PCP: [name], MD  Admit date: 12/31/2022 Discharge date: 01/10/2023  Admitted From: Home  Discharge disposition: SNF   Recommendations for Outpatient Follow-Up:   Follow up with your primary care provider at the SNF in 3-5 days Check CBC, BMP, magnesium in the next visit Follow up with cardiology as outpatient in 2 weeks. Need to schedule   Discharge Diagnosis:   Principal Problem:   Hypertensive emergency Active Problems:   Hypertension   COPD (chronic obstructive pulmonary disease) (HCC)   COPD exacerbation (HCC)   Acute respiratory failure with hypoxia (HCC)   Discharge Condition: Improved.  Diet recommendation: Low sodium, heart healthy.    Wound care: None.  Code status:  DNR   History of Present Illness:   88 year old female  with past medical history significant for HTN, CAD, COPD, lung CA status post radiation, CKD stage 3b, MGUS presented to the hospital with shortness of breath and chest tightness.  Patient  with previous admission x 2 for non-ST elevation MI when she had refused cardiac catheterization , managed medically.  On this admission, she was initially put on BiPAP for increased work of breathing.   EKG was significant for diffuse ST changes and cardiology was consulted. Nitroglycerin infusion was started for chest pain and blood pressure control. PCCM was asked to admit to ICU for hypertensive emergency.  Patient was subsequently weaned off nitroglycerin drip and was transferred out of the ICU.   Hospital Course:   Following conditions were addressed during hospitalization as listed below,  Hypertensive Emergency:  Resolved.  Blood pressure stable on on amlodipine and Coreg.   Acute hypoxic respiratory failure in the setting of acute pulmonary edema Acute on chronic HFpEF Improved at this time.   On room air.  Continue p.o. Lasix.  Last  creatinine of 2.1.   NSTEMI;  EKG showed ST and T wave changes with elevated troponin.  History of refusing cath in the past and refused at this time as well.  Continue  aspirin, Plavix, Coreg and Crestor, Ranexa. Cardiology followed the patient during hospitalization and will need outpatient follow-up.  Denies further chest pain.     Anemia of chronic kidney disease Patient received 1 unit of packed RBC for hemoglobin of 6.9 on 01/02/2023.  Latest hemoglobin at 8.1.  No signs of bleeding.  Has mild thrombocytopenia , improving. Latest platelet of 110 follow-up as outpatient.   Acute kidney injury on stage 4 CKD:  Latest creatinine at 2.1 , from 2.0.  Stable at this time.     Acute COPD exacerbation. Received DuoNebs, incentive spirometry, on room air.  Compensated will continue DuoNebs and inhalers on discharge.    Thrombocytopenia:  Improving. No bleeding.  Check CBC in the next visit.Marland Kitchen   Disposition.  At this time, patient is stable for disposition to skilled nursing facility.  Would benefit from cardiology and nephrology follow-up as outpatient.  Medical Consultants:   Cardiology PCCM  Procedures:    Transfusion of 1 unit of packed RBC. Subjective:   Today, patient was seen and examined at bedside.  Denies any chest pain, shortness of breath, fever, chills, rigors.  Discharge Exam:   Vitals:   01/10/23 0744 01/10/23 1116  BP: (!) 131/52 (!) 104/45  Pulse: 61 (!) 53  Resp: 18 18  Temp: 98.4 F (36.9 C) (!) 97.5 F (36.4 C)  SpO2:  97%   Vitals:  01/10/23 0045 01/10/23 0426 01/10/23 0744 01/10/23 1116  BP:   (!) 131/52 (!) 104/45  Pulse:   61 (!) 53  Resp: 20 18 18 18   Temp: 97.8 F (36.6 C) 97.8 F (36.6 C) 98.4 F (36.9 C) (!) 97.5 F (36.4 C)  TempSrc: Oral Oral Oral Oral  SpO2:    97%  Weight:  45.5 kg     Thinly built, not in obvious distress, elderly female HENT:   No scleral pallor or icterus noted. Oral mucosa is moist.  Chest:  Clear breath  sounds.  No crackles or wheezes.  CVS: S1 &S2 heard. No murmur.  Regular rate and rhythm. Abdomen: Soft, nontender, nondistended.  Bowel sounds are heard.   Extremities: No cyanosis, clubbing or edema.  Peripheral pulses are palpable. Psych: Alert, awake and oriented, normal mood CNS:  No cranial nerve deficits.  Moves all extremities. Skin: Warm and dry.  No rashes noted.  The results of significant diagnostics from this hospitalization (including imaging, microbiology, ancillary and laboratory) are listed below for reference.     Diagnostic Studies:   DG Chest Portable 1 View  Result Date: 12/31/2022 CLINICAL DATA:  Shortness of breath and chest pain, initial encounter EXAM: PORTABLE CHEST 1 VIEW COMPARISON:  12/16/2022 FINDINGS: Cardiac shadow is stable. Persistent left apical mass is noted with fiducial markers in place. Lungs are well aerated. Persistent upper lobe opacities are seen which correspond to ground-glass opacities on recent CT examination. No new focal abnormality is noted. IMPRESSION: Overall stable appearance of the chest when compared with the prior exam. Electronically Signed   By: Alcide Clever M.D.   On: 12/31/2022 20:33     Labs:   Basic Metabolic Panel: Recent Labs  Lab 01/04/23 0056 01/07/23 0108 01/10/23 0104  NA 135 132* 132*  K 4.3 4.5 4.6  CL 104 102 102  CO2 24 25 24   GLUCOSE 80 90 83  BUN 61* 47* 49*  CREATININE 1.86* 2.03* 2.17*  CALCIUM 8.4* 8.2* 8.2*  MG 2.1  --   --    GFR Estimated Creatinine Clearance: 13.6 mL/min (A) (by C-G formula based on SCr of 2.17 mg/dL (H)). Liver Function Tests: No results for input(s): "AST", "ALT", "ALKPHOS", "BILITOT", "PROT", "ALBUMIN" in the last 168 hours. No results for input(s): "LIPASE", "AMYLASE" in the last 168 hours. No results for input(s): "AMMONIA" in the last 168 hours. Coagulation profile No results for input(s): "INR", "PROTIME" in the last 168 hours.  CBC: Recent Labs  Lab 01/04/23 0056  01/05/23 0019 01/07/23 0108 01/10/23 0104  WBC 4.4 3.8* 4.1 4.0  HGB 8.0* 8.4* 8.1* 8.1*  HCT 24.6* 25.8* 24.8* 24.6*  MCV 88.5 89.3 88.6 90.8  PLT 90* 102* 105* 110*   Cardiac Enzymes: No results for input(s): "CKTOTAL", "CKMB", "CKMBINDEX", "TROPONINI" in the last 168 hours. BNP: Invalid input(s): "POCBNP" CBG: No results for input(s): "GLUCAP" in the last 168 hours. D-Dimer No results for input(s): "DDIMER" in the last 72 hours. Hgb A1c No results for input(s): "HGBA1C" in the last 72 hours. Lipid Profile No results for input(s): "CHOL", "HDL", "LDLCALC", "TRIG", "CHOLHDL", "LDLDIRECT" in the last 72 hours. Thyroid function studies No results for input(s): "TSH", "T4TOTAL", "T3FREE", "THYROIDAB" in the last 72 hours.  Invalid input(s): "FREET3" Anemia work up No results for input(s): "VITAMINB12", "FOLATE", "FERRITIN", "TIBC", "IRON", "RETICCTPCT" in the last 72 hours. Microbiology Recent Results (from the past 240 hour(s))  Blood culture (routine x 2)     Status: None  Collection Time: 12/31/22  8:45 PM   Specimen: BLOOD  Result Value Ref Range Status   Specimen Description BLOOD LEFT ANTECUBITAL  Final   Special Requests   Final    BOTTLES DRAWN AEROBIC AND ANAEROBIC Blood Culture results may not be optimal due to an excessive volume of blood received in culture bottles   Culture   Final    NO GROWTH 5 DAYS Performed at [facility], [address] [place], [place] [address]    Report Status 01/05/2023 FINAL  Final  Resp panel by RT-PCR (RSV, Flu A&B, Covid) Anterior Nasal Swab     Status: None   Collection Time: 12/31/22  9:00 PM   Specimen: Anterior Nasal Swab  Result Value Ref Range Status   SARS Coronavirus 2 by RT PCR NEGATIVE NEGATIVE Final   Influenza A by PCR NEGATIVE NEGATIVE Final   Influenza B by PCR NEGATIVE NEGATIVE Final    Comment: (NOTE) The Xpert Xpress SARS-CoV-2/FLU/RSV plus assay is intended as an aid in the diagnosis of influenza  from Nasopharyngeal swab specimens and should not be used as a sole basis for treatment. Nasal washings and aspirates are unacceptable for Xpert Xpress SARS-CoV-2/FLU/RSV testing.  Fact Sheet for Patients: BloggerCourse.com  Fact Sheet for Healthcare Providers: SeriousBroker.it  This test is not yet approved or cleared by the [place] FDA and has been authorized for detection and/or diagnosis of SARS-CoV-2 by FDA under an Emergency Use Authorization (EUA). This EUA will remain in effect (meaning this test can be used) for the duration of the COVID-19 declaration under Section 564(b)(1) of the Act, 21 U.S.C. section 360bbb-3(b)(1), unless the authorization is terminated or revoked.     Resp Syncytial Virus by PCR NEGATIVE NEGATIVE Final    Comment: (NOTE) Fact Sheet for Patients: BloggerCourse.com  Fact Sheet for Healthcare Providers: SeriousBroker.it  This test is not yet approved or cleared by the [place] FDA and has been authorized for detection and/or diagnosis of SARS-CoV-2 by FDA under an Emergency Use Authorization (EUA). This EUA will remain in effect (meaning this test can be used) for the duration of the COVID-19 declaration under Section 564(b)(1) of the Act, 21 U.S.C. section 360bbb-3(b)(1), unless the authorization is terminated or revoked.  Performed at [facility], [address] [place], [place] [address]   Blood culture (routine x 2)     Status: None   Collection Time: 12/31/22 10:18 PM   Specimen: BLOOD LEFT HAND  Result Value Ref Range Status   Specimen Description BLOOD LEFT HAND  Final   Special Requests   Final    BOTTLES DRAWN AEROBIC AND ANAEROBIC Blood Culture adequate volume   Culture   Final    NO GROWTH 5 DAYS Performed at [facility], [address], [place], [place] [address]    Report Status 01/05/2023 FINAL  Final   MRSA Next Gen by PCR, Nasal     Status: None   Collection Time: 12/31/22 11:05 PM   Specimen: Nasal Mucosa; Nasal Swab  Result Value Ref Range Status   MRSA by PCR Next Gen NOT DETECTED NOT DETECTED Final    Comment: (NOTE) The GeneXpert MRSA Assay (FDA approved for NASAL specimens only), is one component of a comprehensive MRSA colonization surveillance program. It is not intended to diagnose MRSA infection nor to guide or monitor treatment for MRSA infections. Test performance is not FDA approved in patients less than 39 years old. Performed at [facility], [address], [place], [place] [address]      Discharge Instructions:   Discharge Instructions     Diet - low sodium heart healthy   Complete by: As directed    Discharge instructions   Complete by: As directed    Follow-up with your primary care at the nursing facility in 3 to 5 days.  Check blood work at that time.  Take medications as prescribed.   Increase activity slowly   Complete by: As directed       Allergies as of 01/10/2023   No Known Allergies      Medication List     STOP taking these medications    metoprolol tartrate 25 MG tablet Commonly known as: LOPRESSOR       TAKE these medications    amLODipine 10 MG tablet Commonly known as: NORVASC Take 1 tablet (10 mg total) by mouth daily.   aspirin EC 81 MG tablet Take 81 mg by mouth daily.   carvedilol 12.5 MG tablet Commonly known as: COREG Take 1 tablet (12.5 mg total) by mouth 2 (two) times daily with a meal.   clopidogrel 75 MG tablet Commonly known as: PLAVIX Take 1 tablet (75 mg total) by mouth daily.   docusate sodium 100 MG capsule Commonly known as: COLACE Take 1 capsule (100 mg total) by mouth 2 (two) times daily as needed for mild constipation.   feeding supplement Liqd Take 237 mLs by mouth 2 (two) times daily between meals.   furosemide 20 MG tablet Commonly known as: LASIX Take 1 tablet (20 mg total) by  mouth daily.   ipratropium-albuterol 0.5-2.5 (3) MG/3ML Soln Commonly known as: DUONEB Take 3 mLs by nebulization every 4 (four) hours as needed (wheezing/shortness of breath).   Combivent Respimat 20-100 MCG/ACT Aers respimat Generic drug: Ipratropium-Albuterol Inhale 1 puff into the lungs every 6 (six) hours as needed for wheezing or shortness of breath.   Iron (Ferrous Sulfate) 325 (65 Fe) MG Tabs Take 325 mg by mouth daily.   isosorbide mononitrate 60 MG 24 hr tablet Commonly known as: IMDUR Take 1.5 tablets (90 mg total) by mouth daily.   multivitamin with minerals Tabs tablet Take 1 tablet by mouth daily.   nitroGLYCERIN 0.4 MG SL tablet Commonly known as: NITROSTAT Place 1 tablet (0.4 mg total) under the tongue every 5 (five) minutes as needed for chest pain.   ranolazine 500 MG 12 hr tablet Commonly known as: RANEXA Take 1 tablet (500 mg total) by mouth 2 (two) times daily.   rosuvastatin 20 MG tablet Commonly known as: CRESTOR Take 1 tablet (20 mg total) by mouth daily at 6 PM.   TUMS PO Take 1 tablet by mouth daily.   Vitamin D3 125 MCG (5000 UT) Caps Take 5,000 Units by mouth daily.        Follow-up Information     Tobb, Kardie, DO. Schedule an appointment as soon as possible for a visit in 2 week(s).   Specialty: Cardiology Contact information: [address] [place] [place] [address] [phone]                  Time coordinating discharge: 39 minutes  Signed:  Aviah Sorci  [facility] 01/10/2023, 3:54 PM

## 2023-01-11 DIAGNOSIS — I5033 Acute on chronic diastolic (congestive) heart failure: Secondary | ICD-10-CM | POA: Diagnosis not present

## 2023-01-11 DIAGNOSIS — I2510 Atherosclerotic heart disease of native coronary artery without angina pectoris: Secondary | ICD-10-CM | POA: Diagnosis not present

## 2023-01-11 DIAGNOSIS — I10 Essential (primary) hypertension: Secondary | ICD-10-CM | POA: Diagnosis not present

## 2023-01-11 DIAGNOSIS — J449 Chronic obstructive pulmonary disease, unspecified: Secondary | ICD-10-CM | POA: Diagnosis not present

## 2023-01-11 DIAGNOSIS — N184 Chronic kidney disease, stage 4 (severe): Secondary | ICD-10-CM | POA: Diagnosis not present

## 2023-01-12 ENCOUNTER — Telehealth: Payer: Self-pay | Admitting: Internal Medicine

## 2023-01-12 DIAGNOSIS — Z1388 Encounter for screening for disorder due to exposure to contaminants: Secondary | ICD-10-CM | POA: Diagnosis not present

## 2023-01-12 DIAGNOSIS — J449 Chronic obstructive pulmonary disease, unspecified: Secondary | ICD-10-CM | POA: Diagnosis not present

## 2023-01-12 DIAGNOSIS — I5033 Acute on chronic diastolic (congestive) heart failure: Secondary | ICD-10-CM | POA: Diagnosis not present

## 2023-01-12 DIAGNOSIS — I2510 Atherosclerotic heart disease of native coronary artery without angina pectoris: Secondary | ICD-10-CM | POA: Diagnosis not present

## 2023-01-12 DIAGNOSIS — I10 Essential (primary) hypertension: Secondary | ICD-10-CM | POA: Diagnosis not present

## 2023-01-12 DIAGNOSIS — N184 Chronic kidney disease, stage 4 (severe): Secondary | ICD-10-CM | POA: Diagnosis not present

## 2023-01-12 DIAGNOSIS — R799 Abnormal finding of blood chemistry, unspecified: Secondary | ICD-10-CM | POA: Diagnosis not present

## 2023-01-12 NOTE — Telephone Encounter (Signed)
Patient niece calling to inform that the patient is now admitted to [facility].

## 2023-01-15 DIAGNOSIS — I5033 Acute on chronic diastolic (congestive) heart failure: Secondary | ICD-10-CM | POA: Diagnosis not present

## 2023-01-15 DIAGNOSIS — I2510 Atherosclerotic heart disease of native coronary artery without angina pectoris: Secondary | ICD-10-CM | POA: Diagnosis not present

## 2023-01-15 DIAGNOSIS — I10 Essential (primary) hypertension: Secondary | ICD-10-CM | POA: Diagnosis not present

## 2023-01-15 DIAGNOSIS — J449 Chronic obstructive pulmonary disease, unspecified: Secondary | ICD-10-CM | POA: Diagnosis not present

## 2023-01-15 DIAGNOSIS — N184 Chronic kidney disease, stage 4 (severe): Secondary | ICD-10-CM | POA: Diagnosis not present

## 2023-01-15 DIAGNOSIS — M6281 Muscle weakness (generalized): Secondary | ICD-10-CM | POA: Diagnosis not present

## 2023-01-16 ENCOUNTER — Ambulatory Visit: Payer: Medicare Other | Admitting: Internal Medicine

## 2023-01-17 DIAGNOSIS — I808 Phlebitis and thrombophlebitis of other sites: Secondary | ICD-10-CM | POA: Diagnosis not present

## 2023-01-17 DIAGNOSIS — I10 Essential (primary) hypertension: Secondary | ICD-10-CM | POA: Diagnosis not present

## 2023-01-17 DIAGNOSIS — I5033 Acute on chronic diastolic (congestive) heart failure: Secondary | ICD-10-CM | POA: Diagnosis not present

## 2023-01-17 DIAGNOSIS — N184 Chronic kidney disease, stage 4 (severe): Secondary | ICD-10-CM | POA: Diagnosis not present

## 2023-01-17 DIAGNOSIS — J449 Chronic obstructive pulmonary disease, unspecified: Secondary | ICD-10-CM | POA: Diagnosis not present

## 2023-01-17 DIAGNOSIS — M6281 Muscle weakness (generalized): Secondary | ICD-10-CM | POA: Diagnosis not present

## 2023-01-17 DIAGNOSIS — I2510 Atherosclerotic heart disease of native coronary artery without angina pectoris: Secondary | ICD-10-CM | POA: Diagnosis not present

## 2023-01-20 ENCOUNTER — Emergency Department (HOSPITAL_COMMUNITY): Payer: Medicare Other

## 2023-01-20 ENCOUNTER — Inpatient Hospital Stay (HOSPITAL_COMMUNITY): Payer: Medicare Other

## 2023-01-20 ENCOUNTER — Encounter (HOSPITAL_COMMUNITY): Payer: Self-pay

## 2023-01-20 ENCOUNTER — Inpatient Hospital Stay (HOSPITAL_COMMUNITY)
Admission: EM | Admit: 2023-01-20 | Discharge: 2023-01-23 | DRG: 190 | Disposition: A | Discharge status: Hospice | Payer: Medicare Other | Attending: Family Medicine | Admitting: Family Medicine

## 2023-01-20 DIAGNOSIS — J9602 Acute respiratory failure with hypercapnia: Secondary | ICD-10-CM | POA: Diagnosis present

## 2023-01-20 DIAGNOSIS — I252 Old myocardial infarction: Secondary | ICD-10-CM

## 2023-01-20 DIAGNOSIS — N184 Chronic kidney disease, stage 4 (severe): Secondary | ICD-10-CM | POA: Diagnosis present

## 2023-01-20 DIAGNOSIS — R636 Underweight: Secondary | ICD-10-CM | POA: Diagnosis not present

## 2023-01-20 DIAGNOSIS — E8729 Other acidosis: Secondary | ICD-10-CM | POA: Diagnosis not present

## 2023-01-20 DIAGNOSIS — R0603 Acute respiratory distress: Secondary | ICD-10-CM | POA: Diagnosis not present

## 2023-01-20 DIAGNOSIS — I2489 Other forms of acute ischemic heart disease: Secondary | ICD-10-CM | POA: Diagnosis present

## 2023-01-20 DIAGNOSIS — Z515 Encounter for palliative care: Secondary | ICD-10-CM | POA: Diagnosis not present

## 2023-01-20 DIAGNOSIS — I5032 Chronic diastolic (congestive) heart failure: Secondary | ICD-10-CM | POA: Diagnosis not present

## 2023-01-20 DIAGNOSIS — Z90710 Acquired absence of both cervix and uterus: Secondary | ICD-10-CM | POA: Diagnosis not present

## 2023-01-20 DIAGNOSIS — Z8249 Family history of ischemic heart disease and other diseases of the circulatory system: Secondary | ICD-10-CM | POA: Diagnosis not present

## 2023-01-20 DIAGNOSIS — D631 Anemia in chronic kidney disease: Secondary | ICD-10-CM | POA: Diagnosis present

## 2023-01-20 DIAGNOSIS — J9601 Acute respiratory failure with hypoxia: Secondary | ICD-10-CM | POA: Insufficient documentation

## 2023-01-20 DIAGNOSIS — I10 Essential (primary) hypertension: Secondary | ICD-10-CM | POA: Diagnosis present

## 2023-01-20 DIAGNOSIS — Z681 Body mass index (BMI) 19 or less, adult: Secondary | ICD-10-CM

## 2023-01-20 DIAGNOSIS — R0602 Shortness of breath: Secondary | ICD-10-CM | POA: Diagnosis not present

## 2023-01-20 DIAGNOSIS — Z79899 Other long term (current) drug therapy: Secondary | ICD-10-CM | POA: Diagnosis not present

## 2023-01-20 DIAGNOSIS — R600 Localized edema: Secondary | ICD-10-CM | POA: Diagnosis not present

## 2023-01-20 DIAGNOSIS — T502X5A Adverse effect of carbonic-anhydrase inhibitors, benzothiadiazides and other diuretics, initial encounter: Secondary | ICD-10-CM | POA: Diagnosis present

## 2023-01-20 DIAGNOSIS — R0600 Dyspnea, unspecified: Principal | ICD-10-CM

## 2023-01-20 DIAGNOSIS — R778 Other specified abnormalities of plasma proteins: Secondary | ICD-10-CM | POA: Diagnosis not present

## 2023-01-20 DIAGNOSIS — Z1152 Encounter for screening for COVID-19: Secondary | ICD-10-CM

## 2023-01-20 DIAGNOSIS — Z66 Do not resuscitate: Secondary | ICD-10-CM | POA: Diagnosis not present

## 2023-01-20 DIAGNOSIS — I130 Hypertensive heart and chronic kidney disease with heart failure and stage 1 through stage 4 chronic kidney disease, or unspecified chronic kidney disease: Secondary | ICD-10-CM | POA: Diagnosis not present

## 2023-01-20 DIAGNOSIS — Z7982 Long term (current) use of aspirin: Secondary | ICD-10-CM

## 2023-01-20 DIAGNOSIS — R918 Other nonspecific abnormal finding of lung field: Secondary | ICD-10-CM | POA: Diagnosis not present

## 2023-01-20 DIAGNOSIS — D696 Thrombocytopenia, unspecified: Secondary | ICD-10-CM | POA: Diagnosis present

## 2023-01-20 DIAGNOSIS — C3490 Malignant neoplasm of unspecified part of unspecified bronchus or lung: Secondary | ICD-10-CM | POA: Diagnosis not present

## 2023-01-20 DIAGNOSIS — I700 Atherosclerosis of aorta: Secondary | ICD-10-CM | POA: Diagnosis not present

## 2023-01-20 DIAGNOSIS — Z923 Personal history of irradiation: Secondary | ICD-10-CM

## 2023-01-20 DIAGNOSIS — Z7902 Long term (current) use of antithrombotics/antiplatelets: Secondary | ICD-10-CM | POA: Diagnosis not present

## 2023-01-20 DIAGNOSIS — Z743 Need for continuous supervision: Secondary | ICD-10-CM | POA: Diagnosis not present

## 2023-01-20 DIAGNOSIS — R7989 Other specified abnormal findings of blood chemistry: Secondary | ICD-10-CM

## 2023-01-20 DIAGNOSIS — Z87891 Personal history of nicotine dependence: Secondary | ICD-10-CM | POA: Diagnosis not present

## 2023-01-20 DIAGNOSIS — J441 Chronic obstructive pulmonary disease with (acute) exacerbation: Principal | ICD-10-CM | POA: Diagnosis present

## 2023-01-20 DIAGNOSIS — J988 Other specified respiratory disorders: Secondary | ICD-10-CM | POA: Diagnosis not present

## 2023-01-20 LAB — COMPREHENSIVE METABOLIC PANEL
ALT: 30 U/L (ref 0–44)
AST: 37 U/L (ref 15–41)
Albumin: 3.4 g/dL — ABNORMAL LOW (ref 3.5–5.0)
Alkaline Phosphatase: 42 U/L (ref 38–126)
Anion gap: 5 (ref 5–15)
BUN: 48 mg/dL — ABNORMAL HIGH (ref 8–23)
CO2: 27 mmol/L (ref 22–32)
Calcium: 8.9 mg/dL (ref 8.9–10.3)
Chloride: 104 mmol/L (ref 98–111)
Creatinine, Ser: 2.19 mg/dL — ABNORMAL HIGH (ref 0.44–1.00)
GFR, Estimated: 21 mL/min — ABNORMAL LOW (ref >60)
Glucose, Bld: 169 mg/dL — ABNORMAL HIGH (ref 70–99)
Potassium: 4.7 mmol/L (ref 3.5–5.1)
Sodium: 136 mmol/L (ref 135–145)
Total Bilirubin: 0.5 mg/dL (ref 0.3–1.2)
Total Protein: 8.6 g/dL — ABNORMAL HIGH (ref 6.5–8.1)

## 2023-01-20 LAB — CBC WITH DIFFERENTIAL/PLATELET
Abs Immature Granulocytes: 0.02 10*3/uL (ref 0.00–0.07)
Basophils Absolute: 0 10*3/uL (ref 0.0–0.1)
Basophils Relative: 0 %
Eosinophils Absolute: 0 10*3/uL (ref 0.0–0.5)
Eosinophils Relative: 0 %
HCT: 25.2 % — ABNORMAL LOW (ref 36.0–46.0)
Hemoglobin: 7.9 g/dL — ABNORMAL LOW (ref 12.0–15.0)
Immature Granulocytes: 0 %
Lymphocytes Relative: 15 %
Lymphs Abs: 0.9 10*3/uL (ref 0.7–4.0)
MCH: 30 pg (ref 26.0–34.0)
MCHC: 31.3 g/dL (ref 30.0–36.0)
MCV: 95.8 fL (ref 80.0–100.0)
Monocytes Absolute: 0.4 10*3/uL (ref 0.1–1.0)
Monocytes Relative: 6 %
Neutro Abs: 4.6 10*3/uL (ref 1.7–7.7)
Neutrophils Relative %: 79 %
Platelets: 125 10*3/uL — ABNORMAL LOW (ref 150–400)
RBC: 2.63 MIL/uL — ABNORMAL LOW (ref 3.87–5.11)
RDW: 18.5 % — ABNORMAL HIGH (ref 11.5–15.5)
WBC: 5.8 10*3/uL (ref 4.0–10.5)
nRBC: 0 % (ref 0.0–0.2)

## 2023-01-20 LAB — RESPIRATORY PANEL BY PCR

## 2023-01-20 LAB — TROPONIN I (HIGH SENSITIVITY)
Troponin I (High Sensitivity): 33 ng/L — ABNORMAL HIGH (ref <18)
Troponin I (High Sensitivity): 71 ng/L — ABNORMAL HIGH (ref <18)
Troponin I (High Sensitivity): 120 ng/L (ref <18)

## 2023-01-20 LAB — RESP PANEL BY RT-PCR (RSV, FLU A&B, COVID)  RVPGX2
Influenza A by PCR: NEGATIVE
Influenza B by PCR: NEGATIVE
Resp Syncytial Virus by PCR: NEGATIVE
SARS Coronavirus 2 by RT PCR: NEGATIVE

## 2023-01-20 LAB — PROCALCITONIN: Procalcitonin: <0.1 ng/mL

## 2023-01-20 LAB — BLOOD GAS, ARTERIAL
Acid-base deficit: 3.9 mmol/L — ABNORMAL HIGH (ref 0.0–2.0)
Bicarbonate: 26.8 mmol/L (ref 20.0–28.0)
Drawn by: 22179
O2 Saturation: 86.1 %
Patient temperature: 36.1
pCO2 arterial: 74 mmHg (ref 32–48)
pH, Arterial: 7.16 — CL (ref 7.35–7.45)
pO2, Arterial: 55 mmHg — ABNORMAL LOW (ref 83–108)

## 2023-01-20 LAB — MAGNESIUM: Magnesium: 1.9 mg/dL (ref 1.7–2.4)

## 2023-01-20 LAB — GLUCOSE, CAPILLARY: Glucose-Capillary: 125 mg/dL — ABNORMAL HIGH (ref 70–99)

## 2023-01-20 LAB — BRAIN NATRIURETIC PEPTIDE: B Natriuretic Peptide: 1079 pg/mL — ABNORMAL HIGH (ref 0.0–100.0)

## 2023-01-20 LAB — MRSA NEXT GEN BY PCR, NASAL: MRSA by PCR Next Gen: NOT DETECTED

## 2023-01-20 MED ORDER — ISOSORBIDE MONONITRATE ER 60 MG PO TB24
90.0000 mg | ORAL_TABLET | Freq: Every day | ORAL | Status: DC
  Administered 2023-01-20 – 2023-01-22 (×3): 90 mg via ORAL
  Filled 2023-01-20 (×3): qty 1

## 2023-01-20 MED ORDER — METHYLPREDNISOLONE SODIUM SUCC 125 MG IJ SOLR: 125.0000 mg | Freq: Two times a day (BID) | INTRAMUSCULAR | Status: DC

## 2023-01-20 MED ORDER — ACETAMINOPHEN 650 MG RE SUPP: 650.0000 mg | Freq: Four times a day (QID) | RECTAL | Status: DC | PRN

## 2023-01-20 MED ORDER — SODIUM CHLORIDE 0.9 % IV SOLN
500.0000 mg | INTRAVENOUS | Status: AC
  Administered 2023-01-20: 500 mg via INTRAVENOUS
  Filled 2023-01-20: qty 5

## 2023-01-20 MED ORDER — PIPERACILLIN-TAZOBACTAM 3.375 G IVPB
3.3750 g | Freq: Three times a day (TID) | INTRAVENOUS | Status: DC
  Administered 2023-01-20 – 2023-01-21 (×3): 3.375 g via INTRAVENOUS
  Filled 2023-01-20 (×3): qty 50

## 2023-01-20 MED ORDER — IPRATROPIUM-ALBUTEROL 0.5-2.5 (3) MG/3ML IN SOLN
3.0000 mL | Freq: Once | RESPIRATORY_TRACT | Status: AC
  Administered 2023-01-20: 3 mL via RESPIRATORY_TRACT
  Filled 2023-01-20: qty 3

## 2023-01-20 MED ORDER — METHYLPREDNISOLONE SODIUM SUCC 125 MG IJ SOLR
125.0000 mg | Freq: Once | INTRAMUSCULAR | Status: AC
  Administered 2023-01-20: 125 mg via INTRAVENOUS
  Filled 2023-01-20: qty 2

## 2023-01-20 MED ORDER — ROSUVASTATIN CALCIUM 10 MG PO TABS
10.0000 mg | ORAL_TABLET | Freq: Every day | ORAL | Status: DC
  Administered 2023-01-21 – 2023-01-22 (×2): 10 mg via ORAL
  Filled 2023-01-20 (×3): qty 1

## 2023-01-20 MED ORDER — ARFORMOTEROL TARTRATE 15 MCG/2ML IN NEBU
15.0000 ug | INHALATION_SOLUTION | Freq: Two times a day (BID) | RESPIRATORY_TRACT | Status: DC
  Administered 2023-01-20 – 2023-01-22 (×6): 15 ug via RESPIRATORY_TRACT
  Filled 2023-01-20 (×6): qty 2

## 2023-01-20 MED ORDER — ACETAMINOPHEN 325 MG PO TABS: 650.0000 mg | ORAL_TABLET | Freq: Four times a day (QID) | ORAL | Status: DC | PRN

## 2023-01-20 MED ORDER — AMLODIPINE BESYLATE 5 MG PO TABS
10.0000 mg | ORAL_TABLET | Freq: Every day | ORAL | Status: DC
  Administered 2023-01-20 – 2023-01-22 (×3): 10 mg via ORAL
  Filled 2023-01-20 (×3): qty 2

## 2023-01-20 MED ORDER — IPRATROPIUM-ALBUTEROL 0.5-2.5 (3) MG/3ML IN SOLN
3.0000 mL | Freq: Four times a day (QID) | RESPIRATORY_TRACT | Status: DC
  Administered 2023-01-20 – 2023-01-22 (×10): 3 mL via RESPIRATORY_TRACT
  Filled 2023-01-20 (×10): qty 3

## 2023-01-20 MED ORDER — CHLORHEXIDINE GLUCONATE CLOTH 2 % EX PADS
6.0000 | MEDICATED_PAD | Freq: Every day | CUTANEOUS | Status: DC
  Administered 2023-01-20 – 2023-01-21 (×3): 6 via TOPICAL

## 2023-01-20 MED ORDER — OXYCODONE HCL 5 MG PO TABS
2.5000 mg | ORAL_TABLET | ORAL | Status: DC | PRN
  Administered 2023-01-20 – 2023-01-22 (×3): 2.5 mg via ORAL
  Filled 2023-01-20 (×3): qty 1

## 2023-01-20 MED ORDER — METHYLPREDNISOLONE SODIUM SUCC 125 MG IJ SOLR
60.0000 mg | Freq: Two times a day (BID) | INTRAMUSCULAR | Status: DC
  Administered 2023-01-20 – 2023-01-22 (×5): 60 mg via INTRAVENOUS
  Filled 2023-01-20 (×5): qty 2

## 2023-01-20 MED ORDER — IPRATROPIUM-ALBUTEROL 0.5-2.5 (3) MG/3ML IN SOLN: 3.0000 mL | Freq: Three times a day (TID) | RESPIRATORY_TRACT | Status: DC

## 2023-01-20 MED ORDER — ALBUTEROL SULFATE (2.5 MG/3ML) 0.083% IN NEBU
2.5000 mg | INHALATION_SOLUTION | RESPIRATORY_TRACT | Status: DC | PRN
  Administered 2023-01-22 – 2023-01-23 (×2): 2.5 mg via RESPIRATORY_TRACT
  Filled 2023-01-20 (×2): qty 3

## 2023-01-20 MED ORDER — HYDRALAZINE HCL 20 MG/ML IJ SOLN
10.0000 mg | Freq: Four times a day (QID) | INTRAMUSCULAR | Status: DC | PRN
  Administered 2023-01-22 (×2): 10 mg via INTRAVENOUS
  Filled 2023-01-20 (×2): qty 1

## 2023-01-20 MED ORDER — CLOPIDOGREL BISULFATE 75 MG PO TABS
75.0000 mg | ORAL_TABLET | Freq: Every day | ORAL | Status: DC
  Administered 2023-01-20 – 2023-01-22 (×3): 75 mg via ORAL
  Filled 2023-01-20 (×4): qty 1

## 2023-01-20 MED ORDER — FUROSEMIDE 20 MG PO TABS
20.0000 mg | ORAL_TABLET | Freq: Every day | ORAL | Status: DC
  Administered 2023-01-20: 20 mg via ORAL
  Filled 2023-01-20: qty 1

## 2023-01-20 MED ORDER — CARVEDILOL 12.5 MG PO TABS
12.5000 mg | ORAL_TABLET | Freq: Two times a day (BID) | ORAL | Status: DC
  Administered 2023-01-20 – 2023-01-22 (×6): 12.5 mg via ORAL
  Filled 2023-01-20 (×6): qty 1

## 2023-01-20 MED ORDER — AZITHROMYCIN 250 MG PO TABS: 500.0000 mg | ORAL_TABLET | Freq: Every day | ORAL | Status: DC

## 2023-01-20 MED ORDER — ASPIRIN 81 MG PO TBEC
81.0000 mg | DELAYED_RELEASE_TABLET | Freq: Every day | ORAL | Status: DC
  Administered 2023-01-20 – 2023-01-22 (×3): 81 mg via ORAL
  Filled 2023-01-20 (×3): qty 1

## 2023-01-20 MED ORDER — SODIUM CHLORIDE 0.9% FLUSH
3.0000 mL | Freq: Two times a day (BID) | INTRAVENOUS | Status: DC
  Administered 2023-01-20 – 2023-01-22 (×5): 3 mL via INTRAVENOUS

## 2023-01-20 MED ORDER — RANOLAZINE ER 500 MG PO TB12
500.0000 mg | ORAL_TABLET | Freq: Two times a day (BID) | ORAL | Status: DC
  Administered 2023-01-20 – 2023-01-22 (×5): 500 mg via ORAL
  Filled 2023-01-20 (×5): qty 1

## 2023-01-20 MED ORDER — HEPARIN SODIUM (PORCINE) 5000 UNIT/ML IJ SOLN
5000.0000 [IU] | Freq: Three times a day (TID) | INTRAMUSCULAR | Status: DC
  Administered 2023-01-20 – 2023-01-22 (×8): 5000 [IU] via SUBCUTANEOUS
  Filled 2023-01-20 (×8): qty 1

## 2023-01-20 MED ORDER — PREDNISONE 20 MG PO TABS: 40.0000 mg | ORAL_TABLET | Freq: Every day | ORAL | Status: DC

## 2023-01-20 MED ORDER — FUROSEMIDE 10 MG/ML IJ SOLN
40.0000 mg | Freq: Once | INTRAMUSCULAR | Status: AC
  Administered 2023-01-20: 40 mg via INTRAVENOUS
  Filled 2023-01-20: qty 4

## 2023-01-20 MED ORDER — BUDESONIDE 0.5 MG/2ML IN SUSP
0.5000 mg | Freq: Two times a day (BID) | RESPIRATORY_TRACT | Status: DC
  Administered 2023-01-20 – 2023-01-22 (×6): 0.5 mg via RESPIRATORY_TRACT
  Filled 2023-01-20 (×6): qty 2

## 2023-01-20 NOTE — H&P (Addendum)
History and Physical    Lisa [name] WUJ:[id number] DOB: 20-Oct-1934 DOA: 01/20/2023  PCP: [name], MD   Patient coming from: SNF   Chief Complaint: SOB   HPI: Lisa [name] is a pleasant 88 y.o. female with medical history significant for COPD, left lung mass status post radiation, MGUS, hypertension, CKD stage IV, and recent admissions with NSTEMI with patient refusing cardiac catheterization, now presenting with shortness of breath.  Patient reports that she developed shortness of breath and cough yesterday.  Shortness of breath became severe overnight and EMS was called.  She was found to be saturating 77% on room air.  She had also reported chest pain though she denies that now.  She was placed on supplemental oxygen, given 1 dose of nitroglycerin, and brought into the ED.   She reports improvement with breathing treatments.  ED Course: Upon arrival to the ED, patient is found to be afebrile and saturating mid 90s on 2 L/min of supplemental oxygen with mild tachypnea.  No acute findings noted on chest x-ray.  Labs are most notable for creatinine 2.19, hemoglobin 7.9, and troponin 71.  She was treated with IV Solu-Medrol and DuoNeb x 2 in the ED.  Review of Systems:  All other systems reviewed and apart from HPI, are negative.  Past Medical History:  Diagnosis Date   Anemia    Carotid artery stenosis    CKD (chronic kidney disease)    COPD (chronic obstructive pulmonary disease) (HCC)    Dyspnea    Due to COPD per patient   Goiter diffuse, nontoxic    Per patient   Hepatitis C    History of radiation therapy    Left lung-10/10/21-10/17/21- Dr. Antony Blackbird   Hypertension    Monoclonal gammopathy of unknown significance (MGUS)    Pyelonephritis 04/06/2014   UTI    Past Surgical History:  Procedure Laterality Date   ABDOMINAL HYSTERECTOMY     CHOLECYSTECTOMY N/A 09/17/2018   Procedure: LAPAROSCOPIC CHOLECYSTECTOMY;  Surgeon: Lucretia Roers, MD;   Location: [facility];  Service: General;  Laterality: N/A;   FUDUCIAL PLACEMENT Left 09/01/2021   Procedure: PLACEMENT OF FIDUCIAL MARKERS TIMES FOUR;  Surgeon: Loreli Slot, MD;  Location: [facility] OR;  Service: Thoracic;  Laterality: Left;   TEE WITHOUT CARDIOVERSION N/A 04/10/2014   Procedure: TRANSESOPHAGEAL ECHOCARDIOGRAM (TEE);  Surgeon: Donato Schultz, MD;  Location: [facility] ENDOSCOPY;  Service: Cardiovascular;  Laterality: N/A;   VIDEO BRONCHOSCOPY WITH ENDOBRONCHIAL NAVIGATION N/A 09/01/2021   Procedure: VIDEO BRONCHOSCOPY WITH ENDOBRONCHIAL NAVIGATION;  Surgeon: Loreli Slot, MD;  Location: [facility] OR;  Service: Thoracic;  Laterality: N/A;    Social History:   reports that she has quit smoking. Her smoking use included cigarettes. She has been exposed to tobacco smoke. She has never used smokeless tobacco. She reports that she does not drink alcohol and does not use drugs.  No Known Allergies  Family History  Problem Relation Age of Onset   Hypertension Mother    Thyroid disease Mother    Hypertension Father    Thyroid disease Sister    Thyroid disease Brother    Thyroid disease Sister      Prior to Admission medications   Medication Sig Start Date End Date Taking? Authorizing Provider  amLODipine (NORVASC) 10 MG tablet Take 1 tablet (10 mg total) by mouth daily. 01/10/23   Pokhrel, Rebekah Chesterfield, MD  aspirin 81 MG EC tablet Take 81 mg by mouth daily.  [provider]  Calcium Carbonate Antacid (TUMS PO) Take 1 tablet by mouth daily.    [provider]  carvedilol (COREG) 12.5 MG tablet Take 1 tablet (12.5 mg total) by mouth 2 (two) times daily with a meal. 01/10/23   Pokhrel, Laxman, MD  Cholecalciferol (VITAMIN D3) 125 MCG (5000 UT) CAPS Take 5,000 Units by mouth daily.    [provider]  clopidogrel (PLAVIX) 75 MG tablet Take 1 tablet (75 mg total) by mouth daily. 11/22/22   Johnson, Clanford L, MD  docusate sodium (COLACE) 100 MG capsule Take 1 capsule (100  mg total) by mouth 2 (two) times daily as needed for mild constipation. 01/10/23   Pokhrel, Rebekah Chesterfield, MD  feeding supplement (ENSURE ENLIVE / ENSURE PLUS) LIQD Take 237 mLs by mouth 2 (two) times daily between meals. 12/31/21   Sherryll Burger, Pratik D, DO  furosemide (LASIX) 20 MG tablet Take 1 tablet (20 mg total) by mouth daily. 12/08/22 03/08/23  Strader, Lennart Pall, PA-C  Ipratropium-Albuterol (COMBIVENT RESPIMAT) 20-100 MCG/ACT AERS respimat Inhale 1 puff into the lungs every 6 (six) hours as needed for wheezing or shortness of breath. 12/21/22   [name], MD  ipratropium-albuterol (DUONEB) 0.5-2.5 (3) MG/3ML SOLN Take 3 mLs by nebulization every 4 (four) hours as needed (wheezing/shortness of breath).    [provider]  Iron, Ferrous Sulfate, 325 (65 Fe) MG TABS Take 325 mg by mouth daily.    [provider]  isosorbide mononitrate (IMDUR) 60 MG 24 hr tablet Take 1.5 tablets (90 mg total) by mouth daily. 12/12/22 01/11/23  [name], MD  Multiple Vitamin (MULTIVITAMIN WITH MINERALS) TABS tablet Take 1 tablet by mouth daily. 11/22/22   Johnson, Clanford L, MD  nitroGLYCERIN (NITROSTAT) 0.4 MG SL tablet Place 1 tablet (0.4 mg total) under the tongue every 5 (five) minutes as needed for chest pain. 12/21/22 12/21/23  [name], MD  ranolazine (RANEXA) 500 MG 12 hr tablet Take 1 tablet (500 mg total) by mouth 2 (two) times daily. 12/19/22   Vassie Loll, MD  rosuvastatin (CRESTOR) 20 MG tablet Take 1 tablet (20 mg total) by mouth daily at 6 PM. 12/21/22   [name], MD    Physical Exam: Vitals:   01/20/23 0415 01/20/23 0431 01/20/23 0500 01/20/23 0515  BP: (!) 151/102  (!) 146/64 (!) 146/62  Pulse: 72  70 70  Resp: (!) 22  19 19   Temp:   (!) 96.8 F (36 C) (!) 97 F (36.1 C)  TempSrc:   Temporal Oral  SpO2: 100% 98% 100% 100%  Weight:      Height:        Constitutional: NAD, calm  Eyes: PERTLA, lids and conjunctivae normal ENMT: Mucous membranes are moist.  Posterior pharynx clear of any exudate or lesions.   Neck: supple, no masses  Respiratory: Diminished bilaterally with prolonged expiratory phase. No accessory muscle use.  Cardiovascular: S1 & S2 heard, regular rate and rhythm. No lower extremity edema.  Abdomen: No distension, no tenderness, soft. Bowel sounds active.  Musculoskeletal: no clubbing / cyanosis. No joint deformity upper and lower extremities.   Skin: no significant rashes, lesions, ulcers. Warm, dry, well-perfused. Neurologic: CN 2-12 grossly intact. Moving all extremities. Sleeping, wakes to voice and is oriented.  Psychiatric: Pleasant. Cooperative.    Labs and Imaging on Admission: I have personally reviewed following labs and imaging studies  CBC: Recent Labs  Lab 01/20/23 0054  WBC 5.8  NEUTROABS 4.6  HGB 7.9*  HCT 25.2*  MCV 95.8  PLT 125*   Basic Metabolic Panel: Recent Labs  Lab 01/20/23 0054  NA 136  K 4.7  CL 104  CO2 27  GLUCOSE 169*  BUN 48*  CREATININE 2.19*  CALCIUM 8.9   GFR: Estimated Creatinine Clearance: 13.7 mL/min (A) (by C-G formula based on SCr of 2.19 mg/dL (H)). Liver Function Tests: Recent Labs  Lab 01/20/23 0054  AST 37  ALT 30  ALKPHOS 42  BILITOT 0.5  PROT 8.6*  ALBUMIN 3.4*   No results for input(s): "LIPASE", "AMYLASE" in the last 168 hours. No results for input(s): "AMMONIA" in the last 168 hours. Coagulation Profile: No results for input(s): "INR", "PROTIME" in the last 168 hours. Cardiac Enzymes: No results for input(s): "CKTOTAL", "CKMB", "CKMBINDEX", "TROPONINI" in the last 168 hours. BNP (last 3 results) No results for input(s): "PROBNP" in the last 8760 hours. HbA1C: No results for input(s): "HGBA1C" in the last 72 hours. CBG: No results for input(s): "GLUCAP" in the last 168 hours. Lipid Profile: No results for input(s): "CHOL", "HDL", "LDLCALC", "TRIG", "CHOLHDL", "LDLDIRECT" in the last 72 hours. Thyroid Function Tests: No results for input(s):  "TSH", "T4TOTAL", "FREET4", "T3FREE", "THYROIDAB" in the last 72 hours. Anemia Panel: No results for input(s): "VITAMINB12", "FOLATE", "FERRITIN", "TIBC", "IRON", "RETICCTPCT" in the last 72 hours. Urine analysis:    Component Value Date/Time   COLORURINE YELLOW 02/07/2021 1550   APPEARANCEUR HAZY (A) 02/07/2021 1550   LABSPEC 1.018 02/07/2021 1550   PHURINE 5.0 02/07/2021 1550   GLUCOSEU NEGATIVE 02/07/2021 1550   HGBUR NEGATIVE 02/07/2021 1550   BILIRUBINUR NEGATIVE 02/07/2021 1550   KETONESUR 5 (A) 02/07/2021 1550   PROTEINUR 30 (A) 02/07/2021 1550   UROBILINOGEN 0.2 06/02/2014 1715   NITRITE NEGATIVE 02/07/2021 1550   LEUKOCYTESUR NEGATIVE 02/07/2021 1550   Sepsis Labs: @LABRCNTIP (procalcitonin:4,lacticidven:4) ) Recent Results (from the past 240 hour(s))  Resp panel by RT-PCR (RSV, Flu A&B, Covid) Anterior Nasal Swab     Status: None   Collection Time: 01/20/23  4:06 AM   Specimen: Anterior Nasal Swab  Result Value Ref Range Status   SARS Coronavirus 2 by RT PCR NEGATIVE NEGATIVE Final    Comment: (NOTE) SARS-CoV-2 target nucleic acids are NOT DETECTED.  The SARS-CoV-2 RNA is generally detectable in upper respiratory specimens during the acute phase of infection. The lowest concentration of SARS-CoV-2 viral copies this assay can detect is 138 copies/mL. A negative result does not preclude SARS-Cov-2 infection and should not be used as the sole basis for treatment or other patient management decisions. A negative result may occur with  improper specimen collection/handling, submission of specimen other than nasopharyngeal swab, presence of viral mutation(s) within the areas targeted by this assay, and inadequate number of viral copies(<138 copies/mL). A negative result must be combined with clinical observations, patient history, and epidemiological information. The expected result is Negative.  Fact Sheet for Patients:   BloggerCourse.com  Fact Sheet for Healthcare Providers:  SeriousBroker.it  This test is no t yet approved or cleared by the [place] FDA and  has been authorized for detection and/or diagnosis of SARS-CoV-2 by FDA under an Emergency Use Authorization (EUA). This EUA will remain  in effect (meaning this test can be used) for the duration of the COVID-19 declaration under Section 564(b)(1) of the Act, 21 U.S.C.section 360bbb-3(b)(1), unless the authorization is terminated  or revoked sooner.       Influenza A by PCR NEGATIVE NEGATIVE Final  Influenza B by PCR NEGATIVE NEGATIVE Final    Comment: (NOTE) The Xpert Xpress SARS-CoV-2/FLU/RSV plus assay is intended as an aid in the diagnosis of influenza from Nasopharyngeal swab specimens and should not be used as a sole basis for treatment. Nasal washings and aspirates are unacceptable for Xpert Xpress SARS-CoV-2/FLU/RSV testing.  Fact Sheet for Patients: BloggerCourse.com  Fact Sheet for Healthcare Providers: SeriousBroker.it  This test is not yet approved or cleared by the [place] FDA and has been authorized for detection and/or diagnosis of SARS-CoV-2 by FDA under an Emergency Use Authorization (EUA). This EUA will remain in effect (meaning this test can be used) for the duration of the COVID-19 declaration under Section 564(b)(1) of the Act, 21 U.S.C. section 360bbb-3(b)(1), unless the authorization is terminated or revoked.     Resp Syncytial Virus by PCR NEGATIVE NEGATIVE Final    Comment: (NOTE) Fact Sheet for Patients: BloggerCourse.com  Fact Sheet for Healthcare Providers: SeriousBroker.it  This test is not yet approved or cleared by the [place] FDA and has been authorized for detection and/or diagnosis of SARS-CoV-2 by FDA under an Emergency Use  Authorization (EUA). This EUA will remain in effect (meaning this test can be used) for the duration of the COVID-19 declaration under Section 564(b)(1) of the Act, 21 U.S.C. section 360bbb-3(b)(1), unless the authorization is terminated or revoked.  Performed at [facility], [address], [place], [place] [address]      Radiological Exams on Admission: DG Chest 2 View  Result Date: 01/20/2023 CLINICAL DATA:  Shortness of breath EXAM: CHEST - 2 VIEW COMPARISON:  12/31/2022 FINDINGS: Cardiac shadow is within normal limits. Fiducial markers are again noted in the left apex. Left upper lobe mass lesion is again noted similar to that seen on prior CT. No new focal infiltrate or sizable effusion is seen. No bony abnormality is noted. IMPRESSION: Stable appearing lesion in the left upper lobe with fiducial markers in place. Electronically Signed   By: Alcide Clever M.D.   On: 01/20/2023 01:57    EKG: Independently reviewed. Sinus rhythm, LVH with repolarization abnormality.   Assessment/Plan   1. Acute COPD exacerbation; acute hypoxic respiratory failure  - Culture sputum, start azithromycin, continue systemic steroid, schedule DuoNebs, use additional albuterol as needed, continue supplemental O2 as needed   2. Elevated troponin  - HS troponin went from 33 to 71 in ED; it was as high as 2000 last month and 12,000 in June   - She had reported chest pain at her facility, but currently denies any recent chest pain  - EKG appears similar to prior  - Trend troponin, continue ASA, Plavix, Crestor, and Coreg, consider IV heparin if there is a significant increase or she develops chest pain    3. Chronic HFpEF  - Appears compensated  - Continue Lasix    4. Hypertension  - Continue Norvasc and Coreg  5. CKD IV  - SCr is 2.19 on admission, similar to recent values  - Renally-dose medications    6. Thrombocytopenia  - Improved from recent admission    7. Anemia  - Appears stable, no overt  bleeding     DVT prophylaxis: sq heparin  Code Status: DNR  Level of Care: Level of care: Telemetry Family Communication: None present  Disposition Plan:  Patient is from: SNF  Anticipated d/c is to: SNF  Anticipated d/c date is: 01/22/23  Patient currently: Pending improved respiratory status  Consults called: None  Admission status: Inpatient  Briscoe Deutscher, MD [facility]  01/20/2023, 5:33 AM

## 2023-01-20 NOTE — Progress Notes (Signed)
   01/20/23 1408  TOC Brief Assessment  Insurance and Status Reviewed  Patient has primary care physician Yes  Home environment has been reviewed From SNF [facility]  Prior level of function: Need Assistance  Social Determinants of Health Reivew SDOH reviewed no interventions necessary  Readmission risk has been reviewed Yes (Red, critically ill.)  Transition of care needs no transition of care needs at this time   PT from [facility] SNF. Critically ill. Pt is DNR. Recently using Bipap. TOC to follow should planning needs arise.

## 2023-01-20 NOTE — ED Provider Notes (Signed)
[place] EMERGENCY DEPARTMENT AT [facility] Provider Note   CSN: [id number] Arrival date & time: 01/20/23  0030     History {Add pertinent medical, surgical, social history, OB history to HPI:1} Chief Complaint  Patient presents with   Shortness of Breath    Lisa [name] is a 88 y.o. female.  88 year old female with a history of cardiac disease, COPD who presents to the ER today secondary to shortness of breath.  Patient lives in a facility and states that she had acute onset of dyspnea with mild chest pain earlier tonight.  They checked her oxygen and gave her nitroglycerin.  She states that nitroglycerin seemed to make her shortness of breath better.  No recent fever or cough.   Shortness of Breath      Home Medications Prior to Admission medications   Medication Sig Start Date End Date Taking? Authorizing Provider  amLODipine (NORVASC) 10 MG tablet Take 1 tablet (10 mg total) by mouth daily. 01/10/23   Pokhrel, Rebekah Chesterfield, MD  aspirin 81 MG EC tablet Take 81 mg by mouth daily.    [provider]  Calcium Carbonate Antacid (TUMS PO) Take 1 tablet by mouth daily.    [provider]  carvedilol (COREG) 12.5 MG tablet Take 1 tablet (12.5 mg total) by mouth 2 (two) times daily with a meal. 01/10/23   Pokhrel, Laxman, MD  Cholecalciferol (VITAMIN D3) 125 MCG (5000 UT) CAPS Take 5,000 Units by mouth daily.    [provider]  clopidogrel (PLAVIX) 75 MG tablet Take 1 tablet (75 mg total) by mouth daily. 11/22/22   Johnson, Clanford L, MD  docusate sodium (COLACE) 100 MG capsule Take 1 capsule (100 mg total) by mouth 2 (two) times daily as needed for mild constipation. 01/10/23   Pokhrel, Rebekah Chesterfield, MD  feeding supplement (ENSURE ENLIVE / ENSURE PLUS) LIQD Take 237 mLs by mouth 2 (two) times daily between meals. 12/31/21   Sherryll Burger, Pratik D, DO  furosemide (LASIX) 20 MG tablet Take 1 tablet (20 mg total) by mouth daily. 12/08/22 03/08/23  Strader, Lennart Pall, PA-C  Ipratropium-Albuterol (COMBIVENT RESPIMAT) 20-100 MCG/ACT AERS respimat Inhale 1 puff into the lungs every 6 (six) hours as needed for wheezing or shortness of breath. 12/21/22   [name], MD  ipratropium-albuterol (DUONEB) 0.5-2.5 (3) MG/3ML SOLN Take 3 mLs by nebulization every 4 (four) hours as needed (wheezing/shortness of breath).    [provider]  Iron, Ferrous Sulfate, 325 (65 Fe) MG TABS Take 325 mg by mouth daily.    [provider]  isosorbide mononitrate (IMDUR) 60 MG 24 hr tablet Take 1.5 tablets (90 mg total) by mouth daily. 12/12/22 01/11/23  [name], MD  Multiple Vitamin (MULTIVITAMIN WITH MINERALS) TABS tablet Take 1 tablet by mouth daily. 11/22/22   Johnson, Clanford L, MD  nitroGLYCERIN (NITROSTAT) 0.4 MG SL tablet Place 1 tablet (0.4 mg total) under the tongue every 5 (five) minutes as needed for chest pain. 12/21/22 12/21/23  [name], MD  ranolazine (RANEXA) 500 MG 12 hr tablet Take 1 tablet (500 mg total) by mouth 2 (two) times daily. 12/19/22   Vassie Loll, MD  rosuvastatin (CRESTOR) 20 MG tablet Take 1 tablet (20 mg total) by mouth daily at 6 PM. 12/21/22   [name], MD      Allergies    Patient has no known allergies.    Review of Systems   Review of Systems  Respiratory:  Positive for shortness of breath.     Physical Exam Updated Vital Signs BP (!) 148/78 (BP Location: Right Arm)   Pulse 72   Temp (!) 96.1 F (35.6 C) (Temporal)   Resp 20   Ht 5\' 7"  (1.702 m)   Wt 47.2 kg   SpO2 97%   BMI 16.29 kg/m  Physical Exam Vitals and nursing note reviewed.  Constitutional:      Appearance: She is well-developed.  HENT:     Head: Normocephalic and atraumatic.  Cardiovascular:     Rate and Rhythm: Normal rate and regular rhythm.  Pulmonary:     Effort: No respiratory distress.     Breath sounds: No stridor. Decreased breath sounds present.     Comments: Has some upper airway sounds that make expiratory  sounds difficult to auscultate Abdominal:     General: There is no distension.  Musculoskeletal:     Cervical back: Normal range of motion.  Neurological:     Mental Status: She is alert.     ED Results / Procedures / Treatments   Labs (all labs ordered are listed, but only abnormal results are displayed) Labs Reviewed  CBC WITH DIFFERENTIAL/PLATELET - Abnormal; Notable for the following components:      Result Value   RBC 2.63 (*)    Hemoglobin 7.9 (*)    HCT 25.2 (*)    RDW 18.5 (*)    Platelets 125 (*)    All other components within normal limits  COMPREHENSIVE METABOLIC PANEL - Abnormal; Notable for the following components:   Glucose, Bld 169 (*)    BUN 48 (*)    Creatinine, Ser 2.19 (*)    Total Protein 8.6 (*)    Albumin 3.4 (*)    GFR, Estimated 21 (*)    All other components within normal limits  TROPONIN I (HIGH SENSITIVITY) - Abnormal; Notable for the following components:   Troponin I (High Sensitivity) 33 (*)    All other components within normal limits    EKG EKG Interpretation Date/Time:  Saturday January 20 2023 00:43:27 EDT Ventricular Rate:  73 PR Interval:  143 QRS Duration:  100 QT Interval:  431 QTC Calculation: 475 R Axis:   75  Text Interpretation: Sinus rhythm Probable LVH with secondary repol abnrm Artifact in lead(s) I II III aVR aVL aVF V1 V4 difficult to tell with artifact but looks mostly similar to july 23 with lead placement considerations Confirmed by Marily Memos [phone]) on 01/20/2023 1:22:38 AM  Radiology DG Chest 2 View  Result Date: 01/20/2023 CLINICAL DATA:  Shortness of breath EXAM: CHEST - 2 VIEW COMPARISON:  12/31/2022 FINDINGS: Cardiac shadow is within normal limits. Fiducial markers are again noted in the left apex. Left upper lobe mass lesion is again noted similar to that seen on prior CT. No new focal infiltrate or sizable effusion is seen. No bony abnormality is noted. IMPRESSION: Stable appearing lesion in the left upper  lobe with fiducial markers in place. Electronically Signed   By: Alcide Clever M.D.   On: 01/20/2023 01:57    Procedures Procedures  {Document cardiac monitor, telemetry assessment procedure when appropriate:1}  Medications Ordered in ED Medications  ipratropium-albuterol (DUONEB) 0.5-2.5 (3) MG/3ML nebulizer solution 3 mL (has no administration in time range)    ED Course/ Medical Decision Making/ A&P   {   Click here for ABCD2, HEART and other calculatorsREFRESH Note before signing :1}  Medical Decision Making Amount and/or Complexity of Data Reviewed Labs: ordered. Radiology: ordered.  Risk Prescription drug management.  88 year old female here with acute dyspnea.  She is 100% on her 2 L so we will shut it off to see if she is truly hypoxic still.  Consider possible COPD exacerbation, NSTEMI, pneumonia.  Will evaluate for same. ***  {Document critical care time when appropriate:1} {Document review of labs and clinical decision tools ie heart score, Chads2Vasc2 etc:1}  {Document your independent review of radiology images, and any outside records:1} {Document your discussion with family members, caretakers, and with consultants:1} {Document social determinants of health affecting pt's care:1} {Document your decision making why or why not admission, treatments were needed:1} Final Clinical Impression(s) / ED Diagnoses Final diagnoses:  None    Rx / DC Orders ED Discharge Orders     None

## 2023-01-20 NOTE — ED Notes (Signed)
Date and time results received: 01/20/23 0426   Test: TROPONIN Critical Value: 89  Name of Provider Notified: Marily Memos, MD

## 2023-01-20 NOTE — Progress Notes (Signed)
Responded to nursing call:  continued sob and family present   Subjective: Pt continues to have sob about same.  Denies cp, nv  Vitals:   01/20/23 0500 01/20/23 0505 01/20/23 0515 01/20/23 0801  BP: (!) 146/64  (!) 146/62 (!) 152/62  Pulse: 70  70 76  Resp: 19  19 18   Temp: (!) 96.8 F (36 C)  (!) 97 F (36.1 C)   TempSrc: Temporal  Oral   SpO2: 100% 100% 100% 97%  Weight:      Height:       CV--RRR Lung--bilateral crackles.  Bilateral wheeze Abd--soft+BS/NT   Assessment/Plan: Respiratory distress -likely multifactorial--COPD ex, PNA, fluid overload -lasix IV x 1 -start zosyn -BNP 1079 -already on nebs and steroids -ABG--7.16/74/55/26 on 2L -move to SDU and place of BiPAP -reviewed GOC documents>>DNR noted -niece updated  The patient is critically ill with multiple organ systems failure and requires high complexity decision making for assessment and support, frequent evaluation and titration of therapies, application of advanced monitoring technologies and extensive interpretation of multiple databases.  Critical care time - 35 mins. In addition to the 40 minutes spent earlier to day      Catarina Hartshorn, DO [facility]

## 2023-01-20 NOTE — Progress Notes (Signed)
Patient arrived to the floor leaning over bed rails claiming she could not breathe. She stated her heart was racing. Her oxygen saturation was 100% on 2L and her her heart rate remained less than 100 and normal sinus. She wont lay back for a complete assessment. Another nurse stated that she was kicking and swinging arms when I walked out the room . I expressed to the patient that it is important for her to relax if she feels she is having trouble breathing and to not exert so much energy.

## 2023-01-20 NOTE — ED Notes (Signed)
ED Provider at bedside. 

## 2023-01-20 NOTE — ED Notes (Signed)
Kristen from [facility] updated on Pts admission status.

## 2023-01-20 NOTE — Progress Notes (Signed)
PROGRESS NOTE  Cherilyn Plattner XBJ:[id number] DOB: 1935-03-07 DOA: 01/20/2023 PCP: [name], MD  Brief History:   88 y.o. female with medical history significant for COPD, left lung mass status post radiation, MGUS, hypertension, CKD stage IV, and recent admissions with NSTEMI with patient refusing cardiac catheterization, now presenting from SNF with sob x 1 day. Patient reports that she developed shortness of breath and cough yesterday. Shortness of breath became severe overnight and EMS was called. She was found to be saturating 77% on room air. She had also reported chest pain though she denies that now.   ED Course: Upon arrival to the ED, patient is found to be afebrile and saturating mid 90s on 2 L/min of supplemental oxygen with mild tachypnea.  No acute findings noted on chest x-ray.  Labs are most notable for creatinine 2.19, hemoglobin 7.9, and troponin 71.   In the morning of 01/20/2023, the patient had some respiratory distress.  Her bronchodilators were adjusted.  Chest x-ray and ABG were ordered.   Assessment/Plan: Acute respiratory failure with hypoxia -Secondary to COPD exacerbation -Currently on 2 L -Check ABG -Continue Solu-Medrol -Add Brovana and Pulmicort -Continue DuoNebs -Repeat chest x-ray  COPD exacerbation -Workup as above -Viral respiratory panel -COVD--neg  CKD stage IV -Baseline creatinine 1.8-2.0 -Monitor BMP  Elevated troponin -Secondary to demand ischemia -Currently without any chest pain -Patient has had history of NSTEMI with troponins up to 2115 -She has refused heart catheterization on numerous occasions -11/16/2022 echo EF 55-60%, grade 2 DD, normal RVEF, mild MR -Continue aspirin, Plavix, statin, carvedilol, Ranexa, Imdur  Chronic HFpEF -Appears clinically euvolemic -11/16/2022 echo EF 55-60%, grade 2 DD, normal RVEF, mild MR -Continue home dose furosemide  Essential hypertension -Continue  carvedilol           Family Communication:  no Family at bedside  Consultants:  none  Code Status:  DNR  DVT Prophylaxis:  Daniels Heparin   Procedures: As Listed in Progress Note Above  Antibiotics: None     Subjective: Patient complains of cough and shortness of breath.  She denies any chest pain or abdominal pain.  There is no vomiting or diarrhea.  Objective: Vitals:   01/20/23 0500 01/20/23 0505 01/20/23 0515 01/20/23 0801  BP: (!) 146/64  (!) 146/62 (!) 152/62  Pulse: 70  70 76  Resp: 19  19 18   Temp: (!) 96.8 F (36 C)  (!) 97 F (36.1 C)   TempSrc: Temporal  Oral   SpO2: 100% 100% 100% 97%  Weight:      Height:       No intake or output data in the 24 hours ending 01/20/23 0808 Weight change:  Exam:  General:  Pt is alert, follows commands appropriately, HEENT: No icterus, No thrush, No neck mass, Bertie/AT Cardiovascular: RRR, S1/S2, no rubs, no gallops Respiratory: Bilateral expiratory wheeze.  Diminished breath sounds. Abdomen: Soft/+BS, non tender, non distended, no guarding Extremities: trace LE edema, No lymphangitis, No petechiae, No rashes, no synovitis   Data Reviewed: I have personally reviewed following labs and imaging studies Basic Metabolic Panel: Recent Labs  Lab 01/20/23 0054  NA 136  K 4.7  CL 104  CO2 27  GLUCOSE 169*  BUN 48*  CREATININE 2.19*  CALCIUM 8.9   Liver Function Tests: Recent Labs  Lab 01/20/23 0054  AST 37  ALT 30  ALKPHOS 42  BILITOT 0.5  PROT 8.6*  ALBUMIN  3.4*   No results for input(s): "LIPASE", "AMYLASE" in the last 168 hours. No results for input(s): "AMMONIA" in the last 168 hours. Coagulation Profile: No results for input(s): "INR", "PROTIME" in the last 168 hours. CBC: Recent Labs  Lab 01/20/23 0054  WBC 5.8  NEUTROABS 4.6  HGB 7.9*  HCT 25.2*  MCV 95.8  PLT 125*   Cardiac Enzymes: No results for input(s): "CKTOTAL", "CKMB", "CKMBINDEX", "TROPONINI" in the last 168  hours. BNP: Invalid input(s): "POCBNP" CBG: Recent Labs  Lab 01/20/23 0756  GLUCAP 125*   HbA1C: No results for input(s): "HGBA1C" in the last 72 hours. Urine analysis:    Component Value Date/Time   COLORURINE YELLOW 02/07/2021 1550   APPEARANCEUR HAZY (A) 02/07/2021 1550   LABSPEC 1.018 02/07/2021 1550   PHURINE 5.0 02/07/2021 1550   GLUCOSEU NEGATIVE 02/07/2021 1550   HGBUR NEGATIVE 02/07/2021 1550   BILIRUBINUR NEGATIVE 02/07/2021 1550   KETONESUR 5 (A) 02/07/2021 1550   PROTEINUR 30 (A) 02/07/2021 1550   UROBILINOGEN 0.2 06/02/2014 1715   NITRITE NEGATIVE 02/07/2021 1550   LEUKOCYTESUR NEGATIVE 02/07/2021 1550   Sepsis Labs: @LABRCNTIP (procalcitonin:4,lacticidven:4) ) Recent Results (from the past 240 hour(s))  Resp panel by RT-PCR (RSV, Flu A&B, Covid) Anterior Nasal Swab     Status: None   Collection Time: 01/20/23  4:06 AM   Specimen: Anterior Nasal Swab  Result Value Ref Range Status   SARS Coronavirus 2 by RT PCR NEGATIVE NEGATIVE Final    Comment: (NOTE) SARS-CoV-2 target nucleic acids are NOT DETECTED.  The SARS-CoV-2 RNA is generally detectable in upper respiratory specimens during the acute phase of infection. The lowest concentration of SARS-CoV-2 viral copies this assay can detect is 138 copies/mL. A negative result does not preclude SARS-Cov-2 infection and should not be used as the sole basis for treatment or other patient management decisions. A negative result may occur with  improper specimen collection/handling, submission of specimen other than nasopharyngeal swab, presence of viral mutation(s) within the areas targeted by this assay, and inadequate number of viral copies(<138 copies/mL). A negative result must be combined with clinical observations, patient history, and epidemiological information. The expected result is Negative.  Fact Sheet for Patients:  BloggerCourse.com  Fact Sheet for Healthcare Providers:   SeriousBroker.it  This test is no t yet approved or cleared by the [place] FDA and  has been authorized for detection and/or diagnosis of SARS-CoV-2 by FDA under an Emergency Use Authorization (EUA). This EUA will remain  in effect (meaning this test can be used) for the duration of the COVID-19 declaration under Section 564(b)(1) of the Act, 21 U.S.C.section 360bbb-3(b)(1), unless the authorization is terminated  or revoked sooner.       Influenza A by PCR NEGATIVE NEGATIVE Final   Influenza B by PCR NEGATIVE NEGATIVE Final    Comment: (NOTE) The Xpert Xpress SARS-CoV-2/FLU/RSV plus assay is intended as an aid in the diagnosis of influenza from Nasopharyngeal swab specimens and should not be used as a sole basis for treatment. Nasal washings and aspirates are unacceptable for Xpert Xpress SARS-CoV-2/FLU/RSV testing.  Fact Sheet for Patients: BloggerCourse.com  Fact Sheet for Healthcare Providers: SeriousBroker.it  This test is not yet approved or cleared by the [place] FDA and has been authorized for detection and/or diagnosis of SARS-CoV-2 by FDA under an Emergency Use Authorization (EUA). This EUA will remain in effect (meaning this test can be used) for the duration of the COVID-19 declaration under Section 564(b)(1) of the Act,  21 U.S.C. section 360bbb-3(b)(1), unless the authorization is terminated or revoked.     Resp Syncytial Virus by PCR NEGATIVE NEGATIVE Final    Comment: (NOTE) Fact Sheet for Patients: BloggerCourse.com  Fact Sheet for Healthcare Providers: SeriousBroker.it  This test is not yet approved or cleared by the [place] FDA and has been authorized for detection and/or diagnosis of SARS-CoV-2 by FDA under an Emergency Use Authorization (EUA). This EUA will remain in effect (meaning this test can be used) for  the duration of the COVID-19 declaration under Section 564(b)(1) of the Act, 21 U.S.C. section 360bbb-3(b)(1), unless the authorization is terminated or revoked.  Performed at [facility], [address] [place], [place] [address]      Scheduled Meds:  amLODipine  10 mg Oral Daily   arformoterol  15 mcg Nebulization BID   aspirin EC  81 mg Oral Daily   [START ON 01/21/2023] azithromycin  500 mg Oral Daily   budesonide (PULMICORT) nebulizer solution  0.5 mg Nebulization BID   carvedilol  12.5 mg Oral BID WC   clopidogrel  75 mg Oral Daily   furosemide  20 mg Oral Daily   heparin  5,000 Units Subcutaneous Q8H   ipratropium-albuterol  3 mL Nebulization Q6H   isosorbide mononitrate  90 mg Oral Daily   methylPREDNISolone (SOLU-MEDROL) injection  60 mg Intravenous Q12H   ranolazine  500 mg Oral BID   rosuvastatin  10 mg Oral q1800   sodium chloride flush  3 mL Intravenous Q12H   Continuous Infusions:  azithromycin      Procedures/Studies: DG Chest 2 View  Result Date: 01/20/2023 CLINICAL DATA:  Shortness of breath EXAM: CHEST - 2 VIEW COMPARISON:  12/31/2022 FINDINGS: Cardiac shadow is within normal limits. Fiducial markers are again noted in the left apex. Left upper lobe mass lesion is again noted similar to that seen on prior CT. No new focal infiltrate or sizable effusion is seen. No bony abnormality is noted. IMPRESSION: Stable appearing lesion in the left upper lobe with fiducial markers in place. Electronically Signed   By: Alcide Clever M.D.   On: 01/20/2023 01:57   DG Chest Portable 1 View  Result Date: 12/31/2022 CLINICAL DATA:  Shortness of breath and chest pain, initial encounter EXAM: PORTABLE CHEST 1 VIEW COMPARISON:  12/16/2022 FINDINGS: Cardiac shadow is stable. Persistent left apical mass is noted with fiducial markers in place. Lungs are well aerated. Persistent upper lobe opacities are seen which correspond to ground-glass opacities on recent CT examination. No new  focal abnormality is noted. IMPRESSION: Overall stable appearance of the chest when compared with the prior exam. Electronically Signed   By: Alcide Clever M.D.   On: 12/31/2022 20:33    Catarina Hartshorn, DO  [facility]  If 7PM-7AM, please contact night-coverage [email or web address] Password [email or web address] 01/20/2023, 8:08 AM   LOS: 0 days

## 2023-01-20 NOTE — Hospital Course (Signed)
88 y.o. female with medical history significant for COPD, left lung mass status post radiation, MGUS, hypertension, CKD stage IV, and recent admissions with NSTEMI with patient refusing cardiac catheterization, now presenting from SNF with sob x 1 day. Patient reports that she developed shortness of breath and cough yesterday. Shortness of breath became severe overnight and EMS was called. She was found to be saturating 77% on room air. She had also reported chest pain though she denies that now.   ED Course: Upon arrival to the ED, patient is found to be afebrile and saturating mid 90s on 2 L/min of supplemental oxygen with mild tachypnea.  No acute findings noted on chest x-ray.  Labs are most notable for creatinine 2.19, hemoglobin 7.9, and troponin 71.   In the morning of 01/20/2023, the patient had some respiratory distress.  Pulmicort and Brovana were added.  She was continued on DuoNebs and Solu-Medrol.  ABG showed respiratory acidosis and hypercarbia.  The patient was transferred to the stepdown unit and placed on BiPAP.  She remained on BiPAP with brief periods of breaks in between.  Respiratory status and mental status gradually improved; however, she continued to require intermittent BiPAP.

## 2023-01-21 ENCOUNTER — Inpatient Hospital Stay (HOSPITAL_COMMUNITY): Payer: Medicare Other

## 2023-01-21 DIAGNOSIS — D696 Thrombocytopenia, unspecified: Secondary | ICD-10-CM | POA: Diagnosis not present

## 2023-01-21 DIAGNOSIS — J9601 Acute respiratory failure with hypoxia: Secondary | ICD-10-CM | POA: Diagnosis not present

## 2023-01-21 DIAGNOSIS — J441 Chronic obstructive pulmonary disease with (acute) exacerbation: Secondary | ICD-10-CM | POA: Diagnosis not present

## 2023-01-21 DIAGNOSIS — N184 Chronic kidney disease, stage 4 (severe): Secondary | ICD-10-CM | POA: Diagnosis not present

## 2023-01-21 LAB — TYPE AND SCREEN
ABO/RH(D): O POS
Antibody Screen: NEGATIVE
Unit division: 0

## 2023-01-21 LAB — FOLATE: Folate: 24.3 ng/mL (ref >5.9)

## 2023-01-21 LAB — CBC
HCT: 21.4 % — ABNORMAL LOW (ref 36.0–46.0)
HCT: 20.6 % — ABNORMAL LOW (ref 36.0–46.0)
HCT: 25 % — ABNORMAL LOW (ref 36.0–46.0)
Hemoglobin: 6.9 g/dL — CL (ref 12.0–15.0)
Hemoglobin: 6.5 g/dL — CL (ref 12.0–15.0)
Hemoglobin: 8.5 g/dL — ABNORMAL LOW (ref 12.0–15.0)
MCH: 30.7 pg (ref 26.0–34.0)
MCH: 30 pg (ref 26.0–34.0)
MCH: 31.4 pg (ref 26.0–34.0)
MCHC: 32.2 g/dL (ref 30.0–36.0)
MCHC: 31.6 g/dL (ref 30.0–36.0)
MCHC: 34 g/dL (ref 30.0–36.0)
MCV: 95.1 fL (ref 80.0–100.0)
MCV: 94.9 fL (ref 80.0–100.0)
MCV: 92.3 fL (ref 80.0–100.0)
Platelets: 94 10*3/uL — ABNORMAL LOW (ref 150–400)
Platelets: 101 10*3/uL — ABNORMAL LOW (ref 150–400)
Platelets: 93 10*3/uL — ABNORMAL LOW (ref 150–400)
RBC: 2.25 MIL/uL — ABNORMAL LOW (ref 3.87–5.11)
RBC: 2.17 MIL/uL — ABNORMAL LOW (ref 3.87–5.11)
RBC: 2.71 MIL/uL — ABNORMAL LOW (ref 3.87–5.11)
RDW: 18 % — ABNORMAL HIGH (ref 11.5–15.5)
RDW: 18 % — ABNORMAL HIGH (ref 11.5–15.5)
RDW: 16.8 % — ABNORMAL HIGH (ref 11.5–15.5)
WBC: 4.4 10*3/uL (ref 4.0–10.5)
WBC: 4.4 10*3/uL (ref 4.0–10.5)
WBC: 5.8 10*3/uL (ref 4.0–10.5)
nRBC: 0.5 % — ABNORMAL HIGH (ref 0.0–0.2)
nRBC: 0 % (ref 0.0–0.2)
nRBC: 0 % (ref 0.0–0.2)

## 2023-01-21 LAB — BPAM RBC
Blood Product Expiration Date: 202409102359
ISSUE DATE / TIME: 202408110908
Unit Type and Rh: 5100

## 2023-01-21 LAB — BASIC METABOLIC PANEL WITH GFR
Anion gap: 6 (ref 5–15)
BUN: 66 mg/dL — ABNORMAL HIGH (ref 8–23)
CO2: 25 mmol/L (ref 22–32)
Calcium: 8.2 mg/dL — ABNORMAL LOW (ref 8.9–10.3)
Chloride: 106 mmol/L (ref 98–111)
Creatinine, Ser: 2.6 mg/dL — ABNORMAL HIGH (ref 0.44–1.00)
GFR, Estimated: 17 mL/min — ABNORMAL LOW (ref >60)
Glucose, Bld: 123 mg/dL — ABNORMAL HIGH (ref 70–99)
Potassium: 4.4 mmol/L (ref 3.5–5.1)
Sodium: 137 mmol/L (ref 135–145)

## 2023-01-21 LAB — FERRITIN: Ferritin: 606 ng/mL — ABNORMAL HIGH (ref 11–307)

## 2023-01-21 LAB — BLOOD GAS, ARTERIAL
Acid-base deficit: 0.8 mmol/L (ref 0.0–2.0)
Bicarbonate: 24.9 mmol/L (ref 20.0–28.0)
Drawn by: 35043
O2 Saturation: 99.9 %
Patient temperature: 36.8
pCO2 arterial: 44 mmHg (ref 32–48)
pH, Arterial: 7.36 (ref 7.35–7.45)
pO2, Arterial: 147 mmHg — ABNORMAL HIGH (ref 83–108)

## 2023-01-21 LAB — PREPARE RBC (CROSSMATCH)

## 2023-01-21 LAB — IRON AND TIBC
Iron: 41 ug/dL (ref 28–170)
Saturation Ratios: 14 % (ref 10.4–31.8)
TIBC: 289 ug/dL (ref 250–450)
UIBC: 248 ug/dL

## 2023-01-21 LAB — MAGNESIUM: Magnesium: 2.1 mg/dL (ref 1.7–2.4)

## 2023-01-21 LAB — BRAIN NATRIURETIC PEPTIDE: B Natriuretic Peptide: 1237 pg/mL — ABNORMAL HIGH (ref 0.0–100.0)

## 2023-01-21 MED ORDER — SODIUM CHLORIDE 0.9% IV SOLUTION: Freq: Once | INTRAVENOUS | Status: AC

## 2023-01-21 MED ORDER — PIPERACILLIN-TAZOBACTAM IN DEX 2-0.25 GM/50ML IV SOLN
2.2500 g | Freq: Three times a day (TID) | INTRAVENOUS | Status: DC
  Administered 2023-01-21 – 2023-01-22 (×3): 2.25 g via INTRAVENOUS
  Filled 2023-01-21: qty 2.25
  Filled 2023-01-21 (×3): qty 50
  Filled 2023-01-21: qty 2.25
  Filled 2023-01-21: qty 50
  Filled 2023-01-21: qty 2.25

## 2023-01-21 MED ORDER — SODIUM CHLORIDE 0.9 % IV SOLN
500.0000 mg | INTRAVENOUS | Status: AC
  Administered 2023-01-21 – 2023-01-22 (×2): 500 mg via INTRAVENOUS
  Filled 2023-01-21 (×2): qty 5

## 2023-01-21 NOTE — Progress Notes (Signed)
Collected ABG this morning.  Reading was much better  Latest Reference Range & Units 01/21/23 05:39  pH, Arterial 7.35 - 7.45  7.36  pCO2 arterial 32 - 48 mmHg 44  pO2, Arterial 83 - 108 mmHg 147 (H)  Acid-base deficit 0.0 - 2.0 mmol/L 0.8  Bicarbonate 20.0 - 28.0 mmol/L 24.9  (H): Data is abnormally high  Took patient off Bipap and placed on 2L [place].  Sat currently at 99%.  Will continue to monitor and made RN aware.

## 2023-01-21 NOTE — Progress Notes (Signed)
Responded to nursing call:  sob I evaluated pt at 1730 and at 1700 -pt was comfortable on RA initially.  She stated she dropped her cell phone and exerted herself to get OOB to get the phone.  She developed more sob.   Placed on BiPAP again--pt now states she is breathing comfortably on Bipap. Denies cp, n/v/d, abd pain     Vitals:   01/21/23 1500 01/21/23 1530 01/21/23 1600 01/21/23 1700  BP:  (!) 130/51 (!) 157/62 (!) 146/69  Pulse: 68 68 65 92  Resp: 17 20 16  (!) 23  Temp:      TempSrc:      SpO2: 100% 100% 99% 100%  Weight:      Height:       CV--RRR Lung--bibasilar rales.  Diminished BS Abd--soft+BS/NT -Ext--no edema   Assessment/Plan: COPD Exac -will continued to need intermittent BiPAP if SOB -can take off BiPAP for meals and prn and use it prn -continue zosyn -personally reviewed CXR--chronic interstitial markings, LUL opacity -do not feel she is clinically fluid overloaded     Catarina Hartshorn, DO [facility]

## 2023-01-21 NOTE — Progress Notes (Signed)
CRITICAL VALUE STICKER  CRITICAL VALUE: Hemoglobin 6.2  RECEIVER (on-site recipient of call): Selena Batten, RN  MD NOTIFIED: Dr. Karie Schwalbe Opyd  TIME OF NOTIFICATION: 1610  RESPONSE:  awaiting for orders

## 2023-01-21 NOTE — Progress Notes (Signed)
Hgb is 6.9 this am, down from 7.9 yesterday. No overt bleeding.    Plan to transfuse 1 unit RBC.

## 2023-01-21 NOTE — Progress Notes (Signed)
PROGRESS NOTE  Lisa [name] GNF:[id number] DOB: May 27, 1935 DOA: 01/20/2023 PCP: [name], MD  Brief History:   88 y.o. female with medical history significant for COPD, left lung mass status post radiation, MGUS, hypertension, CKD stage IV, and recent admissions with NSTEMI with patient refusing cardiac catheterization, now presenting from SNF with sob x 1 day. Patient reports that she developed shortness of breath and cough yesterday. Shortness of breath became severe overnight and EMS was called. She was found to be saturating 77% on room air. She had also reported chest pain though she denies that now.   ED Course: Upon arrival to the ED, patient is found to be afebrile and saturating mid 90s on 2 L/min of supplemental oxygen with mild tachypnea.  No acute findings noted on chest x-ray.  Labs are most notable for creatinine 2.19, hemoglobin 7.9, and troponin 71.   In the morning of 01/20/2023, the patient had some respiratory distress.  Pulmicort and Brovana were added.  She was continued on DuoNebs and Solu-Medrol.  ABG showed respiratory acidosis and hypercarbia.  The patient was transferred to the stepdown unit and placed on BiPAP.  She remained on BiPAP with brief periods of breaks in between.  Respiratory status and mental status gradually improved; however, she continued to require intermittent BiPAP.   Assessment/Plan:  Acute respiratory failure with hypoxia -Secondary to COPD exacerbation -placed on BiPAP 8/10>>wean as tolerated -8/10 ABG 7.16/44/147/24 -8/11 ABG 7.36/44/147/24 -Continue Solu-Medrol -Add Brovana and Pulmicort -Continue DuoNebs -Repeat chest x-ray personally reviewed--increased interstitial marking;  no consolidation   COPD exacerbation -Workup as above -Viral respiratory panel--neg -COVD--neg   Acute on chronic CKD stage IV -Baseline creatinine 1.8-2.0 -due to diuresis -Monitor BMP   Elevated troponin -Secondary to demand  ischemia -Currently without any chest pain -Patient has had history of NSTEMI with troponins up to 2115 -She has refused heart catheterization on numerous occasions -11/16/2022 echo EF 55-60%, grade 2 DD, normal RVEF, mild MR -Continue aspirin, Plavix, statin, carvedilol, Ranexa, Imdur   Chronic HFpEF -Appears clinically euvolemic -11/16/2022 echo EF 55-60%, grade 2 DD, normal RVEF, mild MR -received IV lasix x 1 8/10   Essential hypertension -Continue carvedilol and amlodipine  Anemia of CKD -Hgb down to 6.9 -transfuse one unit PRBC -no signs of active bleed  Thrombocytopenia -appear chronic, intermittent since 09/2018 -B12 -folate -TSH  GOC -extensive discussions with family -discussed conflicting info and goals on MOST form -family confirms no CPR, no intubation                     Family Communication:  Niece and brother updated   Consultants:  none   Code Status:  DNR   DVT Prophylaxis:  Ware Heparin     Procedures: As Listed in Progress Note Above   Antibiotics: Azithro 8/10>> Zosyn 8/10>>     Subjective: Patient is breathing better, but still has some sob.  Denies f/c, cp, n/v/d, abd pain.    Objective: Vitals:   01/21/23 0609 01/21/23 0630 01/21/23 0700 01/21/23 0719  BP:  (!) 144/62 136/69   Pulse:  82 78   Resp:  (!) 24 18   Temp:      TempSrc:      SpO2: 99% 100% 99% 99%  Weight:      Height:        Intake/Output Summary (Last 24 hours) at 01/21/2023 0720 Last data filed at 01/21/2023  0550 Gross per 24 hour  Intake 359.59 ml  Output 150 ml  Net 209.59 ml   Weight change: 0.726 kg Exam:  General:  Pt is alert, follows commands appropriately, not in acute distress HEENT: No icterus, No thrush, No neck mass, [place]/AT Cardiovascular: RRR, S1/S2, no rubs, no gallops Respiratory: bibasilar rales.  Diminished BS.  Bibasilar wheeze Abdomen: Soft/+BS, non tender, non distended, no guarding Extremities: No edema, No lymphangitis, No  petechiae, No rashes, no synovitis   Data Reviewed: I have personally reviewed following labs and imaging studies Basic Metabolic Panel: Recent Labs  Lab 01/20/23 0054 01/20/23 0807 01/21/23 0431  NA 136  --  137  K 4.7  --  4.4  CL 104  --  106  CO2 27  --  25  GLUCOSE 169*  --  123*  BUN 48*  --  66*  CREATININE 2.19*  --  2.60*  CALCIUM 8.9  --  8.2*  MG  --  1.9  --    Liver Function Tests: Recent Labs  Lab 01/20/23 0054  AST 37  ALT 30  ALKPHOS 42  BILITOT 0.5  PROT 8.6*  ALBUMIN 3.4*   No results for input(s): "LIPASE", "AMYLASE" in the last 168 hours. No results for input(s): "AMMONIA" in the last 168 hours. Coagulation Profile: No results for input(s): "INR", "PROTIME" in the last 168 hours. CBC: Recent Labs  Lab 01/20/23 0054 01/21/23 0431  WBC 5.8 4.4  NEUTROABS 4.6  --   HGB 7.9* 6.9*  HCT 25.2* 21.4*  MCV 95.8 95.1  PLT 125* 94*   Cardiac Enzymes: No results for input(s): "CKTOTAL", "CKMB", "CKMBINDEX", "TROPONINI" in the last 168 hours. BNP: Invalid input(s): "POCBNP" CBG: Recent Labs  Lab 01/20/23 0756  GLUCAP 125*   HbA1C: No results for input(s): "HGBA1C" in the last 72 hours. Urine analysis:    Component Value Date/Time   COLORURINE YELLOW 02/07/2021 1550   APPEARANCEUR HAZY (A) 02/07/2021 1550   LABSPEC 1.018 02/07/2021 1550   PHURINE 5.0 02/07/2021 1550   GLUCOSEU NEGATIVE 02/07/2021 1550   HGBUR NEGATIVE 02/07/2021 1550   BILIRUBINUR NEGATIVE 02/07/2021 1550   KETONESUR 5 (A) 02/07/2021 1550   PROTEINUR 30 (A) 02/07/2021 1550   UROBILINOGEN 0.2 06/02/2014 1715   NITRITE NEGATIVE 02/07/2021 1550   LEUKOCYTESUR NEGATIVE 02/07/2021 1550   Sepsis Labs: @LABRCNTIP (procalcitonin:4,lacticidven:4) ) Recent Results (from the past 240 hour(s))  Resp panel by RT-PCR (RSV, Flu A&B, Covid) Anterior Nasal Swab     Status: None   Collection Time: 01/20/23  4:06 AM   Specimen: Anterior Nasal Swab  Result Value Ref Range Status    SARS Coronavirus 2 by RT PCR NEGATIVE NEGATIVE Final    Comment: (NOTE) SARS-CoV-2 target nucleic acids are NOT DETECTED.  The SARS-CoV-2 RNA is generally detectable in upper respiratory specimens during the acute phase of infection. The lowest concentration of SARS-CoV-2 viral copies this assay can detect is 138 copies/mL. A negative result does not preclude SARS-Cov-2 infection and should not be used as the sole basis for treatment or other patient management decisions. A negative result may occur with  improper specimen collection/handling, submission of specimen other than nasopharyngeal swab, presence of viral mutation(s) within the areas targeted by this assay, and inadequate number of viral copies(<138 copies/mL). A negative result must be combined with clinical observations, patient history, and epidemiological information. The expected result is Negative.  Fact Sheet for Patients:  BloggerCourse.com  Fact Sheet for Healthcare Providers:  SeriousBroker.it  This test is no t yet approved or cleared by the [place] and  has been authorized for detection and/or diagnosis of SARS-CoV-2 by FDA under an Emergency Use Authorization (EUA). This EUA will remain  in effect (meaning this test can be used) for the duration of the COVID-19 declaration under Section 564(b)(1) of the Act, 21 U.S.C.section 360bbb-3(b)(1), unless the authorization is terminated  or revoked sooner.       Influenza A by PCR NEGATIVE NEGATIVE Final   Influenza B by PCR NEGATIVE NEGATIVE Final    Comment: (NOTE) The Xpert Xpress SARS-CoV-2/FLU/RSV plus assay is intended as an aid in the diagnosis of influenza from Nasopharyngeal swab specimens and should not be used as a sole basis for treatment. Nasal washings and aspirates are unacceptable for Xpert Xpress SARS-CoV-2/FLU/RSV testing.  Fact Sheet for  Patients: BloggerCourse.com  Fact Sheet for Healthcare Providers: SeriousBroker.it  This test is not yet approved or cleared by the [place] FDA and has been authorized for detection and/or diagnosis of SARS-CoV-2 by FDA under an Emergency Use Authorization (EUA). This EUA will remain in effect (meaning this test can be used) for the duration of the COVID-19 declaration under Section 564(b)(1) of the Act, 21 U.S.C. section 360bbb-3(b)(1), unless the authorization is terminated or revoked.     Resp Syncytial Virus by PCR NEGATIVE NEGATIVE Final    Comment: (NOTE) Fact Sheet for Patients: BloggerCourse.com  Fact Sheet for Healthcare Providers: SeriousBroker.it  This test is not yet approved or cleared by the [place] FDA and has been authorized for detection and/or diagnosis of SARS-CoV-2 by FDA under an Emergency Use Authorization (EUA). This EUA will remain in effect (meaning this test can be used) for the duration of the COVID-19 declaration under Section 564(b)(1) of the Act, 21 U.S.C. section 360bbb-3(b)(1), unless the authorization is terminated or revoked.  Performed at [facility], [address] [place], [place] [address]   Respiratory (~20 pathogens) panel by PCR     Status: None   Collection Time: 01/20/23 10:31 AM   Specimen: Nasopharyngeal Swab; Respiratory  Result Value Ref Range Status   Adenovirus NOT DETECTED NOT DETECTED Final   Coronavirus 229E NOT DETECTED NOT DETECTED Final    Comment: (NOTE) The Coronavirus on the Respiratory Panel, DOES NOT test for the novel  Coronavirus (2019 nCoV)    Coronavirus HKU1 NOT DETECTED NOT DETECTED Final   Coronavirus NL63 NOT DETECTED NOT DETECTED Final   Coronavirus OC43 NOT DETECTED NOT DETECTED Final   Metapneumovirus NOT DETECTED NOT DETECTED Final   Rhinovirus / Enterovirus NOT DETECTED NOT DETECTED Final    Influenza A NOT DETECTED NOT DETECTED Final   Influenza B NOT DETECTED NOT DETECTED Final   Parainfluenza Virus 1 NOT DETECTED NOT DETECTED Final   Parainfluenza Virus 2 NOT DETECTED NOT DETECTED Final   Parainfluenza Virus 3 NOT DETECTED NOT DETECTED Final   Parainfluenza Virus 4 NOT DETECTED NOT DETECTED Final   Respiratory Syncytial Virus NOT DETECTED NOT DETECTED Final   Bordetella pertussis NOT DETECTED NOT DETECTED Final   Bordetella Parapertussis NOT DETECTED NOT DETECTED Final   Chlamydophila pneumoniae NOT DETECTED NOT DETECTED Final   Mycoplasma pneumoniae NOT DETECTED NOT DETECTED Final    Comment: Performed at [facility], [address] [place], [place] [address]  MRSA Next Gen by PCR, Nasal     Status: None   Collection Time: 01/20/23 10:36 AM   Specimen: Nasal Mucosa; Nasal Swab  Result Value  Ref Range Status   MRSA by PCR Next Gen NOT DETECTED NOT DETECTED Final    Comment: (NOTE) The GeneXpert MRSA Assay (FDA approved for NASAL specimens only), is one component of a comprehensive MRSA colonization surveillance program. It is not intended to diagnose MRSA infection nor to guide or monitor treatment for MRSA infections. Test performance is not FDA approved in patients less than 56 years old. Performed at [facility], [address] [place], [place] [address]      Scheduled Meds:  sodium chloride   Intravenous Once   amLODipine  10 mg Oral Daily   arformoterol  15 mcg Nebulization BID   aspirin EC  81 mg Oral Daily   budesonide (PULMICORT) nebulizer solution  0.5 mg Nebulization BID   carvedilol  12.5 mg Oral BID WC   Chlorhexidine Gluconate Cloth  6 each Topical Q0600   clopidogrel  75 mg Oral Daily   heparin  5,000 Units Subcutaneous Q8H   ipratropium-albuterol  3 mL Nebulization Q6H   isosorbide mononitrate  90 mg Oral Daily   methylPREDNISolone (SOLU-MEDROL) injection  60 mg Intravenous Q12H   ranolazine  500 mg Oral BID   rosuvastatin  10 mg  Oral q1800   sodium chloride flush  3 mL Intravenous Q12H   Continuous Infusions:  azithromycin     piperacillin-tazobactam (ZOSYN)  IV 12.5 mL/hr at 01/21/23 0550    Procedures/Studies: DG CHEST PORT 1 VIEW  Result Date: 01/20/2023 CLINICAL DATA:  88 year old female with shortness of breath. Respiratory distress. Non-small cell lung cancer. EXAM: PORTABLE CHEST 1 VIEW COMPARISON:  Chest radiographs 0133 hours today and earlier. FINDINGS: Portable AP semi upright views at 0843 hours. Larger lung volumes. Increased reticulonodular opacity in the left lung superimposed on known left upper lobe mass with associated surgical clips. The left lung base appears spared. The right lung markings appear stable. Mediastinal contours are stable. No pneumothorax or pleural effusion identified. Stable trachea, visible osseous structures. Negative visible bowel gas. IMPRESSION: Acute left lung reticulonodular opacity superimposed on known left upper lobe mass. Left lung base and right lung appear spared. Acute viral/atypical respiratory infection favored over aspiration or asymmetric pulmonary edema. Electronically Signed   By: Odessa Fleming M.D.   On: 01/20/2023 09:15   DG Chest 2 View  Result Date: 01/20/2023 CLINICAL DATA:  Shortness of breath EXAM: CHEST - 2 VIEW COMPARISON:  12/31/2022 FINDINGS: Cardiac shadow is within normal limits. Fiducial markers are again noted in the left apex. Left upper lobe mass lesion is again noted similar to that seen on prior CT. No new focal infiltrate or sizable effusion is seen. No bony abnormality is noted. IMPRESSION: Stable appearing lesion in the left upper lobe with fiducial markers in place. Electronically Signed   By: Alcide Clever M.D.   On: 01/20/2023 01:57   DG Chest Portable 1 View  Result Date: 12/31/2022 CLINICAL DATA:  Shortness of breath and chest pain, initial encounter EXAM: PORTABLE CHEST 1 VIEW COMPARISON:  12/16/2022 FINDINGS: Cardiac shadow is stable.  Persistent left apical mass is noted with fiducial markers in place. Lungs are well aerated. Persistent upper lobe opacities are seen which correspond to ground-glass opacities on recent CT examination. No new focal abnormality is noted. IMPRESSION: Overall stable appearance of the chest when compared with the prior exam. Electronically Signed   By: Alcide Clever M.D.   On: 12/31/2022 20:33    Catarina Hartshorn, DO  [facility]  If 7PM-7AM, please contact night-coverage [email or web address]  Password [email or web address] 01/21/2023, 7:20 AM   LOS: 1 day

## 2023-01-21 NOTE — Evaluation (Signed)
Clinical/Bedside Swallow Evaluation Patient Details  Name: Lisa [name] MRN: [id number] Date of Birth: 08/23/1934  Today's Date: 01/21/2023 Time: SLP Start Time (ACUTE ONLY): 0640 SLP Stop Time (ACUTE ONLY): 0705 SLP Time Calculation (min) (ACUTE ONLY): 25 min  Past Medical History:  Past Medical History:  Diagnosis Date   Anemia    Carotid artery stenosis    CKD (chronic kidney disease)    COPD (chronic obstructive pulmonary disease) (HCC)    Dyspnea    Due to COPD per patient   Goiter diffuse, nontoxic    Per patient   Hepatitis C    History of radiation therapy    Left lung-10/10/21-10/17/21- Dr. Antony Blackbird   Hypertension    Monoclonal gammopathy of unknown significance (MGUS)    Pyelonephritis 04/06/2014   UTI   Past Surgical History:  Past Surgical History:  Procedure Laterality Date   ABDOMINAL HYSTERECTOMY     CHOLECYSTECTOMY N/A 09/17/2018   Procedure: LAPAROSCOPIC CHOLECYSTECTOMY;  Surgeon: Lucretia Roers, MD;  Location: [facility];  Service: General;  Laterality: N/A;   FUDUCIAL PLACEMENT Left 09/01/2021   Procedure: PLACEMENT OF FIDUCIAL MARKERS TIMES FOUR;  Surgeon: Loreli Slot, MD;  Location: [facility] OR;  Service: Thoracic;  Laterality: Left;   TEE WITHOUT CARDIOVERSION N/A 04/10/2014   Procedure: TRANSESOPHAGEAL ECHOCARDIOGRAM (TEE);  Surgeon: Donato Schultz, MD;  Location: [facility] ENDOSCOPY;  Service: Cardiovascular;  Laterality: N/A;   VIDEO BRONCHOSCOPY WITH ENDOBRONCHIAL NAVIGATION N/A 09/01/2021   Procedure: VIDEO BRONCHOSCOPY WITH ENDOBRONCHIAL NAVIGATION;  Surgeon: Loreli Slot, MD;  Location: [facility] OR;  Service: Thoracic;  Laterality: N/A;   HPI:  88 y.o. female with medical history significant for COPD, left lung mass status post radiation, MGUS, hypertension, CKD stage IV, and recent admissions with NSTEMI with patient refusing cardiac catheterization, now presenting from SNF with sob x 1 day. No acute findings on CXR. Pt came off of Bi-PAP  earlier this morning (01/21/2023).    Assessment / Plan / Recommendation  Clinical Impression  Clinical swallowing evaluation completed while Pt was sitting upright in bed. Oral mechanism exam reveals sparse dentition and small/medium sized goiter. Pt has a severe hand tremor and requires feeder assist for all PO. Pt accepted thin liquids, puree textures and regular textures without overt s/sx of aspiration. Pt did require cues to slow down and "take her time". Pt reports "choking" episode from yesterday occured when she attempted to take too many pills at one time. Pt is at increased risk for aspiration secondary to compromised respiratory status and anxiety associated with breathing. Recommend continue with D2/fine chop diet (secondary to dentition) and thin liquids. Small meds are ok whole with liquids, larger pills may need to be dissolved or crushed. Recommend slow rate, alternate bites and sips and encourage Pt to take breaks to breathe to facilitate coordination of breathing and swallowing. Above to RN; no further ST needs noted at this time, our service will sign off. Thank you for this referral, SLP Visit Diagnosis: Dysphagia, unspecified (R13.10)    Aspiration Risk  Mild aspiration risk    Diet Recommendation Dysphagia 2 (Fine chop);Nectar-thick liquid    Liquid Administration via: Cup;Straw Medication Administration: Whole meds with liquid Supervision: Full supervision/cueing for compensatory strategies Compensations: Minimize environmental distractions;Slow rate;Small sips/bites Postural Changes: Seated upright at 90 degrees    Other  Recommendations Oral Care Recommendations: Oral care BID    Recommendations for follow up therapy are one component of a multi-disciplinary discharge planning process, led by the  attending physician.  Recommendations may be updated based on patient status, additional functional criteria and insurance authorization.  Follow up Recommendations No SLP  follow up        Swallow Study   General Date of Onset: 01/20/23 HPI: 88 y.o. female with medical history significant for COPD, left lung mass status post radiation, MGUS, hypertension, CKD stage IV, and recent admissions with NSTEMI with patient refusing cardiac catheterization, now presenting from SNF with sob x 1 day. No acute findings on CXR. Pt came off of Bi-PAP earlier this morning (01/21/2023). Type of Study: Bedside Swallow Evaluation Previous Swallow Assessment: BSE 2023, rec for D2/thin Diet Prior to this Study: Dysphagia 2 (finely chopped);Thin liquids (Level 0) Temperature Spikes Noted: No Respiratory Status: Nasal cannula History of Recent Intubation: No Behavior/Cognition: Alert;Cooperative;Pleasant mood Oral Cavity Assessment: Within Functional Limits Oral Care Completed by SLP: Recent completion by staff Oral Cavity - Dentition: Missing dentition;Poor condition Vision: Functional for self-feeding Self-Feeding Abilities: Total assist Patient Positioning: Upright in bed Baseline Vocal Quality: Normal Volitional Cough: Strong Volitional Swallow: Able to elicit    Oral/Motor/Sensory Function Overall Oral Motor/Sensory Function: Within functional limits   Ice Chips Ice chips: Within functional limits   Thin Liquid Thin Liquid: Within functional limits    Nectar Thick Nectar Thick Liquid: Not tested   Honey Thick Honey Thick Liquid: Not tested   Puree Puree: Within functional limits   Solid     Solid: Within functional limits      H. Romie Levee, CCC-SLP Speech Language Pathologist  Georgetta Haber 01/21/2023,7:15 AM

## 2023-01-21 NOTE — Progress Notes (Signed)
Message to MD Tat regarding pt assessment. Pt found in tripod position on side of bed. Pt very anxious at this time and endorsing Dyspnea. Upon auscultation pt coarse throughout. Reds vest assessment unable to be completed due to low quality read for body habitus. Pt placed back on bipap and much more comfortable at this time. Will continue to monitor.

## 2023-01-22 ENCOUNTER — Ambulatory Visit: Payer: Self-pay | Admitting: *Deleted

## 2023-01-22 DIAGNOSIS — N184 Chronic kidney disease, stage 4 (severe): Secondary | ICD-10-CM | POA: Diagnosis not present

## 2023-01-22 DIAGNOSIS — J9601 Acute respiratory failure with hypoxia: Secondary | ICD-10-CM | POA: Diagnosis not present

## 2023-01-22 DIAGNOSIS — J441 Chronic obstructive pulmonary disease with (acute) exacerbation: Secondary | ICD-10-CM | POA: Diagnosis not present

## 2023-01-22 DIAGNOSIS — D696 Thrombocytopenia, unspecified: Secondary | ICD-10-CM | POA: Diagnosis not present

## 2023-01-22 LAB — BLOOD GAS, VENOUS
Acid-base deficit: 0.1 mmol/L (ref 0.0–2.0)
Bicarbonate: 25.9 mmol/L (ref 20.0–28.0)
Drawn by: 4237
O2 Saturation: 68 %
Patient temperature: 36.4
pCO2, Ven: 47 mmHg (ref 44–60)
pH, Ven: 7.35 (ref 7.25–7.43)
pO2, Ven: 39 mmHg (ref 32–45)

## 2023-01-22 LAB — TROPONIN I (HIGH SENSITIVITY)
Troponin I (High Sensitivity): 164 ng/L (ref <18)
Troponin I (High Sensitivity): 143 ng/L (ref <18)

## 2023-01-22 LAB — GLUCOSE, CAPILLARY: Glucose-Capillary: 158 mg/dL — ABNORMAL HIGH (ref 70–99)

## 2023-01-22 MED ORDER — ALBUTEROL SULFATE (2.5 MG/3ML) 0.083% IN NEBU: 2.5000 mg | INHALATION_SOLUTION | Freq: Three times a day (TID) | RESPIRATORY_TRACT | Status: DC

## 2023-01-22 MED ORDER — AZITHROMYCIN 250 MG PO TABS: 500.0000 mg | ORAL_TABLET | Freq: Every day | ORAL | Status: DC

## 2023-01-22 MED ORDER — REVEFENACIN 175 MCG/3ML IN SOLN: 175.0000 ug | Freq: Every day | RESPIRATORY_TRACT | Status: DC

## 2023-01-22 MED ORDER — AMOXICILLIN-POT CLAVULANATE 500-125 MG PO TABS
1.0000 | ORAL_TABLET | Freq: Two times a day (BID) | ORAL | Status: DC
  Administered 2023-01-22 (×2): 1 via ORAL
  Filled 2023-01-22 (×5): qty 1

## 2023-01-22 MED ORDER — ALPRAZOLAM 0.25 MG PO TABS
0.2500 mg | ORAL_TABLET | Freq: Three times a day (TID) | ORAL | Status: DC | PRN
  Administered 2023-01-22: 0.25 mg via ORAL
  Filled 2023-01-22: qty 1

## 2023-01-22 MED ORDER — HALOPERIDOL LACTATE 5 MG/ML IJ SOLN
3.0000 mg | Freq: Once | INTRAMUSCULAR | Status: AC | PRN
  Administered 2023-01-22: 3 mg via INTRAVENOUS
  Filled 2023-01-22: qty 1

## 2023-01-22 MED ORDER — SODIUM CHLORIDE 0.9 % IV SOLN
2.2500 g | Freq: Three times a day (TID) | INTRAVENOUS | Status: DC
  Filled 2023-01-22 (×4): qty 10

## 2023-01-22 MED ORDER — ALBUTEROL SULFATE (2.5 MG/3ML) 0.083% IN NEBU
2.5000 mg | INHALATION_SOLUTION | Freq: Four times a day (QID) | RESPIRATORY_TRACT | Status: DC
  Administered 2023-01-22: 2.5 mg via RESPIRATORY_TRACT
  Filled 2023-01-22: qty 3

## 2023-01-22 MED ORDER — MIDAZOLAM HCL 2 MG/2ML IJ SOLN
2.0000 mg | Freq: Once | INTRAMUSCULAR | Status: AC | PRN
  Administered 2023-01-22: 2 mg via INTRAVENOUS
  Filled 2023-01-22: qty 2

## 2023-01-22 NOTE — Progress Notes (Addendum)
PROGRESS NOTE  Lisa [name] CZY:[id number] DOB: 22-Feb-1935 DOA: 01/20/2023 PCP: [name], MD  Brief History:   88 y.o. female with medical history significant for COPD, left lung mass status post radiation, MGUS, hypertension, CKD stage IV, and recent admissions with NSTEMI with patient refusing cardiac catheterization, now presenting from SNF with sob x 1 day. Patient reports that she developed shortness of breath and cough yesterday. Shortness of breath became severe overnight and EMS was called. She was found to be saturating 77% on room air. She had also reported chest pain though she denies that now.   ED Course: Upon arrival to the ED, patient is found to be afebrile and saturating mid 90s on 2 L/min of supplemental oxygen with mild tachypnea.  No acute findings noted on chest x-ray.  Labs are most notable for creatinine 2.19, hemoglobin 7.9, and troponin 71.   In the morning of 01/20/2023, the patient had some respiratory distress.  Pulmicort and Brovana were added.  She was continued on DuoNebs and Solu-Medrol.  ABG showed respiratory acidosis and hypercarbia.  The patient was transferred to the stepdown unit and placed on BiPAP.  She remained on BiPAP with brief periods of breaks in between.    Although she has not had any worsening respiratory distress since transfer to SDU, she has required intermittent BiPAP for episodes of shortness of breath.  Her hospitalization has been prolonged due to her slow recovery.  She appears to have very little pulmonary reserve as small amounts of activity have triggered worsening shortness of breath.  Mikael Spray was added to her bronchodilators.  Repeat limited echo was obtained.  Palliative medicine was consulted. I have personally discussed goals of care with the patient and family and they have confirmed that they would not want CPR or intubation.     Assessment/Plan: Acute respiratory failure with hypoxia -Secondary to COPD  exacerbation -placed on BiPAP 8/10>>wean as tolerated -8/10 AVBG 7.16/74/55/26 -8/11 ABG 7.36/44/147/24 -Continue Solu-Medrol -Add Brovana and Pulmicort -Continue DuoNebs -Repeat chest x-ray personally reviewed--increased interstitial marking;  no consolidation -8/12--add Yulpelri   COPD exacerbation -Workup as above -Viral respiratory panel--neg -COVD--neg -xanax 0.25 mg tid prn anxiety -continue azithro -zosyn>>amox/clav   Acute on chronic CKD stage IV -Baseline creatinine 1.8-2.0 -due to diuresis -Monitor BMP   Elevated troponin -Secondary to demand ischemia -Currently without any chest pain -Patient has had history of NSTEMI with troponins up to 2115 -She has refused heart catheterization on numerous occasions -11/16/2022 echo EF 55-60%, grade 2 DD, normal RVEF, mild MR -Continue aspirin, Plavix, statin, carvedilol, Ranexa, Imdur -01/22/23--repeat limited echo   Chronic HFpEF -Appears clinically euvolemic -11/16/2022 echo EF 55-60%, grade 2 DD, normal RVEF, mild MR -received IV lasix x 1 8/10 -reDS--18   Essential hypertension -Continue carvedilol and amlodipine   Anemia of CKD -Hgb down to 6.9 -transfused one unit PRBC -no signs of active bleed   Thrombocytopenia -appear chronic, intermittent since 09/2018 -B12--552 -folate--18.1 -TSH--0.204, free T4--1.07   GOC -extensive discussions with family -discussed conflicting info and goals on MOST form -family confirms no CPR, no intubation -01/22/23--discussed with niece--if no significant improvement clinically in 24-48 hrs, will consider full comfort measures                     Family Communication:  Niece updated 8/12   Consultants:  none   Code Status:  DNR   DVT Prophylaxis:  [place] Heparin  Procedures: As Listed in Progress Note Above   Antibiotics: Azithro 8/10>> Zosyn 8/10>>       Subjective: Patient is sob with minimal activity.  She denies cp, n/v/d, abd  pain.  Objective: Vitals:   01/22/23 1200 01/22/23 1230 01/22/23 1300 01/22/23 1341  BP: (!) 157/52 (!) 157/60 137/64   Pulse: 66 66 64   Resp: 18 19 (!) 22   Temp:      TempSrc:      SpO2: 100% 100% 100% 100%  Weight:      Height:        Intake/Output Summary (Last 24 hours) at 01/22/2023 1446 Last data filed at 01/22/2023 0651 Gross per 24 hour  Intake 149.94 ml  Output 200 ml  Net -50.06 ml   Weight change: -1.6 kg Exam:  General:  Pt is alert, follows commands appropriately, not in acute distress HEENT: No icterus, No thrush, No neck mass, [place]/AT Cardiovascular: RRR, S1/S2, no rubs, no gallops Respiratory: diminished BS.  Bibasilar wheeze Abdomen: Soft/+BS, non tender, non distended, no guarding Extremities: No edema, No lymphangitis, No petechiae, No rashes, no synovitis   Data Reviewed: I have personally reviewed following labs and imaging studies Basic Metabolic Panel: Recent Labs  Lab 01/20/23 0054 01/20/23 0807 01/21/23 0431 01/21/23 0705 01/22/23 0817  NA 136  --  137  --  137  K 4.7  --  4.4  --  4.5  CL 104  --  106  --  104  CO2 27  --  25  --  22  GLUCOSE 169*  --  123*  --  140*  BUN 48*  --  66*  --  89*  CREATININE 2.19*  --  2.60*  --  2.53*  CALCIUM 8.9  --  8.2*  --  8.4*  MG  --  1.9  --  2.1 2.3  PHOS  --   --   --   --  5.6*   Liver Function Tests: Recent Labs  Lab 01/20/23 0054  AST 37  ALT 30  ALKPHOS 42  BILITOT 0.5  PROT 8.6*  ALBUMIN 3.4*   No results for input(s): "LIPASE", "AMYLASE" in the last 168 hours. No results for input(s): "AMMONIA" in the last 168 hours. Coagulation Profile: No results for input(s): "INR", "PROTIME" in the last 168 hours. CBC: Recent Labs  Lab 01/20/23 0054 01/21/23 0431 01/21/23 0705 01/21/23 1335 01/22/23 0817  WBC 5.8 4.4 4.4 5.8 6.8  NEUTROABS 4.6  --   --   --   --   HGB 7.9* 6.9* 6.5* 8.5* 8.9*  HCT 25.2* 21.4* 20.6* 25.0* 26.7*  MCV 95.8 95.1 94.9 92.3 91.8  PLT 125* 94* 101*  93* 99*   Cardiac Enzymes: No results for input(s): "CKTOTAL", "CKMB", "CKMBINDEX", "TROPONINI" in the last 168 hours. BNP: Invalid input(s): "POCBNP" CBG: Recent Labs  Lab 01/20/23 0756 01/22/23 0420  GLUCAP 125* 158*   HbA1C: No results for input(s): "HGBA1C" in the last 72 hours. Urine analysis:    Component Value Date/Time   COLORURINE YELLOW 02/07/2021 1550   APPEARANCEUR HAZY (A) 02/07/2021 1550   LABSPEC 1.018 02/07/2021 1550   PHURINE 5.0 02/07/2021 1550   GLUCOSEU NEGATIVE 02/07/2021 1550   HGBUR NEGATIVE 02/07/2021 1550   BILIRUBINUR NEGATIVE 02/07/2021 1550   KETONESUR 5 (A) 02/07/2021 1550   PROTEINUR 30 (A) 02/07/2021 1550   UROBILINOGEN 0.2 06/02/2014 1715   NITRITE NEGATIVE 02/07/2021 1550   LEUKOCYTESUR NEGATIVE 02/07/2021 1550  Sepsis Labs: @LABRCNTIP (procalcitonin:4,lacticidven:4) ) Recent Results (from the past 240 hour(s))  Resp panel by RT-PCR (RSV, Flu A&B, Covid) Anterior Nasal Swab     Status: None   Collection Time: 01/20/23  4:06 AM   Specimen: Anterior Nasal Swab  Result Value Ref Range Status   SARS Coronavirus 2 by RT PCR NEGATIVE NEGATIVE Final    Comment: (NOTE) SARS-CoV-2 target nucleic acids are NOT DETECTED.  The SARS-CoV-2 RNA is generally detectable in upper respiratory specimens during the acute phase of infection. The lowest concentration of SARS-CoV-2 viral copies this assay can detect is 138 copies/mL. A negative result does not preclude SARS-Cov-2 infection and should not be used as the sole basis for treatment or other patient management decisions. A negative result may occur with  improper specimen collection/handling, submission of specimen other than nasopharyngeal swab, presence of viral mutation(s) within the areas targeted by this assay, and inadequate number of viral copies(<138 copies/mL). A negative result must be combined with clinical observations, patient history, and epidemiological information. The expected  result is Negative.  Fact Sheet for Patients:  BloggerCourse.com  Fact Sheet for Healthcare Providers:  SeriousBroker.it  This test is no t yet approved or cleared by the [place] FDA and  has been authorized for detection and/or diagnosis of SARS-CoV-2 by FDA under an Emergency Use Authorization (EUA). This EUA will remain  in effect (meaning this test can be used) for the duration of the COVID-19 declaration under Section 564(b)(1) of the Act, 21 U.S.C.section 360bbb-3(b)(1), unless the authorization is terminated  or revoked sooner.       Influenza A by PCR NEGATIVE NEGATIVE Final   Influenza B by PCR NEGATIVE NEGATIVE Final    Comment: (NOTE) The Xpert Xpress SARS-CoV-2/FLU/RSV plus assay is intended as an aid in the diagnosis of influenza from Nasopharyngeal swab specimens and should not be used as a sole basis for treatment. Nasal washings and aspirates are unacceptable for Xpert Xpress SARS-CoV-2/FLU/RSV testing.  Fact Sheet for Patients: BloggerCourse.com  Fact Sheet for Healthcare Providers: SeriousBroker.it  This test is not yet approved or cleared by the [place] FDA and has been authorized for detection and/or diagnosis of SARS-CoV-2 by FDA under an Emergency Use Authorization (EUA). This EUA will remain in effect (meaning this test can be used) for the duration of the COVID-19 declaration under Section 564(b)(1) of the Act, 21 U.S.C. section 360bbb-3(b)(1), unless the authorization is terminated or revoked.     Resp Syncytial Virus by PCR NEGATIVE NEGATIVE Final    Comment: (NOTE) Fact Sheet for Patients: BloggerCourse.com  Fact Sheet for Healthcare Providers: SeriousBroker.it  This test is not yet approved or cleared by the [place] FDA and has been authorized for detection and/or diagnosis of  SARS-CoV-2 by FDA under an Emergency Use Authorization (EUA). This EUA will remain in effect (meaning this test can be used) for the duration of the COVID-19 declaration under Section 564(b)(1) of the Act, 21 U.S.C. section 360bbb-3(b)(1), unless the authorization is terminated or revoked.  Performed at [facility], [address] [place], [place] [address]   Respiratory (~20 pathogens) panel by PCR     Status: None   Collection Time: 01/20/23 10:31 AM   Specimen: Nasopharyngeal Swab; Respiratory  Result Value Ref Range Status   Adenovirus NOT DETECTED NOT DETECTED Final   Coronavirus 229E NOT DETECTED NOT DETECTED Final    Comment: (NOTE) The Coronavirus on the Respiratory Panel, DOES NOT test for the novel  Coronavirus (2019 nCoV)  Coronavirus HKU1 NOT DETECTED NOT DETECTED Final   Coronavirus NL63 NOT DETECTED NOT DETECTED Final   Coronavirus OC43 NOT DETECTED NOT DETECTED Final   Metapneumovirus NOT DETECTED NOT DETECTED Final   Rhinovirus / Enterovirus NOT DETECTED NOT DETECTED Final   Influenza A NOT DETECTED NOT DETECTED Final   Influenza B NOT DETECTED NOT DETECTED Final   Parainfluenza Virus 1 NOT DETECTED NOT DETECTED Final   Parainfluenza Virus 2 NOT DETECTED NOT DETECTED Final   Parainfluenza Virus 3 NOT DETECTED NOT DETECTED Final   Parainfluenza Virus 4 NOT DETECTED NOT DETECTED Final   Respiratory Syncytial Virus NOT DETECTED NOT DETECTED Final   Bordetella pertussis NOT DETECTED NOT DETECTED Final   Bordetella Parapertussis NOT DETECTED NOT DETECTED Final   Chlamydophila pneumoniae NOT DETECTED NOT DETECTED Final   Mycoplasma pneumoniae NOT DETECTED NOT DETECTED Final    Comment: Performed at [facility], [address], [place], [place] [address]  MRSA Next Gen by PCR, Nasal     Status: None   Collection Time: 01/20/23 10:36 AM   Specimen: Nasal Mucosa; Nasal Swab  Result Value Ref Range Status   MRSA by PCR Next Gen NOT DETECTED NOT DETECTED Final     Comment: (NOTE) The GeneXpert MRSA Assay (FDA approved for NASAL specimens only), is one component of a comprehensive MRSA colonization surveillance program. It is not intended to diagnose MRSA infection nor to guide or monitor treatment for MRSA infections. Test performance is not FDA approved in patients less than 60 years old. Performed at [facility], [address] [place], [place] [address]      Scheduled Meds:  albuterol  2.5 mg Nebulization Q6H   amLODipine  10 mg Oral Daily   amoxicillin-clavulanate  1 tablet Oral BID   arformoterol  15 mcg Nebulization BID   aspirin EC  81 mg Oral Daily   [START ON 01/23/2023] azithromycin  500 mg Oral Daily   budesonide (PULMICORT) nebulizer solution  0.5 mg Nebulization BID   carvedilol  12.5 mg Oral BID WC   Chlorhexidine Gluconate Cloth  6 each Topical Q0600   clopidogrel  75 mg Oral Daily   heparin  5,000 Units Subcutaneous Q8H   isosorbide mononitrate  90 mg Oral Daily   methylPREDNISolone (SOLU-MEDROL) injection  60 mg Intravenous Q12H   ranolazine  500 mg Oral BID   revefenacin  175 mcg Nebulization Daily   rosuvastatin  10 mg Oral q1800   sodium chloride flush  3 mL Intravenous Q12H   Continuous Infusions:  Procedures/Studies: US Venous Img Upper Uni Left (DVT)  Result Date: 01/21/2023 CLINICAL DATA:  [id number] Edema of left upper arm [id number] EXAM: LEFT UPPER EXTREMITY VENOUS DOPPLER ULTRASOUND TECHNIQUE: Gray-scale sonography with graded compression, as well as color Doppler and duplex ultrasound were performed to evaluate the upper extremity deep venous system from the level of the subclavian vein and including the jugular, axillary, basilic, radial, ulnar and upper cephalic vein. Spectral Doppler was utilized to evaluate flow at rest and with distal augmentation maneuvers. COMPARISON:  12/05/2021 left upper extremity venous Doppler scan FINDINGS: Contralateral Subclavian Vein: Respiratory phasicity is normal and symmetric  with the symptomatic side. No evidence of thrombus. Normal compressibility. Internal Jugular Vein: No evidence of thrombus. Normal compressibility, respiratory phasicity and response to augmentation. Subclavian Vein: No evidence of thrombus. Normal compressibility, respiratory phasicity and response to augmentation. Axillary Vein: No evidence of thrombus. Normal compressibility, respiratory phasicity and response to augmentation. Cephalic Vein: No  evidence of thrombus. Normal compressibility, respiratory phasicity and response to augmentation. Basilic Vein: No evidence of thrombus. Normal compressibility, respiratory phasicity and response to augmentation. Brachial Veins: No evidence of thrombus. Normal compressibility, respiratory phasicity and response to augmentation. Radial Veins: No evidence of thrombus. Normal compressibility, respiratory phasicity and response to augmentation. Ulnar Veins: No evidence of thrombus. Normal compressibility, respiratory phasicity and response to augmentation. Venous Reflux:  None visualized. Other Findings: Diffuse subcutaneous emphysema in the left upper extremity, most prominent in the forearm. IMPRESSION: No evidence of DVT within the left upper extremity. Electronically Signed   By: Delbert Phenix M.D.   On: 01/21/2023 15:31   DG CHEST PORT 1 VIEW  Result Date: 01/21/2023 CLINICAL DATA:  [id number] Acute respiratory failure with hypoxia and hypercarbia (HCC) [id number] EXAM: PORTABLE CHEST - 1 VIEW COMPARISON:  the previous day's study FINDINGS: Metallic seeds in the left lung apex. Some decrease in the regional consolidation. The reticulonodular opacities previously seen in the left mid lung have improved. Right lung remains clear. Heart size normal. Right paratracheal mass with leftward tracheal deviation as before consistent with goiter. Aortic Atherosclerosis (ICD10-170.0). No effusion. Visualized bones unremarkable. IMPRESSION: Improving left upper lobe consolidation.  Electronically Signed   By: Corlis Leak M.D.   On: 01/21/2023 09:59   DG CHEST PORT 1 VIEW  Result Date: 01/20/2023 CLINICAL DATA:  88 year old female with shortness of breath. Respiratory distress. Non-small cell lung cancer. EXAM: PORTABLE CHEST 1 VIEW COMPARISON:  Chest radiographs 0133 hours today and earlier. FINDINGS: Portable AP semi upright views at 0843 hours. Larger lung volumes. Increased reticulonodular opacity in the left lung superimposed on known left upper lobe mass with associated surgical clips. The left lung base appears spared. The right lung markings appear stable. Mediastinal contours are stable. No pneumothorax or pleural effusion identified. Stable trachea, visible osseous structures. Negative visible bowel gas. IMPRESSION: Acute left lung reticulonodular opacity superimposed on known left upper lobe mass. Left lung base and right lung appear spared. Acute viral/atypical respiratory infection favored over aspiration or asymmetric pulmonary edema. Electronically Signed   By: Odessa Fleming M.D.   On: 01/20/2023 09:15   DG Chest 2 View  Result Date: 01/20/2023 CLINICAL DATA:  Shortness of breath EXAM: CHEST - 2 VIEW COMPARISON:  12/31/2022 FINDINGS: Cardiac shadow is within normal limits. Fiducial markers are again noted in the left apex. Left upper lobe mass lesion is again noted similar to that seen on prior CT. No new focal infiltrate or sizable effusion is seen. No bony abnormality is noted. IMPRESSION: Stable appearing lesion in the left upper lobe with fiducial markers in place. Electronically Signed   By: Alcide Clever M.D.   On: 01/20/2023 01:57   DG Chest Portable 1 View  Result Date: 12/31/2022 CLINICAL DATA:  Shortness of breath and chest pain, initial encounter EXAM: PORTABLE CHEST 1 VIEW COMPARISON:  12/16/2022 FINDINGS: Cardiac shadow is stable. Persistent left apical mass is noted with fiducial markers in place. Lungs are well aerated. Persistent upper lobe opacities are seen  which correspond to ground-glass opacities on recent CT examination. No new focal abnormality is noted. IMPRESSION: Overall stable appearance of the chest when compared with the prior exam. Electronically Signed   By: Alcide Clever M.D.   On: 12/31/2022 20:33    Catarina Hartshorn, DO  [facility]  If 7PM-7AM, please contact night-coverage [email or web address] Password [facility] 01/22/2023, 2:46 PM   LOS: 2 days

## 2023-01-22 NOTE — Progress Notes (Signed)
Patient taken off of BiPAP and placed on 2L nasal cannula. Patient is tolerating well at this time. BiPAP remains at bedside when needed.

## 2023-01-22 NOTE — Progress Notes (Signed)
Patient was panicking and taking the BIPAP mask off when this RN got to the patient's room, put her in Nasal Cannula, explained why she is in BIPAP, and about the importance of the BIPAP to her Respiratory problems, settled the patient, helped her calm down, patient satting fine 99% on 2 litres of , other vitals signs WDL, RT made aware, said okay to keep the patient off the BIPAP.

## 2023-01-22 NOTE — Plan of Care (Signed)
  Problem: Education: Goal: Knowledge of disease or condition will improve Outcome: Progressing Goal: Knowledge of the prescribed therapeutic regimen will improve Outcome: Progressing Goal: Individualized Educational Video(s) Outcome: Progressing   Problem: Activity: Goal: Ability to tolerate increased activity will improve Outcome: Progressing Goal: Will verbalize the importance of balancing activity with adequate rest periods Outcome: Progressing   Problem: Respiratory: Goal: Ability to maintain a clear airway will improve Outcome: Progressing Goal: Levels of oxygenation will improve Outcome: Progressing Goal: Ability to maintain adequate ventilation will improve Outcome: Progressing   Problem: Education: Goal: Knowledge of General Education information will improve Description: Including pain rating scale, medication(s)/side effects and non-pharmacologic comfort measures Outcome: Progressing   Problem: Health Behavior/Discharge Planning: Goal: Ability to manage health-related needs will improve Outcome: Progressing   Problem: Clinical Measurements: Goal: Ability to maintain clinical measurements within normal limits will improve Outcome: Progressing Goal: Will remain free from infection Outcome: Progressing Goal: Diagnostic test results will improve Outcome: Progressing Goal: Respiratory complications will improve Outcome: Progressing Goal: Cardiovascular complication will be avoided Outcome: Progressing

## 2023-01-22 NOTE — Patient Outreach (Signed)
  Care Coordination   Follow Up Visit Note   01/22/2023 Name: Lisa [name] MRN: [id number] DOB: 02-02-1935  Lisa [name] Lisa [name] is a 88 y.o. year old female who sees Durwin Nora, Lucina Mellow, MD for primary care. I spoke with  Janan Ridge by phone today.  What matters to the patients health and wellness today?  Niece confirms patient returned to [facility] on 01/20/23 from the skilled nursing facility with difficulty breathing  Diagnosis COPD exacerbation Chest xray showing chronic interstitial markings, Left upper lobe opacity, clinically fluid overload Placed on Bipap and oxygen, IV Zosyn, Solu medrol , duo nebs, IV lasix. 1 unit of pack red blood cells for anemia HGB 6.9  Goals of care re discussed with the family Clarifying no CPD, No intubation /MOST form  Niece received a call from the hospital and will return a call to RN CM  Quick review of EPIC chart indicates patient off Bipap & on 2 L [place] oxygen today   Goals Addressed             This Visit's Progress    [facility] care coordination services (admissions, hypertensive urgency, Respiratory failure))   Not on track    Interventions Today    Flowsheet Row Most Recent Value  Chronic Disease   Chronic disease during today's visit Other, Congestive Heart Failure (CHF), Chronic Obstructive Pulmonary Disease (COPD)  [anemia, goals of care]  General Interventions   General Interventions Discussed/Reviewed General Interventions Reviewed, [facility], Level of Care  Level of Care Skilled Nursing Facility  [confirmed patient to hospital from skilled facility, Review of admission notes indicates goals of care discussed with family and clarified on MOST form]  Mental Health Interventions   Mental Health Discussed/Reviewed Mental Health Reviewed, Coping Strategies              SDOH assessments and interventions completed:  No     Care Coordination Interventions:  Yes, provided   Follow up plan: Follow up call  scheduled for 02/01/23    Encounter Outcome:  Pt. Visit Completed    L. Noelle Penner, RN, BSN, CCM [facility] Care Management Community Coordinator Office number [phone]

## 2023-01-22 NOTE — TOC Initial Note (Signed)
Transition of Care [facility]) - Initial/Assessment Note    Patient Details  Name: Lisa [name] MRN: [id number] Date of Birth: October 04, 1934  Transition of Care [facility]) CM/SW Contact:    Annice Needy, LCSW Phone Number: 01/22/2023, 4:42 PM  Clinical Narrative:                 Per niece, Bronson Ing, patient has been at [facility] for about a week and a half and that patient's wish is to go home. At baseline, patient lives alone. She stated that patient wants to return to her home where she lives alone but realizes that she cannot so she may d/c to her 34 year old brother's home. Patient's niece, Bronson Ing, stated that she spoke with someone named Riley Lam at [facility]. She states that douglas said that patient's [facility] Medicare will pay for in the home as long as it is written in her "plan of care." [facility] assistant will check benefits of to identify if this is possible. TOC advised Bronson Ing that [facility] is typically what the insurance company provided which is not 24/7.          Patient Goals and CMS Choice            Expected Discharge Plan and Services                                              Prior Living Arrangements/Services                       Activities of Daily Living      Permission Sought/Granted                  Emotional Assessment              Admission diagnosis:  Elevated troponin [R79.89] COPD exacerbation (HCC) [J44.1] Dyspnea, unspecified type [R06.00] Patient Active Problem List   Diagnosis Date Noted   Thrombocytopenia (HCC) 01/20/2023   Acute respiratory failure with hypoxia and hypercarbia (HCC) 01/20/2023   Hypertensive emergency 12/31/2022   Acute on chronic anemia 12/17/2022   CAD (coronary artery disease) 12/08/2022   Impaired hearing 11/24/2022   Overgrown toenails 11/24/2022   Goals of care, counseling/discussion 11/24/2022   Acute congestive heart failure (HCC) 11/18/2022   Chronic heart failure with preserved  ejection fraction (HFpEF) (HCC) 11/17/2022   Acute respiratory failure with hypoxia (HCC) 11/15/2022   Acute respiratory distress 11/15/2022   Need for pneumococcal 20-valent conjugate vaccination 10/16/2022   Dysmetria 09/04/2022   Encounter for general adult medical examination with abnormal findings 09/04/2022   COVID-19 12/29/2021   COVID-19 virus infection 12/28/2021   Elevated brain natriuretic peptide (BNP) level 12/28/2021   Elevated troponin 12/28/2021   Chronic kidney disease, stage 3b (HCC) 12/28/2021   FTT (failure to thrive) in adult 12/28/2021   Underweight 12/28/2021   Pain and swelling of forearm, left 12/28/2021   COPD exacerbation (HCC) 12/27/2021   Malignant neoplasm of upper lobe of left lung (HCC) 09/15/2021   Adenocarcinoma of left lung (HCC) 09/11/2021   Multinodular goiter 09/05/2021   Odynophagia 09/03/2021   Mass of upper lobe of left lung 09/03/2021   Hypertensive urgency 09/02/2021   Chest pain 09/02/2021   CKD (chronic kidney disease) stage 4, GFR 15-29 ml/min (HCC) 09/02/2021   Tobacco abuse 09/02/2021   Myeloma (HCC)  09/02/2021   NSTEMI (non-ST elevated myocardial infarction) [facility])    Demand ischemia    Iron deficiency anemia 07/29/2021   Anemia due to stage 4 chronic kidney disease (HCC) 07/22/2021   Chronic hepatitis C (HCC) 07/22/2021   Smoldering multiple myeloma 06/21/2021   Abnormal results of liver function studies 04/19/2021   Calculus of gallbladder without cholecystitis without obstruction    Gallstone pancreatitis 09/13/2018   Protein-calorie malnutrition, severe (HCC) 05/09/2014   Sepsis (HCC) 05/07/2014   Encephalopathy 05/07/2014   Back pain 05/07/2014   Bacteremia    COLD (chronic obstructive lung disease) (HCC)    Viridans streptococci infection    H/O goiter 04/10/2014   Moderate protein-calorie malnutrition (HCC) 04/10/2014   Acute encephalopathy 04/09/2014   Streptococcus viridans infection 04/08/2014   Leukocytosis  04/07/2014   Dehydration 04/07/2014   Fever 04/06/2014   UTI (lower urinary tract infection) 04/06/2014   ARF (acute renal failure) (HCC) 04/06/2014   Acute nontraumatic kidney injury (HCC) 04/06/2014   Hypertension    COPD (chronic obstructive pulmonary disease) (HCC)    PCP:  [name], MD Pharmacy:   [facility] [place], [place] - [address] [place] [address] Phone: [phone] Fax: [phone]     Social Determinants of Health (SDOH) Social History: SDOH Screenings   Food Insecurity: Patient Declined (12/19/2022)  Housing: Low Risk  (12/19/2022)  Transportation Needs: No Transportation Needs (12/19/2022)  Utilities: Not At Risk (12/19/2022)  Alcohol Screen: Low Risk  (10/24/2022)  Depression (PHQ2-9): Low Risk  (12/12/2022)  Financial Resource Strain: Low Risk  (10/24/2022)  Physical Activity: Inactive (10/24/2022)  Social Connections: Moderately Isolated (12/01/2022)  Stress: No Stress Concern Present (12/01/2022)  Tobacco Use: Medium Risk (01/20/2023)   SDOH Interventions:     Readmission Risk Interventions    01/20/2023    2:07 PM 12/17/2022    3:16 PM 11/16/2022    1:13 PM  Readmission Risk Prevention Plan  Transportation Screening Complete  Complete  HRI or Home Care Consult   Complete  Social Work Consult for Recovery Care Planning/Counseling   Complete  Palliative Care Screening   Complete  Medication Review Oceanographer) Complete Complete Complete  PCP or Specialist appointment within 3-5 days of discharge Complete Complete   HRI or Home Care Consult Not Complete    HRI or Home Care Consult Pt Refusal Comments From SNF/Critically Ill    SW Recovery Care/Counseling Consult Complete    Palliative Care Screening Not Applicable    Skilled Nursing Facility Complete

## 2023-01-22 NOTE — Plan of Care (Signed)
  Problem: Acute Rehab Lisa [name] Goals(only Lisa [name] should resolve) Goal: Lisa [name] will Roll Supine to Side Outcome: Progressing Flowsheets (Taken 01/22/2023 1358) Lisa [name] will Roll Supine to Side: with supervision Goal: Lisa [name] Will Go Supine/Side To Sit Outcome: Progressing Flowsheets (Taken 01/22/2023 1358) Lisa [name] will go Supine/Side to Sit:  with supervision  with HOB elevated Goal: Lisa [name] Will Go Sit To Supine/Side Outcome: Progressing Flowsheets (Taken 01/22/2023 1358) Lisa [name] will go Sit to Supine/Side: with supervision Goal: Patient Will Transfer Sit To/From Stand Outcome: Progressing Flowsheets (Taken 01/22/2023 1358) Patient will transfer sit to/from stand: with contact guard assist Goal: Lisa [name] Will Transfer Bed To Chair/Chair To Bed Outcome: Progressing Flowsheets (Taken 01/22/2023 1358) Lisa [name] will Transfer Bed to Chair/Chair to Bed: with contact guard assist Goal: Lisa [name] Will Ambulate Outcome: Progressing Flowsheets (Taken 01/22/2023 1358) Lisa [name] will Ambulate:  15 feet  with rolling walker  with contact guard assist Goal: Lisa [name]/caregiver will Perform Home Exercise Program Outcome: Progressing Flowsheets (Taken 01/22/2023 1358) Lisa [name]/caregiver will Perform Home Exercise Program:  For increased strengthening  For improved balance  With minimal assist    Britta Mccreedy D. Hartnett-Rands, MS, Lisa [name] Per Diem Lisa [name] [facility] [phone] 01/22/2023

## 2023-01-22 NOTE — Progress Notes (Signed)
   01/22/23 1400  ReDS Vest / Clip  Station Marker A  Ruler Value 30  ReDS Value Range < 36  ReDS Actual Value 18  Anatomical Comments sitting up in bed

## 2023-01-22 NOTE — NC FL2 (Signed)
[place] MEDICAID FL2 LEVEL OF CARE FORM     IDENTIFICATION  Patient Name: Lisa [name] Birthdate: 08/27/1934 Sex: female Admission Date (Current Location): 01/20/2023  [place] Number:  [employer] and Address:  [facility],  [address] [place] [address]      Provider Number: [id number]  Attending Physician Name and Address:  Catarina Hartshorn, MD  Relative Name and Phone Number:  [name] (Niece)  [phone]    Current Level of Care: Hospital Recommended Level of Care: Skilled Nursing Facility Prior Approval Number:    Date Approved/Denied:   PASRR Number: [id number]  Discharge Plan: SNF    Current Diagnoses: Patient Active Problem List   Diagnosis Date Noted   Thrombocytopenia (HCC) 01/20/2023   Acute respiratory failure with hypoxia and hypercarbia (HCC) 01/20/2023   Hypertensive emergency 12/31/2022   Acute on chronic anemia 12/17/2022   CAD (coronary artery disease) 12/08/2022   Impaired hearing 11/24/2022   Overgrown toenails 11/24/2022   Goals of care, counseling/discussion 11/24/2022   Acute congestive heart failure (HCC) 11/18/2022   Chronic heart failure with preserved ejection fraction (HFpEF) (HCC) 11/17/2022   Acute respiratory failure with hypoxia (HCC) 11/15/2022   Acute respiratory distress 11/15/2022   Need for pneumococcal 20-valent conjugate vaccination 10/16/2022   Dysmetria 09/04/2022   Encounter for general adult medical examination with abnormal findings 09/04/2022   COVID-19 12/29/2021   COVID-19 virus infection 12/28/2021   Elevated brain natriuretic peptide (BNP) level 12/28/2021   Elevated troponin 12/28/2021   Chronic kidney disease, stage 3b (HCC) 12/28/2021   FTT (failure to thrive) in adult 12/28/2021   Underweight 12/28/2021   Pain and swelling of forearm, left 12/28/2021   COPD exacerbation (HCC) 12/27/2021   Malignant neoplasm of upper lobe of left lung (HCC) 09/15/2021    Adenocarcinoma of left lung (HCC) 09/11/2021   Multinodular goiter 09/05/2021   Odynophagia 09/03/2021   Mass of upper lobe of left lung 09/03/2021   Hypertensive urgency 09/02/2021   Chest pain 09/02/2021   CKD (chronic kidney disease) stage 4, GFR 15-29 ml/min (HCC) 09/02/2021   Tobacco abuse 09/02/2021   Myeloma (HCC) 09/02/2021   NSTEMI (non-ST elevated myocardial infarction) (HCC)    Demand ischemia    Iron deficiency anemia 07/29/2021   Anemia due to stage 4 chronic kidney disease (HCC) 07/22/2021   Chronic hepatitis C (HCC) 07/22/2021   Smoldering multiple myeloma 06/21/2021   Abnormal results of liver function studies 04/19/2021   Calculus of gallbladder without cholecystitis without obstruction    Gallstone pancreatitis 09/13/2018   Protein-calorie malnutrition, severe (HCC) 05/09/2014   Sepsis (HCC) 05/07/2014   Encephalopathy 05/07/2014   Back pain 05/07/2014   Bacteremia    COLD (chronic obstructive lung disease) (HCC)    Viridans streptococci infection    H/O goiter 04/10/2014   Moderate protein-calorie malnutrition (HCC) 04/10/2014   Acute encephalopathy 04/09/2014   Streptococcus viridans infection 04/08/2014   Leukocytosis 04/07/2014   Dehydration 04/07/2014   Fever 04/06/2014   UTI (lower urinary tract infection) 04/06/2014   ARF (acute renal failure) (HCC) 04/06/2014   Acute nontraumatic kidney injury (HCC) 04/06/2014   Hypertension    COPD (chronic obstructive pulmonary disease) (HCC)     Orientation RESPIRATION BLADDER Height & Weight     Self, Time, Place  Normal Incontinent Weight: 102 lb 1.2 oz (46.3 kg) Height:  5\' 7"  (170.2 cm)  BEHAVIORAL SYMPTOMS/MOOD NEUROLOGICAL BOWEL NUTRITION STATUS      Incontinent Diet (  DYS 2)  AMBULATORY STATUS COMMUNICATION OF NEEDS Skin   Limited Assist Verbally Normal                       Personal Care Assistance Level of Assistance  Bathing, Feeding, Dressing   Feeding assistance: Independent Dressing  Assistance: Limited assistance     Functional Limitations Info  Sight, Hearing, Speech Sight Info: Impaired Hearing Info: Adequate Speech Info: Adequate    SPECIAL CARE FACTORS FREQUENCY  PT (By licensed PT), OT (By licensed OT)     PT Frequency: 5x/week OT Frequency: 3x/week            Contractures Contractures Info: Not present    Additional Factors Info  Code Status, Allergies Code Status Info: DNR Allergies Info: NKA           Current Medications (01/22/2023):  This is the current hospital active medication list Current Facility-Administered Medications  Medication Dose Route Frequency Provider Last Rate Last Admin   acetaminophen (TYLENOL) tablet 650 mg  650 mg Oral Q6H PRN Tat, Onalee Hua, MD       Or   acetaminophen (TYLENOL) suppository 650 mg  650 mg Rectal Q6H PRN Tat, Onalee Hua, MD       albuterol (PROVENTIL) (2.5 MG/3ML) 0.083% nebulizer solution 2.5 mg  2.5 mg Nebulization Q2H PRN Tat, David, MD   2.5 mg at 01/22/23 1039   albuterol (PROVENTIL) (2.5 MG/3ML) 0.083% nebulizer solution 2.5 mg  2.5 mg Nebulization Q6H Tat, David, MD       ALPRAZolam Prudy Feeler) tablet 0.25 mg  0.25 mg Oral TID PRN Tat, Onalee Hua, MD       amLODipine (NORVASC) tablet 10 mg  10 mg Oral Daily Tat, Onalee Hua, MD   10 mg at 01/22/23 0821   amoxicillin-clavulanate (AUGMENTIN) 500-125 MG per tablet 1 tablet  1 tablet Oral BID Catarina Hartshorn, MD   1 tablet at 01/22/23 1511   arformoterol (BROVANA) nebulizer solution 15 mcg  15 mcg Nebulization BID Catarina Hartshorn, MD   15 mcg at 01/22/23 4098   aspirin EC tablet 81 mg  81 mg Oral Daily Tat, Onalee Hua, MD   81 mg at 01/22/23 0817   [START ON 01/23/2023] azithromycin (ZITHROMAX) tablet 500 mg  500 mg Oral Daily Tat, David, MD       budesonide (PULMICORT) nebulizer solution 0.5 mg  0.5 mg Nebulization BID Tat, David, MD   0.5 mg at 01/22/23 0730   carvedilol (COREG) tablet 12.5 mg  12.5 mg Oral BID WC Catarina Hartshorn, MD   12.5 mg at 01/22/23 1637   Chlorhexidine Gluconate Cloth  2 % PADS 6 each  6 each Topical J1914 Catarina Hartshorn, MD   6 each at 01/21/23 2215   clopidogrel (PLAVIX) tablet 75 mg  75 mg Oral Daily Tat, Onalee Hua, MD   75 mg at 01/22/23 0819   heparin injection 5,000 Units  5,000 Units Subcutaneous Andres Labrum, MD   5,000 Units at 01/22/23 1511   hydrALAZINE (APRESOLINE) injection 10 mg  10 mg Intravenous Q6H PRN Catarina Hartshorn, MD   10 mg at 01/22/23 0323   isosorbide mononitrate (IMDUR) 24 hr tablet 90 mg  90 mg Oral Daily Tat, Onalee Hua, MD   90 mg at 01/22/23 0818   methylPREDNISolone sodium succinate (SOLU-MEDROL) 125 mg/2 mL injection 60 mg  60 mg Intravenous Pablo Ledger, MD   60 mg at 01/22/23 1638   oxyCODONE (Oxy IR/ROXICODONE) immediate release tablet 2.5 mg  2.5  mg Oral Q4H PRN Catarina Hartshorn, MD   2.5 mg at 01/22/23 0405   ranolazine (RANEXA) 12 hr tablet 500 mg  500 mg Oral BID Tat, Onalee Hua, MD   500 mg at 01/22/23 0818   revefenacin (YUPELRI) nebulizer solution 175 mcg  175 mcg Nebulization Daily Tat, David, MD       rosuvastatin (CRESTOR) tablet 10 mg  10 mg Oral q1800 Tat, Onalee Hua, MD   10 mg at 01/22/23 1637   sodium chloride flush (NS) 0.9 % injection 3 mL  3 mL Intravenous Pablo Ledger, MD   3 mL at 01/21/23 2207   Facility-Administered Medications Ordered in Other Encounters  Medication Dose Route Frequency Provider Last Rate Last Admin   epoetin alfa-epbx (RETACRIT) injection 10,000 Units  10,000 Units Subcutaneous Once Mauro Kaufmann, NP         Discharge Medications: Please see discharge summary for a list of discharge medications.  Relevant Imaging Results:  Relevant Lab Results:   Additional Information SSN: [id number]  Annice Needy, LCSW

## 2023-01-22 NOTE — Evaluation (Signed)
Physical Therapy Evaluation Patient Details Name: Lisa [name] MRN: [id number] DOB: April 30, 1935 Today's Date: 01/22/2023  History of Present Illness  88 y.o. female with medical history significant for COPD, left lung mass status post radiation, MGUS, hypertension, CKD stage IV, and recent admissions with NSTEMI with patient refusing cardiac catheterization, now presenting from SNF with sob x 1 day.  Patient reports that she developed shortness of breath and cough yesterday. Shortness of breath became severe overnight and EMS was called. She was found to be saturating 77% on room air. She had also reported chest pain though she denies that now.      ED Course: Upon arrival to the ED, patient is found to be afebrile and saturating mid 90s on 2 L/min of supplemental oxygen with mild tachypnea.  No acute findings noted on chest x-ray.  Labs are most notable for creatinine 2.19, hemoglobin 7.9, and troponin 71.      In the morning of 01/20/2023, the patient had some respiratory distress.  Pulmicort and Brovana were added.  She was continued on DuoNebs and Solu-Medrol.  ABG showed respiratory acidosis and hypercarbia.  The patient was transferred to the stepdown unit and placed on BiPAP.  She remained on BiPAP with brief periods of breaks in between.  Respiratory status and mental status gradually improved; however, she continued to require intermittent BiPAP.   Clinical Impression  Pt admitted with above diagnosis. Patient on 2 LPM O2 throughout session. Niece present throughout session. Patient performed bed mobility, transfers and ambulation with contact guard assistance. Patient demonstrated ataxic movements in both upper and lower extremities during volitional movements. Patient required cues for hand placement and step sequencing while using RW. Patient also demonstrated slow, labored movements with ambulation mixed with periods of quick ataxic movements increasing her risk for falls. Patient  demonstrated dyspnea on exertion and was limited by fatigue. Pt currently with functional limitations due to the deficits listed below (see PT Problem List). Pt will benefit from acute skilled PT to increase their independence and safety with mobility to allow discharge.           If plan is discharge home, recommend the following: Help with stairs or ramp for entrance;Assist for transportation;Assistance with cooking/housework;A lot of help with walking and/or transfers;A lot of help with bathing/dressing/bathroom   Can travel by private vehicle   Yes    Equipment Recommendations None recommended by PT (to be determined at next venue of care.)  Recommendations for Other Services       Functional Status Assessment Patient has had a recent decline in their functional status and demonstrates the ability to make significant improvements in function in a reasonable and predictable amount of time.     Precautions / Restrictions Precautions Precautions: Fall Restrictions Weight Bearing Restrictions: No      Mobility  Bed Mobility Overal bed mobility: Needs Assistance Bed Mobility: Supine to Sit, Sit to Supine     Supine to sit: Contact guard Sit to supine: Contact guard assist   General bed mobility comments: somewhat quick, impulsive behavior with ataxic movements    Transfers Overall transfer level: Needs assistance Equipment used: Rolling walker (2 wheels) Transfers: Sit to/from Stand Sit to Stand: Contact guard assist           General transfer comment: somewhat quick, impulsive behavior with ataxic movements; cues for hand placement and step sequencing with RW use    Ambulation/Gait Ambulation/Gait assistance: Contact guard assist Gait Distance (Feet): 4 Feet Assistive  device: Rolling walker (2 wheels) Gait Pattern/deviations: Step-to pattern, Decreased step length - right, Decreased step length - left, Decreased stride length, Trunk flexed, Ataxic, Narrow base  of support Gait velocity: decreased     General Gait Details: slow, labored cadence with RW using quick, unsteady ataxic steps along bedside right and left; limited by fatigue and dyspnea on exertion; audible wheezes with exertion; on 2 LPM O2 throughout  Stairs            Wheelchair Mobility     Tilt Bed    Modified Rankin (Stroke Patients Only)       Balance Overall balance assessment: Needs assistance Sitting-balance support: No upper extremity supported, Feet supported Sitting balance-Leahy Scale: Good     Standing balance support: Bilateral upper extremity supported, During functional activity, Reliant on assistive device for balance Standing balance-Leahy Scale: Poor Standing balance comment: using RW                             Pertinent Vitals/Pain Pain Assessment Pain Assessment: No/denies pain Pain Intervention(s): Limited activity within patient's tolerance, Monitored during session, Repositioned    Home Living Family/patient expects to be discharged to:: Skilled nursing facility                        Prior Function                       Extremity/Trunk Assessment   Upper Extremity Assessment RUE Deficits / Details: ataxic quality to movement RUE Coordination: decreased fine motor;decreased gross motor LUE Deficits / Details: ataxic quality to movement LUE Coordination: decreased fine motor;decreased gross motor    Lower Extremity Assessment Lower Extremity Assessment: RLE deficits/detail;LLE deficits/detail RLE Deficits / Details: ataxic quality to movement RLE Coordination: decreased fine motor;decreased gross motor LLE Deficits / Details: ataxic quality to movement LLE Coordination: decreased fine motor;decreased gross motor    Cervical / Trunk Assessment Cervical / Trunk Assessment: Kyphotic  Communication   Communication Communication: No apparent difficulties Cueing Techniques: Verbal cues;Gestural cues   Cognition Arousal: Alert Behavior During Therapy: WFL for tasks assessed/performed Overall Cognitive Status: Within Functional Limits for tasks assessed            General Comments      Exercises     Assessment/Plan    PT Assessment Patient needs continued PT services  PT Problem List Decreased knowledge of use of DME;Decreased activity tolerance;Decreased balance;Decreased mobility;Decreased coordination       PT Treatment Interventions DME instruction;Gait training;Patient/family education;Functional mobility training;Therapeutic activities;Therapeutic exercise;Balance training    PT Goals (Current goals can be found in the Care Plan section)  Acute Rehab PT Goals Patient Stated Goal: get better PT Goal Formulation: With patient/family Time For Goal Achievement: 02/05/23 Potential to Achieve Goals: Fair    Frequency Min 3X/week        AM-PAC PT "6 Clicks" Mobility  Outcome Measure Help needed turning from your back to your side while in a flat bed without using bedrails?: A Little Help needed moving from lying on your back to sitting on the side of a flat bed without using bedrails?: A Little Help needed moving to and from a bed to a chair (including a wheelchair)?: A Lot Help needed standing up from a chair using your arms (e.g., wheelchair or bedside chair)?: A Little Help needed to walk in hospital room?: A Lot Help needed  climbing 3-5 steps with a railing? : A Lot 6 Click Score: 15    End of Session Equipment Utilized During Treatment: Oxygen Activity Tolerance: Patient limited by fatigue;Patient tolerated treatment well Patient left: in bed;with call bell/phone within reach;with bed alarm set Nurse Communication: Mobility status PT Visit Diagnosis: Unsteadiness on feet (R26.81);Other abnormalities of gait and mobility (R26.89);Ataxic gait (R26.0)    Time: 0981-1914 PT Time Calculation (min) (ACUTE ONLY): 28 min   Charges:   PT Evaluation $PT Eval Low  Complexity: 1 Low PT Treatments $Therapeutic Activity: 8-22 mins PT General Charges $$ ACUTE PT VISIT: 1 Visit         Katina Dung. Hartnett-Rands, MS, PT Per Diem PT [facility] [place] [phone]  Britta Mccreedy  Hartnett-Rands 01/22/2023, 1:55 PM

## 2023-01-22 NOTE — Patient Instructions (Signed)
Visit Information  Thank you for taking time to visit with me today. Please don't hesitate to contact me if I can be of assistance to you.   Following are the goals we discussed today:   Goals Addressed             This Visit's Progress    [facility] care coordination services (admissions, hypertensive urgency, Respiratory failure))   Not on track    Interventions Today    Flowsheet Row Most Recent Value  Chronic Disease   Chronic disease during today's visit Other, Congestive Heart Failure (CHF), Chronic Obstructive Pulmonary Disease (COPD)  [anemia, goals of care]  General Interventions   General Interventions Discussed/Reviewed General Interventions Reviewed, [facility], Level of Care  Level of Care Skilled Nursing Facility  [confirmed patient to hospital from skilled facility, Review of admission notes indicates goals of care discussed with family and clarified on MOST form]  Mental Health Interventions   Mental Health Discussed/Reviewed Mental Health Reviewed, Coping Strategies              Our next appointment is by telephone on 02/01/23 at 1 pm  Please call the care guide team at [phone] if you need to cancel or reschedule your appointment.   If you are experiencing a Mental Health or Behavioral Health Crisis or need someone to talk to, please call the Suicide and Crisis Lifeline: 988 call the [place] National Suicide Prevention Lifeline: 586 202 7340 or TTY: (587)434-8279 TTY 636-235-1163) to talk to a trained counselor call 1-800-273-TALK (toll free, 24 hour hotline) call the [facility]: [phone] call 911   No computer access, no preference for copy of AVS       The patient has been provided with contact information for the care management team and has been advised to call with any health related questions or concerns.     L. Noelle Penner, RN, BSN, CCM [facility] Care Management Community Coordinator Office number [phone]

## 2023-01-23 ENCOUNTER — Encounter (HOSPITAL_COMMUNITY): Payer: Self-pay | Admitting: Family Medicine

## 2023-01-23 ENCOUNTER — Inpatient Hospital Stay (HOSPITAL_COMMUNITY): Payer: Medicare Other

## 2023-01-23 DIAGNOSIS — Z515 Encounter for palliative care: Secondary | ICD-10-CM

## 2023-01-23 DIAGNOSIS — J441 Chronic obstructive pulmonary disease with (acute) exacerbation: Secondary | ICD-10-CM | POA: Diagnosis not present

## 2023-01-23 MED ORDER — MORPHINE 100MG IN NS 100ML (1MG/ML) PREMIX INFUSION
2.5000 mg/h | INTRAVENOUS | Status: DC
  Administered 2023-01-23: 2.5 mg/h via INTRAVENOUS
  Filled 2023-01-23: qty 100

## 2023-01-23 MED ORDER — ACETAMINOPHEN 325 MG PO TABS: 650.0000 mg | ORAL_TABLET | Freq: Four times a day (QID) | ORAL | Status: DC | PRN

## 2023-01-23 MED ORDER — MORPHINE SULFATE (CONCENTRATE) 10 MG/0.5ML PO SOLN: 5.0000 mg | ORAL | Status: DC | PRN

## 2023-01-23 MED ORDER — ONDANSETRON 4 MG PO TBDP: 4.0000 mg | ORAL_TABLET | Freq: Four times a day (QID) | ORAL | Status: DC | PRN

## 2023-01-23 MED ORDER — LORAZEPAM 2 MG/ML PO CONC: 1.0000 mg | ORAL | Status: DC | PRN

## 2023-01-23 MED ORDER — GLYCOPYRROLATE 1 MG PO TABS
1.0000 mg | ORAL_TABLET | Freq: Three times a day (TID) | ORAL | Status: DC
  Filled 2023-01-23: qty 1

## 2023-01-23 MED ORDER — ACETAMINOPHEN 650 MG RE SUPP: 650.0000 mg | Freq: Four times a day (QID) | RECTAL | Status: DC | PRN

## 2023-01-23 MED ORDER — GLYCOPYRROLATE 0.2 MG/ML IJ SOLN
0.2000 mg | Freq: Three times a day (TID) | INTRAMUSCULAR | Status: DC
  Administered 2023-01-23: 0.2 mg via INTRAVENOUS
  Filled 2023-01-23: qty 1

## 2023-01-23 MED ORDER — MIDAZOLAM HCL 2 MG/2ML IJ SOLN
2.0000 mg | Freq: Once | INTRAMUSCULAR | Status: DC | PRN
  Filled 2023-01-23: qty 2

## 2023-01-23 MED ORDER — ATROPINE SULFATE 1 % OP SOLN: 4.0000 [drp] | OPHTHALMIC | Status: DC | PRN

## 2023-01-23 MED ORDER — LORAZEPAM 2 MG/ML IJ SOLN
1.0000 mg | INTRAMUSCULAR | Status: DC | PRN
  Administered 2023-01-23: 1 mg via INTRAVENOUS
  Filled 2023-01-23: qty 1

## 2023-01-23 MED ORDER — LORAZEPAM 1 MG PO TABS: 1.0000 mg | ORAL_TABLET | ORAL | Status: DC | PRN

## 2023-01-23 MED ORDER — MORPHINE SULFATE (PF) 2 MG/ML IV SOLN
1.0000 mg | INTRAVENOUS | Status: DC | PRN
  Administered 2023-01-23: 2 mg via INTRAVENOUS
  Filled 2023-01-23: qty 1

## 2023-01-23 MED ORDER — ONDANSETRON HCL 4 MG/2ML IJ SOLN: 4.0000 mg | Freq: Four times a day (QID) | INTRAMUSCULAR | Status: DC | PRN

## 2023-01-23 MED ORDER — ONDANSETRON 4 MG PO TBDP: 4.0000 mg | ORAL_TABLET | Freq: Four times a day (QID) | ORAL | 0 refills | Status: DC | PRN

## 2023-01-23 MED ORDER — BIOTENE DRY MOUTH MT LIQD: 15.0000 mL | OROMUCOSAL | Status: DC | PRN

## 2023-01-23 MED ORDER — MORPHINE BOLUS VIA INFUSION: 1.0000 mg | INTRAVENOUS | Status: DC | PRN

## 2023-01-23 MED ORDER — DIPHENHYDRAMINE HCL 50 MG/ML IJ SOLN: 12.5000 mg | INTRAMUSCULAR | Status: DC | PRN

## 2023-01-23 MED ORDER — MORPHINE SULFATE (PF) 2 MG/ML IV SOLN
1.0000 mg | INTRAVENOUS | Status: DC | PRN
  Administered 2023-01-23: 1 mg via INTRAVENOUS
  Filled 2023-01-23: qty 1

## 2023-01-23 NOTE — Discharge Summary (Signed)
Physician Discharge Summary   Patient: Lisa [name] MRN: [id number] DOB: Dec 30, 1934  Admit date:     01/20/2023  Discharge date: 01/23/23  Discharge Physician: Kendell Bane   PCP: [name], MD   Recommendations at discharge:   Follow-up with hospice care Continue comfort care measures.  Patient and family request  Discharge Diagnoses: Principal Problem:   COPD exacerbation (HCC) Active Problems:   Hypertension   Anemia due to stage 4 chronic kidney disease (HCC)   CKD (chronic kidney disease) stage 4, GFR 15-29 ml/min (HCC)   Chronic heart failure with preserved ejection fraction (HFpEF) (HCC)   Thrombocytopenia (HCC)   Acute respiratory failure with hypoxia and hypercarbia (HCC)  Resolved Problems:   * No resolved hospital problems. *  Hospital Course:  88 y.o. female with medical history significant for COPD, left lung mass status post radiation, MGUS, hypertension, CKD stage IV, and recent admissions with NSTEMI with patient refusing cardiac catheterization, now presenting from SNF with sob x 1 day. Patient reports that she developed shortness of breath and cough yesterday. Shortness of breath became severe overnight and EMS was called. She was found to be saturating 77% on room air. She had also reported chest pain though she denies that now.   ED Course: Upon arrival to the ED, patient is found to be afebrile and saturating mid 90s on 2 L/min of supplemental oxygen with mild tachypnea.  No acute findings noted on chest x-ray.  Labs are most notable for creatinine 2.19, hemoglobin 7.9, and troponin 71.   In the morning of 01/20/2023, the patient had some respiratory distress.  Pulmicort and Brovana were added.  She was continued on DuoNebs and Solu-Medrol.  ABG showed respiratory acidosis and hypercarbia.  The patient was transferred to the stepdown unit and placed on BiPAP.  She remained on BiPAP with brief periods of breaks in between.    Although she has not  had any worsening respiratory distress since transfer to SDU, she has required intermittent BiPAP for episodes of shortness of breath.  Her hospitalization has been prolonged due to her slow recovery.  She appears to have very little pulmonary reserve as small amounts of activity have triggered worsening shortness of breath.  Mikael Spray was added to her bronchodilators.  Repeat limited echo was obtained.  Palliative medicine was consulted. I have personally discussed goals of care with the patient and family and they have confirmed that they would not want CPR or intubation.   Acute respiratory failure with hypoxia And respiratory distress -labored breathing but satting 97% on 2 L -Secondary to COPD exacerbation -placed on BiPAP 8/10>>wean as tolerated -8/10 AVBG 7.16/74/55/26 -8/11 ABG 7.36/44/147/24 -was on  Solu-Medrol, Brovana and Pulmicort, scheduled DuoNebs -Repeat chest x-ray personally reviewed--increased interstitial marking;  no consolidation -8/12--add Yulpelri   COPD exacerbation -Workup as above -Viral respiratory panel--neg -COVD--neg -xanax 0.25 mg tid prn anxiety -continue azithro -zosyn>>amox/clav   Acute on chronic CKD stage IV -Baseline creatinine 1.8-2.0 -due to diuresis Lab Results  Component Value Date   CREATININE 2.53 (H) 01/22/2023   CREATININE 2.60 (H) 01/21/2023   CREATININE 2.19 (H) 01/20/2023      Elevated troponin -Secondary to demand ischemia -Currently without any chest pain -Patient has had history of NSTEMI with troponins up to 2115 -She has refused heart catheterization on numerous occasions -11/16/2022 echo EF 55-60%, grade 2 DD, normal RVEF, mild MR -Continue aspirin, Plavix, statin, carvedilol, Ranexa, Imdur -01/22/23--repeat limited echo   Chronic HFpEF -  Appears clinically euvolemic -11/16/2022 echo EF 55-60%, grade 2 DD, normal RVEF, mild MR -received IV lasix x 1 8/10 -reDS--18   Essential hypertension -Continue carvedilol and  amlodipine   Anemia of CKD -Hgb down to 6.9 -transfused one unit PRBC -no signs of active bleed   Thrombocytopenia -appear chronic, intermittent since 09/2018 -B12--552 -folate--18.1 -TSH--0.204, free T4--1.07   GOC -extensive discussions with family-is at bedside -discussed conflicting info and goals on MOST form -family confirms no CPR, no intubation -01/23/23--discussed with niece-she has consented to full comfort measures Requested hospice              Family Communication:  Niece updated 01/23/2023 Agreed to hospice-hospice home, continue comfort care   Consultants:  none   Code Status:  DNR  Consultants: Active care/hospice  Disposition: Hospice care Diet recommendation:  Discharge Diet Orders (From admission, onward)     Start     Ordered   01/23/23 0000  Diet general        01/23/23 1214           Regular diet DISCHARGE MEDICATION: Allergies as of 01/23/2023   No Known Allergies      Medication List     STOP taking these medications    amLODipine 10 MG tablet Commonly known as: NORVASC   aspirin EC 81 MG tablet   carvedilol 12.5 MG tablet Commonly known as: COREG   clopidogrel 75 MG tablet Commonly known as: PLAVIX   Combivent Respimat 20-100 MCG/ACT Aers respimat Generic drug: Ipratropium-Albuterol   docusate sodium 100 MG capsule Commonly known as: COLACE   feeding supplement Liqd   furosemide 20 MG tablet Commonly known as: LASIX   ipratropium-albuterol 0.5-2.5 (3) MG/3ML Soln Commonly known as: DUONEB   Iron (Ferrous Sulfate) 325 (65 Fe) MG Tabs   isosorbide mononitrate 60 MG 24 hr tablet Commonly known as: IMDUR   multivitamin with minerals Tabs tablet   nitroGLYCERIN 0.4 MG SL tablet Commonly known as: NITROSTAT   ranolazine 500 MG 12 hr tablet Commonly known as: RANEXA   rosuvastatin 20 MG tablet Commonly known as: CRESTOR   tuberculin 5 UNIT/0.1ML injection   TUMS PO   Vitamin D3 125 MCG (5000 UT)  Caps       TAKE these medications    albuterol 108 (90 Base) MCG/ACT inhaler Commonly known as: VENTOLIN HFA Inhale 2 puffs into the lungs every 4 (four) hours as needed for wheezing or shortness of breath (cough).   antiseptic oral rinse Liqd Apply 15 mLs topically as needed for dry mouth.   atropine 1 % ophthalmic solution Place 4 drops under the tongue every 4 (four) hours as needed (excessive secretions).   glycopyrrolate 1 MG tablet Commonly known as: ROBINUL Take 1 tablet (1 mg total) by mouth 3 (three) times daily.   morphine 1 mg/mL Soln infusion Inject 2.5 mg/hr into the vein continuous.   morphine 5 mg/mL Soln Inject 1 mg into the vein every 15 (fifteen) minutes as needed (uncontrolled pain, distress or if respiratory rate is greater than 25).   ondansetron 4 MG disintegrating tablet Commonly known as: ZOFRAN-ODT Take 1 tablet (4 mg total) by mouth every 6 (six) hours as needed for nausea.        Discharge Exam: Filed Weights   01/20/23 1051 01/21/23 0500 01/22/23 0400  Weight: 47.9 kg 46.3 kg 46.3 kg        General:  AAO x 1, awake but lethargic, with obvious shortness of breath,  HEENT:  Normocephalic, PERRL, otherwise with in Normal limits   Neuro:  CNII-XII intact. , normal motor and sensation, reflexes intact   Lungs:   Scattered diffuse wheezing, rhonchi, crackles to lower lobes  Cardio:    S1/S2, RRR, No murmure, No Rubs or Gallops   Abdomen:  Soft, non-tender, bowel sounds active all four quadrants, no guarding or peritoneal signs.  Muscular  skeletal:  Limited exam -severe global generalized weaknesses - in bed, able 2+ pulses,  symmetric, No pitting edema  Skin:  Dry, warm to touch, negative for any Rashes,  Wounds: Please see nursing documentation          Condition at discharge: poor  The results of significant diagnostics from this hospitalization (including imaging, microbiology, ancillary and laboratory) are listed below for  reference.   Imaging Studies: US Venous Img Upper Uni Left (DVT)  Result Date: 01/21/2023 CLINICAL DATA:  [id number] Edema of left upper arm [id number] EXAM: LEFT UPPER EXTREMITY VENOUS DOPPLER ULTRASOUND TECHNIQUE: Gray-scale sonography with graded compression, as well as color Doppler and duplex ultrasound were performed to evaluate the upper extremity deep venous system from the level of the subclavian vein and including the jugular, axillary, basilic, radial, ulnar and upper cephalic vein. Spectral Doppler was utilized to evaluate flow at rest and with distal augmentation maneuvers. COMPARISON:  12/05/2021 left upper extremity venous Doppler scan FINDINGS: Contralateral Subclavian Vein: Respiratory phasicity is normal and symmetric with the symptomatic side. No evidence of thrombus. Normal compressibility. Internal Jugular Vein: No evidence of thrombus. Normal compressibility, respiratory phasicity and response to augmentation. Subclavian Vein: No evidence of thrombus. Normal compressibility, respiratory phasicity and response to augmentation. Axillary Vein: No evidence of thrombus. Normal compressibility, respiratory phasicity and response to augmentation. Cephalic Vein: No evidence of thrombus. Normal compressibility, respiratory phasicity and response to augmentation. Basilic Vein: No evidence of thrombus. Normal compressibility, respiratory phasicity and response to augmentation. Brachial Veins: No evidence of thrombus. Normal compressibility, respiratory phasicity and response to augmentation. Radial Veins: No evidence of thrombus. Normal compressibility, respiratory phasicity and response to augmentation. Ulnar Veins: No evidence of thrombus. Normal compressibility, respiratory phasicity and response to augmentation. Venous Reflux:  None visualized. Other Findings: Diffuse subcutaneous emphysema in the left upper extremity, most prominent in the forearm. IMPRESSION: No evidence of DVT within the left upper  extremity. Electronically Signed   By: Delbert Phenix M.D.   On: 01/21/2023 15:31   DG CHEST PORT 1 VIEW  Result Date: 01/21/2023 CLINICAL DATA:  [id number] Acute respiratory failure with hypoxia and hypercarbia (HCC) [id number] EXAM: PORTABLE CHEST - 1 VIEW COMPARISON:  the previous day's study FINDINGS: Metallic seeds in the left lung apex. Some decrease in the regional consolidation. The reticulonodular opacities previously seen in the left mid lung have improved. Right lung remains clear. Heart size normal. Right paratracheal mass with leftward tracheal deviation as before consistent with goiter. Aortic Atherosclerosis (ICD10-170.0). No effusion. Visualized bones unremarkable. IMPRESSION: Improving left upper lobe consolidation. Electronically Signed   By: Corlis Leak M.D.   On: 01/21/2023 09:59   DG CHEST PORT 1 VIEW  Result Date: 01/20/2023 CLINICAL DATA:  88 year old female with shortness of breath. Respiratory distress. Non-small cell lung cancer. EXAM: PORTABLE CHEST 1 VIEW COMPARISON:  Chest radiographs 0133 hours today and earlier. FINDINGS: Portable AP semi upright views at 0843 hours. Larger lung volumes. Increased reticulonodular opacity in the left lung superimposed on known left upper lobe mass with associated surgical clips. The left lung base appears spared. The  right lung markings appear stable. Mediastinal contours are stable. No pneumothorax or pleural effusion identified. Stable trachea, visible osseous structures. Negative visible bowel gas. IMPRESSION: Acute left lung reticulonodular opacity superimposed on known left upper lobe mass. Left lung base and right lung appear spared. Acute viral/atypical respiratory infection favored over aspiration or asymmetric pulmonary edema. Electronically Signed   By: Odessa Fleming M.D.   On: 01/20/2023 09:15   DG Chest 2 View  Result Date: 01/20/2023 CLINICAL DATA:  Shortness of breath EXAM: CHEST - 2 VIEW COMPARISON:  12/31/2022 FINDINGS: Cardiac shadow is  within normal limits. Fiducial markers are again noted in the left apex. Left upper lobe mass lesion is again noted similar to that seen on prior CT. No new focal infiltrate or sizable effusion is seen. No bony abnormality is noted. IMPRESSION: Stable appearing lesion in the left upper lobe with fiducial markers in place. Electronically Signed   By: Alcide Clever M.D.   On: 01/20/2023 01:57   DG Chest Portable 1 View  Result Date: 12/31/2022 CLINICAL DATA:  Shortness of breath and chest pain, initial encounter EXAM: PORTABLE CHEST 1 VIEW COMPARISON:  12/16/2022 FINDINGS: Cardiac shadow is stable. Persistent left apical mass is noted with fiducial markers in place. Lungs are well aerated. Persistent upper lobe opacities are seen which correspond to ground-glass opacities on recent CT examination. No new focal abnormality is noted. IMPRESSION: Overall stable appearance of the chest when compared with the prior exam. Electronically Signed   By: Alcide Clever M.D.   On: 12/31/2022 20:33    Microbiology: Results for orders placed or performed during the hospital encounter of 01/20/23  Resp panel by RT-PCR (RSV, Flu A&B, Covid) Anterior Nasal Swab     Status: None   Collection Time: 01/20/23  4:06 AM   Specimen: Anterior Nasal Swab  Result Value Ref Range Status   SARS Coronavirus 2 by RT PCR NEGATIVE NEGATIVE Final    Comment: (NOTE) SARS-CoV-2 target nucleic acids are NOT DETECTED.  The SARS-CoV-2 RNA is generally detectable in upper respiratory specimens during the acute phase of infection. The lowest concentration of SARS-CoV-2 viral copies this assay can detect is 138 copies/mL. A negative result does not preclude SARS-Cov-2 infection and should not be used as the sole basis for treatment or other patient management decisions. A negative result may occur with  improper specimen collection/handling, submission of specimen other than nasopharyngeal swab, presence of viral mutation(s) within  the areas targeted by this assay, and inadequate number of viral copies(<138 copies/mL). A negative result must be combined with clinical observations, patient history, and epidemiological information. The expected result is Negative.  Fact Sheet for Patients:  BloggerCourse.com  Fact Sheet for Healthcare Providers:  SeriousBroker.it  This test is no t yet approved or cleared by the [place] FDA and  has been authorized for detection and/or diagnosis of SARS-CoV-2 by FDA under an Emergency Use Authorization (EUA). This EUA will remain  in effect (meaning this test can be used) for the duration of the COVID-19 declaration under Section 564(b)(1) of the Act, 21 U.S.C.section 360bbb-3(b)(1), unless the authorization is terminated  or revoked sooner.       Influenza A by PCR NEGATIVE NEGATIVE Final   Influenza B by PCR NEGATIVE NEGATIVE Final    Comment: (NOTE) The Xpert Xpress SARS-CoV-2/FLU/RSV plus assay is intended as an aid in the diagnosis of influenza from Nasopharyngeal swab specimens and should not be used as a sole basis for treatment. Nasal washings  and aspirates are unacceptable for Xpert Xpress SARS-CoV-2/FLU/RSV testing.  Fact Sheet for Patients: BloggerCourse.com  Fact Sheet for Healthcare Providers: SeriousBroker.it  This test is not yet approved or cleared by the [place] FDA and has been authorized for detection and/or diagnosis of SARS-CoV-2 by FDA under an Emergency Use Authorization (EUA). This EUA will remain in effect (meaning this test can be used) for the duration of the COVID-19 declaration under Section 564(b)(1) of the Act, 21 U.S.C. section 360bbb-3(b)(1), unless the authorization is terminated or revoked.     Resp Syncytial Virus by PCR NEGATIVE NEGATIVE Final    Comment: (NOTE) Fact Sheet for  Patients: BloggerCourse.com  Fact Sheet for Healthcare Providers: SeriousBroker.it  This test is not yet approved or cleared by the [place] FDA and has been authorized for detection and/or diagnosis of SARS-CoV-2 by FDA under an Emergency Use Authorization (EUA). This EUA will remain in effect (meaning this test can be used) for the duration of the COVID-19 declaration under Section 564(b)(1) of the Act, 21 U.S.C. section 360bbb-3(b)(1), unless the authorization is terminated or revoked.  Performed at [facility], [address], [place], [place] [address]   Respiratory (~20 pathogens) panel by PCR     Status: None   Collection Time: 01/20/23 10:31 AM   Specimen: Nasopharyngeal Swab; Respiratory  Result Value Ref Range Status   Adenovirus NOT DETECTED NOT DETECTED Final   Coronavirus 229E NOT DETECTED NOT DETECTED Final    Comment: (NOTE) The Coronavirus on the Respiratory Panel, DOES NOT test for the novel  Coronavirus (2019 nCoV)    Coronavirus HKU1 NOT DETECTED NOT DETECTED Final   Coronavirus NL63 NOT DETECTED NOT DETECTED Final   Coronavirus OC43 NOT DETECTED NOT DETECTED Final   Metapneumovirus NOT DETECTED NOT DETECTED Final   Rhinovirus / Enterovirus NOT DETECTED NOT DETECTED Final   Influenza A NOT DETECTED NOT DETECTED Final   Influenza B NOT DETECTED NOT DETECTED Final   Parainfluenza Virus 1 NOT DETECTED NOT DETECTED Final   Parainfluenza Virus 2 NOT DETECTED NOT DETECTED Final   Parainfluenza Virus 3 NOT DETECTED NOT DETECTED Final   Parainfluenza Virus 4 NOT DETECTED NOT DETECTED Final   Respiratory Syncytial Virus NOT DETECTED NOT DETECTED Final   Bordetella pertussis NOT DETECTED NOT DETECTED Final   Bordetella Parapertussis NOT DETECTED NOT DETECTED Final   Chlamydophila pneumoniae NOT DETECTED NOT DETECTED Final   Mycoplasma pneumoniae NOT DETECTED NOT DETECTED Final    Comment: Performed at [facility], [address], [place], [place] [address]  MRSA Next Gen by PCR, Nasal     Status: None   Collection Time: 01/20/23 10:36 AM   Specimen: Nasal Mucosa; Nasal Swab  Result Value Ref Range Status   MRSA by PCR Next Gen NOT DETECTED NOT DETECTED Final    Comment: (NOTE) The GeneXpert MRSA Assay (FDA approved for NASAL specimens only), is one component of a comprehensive MRSA colonization surveillance program. It is not intended to diagnose MRSA infection nor to guide or monitor treatment for MRSA infections. Test performance is not FDA approved in patients less than 57 years old. Performed at [facility], [address], [place], [place] [address]     Labs: CBC: Recent Labs  Lab 01/20/23 0054 01/21/23 0431 01/21/23 0705 01/21/23 1335 01/22/23 0817  WBC 5.8 4.4 4.4 5.8 6.8  NEUTROABS 4.6  --   --   --   --   HGB 7.9* 6.9* 6.5* 8.5* 8.9*  HCT 25.2* 21.4* 20.6* 25.0* 26.7*  MCV 95.8 95.1 94.9 92.3 91.8  PLT 125* 94* 101* 93* 99*   Basic Metabolic Panel: Recent Labs  Lab 01/20/23 0054 01/20/23 0807 01/21/23 0431 01/21/23 0705 01/22/23 0817  NA 136  --  137  --  137  K 4.7  --  4.4  --  4.5  CL 104  --  106  --  104  CO2 27  --  25  --  22  GLUCOSE 169*  --  123*  --  140*  BUN 48*  --  66*  --  89*  CREATININE 2.19*  --  2.60*  --  2.53*  CALCIUM 8.9  --  8.2*  --  8.4*  MG  --  1.9  --  2.1 2.3  PHOS  --   --   --   --  5.6*   Liver Function Tests: Recent Labs  Lab 01/20/23 0054  AST 37  ALT 30  ALKPHOS 42  BILITOT 0.5  PROT 8.6*  ALBUMIN 3.4*   CBG: Recent Labs  Lab 01/20/23 0756 01/22/23 0420  GLUCAP 125* 158*    Discharge time spent: greater than 30 minutes.  Signed: Kendell Bane, MD [facility] 01/23/2023

## 2023-01-23 NOTE — Progress Notes (Signed)
Report called to Marylene Land at [facility].

## 2023-01-23 NOTE — Plan of Care (Signed)
  Problem: Education: Goal: Knowledge of disease or condition will improve Outcome: Progressing Goal: Knowledge of the prescribed therapeutic regimen will improve Outcome: Progressing   

## 2023-01-23 NOTE — Progress Notes (Addendum)
The patient was seen for respiratory distress. She clearly states that she does not want any more treatments at this time and is ready to pass.   She has had multiple recent admissions with NSTEMI but has consistently refused cardiac cath and is now admitted with COPD exacerbation, acute hypoxic respiratory failure, and AKI superimposed on CKD IV.   Unfortunately, her condition has been worsening despite aggressive treatments and her attending had been discussing GOC with patient and her family. As of yesterday, they agreed that if she failed to improve, full comfort measures would be considered.   The patient is in respiratory distress but alert and fully oriented. She clearly expressed to nursing and then to myself that she does not want any more tests or treatments and is ready to die. She asked that we call her family to let them know. Her brother did not answer the phone but her niece, Lisa [name] was contacted and notified of the patient's decision.   Addendum (01:50): The patient's niece and brother are at the bedside.  They are supportive of the patient's decision and expressed gratitude for the care that she has received here.

## 2023-01-23 NOTE — Progress Notes (Signed)
Palliative: Chart review completed.  [name] has been accepted by [facility] for admission to [facility], residential hospice.  No needs at this time.  DNR/goldenrod form completed and placed on chart.  Conference with attending, bedside nursing staff, transition of care team related to patient condition, needs, goals of care, disposition.  No charge Lisa Carmel, NP Palliative medicine team Team phone [phone] Greater than 50% of this time was spent counseling and coordinating care related to the above assessment and plan.

## 2023-01-23 NOTE — Progress Notes (Signed)
Called to patient's room to give PRN treatment.  Patient had requested to be taken off Bipap and reluctant to go back on.  Patient on 2L with sat of 100% but increased WOB.  Patient stated to RN and this RT that she wanted to die.  Selena Batten, RN to call MD to come and talk with patient and CXR ordered to see if there is any changes.  Will continue to monitor.

## 2023-01-23 NOTE — TOC Transition Note (Signed)
Transition of Care [facility]) - CM/SW Discharge Note   Patient Details  Name: Lisa [name] MRN: [id number] Date of Birth: [birth year]  Transition of Care [facility]) CM/SW Contact:  Elliot Gault, LCSW Phone Number: 01/23/2023, 3:24 PM   Clinical Narrative:     Pt accepted for admission to [facility] today. Family aware and in agreement.   EMS arranged. RN to call report.  No other TOC needs for dc.  Final next level of care: Hospice Medical Facility Barriers to Discharge: Barriers Resolved   Patient Goals and CMS Choice CMS Medicare.gov Compare Post Acute Care list provided to:: Patient Represenative (must comment) Choice offered to / list presented to : [facility] POA / Guardian  Discharge Placement                         Discharge Plan and Services Additional resources added to the After Visit Summary for       Post Acute Care Choice: Hospice                               Social Determinants of Health (SDOH) Interventions SDOH Screenings   Food Insecurity: Patient Declined (12/19/2022)  Housing: Low Risk  (12/19/2022)  Transportation Needs: No Transportation Needs (12/19/2022)  Utilities: Not At Risk (12/19/2022)  Alcohol Screen: Low Risk  (10/24/2022)  Depression (PHQ2-9): Low Risk  (12/12/2022)  Financial Resource Strain: Low Risk  (10/24/2022)  Physical Activity: Inactive (10/24/2022)  Social Connections: Moderately Isolated (12/01/2022)  Stress: No Stress Concern Present (12/01/2022)  Tobacco Use: Medium Risk (01/23/2023)     Readmission Risk Interventions    01/20/2023    2:07 PM 12/17/2022    3:16 PM 11/16/2022    1:13 PM  Readmission Risk Prevention Plan  Transportation Screening Complete  Complete  HRI or Home Care Consult   Complete  Social Work Consult for Recovery Care Planning/Counseling   Complete  Palliative Care Screening   Complete  Medication Review Oceanographer) Complete Complete Complete  PCP or Specialist appointment within 3-5 days of  discharge Complete Complete   HRI or Home Care Consult Not Complete    HRI or Home Care Consult Pt Refusal Comments From SNF/Critically Ill    SW Recovery Care/Counseling Consult Complete    Palliative Care Screening Not Applicable    Skilled Nursing Facility Complete

## 2023-01-23 NOTE — TOC Progression Note (Signed)
Transition of Care [facility]) - Progression Note    Patient Details  Name: Lisa [name] MRN: [id number] Date of Birth: Sep 01, 1934  Transition of Care [facility]) CM/SW Contact  Elliot Gault, LCSW Phone Number: 01/23/2023, 11:27 AM  Clinical Narrative:     TOC following. Per MD, pt voiced to Md and family last night that she would like comfort care.   Spoke with pt's nieces today to review hospice providers and referral options. Referred to [facility] as requested. Keka at [facility] states that they will have an RN evaluate pt this afternoon. Relayed to [facility] that family asks to be updated with expected time for the evaluation so that they can be present.  TOC will follow.  Expected Discharge Plan: Hospice Medical Facility    Expected Discharge Plan and Services     Post Acute Care Choice: Hospice                                         Social Determinants of Health (SDOH) Interventions SDOH Screenings   Food Insecurity: Patient Declined (12/19/2022)  Housing: Low Risk  (12/19/2022)  Transportation Needs: No Transportation Needs (12/19/2022)  Utilities: Not At Risk (12/19/2022)  Alcohol Screen: Low Risk  (10/24/2022)  Depression (PHQ2-9): Low Risk  (12/12/2022)  Financial Resource Strain: Low Risk  (10/24/2022)  Physical Activity: Inactive (10/24/2022)  Social Connections: Moderately Isolated (12/01/2022)  Stress: No Stress Concern Present (12/01/2022)  Tobacco Use: Medium Risk (01/20/2023)    Readmission Risk Interventions    01/20/2023    2:07 PM 12/17/2022    3:16 PM 11/16/2022    1:13 PM  Readmission Risk Prevention Plan  Transportation Screening Complete  Complete  HRI or Home Care Consult   Complete  Social Work Consult for Recovery Care Planning/Counseling   Complete  Palliative Care Screening   Complete  Medication Review Oceanographer) Complete Complete Complete  PCP or Specialist appointment within 3-5 days of discharge Complete Complete   HRI or Home Care  Consult Not Complete    HRI or Home Care Consult Pt Refusal Comments From SNF/Critically Ill    SW Recovery Care/Counseling Consult Complete    Palliative Care Screening Not Applicable    Skilled Nursing Facility Complete

## 2023-01-24 ENCOUNTER — Telehealth: Payer: Self-pay | Admitting: Internal Medicine

## 2023-01-24 NOTE — Telephone Encounter (Signed)
FYI, patient is currently in Hospice

## 2023-01-25 ENCOUNTER — Ambulatory Visit: Payer: Self-pay | Admitting: *Deleted

## 2023-01-25 NOTE — Patient Outreach (Signed)
  Care Coordination   Multidisciplinary Case Review Note    01/25/2023 Name: Lisa [name] MRN: [id number] DOB: 12-14-1934  Lisa [name] is a 88 y.o. year old female who sees Durwin Nora, Lucina Mellow, MD for primary care.  The  multidisciplinary care team met today to review patient care needs and barriers.     Goals Addressed             This Visit's Progress    COMPLETED: [facility] care coordination services (admissions, hypertensive urgency, Respiratory failure))   On track    Interventions Today    Flowsheet Row Most Recent Value  General Interventions   General Interventions Discussed/Reviewed Level of Care, General Interventions Reviewed  Level of Care --  [with chart review noted patient discharged from hospital to [facility] residental facility,  [facility] case closure]              SDOH assessments and interventions completed:  No     Care Coordination Interventions Activated:  No   Care Coordination Interventions:  No, not indicated   Follow up plan: No further intervention required.   Multidisciplinary Team Attendees:   Lisa [name], Lisa [name]  Scribe for Multidisciplinary Case Review:  Cala Bradford

## 2023-01-26 ENCOUNTER — Encounter (HOSPITAL_COMMUNITY): Payer: Self-pay | Admitting: Hematology

## 2023-01-30 ENCOUNTER — Ambulatory Visit: Payer: Medicare Other | Admitting: Neurology

## 2023-02-01 ENCOUNTER — Encounter: Payer: Self-pay | Admitting: *Deleted

## 2023-02-02 ENCOUNTER — Inpatient Hospital Stay: Payer: Medicare Other

## 2023-02-02 ENCOUNTER — Inpatient Hospital Stay: Payer: Medicare Other | Admitting: Oncology

## 2023-02-05 ENCOUNTER — Encounter: Payer: Self-pay | Admitting: *Deleted

## 2023-02-05 ENCOUNTER — Ambulatory Visit: Payer: Self-pay | Admitting: *Deleted

## 2023-02-05 NOTE — Patient Instructions (Signed)
Visit Information  Thank you for taking time to visit with me today. Please don't hesitate to contact me if I can be of assistance to you.   Following are the goals we discussed today:   Goals Addressed             This Visit's Progress    COMPLETED: Find Help in My Community.   On track    Care Coordination Interventions:  Interventions Today    Flowsheet Row Most Recent Value  Chronic Disease   Chronic disease during today's visit Chronic Obstructive Pulmonary Disease (COPD), Chronic Kidney Disease/End Stage Renal Disease (ESRD), Hypertension (HTN), Other  [Tobacco Abuse]  General Interventions   General Interventions Discussed/Reviewed --  [Encouraged]  Labs Hgb A1c annually  [Encouraged]  Vaccines COVID-19, Flu, Pneumonia, RSV, Shingles, Tetanus/Pertussis/Diphtheria  [Encouraged]  Doctor Visits Discussed/Reviewed Doctor Visits Discussed, Doctor Visits Reviewed, Annual Wellness Visits, PCP, Specialist  [Encouraged]  Health Screening Bone Density, Colonoscopy, Mammogram  [Encouraged]  PCP/Specialist Visits Compliance with follow-up visit  [Encouraged]  Communication with PCP/Specialists  [Encouraged]  Level of Care Adult Daycare, Personal Care Services, Applications, Assisted Living, Skilled Nursing Facility  [Encouraged]  Applications Medicaid, Personal Care Services, FL-2  [Encouraged]  Exercise Interventions   Exercise Discussed/Reviewed Exercise Discussed, Assistive device use and maintanence, Exercise Reviewed, Physical Activity, Weight Managment  [Encouraged]  Physical Activity Discussed/Reviewed Physical Activity Discussed, Physical Activity Reviewed, Home Exercise Program (HEP), Types of exercise  [Encouraged]  Weight Management Weight maintenance  [Encouraged]  Education Interventions   Education Provided Provided Therapist, sports, Provided Web-based Education, Provided Education  Provided Verbal Education On Nutrition, Mental Health/Coping with Illness, When to see  the doctor, Foot Care, Eye Care, Applications, Labs, Exercise, Medication, Blood Sugar Monitoring, Programmer, applications, Nurse, mental health, Personal Care Services, FL-2  [Encouraged]  Mental Health Interventions   Mental Health Discussed/Reviewed Mental Health Discussed, Anxiety, Depression, Grief and Loss, Mental Health Reviewed, Substance Abuse, Coping Strategies, Crisis, Other, Suicide  [Domestic Violence]  Nutrition Interventions   Nutrition Discussed/Reviewed Nutrition Discussed, Adding fruits and vegetables, Increaing proteins, Decreasing fats, Decreasing salt, Supplmental nutrition, Decreasing sugar intake, Portion sizes, Fluid intake, Nutrition Reviewed, Carbohydrate meal planning  [Encouraged]  Pharmacy Interventions   Pharmacy Dicussed/Reviewed Pharmacy Topics Discussed, Medications and their functions, Medication Adherence, Pharmacy Topics Reviewed, Affording Medications  [Encouraged]  Safety Interventions   Safety Discussed/Reviewed Safety Discussed, Safety Reviewed, Home Safety  [Encouraged]  Home Safety Assistive Devices, Need for home safety assessment, Refer for community resources  [Encouraged]  Advanced Directive Interventions   Advanced Directives Discussed/Reviewed Advanced Directives Discussed  [Advanced Directives Completed]      Active Listening & Reflection Utilized.  Verbalization of Feelings Encouraged.  Emotional Support Provided. Acceptance & Commitment Therapy Performed. Cognitive Behavioral Therapy Initiated. Client-Centered Therapy Employed. CSW Collaboration with Niece, [name] to Confirm Patient's [facility] [phone]), for End-of-Life Care Services. CSW Collaboration with Niece, [name] to [employer] with CSW [phone]), if She Has Questions, Needs Assistance, or If Additional Social Work Needs Are Identified in The Near  Future.      Please call the care guide team at [phone] if you need to cancel or reschedule your appointment.   If you are experiencing a Mental Health or Behavioral Health Crisis or need someone to talk to, please call the Suicide and Crisis Lifeline: 988 call the [place] National Suicide Prevention Lifeline: 615 484 0682 or TTY: 313-370-5830 TTY 979-858-7807) to  talk to a trained counselor call 1-800-273-TALK (toll free, 24 hour hotline) go to [facility] Urgent [facility] [address] [place] [phone]) call the [facility] Crisis Line: [phone] call 911  Patient verbalizes understanding of instructions and care plan provided today and agrees to view in MyChart. Active MyChart status and patient understanding of how to access instructions and care plan via MyChart confirmed with patient.     No further follow up required.  Danford Bad, BSW, MSW, Printmaker Social Work Case Set designer Health  [facility], Population Health Direct Dial: [phone]  Fax: [phone] Email: [email or web address]: [email or web address]

## 2023-02-05 NOTE — Patient Outreach (Signed)
Care Coordination   Follow Up Visit Note   02/05/2023  Name: Amauria Willcox MRN: [id number] DOB: 08/08/1934  [name] Aide Daponte is a 88 y.o. year old female who sees Durwin Nora, Lucina Mellow, MD for primary care. I spoke with patient's niece, [name] by phone today.  What matters to the patients health and wellness today?  Find Help in My Community.   Goals Addressed             This Visit's Progress    COMPLETED: Find Help in My Community.   On track    Care Coordination Interventions:  Interventions Today    Flowsheet Row Most Recent Value  Chronic Disease   Chronic disease during today's visit Chronic Obstructive Pulmonary Disease (COPD), Chronic Kidney Disease/End Stage Renal Disease (ESRD), Hypertension (HTN), Other  [Tobacco Abuse]  General Interventions   General Interventions Discussed/Reviewed --  [Encouraged]  Labs Hgb A1c annually  [Encouraged]  Vaccines COVID-19, Flu, Pneumonia, RSV, Shingles, Tetanus/Pertussis/Diphtheria  [Encouraged]  Doctor Visits Discussed/Reviewed Doctor Visits Discussed, Doctor Visits Reviewed, Annual Wellness Visits, PCP, Specialist  [Encouraged]  Health Screening Bone Density, Colonoscopy, Mammogram  [Encouraged]  PCP/Specialist Visits Compliance with follow-up visit  [Encouraged]  Communication with PCP/Specialists  [Encouraged]  Level of Care Adult Daycare, Personal Care Services, Applications, Assisted Living, Skilled Nursing Facility  [Encouraged]  Applications Medicaid, Personal Care Services, FL-2  [Encouraged]  Exercise Interventions   Exercise Discussed/Reviewed Exercise Discussed, Assistive device use and maintanence, Exercise Reviewed, Physical Activity, Weight Managment  [Encouraged]  Physical Activity Discussed/Reviewed Physical Activity Discussed, Physical Activity Reviewed, Home Exercise Program (HEP), Types of exercise  [Encouraged]  Weight Management Weight maintenance  [Encouraged]  Education Interventions   Education  Provided Provided Therapist, sports, Provided Web-based Education, Provided Education  Provided Verbal Education On Nutrition, Mental Health/Coping with Illness, When to see the doctor, Foot Care, Eye Care, Applications, Labs, Exercise, Medication, Blood Sugar Monitoring, Programmer, applications, Nurse, mental health, Personal Care Services, FL-2  [Encouraged]  Mental Health Interventions   Mental Health Discussed/Reviewed Mental Health Discussed, Anxiety, Depression, Grief and Loss, Mental Health Reviewed, Substance Abuse, Coping Strategies, Crisis, Other, Suicide  [Domestic Violence]  Nutrition Interventions   Nutrition Discussed/Reviewed Nutrition Discussed, Adding fruits and vegetables, Increaing proteins, Decreasing fats, Decreasing salt, Supplmental nutrition, Decreasing sugar intake, Portion sizes, Fluid intake, Nutrition Reviewed, Carbohydrate meal planning  [Encouraged]  Pharmacy Interventions   Pharmacy Dicussed/Reviewed Pharmacy Topics Discussed, Medications and their functions, Medication Adherence, Pharmacy Topics Reviewed, Affording Medications  [Encouraged]  Safety Interventions   Safety Discussed/Reviewed Safety Discussed, Safety Reviewed, Home Safety  [Encouraged]  Home Safety Assistive Devices, Need for home safety assessment, Refer for community resources  [Encouraged]  Advanced Directive Interventions   Advanced Directives Discussed/Reviewed Advanced Directives Discussed  [Advanced Directives Completed]      Active Listening & Reflection Utilized.  Verbalization of Feelings Encouraged.  Emotional Support Provided. Acceptance & Commitment Therapy Performed. Cognitive Behavioral Therapy Initiated. Client-Centered Therapy Employed. CSW Collaboration with Niece, [name] to Confirm [facility]'s Transition from [facility] [phone]), for End-of-Life Care Services. CSW Collaboration  with Niece, [name] to [employer] with CSW [phone]), if She Has Questions, Needs Assistance, or If Additional Social Work Needs Are Identified in The Near Future.      SDOH assessments and interventions completed:  Yes.  Care Coordination Interventions:  Yes, provided.   Follow up plan: No further intervention required.   Encounter  Outcome:  Pt. Visit Completed.   Danford Bad, BSW, MSW, Printmaker Social Work Case Set designer Health  [facility], Population Health Direct Dial: [phone]  Fax: [phone] Email: [email or web address]: [email or web address]

## 2023-02-06 ENCOUNTER — Ambulatory Visit: Payer: Medicare Other | Admitting: Internal Medicine

## 2023-02-11 DEATH — deceased

## 2023-02-14 ENCOUNTER — Ambulatory Visit: Payer: Medicare Other | Admitting: Internal Medicine

## 2023-03-23 ENCOUNTER — Ambulatory Visit: Payer: Medicare Other | Admitting: Internal Medicine

## 2023-05-30 ENCOUNTER — Encounter (HOSPITAL_COMMUNITY): Payer: Self-pay | Admitting: Hematology

## 2023-11-10 IMAGING — US US RENAL
1 series · 14 of 25 positions shown · non-contrast
Comparison: 04/07/2014

CLINICAL DATA: Acute renal failure on chronic kidney disease.
Thrombocytopenia.

EXAM:
RENAL / URINARY TRACT ULTRASOUND COMPLETE

[Series 1: us renal · 14 of 60 slices shown]
[im 1/60]
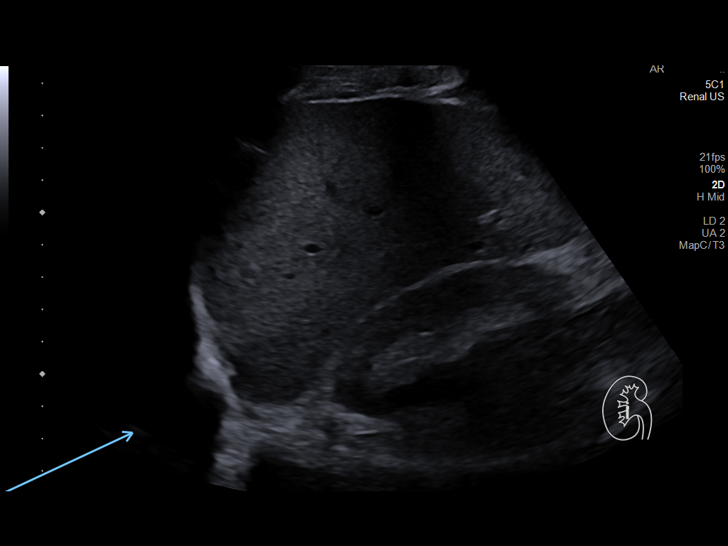
[im 5/60]
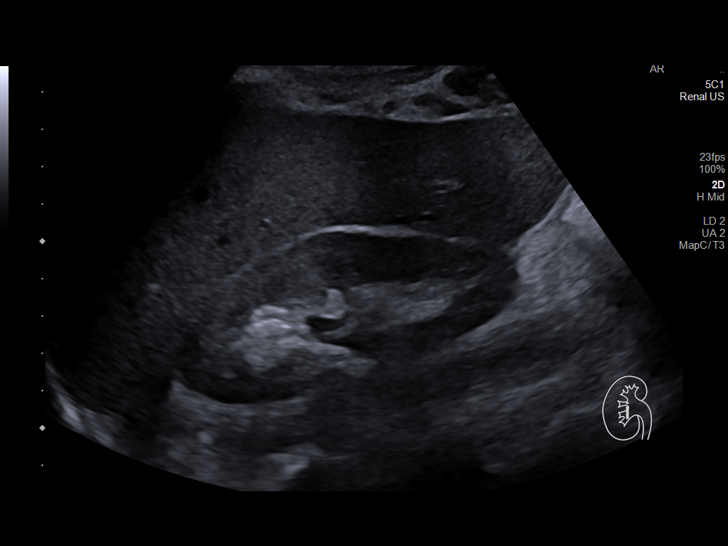
[im 10/60]
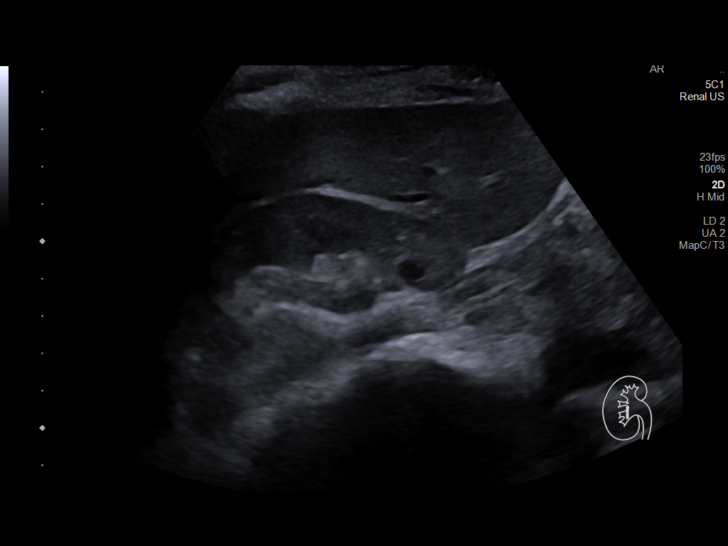
[im 15/60]
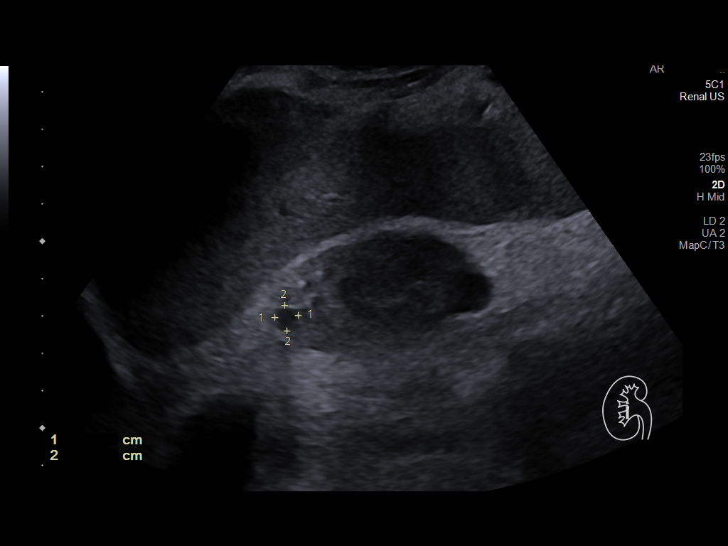
[im 20/60]
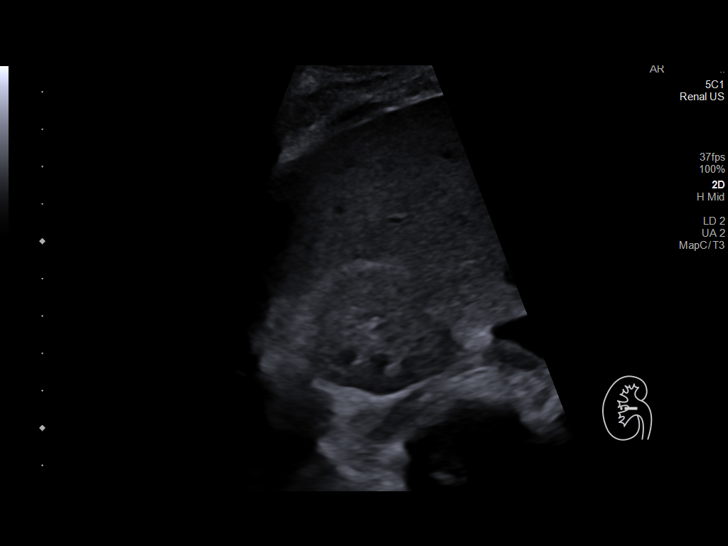
[im 23/60]
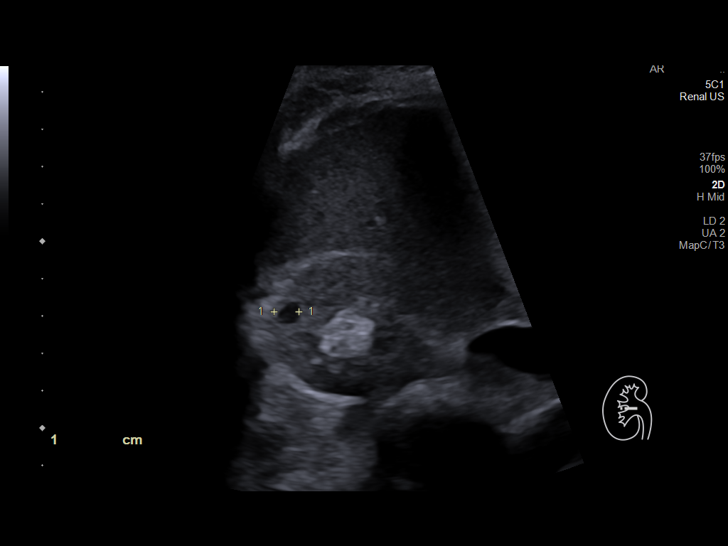
[im 28/60]
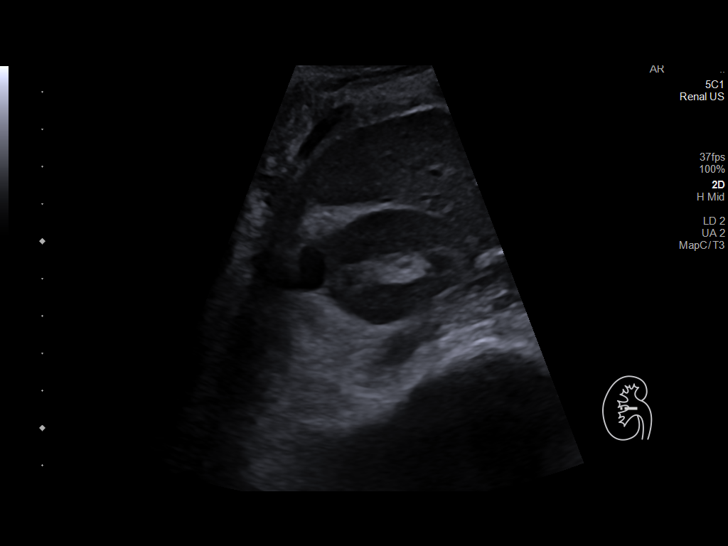
[im 32/60]
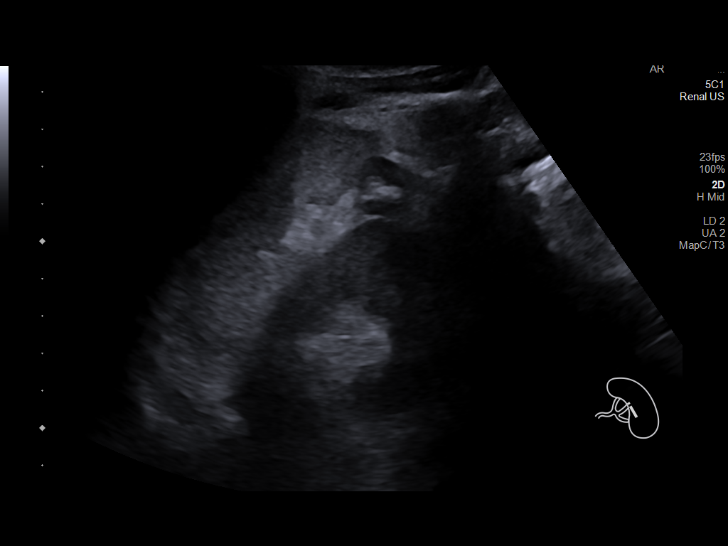
[im 37/60]
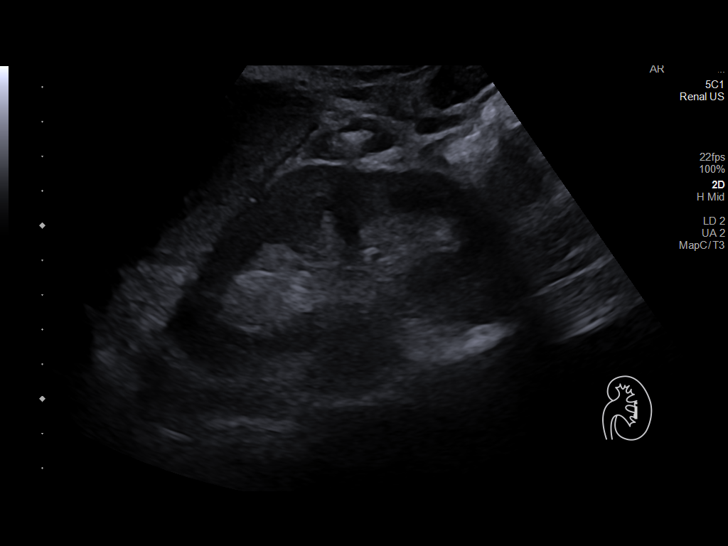
[im 40/60]
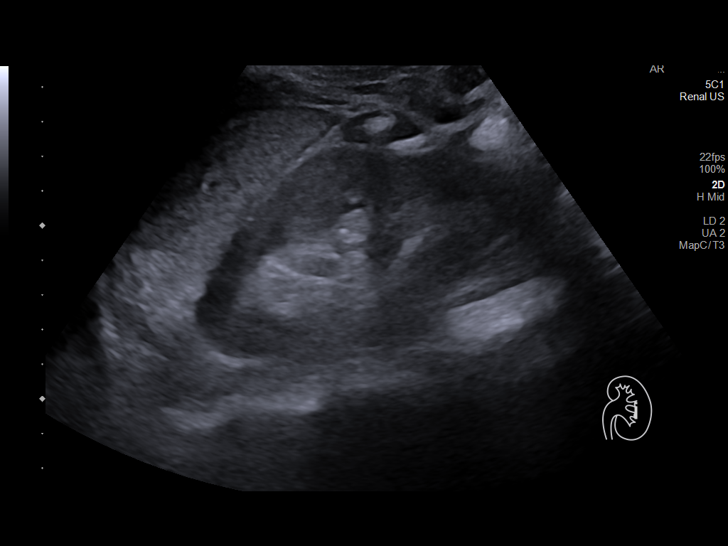
[im 45/60]
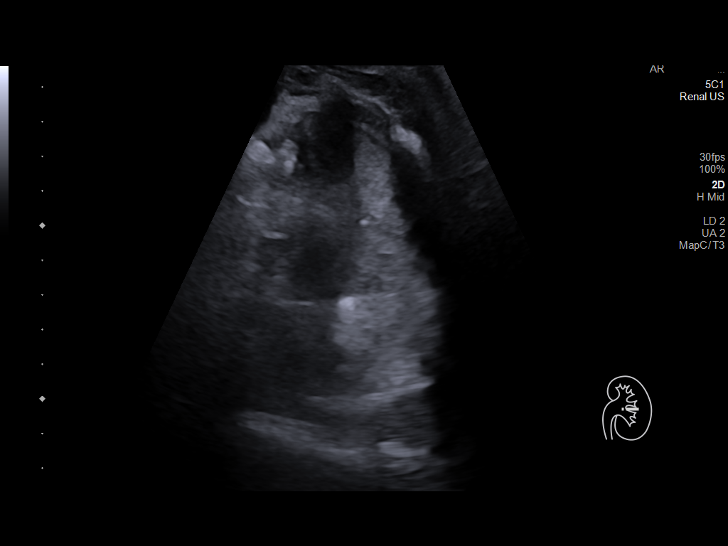
[im 50/60]
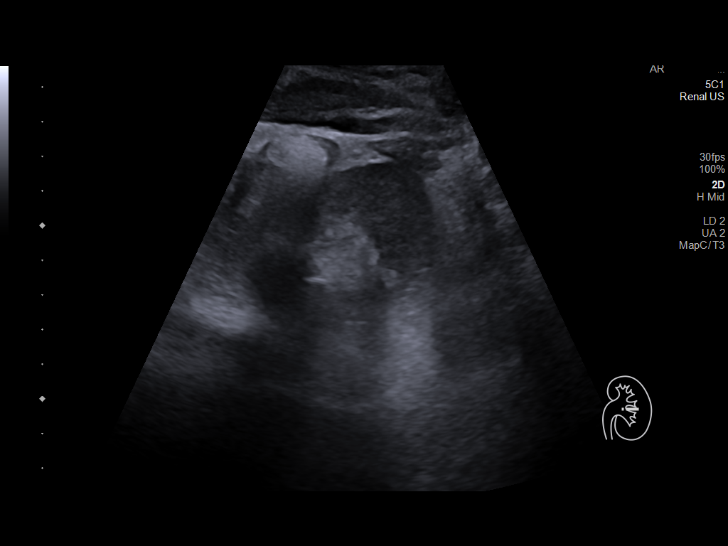
[im 55/60]
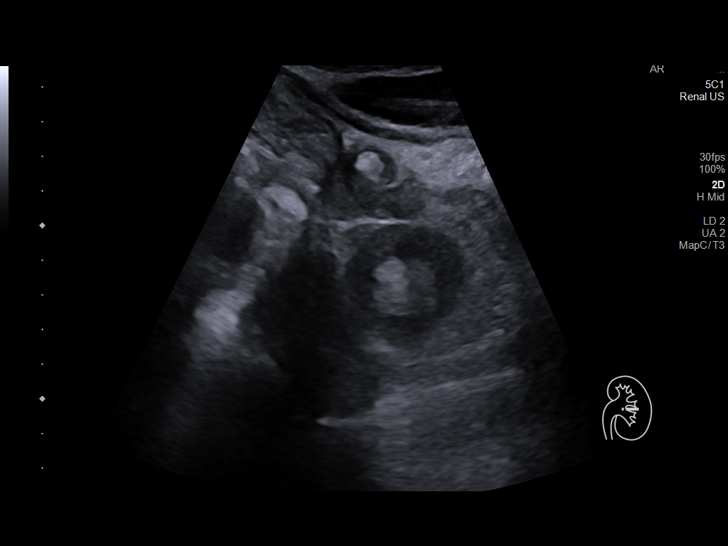
[im 60/60]
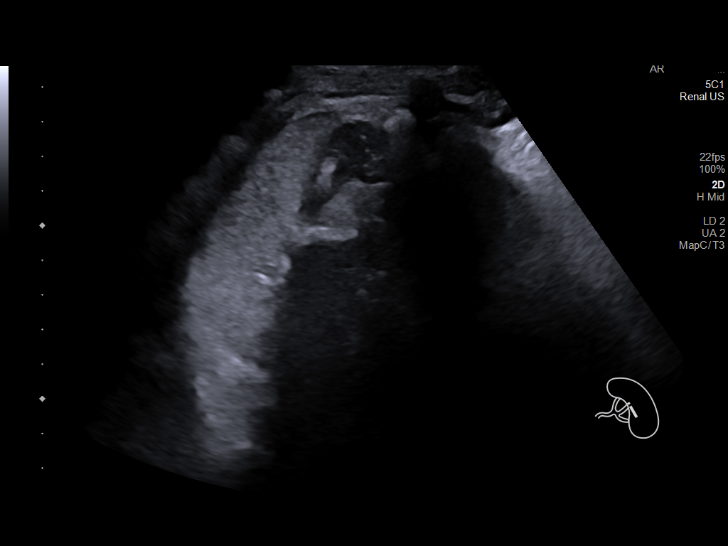

[14 of 25 positions shown; findings below may reference images not displayed]

FINDINGS: Right Kidney:

Renal measurements: 10.1 x 4.4 x 5.0 cm = volume: 115 mL.
Echogenicity within normal limits. No mass or hydronephrosis
visualized. Two small cysts are present with the larger measuring
1.3 cm.

Left Kidney:

Renal measurements: 11.0 x 5.4 x 5.1 cm = volume: 156 mL.
Echogenicity within normal limits. No mass or hydronephrosis
visualized.

Bladder:

Appears normal for degree of bladder distention.

Other:

Right pleural effusion.
IMPRESSION: 1. Normal size kidneys without hydronephrosis. Two small right renal
cysts.

2.  Incidentally noted is a right pleural effusion.

## 2024-06-11 IMAGING — US US EXTREM  UP VENOUS*L*
1 series · 13 of 24 positions shown · non-contrast
Comparison: None Available.

CLINICAL DATA: 86-year-old female with swelling



[Series 1: us venous img upper uni left (dvt) · portal-venous · 13 of 53 slices shown]
[im 1/53]
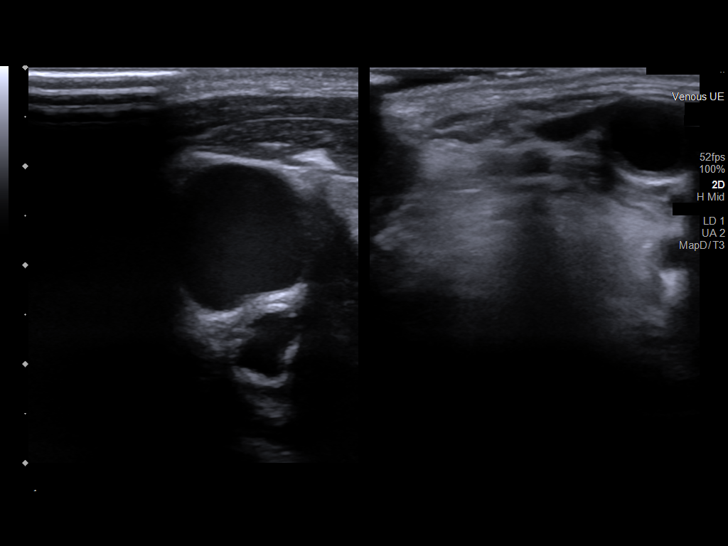
[im 5/53]
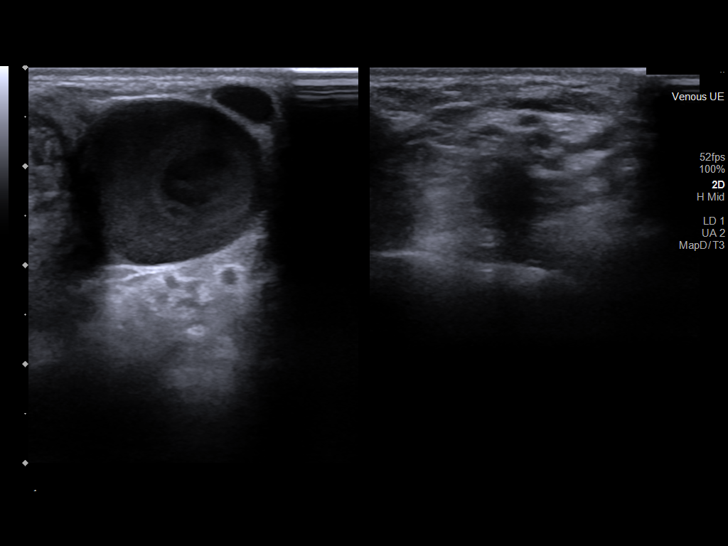
[im 10/53]
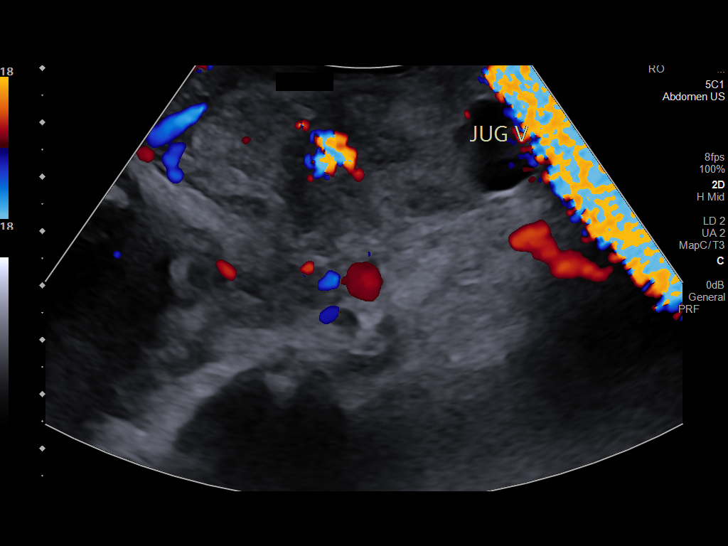
[im 14/53]
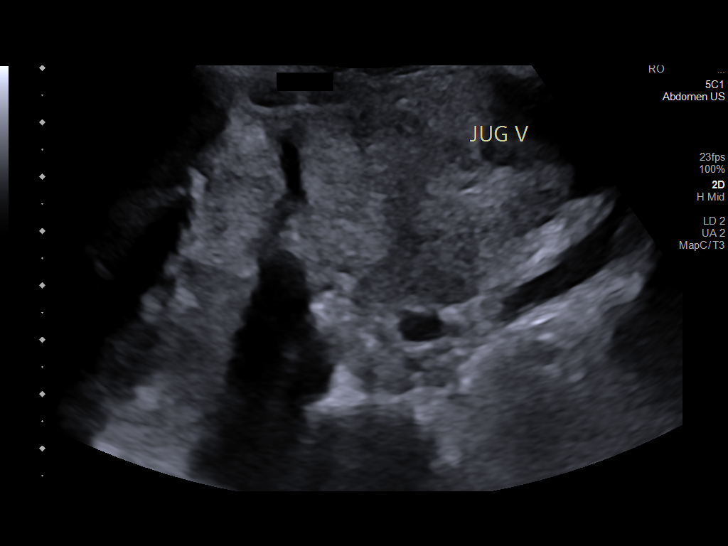
[im 19/53]
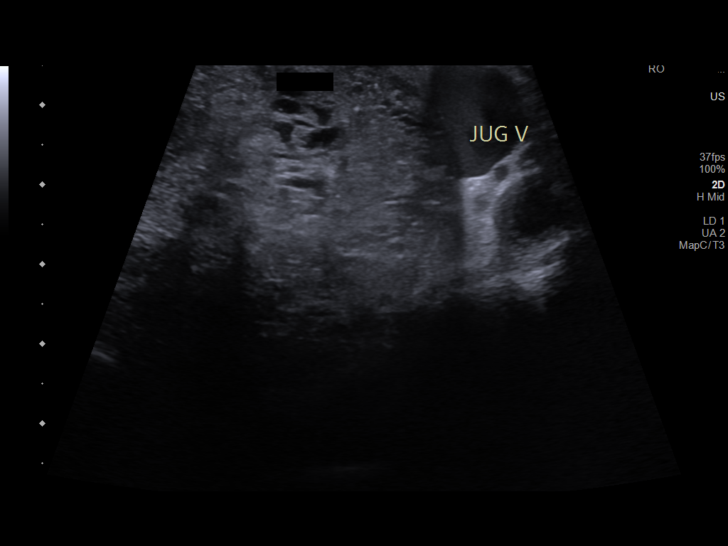
[im 23/53]
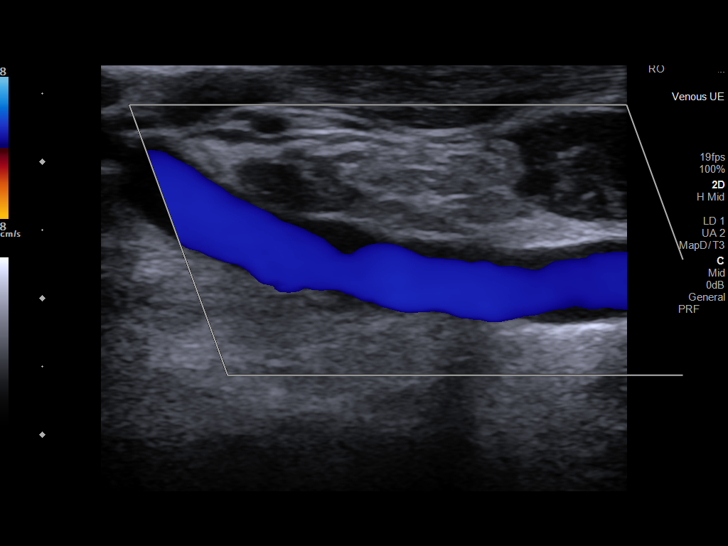
[im 28/53]
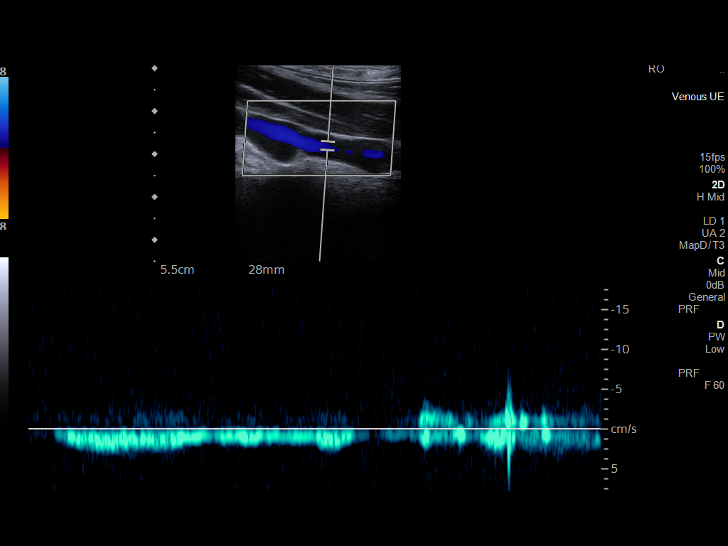
[im 30/53]
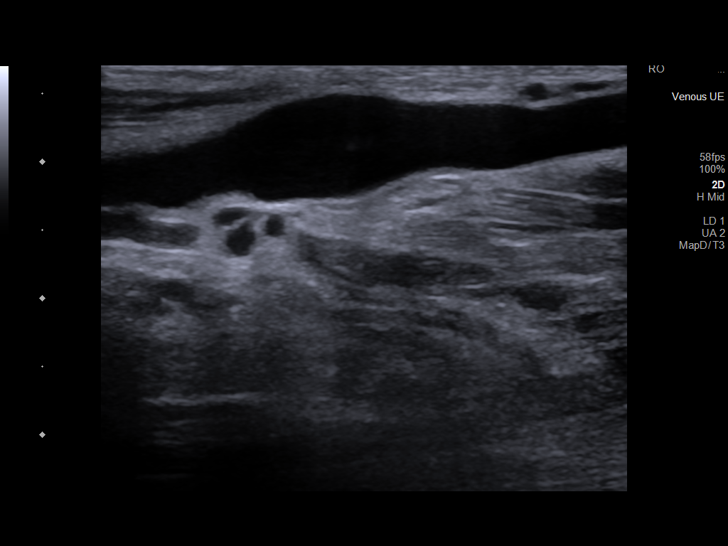
[im 34/53]
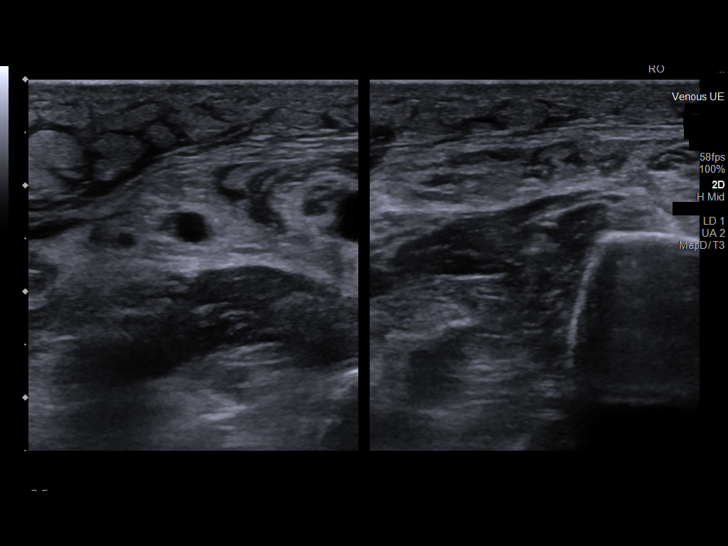
[im 39/53]
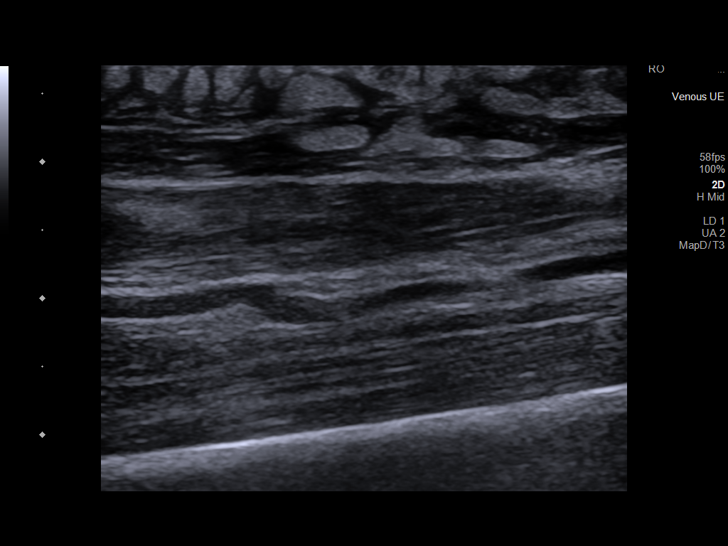
[im 43/53]
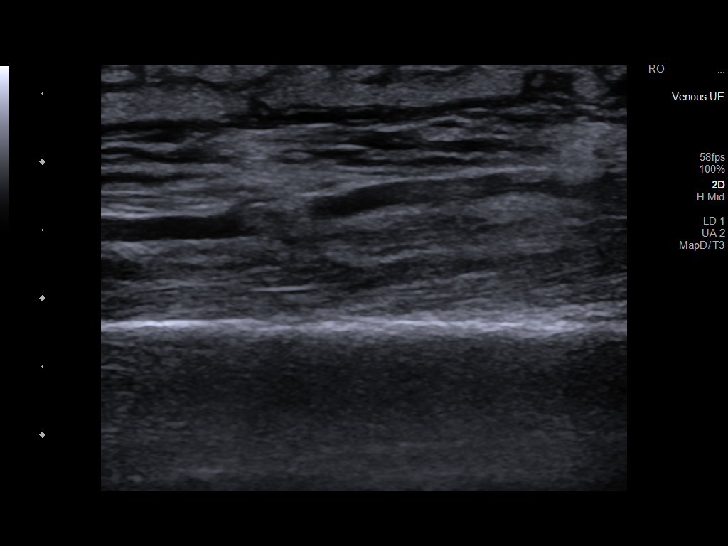
[im 48/53]
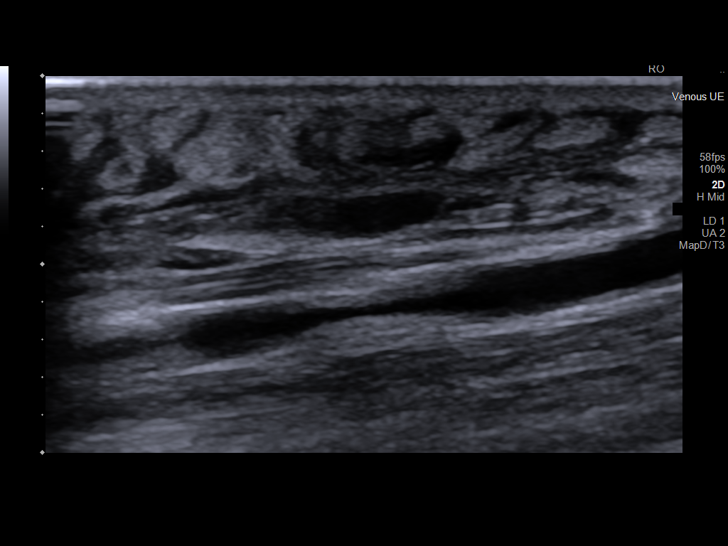
[im 53/53]
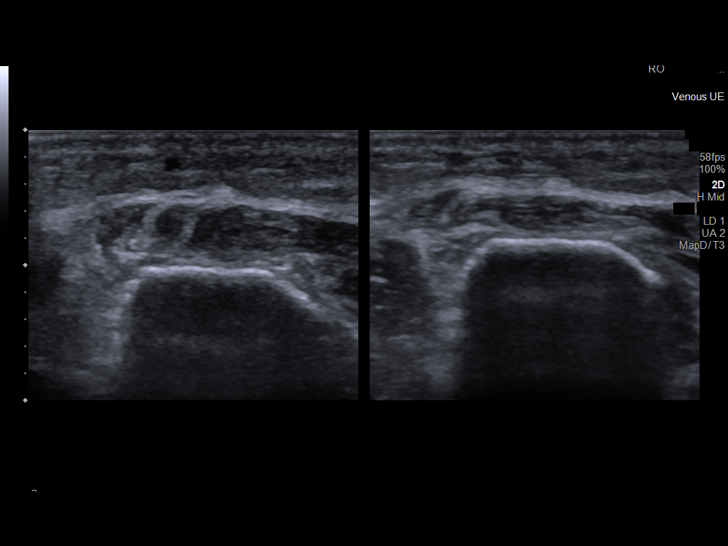

[13 of 24 positions shown; findings below may reference images not displayed]

FINDINGS: Contralateral Subclavian Vein: Respiratory phasicity is normal and
symmetric with the symptomatic side. No evidence of thrombus. Normal
compressibility.

Internal Jugular Vein: No evidence of thrombus. Normal
compressibility, respiratory phasicity and response to augmentation.

Subclavian Vein: No evidence of thrombus. Normal compressibility,
respiratory phasicity and response to augmentation.

Axillary Vein: No evidence of thrombus. Normal compressibility,
respiratory phasicity and response to augmentation.

Cephalic Vein: No evidence of thrombus. Normal compressibility,
respiratory phasicity and response to augmentation.

Basilic Vein: No evidence of thrombus. Normal compressibility,
respiratory phasicity and response to augmentation.

Brachial Veins: No evidence of thrombus. Normal compressibility,
respiratory phasicity and response to augmentation.

Radial Veins: No evidence of thrombus. Normal compressibility,
respiratory phasicity and response to augmentation.

Ulnar Veins: No evidence of thrombus. Normal compressibility,
respiratory phasicity and response to augmentation.

Other Findings:  Redemonstration of thyroid goiter
IMPRESSION: Directed duplex of the left upper extremity negative for DVT.

Redemonstration of thyroid goiter
